# Patient Record
Sex: Female | Born: 1985 | Race: White | Hispanic: No | Marital: Married | Smoking: Current every day smoker
Health system: Southern US, Community
[De-identification: ages and names within clinical notes are randomized; demographics above are authoritative.]

## PROBLEM LIST (undated history)

## (undated) ENCOUNTER — Emergency Department (HOSPITAL_COMMUNITY): Admission: EM | Source: Home / Self Care

## (undated) ENCOUNTER — Inpatient Hospital Stay (HOSPITAL_COMMUNITY): Payer: Self-pay

## (undated) DIAGNOSIS — O09211 Supervision of pregnancy with history of pre-term labor, first trimester: Secondary | ICD-10-CM

## (undated) DIAGNOSIS — K861 Other chronic pancreatitis: Secondary | ICD-10-CM

## (undated) DIAGNOSIS — F1120 Opioid dependence, uncomplicated: Secondary | ICD-10-CM

## (undated) DIAGNOSIS — R109 Unspecified abdominal pain: Secondary | ICD-10-CM

## (undated) DIAGNOSIS — F32A Depression, unspecified: Secondary | ICD-10-CM

## (undated) DIAGNOSIS — R112 Nausea with vomiting, unspecified: Secondary | ICD-10-CM

## (undated) DIAGNOSIS — O4690 Antepartum hemorrhage, unspecified, unspecified trimester: Secondary | ICD-10-CM

## (undated) DIAGNOSIS — Z720 Tobacco use: Secondary | ICD-10-CM

## (undated) DIAGNOSIS — R519 Headache, unspecified: Secondary | ICD-10-CM

## (undated) DIAGNOSIS — K219 Gastro-esophageal reflux disease without esophagitis: Secondary | ICD-10-CM

## (undated) DIAGNOSIS — G8929 Other chronic pain: Secondary | ICD-10-CM

## (undated) DIAGNOSIS — K859 Acute pancreatitis without necrosis or infection, unspecified: Secondary | ICD-10-CM

## (undated) DIAGNOSIS — M869 Osteomyelitis, unspecified: Secondary | ICD-10-CM

## (undated) DIAGNOSIS — I10 Essential (primary) hypertension: Secondary | ICD-10-CM

## (undated) DIAGNOSIS — R51 Headache: Secondary | ICD-10-CM

## (undated) DIAGNOSIS — J189 Pneumonia, unspecified organism: Secondary | ICD-10-CM

## (undated) DIAGNOSIS — F419 Anxiety disorder, unspecified: Secondary | ICD-10-CM

## (undated) DIAGNOSIS — B977 Papillomavirus as the cause of diseases classified elsewhere: Secondary | ICD-10-CM

## (undated) DIAGNOSIS — R87629 Unspecified abnormal cytological findings in specimens from vagina: Secondary | ICD-10-CM

## (undated) DIAGNOSIS — F329 Major depressive disorder, single episode, unspecified: Secondary | ICD-10-CM

## (undated) DIAGNOSIS — D649 Anemia, unspecified: Secondary | ICD-10-CM

## (undated) DIAGNOSIS — K2970 Gastritis, unspecified, without bleeding: Secondary | ICD-10-CM

## (undated) HISTORY — DX: Osteomyelitis, unspecified: M86.9

## (undated) HISTORY — DX: Nausea with vomiting, unspecified: R11.2

## (undated) HISTORY — DX: Tobacco use: Z72.0

## (undated) HISTORY — PX: ANKLE SURGERY: SHX546

## (undated) HISTORY — PX: ORBITAL FRACTURE SURGERY: SHX725

## (undated) HISTORY — DX: Supervision of pregnancy with history of pre-term labor, first trimester: O09.211

## (undated) HISTORY — DX: Other chronic pancreatitis: K86.1

## (undated) HISTORY — DX: Anxiety disorder, unspecified: F41.9

## (undated) HISTORY — PX: KNEE SURGERY: SHX244

## (undated) HISTORY — DX: Unspecified abnormal cytological findings in specimens from vagina: R87.629

## (undated) HISTORY — DX: Acute pancreatitis without necrosis or infection, unspecified: K85.90

## (undated) HISTORY — DX: Antepartum hemorrhage, unspecified, unspecified trimester: O46.90

---
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
## 2007-04-03 ENCOUNTER — Other Ambulatory Visit: Admission: RE | Admit: 2007-04-03 | Discharge: 2007-04-03 | Payer: Self-pay | Admitting: Obstetrics and Gynecology

## 2007-05-10 ENCOUNTER — Other Ambulatory Visit: Admission: RE | Admit: 2007-05-10 | Discharge: 2007-05-10 | Payer: Self-pay | Admitting: Obstetrics and Gynecology

## 2007-08-02 ENCOUNTER — Emergency Department (HOSPITAL_COMMUNITY): Admission: EM | Admit: 2007-08-02 | Discharge: 2007-08-03 | Payer: Self-pay | Admitting: Emergency Medicine

## 2008-02-08 ENCOUNTER — Emergency Department (HOSPITAL_COMMUNITY): Admission: EM | Admit: 2008-02-08 | Discharge: 2008-02-08 | Payer: Self-pay | Admitting: Emergency Medicine

## 2008-02-19 ENCOUNTER — Inpatient Hospital Stay (HOSPITAL_COMMUNITY): Admission: EM | Admit: 2008-02-19 | Discharge: 2008-02-26 | Payer: Self-pay | Admitting: Emergency Medicine

## 2008-02-21 ENCOUNTER — Ambulatory Visit: Payer: Self-pay | Admitting: Physical Medicine & Rehabilitation

## 2008-03-16 ENCOUNTER — Inpatient Hospital Stay (HOSPITAL_COMMUNITY): Admission: EM | Admit: 2008-03-16 | Discharge: 2008-03-20 | Payer: Self-pay | Admitting: Emergency Medicine

## 2008-03-17 ENCOUNTER — Ambulatory Visit: Payer: Self-pay | Admitting: Internal Medicine

## 2008-04-05 ENCOUNTER — Inpatient Hospital Stay (HOSPITAL_COMMUNITY): Admission: RE | Admit: 2008-04-05 | Discharge: 2008-04-10 | Payer: Self-pay | Admitting: Orthopedic Surgery

## 2008-04-14 ENCOUNTER — Inpatient Hospital Stay (HOSPITAL_COMMUNITY): Admission: EM | Admit: 2008-04-14 | Discharge: 2008-04-18 | Payer: Self-pay | Admitting: Emergency Medicine

## 2008-05-14 ENCOUNTER — Ambulatory Visit (HOSPITAL_COMMUNITY): Admission: RE | Admit: 2008-05-14 | Discharge: 2008-05-14 | Payer: Self-pay | Admitting: Orthopedic Surgery

## 2008-05-28 ENCOUNTER — Encounter: Admission: RE | Admit: 2008-05-28 | Discharge: 2008-07-30 | Payer: Self-pay | Admitting: Orthopedic Surgery

## 2009-02-28 ENCOUNTER — Inpatient Hospital Stay (HOSPITAL_COMMUNITY): Admission: EM | Admit: 2009-02-28 | Discharge: 2009-03-04 | Payer: Self-pay | Admitting: Emergency Medicine

## 2009-03-01 ENCOUNTER — Ambulatory Visit: Payer: Self-pay | Admitting: Internal Medicine

## 2009-03-03 ENCOUNTER — Ambulatory Visit: Payer: Self-pay | Admitting: Internal Medicine

## 2009-03-04 ENCOUNTER — Encounter: Payer: Self-pay | Admitting: Internal Medicine

## 2009-04-02 ENCOUNTER — Encounter: Payer: Self-pay | Admitting: Internal Medicine

## 2009-04-08 ENCOUNTER — Encounter: Payer: Self-pay | Admitting: Internal Medicine

## 2009-04-16 ENCOUNTER — Inpatient Hospital Stay (HOSPITAL_COMMUNITY): Admission: EM | Admit: 2009-04-16 | Discharge: 2009-04-21 | Payer: Self-pay | Admitting: Emergency Medicine

## 2009-05-01 ENCOUNTER — Encounter: Payer: Self-pay | Admitting: Internal Medicine

## 2009-06-27 ENCOUNTER — Inpatient Hospital Stay (HOSPITAL_COMMUNITY): Admission: EM | Admit: 2009-06-27 | Discharge: 2009-07-02 | Payer: Self-pay | Admitting: Emergency Medicine

## 2010-05-10 ENCOUNTER — Encounter: Payer: Self-pay | Admitting: Family Medicine

## 2010-05-19 NOTE — Letter (Signed)
Summary: GI CLINIC APPT CONFIRMATION  GI CLINIC APPT CONFIRMATION   Imported By: Diana Eves 05/01/2009 09:28:15  _____________________________________________________________________  External Attachment:    Type:   Image     Comment:   External Document

## 2010-06-22 ENCOUNTER — Inpatient Hospital Stay (HOSPITAL_COMMUNITY)
Admission: EM | Admit: 2010-06-22 | Discharge: 2010-06-27 | DRG: 440 | Disposition: A | Discharge status: Home / Self Care | Attending: Internal Medicine | Admitting: Internal Medicine

## 2010-06-22 ENCOUNTER — Emergency Department (HOSPITAL_COMMUNITY)
Admission: EM | Admit: 2010-06-22 | Discharge: 2010-06-22 | Discharge status: Against Medical Advice | Attending: Emergency Medicine | Admitting: Emergency Medicine

## 2010-06-22 DIAGNOSIS — F411 Generalized anxiety disorder: Secondary | ICD-10-CM | POA: Diagnosis present

## 2010-06-22 DIAGNOSIS — G4700 Insomnia, unspecified: Secondary | ICD-10-CM | POA: Diagnosis present

## 2010-06-22 DIAGNOSIS — D649 Anemia, unspecified: Secondary | ICD-10-CM | POA: Diagnosis present

## 2010-06-22 DIAGNOSIS — R109 Unspecified abdominal pain: Secondary | ICD-10-CM | POA: Insufficient documentation

## 2010-06-22 DIAGNOSIS — K859 Acute pancreatitis without necrosis or infection, unspecified: Principal | ICD-10-CM | POA: Diagnosis present

## 2010-06-22 DIAGNOSIS — F17200 Nicotine dependence, unspecified, uncomplicated: Secondary | ICD-10-CM | POA: Diagnosis present

## 2010-06-22 LAB — COMPREHENSIVE METABOLIC PANEL
Albumin: 4.3 g/dL (ref 3.5–5.2)
Alkaline Phosphatase: 69 U/L (ref 39–117)
BUN: 6 mg/dL (ref 6–23)
Calcium: 9.7 mg/dL (ref 8.4–10.5)
Potassium: 3.6 mEq/L (ref 3.5–5.1)
Total Protein: 7.1 g/dL (ref 6.0–8.3)

## 2010-06-22 LAB — DIFFERENTIAL
Basophils Absolute: 0 10*3/uL (ref 0.0–0.1)
Eosinophils Absolute: 0.5 10*3/uL (ref 0.0–0.7)
Eosinophils Relative: 6 % — ABNORMAL HIGH (ref 0–5)
Lymphs Abs: 2.3 10*3/uL (ref 0.7–4.0)

## 2010-06-22 LAB — CBC
MCV: 90.2 fL (ref 78.0–100.0)
Platelets: 318 10*3/uL (ref 150–400)
RDW: 13.6 % (ref 11.5–15.5)
WBC: 8.3 10*3/uL (ref 4.0–10.5)

## 2010-06-23 LAB — URINE DRUGS OF ABUSE SCREEN W ALC, ROUTINE (REF LAB)
Benzodiazepines.: POSITIVE — AB
Creatinine,U: 159.3 mg/dL
Ethyl Alcohol: <10 mg/dL (ref <10)
Marijuana Metabolite: POSITIVE — AB
Propoxyphene: NEGATIVE

## 2010-06-23 LAB — COMPREHENSIVE METABOLIC PANEL
Albumin: 3.5 g/dL (ref 3.5–5.2)
Alkaline Phosphatase: 56 U/L (ref 39–117)
BUN: 6 mg/dL (ref 6–23)
CO2: 26 mEq/L (ref 19–32)
Chloride: 108 mEq/L (ref 96–112)
Creatinine, Ser: 0.57 mg/dL (ref 0.4–1.2)
GFR calc non Af Amer: >60 mL/min (ref >60)
Glucose, Bld: 91 mg/dL (ref 70–99)
Potassium: 3.5 mEq/L (ref 3.5–5.1)
Total Bilirubin: 0.5 mg/dL (ref 0.3–1.2)

## 2010-06-23 LAB — CBC
HCT: 32.7 % — ABNORMAL LOW (ref 36.0–46.0)
Hemoglobin: 10.9 g/dL — ABNORMAL LOW (ref 12.0–15.0)
MCH: 29.8 pg (ref 26.0–34.0)
MCV: 89.3 fL (ref 78.0–100.0)
RBC: 3.66 MIL/uL — ABNORMAL LOW (ref 3.87–5.11)
WBC: 6.5 10*3/uL (ref 4.0–10.5)

## 2010-06-23 LAB — URINALYSIS, ROUTINE W REFLEX MICROSCOPIC
Bilirubin Urine: NEGATIVE
Nitrite: NEGATIVE
Specific Gravity, Urine: 1.021 (ref 1.005–1.030)
pH: 6 (ref 5.0–8.0)

## 2010-06-23 LAB — URINE MICROSCOPIC-ADD ON

## 2010-06-23 LAB — TSH: TSH: 2.188 u[IU]/mL (ref 0.350–4.500)

## 2010-06-23 LAB — LIPID PANEL
Cholesterol: 169 mg/dL (ref 0–200)
LDL Cholesterol: 122 mg/dL — ABNORMAL HIGH (ref 0–99)
Triglycerides: 79 mg/dL (ref <150)
VLDL: 16 mg/dL (ref 0–40)

## 2010-06-23 LAB — GLUCOSE, CAPILLARY
Glucose-Capillary: 122 mg/dL — ABNORMAL HIGH (ref 70–99)
Glucose-Capillary: 96 mg/dL (ref 70–99)

## 2010-06-23 LAB — POCT PREGNANCY, URINE: Preg Test, Ur: NEGATIVE

## 2010-06-24 ENCOUNTER — Inpatient Hospital Stay (HOSPITAL_COMMUNITY)

## 2010-06-24 DIAGNOSIS — K859 Acute pancreatitis without necrosis or infection, unspecified: Secondary | ICD-10-CM

## 2010-06-24 LAB — CBC
HCT: 31.8 % — ABNORMAL LOW (ref 36.0–46.0)
MCH: 29.5 pg (ref 26.0–34.0)
MCV: 88.6 fL (ref 78.0–100.0)
RBC: 3.59 MIL/uL — ABNORMAL LOW (ref 3.87–5.11)
WBC: 5 10*3/uL (ref 4.0–10.5)

## 2010-06-24 LAB — FERRITIN: Ferritin: 14 ng/mL (ref 10–291)

## 2010-06-24 LAB — COMPREHENSIVE METABOLIC PANEL
Alkaline Phosphatase: 56 U/L (ref 39–117)
BUN: 2 mg/dL — ABNORMAL LOW (ref 6–23)
Chloride: 102 mEq/L (ref 96–112)
GFR calc non Af Amer: >60 mL/min (ref >60)
Glucose, Bld: 79 mg/dL (ref 70–99)
Potassium: 3.5 mEq/L (ref 3.5–5.1)
Total Bilirubin: 0.5 mg/dL (ref 0.3–1.2)

## 2010-06-24 LAB — GLUCOSE, CAPILLARY
Glucose-Capillary: 126 mg/dL — ABNORMAL HIGH (ref 70–99)
Glucose-Capillary: 90 mg/dL (ref 70–99)

## 2010-06-24 LAB — IRON AND TIBC
Iron: 51 ug/dL (ref 42–135)
TIBC: 279 ug/dL (ref 250–470)

## 2010-06-24 LAB — FOLATE: Folate: 9.4 ng/mL

## 2010-06-24 LAB — VITAMIN B12: Vitamin B-12: 405 pg/mL (ref 211–911)

## 2010-06-24 MED ORDER — GADOBENATE DIMEGLUMINE 529 MG/ML IV SOLN
10.0000 mL | Freq: Once | INTRAVENOUS | Status: AC
  Administered 2010-06-24: 10 mL via INTRAVENOUS

## 2010-06-25 DIAGNOSIS — K859 Acute pancreatitis without necrosis or infection, unspecified: Secondary | ICD-10-CM

## 2010-06-25 LAB — GLUCOSE, CAPILLARY
Glucose-Capillary: 114 mg/dL — ABNORMAL HIGH (ref 70–99)
Glucose-Capillary: 119 mg/dL — ABNORMAL HIGH (ref 70–99)
Glucose-Capillary: 122 mg/dL — ABNORMAL HIGH (ref 70–99)

## 2010-06-25 LAB — OPIATE, QUANTITATIVE, URINE: Morphine, Confirm: NEGATIVE NG/ML

## 2010-06-25 LAB — BASIC METABOLIC PANEL
Calcium: 9 mg/dL (ref 8.4–10.5)
Chloride: 105 mEq/L (ref 96–112)
Creatinine, Ser: 0.64 mg/dL (ref 0.4–1.2)
GFR calc Af Amer: >60 mL/min (ref >60)

## 2010-06-25 LAB — BENZODIAZEPINE, QUANTITATIVE, URINE
Flurazepam GC/MS Conf: NEGATIVE NG/ML
Lorazepam UR QT: NEGATIVE NG/ML
Nordiazepam GC/MS Conf: NEGATIVE NG/ML
Oxazepam GC/MS Conf: NEGATIVE NG/ML

## 2010-06-25 LAB — LIPASE, BLOOD: Lipase: 41 U/L (ref 11–59)

## 2010-06-25 LAB — CBC
MCH: 29.8 pg (ref 26.0–34.0)
MCHC: 34 g/dL (ref 30.0–36.0)
Platelets: 271 10*3/uL (ref 150–400)
RBC: 3.82 MIL/uL — ABNORMAL LOW (ref 3.87–5.11)

## 2010-06-26 ENCOUNTER — Encounter (INDEPENDENT_AMBULATORY_CARE_PROVIDER_SITE_OTHER): Payer: Self-pay

## 2010-06-26 LAB — GLUCOSE, CAPILLARY: Glucose-Capillary: 147 mg/dL — ABNORMAL HIGH (ref 70–99)
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
## 2010-06-30 NOTE — Miscellaneous (Signed)
Summary: Referral  Clinical Lists Changes Referral made to [facility] for Pancreatic Divisim and possible stenting. 1st available appointment is 10/12/10 @1 :15pm. Patient set up to see Dr. Gates Rigg. Office number [phone]. Fax number [phone]. Jennye Moccasin notified of appointment date and time. If appointment needs to be sooner Dr. Arlyce Dice will have to speak directly with physician at [facility]. Orders: Added new Test order of [facility]) - Signed

## 2010-07-05 LAB — COMPREHENSIVE METABOLIC PANEL
AST: 14 U/L (ref 0–37)
Alkaline Phosphatase: 57 U/L (ref 39–117)
BUN: 1 mg/dL — ABNORMAL LOW (ref 6–23)
CO2: 31 mEq/L (ref 19–32)
Chloride: 104 mEq/L (ref 96–112)
Creatinine, Ser: 0.56 mg/dL (ref 0.4–1.2)
GFR calc Af Amer: >60 mL/min (ref >60)
GFR calc non Af Amer: >60 mL/min (ref >60)
Total Bilirubin: 0.6 mg/dL (ref 0.3–1.2)

## 2010-07-05 LAB — DIFFERENTIAL
Basophils Absolute: 0 10*3/uL (ref 0.0–0.1)
Basophils Relative: 1 % (ref 0–1)
Eosinophils Absolute: 0.2 10*3/uL (ref 0.0–0.7)
Eosinophils Relative: 4 % (ref 0–5)
Lymphocytes Relative: 33 % (ref 12–46)

## 2010-07-05 LAB — CBC
HCT: 32.9 % — ABNORMAL LOW (ref 36.0–46.0)
MCV: 88.8 fL (ref 78.0–100.0)
RBC: 3.71 MIL/uL — ABNORMAL LOW (ref 3.87–5.11)
WBC: 4.6 10*3/uL (ref 4.0–10.5)

## 2010-07-05 LAB — LIPASE, BLOOD: Lipase: 93 U/L — ABNORMAL HIGH (ref 11–59)

## 2010-07-12 LAB — BASIC METABOLIC PANEL
BUN: 1 mg/dL — ABNORMAL LOW (ref 6–23)
BUN: 8 mg/dL (ref 6–23)
CO2: 23 mEq/L (ref 19–32)
CO2: 27 mEq/L (ref 19–32)
CO2: 28 mEq/L (ref 19–32)
Calcium: 9.2 mg/dL (ref 8.4–10.5)
Calcium: 9.5 mg/dL (ref 8.4–10.5)
Calcium: 9.8 mg/dL (ref 8.4–10.5)
Chloride: 103 mEq/L (ref 96–112)
Chloride: 104 mEq/L (ref 96–112)
Creatinine, Ser: 0.58 mg/dL (ref 0.4–1.2)
Creatinine, Ser: 0.58 mg/dL (ref 0.4–1.2)
GFR calc Af Amer: >60 mL/min (ref >60)
GFR calc Af Amer: >60 mL/min (ref >60)
GFR calc Af Amer: >60 mL/min (ref >60)
GFR calc Af Amer: >60 mL/min (ref >60)
Glucose, Bld: 112 mg/dL — ABNORMAL HIGH (ref 70–99)
Glucose, Bld: 96 mg/dL (ref 70–99)
Glucose, Bld: 98 mg/dL (ref 70–99)
Potassium: 4.1 mEq/L (ref 3.5–5.1)
Sodium: 138 mEq/L (ref 135–145)
Sodium: 140 mEq/L (ref 135–145)

## 2010-07-12 LAB — LIPASE, BLOOD
Lipase: 297 U/L — ABNORMAL HIGH (ref 11–59)
Lipase: 659 U/L — ABNORMAL HIGH (ref 11–59)

## 2010-07-12 LAB — URINALYSIS, ROUTINE W REFLEX MICROSCOPIC
Bilirubin Urine: NEGATIVE
Hgb urine dipstick: NEGATIVE
Nitrite: NEGATIVE
Specific Gravity, Urine: 1.02 (ref 1.005–1.030)
pH: 6.5 (ref 5.0–8.0)

## 2010-07-12 LAB — DIFFERENTIAL
Basophils Absolute: 0 10*3/uL (ref 0.0–0.1)
Basophils Absolute: 0 10*3/uL (ref 0.0–0.1)
Basophils Absolute: 0 10*3/uL (ref 0.0–0.1)
Basophils Relative: 0 % (ref 0–1)
Basophils Relative: 0 % (ref 0–1)
Eosinophils Absolute: 0 10*3/uL (ref 0.0–0.7)
Eosinophils Absolute: 0.2 10*3/uL (ref 0.0–0.7)
Eosinophils Relative: 4 % (ref 0–5)
Lymphocytes Relative: 26 % (ref 12–46)
Lymphs Abs: 2 10*3/uL (ref 0.7–4.0)
Monocytes Absolute: 0.7 10*3/uL (ref 0.1–1.0)
Monocytes Relative: 10 % (ref 3–12)
Neutro Abs: 10.3 10*3/uL — ABNORMAL HIGH (ref 1.7–7.7)
Neutro Abs: 3.9 10*3/uL (ref 1.7–7.7)
Neutrophils Relative %: 70 % (ref 43–77)
Neutrophils Relative %: 83 % — ABNORMAL HIGH (ref 43–77)

## 2010-07-12 LAB — CBC
Hemoglobin: 12.6 g/dL (ref 12.0–15.0)
MCHC: 34.3 g/dL (ref 30.0–36.0)
MCHC: 34.5 g/dL (ref 30.0–36.0)
MCV: 90.2 fL (ref 78.0–100.0)
Platelets: 221 10*3/uL (ref 150–400)
Platelets: 306 10*3/uL (ref 150–400)
RBC: 4.03 MIL/uL (ref 3.87–5.11)
RDW: 14.1 % (ref 11.5–15.5)
RDW: 14.5 % (ref 11.5–15.5)
WBC: 6.5 10*3/uL (ref 4.0–10.5)

## 2010-07-12 LAB — HEPATIC FUNCTION PANEL
AST: 19 U/L (ref 0–37)
Albumin: 4.9 g/dL (ref 3.5–5.2)
Alkaline Phosphatase: 90 U/L (ref 39–117)
Total Bilirubin: 0.6 mg/dL (ref 0.3–1.2)

## 2010-07-12 LAB — COMPREHENSIVE METABOLIC PANEL
ALT: 15 U/L (ref 0–35)
AST: 21 U/L (ref 0–37)
Albumin: 4 g/dL (ref 3.5–5.2)
Alkaline Phosphatase: 62 U/L (ref 39–117)
Alkaline Phosphatase: 71 U/L (ref 39–117)
CO2: 28 mEq/L (ref 19–32)
Calcium: 8.8 mg/dL (ref 8.4–10.5)
Chloride: 100 mEq/L (ref 96–112)
Chloride: 102 mEq/L (ref 96–112)
Creatinine, Ser: 0.53 mg/dL (ref 0.4–1.2)
GFR calc Af Amer: >60 mL/min (ref >60)
GFR calc non Af Amer: >60 mL/min (ref >60)
Glucose, Bld: 105 mg/dL — ABNORMAL HIGH (ref 70–99)
Potassium: 3.5 mEq/L (ref 3.5–5.1)
Sodium: 136 mEq/L (ref 135–145)
Total Bilirubin: 0.4 mg/dL (ref 0.3–1.2)
Total Bilirubin: 0.8 mg/dL (ref 0.3–1.2)
Total Protein: 6.8 g/dL (ref 6.0–8.3)

## 2010-07-12 LAB — PREGNANCY, URINE: Preg Test, Ur: NEGATIVE

## 2010-07-15 ENCOUNTER — Inpatient Hospital Stay: Admitting: Gastroenterology

## 2010-07-20 LAB — COMPREHENSIVE METABOLIC PANEL
ALT: 12 U/L (ref 0–35)
CO2: 24 mEq/L (ref 19–32)
Calcium: 9.2 mg/dL (ref 8.4–10.5)
GFR calc non Af Amer: >60 mL/min (ref >60)
Glucose, Bld: 98 mg/dL (ref 70–99)
Sodium: 138 mEq/L (ref 135–145)
Total Bilirubin: 0.4 mg/dL (ref 0.3–1.2)

## 2010-07-20 LAB — DIFFERENTIAL
Basophils Absolute: 0 10*3/uL (ref 0.0–0.1)
Basophils Relative: 0 % (ref 0–1)
Eosinophils Absolute: 0.2 10*3/uL (ref 0.0–0.7)
Eosinophils Relative: 1 % (ref 0–5)
Lymphocytes Relative: 14 % (ref 12–46)
Lymphs Abs: 1.4 10*3/uL (ref 0.7–4.0)
Lymphs Abs: 2.8 10*3/uL (ref 0.7–4.0)
Monocytes Relative: 9 % (ref 3–12)
Neutrophils Relative %: 56 % (ref 43–77)
Neutrophils Relative %: 76 % (ref 43–77)

## 2010-07-20 LAB — URINALYSIS, ROUTINE W REFLEX MICROSCOPIC
Glucose, UA: NEGATIVE mg/dL
Ketones, ur: 15 mg/dL — AB
Nitrite: NEGATIVE
Specific Gravity, Urine: 1.03 (ref 1.005–1.030)
pH: 6 (ref 5.0–8.0)

## 2010-07-20 LAB — PREGNANCY, URINE: Preg Test, Ur: NEGATIVE

## 2010-07-20 LAB — LIPASE, BLOOD
Lipase: 1008 U/L — ABNORMAL HIGH (ref 11–59)
Lipase: 1260 U/L — ABNORMAL HIGH (ref 11–59)
Lipase: 160 U/L — ABNORMAL HIGH (ref 11–59)

## 2010-07-20 LAB — CBC
HCT: 42.5 % (ref 36.0–46.0)
Hemoglobin: 12 g/dL (ref 12.0–15.0)
MCHC: 34.1 g/dL (ref 30.0–36.0)
MCV: 90.4 fL (ref 78.0–100.0)
Platelets: 321 10*3/uL (ref 150–400)
RBC: 3.89 MIL/uL (ref 3.87–5.11)
RBC: 4.71 MIL/uL (ref 3.87–5.11)
WBC: 9.5 10*3/uL (ref 4.0–10.5)

## 2010-07-20 LAB — HEPATIC FUNCTION PANEL
ALT: 16 U/L (ref 0–35)
AST: 16 U/L (ref 0–37)
Alkaline Phosphatase: 76 U/L (ref 39–117)
Total Protein: 7.6 g/dL (ref 6.0–8.3)

## 2010-07-20 LAB — BASIC METABOLIC PANEL
BUN: 11 mg/dL (ref 6–23)
Chloride: 98 mEq/L (ref 96–112)
GFR calc Af Amer: >60 mL/min (ref >60)
GFR calc non Af Amer: >60 mL/min (ref >60)
Potassium: 3.5 mEq/L (ref 3.5–5.1)

## 2010-07-20 LAB — LIPID PANEL
Cholesterol: 157 mg/dL (ref 0–200)
HDL: 32 mg/dL — ABNORMAL LOW (ref >39)
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
## 2010-07-22 ENCOUNTER — Encounter: Payer: Self-pay | Admitting: Gastroenterology

## 2010-07-22 ENCOUNTER — Ambulatory Visit (INDEPENDENT_AMBULATORY_CARE_PROVIDER_SITE_OTHER): Admitting: Gastroenterology

## 2010-07-22 VITALS — BP 124/83 | HR 83 | Temp 99.4°F | Ht 64.5 in | Wt 112.0 lb

## 2010-07-22 DIAGNOSIS — D649 Anemia, unspecified: Secondary | ICD-10-CM

## 2010-07-22 DIAGNOSIS — K859 Acute pancreatitis without necrosis or infection, unspecified: Secondary | ICD-10-CM

## 2010-07-22 DIAGNOSIS — Q453 Other congenital malformations of pancreas and pancreatic duct: Secondary | ICD-10-CM

## 2010-07-22 DIAGNOSIS — K861 Other chronic pancreatitis: Secondary | ICD-10-CM

## 2010-07-22 LAB — AMYLASE
Amylase: 520 U/L — ABNORMAL HIGH (ref 27–131)
Amylase: 631 U/L — ABNORMAL HIGH (ref 27–131)

## 2010-07-22 LAB — COMPREHENSIVE METABOLIC PANEL
ALT: 10 U/L (ref 0–35)
ALT: 9 U/L (ref 0–35)
AST: 10 U/L (ref 0–37)
AST: 14 U/L (ref 0–37)
Albumin: 3.5 g/dL (ref 3.5–5.2)
Albumin: 3.6 g/dL (ref 3.5–5.2)
Alkaline Phosphatase: 60 U/L (ref 39–117)
BUN: <6 mg/dL — ABNORMAL LOW (ref 6–23)
CO2: 25 mEq/L (ref 19–32)
CO2: 30 mEq/L (ref 19–32)
Chloride: 103 mEq/L (ref 96–112)
Chloride: 104 mEq/L (ref 96–112)
Creatinine, Ser: 0.57 mg/dL (ref 0.4–1.2)
GFR calc Af Amer: >60 mL/min (ref >60)
GFR calc Af Amer: >60 mL/min (ref >60)
GFR calc non Af Amer: >60 mL/min (ref >60)
GFR calc non Af Amer: >60 mL/min (ref >60)
GFR calc non Af Amer: >60 mL/min (ref >60)
Glucose, Bld: 116 mg/dL — ABNORMAL HIGH (ref 70–99)
Glucose, Bld: 73 mg/dL (ref 70–99)
Potassium: 3.2 mEq/L — ABNORMAL LOW (ref 3.5–5.1)
Potassium: 4.2 mEq/L (ref 3.5–5.1)
Sodium: 136 mEq/L (ref 135–145)
Sodium: 141 mEq/L (ref 135–145)
Total Bilirubin: 0.6 mg/dL (ref 0.3–1.2)
Total Bilirubin: 0.8 mg/dL (ref 0.3–1.2)
Total Protein: 7.5 g/dL (ref 6.0–8.3)

## 2010-07-22 LAB — DIFFERENTIAL
Basophils Absolute: 0 10*3/uL (ref 0.0–0.1)
Basophils Absolute: 0 10*3/uL (ref 0.0–0.1)
Basophils Relative: 0 % (ref 0–1)
Eosinophils Absolute: 0.2 10*3/uL (ref 0.0–0.7)
Eosinophils Absolute: 0.3 10*3/uL (ref 0.0–0.7)
Eosinophils Relative: 4 % (ref 0–5)
Eosinophils Relative: 4 % (ref 0–5)
Lymphocytes Relative: 16 % (ref 12–46)
Lymphs Abs: 2.7 10*3/uL (ref 0.7–4.0)
Monocytes Absolute: 0.7 10*3/uL (ref 0.1–1.0)
Monocytes Absolute: 0.8 10*3/uL (ref 0.1–1.0)
Monocytes Relative: 12 % (ref 3–12)
Neutro Abs: 4.7 10*3/uL (ref 1.7–7.7)
Neutrophils Relative %: 53 % (ref 43–77)
Neutrophils Relative %: 69 % (ref 43–77)

## 2010-07-22 LAB — BASIC METABOLIC PANEL
Chloride: 107 mEq/L (ref 96–112)
GFR calc Af Amer: >60 mL/min (ref >60)
GFR calc non Af Amer: >60 mL/min (ref >60)
Potassium: 3.6 mEq/L (ref 3.5–5.1)
Sodium: 141 mEq/L (ref 135–145)

## 2010-07-22 LAB — CBC
HCT: 36.5 % (ref 36.0–46.0)
Hemoglobin: 14.3 g/dL (ref 12.0–15.0)
MCV: 90.9 fL (ref 78.0–100.0)
Platelets: 193 10*3/uL (ref 150–400)
Platelets: 194 10*3/uL (ref 150–400)
RBC: 4.39 MIL/uL (ref 3.87–5.11)
RBC: 4.6 MIL/uL (ref 3.87–5.11)
RDW: 13.4 % (ref 11.5–15.5)
WBC: 6 10*3/uL (ref 4.0–10.5)
WBC: 6.7 10*3/uL (ref 4.0–10.5)

## 2010-07-22 LAB — LIPASE, BLOOD
Lipase: 171 U/L — ABNORMAL HIGH (ref 11–59)
Lipase: 276 U/L — ABNORMAL HIGH (ref 11–59)
Lipase: 908 U/L — ABNORMAL HIGH (ref 11–59)

## 2010-07-22 LAB — URINALYSIS, ROUTINE W REFLEX MICROSCOPIC
Nitrite: NEGATIVE
Specific Gravity, Urine: >1.03 — ABNORMAL HIGH (ref 1.005–1.030)
pH: 6 (ref 5.0–8.0)

## 2010-07-22 LAB — MAGNESIUM: Magnesium: 2.4 mg/dL (ref 1.5–2.5)

## 2010-07-22 LAB — URINE MICROSCOPIC-ADD ON

## 2010-07-22 LAB — TRIGLYCERIDES: Triglycerides: 89 mg/dL (ref <150)

## 2010-07-22 LAB — RAPID URINE DRUG SCREEN, HOSP PERFORMED: Barbiturates: NOT DETECTED

## 2010-07-22 LAB — PREGNANCY, URINE: Preg Test, Ur: NEGATIVE

## 2010-07-22 MED ORDER — OXYCODONE-ACETAMINOPHEN 10-325 MG PO TABS: 1.0000 | ORAL_TABLET | Freq: Four times a day (QID) | ORAL | Status: DC | PRN

## 2010-07-22 MED ORDER — FERROUS SULFATE 325 (65 FE) MG PO TABS: 325.0000 mg | ORAL_TABLET | Freq: Every day | ORAL | Status: DC

## 2010-07-22 NOTE — Patient Instructions (Signed)
We are making arrangements for you to go to [facility] for f/u of pancreas divisum. Please complete ifobt stool test. CBC in six weeks. Take ferrous sulfate (iron) 325mg  po daily.
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
## 2010-07-23 ENCOUNTER — Encounter: Payer: Self-pay | Admitting: Gastroenterology

## 2010-07-23 DIAGNOSIS — D649 Anemia, unspecified: Secondary | ICD-10-CM | POA: Insufficient documentation

## 2010-07-23 DIAGNOSIS — Q453 Other congenital malformations of pancreas and pancreatic duct: Secondary | ICD-10-CM | POA: Insufficient documentation

## 2010-07-23 DIAGNOSIS — K859 Acute pancreatitis without necrosis or infection, unspecified: Secondary | ICD-10-CM | POA: Insufficient documentation

## 2010-07-23 NOTE — Assessment & Plan Note (Signed)
First episode of acute pancreatitis in 2009. In 2010 with her second episode she had an MRCP was suggestive of pancreas divisum. We referred her to [facility] and it sounds like they were planning on an ERCP but the patient had to move. We'll arrange for referral back, previously saw Dr. Fredric Mare. In the meantime will provide her with supportive measures including anti-emetics and pain medication to have on hand. She will call with any recurrent symptoms in the meantime.

## 2010-07-23 NOTE — Assessment & Plan Note (Signed)
See recurrent pancreatitis below.

## 2010-07-23 NOTE — Assessment & Plan Note (Signed)
Normocytic anemia. No overt GI bleeding. Describes irregular menses but notes that she had heavy cycle prior to recent hospitalization. She bled so much that she actually went to urgent care for evaluation. Will check stool for occult blood. Recheck CBC in 6 weeks.

## 2010-07-26 NOTE — Progress Notes (Signed)
Reviewed by R. Michael Rourk, MD FACP FACG 

## 2010-07-27 ENCOUNTER — Inpatient Hospital Stay (HOSPITAL_COMMUNITY)
Admission: EM | Admit: 2010-07-27 | Discharge: 2010-08-02 | DRG: 440 | Disposition: A | Discharge status: Home / Self Care | Attending: Internal Medicine | Admitting: Internal Medicine

## 2010-07-27 DIAGNOSIS — D509 Iron deficiency anemia, unspecified: Secondary | ICD-10-CM | POA: Diagnosis present

## 2010-07-27 DIAGNOSIS — K219 Gastro-esophageal reflux disease without esophagitis: Secondary | ICD-10-CM | POA: Diagnosis present

## 2010-07-27 DIAGNOSIS — F411 Generalized anxiety disorder: Secondary | ICD-10-CM | POA: Diagnosis present

## 2010-07-27 DIAGNOSIS — G4700 Insomnia, unspecified: Secondary | ICD-10-CM | POA: Diagnosis present

## 2010-07-27 DIAGNOSIS — K859 Acute pancreatitis without necrosis or infection, unspecified: Principal | ICD-10-CM | POA: Diagnosis present

## 2010-07-27 DIAGNOSIS — K8689 Other specified diseases of pancreas: Secondary | ICD-10-CM | POA: Diagnosis present

## 2010-07-27 DIAGNOSIS — F17200 Nicotine dependence, unspecified, uncomplicated: Secondary | ICD-10-CM | POA: Diagnosis present

## 2010-07-27 LAB — URINALYSIS, ROUTINE W REFLEX MICROSCOPIC
Bilirubin Urine: NEGATIVE
Ketones, ur: 15 mg/dL — AB
Nitrite: NEGATIVE
Protein, ur: NEGATIVE mg/dL
Urobilinogen, UA: 0.2 mg/dL (ref 0.0–1.0)
pH: 6 (ref 5.0–8.0)

## 2010-07-27 LAB — DIFFERENTIAL
Basophils Relative: 0 % (ref 0–1)
Eosinophils Absolute: 0.1 10*3/uL (ref 0.0–0.7)
Lymphs Abs: 1.6 10*3/uL (ref 0.7–4.0)
Monocytes Absolute: 0.6 10*3/uL (ref 0.1–1.0)
Monocytes Relative: 9 % (ref 3–12)
Neutro Abs: 4.5 10*3/uL (ref 1.7–7.7)
Neutrophils Relative %: 65 % (ref 43–77)

## 2010-07-27 LAB — CBC
Hemoglobin: 12.4 g/dL (ref 12.0–15.0)
MCH: 29.2 pg (ref 26.0–34.0)
MCHC: 34.3 g/dL (ref 30.0–36.0)
MCV: 84.9 fL (ref 78.0–100.0)
RBC: 4.25 MIL/uL (ref 3.87–5.11)

## 2010-07-27 LAB — COMPREHENSIVE METABOLIC PANEL
Albumin: 4.3 g/dL (ref 3.5–5.2)
Alkaline Phosphatase: 64 U/L (ref 39–117)
BUN: 12 mg/dL (ref 6–23)
CO2: 25 mEq/L (ref 19–32)
Chloride: 100 mEq/L (ref 96–112)
Creatinine, Ser: 0.5 mg/dL (ref 0.4–1.2)
GFR calc non Af Amer: >60 mL/min (ref >60)
Glucose, Bld: 138 mg/dL — ABNORMAL HIGH (ref 70–99)
Potassium: 3.7 mEq/L (ref 3.5–5.1)
Total Bilirubin: 0.4 mg/dL (ref 0.3–1.2)

## 2010-07-27 LAB — LIPASE, BLOOD: Lipase: 161 U/L — ABNORMAL HIGH (ref 11–59)

## 2010-07-27 LAB — PREGNANCY, URINE: Preg Test, Ur: NEGATIVE

## 2010-07-27 LAB — AMYLASE: Amylase: 119 U/L — ABNORMAL HIGH (ref 0–105)

## 2010-07-28 LAB — RAPID URINE DRUG SCREEN, HOSP PERFORMED
Barbiturates: NOT DETECTED
Benzodiazepines: NOT DETECTED
Cocaine: NOT DETECTED

## 2010-07-28 LAB — COMPREHENSIVE METABOLIC PANEL
ALT: 10 U/L (ref 0–35)
AST: 13 U/L (ref 0–37)
CO2: 27 mEq/L (ref 19–32)
Chloride: 106 mEq/L (ref 96–112)
GFR calc Af Amer: >60 mL/min (ref >60)
GFR calc non Af Amer: >60 mL/min (ref >60)
Glucose, Bld: 109 mg/dL — ABNORMAL HIGH (ref 70–99)
Sodium: 138 mEq/L (ref 135–145)
Total Bilirubin: 0.3 mg/dL (ref 0.3–1.2)

## 2010-07-28 LAB — GLUCOSE, CAPILLARY
Glucose-Capillary: 123 mg/dL — ABNORMAL HIGH (ref 70–99)
Glucose-Capillary: 79 mg/dL (ref 70–99)
Glucose-Capillary: 80 mg/dL (ref 70–99)

## 2010-07-28 LAB — CBC
HCT: 32.3 % — ABNORMAL LOW (ref 36.0–46.0)
Hemoglobin: 10.9 g/dL — ABNORMAL LOW (ref 12.0–15.0)
MCH: 30.4 pg (ref 26.0–34.0)
RBC: 3.59 MIL/uL — ABNORMAL LOW (ref 3.87–5.11)

## 2010-07-28 LAB — DIFFERENTIAL
Basophils Absolute: 0 10*3/uL (ref 0.0–0.1)
Basophils Relative: 0 % (ref 0–1)
Lymphocytes Relative: 48 % — ABNORMAL HIGH (ref 12–46)
Monocytes Relative: 12 % (ref 3–12)
Neutro Abs: 2.2 10*3/uL (ref 1.7–7.7)
Neutrophils Relative %: 32 % — ABNORMAL LOW (ref 43–77)

## 2010-07-28 LAB — LIPID PANEL
LDL Cholesterol: 118 mg/dL — ABNORMAL HIGH (ref 0–99)
Triglycerides: 97 mg/dL (ref <150)

## 2010-07-28 LAB — CARDIAC PANEL(CRET KIN+CKTOT+MB+TROPI)
CK, MB: 0.5 ng/mL (ref 0.3–4.0)
Total CK: 27 U/L (ref 7–177)

## 2010-07-28 LAB — TSH: TSH: 2.137 u[IU]/mL (ref 0.350–4.500)

## 2010-07-28 LAB — LIPASE, BLOOD: Lipase: 129 U/L — ABNORMAL HIGH (ref 11–59)

## 2010-07-29 LAB — GLUCOSE, CAPILLARY
Glucose-Capillary: 110 mg/dL — ABNORMAL HIGH (ref 70–99)
Glucose-Capillary: 122 mg/dL — ABNORMAL HIGH (ref 70–99)
Glucose-Capillary: 94 mg/dL (ref 70–99)

## 2010-07-30 ENCOUNTER — Telehealth: Payer: Self-pay

## 2010-07-30 LAB — GLUCOSE, CAPILLARY
Glucose-Capillary: 102 mg/dL — ABNORMAL HIGH (ref 70–99)
Glucose-Capillary: 87 mg/dL (ref 70–99)
Glucose-Capillary: 92 mg/dL (ref 70–99)

## 2010-07-30 LAB — CBC
MCV: 89 fL (ref 78.0–100.0)
Platelets: 256 10*3/uL (ref 150–400)
RBC: 3.82 MIL/uL — ABNORMAL LOW (ref 3.87–5.11)
RDW: 13.3 % (ref 11.5–15.5)
WBC: 5.9 10*3/uL (ref 4.0–10.5)

## 2010-07-30 LAB — DIFFERENTIAL
Basophils Absolute: 0 10*3/uL (ref 0.0–0.1)
Eosinophils Absolute: 0.5 10*3/uL (ref 0.0–0.7)
Eosinophils Relative: 8 % — ABNORMAL HIGH (ref 0–5)
Lymphs Abs: 2.4 10*3/uL (ref 0.7–4.0)
Neutrophils Relative %: 38 % — ABNORMAL LOW (ref 43–77)

## 2010-07-30 LAB — BASIC METABOLIC PANEL
BUN: 1 mg/dL — ABNORMAL LOW (ref 6–23)
Chloride: 102 mEq/L (ref 96–112)
GFR calc Af Amer: >60 mL/min (ref >60)
GFR calc non Af Amer: >60 mL/min (ref >60)
Potassium: 3.9 mEq/L (ref 3.5–5.1)
Sodium: 134 mEq/L — ABNORMAL LOW (ref 135–145)

## 2010-07-30 LAB — LIPASE, BLOOD: Lipase: 83 U/L — ABNORMAL HIGH (ref 11–59)

## 2010-07-30 NOTE — Telephone Encounter (Signed)
Pt called at 5:15pm yesterday- left voicemail- stated she went to the ER on Monday and was admitted. Asking for LSL to call her on her cell. Number is [phone].

## 2010-07-30 NOTE — Telephone Encounter (Signed)
Tried to call the patient. Left message on her answering machine for return call.

## 2010-07-31 LAB — GLUCOSE, CAPILLARY
Glucose-Capillary: 170 mg/dL — ABNORMAL HIGH (ref 70–99)
Glucose-Capillary: 119 mg/dL — ABNORMAL HIGH (ref 70–99)
Glucose-Capillary: 138 mg/dL — ABNORMAL HIGH (ref 70–99)
Glucose-Capillary: 129 mg/dL — ABNORMAL HIGH (ref 70–99)

## 2010-07-31 LAB — COMPREHENSIVE METABOLIC PANEL
AST: 15 U/L (ref 0–37)
Albumin: 3.7 g/dL (ref 3.5–5.2)
Alkaline Phosphatase: 60 U/L (ref 39–117)
BUN: 1 mg/dL — ABNORMAL LOW (ref 6–23)
Chloride: 103 mEq/L (ref 96–112)
GFR calc Af Amer: >60 mL/min (ref >60)
Potassium: 4.1 mEq/L (ref 3.5–5.1)
Total Protein: 5.9 g/dL — ABNORMAL LOW (ref 6.0–8.3)

## 2010-08-01 LAB — GLUCOSE, CAPILLARY
Glucose-Capillary: 139 mg/dL — ABNORMAL HIGH (ref 70–99)
Glucose-Capillary: 122 mg/dL — ABNORMAL HIGH (ref 70–99)

## 2010-08-01 LAB — LIPASE, BLOOD: Lipase: 68 U/L — ABNORMAL HIGH (ref 11–59)

## 2010-08-02 LAB — GLUCOSE, CAPILLARY
Glucose-Capillary: 133 mg/dL — ABNORMAL HIGH (ref 70–99)
Glucose-Capillary: 129 mg/dL — ABNORMAL HIGH (ref 70–99)

## 2010-08-02 LAB — LIPASE, BLOOD: Lipase: 58 U/L (ref 11–59)

## 2010-08-03 LAB — CBC
HCT: 36.2 % (ref 36.0–46.0)
MCV: 89.9 fL (ref 78.0–100.0)
Platelets: 283 10*3/uL (ref 150–400)
RDW: 13.8 % (ref 11.5–15.5)

## 2010-08-04 NOTE — Discharge Summary (Signed)
NAME:  [name], [name] NO.:  [id number]  MEDICAL RECORD NO.:  [id number]           PATIENT TYPE:  I  LOCATION:  A325                          FACILITY:  [facility]  PHYSICIAN:  Elliot Cousin, M.D.    DATE OF BIRTH:  03/16/1986  DATE OF ADMISSION:  07/27/2010 DATE OF DISCHARGE:  04/15/2012LH                              DISCHARGE SUMMARY   DISCHARGE DIAGNOSES: 1. Acute pancreatitis, recurrent, secondary to pancreatic divisum. 2. Chronic anxiety disorder. 3. Chronic insomnia.  DISCHARGE MEDICATIONS: 1. Prilosec 20 mg daily. 2. Docusate 100 mg b.i.d. 3. Ibuprofen 800 mg 1 tablet 3 times daily as needed. 4. Percocet 10/325 mg 1 tablet every 6 hours as needed for pain. 5. Promethazine 25 mg 1 tablet 3 times daily as needed for nausea. 6. Xanax 0.5 mg 3 times daily as needed for anxiety. 7. Zolpidem 10 mg at bedtime.  DISCHARGE DISPOSITION:  The patient was discharged to home in improved and stable condition on August 02, 2010.  She will follow up with her new primary care physician, Dr. Janna Arch, on August 11, 2010.  She will follow up with Ms. Tana Coast, PA, for Dr. Jena Gauss and Dr. Darrick Penna in May 2012.  She also is scheduled to follow up with a gastroenterologist at [facility] in June 2012 for an endoscopic ultrasound.  PROCEDURES PERFORMED:  None.  CONSULTATIONS:  None.  HISTORY OF PRESENT ILLNESS:  The patient is a 25 year old woman with a past medical history significant for recurrent pancreatitis, pancreatic divisum, and chronic anxiety, who presented to the emergency department with a chief complaint of epigastric pain, nausea, and vomiting.  When she was initially evaluated, she was noted to be afebrile and hemodynamically stable.  Her WBC was within normal limits at 6.8.  Her amylase was elevated at 119.  Her lipase was elevated at 161.  Her liver transaminases were within normal limits.  She was admitted for  further evaluation and management.  HOSPITAL COURSE:  The patient was started on supportive treatment with IV fluids, as-needed Dilaudid, as-needed Zofran, and as-needed Phenergan.  For further evaluation, a number of studies were ordered. Her cardiac enzymes were within normal limits.  Her blood magnesium was within normal limits at 1.9.  Her fasting lipid profile revealed a total cholesterol of 164, triglycerides of 97, HDL cholesterol of 27, and LDL cholesterol of 118.  Her TSH was within normal limits at 2.137.  The patient's pain and nausea subsided.  Her diet was slowly advanced. She was restarted on Xanax as needed for chronic anxiety.  The dose of Dilaudid was decreased.  Her followup blood lipase improved Prior to discharge, it normalized to 58.  At the time of hospital discharge, she was pain free after eating a low-fat lunch.  She was hemodynamically stable.  She was discharged to home on prescriptions for Percocet, Xanax, and Ambien.  She will follow up with her new primary care physician, Dr. Janna Arch, on August 11, 2010.  She will follow up with her gastroenterologist (PA, Tana Coast) in May and with the gastroenterologist at [facility] in June for an endoscopic ultrasound per the patient's history.     Elliot Cousin, M.D.     DF/MEDQ  D:  08/02/2010  T:  08/02/2010  Job:  [id number]  cc:   Melvyn Novas, MD Fax: [phone]  Tana Coast, P.A.  Electronically Signed by Elliot Cousin M.D. on 08/04/2010 08:56:37 AM

## 2010-08-18 ENCOUNTER — Ambulatory Visit (INDEPENDENT_AMBULATORY_CARE_PROVIDER_SITE_OTHER): Admitting: Gastroenterology

## 2010-08-18 ENCOUNTER — Encounter: Payer: Self-pay | Admitting: Gastroenterology

## 2010-08-18 ENCOUNTER — Emergency Department (HOSPITAL_COMMUNITY)
Admission: EM | Admit: 2010-08-18 | Discharge: 2010-08-18 | Disposition: A | Discharge status: Home / Self Care | Attending: Emergency Medicine | Admitting: Emergency Medicine

## 2010-08-18 VITALS — BP 118/77 | HR 71 | Temp 98.5°F | Ht 64.0 in | Wt 109.2 lb

## 2010-08-18 DIAGNOSIS — K859 Acute pancreatitis without necrosis or infection, unspecified: Secondary | ICD-10-CM

## 2010-08-18 DIAGNOSIS — F17200 Nicotine dependence, unspecified, uncomplicated: Secondary | ICD-10-CM | POA: Insufficient documentation

## 2010-08-18 DIAGNOSIS — K861 Other chronic pancreatitis: Secondary | ICD-10-CM | POA: Insufficient documentation

## 2010-08-18 DIAGNOSIS — D649 Anemia, unspecified: Secondary | ICD-10-CM

## 2010-08-18 DIAGNOSIS — Z79899 Other long term (current) drug therapy: Secondary | ICD-10-CM | POA: Insufficient documentation

## 2010-08-18 DIAGNOSIS — R109 Unspecified abdominal pain: Secondary | ICD-10-CM | POA: Insufficient documentation

## 2010-08-18 LAB — COMPREHENSIVE METABOLIC PANEL
Albumin: 4.4 g/dL (ref 3.5–5.2)
BUN: 12 mg/dL (ref 6–23)
Chloride: 104 mEq/L (ref 96–112)
Creatinine, Ser: 0.6 mg/dL (ref 0.4–1.2)
Glucose, Bld: 101 mg/dL — ABNORMAL HIGH (ref 70–99)
Total Bilirubin: 0.2 mg/dL — ABNORMAL LOW (ref 0.3–1.2)

## 2010-08-18 LAB — CBC
HCT: 36.2 % (ref 36.0–46.0)
MCH: 30.1 pg (ref 26.0–34.0)
MCV: 88.7 fL (ref 78.0–100.0)
Platelets: 346 10*3/uL (ref 150–400)
RBC: 4.08 MIL/uL (ref 3.87–5.11)
RDW: 13.9 % (ref 11.5–15.5)

## 2010-08-18 LAB — DIFFERENTIAL
Eosinophils Absolute: 0.2 10*3/uL (ref 0.0–0.7)
Eosinophils Relative: 3 % (ref 0–5)
Lymphs Abs: 2.8 10*3/uL (ref 0.7–4.0)
Monocytes Relative: 8 % (ref 3–12)

## 2010-08-18 LAB — POCT PREGNANCY, URINE: Preg Test, Ur: NEGATIVE

## 2010-08-18 LAB — LIPASE, BLOOD: Lipase: 65 U/L — ABNORMAL HIGH (ref 11–59)

## 2010-08-18 MED ORDER — OXYCODONE-ACETAMINOPHEN 10-325 MG PO TABS: 1.0000 | ORAL_TABLET | Freq: Four times a day (QID) | ORAL | Status: DC | PRN

## 2010-08-18 MED ORDER — PROMETHAZINE HCL 25 MG PO TABS: 25.0000 mg | ORAL_TABLET | Freq: Three times a day (TID) | ORAL | Status: DC | PRN
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
## 2010-08-18 NOTE — Patient Instructions (Addendum)
Follow a clear liquid diet. Avoid high fat foods. Give your pancreas a chance to rest.  Take pain medication and nausea medication as needed. If this does not alleviate symptoms, you will need to seek medical attention.  Monitor for any increased abdominal pain, nausea or vomiting.   Keep the appointment at [facility] coming up.  You are being sent home with something to test your stool for any blood that can't be seen. Complete this after your symptoms have resolved and you are doing better. Please bring back to our office.  We will see you in 4 weeks.

## 2010-08-19 ENCOUNTER — Inpatient Hospital Stay: Admitting: Gastroenterology

## 2010-08-19 NOTE — Assessment & Plan Note (Signed)
Secondary to pancreas divisum variant. Last hospitalized April 9th-15th. Referral arranged already to [facility] for presumed ERCP, May 25th. Pt with symptoms consistent with recurrent pancreatitis, uncomplicated. Will hold off on lipase now, no CT needed unless worsens. Will provide supportive measures to include pain control, anti-emetics, and clear liquid diets; goal to avoid readmission. However, pt counseled on signs/symptoms that would necessitate medical attention. She is well aware of this and very familiar with this course.   Keep appt at [facility] To ED if symptoms worsen or do not improve

## 2010-08-19 NOTE — Assessment & Plan Note (Signed)
Hx of anemia, may be r/t heavy menses. Unable to complete ifobt due to recent admission. Once this acute episode is resolved, will obtain ifobt. Pt aware.

## 2010-08-20 ENCOUNTER — Other Ambulatory Visit: Payer: Self-pay | Admitting: Gastroenterology

## 2010-08-20 ENCOUNTER — Encounter: Payer: Self-pay | Admitting: Gastroenterology

## 2010-08-20 DIAGNOSIS — Q453 Other congenital malformations of pancreas and pancreatic duct: Secondary | ICD-10-CM

## 2010-08-20 MED ORDER — OMEPRAZOLE 20 MG PO CPDR: 20.0000 mg | DELAYED_RELEASE_CAPSULE | Freq: Every day | ORAL | Status: DC

## 2010-08-20 MED ORDER — PANCRELIPASE (LIP-PROT-AMYL) 12000-38000 UNITS PO CPEP: 2.0000 | ORAL_CAPSULE | Freq: Three times a day (TID) | ORAL | Status: DC

## 2010-08-20 NOTE — Progress Notes (Signed)
Pt seen in ED on 5/1, after being seen in our office. Lipase 65. Was given phenergan suppositories. According to pt, informed to "increase dose to 2 tabs every 4-6 hours". Pt given Percocet 10/325 # 30 tablets on 5/1. Pt requesting additional pain medication.  Discussed this case with Dr. Jena Gauss. Obviously, pt needs to be seen at [facility] for further work-up. This is scheduled for May 25th. We will hold off on additional narcotics at this present time. Pt is to take 1 tablet every 6 hours.  We will add a PPI and pancreatic enzymes in the interim. If pt experiences increasing pain, needs to seek medical attention.    Have pt come pick up:  Prilosec samples # 14, prescription as well.  Creon samples (12,000 units, take 2 with meals, 1 with snacks) 4 boxes. Due to pt's weight, will start at this dose and increase if necessary.  Follow clear liquid diet. To ED if symptoms worsen

## 2010-08-21 ENCOUNTER — Emergency Department (HOSPITAL_COMMUNITY)
Admission: EM | Admit: 2010-08-21 | Discharge: 2010-08-21 | Disposition: A | Discharge status: Home / Self Care | Attending: Emergency Medicine | Admitting: Emergency Medicine

## 2010-08-21 DIAGNOSIS — R109 Unspecified abdominal pain: Secondary | ICD-10-CM | POA: Insufficient documentation

## 2010-08-21 DIAGNOSIS — K861 Other chronic pancreatitis: Secondary | ICD-10-CM | POA: Insufficient documentation

## 2010-08-21 LAB — DIFFERENTIAL
Basophils Absolute: 0 10*3/uL (ref 0.0–0.1)
Basophils Relative: 1 % (ref 0–1)
Monocytes Absolute: 0.5 10*3/uL (ref 0.1–1.0)
Neutro Abs: 3.1 10*3/uL (ref 1.7–7.7)
Neutrophils Relative %: 55 % (ref 43–77)

## 2010-08-21 LAB — COMPREHENSIVE METABOLIC PANEL
ALT: 9 U/L (ref 0–35)
AST: 10 U/L (ref 0–37)
CO2: 30 mEq/L (ref 19–32)
Calcium: 10.4 mg/dL (ref 8.4–10.5)
GFR calc Af Amer: >60 mL/min (ref >60)
GFR calc non Af Amer: >60 mL/min (ref >60)
Sodium: 138 mEq/L (ref 135–145)

## 2010-08-21 LAB — CBC
Hemoglobin: 12.6 g/dL (ref 12.0–15.0)
MCHC: 33.8 g/dL (ref 30.0–36.0)
RBC: 4.18 MIL/uL (ref 3.87–5.11)

## 2010-08-21 LAB — URINALYSIS, ROUTINE W REFLEX MICROSCOPIC
Bilirubin Urine: NEGATIVE
Ketones, ur: NEGATIVE mg/dL
Nitrite: NEGATIVE
Protein, ur: NEGATIVE mg/dL
Urobilinogen, UA: 0.2 mg/dL (ref 0.0–1.0)
pH: 6.5 (ref 5.0–8.0)

## 2010-08-21 LAB — POCT PREGNANCY, URINE: Preg Test, Ur: NEGATIVE

## 2010-08-24 ENCOUNTER — Ambulatory Visit (INDEPENDENT_AMBULATORY_CARE_PROVIDER_SITE_OTHER): Admitting: Gastroenterology

## 2010-08-24 ENCOUNTER — Encounter: Payer: Self-pay | Admitting: Gastroenterology

## 2010-08-24 VITALS — BP 114/73 | HR 123 | Temp 99.1°F | Ht 65.0 in | Wt 106.4 lb

## 2010-08-24 DIAGNOSIS — Q453 Other congenital malformations of pancreas and pancreatic duct: Secondary | ICD-10-CM

## 2010-08-24 DIAGNOSIS — K859 Acute pancreatitis without necrosis or infection, unspecified: Secondary | ICD-10-CM

## 2010-08-24 DIAGNOSIS — K861 Other chronic pancreatitis: Secondary | ICD-10-CM

## 2010-08-24 DIAGNOSIS — IMO0002 Reserved for concepts with insufficient information to code with codable children: Secondary | ICD-10-CM

## 2010-08-24 MED ORDER — OXYCODONE-ACETAMINOPHEN 10-325 MG PO TABS: 1.0000 | ORAL_TABLET | Freq: Four times a day (QID) | ORAL | Status: DC | PRN
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
## 2010-08-24 NOTE — Progress Notes (Signed)
Cc to PCP 

## 2010-08-24 NOTE — Patient Instructions (Signed)
Obtain labs. We will contact you with results.  If you are unable to drink fluids, develop a fever, have worsening of abdominal pain, you will need to seek medical attention.  Continue Prilosec daily  Start Creon 12,000 units, 2 caps before each meal, 1 before each snack.  Follow a clear liquid diet for now.

## 2010-08-24 NOTE — Assessment & Plan Note (Signed)
Hx of recurrent pancreatitis thought to be secondary to pancreas divisum variant. Set up for appt at [facility] in June. Needs sooner appt if at all necessary. Presents again today with reoccurrence of epigastric pain. States started "after pain meds gone". Last Lipase on file 64 a few days ago. Slight elevation of temp, was tachycardic in 120s on original vitals (by machine). Checked again manually and was in the 90s. Due to continued pain, will draw stat CBC, CMP, lipase. Start Creon. Continue Prilosec. Clear liquid diet. Short course of percocet. Monitor for increased temperature, abdominal pain, N/V. Will also obtain drug screen. Further recommendations after labs completed. Pt is aware that if she does not improve, may need to be admitted, especially if unable to stay hydrated.

## 2010-08-25 LAB — DRUG SCREEN, URINE
Barbiturate Quant, Ur: NEGATIVE
Cocaine Metabolites: NEGATIVE
Creatinine,U: 53.9 mg/dL
Methadone: NEGATIVE
Opiates: NEGATIVE
Propoxyphene: NEGATIVE
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
## 2010-09-01 NOTE — Op Note (Signed)
[name]               ACCOUNT NO.:  [id number]   MEDICAL RECORD NO.:  [id number]          PATIENT TYPE:  INP   LOCATION:  5024                         FACILITY:  [facility]   PHYSICIAN:  Doralee Albino. Carola Frost, M.D. DATE OF BIRTH:  1986/04/18   DATE OF PROCEDURE:  03/18/2008  DATE OF DISCHARGE:                               OPERATIVE REPORT   PREOPERATIVE DIAGNOSES:  1. Right septic knee joint.  2. Postoperative wound infection, right lateral leg.   POSTOPERATIVE DIAGNOSES:  1. Right septic knee joint.  2. Postoperative wound infection, right lateral leg.   PROCEDURES:  1. Repeat irrigation and debridement of right knee joint.  2. Irrigation and debridement of right proximal tibia and anterior      compartment incision laterally.   SURGEON:  Doralee Albino. Carola Frost, MD   ASSISTANT:  Mearl Latin, PA   ANESTHESIA:  General.   COMPLICATIONS:  None.   SPECIMENS:  None.   DISPOSITION:  To PACU.   CONDITION:  Stable.   DRAINS:  Two, 1 in the knee joint and 1 over the separate lateral  proximal tibial incision.   BRIEF SUMMARY/INDICATION FOR PROCEDURE:  [name] is a very  pleasant 25 year old female status post bilateral tibial plateau ORIF  who was seen and evaluated by one of my partners, Dr. Chaney Malling, over  the weekend and took her to the OR for an emergent I and D of her septic  knee as well as washout of the lateral proximal tibial wound.  She had  packing placed at that time and now presents for repeat I and D and  probable closure.  I discussed with her risks and benefits of surgery  preoperatively including the possibility of failure to resolve the  infection and need for further procedures.  She wished to proceed.   DESCRIPTION OF PROCEDURE:  [name] remained on vancomycin for  empiric control, and a bacterial species has not yet been isolated by  the lab but is in process.  Standard prep and drape was performed of the  right knee after first removing  the dressings.  The knee joint fluid  appeared to be largely serous, and there was mild purulence on the  iodoform gauze through the separate lateral incision.  These were  removed.  A thorough irrigation performed, and all of the 2-0 Vicryl old  suture removed.  Again, no purulence was encountered other than the  small amount that was on the iodoform.  We ran 6 L through the knee  joint with inflow and outflow cannulas and then also performed a  separate irrigation of the anterior compartment and the lateral leg  wound with low-pressure high-volume lavage as well.  A very loose  layered closure was then performed with inverted 2-0 PDS and 3-0 nylon.  A medium Hemovac drain was introduced into the knee and brought out  through a separate incision; and then, a drain was placed directly over  the plate underneath the lateral incision as well.  Two separate drains  were used for those two areas to avoid any cross contamination with  a Y-  connector.  The patient was then placed in a sterile dressing and taken  to the PACU in a stable condition.  [name], PA, assisted throughout  the procedure with gentle flexion and extension of the knee during the  lavage process.  Furthermore, we did switch the cannulas, the inflow  from back and forth between the cannulas to further assist with removal.   PROGNOSIS:  [name] will remain on IV antibiotics, and we would  anticipate converting her to a PICC line in the next 2 days and  discharging her on postop day #2 if her wounds looked to be in excellent  condition.  We will obtain AP and lateral x-rays to make sure there has  been no hardware loosening or migration.      Doralee Albino. Carola Frost, M.D.  Electronically Signed     MHH/MEDQ  D:  03/18/2008  T:  03/19/2008  Job:  [id number]
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
## 2010-09-01 NOTE — Op Note (Signed)
[name], [name]               ACCOUNT NO.:  [id number]   MEDICAL RECORD NO.:  [id number]          PATIENT TYPE:  INP   LOCATION:  5024                         FACILITY:  [facility]   PHYSICIAN:  Rodney A. Mortenson, M.D.DATE OF BIRTH:  17-Aug-1985   DATE OF PROCEDURE:  03/16/2008  DATE OF DISCHARGE:                               OPERATIVE REPORT   JUSTIFICATION:  A 25 year old female had recent open reduction and  internal fixation tibial plateau fracture on the right early November,  discharged from [facility] on February 26, 2008.  For the past 3-4 days, she  has had an increasing pain and swelling about knee.  This was aspirated  last night and cell count of the fluid in the knee was 108,000.  She is  now admitted for I and D of the knee and lateral abscess.  She has had  no real fever, malaise, or break down of the incision.   JUSTIFICATION OUTPATIENT SURGERY:  Minimal morbidity.   PREOPERATIVE DIAGNOSES:  Pyarthrosis, right knee; status post open  reduction and internal fixation, lateral tibial plateau fracture; large  lateral submuscular abscess with communication through the fracture line  into the knee joint; questionable partial tear of anterior cruciate  ligament; normal medial and lateral meniscus.   POSTOPERATIVE DIAGNOSES:  Pyarthrosis, right knee; status post open  reduction and internal fixation, lateral tibial plateau fracture; large  lateral submuscular abscess with communication through the fracture line  into the knee joint; questionable partial tear of anterior cruciate  ligament; normal medial and lateral meniscus.   OPERATIONS:  Arthroscopic incision and drainage and partial synovectomy  of the knee; open incision and drainage of lateral abscess, right knee.   SURGEON:  Lenard Galloway. Chaney Malling, MD   ASSISTANT:  Oris Drone. Petrarca, PA-C   ANESTHESIA:  General.   PROCEDURE:  The patient was placed on the operating table in supine  position with a pneumatic tourniquet  above the right upper thigh.  The  right leg was placed in leg holder.  The entire right lower extremity  was prepped with DuraPrep and draped out in the usual manner.  A  puncture wound was placed in superior medial pouch and a large cannula  was placed in the superior pouch of the knee.  Saline was then infused  into the knee under pressure.  There was a lot of abscess in the area of  the previous open reduction and internal fixation.  A 3-cm incision was  made and a great deal of purulent material came out of this incision.  There was positive pressure in the knee and fluid is draining from the  knee through the fracture site into the abscess site over the lateral  side of the knee.  The palpation inferior the  plate could be felt.  This area was irrigated with large amounts of saline.  Once this was  accomplished, the arthroscope was then placed in the knee.  A very  careful examination of the knee was undertaken.  The articular cartilage  and posterior aspect of the patella and the femoral notch area appeared  normal.  Articular cartilage of both femoral condyles was normal.  Both  the medial and lateral meniscus were normal.  The medial tibial plateau  was normal and the lateral tibial plateau looked excellent.  The  fracture line could be seen, but this was anatomic reduction.  There was  excellent closure.  There was a great deal of edema around the anterior  cruciate ligament, edematous fat was then debrided, and the ACL was  probed carefully.  There may be some looseness of the ACL, but it was  not complete obvious rupture, but there may be an partial tear of the  ACL.  Once this all completed to my satisfaction, it has been a great  deal of time pumping of fluid through the knee, debriding some of the  synovium during the partial synovectomy.  Once this was accomplished,  then the pressure was applied to the lateral wound and dissection was  carried proximal and distally with a  finger.  About 12 liters of fluid  was used to irrigate the knee and the lateral abscess.  Hemovac drain  was placed in the knee and the lateral abscess area was packed open with  iodoform gauze.  Sterile dressings were applied and the patient returned  to recovery room in excellent condition.  Technically, this procedure  went extremely well.  Complications none.      Rodney A. Chaney Malling, M.D.  Electronically Signed     RAM/MEDQ  D:  03/16/2008  T:  03/17/2008  Job:  [id number]

## 2010-09-01 NOTE — Op Note (Signed)
NAMEMARCA, GADSBY               ACCOUNT NO.:  [id number]   MEDICAL RECORD NO.:  [id number]          PATIENT TYPE:  INP   LOCATION:  5036                         FACILITY:  [facility]   PHYSICIAN:  Doralee Albino. Carola Frost, M.D. DATE OF BIRTH:  03-26-1986   DATE OF PROCEDURE:  04/05/2008  DATE OF DISCHARGE:                               OPERATIVE REPORT   PREOPERATIVE DIAGNOSES:  1. Right proximal tibia osteomyelitis.  2. Exposed plate.   POSTOPERATIVE DIAGNOSES:  1. Right proximal tibia osteomyelitis.  2. Exposed plate.   PROCEDURES:  1. Partial excision of bone right proximal tibia.  2. Removal of deep implant.  3. Knee aspiration.  4. Application of wound VAC 10 x 4 cm.   SURGEON:  Doralee Albino. Carola Frost, MD   ASSISTANT:  Mearl Latin, PA-C   ANESTHESIA:  General.   COMPLICATIONS:  None.   TOURNIQUET:  None.   SPECIMENS:  From the knee joint  2 mL for culture.   DISPOSITION:  To PACU.   CONDITION:  Stable.   BRIEF SUMMARY AND INDICATION FOR PROCEDURE:  [name] is a 25-year-  old female status post bilateral proximal tibial plateau fractures.  Unfortunately, she developed a right knee infection treated with serial  debridement and arthroscopic lavage of the knee joint.  She had a PICC  line placed and closure of her drain with eventual drain removal.  Subsequently, she had recurrent wound breakdown with visibility of the  plate.  We discussed the need for hardware removal, the risks and  benefits of surgery including the possibility of failure to eradicate  the infection, need for further surgeries and multiple others.  After  full discussion, she wished to proceed.  She also understood the risk of  loss of fixation of her fracture results in loss of reduction or chronic  malunion.   BRIEF DESCRIPTION OF PROCEDURE:  [name] was taken to the operating  room.  She received preoperative antibiotics as she has been on  antibiotics and we do have a culture for  methicillin-sensitive staph.  We over the old incision excised the draining sinus tract, loosened and  then removed all the screws, used a curette to aggressively excise the  infected bone around the screw holes and where the hardware was.  After  debridement of this bone, we then used copious amounts of low pressure  lavage with a bulb syringe.  We then sterilized the portion of the knee  and aspirated from the knee joint to make sure there was no fluid  collection which might indicate persistent infection of the knee joint.  This was sent to micro but did not seem infectious though did have 2 mL  of serosanguineous type material.  Curettes were used on all the soft  tissue as well as an excision of any friable tissue in the edges  adjacent to the plate.  The wound VAC was then placed into this 10 x 4  cm defect.  The patient was then awakened from anesthesia and  transported to the PACU in stable condition.  [name], PA assisted  throughout the procedure.   PROGNOSIS:  [name] will be on DVT prophylaxis with Lovenox while  she is in the hospital.  We anticipate return to the OR in 3-4 days for  removal of the wound VAC and closure of her drain.  We will follow up  her cultures to make sure she does not requires separate  debridement of  the knee.      Doralee Albino. Carola Frost, M.D.  Electronically Signed     MHH/MEDQ  D:  04/05/2008  T:  04/05/2008  Job:  [id number]
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
## 2010-09-01 NOTE — Discharge Summary (Signed)
NAMEHARMANI, [name]               ACCOUNT NO.:  [id number]   MEDICAL RECORD NO.:  [id number]          PATIENT TYPE:  INP   LOCATION:  5036                         FACILITY:  [facility]   PHYSICIAN:  Doralee Albino. Carola Frost, M.D. DATE OF BIRTH:  [birth year]   DATE OF ADMISSION:  04/05/2008  DATE OF DISCHARGE:  04/10/2008                               DISCHARGE SUMMARY   DISCHARGE DIAGNOSIS:  Right tibia osteomyelitis.   ADDITIONAL DISCHARGE DIAGNOSES:  1. Status post open reduction and internal fixation, right and left      tibial plateau fractures, right talus fracture, February 19, 2008.  2. Anxiety disorder.   PROCEDURES PERFORMED:  1. April 05, 2008, partial excision of bone, right proximal tibia.  2. Removal of deep implant.  3. Knee aspiration.  4. Application of wound vacuum-assisted closure to right knee wound.   1. April 08, 2008, incision and drainage and layer closure of right      leg wound.  2. Debridement of necrotic fascia and muscle, right leg.   BRIEF HISTORY AND HOSPITAL COURSE:  [name] is a very pleasant 25 year old  female who is well known to the Orthopedic Trauma Service.  We performed  open reduction and internal fixation of bilateral tibial plateau  fractures and right talus fracture back in early November.  She had been  progressing well; however, approximately 4 weeks after surgery, she  began to experience some increasing knee pain as such [facility] where knee aspiration was done, which  demonstrated an infection.  She was admitted on March 16, 2008, for  I&D of her knee by Dr. Chaney Malling and then repeat I&D on March 18, 2008, and was started on IV antibiotics via PICC line.  She was  discharged on March 20, 2008, was doing well until about 2 weeks ago  and she began to experience some wound dehiscence with continued  drainage and exposure of the plate.  At this time, the termination would  be necessary to  remove the plate to eliminate potential sources of  bacteria.  [name] was brought into the hospital on April 05, 2008, and  underwent the procedure as described up above.  She tolerated the  procedure well and wound VAC was placed over the exposed wound and she  was admitted to the hospital for continued observation, pain control,  and IV antibiotics.  Over the next several days, her course was  uncomplicated without any complaints or any acute findings.  Her vital  signs and labs were unremarkable during her entire stay.  On April 08, 2008, she was brought back for repeat I&D as well as the closure of  the wound, which she once again tolerated very well.  Postoperative day  #2 from this second procedure, [name] was deemed stable for discharge to  home.   Clinical encounter note for postoperative day #2:  Subjective/objective:  The patient is doing very well, no complaints,  ready to go home.  VITAL SIGNS:  Temperature 97.9, heart rate 89, respirations 18 at 99% on  room air, and  BP is 92/58.  GENERAL:  The patient is awake, alert, comfortable, and in no acute  distress.  LUNGS:  Clear to auscultation.  CARDIAC:  Regular rate and rhythm.  ABDOMEN:  Soft, nontender with positive bowel sounds.  EXTREMITIES:  Right lower extremity dressing and drain were removed.  Incision looks fantastic.  Wound edges are approximated nicely and  healing well.  No drainage.  No erythema is noted.  Knee extension is  lacking approximately 6-8 degrees.  Good active motion at the right  ankle.  No deep calf tenderness.  Compartments are soft and nontender.  Deep peroneal nerve, superficial peroneal nerve, and tibial nerve  sensations are intact.  EHL, FHL, anterior tibialis, posterior tibialis,  peroneals, and gastroc motor complex are intact with motor testing.  There is a palpable dorsalis pedis pulse.  Extremities are warm.  There  is still some edema as well.   LABORATORY DATA:  Sodium 140,  potassium 4.0, chloride 103, bicarb 31,  BUN 3, creatinine 0.43, and glucose 103.  Hemoglobin 9.4, hematocrit  27.5, white blood cells 5.8, and platelets 296.  Cultures:  Preliminary  findings did not demonstrate any growth.   ASSESSMENT AND PLAN:  A 25 year old status post hardware removal after  right proximal tibia osteomyelitis.  1. Non-weightbearing right lower extremity.  She will continue to wear      braces on the right lower extremity.  2. Weightbearing as tolerated, left lower extremity.  3. No further pharmacologic DVT prophylaxis warranted.  4. We will discharge her home today.  5. Continue IV antibiotics x4 more weeks.  6. The patient will follow up in 10-14 days.   DISCHARGE MEDICATIONS:  1. Percocet 10/325 one-two p.o. q.4-6 h. as needed for pain.  2. Oxycodone 5 mg 1-2 p.o. every 3 hours for breakthrough pain as      needed.  3. Robaxin 500 mg 1-2 every 6 hours as needed for spasms.  4. Ancef 1 g IV every 8 hours through PICC x4 more weeks.  5. The patient can continue with aspirin 325 mg p.o. daily.   HOME MEDICATIONS:  The patient may continue with Lexapro 10 mg p.o.  daily.   DISCHARGE INSTRUCTIONS AND PLANS:  [name] hopefully will respond  favorably to procedures that she underwent during this hospital stay.  We are hopeful that removal of the implant from her right proximal tibia  will result in elimination of the infection as well as complete wound  healing.  [name] will continue to be non-weightbearing on her right lower  extremity for the next couple weeks.  We anticipated her to return in 2  weeks' time.  At that time, we do anticipate transitioning to gradual  weightbearing with use of her Bledsoe hinged knee brace.  [name] will  continue with her IV antibiotics and Ancef 1 g IV q.8 h.  Now, we  anticipate to continue this for the next 4 weeks or so.  [name] should  continue with daily dressing changes to the operative wound and may  shower with the  assistance of a shower chair.  Again, we made [name] wear  that should she experience increased pain, increased drainage, erythema.  She should contact the office immediately to address the issues.  [name]  will follow up with our office in the next 2 weeks at which time we will  reevaluate her progress and we anticipate removal of the sutures at  that time and obtain x-rays of her bilateral proximal tibiae  as well as  the right talus to assess for her healing.  [name] should also continue  to do range of motion of her right ankle to prevent development of any  types of contractures.  Should [name] have any questions at any point of  time, she is to feel free to contact the office immediately.      Mearl Latin, PA      Doralee Albino. Carola Frost, M.D.  Electronically Signed    KWP/MEDQ  D:  04/10/2008  T:  04/10/2008  Job:  [id number]
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
## 2010-09-01 NOTE — Op Note (Signed)
[name], [name] NO.:  [id number]   MEDICAL RECORD NO.:  [id number]          PATIENT TYPE:  INP   LOCATION:  2116                         FACILITY:  [facility]   PHYSICIAN:  Adolph Pollack, M.D.DATE OF BIRTH:  January 08, 1986   DATE OF PROCEDURE:  02/19/2008  DATE OF DISCHARGE:                               OPERATIVE REPORT   DIAGNOSIS:  A 1.5 cm left index finger laceration.   PROCEDURE:  Simple closure.   ANESTHESIA:  1% plain lidocaine.   TECHNIQUE:  The wound was irrigated out copiously with hydrogen peroxide  and saline and sterilely prepped and draped.  It was anesthetized with  1% lidocaine.  I then placed 2 stitches to loosely approximate the wound  as it was quite irregular.  These were simple sutures of 4-0 Prolene.  Following this, I placed Neosporin and dry dressing.  She tolerated the  procedure well.      Adolph Pollack, M.D.  Electronically Signed     TJR/MEDQ  D:  02/19/2008  T:  02/19/2008  Job:  [id number]
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
## 2010-09-01 NOTE — Discharge Summary (Signed)
[name], [name]               ACCOUNT NO.:  [id number]   MEDICAL RECORD NO.:  [id number]          PATIENT TYPE:  INP   LOCATION:  5024                         FACILITY:  [facility]   PHYSICIAN:  Doralee Albino. Carola Frost, M.D. DATE OF BIRTH:  1985/12/15   DATE OF ADMISSION:  03/16/2008  DATE OF DISCHARGE:  03/20/2008                               DISCHARGE SUMMARY   DISCHARGE DIAGNOSES:  1. Right septic knee joint.  2. Postoperative wound infection, right lateral leg.   ADDITIONAL DISCHARGE DIAGNOSES:  1. Status post open reduction and internal fixation, right and left      tibial plateau fractures and status post right talus fracture, open      reduction and internal fixation on February 19, 2008.  2. Anxiety disorder.   PROCEDURES PERFORMED:  On March 16, 2008, incision and drainage right  knee and open incision and drainage of lateral incision abscess, right  leg on March 18, 2008.  Repeat incision and drainage of right knee  joint and repeat incision and drainage of right proximal tibia and  anterior compartment incision lateral aspect.   BRIEF HISTORY AND HOSPITAL COURSE:  [name] is a very pleasant 25 year old  female status post bilateral tibial plateau repairs from February 19, 2008, who [facility] past [facility] with  a chief complaint of increasing knee pain.  Subsequent arthrocentesis  demonstrated significant white blood cell count with a total of 18,510  with significant left shift with 94% neutrophils.  The patient was then  admitted directly to [facility] where she underwent her first I  and D on March 16, 2008, by Dr. Chaney Malling.  She tolerated that  procedure well, and then repeat I and D was performed on March 18, 2008, by Dr. Carola Frost.  Again, the patient tolerated this well.  Two  Hemovac drains were placed to aid in further draining.  Prior to  definitive cultures, [name] was placed on vancomycin and rifampin by Dr.  Orvan Falconer, who greatly appreciate the Infectious Disease consult.  Rifampin was added secondary to he believes that the infection did  extend down into the hardware.  [name] tolerated her antibiotics well.  Her hospital course was relatively uncomplicated. She did not experience  any acute complications related to her procedures or antibiotic  administration.  Cultures did ultimately come back from the abscess and  did demonstrate methicillin-sensitive Staphylococcus aureus.  As such,  [name] was then changed to Ancef 1 g IV q.8, and continued with her  rifampin.  On postoperative day #2, [name] was deemed stable both from  physical exam standpoint as well as laboratory evaluation, and to be  discharged home.  Skill nursing was arranged to assist with maintenance  of a PICC line, which was placed postoperative day#1 from her second I  and D procedure.   Clinical encounter note for March 20, 2008, [name] is doing very well.  Pain is better controlled and is ready to go home.  She has no new  issues.  Has had a bowel movement and has voided without difficulty.  PHYSICAL EXAMINATION:  VITAL SIGNS:  Temperature 97.9, heart rate 122,  respirations 20, 100% on room air, and repeat 107/75.  GENERAL:  The patient is awake, alert, and comfortable.  LUNGS:  Clear.  CARDIAC:  Regular, but tachycardic.  ABDOMEN:  Soft and nontender.  Positive bowel sounds.  EXTREMITIES:  Right lower extremity incisions look excellent.  Wound  edges are everted nicely and approximated.  Minimal edema of the right  foot.  Deep peroneal nerve, superficial peroneal nerve, and tibial nerve  sensation is intact.  EHL, FHL, anterior tibialis, posterior tibialis,  peroneals, and gastroc motor function is intact.  Palpable dorsalis  pedis pulse.  Extremities, good color.  Drains have been discontinued.  No deep calf tenderness is noted.  Compartments are soft and nontender.   PERTINENT LABORATORY VALUES:  BMET; sodium 139,  potassium 3.7, chloride  100, bicarb 29, BUN 2, creatinine 0.35, and glucose 108.  CBC; white  blood cell 6.5, hemoglobin 9.5, hematocrit 27.0, and platelets 352.   ASSESSMENT AND PLAN:  A 25 year old female status post incision and  drainage right knee secondary to septic arthritis.   1. Nonweightbearing bilateral lower extremities secondary to previous      injuries.  Range of motion with Bledsoe hinged knee brace is okay      for bilateral knees.  2. Harmony should aware of her boots on her bilateral lower extremities      as well to maintain her ankles in the neutral position.  3. [name] should receive her Lovenox before discharge today.  4. Follow up in 10 days.  5. IV Ancef and p.o. rifampin x6 to 8 weeks.  6. Dressings have been changed and the drains have been discontinued.  7. Appreciate Infectious Disease consult and input.   DISCHARGE MEDICATIONS:  1. Percocet 5/325 one to two p.o. q.4-6 h. as needed for pain, #60.  2. Oxycodone 5 mg 1-2 every 3 hours for breakthrough pain, #30.  3. Robaxin 500 mg 1-2 every 6 hours as needed for spasms, #60.  4. Rifampin 300 mg 2 p.o. daily x8 weeks.  5. Ancef 1 g IV every 8 hours via PICC line x8 weeks.  6. Aspirin 325 mg daily, this can be over-the-counter and doing this      for anticoagulation.  I do expect Ruba to be fairly active, but      again we would like to anticoagulate her, but I do not feel that      she needs any further Lovenox as she was adequately anticoagulated      upon initial hospital stay, and has received Lovenox during this      visit as well.  [name] may resume her home medications as well as      Lexapro.   DISCHARGE INSTRUCTIONS:  [name] will be on IV antibiotics for the next 8  weeks or so, which will include Ancef 1 g IV q.8 as well as p.o.  rifampin 600 mg p.o. daily.  Again, this therapy will last 6-8 weeks.  She should perform daily dressing changes, and has been provided a wound  care sheet to aid with  this.  There has been no change in [name]'s  weightbearing status as she is still only approximately 4 weeks from her  initial injury.  Therefore, she is nonweightbearing in her bilateral  lower extremities.  Amir has bilateral Bledsoe hinged knee braces, and  she is going to perform gentle range of motion of her bilateral knees  to  help prevent knee contracture.  Her incisions look excellent  postoperatively and upon her dressing changes, and we anticipate that  she will do very well.  Maryanne will follow up with the office in 10 days  for reevaluation.  At that time, we will check her wounds and likely  remove the remaining sutures.  We will obtain x-rays at that visit to  evaluate hardware placement and fracture healing.  Mardee should not  require any additional formal DVT prophylaxis as she was fully  anticoagulated with Lovenox during her initial hospital stay for 14  days, and was on Lovenox during this hospital stay too.  As such, I will  add aspirin 325 mg p.o. daily to help with anticoagulation from possible  thrombosis associated with PICC line as well.  Joanna should be fairly  active within the confines of her weightbearing restrictions, but should  also by no means be  on strict bed rest.  She was encouraged to move her legs, including her  knees, hips, and ankles to help with range of motion, and promote blood  flow and prevent venostasis.  Again, Maryln will follow up in the office  in 10 days.  Should she have any questions at any point in time, she is  to feel free to contact the office.      Mearl Latin, PA      Doralee Albino. Carola Frost, M.D.  Electronically Signed    KWP/MEDQ  D:  03/20/2008  T:  03/20/2008  Job:  [id number]

## 2010-09-04 NOTE — H&P (Signed)
NAMENANCI, LAKATOS NO.:  [id number]   MEDICAL RECORD NO.:  [id number]           PATIENT TYPE:   LOCATION:                                 FACILITY:   PHYSICIAN:  Tilda Burrow, M.D. DATE OF BIRTH:  11-09-1985   DATE OF ADMISSION:  DATE OF DISCHARGE:  LH                              HISTORY & PHYSICAL   ADMISSION DIAGNOSES:  Pregnancy, [redacted] weeks gestation, missed abortion.   HISTORY OF PRESENT ILLNESS:  This 25 year old female, gravida 1, para 0  with a history of irregular menses and LMP in March with ultrasound-  assigned EDC placing her at 15 weeks, 5 days when she presented for  routine prenatal visit.  She had been seen just a few days earlier for a  colposcopy which showed a grossly normal cervix, and fetal heart tones  were documented at the time.  Five days later, she has absent fetal  heart motion.  There were no biopsies or anything done and no chemicals  done at the time of the colposcopy.  Membranes are intact.  Baby is  grossly normal on ultrasound except there is absent fetal heart motion.  The placenta is anterior.  Normal fluid volume.   The patient is counseled over the options of continued observation of  proceeding to [facility], proceeding with attempting to use prostaglandins to  induce labor and delivery.  Patient does not desire to wait until  spontaneous miscarriage.  We will therefore admit her on March 26, 2006 for serial prostaglandin suppositories with plans for probable  vaginal expulsion of the fetus and placenta.  Blood type is A-.  Rubella  immunity present.  Hemoglobin 13, hematocrit 41.  Hepatitis, HIV, RPR,  GC, and Chlamydia all negative.  Pap smear showed __________ on August  27 with GC and Chlamydia negative.   PLAN:  Repeat colposcopy post partum.  This patient is supported by  family members.   PHYSICAL EXAMINATION:  VITAL SIGNS:  Height 5 feet 6.  Weight 120.  Blood pressure 110/72.  GENERAL:  A healthy Caucasian  female tolerating the news of loss quite  well with a very stoic attitude.  ABDOMEN:  Nontender.  PELVIC:  Cervix is closed, nonpurulent.  Uterus is anteflexed with 15  week size fetus on ultrasound.   PLAN:  Prostaglandin suppository on March 26, 2006.      Tilda Burrow, M.D.  Electronically Signed     JVF/MEDQ  D:  03/25/2006  T:  03/25/2006  Job:  [id number]

## 2010-09-04 NOTE — Op Note (Signed)
NAMEPHAEDRA, [name]               ACCOUNT NO.:  [id number]   MEDICAL RECORD NO.:  [id number]          PATIENT TYPE:  INP   LOCATION:  LDR5                          FACILITY:  [facility]   PHYSICIAN:  Tilda Burrow, M.D. DATE OF BIRTH:  [birth year]   DATE OF PROCEDURE:  03/26/2006  DATE OF DISCHARGE:  03/27/2006                               OPERATIVE REPORT   LABOR SUMMARY AND DELIVERY NOTE   [name] was admitted early on 03/26/2006 with prostaglandin 20 mg  suppositories used q.6 h. x2.  She began to experience discomfort in the  midafternoon.  She had a heavy gush of blood, at least a unit of blood  expelled at approximately 6 p.m.  Bleeding thereafter that was heavier  than usual.  Exam showed the cervix to remain quite posterior beneath a  dilated lower uterine segment.  Initially the cervix was only 1 cm and  we attempted to place a Foley bulb to try to help with the dilation.  This was unsuccessful due to patient discomfort.  After continued  greater than usual bleeding and cramping, she then quit cramping, likely  due to expulsion of most of the prostaglandins.  IV oxytocin was  initiated.  Reattempt at Foley bulb at approximately 8 p.m. was  attempted without success, even with speculum exam.  She then progressed  until 10 p.m. with spontaneous expulsion of the fetus.  It was a female  with obvious evidence of still birth.  There were no gross abnormalities  to the fetus.   The cord appeared to have a 3-vessel cord.  She expelled the placenta at  approximately 1020 p.m.  Throughout labor she was supported by family  members.  Because of the rather significant repetitive heavy blood loss,  estimated at 2-3 units of blood we transfused 1 unit during the latter  portions of her labor.  We will stop with 1 unit until the morning as  she is currently hemodynamically stable and the delivery is completed.   Questions were answered for patient and family.  They showed appropriate  interest in each other and the fetus.  Plans currently are for disposal  of the fetus by the hospital.      Tilda Burrow, M.D.  Electronically Signed     JVF/MEDQ  D:  03/26/2006  T:  03/27/2006  Job:  [id number]   cc:   [facility]

## 2010-09-04 NOTE — Discharge Summary (Signed)
[name], [name]               ACCOUNT NO.:  [id number]   MEDICAL RECORD NO.:  [id number]          PATIENT TYPE:  INP   LOCATION:  LDR5                          FACILITY:  [facility]   PHYSICIAN:  Tilda Burrow, M.D. DATE OF BIRTH:  July 05, 1985   DATE OF ADMISSION:  03/26/2006  DATE OF DISCHARGE:  12/09/2007LH                               DISCHARGE SUMMARY   ADMISSION DIAGNOSES:  Pregnancy at [redacted] weeks gestation, missed abortion.   DISCHARGE DIAGNOSES:  Pregnancy at 16 weeks, delivered, missed abortion.   PROCEDURE:  Prostaglandin E-2 suppository induction of labor.   HOSPITAL COURSE:  This 25 year old primiparous female with ultrasound  EDC placing her at 15 weeks, 5 days, was found to have absent heartbeat  in the office as described in the HPI. She was admitted with blood type  A negative and hemoglobin and hematocrit 13 and 41 from her prenatal  course. She was admitted for Prostaglandin suppositories to evacuate the  uterus. Admission hemoglobin was 12.6, hematocrit 36.6. Blood type  confirmed as A negative. The patient received 2 Prostaglandin  suppositories and developed a strong labor. She began to bleed heavily  at approximately 6:00 p.m. and bled at least 800 to 1,000 cc over the  next 2 to 3 hours. It appeared that the low anterior mid position had  separated off early in the miscarriage process. She could not tolerate  placement of a Foley bulb to help outstretch the cervix but eventually  progressed until the 10:00 p.m. spontaneous expulsion of the 150 gram,  apparently female fetus that was grossly normal. The placenta was sent  with the fetus for histology confirmation. The questions form the family  were answered. She had lots of support and people around her. She  received 1 unit of packed cells during the 8:00 p.m. to 10:00 p.m. time  frame due to the severity of the bleeding. The following morning,  hemoglobin was 29% after the 1 unit of packed cells. She was  emotionally  quite stable, considering her options with regards to future  childbearing. She will be seen back in 2 weeks with instructions to  continue prenatal vitamins, watch for temperature, or increased  bleeding.   FOLLOWUP:  In 2 weeks.      Tilda Burrow, M.D.  Electronically Signed     JVF/MEDQ  D:  03/27/2006  T:  03/27/2006  Job:  [id number]   cc:   [facility]

## 2010-09-15 ENCOUNTER — Ambulatory Visit: Admitting: Gastroenterology

## 2010-10-06 ENCOUNTER — Other Ambulatory Visit: Payer: Self-pay | Admitting: Gastroenterology

## 2010-10-06 NOTE — Telephone Encounter (Signed)
No showed for 5/29 appt. Does she have appt w/ [facility] this month?   If so, when? She will need OV prior to pain meds.

## 2010-10-06 NOTE — Telephone Encounter (Signed)
Pt would like to have a refill on her Percocet- She is having a reoccurrence of her abdomina pain.

## 2010-10-06 NOTE — Telephone Encounter (Signed)
Spoke w/ Pt, she stated she is scheduled for an appt @ [facility] on 10/12/10 and was unaware she missed an appt with [place]

## 2010-10-06 NOTE — Telephone Encounter (Signed)
Pt seems confused about [facility] OV, giving different dates per our records. CG called [facility] to verify OV-no records of pt appt Pt needs OV next available or to ER Hx positive marijuana drug screen

## 2010-10-07 NOTE — Telephone Encounter (Signed)
Pt is scheduled to see [facility] on 10/12/10.

## 2010-10-12 ENCOUNTER — Ambulatory Visit: Admitting: Urgent Care

## 2010-11-05 ENCOUNTER — Emergency Department (HOSPITAL_COMMUNITY)

## 2010-11-05 ENCOUNTER — Encounter (HOSPITAL_COMMUNITY): Payer: Self-pay | Admitting: Emergency Medicine

## 2010-11-05 ENCOUNTER — Emergency Department (HOSPITAL_COMMUNITY)
Admission: EM | Admit: 2010-11-05 | Discharge: 2010-11-05 | Disposition: A | Discharge status: Home / Self Care | Attending: Emergency Medicine | Admitting: Emergency Medicine

## 2010-11-05 DIAGNOSIS — T07XXXA Unspecified multiple injuries, initial encounter: Secondary | ICD-10-CM | POA: Insufficient documentation

## 2010-11-05 DIAGNOSIS — F17200 Nicotine dependence, unspecified, uncomplicated: Secondary | ICD-10-CM | POA: Insufficient documentation

## 2010-11-05 DIAGNOSIS — T148XXA Other injury of unspecified body region, initial encounter: Secondary | ICD-10-CM

## 2010-11-05 DIAGNOSIS — F1210 Cannabis abuse, uncomplicated: Secondary | ICD-10-CM | POA: Insufficient documentation

## 2010-11-05 DIAGNOSIS — IMO0002 Reserved for concepts with insufficient information to code with codable children: Secondary | ICD-10-CM | POA: Insufficient documentation

## 2010-11-05 MED ORDER — HYDROCODONE-ACETAMINOPHEN 5-325 MG PO TABS: 1.0000 | ORAL_TABLET | ORAL | Status: AC | PRN

## 2010-11-05 MED ORDER — METHOCARBAMOL 500 MG PO TABS: ORAL_TABLET | ORAL | Status: DC

## 2010-11-05 NOTE — ED Notes (Signed)
Patient with no complaints at this time. Respirations even and unlabored. Skin warm/dry. Discharge instructions reviewed with patient at this time. Patient given opportunity to voice concerns/ask questions. Patient discharged at this time and left Emergency Department with steady gait.   
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
## 2010-11-18 NOTE — ED Provider Notes (Signed)
Medical screening examination/treatment/procedure(s) were performed by non-physician practitioner and as supervising physician I was immediately available for consultation/collaboration.   Nelia Shi, MD 11/18/10 2100

## 2011-01-18 LAB — STREP A DNA PROBE: Group A Strep Probe: NEGATIVE

## 2011-01-18 LAB — RAPID STREP SCREEN (MED CTR MEBANE ONLY): Streptococcus, Group A Screen (Direct): NEGATIVE

## 2011-01-19 LAB — BASIC METABOLIC PANEL
BUN: 2 — ABNORMAL LOW
BUN: 3 — ABNORMAL LOW
BUN: 5 — ABNORMAL LOW
CO2: 29 mEq/L (ref 19–32)
CO2: 29 mEq/L (ref 19–32)
CO2: 25
Calcium: 8.3 — ABNORMAL LOW
Calcium: 8.5
Chloride: 102 mEq/L (ref 96–112)
Chloride: 103 mEq/L (ref 96–112)
Chloride: 102
Chloride: 103
Chloride: 104
Creatinine, Ser: 0.41 mg/dL (ref 0.4–1.2)
Creatinine, Ser: 0.49
Creatinine, Ser: 0.58
GFR calc Af Amer: >60 mL/min (ref >60)
GFR calc Af Amer: >60 mL/min (ref >60)
GFR calc Af Amer: >60
GFR calc Af Amer: >60
GFR calc Af Amer: >60
GFR calc non Af Amer: >60
GFR calc non Af Amer: >60
Glucose, Bld: 122 mg/dL — ABNORMAL HIGH (ref 70–99)
Glucose, Bld: 112 — ABNORMAL HIGH
Glucose, Bld: 115 — ABNORMAL HIGH
Potassium: 4
Potassium: 4.1
Potassium: 4.1
Potassium: 4.2
Sodium: 138 mEq/L (ref 135–145)
Sodium: 138 mEq/L (ref 135–145)
Sodium: 138

## 2011-01-19 LAB — CBC
HCT: 26.7 — ABNORMAL LOW
HCT: 28.2 — ABNORMAL LOW
HCT: 28.6 — ABNORMAL LOW
HCT: 30.9 — ABNORMAL LOW
HCT: 31.1 — ABNORMAL LOW
HCT: 32.4 — ABNORMAL LOW
HCT: 34 — ABNORMAL LOW
HCT: 38.1
HCT: 39.1
HCT: 40.6
HCT: 41
Hemoglobin: 9.6 g/dL — ABNORMAL LOW (ref 12.0–15.0)
Hemoglobin: 9.9 g/dL — ABNORMAL LOW (ref 12.0–15.0)
Hemoglobin: 10.4 — ABNORMAL LOW
Hemoglobin: 10.4 — ABNORMAL LOW
Hemoglobin: 13.8
Hemoglobin: 13.9
Hemoglobin: 9.7 — ABNORMAL LOW
Hemoglobin: 9.8 — ABNORMAL LOW
MCHC: 34.4 g/dL (ref 30.0–36.0)
MCHC: 34.9 g/dL (ref 30.0–36.0)
MCHC: 33.3
MCHC: 33.6
MCHC: 34
MCHC: 34.1
MCHC: 34.7
MCHC: 34.7
MCV: 89.8 fL (ref 78.0–100.0)
MCV: 90.4 fL (ref 78.0–100.0)
MCV: 89.3
MCV: 90
MCV: 90
MCV: 90
MCV: 90.2
MCV: 90.3
MCV: 90.7
MCV: 91.1
MCV: 91.3
MCV: 91.4
Platelets: 180
Platelets: 203
Platelets: 219
Platelets: 229
Platelets: 281
Platelets: 299
Platelets: 373
Platelets: 385
Platelets: 394
Platelets: 400
RBC: 3.08 MIL/uL — ABNORMAL LOW (ref 3.87–5.11)
RBC: 3.15 MIL/uL — ABNORMAL LOW (ref 3.87–5.11)
RBC: 3.16 — ABNORMAL LOW
RBC: 3.3 — ABNORMAL LOW
RBC: 3.43 — ABNORMAL LOW
RBC: 3.45 — ABNORMAL LOW
RBC: 3.77 — ABNORMAL LOW
RBC: 4.32
RDW: 14.4 % (ref 11.5–15.5)
RDW: 12.9
RDW: 13
RDW: 13.1
RDW: 13.2
RDW: 13.3
RDW: 13.5
RDW: 13.5
RDW: 13.7
RDW: 13.9
WBC: 8.8 10*3/uL (ref 4.0–10.5)
WBC: 10.2
WBC: 11.1 — ABNORMAL HIGH
WBC: 11.4 — ABNORMAL HIGH
WBC: 12.2 — ABNORMAL HIGH
WBC: 12.9 — ABNORMAL HIGH
WBC: 13.3 — ABNORMAL HIGH
WBC: 34.8 — ABNORMAL HIGH
WBC: 9
WBC: 9.6

## 2011-01-19 LAB — DIFFERENTIAL
Basophils Absolute: 0
Basophils Relative: 0
Basophils Relative: 0
Eosinophils Absolute: 0
Eosinophils Absolute: 0
Eosinophils Absolute: 0.1
Eosinophils Relative: 0
Eosinophils Relative: 1
Lymphocytes Relative: 5 — ABNORMAL LOW
Lymphs Abs: 0.7
Lymphs Abs: 1.1
Monocytes Absolute: 0.2
Monocytes Absolute: 1.2 — ABNORMAL HIGH
Monocytes Relative: 10
Monocytes Relative: 6
Neutro Abs: 31.7 — ABNORMAL HIGH
Neutro Abs: 9.3 — ABNORMAL HIGH
Neutrophils Relative %: 91 — ABNORMAL HIGH

## 2011-01-19 LAB — PREPARE RBC (CROSSMATCH)

## 2011-01-19 LAB — POCT I-STAT, CHEM 8
Chloride: 106
Glucose, Bld: 172 — ABNORMAL HIGH
HCT: 34 — ABNORMAL LOW
Hemoglobin: 11.6 — ABNORMAL LOW
Potassium: 3.1 — ABNORMAL LOW
Sodium: 141

## 2011-01-19 LAB — TYPE AND SCREEN
ABO/RH(D): A NEG
Antibody Screen: NEGATIVE

## 2011-01-19 LAB — URINALYSIS, ROUTINE W REFLEX MICROSCOPIC
Bilirubin Urine: NEGATIVE
Glucose, UA: NEGATIVE
Ketones, ur: NEGATIVE
pH: 7.5

## 2011-01-19 LAB — POCT I-STAT 4, (NA,K, GLUC, HGB,HCT)
Glucose, Bld: 112 — ABNORMAL HIGH
HCT: 15 — ABNORMAL LOW
Hemoglobin: 5.1 — CL
Potassium: 4.4
Potassium: 4.5
Sodium: 134 — ABNORMAL LOW

## 2011-01-19 LAB — URINE CULTURE
Colony Count: >=100000
Special Requests: NEGATIVE

## 2011-01-19 LAB — URINE MICROSCOPIC-ADD ON

## 2011-01-19 LAB — POCT CARDIAC MARKERS
CKMB, poc: 4.6
Myoglobin, poc: 488
Troponin i, poc: <0.05

## 2011-01-19 LAB — GLUCOSE, CAPILLARY
Glucose-Capillary: 110 — ABNORMAL HIGH
Glucose-Capillary: 113 — ABNORMAL HIGH

## 2011-01-19 LAB — HCG, SERUM, QUALITATIVE: Preg, Serum: NEGATIVE

## 2011-01-22 LAB — CBC
HCT: 27 % — ABNORMAL LOW (ref 36.0–46.0)
HCT: 39.1 % (ref 36.0–46.0)
Hemoglobin: 10.9 g/dL — ABNORMAL LOW (ref 12.0–15.0)
Hemoglobin: 13.1 g/dL (ref 12.0–15.0)
Hemoglobin: 9.1 g/dL — ABNORMAL LOW (ref 12.0–15.0)
Hemoglobin: 9.1 g/dL — ABNORMAL LOW (ref 12.0–15.0)
Hemoglobin: 9.4 g/dL — ABNORMAL LOW (ref 12.0–15.0)
Hemoglobin: 9.5 g/dL — ABNORMAL LOW (ref 12.0–15.0)
Hemoglobin: 9.5 g/dL — ABNORMAL LOW (ref 12.0–15.0)
MCHC: 33.2 g/dL (ref 30.0–36.0)
MCHC: 33.4 g/dL (ref 30.0–36.0)
MCHC: 33.5 g/dL (ref 30.0–36.0)
MCHC: 34.2 g/dL (ref 30.0–36.0)
MCV: 92.5 fL (ref 78.0–100.0)
MCV: 92.8 fL (ref 78.0–100.0)
Platelets: 256 10*3/uL (ref 150–400)
Platelets: 311 10*3/uL (ref 150–400)
Platelets: 337 10*3/uL (ref 150–400)
RBC: 3 MIL/uL — ABNORMAL LOW (ref 3.87–5.11)
RBC: 3.05 MIL/uL — ABNORMAL LOW (ref 3.87–5.11)
RBC: 3.11 MIL/uL — ABNORMAL LOW (ref 3.87–5.11)
RBC: 3.52 MIL/uL — ABNORMAL LOW (ref 3.87–5.11)
RBC: 4.26 MIL/uL (ref 3.87–5.11)
RDW: 15.5 % (ref 11.5–15.5)
RDW: 15.7 % — ABNORMAL HIGH (ref 11.5–15.5)
RDW: 15.8 % — ABNORMAL HIGH (ref 11.5–15.5)
RDW: 15.8 % — ABNORMAL HIGH (ref 11.5–15.5)
WBC: 4.1 10*3/uL (ref 4.0–10.5)
WBC: 6.1 10*3/uL (ref 4.0–10.5)
WBC: 6.5 10*3/uL (ref 4.0–10.5)

## 2011-01-22 LAB — BASIC METABOLIC PANEL
BUN: 1 mg/dL — ABNORMAL LOW (ref 6–23)
BUN: 4 mg/dL — ABNORMAL LOW (ref 6–23)
BUN: <1 mg/dL — ABNORMAL LOW (ref 6–23)
CO2: 29 mEq/L (ref 19–32)
CO2: 31 mEq/L (ref 19–32)
CO2: 32 mEq/L (ref 19–32)
Calcium: 8.7 mg/dL (ref 8.4–10.5)
Calcium: 8.9 mg/dL (ref 8.4–10.5)
Calcium: 9 mg/dL (ref 8.4–10.5)
Calcium: 9.1 mg/dL (ref 8.4–10.5)
Calcium: 9.6 mg/dL (ref 8.4–10.5)
Chloride: 105 mEq/L (ref 96–112)
Creatinine, Ser: 0.38 mg/dL — ABNORMAL LOW (ref 0.4–1.2)
GFR calc Af Amer: >60 mL/min (ref >60)
GFR calc Af Amer: >60 mL/min (ref >60)
GFR calc Af Amer: >60 mL/min (ref >60)
GFR calc non Af Amer: >60 mL/min (ref >60)
GFR calc non Af Amer: >60 mL/min (ref >60)
GFR calc non Af Amer: >60 mL/min (ref >60)
Glucose, Bld: 105 mg/dL — ABNORMAL HIGH (ref 70–99)
Glucose, Bld: 116 mg/dL — ABNORMAL HIGH (ref 70–99)
Glucose, Bld: 117 mg/dL — ABNORMAL HIGH (ref 70–99)
Glucose, Bld: 92 mg/dL (ref 70–99)
Potassium: 3.5 mEq/L (ref 3.5–5.1)
Potassium: 3.7 mEq/L (ref 3.5–5.1)
Potassium: 3.9 mEq/L (ref 3.5–5.1)
Sodium: 133 mEq/L — ABNORMAL LOW (ref 135–145)
Sodium: 137 mEq/L (ref 135–145)
Sodium: 138 mEq/L (ref 135–145)
Sodium: 139 mEq/L (ref 135–145)
Sodium: 140 mEq/L (ref 135–145)

## 2011-01-22 LAB — COMPREHENSIVE METABOLIC PANEL
ALT: 12 U/L (ref 0–35)
AST: 14 U/L (ref 0–37)
Albumin: 3.1 g/dL — ABNORMAL LOW (ref 3.5–5.2)
Alkaline Phosphatase: 86 U/L (ref 39–117)
BUN: 1 mg/dL — ABNORMAL LOW (ref 6–23)
BUN: 5 mg/dL — ABNORMAL LOW (ref 6–23)
CO2: 25 mEq/L (ref 19–32)
Calcium: 8.9 mg/dL (ref 8.4–10.5)
Calcium: 9.3 mg/dL (ref 8.4–10.5)
Creatinine, Ser: 0.33 mg/dL — ABNORMAL LOW (ref 0.4–1.2)
Creatinine, Ser: 0.43 mg/dL (ref 0.4–1.2)
GFR calc Af Amer: >60 mL/min (ref >60)
GFR calc non Af Amer: >60 mL/min (ref >60)
Glucose, Bld: 107 mg/dL — ABNORMAL HIGH (ref 70–99)
Glucose, Bld: 126 mg/dL — ABNORMAL HIGH (ref 70–99)
Potassium: 4.1 mEq/L (ref 3.5–5.1)
Total Bilirubin: 0.4 mg/dL (ref 0.3–1.2)
Total Protein: 5.8 g/dL — ABNORMAL LOW (ref 6.0–8.3)
Total Protein: 6.4 g/dL (ref 6.0–8.3)

## 2011-01-22 LAB — DIFFERENTIAL
Basophils Absolute: 0 10*3/uL (ref 0.0–0.1)
Basophils Relative: 1 % (ref 0–1)
Eosinophils Absolute: 0 10*3/uL (ref 0.0–0.7)
Eosinophils Relative: 3 % (ref 0–5)
Eosinophils Relative: 3 % (ref 0–5)
Lymphocytes Relative: 13 % (ref 12–46)
Lymphocytes Relative: 21 % (ref 12–46)
Lymphocytes Relative: 21 % (ref 12–46)
Lymphs Abs: 1 10*3/uL (ref 0.7–4.0)
Lymphs Abs: 1.3 10*3/uL (ref 0.7–4.0)
Monocytes Absolute: 0.5 10*3/uL (ref 0.1–1.0)
Monocytes Absolute: 0.6 10*3/uL (ref 0.1–1.0)
Monocytes Relative: 8 % (ref 3–12)
Neutro Abs: 4 10*3/uL (ref 1.7–7.7)
Neutro Abs: 4.7 10*3/uL (ref 1.7–7.7)
Neutrophils Relative %: 81 % — ABNORMAL HIGH (ref 43–77)

## 2011-01-22 LAB — URINE CULTURE
Colony Count: NO GROWTH
Culture: NO GROWTH

## 2011-01-22 LAB — GRAM STAIN

## 2011-01-22 LAB — PROTIME-INR
INR: 1.3 (ref 0.00–1.49)
Prothrombin Time: 16.1 seconds — ABNORMAL HIGH (ref 11.6–15.2)

## 2011-01-22 LAB — ANAEROBIC CULTURE

## 2011-01-22 LAB — PHOSPHORUS: Phosphorus: 3.5 mg/dL (ref 2.3–4.6)

## 2011-01-22 LAB — BODY FLUID CULTURE

## 2011-01-22 LAB — URINALYSIS, ROUTINE W REFLEX MICROSCOPIC
Glucose, UA: NEGATIVE mg/dL
Hgb urine dipstick: NEGATIVE
Hgb urine dipstick: NEGATIVE
Ketones, ur: NEGATIVE mg/dL
Protein, ur: NEGATIVE mg/dL
Urobilinogen, UA: 0.2 mg/dL (ref 0.0–1.0)
pH: 6.5 (ref 5.0–8.0)

## 2011-01-22 LAB — D-DIMER, QUANTITATIVE: D-Dimer, Quant: 0.95 ug/mL-FEU — ABNORMAL HIGH (ref 0.00–0.48)

## 2011-01-22 LAB — SYNOVIAL CELL COUNT + DIFF, W/ CRYSTALS
Crystals, Fluid: NONE SEEN
Lymphocytes-Synovial Fld: 3 % (ref 0–20)
Monocyte-Macrophage-Synovial Fluid: 2 % — ABNORMAL LOW (ref 50–90)
Neutrophil, Synovial: 94 % — ABNORMAL HIGH (ref 0–25)
WBC, Synovial: 9650 /mm3 — ABNORMAL HIGH (ref 0–200)

## 2011-01-22 LAB — POCT CARDIAC MARKERS
CKMB, poc: <1 ng/mL — ABNORMAL LOW (ref 1.0–8.0)
Myoglobin, poc: 32.6 ng/mL (ref 12–200)

## 2011-01-22 LAB — LIPASE, BLOOD
Lipase: 1285 U/L — ABNORMAL HIGH (ref 11–59)
Lipase: 354 U/L — ABNORMAL HIGH (ref 11–59)
Lipase: 506 U/L — ABNORMAL HIGH (ref 11–59)

## 2011-01-22 LAB — AMYLASE: Amylase: 390 U/L — ABNORMAL HIGH (ref 27–131)

## 2011-01-22 LAB — RAPID URINE DRUG SCREEN, HOSP PERFORMED
Amphetamines: NOT DETECTED
Barbiturates: NOT DETECTED
Tetrahydrocannabinol: NOT DETECTED

## 2011-04-12 ENCOUNTER — Encounter (HOSPITAL_COMMUNITY): Payer: Self-pay

## 2011-04-12 ENCOUNTER — Inpatient Hospital Stay (HOSPITAL_COMMUNITY)
Admission: EM | Admit: 2011-04-12 | Discharge: 2011-04-15 | DRG: 440 | Disposition: A | Discharge status: Home / Self Care | Payer: Self-pay | Attending: Family Medicine | Admitting: Family Medicine

## 2011-04-12 DIAGNOSIS — R110 Nausea: Secondary | ICD-10-CM | POA: Diagnosis present

## 2011-04-12 DIAGNOSIS — Z23 Encounter for immunization: Secondary | ICD-10-CM

## 2011-04-12 DIAGNOSIS — K859 Acute pancreatitis without necrosis or infection, unspecified: Principal | ICD-10-CM | POA: Diagnosis present

## 2011-04-12 DIAGNOSIS — F1193 Opioid use, unspecified with withdrawal: Secondary | ICD-10-CM | POA: Diagnosis present

## 2011-04-12 DIAGNOSIS — F1123 Opioid dependence with withdrawal: Secondary | ICD-10-CM | POA: Diagnosis present

## 2011-04-12 DIAGNOSIS — F17200 Nicotine dependence, unspecified, uncomplicated: Secondary | ICD-10-CM | POA: Diagnosis present

## 2011-04-12 DIAGNOSIS — Q453 Other congenital malformations of pancreas and pancreatic duct: Secondary | ICD-10-CM

## 2011-04-12 DIAGNOSIS — K8689 Other specified diseases of pancreas: Secondary | ICD-10-CM | POA: Diagnosis present

## 2011-04-12 DIAGNOSIS — IMO0002 Reserved for concepts with insufficient information to code with codable children: Secondary | ICD-10-CM | POA: Diagnosis present

## 2011-04-12 DIAGNOSIS — Z720 Tobacco use: Secondary | ICD-10-CM | POA: Diagnosis present

## 2011-04-12 DIAGNOSIS — T40605A Adverse effect of unspecified narcotics, initial encounter: Secondary | ICD-10-CM | POA: Diagnosis present

## 2011-04-12 LAB — CBC
MCH: 30.9 pg (ref 26.0–34.0)
MCV: 90.3 fL (ref 78.0–100.0)
Platelets: 324 10*3/uL (ref 150–400)
RBC: 4.24 MIL/uL (ref 3.87–5.11)
RDW: 14.9 % (ref 11.5–15.5)
WBC: 8.3 10*3/uL (ref 4.0–10.5)

## 2011-04-12 LAB — DIFFERENTIAL
Basophils Absolute: 0 10*3/uL (ref 0.0–0.1)
Eosinophils Absolute: 0.2 10*3/uL (ref 0.0–0.7)
Eosinophils Relative: 2 % (ref 0–5)
Lymphs Abs: 3.1 10*3/uL (ref 0.7–4.0)
Neutrophils Relative %: 49 % (ref 43–77)

## 2011-04-12 LAB — URINALYSIS, ROUTINE W REFLEX MICROSCOPIC
Bilirubin Urine: NEGATIVE
Hgb urine dipstick: NEGATIVE
Nitrite: NEGATIVE
Protein, ur: NEGATIVE mg/dL
Specific Gravity, Urine: >1.03 — ABNORMAL HIGH (ref 1.005–1.030)
Urobilinogen, UA: 0.2 mg/dL (ref 0.0–1.0)

## 2011-04-12 LAB — COMPREHENSIVE METABOLIC PANEL
ALT: 9 U/L (ref 0–35)
AST: 11 U/L (ref 0–37)
Albumin: 4.2 g/dL (ref 3.5–5.2)
Alkaline Phosphatase: 75 U/L (ref 39–117)
Calcium: 9.9 mg/dL (ref 8.4–10.5)
Potassium: 3.5 mEq/L (ref 3.5–5.1)
Sodium: 136 mEq/L (ref 135–145)
Total Protein: 7.5 g/dL (ref 6.0–8.3)

## 2011-04-12 MED ORDER — HYDROMORPHONE HCL PF 1 MG/ML IJ SOLN
1.0000 mg | Freq: Once | INTRAMUSCULAR | Status: AC
  Administered 2011-04-12: 1 mg via INTRAVENOUS
  Filled 2011-04-12: qty 1

## 2011-04-12 MED ORDER — SODIUM CHLORIDE 0.9 % IV SOLN: Freq: Once | INTRAVENOUS | Status: DC

## 2011-04-12 MED ORDER — PROMETHAZINE HCL 25 MG/ML IJ SOLN
12.5000 mg | Freq: Once | INTRAMUSCULAR | Status: AC
  Administered 2011-04-12: 12.5 mg via INTRAVENOUS
  Filled 2011-04-12: qty 1

## 2011-04-12 MED ORDER — SODIUM CHLORIDE 0.9 % IV SOLN
Freq: Once | INTRAVENOUS | Status: AC
  Administered 2011-04-13: 01:00:00 via INTRAVENOUS
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
## 2011-04-12 NOTE — ED Notes (Addendum)
Pt presents with mid/upper abdominal pain, nausea, and vomiting x 3 days. Pt states she has chronic pancreatitis

## 2011-04-12 NOTE — ED Notes (Signed)
Labs collected with IV start 

## 2011-04-13 ENCOUNTER — Encounter (HOSPITAL_COMMUNITY): Payer: Self-pay | Admitting: Internal Medicine

## 2011-04-13 DIAGNOSIS — Z720 Tobacco use: Secondary | ICD-10-CM | POA: Diagnosis present

## 2011-04-13 DIAGNOSIS — F1123 Opioid dependence with withdrawal: Secondary | ICD-10-CM | POA: Diagnosis present

## 2011-04-13 LAB — HEPATIC FUNCTION PANEL
Bilirubin, Direct: <0.1 mg/dL (ref 0.0–0.3)
Total Bilirubin: 0.2 mg/dL — ABNORMAL LOW (ref 0.3–1.2)

## 2011-04-13 MED ORDER — SODIUM CHLORIDE 0.9 % IJ SOLN
INTRAMUSCULAR | Status: AC
  Administered 2011-04-13: 18:00:00
  Filled 2011-04-13: qty 3

## 2011-04-13 MED ORDER — ZOLPIDEM TARTRATE 5 MG PO TABS
10.0000 mg | ORAL_TABLET | Freq: Every evening | ORAL | Status: DC | PRN
  Administered 2011-04-13 – 2011-04-14 (×2): 10 mg via ORAL
  Filled 2011-04-13 (×2): qty 2

## 2011-04-13 MED ORDER — HYDROMORPHONE HCL PF 1 MG/ML IJ SOLN
1.0000 mg | INTRAMUSCULAR | Status: DC | PRN
  Administered 2011-04-13 – 2011-04-15 (×16): 1 mg via INTRAVENOUS
  Filled 2011-04-13 (×17): qty 1

## 2011-04-13 MED ORDER — NICOTINE 14 MG/24HR TD PT24
14.0000 mg | MEDICATED_PATCH | Freq: Every day | TRANSDERMAL | Status: DC
  Administered 2011-04-13 – 2011-04-15 (×3): 14 mg via TRANSDERMAL
  Filled 2011-04-13 (×3): qty 1

## 2011-04-13 MED ORDER — PROMETHAZINE HCL 25 MG/ML IJ SOLN
12.5000 mg | Freq: Four times a day (QID) | INTRAMUSCULAR | Status: DC | PRN
  Administered 2011-04-13 – 2011-04-15 (×8): 12.5 mg via INTRAVENOUS
  Filled 2011-04-13 (×9): qty 1

## 2011-04-13 MED ORDER — ACETAMINOPHEN 325 MG PO TABS: 650.0000 mg | ORAL_TABLET | Freq: Four times a day (QID) | ORAL | Status: DC | PRN

## 2011-04-13 MED ORDER — POTASSIUM CHLORIDE IN NACL 20-0.9 MEQ/L-% IV SOLN
INTRAVENOUS | Status: DC
  Administered 2011-04-13 – 2011-04-15 (×5): via INTRAVENOUS

## 2011-04-13 MED ORDER — SODIUM CHLORIDE 0.9 % IJ SOLN
INTRAMUSCULAR | Status: AC
  Administered 2011-04-13: 10 mL
  Filled 2011-04-13: qty 3

## 2011-04-13 MED ORDER — HYDROMORPHONE HCL PF 1 MG/ML IJ SOLN
1.0000 mg | Freq: Once | INTRAMUSCULAR | Status: AC
  Administered 2011-04-13: 1 mg via INTRAVENOUS
  Filled 2011-04-13: qty 1

## 2011-04-13 MED ORDER — ACETAMINOPHEN 650 MG RE SUPP: 650.0000 mg | Freq: Four times a day (QID) | RECTAL | Status: DC | PRN

## 2011-04-13 MED ORDER — ENOXAPARIN SODIUM 40 MG/0.4ML ~~LOC~~ SOLN
40.0000 mg | Freq: Every day | SUBCUTANEOUS | Status: DC
  Filled 2011-04-13: qty 0.4

## 2011-04-13 MED ORDER — PROMETHAZINE HCL 12.5 MG PO TABS
12.5000 mg | ORAL_TABLET | Freq: Four times a day (QID) | ORAL | Status: DC | PRN
  Administered 2011-04-15: 12.5 mg via ORAL
  Filled 2011-04-13: qty 1

## 2011-04-13 MED ORDER — ALBUTEROL SULFATE (5 MG/ML) 0.5% IN NEBU: 2.5000 mg | INHALATION_SOLUTION | RESPIRATORY_TRACT | Status: DC | PRN

## 2011-04-13 MED ORDER — INFLUENZA VIRUS VACC SPLIT PF IM SUSP
0.5000 mL | Freq: Once | INTRAMUSCULAR | Status: AC
  Administered 2011-04-13: 0.5 mL via INTRAMUSCULAR
  Filled 2011-04-13: qty 0.5

## 2011-04-13 MED ORDER — OXYCODONE HCL 5 MG PO TABS
5.0000 mg | ORAL_TABLET | ORAL | Status: DC | PRN
  Administered 2011-04-13 – 2011-04-15 (×4): 5 mg via ORAL
  Filled 2011-04-13 (×5): qty 1

## 2011-04-13 MED ORDER — DIPHENHYDRAMINE HCL 25 MG PO CAPS
25.0000 mg | ORAL_CAPSULE | ORAL | Status: DC | PRN
  Administered 2011-04-13 – 2011-04-15 (×4): 25 mg via ORAL
  Filled 2011-04-13 (×4): qty 1

## 2011-04-13 MED ORDER — SODIUM CHLORIDE 0.9 % IJ SOLN
INTRAMUSCULAR | Status: AC
  Filled 2011-04-13: qty 6

## 2011-04-13 MED ORDER — PANTOPRAZOLE SODIUM 40 MG IV SOLR
40.0000 mg | Freq: Every day | INTRAVENOUS | Status: DC
  Administered 2011-04-13 – 2011-04-14 (×2): 40 mg via INTRAVENOUS
  Filled 2011-04-13: qty 40
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
## 2011-04-13 NOTE — ED Notes (Signed)
Did have some relief with Dilaudid - pain decrease from 9 to a 6 NOW pain is returning and is at an 8  MD notified

## 2011-04-13 NOTE — Progress Notes (Signed)
[id number] 
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
## 2011-04-14 ENCOUNTER — Encounter (HOSPITAL_COMMUNITY): Payer: Self-pay | Admitting: Gastroenterology

## 2011-04-14 DIAGNOSIS — K859 Acute pancreatitis without necrosis or infection, unspecified: Secondary | ICD-10-CM

## 2011-04-14 LAB — COMPREHENSIVE METABOLIC PANEL
ALT: 8 U/L (ref 0–35)
Albumin: 4.2 g/dL (ref 3.5–5.2)
Alkaline Phosphatase: 74 U/L (ref 39–117)
BUN: 5 mg/dL — ABNORMAL LOW (ref 6–23)
Chloride: 103 mEq/L (ref 96–112)
GFR calc Af Amer: >90 mL/min (ref >90)
Glucose, Bld: 98 mg/dL (ref 70–99)
Potassium: 4.1 mEq/L (ref 3.5–5.1)
Sodium: 139 mEq/L (ref 135–145)
Total Bilirubin: 0.4 mg/dL (ref 0.3–1.2)
Total Protein: 7.4 g/dL (ref 6.0–8.3)

## 2011-04-14 LAB — CBC
HCT: 40.8 % (ref 36.0–46.0)
MCHC: 33.3 g/dL (ref 30.0–36.0)
MCV: 92.5 fL (ref 78.0–100.0)
Platelets: 308 10*3/uL (ref 150–400)
RDW: 14.8 % (ref 11.5–15.5)
WBC: 7.7 10*3/uL (ref 4.0–10.5)

## 2011-04-14 LAB — DIFFERENTIAL
Basophils Absolute: 0 10*3/uL (ref 0.0–0.1)
Basophils Relative: 1 % (ref 0–1)
Eosinophils Absolute: 0.3 10*3/uL (ref 0.0–0.7)
Neutro Abs: 3.3 10*3/uL (ref 1.7–7.7)
Neutrophils Relative %: 45 % (ref 43–77)

## 2011-04-14 LAB — LIPASE, BLOOD: Lipase: 68 U/L — ABNORMAL HIGH (ref 11–59)

## 2011-04-14 MED ORDER — SODIUM CHLORIDE 0.9 % IJ SOLN
INTRAMUSCULAR | Status: AC
  Filled 2011-04-14: qty 3

## 2011-04-14 MED ORDER — PANTOPRAZOLE SODIUM 40 MG PO TBEC
40.0000 mg | DELAYED_RELEASE_TABLET | Freq: Every day | ORAL | Status: DC
  Administered 2011-04-15: 40 mg via ORAL
  Filled 2011-04-14: qty 1

## 2011-04-14 MED ORDER — SODIUM CHLORIDE 0.9 % IJ SOLN
INTRAMUSCULAR | Status: AC
  Filled 2011-04-14: qty 6
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
## 2011-04-14 NOTE — Progress Notes (Signed)
[id number] 

## 2011-04-14 NOTE — Progress Notes (Signed)
CARE MANAGEMENT NOTE 04/14/2011  Patient:  [name], [name]   Account Number:  [id number]  Date Initiated:  04/14/2011  Documentation initiated by:  [name]  Subjective/Objective Assessment:   Pt admitted from home with husband with acute pancreatitis. Requested assistance with Medication upon DC.     Action/Plan:   CM spoke to pt at bedside. Discussed DC meds and reminded her that we would not be able to assist with pain meds and doubtful about Sleep med. Phenergan will be researched for assistance.   Anticipated DC Date:  04/16/2011   Anticipated DC Plan:  HOME/SELF CARE  In-house referral  Financial Counselor      DC Planning Services  CM consult      Choice offered to / List presented to:             Status of service:  In process, will continue to follow Medicare Important Message given?   (If response is "NO", the following Medicare IM given date fields will be blank) Date Medicare IM given:   Date Additional Medicare IM given:    Discharge Disposition:    Per UR Regulation:    Comments:  04/14/11 1200 Latrail Pounders Leanord Hawking RN BSN CM Lubertha Basque with Financiial spoke with husband about Medicaid application. CM spoke with pt after and pt was confused how the Medicaid process worked. Left message again with Kathie Rhodes to call and discuss with patient. CM to Follow.

## 2011-04-14 NOTE — Progress Notes (Signed)
NAME:  LEANNAH, GUSE NO.:  [id number]  MEDICAL RECORD NO.:  [id number]  LOCATION:  A320                          FACILITY:  [facility]  PHYSICIAN:  Melvyn Novas, MDDATE OF BIRTH:  [birth year]  DATE OF PROCEDURE: DATE OF DISCHARGE:                                PROGRESS NOTE   The patient has acute pancreatitis, day 2-1/2.  Does not imbibe alcohol. Has pancreas divisum.  Temperature 97.9, pulse is 63 and regular, respiratory rate is 20, blood pressure 111/73, hemoglobin 13.1, WBC 8.3. Potassium is 3.5.  Awaiting this morning's amylase, the patient has significant epigastric discomfort.  She had minimal pain yesterday.  She also has some nausea which I believe may be secondary to Dilaudid.  I have told her to separate 2 symptoms, one from Dilaudid, the other from pancreatitis.  Lungs are clear.  Heart unremarkable.  The patient is awake, alert, and oriented.  Quite anxious.  She tolerated clear liquids fairly well yesterday.  We will continue clear liquids gently and see what is the response to her is based on clinical symptomatology, hemoglobin, hematocrit, lipase, and epigastric discomfort.  Continue Protonix 40 IV.     Melvyn Novas, MD     RMD/MEDQ  D:  04/14/2011  T:  04/14/2011  Job:  [id number]

## 2011-04-15 ENCOUNTER — Telehealth: Payer: Self-pay | Admitting: Gastroenterology

## 2011-04-15 LAB — DIFFERENTIAL
Basophils Absolute: 0 10*3/uL (ref 0.0–0.1)
Basophils Relative: 0 % (ref 0–1)
Eosinophils Absolute: 0.2 10*3/uL (ref 0.0–0.7)
Neutro Abs: 1.8 10*3/uL (ref 1.7–7.7)
Neutrophils Relative %: 38 % — ABNORMAL LOW (ref 43–77)

## 2011-04-15 LAB — BASIC METABOLIC PANEL
BUN: 5 mg/dL — ABNORMAL LOW (ref 6–23)
Chloride: 104 mEq/L (ref 96–112)
GFR calc Af Amer: >90 mL/min (ref >90)
GFR calc non Af Amer: >90 mL/min (ref >90)
Potassium: 3.9 mEq/L (ref 3.5–5.1)
Sodium: 138 mEq/L (ref 135–145)

## 2011-04-15 LAB — LIPASE, BLOOD: Lipase: 27 U/L (ref 11–59)

## 2011-04-15 MED ORDER — SODIUM CHLORIDE 0.9 % IJ SOLN
INTRAMUSCULAR | Status: AC
  Filled 2011-04-15: qty 3

## 2011-04-15 MED ORDER — LORAZEPAM 1 MG PO TABS: 1.0000 mg | ORAL_TABLET | Freq: Every day | ORAL | Status: DC

## 2011-04-15 MED ORDER — NICOTINE 14 MG/24HR TD PT24: 21.0000 | MEDICATED_PATCH | Freq: Every day | TRANSDERMAL | Status: DC

## 2011-04-15 MED ORDER — LORAZEPAM 1 MG PO TABS: 1.0000 mg | ORAL_TABLET | Freq: Every day | ORAL | Status: AC

## 2011-04-15 NOTE — Telephone Encounter (Signed)
Spoke w/ [name] at [facility]- records have to be faxed first and reviewed before appt can be made since pt has no insurance- they will call us back with appt- All notes faxed to [phone]

## 2011-04-15 NOTE — Discharge Summary (Signed)
[id number] 

## 2011-04-15 NOTE — Progress Notes (Signed)
Subjective: Pt sleeping when entered room. Woke up with nausea. Was on phenergan prior to admission. No vomiting. Abdomen feels tender. Doesn't think she can advance diet yet but willing to try.  Objective: Vital signs in last 24 hours: Temp:  [97.7 F (36.5 C)-98.3 F (36.8 C)] 97.7 F (36.5 C) (12/27 0459) Pulse Rate:  [73-91] 73  (12/27 0459) Resp:  [20] 20  (12/27 0459) BP: (110-114)/(70-79) 112/70 mmHg (12/27 0459) SpO2:  [96 %-100 %] 98 % (12/27 0459) Last BM Date: 04/12/11 General:   Alert and oriented, pleasant Head:  Normocephalic and atraumatic. Eyes:  No icterus, sclera clear. Conjuctiva pink.  Mouth:  Without lesions, mucosa pink and moist.  Heart:  S1, S2 present, no murmurs noted.  Lungs: Clear to auscultation bilaterally, without wheezing, rales, or rhonchi.  Abdomen:  Bowel sounds present, soft, TTP epigastric region, non-distended. No HSM or hernias noted. No rebound or guarding. No masses appreciated  Msk:  Symmetrical without gross deformities. Normal posture. Extremities:  Without clubbing or edema. Neurologic:  Alert and  oriented x4;  grossly normal neurologically. Skin:  Warm and dry, intact without significant lesions.  Psych:  Alert and cooperative. Normal mood and affect.  Intake/Output from previous day: 12/26 0701 - 12/27 0700 In: 720 [P.O.:720] Out: -  Intake/Output this shift:    Lab Results:  Basename 04/14/11 0559 04/12/11 2248  WBC 7.7 8.3  HGB 13.6 13.1  HCT 40.8 38.3  PLT 308 324   BMET  Basename 04/15/11 0449 04/14/11 0559 04/12/11 2248  NA 138 139 136  K 3.9 4.1 3.5  CL 104 103 100  CO2 25 31 27   GLUCOSE 95 98 122*  BUN 5* 5* 16  CREATININE 0.59 0.70 0.70  CALCIUM 9.6 10.3 9.9   LFT  Basename 04/14/11 0559 04/13/11 1031 04/12/11 2248  PROT 7.4 6.2 7.5  ALBUMIN 4.2 3.4* 4.2  AST 11 9 11   ALT 8 6 9   ALKPHOS 74 63 75  BILITOT 0.4 0.2* 0.2*  BILIDIR -- <0.1 --  IBILI -- NOT CALCULATED --   Lipase 12/26: 68 12/27:  27  Assessment: 25 year old female with pancreatitis likely r/t pancreas divisum or variant of sorts, lipase normalized but continued nausea, intermittent epigastric pain. Pt was on phenergan prior to admission. Needs tertiary referral for further work-up.. Does not feel ready to advance diet this morning, but she will notify nurse if ready. Will place advance as tolerate to low-fat in orders.   Plan: Will attempt to set up outpatient appt with [facility] through our office staff Continue clears for breakfast, advance as tolerated to low-fat if able Supportive measures Anticipate d/c home soon    LOS: 3 days   Gerrit Halls  04/15/2011, 8:00 AM

## 2011-04-15 NOTE — Progress Notes (Signed)
PV removed without complaint, patient discharged home. Patient  verbalizes understanding of discharge instructions, follow up care and prescriptions. Patient escorted out by staff, transported by family.

## 2011-04-15 NOTE — Progress Notes (Signed)
CARE MANAGEMENT NOTE 04/15/2011  Patient:  LINETH, THIELKE   Account Number:  [id number]  Date Initiated:  04/14/2011  Documentation initiated by:  Rosemary Holms  Subjective/Objective Assessment:   Pt admitted from home with husband with acute pancreatitis. Requested assistance with Medication upon DC.     Action/Plan:   CM spoke to pt at bedside. Discussed DC meds and reminded her that we would not be able to assist with pain meds and doubtful about Sleep med. Phenergan will be researched for assistance.   Anticipated DC Date:  04/16/2011   Anticipated DC Plan:  HOME/SELF CARE  In-house referral  Financial Counselor      DC Planning Services  CM consult      Choice offered to / List presented to:             Status of service:  Completed, signed off Medicare Important Message given?   (If response is "NO", the following Medicare IM given date fields will be blank) Date Medicare IM given:   Date Additional Medicare IM given:    Discharge Disposition:    Per UR Regulation:    Comments:  04/15/11 1510 Estefanie Cornforth Leanord Hawking RN BSN CM Pt discharged without requesting assistance with meds.  04/14/11 1200 Addilynne Olheiser Leanord Hawking RN BSN CM Lubertha Basque with Financiial spoke with husband about Medicaid application. CM spoke with pt after and pt was confused how the Medicaid process worked. Left message again with Kathie Rhodes to call and discuss with patient. CM to Follow.
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
## 2011-04-15 NOTE — Telephone Encounter (Signed)
Inpatient at [facility]. Has hx of pancreatitis likely secondary to pancreas divisum variant. Has been referred to [facility] multiple times in past but unable to follow through due to moving and most recently insurance status.  We need to arrange an outpatient referral for her to [facility] for further work-up. Thanks!

## 2011-04-24 ENCOUNTER — Inpatient Hospital Stay (HOSPITAL_COMMUNITY)
Admission: EM | Admit: 2011-04-24 | Discharge: 2011-04-28 | DRG: 440 | Disposition: A | Discharge status: Home / Self Care | Payer: Self-pay | Attending: Family Medicine | Admitting: Family Medicine

## 2011-04-24 ENCOUNTER — Encounter (HOSPITAL_COMMUNITY): Payer: Self-pay

## 2011-04-24 DIAGNOSIS — K2960 Other gastritis without bleeding: Secondary | ICD-10-CM | POA: Diagnosis present

## 2011-04-24 DIAGNOSIS — K8689 Other specified diseases of pancreas: Secondary | ICD-10-CM | POA: Diagnosis present

## 2011-04-24 DIAGNOSIS — K859 Acute pancreatitis without necrosis or infection, unspecified: Secondary | ICD-10-CM

## 2011-04-24 DIAGNOSIS — R109 Unspecified abdominal pain: Secondary | ICD-10-CM

## 2011-04-24 DIAGNOSIS — Z791 Long term (current) use of non-steroidal anti-inflammatories (NSAID): Secondary | ICD-10-CM

## 2011-04-24 DIAGNOSIS — K861 Other chronic pancreatitis: Principal | ICD-10-CM | POA: Diagnosis present

## 2011-04-24 HISTORY — DX: Major depressive disorder, single episode, unspecified: F32.9

## 2011-04-24 HISTORY — DX: Depression, unspecified: F32.A

## 2011-04-24 LAB — COMPREHENSIVE METABOLIC PANEL
ALT: 25 U/L (ref 0–35)
AST: 17 U/L (ref 0–37)
Albumin: 4.6 g/dL (ref 3.5–5.2)
Alkaline Phosphatase: 89 U/L (ref 39–117)
Potassium: 4.1 mEq/L (ref 3.5–5.1)
Sodium: 142 mEq/L (ref 135–145)
Total Protein: 8.1 g/dL (ref 6.0–8.3)

## 2011-04-24 LAB — URINALYSIS, ROUTINE W REFLEX MICROSCOPIC
Ketones, ur: NEGATIVE mg/dL
Leukocytes, UA: NEGATIVE
Nitrite: NEGATIVE
Protein, ur: NEGATIVE mg/dL
pH: 8.5 — ABNORMAL HIGH (ref 5.0–8.0)

## 2011-04-24 LAB — DIFFERENTIAL
Basophils Relative: 0 % (ref 0–1)
Eosinophils Absolute: 0.1 10*3/uL (ref 0.0–0.7)
Neutrophils Relative %: 55 % (ref 43–77)

## 2011-04-24 LAB — CBC
MCH: 29.5 pg (ref 26.0–34.0)
MCHC: 32.1 g/dL (ref 30.0–36.0)
Platelets: 339 10*3/uL (ref 150–400)
RBC: 4.54 MIL/uL (ref 3.87–5.11)

## 2011-04-24 MED ORDER — OXYCODONE HCL 5 MG PO TABS
5.0000 mg | ORAL_TABLET | ORAL | Status: DC | PRN
  Administered 2011-04-26: 5 mg via ORAL
  Filled 2011-04-24: qty 1

## 2011-04-24 MED ORDER — PROMETHAZINE HCL 25 MG/ML IJ SOLN
12.5000 mg | Freq: Once | INTRAMUSCULAR | Status: AC
  Administered 2011-04-24: 12.5 mg via INTRAVENOUS
  Filled 2011-04-24: qty 1

## 2011-04-24 MED ORDER — SODIUM CHLORIDE 0.9 % IV SOLN: Freq: Once | INTRAVENOUS | Status: DC

## 2011-04-24 MED ORDER — HYDROMORPHONE HCL PF 1 MG/ML IJ SOLN
1.0000 mg | Freq: Once | INTRAMUSCULAR | Status: AC
  Administered 2011-04-24: 1 mg via INTRAVENOUS
  Filled 2011-04-24: qty 1

## 2011-04-24 MED ORDER — ACETAMINOPHEN 325 MG PO TABS: 650.0000 mg | ORAL_TABLET | Freq: Four times a day (QID) | ORAL | Status: DC | PRN

## 2011-04-24 MED ORDER — DIPHENHYDRAMINE HCL 50 MG/ML IJ SOLN: 25.0000 mg | Freq: Once | INTRAMUSCULAR | Status: DC

## 2011-04-24 MED ORDER — PANTOPRAZOLE SODIUM 40 MG IV SOLR
40.0000 mg | INTRAVENOUS | Status: DC
  Administered 2011-04-24 – 2011-04-26 (×3): 40 mg via INTRAVENOUS
  Filled 2011-04-24 (×3): qty 40

## 2011-04-24 MED ORDER — SODIUM CHLORIDE 0.9 % IV SOLN
INTRAVENOUS | Status: DC
  Administered 2011-04-24 – 2011-04-27 (×4): via INTRAVENOUS
  Administered 2011-04-27: 1000 mL via INTRAVENOUS
  Administered 2011-04-27: 05:00:00 via INTRAVENOUS
  Administered 2011-04-28: 1000 mL via INTRAVENOUS

## 2011-04-24 MED ORDER — LORAZEPAM 1 MG PO TABS
1.0000 mg | ORAL_TABLET | Freq: Every day | ORAL | Status: DC
  Administered 2011-04-25 – 2011-04-27 (×3): 1 mg via ORAL
  Filled 2011-04-24 (×3): qty 1

## 2011-04-24 MED ORDER — PANTOPRAZOLE SODIUM 40 MG IV SOLR
40.0000 mg | Freq: Once | INTRAVENOUS | Status: AC
  Administered 2011-04-24: 40 mg via INTRAVENOUS
  Filled 2011-04-24: qty 40

## 2011-04-24 MED ORDER — DIPHENHYDRAMINE HCL 50 MG/ML IJ SOLN
25.0000 mg | Freq: Once | INTRAMUSCULAR | Status: AC
  Administered 2011-04-24: 25 mg via INTRAVENOUS
  Filled 2011-04-24: qty 1

## 2011-04-24 MED ORDER — ZOLPIDEM TARTRATE 5 MG PO TABS
5.0000 mg | ORAL_TABLET | Freq: Every evening | ORAL | Status: DC | PRN
  Administered 2011-04-24 – 2011-04-25 (×2): 5 mg via ORAL
  Filled 2011-04-24 (×2): qty 1

## 2011-04-24 MED ORDER — ACETAMINOPHEN 650 MG RE SUPP: 650.0000 mg | Freq: Four times a day (QID) | RECTAL | Status: DC | PRN

## 2011-04-24 MED ORDER — ONDANSETRON HCL 4 MG/2ML IJ SOLN
4.0000 mg | Freq: Four times a day (QID) | INTRAMUSCULAR | Status: DC | PRN
  Administered 2011-04-25 – 2011-04-28 (×10): 4 mg via INTRAVENOUS
  Filled 2011-04-24 (×10): qty 2

## 2011-04-24 MED ORDER — HYDROMORPHONE HCL PF 1 MG/ML IJ SOLN
1.0000 mg | INTRAMUSCULAR | Status: DC | PRN
  Administered 2011-04-24 – 2011-04-28 (×30): 1 mg via INTRAVENOUS
  Filled 2011-04-24 (×32): qty 1

## 2011-04-24 MED ORDER — HYDROMORPHONE HCL 1 MG/ML IJ SOLN
1.0000 mg | Freq: Once | INTRAMUSCULAR | Status: AC
  Administered 2011-04-24: 1 mg via INTRAVENOUS
  Filled 2011-04-24: qty 1

## 2011-04-24 MED ORDER — DIPHENHYDRAMINE HCL 50 MG/ML IJ SOLN
12.5000 mg | Freq: Four times a day (QID) | INTRAMUSCULAR | Status: DC | PRN
  Administered 2011-04-25 – 2011-04-28 (×9): 12.5 mg via INTRAVENOUS
  Filled 2011-04-24 (×9): qty 1

## 2011-04-24 MED ORDER — SODIUM CHLORIDE 0.9 % IV BOLUS (SEPSIS)
1000.0000 mL | Freq: Once | INTRAVENOUS | Status: AC
  Administered 2011-04-24: 1000 mL via INTRAVENOUS

## 2011-04-24 MED ORDER — NICOTINE 14 MG/24HR TD PT24
294.0000 mg | MEDICATED_PATCH | Freq: Every day | TRANSDERMAL | Status: DC
  Administered 2011-04-25: 294 mg via TRANSDERMAL
  Administered 2011-04-26: 21 mg via TRANSDERMAL
  Filled 2011-04-24 (×3): qty 1

## 2011-04-24 MED ORDER — ENOXAPARIN SODIUM 40 MG/0.4ML ~~LOC~~ SOLN
40.0000 mg | SUBCUTANEOUS | Status: DC
  Filled 2011-04-24: qty 0.4

## 2011-04-24 MED ORDER — ONDANSETRON HCL 4 MG PO TABS
4.0000 mg | ORAL_TABLET | Freq: Four times a day (QID) | ORAL | Status: DC | PRN
  Filled 2011-04-24: qty 1

## 2011-04-24 MED ORDER — PROMETHAZINE HCL 25 MG/ML IJ SOLN
12.5000 mg | Freq: Once | INTRAMUSCULAR | Status: AC
  Administered 2011-04-24: 25 mg via INTRAVENOUS
  Filled 2011-04-24: qty 1

## 2011-04-24 NOTE — ED Notes (Signed)
Pt presents with epigastric pain and n/v since yesterday. Pt states she is having a flare up of her Pancreatitis.
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
## 2011-04-24 NOTE — ED Notes (Signed)
Report given to receiving RN. Pt ready for transport. 

## 2011-04-25 DIAGNOSIS — R112 Nausea with vomiting, unspecified: Secondary | ICD-10-CM

## 2011-04-25 DIAGNOSIS — Z8719 Personal history of other diseases of the digestive system: Secondary | ICD-10-CM

## 2011-04-25 DIAGNOSIS — R1013 Epigastric pain: Secondary | ICD-10-CM

## 2011-04-25 LAB — CBC
Platelets: 242 10*3/uL (ref 150–400)
RBC: 3.88 MIL/uL (ref 3.87–5.11)
WBC: 6.2 10*3/uL (ref 4.0–10.5)

## 2011-04-25 LAB — BASIC METABOLIC PANEL
Calcium: 9.2 mg/dL (ref 8.4–10.5)
GFR calc non Af Amer: >90 mL/min (ref >90)
Potassium: 3.7 mEq/L (ref 3.5–5.1)
Sodium: 139 mEq/L (ref 135–145)

## 2011-04-25 LAB — MRSA PCR SCREENING: MRSA by PCR: NEGATIVE

## 2011-04-25 LAB — LIPASE, BLOOD: Lipase: 32 U/L (ref 11–59)

## 2011-04-25 LAB — AMYLASE: Amylase: 57 U/L (ref 0–105)

## 2011-04-25 MED ORDER — SODIUM CHLORIDE 0.9 % IJ SOLN
INTRAMUSCULAR | Status: AC
  Filled 2011-04-25: qty 3

## 2011-04-25 NOTE — Consult Note (Signed)
Consult note dictated. Job; [phone] Patient seen for nausea vomiting and epigastric pain. She has history of multiple episodes of pancreatitis in the past and question of pancreas divisum.

## 2011-04-25 NOTE — Progress Notes (Signed)
Subjective: She was admitted with abdominal pain. Her lipase and amylase are normal still. She may have some other abdominal problems on going to go ahead and has for GI  Objective: Vital signs in last 24 hours: Temp:  [97.5 F (36.4 C)-98.4 F (36.9 C)] 97.5 F (36.4 C) (01/06 0556) Pulse Rate:  [62-87] 62  (01/06 0556) Resp:  [14-20] 16  (01/06 0556) BP: (103-124)/(59-81) 112/79 mmHg (01/06 0556) SpO2:  [95 %-100 %] 95 % (01/06 0556) Weight:  [50.803 kg (112 lb)-52.3 kg (115 lb 4.8 oz)] 115 lb 4.8 oz (52.3 kg) (01/05 2200) Weight change:  Last BM Date: 04/23/11  Intake/Output from previous day: 01/05 0701 - 01/06 0700 In: 240 [P.O.:240] Out: -   PHYSICAL EXAM General appearance: alert, cooperative and mild distress Resp: clear to auscultation bilaterally Cardio: regular rate and rhythm, S1, S2 normal, no murmur, click, rub or gallop GI: soft, non-tender; bowel sounds normal; no masses,  no organomegaly Extremities: extremities normal, atraumatic, no cyanosis or edema  Lab Results:    Basic Metabolic Panel:  Basename 04/25/11 0704 04/24/11 1430  NA 139 142  K 3.7 4.1  CL 105 102  CO2 30 32  GLUCOSE 94 103*  BUN 7 9  CREATININE 0.60 0.53  CALCIUM 9.2 11.4*  MG -- --  PHOS -- --   Liver Function Tests:  Basename 04/24/11 1430  AST 17  ALT 25  ALKPHOS 89  BILITOT 0.2*  PROT 8.1  ALBUMIN 4.6    Basename 04/25/11 0704 04/24/11 1430  LIPASE 32 35  AMYLASE 57 87   No results found for this basename: AMMONIA:2 in the last 72 hours CBC:  Basename 04/25/11 0704 04/24/11 1430  WBC 6.2 7.0  NEUTROABS -- 3.8  HGB 11.9* 13.4  HCT 35.8* 41.8  MCV 92.3 92.1  PLT 242 339   Cardiac Enzymes: No results found for this basename: CKTOTAL:3,CKMB:3,CKMBINDEX:3,TROPONINI:3 in the last 72 hours BNP: No results found for this basename: PROBNP:3 in the last 72 hours D-Dimer: No results found for this basename: DDIMER:2 in the last 72 hours CBG: No results found  for this basename: GLUCAP:6 in the last 72 hours Hemoglobin A1C: No results found for this basename: HGBA1C in the last 72 hours Fasting Lipid Panel: No results found for this basename: CHOL,HDL,LDLCALC,TRIG,CHOLHDL,LDLDIRECT in the last 72 hours Thyroid Function Tests: No results found for this basename: TSH,T4TOTAL,FREET4,T3FREE,THYROIDAB in the last 72 hours Anemia Panel: No results found for this basename: VITAMINB12,FOLATE,FERRITIN,TIBC,IRON,RETICCTPCT in the last 72 hours Coagulation: No results found for this basename: LABPROT:2,INR:2 in the last 72 hours Urine Drug Screen: Drugs of Abuse     Component Value Date/Time   LABOPIA NEG 08/24/2010 1435   LABOPIA  Value: POSITIVE (NOTE) Sent for confirmatory testing Result repeated and verified.* 06/23/2010 0010   COCAINSCRNUR NEG 08/24/2010 1435   COCAINSCRNUR NONE DETECTED 07/28/2010 0150   LABBENZ POS* 08/24/2010 1435   LABBENZ NONE DETECTED 07/28/2010 0150   AMPHETMU NEG 08/24/2010 1435   AMPHETMU NONE DETECTED 07/28/2010 0150   THCU POSITIVE* 07/28/2010 0150   LABBARB  Value: NONE DETECTED        DRUG SCREEN FOR MEDICAL PURPOSES ONLY.  IF CONFIRMATION IS NEEDED FOR ANY PURPOSE, NOTIFY LAB WITHIN 5 DAYS.        LOWEST DETECTABLE LIMITS FOR URINE DRUG SCREEN Drug Class       Cutoff (ng/mL) Amphetamine      1000 Barbiturate      200 Benzodiazepine   200 Tricyclics  300 Opiates          300 Cocaine          300 THC              50 07/28/2010 0150    Alcohol Level: No results found for this basename: ETH:2 in the last 72 hours Urinalysis:  Misc. Labs:  ABGS No results found for this basename: PHART,PCO2,PO2ART,TCO2,HCO3 in the last 72 hours CULTURES Recent Results (from the past 240 hour(s))  MRSA PCR SCREENING     Status: Normal   Collection Time   04/25/11  9:30 AM      Component Value Range Status Comment   MRSA by PCR NEGATIVE  NEGATIVE  Final    Studies/Results: No results found.  Medications:  Scheduled:   . sodium chloride    Intravenous Once  . diphenhydrAMINE  25 mg Intravenous Once  . enoxaparin  40 mg Subcutaneous Q24H  . HYDROmorphone  1 mg Intravenous Once  . HYDROmorphone  1 mg Intravenous Once  . HYDROmorphone  1 mg Intravenous Once  . HYDROmorphone  1 mg Intravenous Once  . LORazepam  1 mg Oral QHS  . nicotine  294 mg Transdermal Daily  . pantoprazole (PROTONIX) IV  40 mg Intravenous Once  . pantoprazole (PROTONIX) IV  40 mg Intravenous Q24H  . promethazine  12.5 mg Intravenous Once  . promethazine  12.5 mg Intravenous Once  . sodium chloride  1,000 mL Intravenous Once  . DISCONTD: diphenhydrAMINE  25 mg Intravenous Once   Continuous:   . sodium chloride 125 mL/hr at 04/24/11 2324   YQM:[id number], acetaminophen, diphenhydrAMINE, HYDROmorphone, ondansetron (ZOFRAN) IV, ondansetron, oxyCODONE, zolpidem  Assesment: She has abdominal pain but no definite evidence of pancreatitis. She may have some other problem such as gastritis. Active Problems:  * No active hospital problems. *     Plan: She will continue current medications and I will asked for GI consultation    LOS: 1 day   [name] L 04/25/2011, 11:13 AM
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
## 2011-04-26 ENCOUNTER — Encounter (HOSPITAL_COMMUNITY): Payer: Self-pay | Admitting: Anesthesiology

## 2011-04-26 ENCOUNTER — Inpatient Hospital Stay (HOSPITAL_COMMUNITY): Payer: Self-pay

## 2011-04-26 ENCOUNTER — Encounter (HOSPITAL_COMMUNITY): Payer: Self-pay | Admitting: *Deleted

## 2011-04-26 ENCOUNTER — Other Ambulatory Visit: Payer: Self-pay | Admitting: Internal Medicine

## 2011-04-26 ENCOUNTER — Encounter (HOSPITAL_COMMUNITY)
Admission: EM | Disposition: A | Discharge status: Home / Self Care | Payer: Self-pay | Source: Home / Self Care | Attending: Family Medicine

## 2011-04-26 HISTORY — PX: ESOPHAGOGASTRODUODENOSCOPY: SHX5428

## 2011-04-26 LAB — BASIC METABOLIC PANEL
Chloride: 99 mEq/L (ref 96–112)
Creatinine, Ser: 0.62 mg/dL (ref 0.50–1.10)
GFR calc Af Amer: >90 mL/min (ref >90)
Potassium: 3.4 mEq/L — ABNORMAL LOW (ref 3.5–5.1)
Sodium: 135 mEq/L (ref 135–145)

## 2011-04-26 LAB — HEPATIC FUNCTION PANEL
Indirect Bilirubin: 0.2 mg/dL — ABNORMAL LOW (ref 0.3–0.9)
Total Protein: 7 g/dL (ref 6.0–8.3)

## 2011-04-26 LAB — AMYLASE: Amylase: 71 U/L (ref 0–105)

## 2011-04-26 SURGERY — ESOPHAGOGASTRODUODENOSCOPY (EGD) WITH PROPOFOL
Anesthesia: Monitor Anesthesia Care

## 2011-04-26 SURGERY — EGD (ESOPHAGOGASTRODUODENOSCOPY)
Anesthesia: Moderate Sedation

## 2011-04-26 MED ORDER — FLUMAZENIL 0.5 MG/5ML IV SOLN
0.5000 mg | Freq: Once | INTRAVENOUS | Status: AC
  Administered 2011-04-26: 0.5 mg via INTRAVENOUS

## 2011-04-26 MED ORDER — SODIUM CHLORIDE 0.9 % IJ SOLN
INTRAMUSCULAR | Status: AC
  Filled 2011-04-26: qty 3

## 2011-04-26 MED ORDER — STERILE WATER FOR IRRIGATION IR SOLN
Status: DC | PRN
  Administered 2011-04-26: 14:00:00

## 2011-04-26 MED ORDER — MIDAZOLAM HCL 5 MG/5ML IJ SOLN
INTRAMUSCULAR | Status: DC | PRN
  Administered 2011-04-26 (×6): 2 mg via INTRAVENOUS

## 2011-04-26 MED ORDER — MEPERIDINE HCL 100 MG/ML IJ SOLN
INTRAMUSCULAR | Status: AC
  Filled 2011-04-26: qty 1

## 2011-04-26 MED ORDER — FLUMAZENIL 0.5 MG/5ML IV SOLN
INTRAVENOUS | Status: AC
  Administered 2011-04-26: 0.5 mg via INTRAVENOUS
  Filled 2011-04-26: qty 5

## 2011-04-26 MED ORDER — BUTAMBEN-TETRACAINE-BENZOCAINE 2-2-14 % EX AERO
INHALATION_SPRAY | CUTANEOUS | Status: DC | PRN
  Administered 2011-04-26: 2 via TOPICAL

## 2011-04-26 MED ORDER — MIDAZOLAM HCL 5 MG/5ML IJ SOLN
INTRAMUSCULAR | Status: AC
  Filled 2011-04-26: qty 5

## 2011-04-26 MED ORDER — MIDAZOLAM HCL 5 MG/5ML IJ SOLN
INTRAMUSCULAR | Status: AC
  Filled 2011-04-26: qty 10

## 2011-04-26 MED ORDER — PROMETHAZINE HCL 25 MG/ML IJ SOLN
25.0000 mg | Freq: Once | INTRAMUSCULAR | Status: AC
  Administered 2011-04-26: 25 mg via INTRAVENOUS
  Filled 2011-04-26: qty 1

## 2011-04-26 MED ORDER — MEPERIDINE HCL 100 MG/ML IJ SOLN
INTRAMUSCULAR | Status: AC
  Filled 2011-04-26: qty 2

## 2011-04-26 MED ORDER — MEPERIDINE HCL 100 MG/ML IJ SOLN
INTRAMUSCULAR | Status: DC | PRN
  Administered 2011-04-26 (×4): 50 mg via INTRAVENOUS

## 2011-04-26 NOTE — Progress Notes (Signed)
[id number] 

## 2011-04-26 NOTE — Progress Notes (Signed)
Unresponsive. Romazicon 0.5mg  iv given as ordered.

## 2011-04-26 NOTE — Progress Notes (Signed)
Continues to be unresponsive. Dr Ellison Carwin notified. New order given.

## 2011-04-26 NOTE — Progress Notes (Signed)
From endo suite. Connected to monitors. No response from verbal or touch or sternal rub stimuli. Cold cloth to face. No response. resp adequate/nonlabored. Iv infusing without difficulty.

## 2011-04-26 NOTE — Progress Notes (Signed)
Pt received from endo. Pt ambulated to Bathroom. Pt states her pain is at 10-on 1-10 scale. States she knows she can not have pain medication now but wants it as soon as available. Pt sitting on bed playing cards with husband, no distress noted

## 2011-04-26 NOTE — Progress Notes (Signed)
Awake. Placed on bedpan. Voided 700 ml of clear yellow urine. Voices no c/o at this time.

## 2011-04-26 NOTE — Progress Notes (Signed)
Pt continues to be non-responsive. Dr Ellison Carwin in to check pt. No new orders. Will continue to monitor.

## 2011-04-26 NOTE — Op Note (Signed)
[facility] [address] [place], [place]  [address]  ENDOSCOPY PROCEDURE REPORT  PATIENT:  [name], [name]  MR#:  [id number] BIRTHDATE:  29-Aug-1985, 25 yrs. old  GENDER:  female  ENDOSCOPIST:  R. Roetta Sessions, MD [facility] Referred by:          Dr. Juanetta Gosling / [place]  PROCEDURE DATE:  04/26/2011 PROCEDURE:  EGD with gastric biopsy  INDICATIONS:  abdominal pain; history of pancreatitis; history of NSAID use.  INFORMED CONSENT:   The risks, benefits, limitations, alternatives and imponderables have been discussed.  The potential for biopsy, esophogeal dilation, etc. have also been reviewed.  Questions have been answered.  All parties agreeable.  Please see the history and physical in the medical record for more information.  MEDICATIONS:  Versed 12 mg IV and Demerol 200 mg in divided doses.  DESCRIPTION OF PROCEDURE:   The EG-[id number] ([id number]) endoscope was introduced through the mouth and advanced to the second portion of the duodenum without difficulty or limitations.  The mucosal surfaces were surveyed very carefully during advancement of the scope and upon withdrawal.  Retroflexion view of the proximal stomach and esophagogastric junction was performed.  <<PROCEDUREIMAGES>>  FINDINGS:  Normal esophagus. Couple small erosions along the greater curvature. No ulcer or infiltrating process. Patent pylorus.     Normal first and second portion of the duodenum.  THERAPEUTIC / DIAGNOSTIC MANEUVERS PERFORMED:   gastric biopsies performed.  COMPLICATIONS:   None  IMPRESSION:    Gastric erosions - likely of no clinical significance - status post biopsy  RECOMMENDATIONS:     Continue clear liquid diet until abdominal pain wanes; then advance slowly as tolerated.  Referral to [facility] health for further evaluation of her pancreas. We did call [facility] today to find out the status of her appointment. They are in receipt of medical records and are in the process of  reviewing. They should              be in contact with the patient regarding an appointment within the next few days.  ______________________________ R. Roetta Sessions, MD [facility]  CC:  Shaune Pollack, MD  n. eSIGNED:   R. Roetta Sessions at 04/26/2011 02:49 PM  [name], [phone]

## 2011-04-26 NOTE — Procedures (Signed)
EGD performed. See endoscopy procedure note report. No significant findings. I have discussed findings with the patient's husband.

## 2011-04-26 NOTE — Procedures (Signed)
See endopro for procedure note. Uneventful EGD. The patient did not wake up initially after 45 minutes of monitoring in the endo unit. She was subsequently transferred to PACU where she was monitored for another 60 minutes. She had entirely normal vital signs but would not respond even to sternal rub. I spoke to Dr. Jayme Cloud who also felt she just needed to be monitored for a while longer without intervention. Not felt to be oversedated per se. However, we decided to give her 0.5 cc of Romazicon to facilitate wakefulness and then monitor another 30 minutes in the PACU. Otherwise, would continue PACU monitoring in the ICU.

## 2011-04-26 NOTE — Progress Notes (Signed)
Awakens easily to name, eyes open. Answers questions appropriately. Voices no c/o at this time. Will continue to monitor.

## 2011-04-26 NOTE — Progress Notes (Signed)
NAMEMarland Kitchen  MADALEN, GAVIN NO.:  [id number]  MEDICAL RECORD NO.:  [id number]  LOCATION:  A332                          FACILITY:  [facility]  PHYSICIAN:  Melvyn Novas, MDDATE OF BIRTH:  03-07-1986  DATE OF PROCEDURE: DATE OF DISCHARGE:                                PROGRESS NOTE   The patient was admitted with recurrent epigastric pain.  She was recently discharged with a diagnosis of pancreatitis, which is recurrent from presumed pancreatic device, and she was sent in for 48 hours.  She just had endoscopy, is a little groggy.  Her lipase is 32 and amylase is 71.  LFTs are normal, potassium 3.4.  WBC 6.4, hemoglobin 11.9.  Blood pressure 113/72, temperature is 97.8, pulse 62 and regular, and respiratory rate is 20.  Ultrasound of abdomen reveals normal architecture of pancreas and no pseudocyst formation.  No other significant abnormalities noted.  Lungs are clear.  Heart unremarkable. Abdomen, epigastric tenderness.  The plan right now is to continue conservative therapy.  Monitor lipase and amylase, and await official results of endoscopy to see what further therapy should be directed.     Melvyn Novas, MD     RMD/MEDQ  D:  04/26/2011  T:  04/26/2011  Job:  [id number]

## 2011-04-26 NOTE — Progress Notes (Signed)
CARE MANAGEMENT NOTE 04/26/2011  Patient:  [name], [name]   Account Number:  [id number]  Date Initiated:  04/26/2011  Documentation initiated by:  Rosemary Holms  Subjective/Objective Assessment:   Patient admitted with pancreatitis. Lives at home with family.     Action/Plan:   Spoke to pt at bedside. Financial Counselor requested. May need assistance with meds.   Anticipated DC Date:  04/28/2011   Anticipated DC Plan:  HOME/SELF CARE  In-house referral  Financial Counselor      DC Planning Services  CM consult      Choice offered to / List presented to:             Status of service:  In process, will continue to follow Medicare Important Message given?   (If response is "NO", the following Medicare IM given date fields will be blank) Date Medicare IM given:   Date Additional Medicare IM given:    Discharge Disposition:    Per UR Regulation:    Comments:  04/26/11 1400 Ihor Meinzer Leanord Hawking RN BNS CM

## 2011-04-27 DIAGNOSIS — Z8719 Personal history of other diseases of the digestive system: Secondary | ICD-10-CM

## 2011-04-27 DIAGNOSIS — R1013 Epigastric pain: Secondary | ICD-10-CM

## 2011-04-27 MED ORDER — PANTOPRAZOLE SODIUM 40 MG PO TBEC
40.0000 mg | DELAYED_RELEASE_TABLET | Freq: Every day | ORAL | Status: DC
  Administered 2011-04-27 – 2011-04-28 (×2): 40 mg via ORAL
  Filled 2011-04-27 (×2): qty 1

## 2011-04-27 MED ORDER — NICOTINE 14 MG/24HR TD PT24
14.0000 mg | MEDICATED_PATCH | Freq: Every day | TRANSDERMAL | Status: DC
  Administered 2011-04-27: 14 mg via TRANSDERMAL
  Filled 2011-04-27: qty 1

## 2011-04-27 MED ORDER — PANTOPRAZOLE SODIUM 40 MG PO TBEC: 40.0000 mg | DELAYED_RELEASE_TABLET | Freq: Every day | ORAL | Status: DC

## 2011-04-27 NOTE — Progress Notes (Signed)
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  Sonogram of pancreas yesterday reveals normal architecture and anatomy.  The EGD yesterday revealed few gastric erosions, some NSAID abuse, and no likely cause.  The plan right now is to increase her diet to low fat diet.  Continue PPIs orally or intravenously and follow her clinically as diet advances.  Her lungs are clear.  Heart unremarkable. Abdomen, mild epigastric tenderness to percussion.  We will follow clinically and repeat lipase in a.m.     Melvyn Novas, MD     RMD/MEDQ  D:  04/27/2011  T:  04/27/2011  Job:  [id number]

## 2011-04-27 NOTE — Progress Notes (Signed)
Subjective: EGD yesterday without significant findings. Biopsy pending. Improved abdominal pain, continued nausea. Still on clears. Tearful, anxious regarding possible etiology to chronic pain.  Objective: Vital signs in last 24 hours: Temp:  [97.8 F (36.6 C)-98.1 F (36.7 C)] 98.1 F (36.7 C) (01/08 0611) Pulse Rate:  [61-85] 70  (01/08 0611) Resp:  [13-27] 20  (01/08 0611) BP: (98-139)/(47-97) 109/74 mmHg (01/08 0611) SpO2:  [95 %-100 %] 95 % (01/08 0611) Last BM Date: 04/24/11 General:   Alert and oriented, tearful but pleasant Head:  Normocephalic and atraumatic. Eyes:  No icterus, sclera clear. Conjuctiva pink.  Mouth:  Without lesions, mucosa pink and moist.  Heart:  S1, S2 present, no murmurs noted.  Lungs: Clear to auscultation bilaterally, without wheezing, rales, or rhonchi.  Abdomen:  Bowel sounds present, soft, TTP epigastric region, non-distended. No HSM or hernias noted. No rebound or guarding. No masses appreciated  Msk:  Symmetrical without gross deformities. Normal posture. Extremities:  Without clubbing or edema. Neurologic:  Alert and  oriented x4;  grossly normal neurologically. Skin:  Warm and dry, intact without significant lesions.  Psych:  Alert and cooperative. Normal mood and affect.  Intake/Output from previous day:   Intake/Output this shift:    Lab Results:  Basename 04/25/11 0704 04/24/11 1430  WBC 6.2 7.0  HGB 11.9* 13.4  HCT 35.8* 41.8  PLT 242 339   BMET  Basename 04/26/11 0516 04/25/11 0704 04/24/11 1430  NA 135 139 142  K 3.4* 3.7 4.1  CL 99 105 102  CO2 29 30 32  GLUCOSE 86 94 103*  BUN 5* 7 9  CREATININE 0.62 0.60 0.53  CALCIUM 9.8 9.2 11.4*   LFT  Basename 04/26/11 0516 04/24/11 1430  PROT 7.0 8.1  ALBUMIN 4.0 4.6  AST 16 17  ALT 16 25  ALKPHOS 79 89  BILITOT 0.3 0.2*  BILIDIR 0.1 --  IBILI 0.2* --    Studies/Results: US Abdomen Complete  04/26/2011  *RADIOLOGY REPORT*  Clinical Data: Upper abdominal pain.   History of pancreatitis.  ABDOMEN ULTRASOUND  Technique:  Complete abdominal ultrasound examination was performed including evaluation of the liver, gallbladder, bile ducts, pancreas, kidneys, spleen, IVC, and abdominal aorta.  Comparison: CT scan from 02/28/2009.  Findings:  Gallbladder:  There is no evidence for gallstones.  No gallbladder wall thickening or pericholecystic fluid.  The sonographer reports no sonographic Murphy's sign.  Common Bile Duct:  Nondilated at 3 mm diameter.  Liver:  Normal.  No focal parenchymal abnormality.  No biliary dilation.  IVC:  Normal.  Pancreas:  Normal.  Spleen:  Normal.  Right kidney:  10.5 cm in long axis.  Normal.  Left kidney:  10.9 cm in long axis.  Normal.  Abdominal Aorta:  No aneurysm.  IMPRESSION: Normal abdominal ultrasound.  Original Report Authenticated By: ERIC A. MANSELL, M.D.    Assessment: 26 year old female with hx of pancreatitis likely r/t pancreas divisum variant with need for further evaluation at [facility] (has not followed through with prior appts), admitted with normal lipase and concern for gastric etiology secondary to chronic NSAID use. EGD with findings of few gastric erosions, biopsy pending, overall benign findings. [place] abd negative. Doubt biliary etiology, needs pancreatic evaluation at [facility]. As of note, no prior HIDA scan found. Proceed with [facility] referral.   Plan: Advance diet as tolerate to low-fat.  Continue PPI Will attempt to expedite [facility] referral; pt without insurance currently Anticipate d/c in next 24 hours    LOS: 3  days   Gerrit Halls  04/27/2011, 8:23 AM

## 2011-04-27 NOTE — Progress Notes (Signed)
REVIEWED.  

## 2011-04-27 NOTE — Progress Notes (Signed)
[id number] 

## 2011-04-28 ENCOUNTER — Telehealth: Payer: Self-pay | Admitting: Gastroenterology

## 2011-04-28 ENCOUNTER — Encounter: Payer: Self-pay | Admitting: Gastroenterology

## 2011-04-28 MED ORDER — AMOXICILL-CLARITHRO-LANSOPRAZ PO MISC: Freq: Two times a day (BID) | ORAL | Status: DC

## 2011-04-28 MED ORDER — PROMETHAZINE HCL 25 MG PO TABS: 25.0000 mg | ORAL_TABLET | Freq: Three times a day (TID) | ORAL | Status: DC | PRN

## 2011-04-28 MED ORDER — LORAZEPAM 1 MG PO TABS: 1.0000 mg | ORAL_TABLET | Freq: Every day | ORAL | Status: AC

## 2011-04-28 NOTE — Discharge Summary (Signed)
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  She was seen in consultation by GI and placed on clear liquids, given Protonix IV/p.o. and with some resolution of symptoms.  EGD revealed minor gastric erosions.  Sonogram of pancreas reveals no architectural distortions, pancreatic pseudocysts, mass effect, or any edema still perplexing.  The patient had clinical resolution, was hemodynamically stable throughout her hospital stay.  Electrolytes and CBC were within normal limits, and the symptoms resolved.  She was urged by Dr. Pecola Leisure to see [facility] to have ERCP with antegrade endoscopic ultrasound for which we do not have the technology here.  The patient and husband are currently uninsured, and this presents a problem with they working with Dr. Pecola Leisure' office to ensure the entry into that health care system for further technology. In the meantime, she is discharged on lorazepam 1 mg p.o. at bedtime, which she had from previous admission, NicoDerm CQ 14 mg per 24 hours, Prilosec 20 mg p.o. daily, and Phenergan 25 mg p.o. t.i.d. p.r.n.  She is to stop taking ferrous sulfate and Advil, and she will follow up in my office in 3-5 days' time.     Melvyn Novas, MD     RMD/MEDQ  D:  04/28/2011  T:  04/28/2011  Job:  [id number]

## 2011-04-28 NOTE — Discharge Summary (Signed)
[id number] 

## 2011-04-28 NOTE — Telephone Encounter (Signed)
H.pylori gastritis on biopsies. Patient actually called earlier today before these results were available, concerned because she was being discharged without anti-emetics or pain medication. At this time, will not prescribe narcotics but treat with Prevpac, as she does have documented H.pylori gastritis. Will allow Phenergan prescription. I attempted to call patient to inform her of this; left message on machine to return call. Prescriptions have been sent to the pharmacy.   She needs to stop her PPI while taking the Prevpac. May resume the PPI after Prevpac completed.

## 2011-04-28 NOTE — Progress Notes (Signed)
Subjective: Pain improving. Complains of nausea. Worried about going home with pain medication and anti-emetics. Tolerating small amounts of low-fat diet, taking it easy.   Objective: Vital signs in last 24 hours: Temp:  [97.4 F (36.3 C)-98.6 F (37 C)] 97.8 F (36.6 C) (01/09 5621) Pulse Rate:  [63-77] 76  (01/09 0633) Resp:  [18-20] 18  (01/09 3086) BP: (124-135)/(32-89) 124/32 mmHg (01/09 0633) SpO2:  [100 %] 100 % (01/09 5784) Last BM Date: 04/24/11 General:   Alert and oriented, pleasant Head:  Normocephalic and atraumatic. Eyes:  No icterus, sclera clear. Conjuctiva pink.  Mouth:  Without lesions, mucosa pink and moist.  Heart:  S1, S2 present, no murmurs noted.  Lungs: Clear to auscultation bilaterally, without wheezing, rales, or rhonchi.  Abdomen:  Bowel sounds present, soft, TTP epigastric region, non-distended. No HSM or hernias noted. No rebound or guarding. No masses appreciated  Msk:  Symmetrical without gross deformities. Normal posture. Extremities:  Without clubbing or edema. Neurologic:  Alert and  oriented x4;  grossly normal neurologically. Skin:  Warm and dry, intact without significant lesions.  Psych:  Alert and cooperative. Normal mood and affect.  Intake/Output from previous day: 01/08 0701 - 01/09 0700 In: 360 [P.O.:360] Out: -  IBMET  Basename 04/26/11 0516  NA 135  K 3.4*  CL 99  CO2 29  GLUCOSE 86  BUN 5*  CREATININE 0.62  CALCIUM 9.8   LFT  Basename 04/26/11 0516  PROT 7.0  ALBUMIN 4.0  AST 16  ALT 16  ALKPHOS 79  BILITOT 0.3  BILIDIR 0.1  IBILI 0.2*    Studies/Results: US Abdomen Complete  04/26/2011  *RADIOLOGY REPORT*  Clinical Data: Upper abdominal pain.  History of pancreatitis.  ABDOMEN ULTRASOUND  Technique:  Complete abdominal ultrasound examination was performed including evaluation of the liver, gallbladder, bile ducts, pancreas, kidneys, spleen, IVC, and abdominal aorta.  Comparison: CT scan from 02/28/2009.  Findings:   Gallbladder:  There is no evidence for gallstones.  No gallbladder wall thickening or pericholecystic fluid.  The sonographer reports no sonographic Murphy's sign.  Common Bile Duct:  Nondilated at 3 mm diameter.  Liver:  Normal.  No focal parenchymal abnormality.  No biliary dilation.  IVC:  Normal.  Pancreas:  Normal.  Spleen:  Normal.  Right kidney:  10.5 cm in long axis.  Normal.  Left kidney:  10.9 cm in long axis.  Normal.  Abdominal Aorta:  No aneurysm.  IMPRESSION: Normal abdominal ultrasound.  Original Report Authenticated By: ERIC A. MANSELL, M.D.    Assessment: 26 year old female with chronic epigastric pain, thought to be related to pancreas divisum variant, with recent EGD showing only gastric erosions. Biopsy pending. Clinically improving. Needs [facility] F/U. Appropriate for d/c today with f/u at [facility]. May need anti-emetics and mild pain medication in interim prior to [facility] appt.   Plan: Continue PPI Low-fat diet Supportive measures Hopefully d/c home today [facility] outpatient f/u  LOS: 4 days   Gerrit Halls  04/28/2011, 8:02 AM

## 2011-04-28 NOTE — Progress Notes (Signed)
Pt discharged home via family; Pt and family given and explained all discharge instructions, carenotes, and prescriptions; pt and family stated understanding and denied questions/concerns; all f/u appointments in place; IV removed without complicaitons; pt stable at time of discharge  

## 2011-04-29 ENCOUNTER — Encounter: Payer: Self-pay | Admitting: Gastroenterology

## 2011-04-29 ENCOUNTER — Telehealth: Payer: Self-pay | Admitting: Gastroenterology

## 2011-04-29 ENCOUNTER — Encounter (HOSPITAL_COMMUNITY): Payer: Self-pay | Admitting: Internal Medicine

## 2011-04-29 MED ORDER — PROMETHAZINE HCL 25 MG RE SUPP: 25.0000 mg | Freq: Four times a day (QID) | RECTAL | Status: DC | PRN

## 2011-04-29 NOTE — Telephone Encounter (Signed)
Pt called and stated prevpac would be 500$. Ginger spoke with pharmacist who stated even with breaking down the contents and doing generic, would be 300.   Will attempt to obtain pylera samples.

## 2011-04-29 NOTE — Telephone Encounter (Signed)
Pt returned call. I discussed with her path reports as well as medication sent to pharmacy. She was instructed to not take Prilosec while taking the Prevpac. She requested phenergan suppositories. I have sent this in. Instructed to call with any problems.

## 2011-05-01 ENCOUNTER — Encounter (HOSPITAL_COMMUNITY): Payer: Self-pay | Admitting: Emergency Medicine

## 2011-05-01 ENCOUNTER — Emergency Department (HOSPITAL_COMMUNITY)
Admission: EM | Admit: 2011-05-01 | Discharge: 2011-05-01 | Disposition: A | Discharge status: Home / Self Care | Payer: Self-pay | Attending: Emergency Medicine | Admitting: Emergency Medicine

## 2011-05-01 DIAGNOSIS — R10816 Epigastric abdominal tenderness: Secondary | ICD-10-CM | POA: Insufficient documentation

## 2011-05-01 DIAGNOSIS — R1013 Epigastric pain: Secondary | ICD-10-CM | POA: Insufficient documentation

## 2011-05-01 DIAGNOSIS — R110 Nausea: Secondary | ICD-10-CM | POA: Insufficient documentation

## 2011-05-01 DIAGNOSIS — F17200 Nicotine dependence, unspecified, uncomplicated: Secondary | ICD-10-CM | POA: Insufficient documentation

## 2011-05-01 LAB — COMPREHENSIVE METABOLIC PANEL
ALT: 14 U/L (ref 0–35)
AST: 15 U/L (ref 0–37)
Albumin: 4.4 g/dL (ref 3.5–5.2)
Alkaline Phosphatase: 84 U/L (ref 39–117)
BUN: 9 mg/dL (ref 6–23)
CO2: 31 mEq/L (ref 19–32)
Calcium: 10.2 mg/dL (ref 8.4–10.5)
Chloride: 100 mEq/L (ref 96–112)
Creatinine, Ser: 0.65 mg/dL (ref 0.50–1.10)
GFR calc Af Amer: >90 mL/min (ref >90)
GFR calc non Af Amer: >90 mL/min (ref >90)
Glucose, Bld: 60 mg/dL — ABNORMAL LOW (ref 70–99)
Potassium: 3.6 mEq/L (ref 3.5–5.1)
Sodium: 138 mEq/L (ref 135–145)
Total Bilirubin: 0.3 mg/dL (ref 0.3–1.2)
Total Protein: 7.9 g/dL (ref 6.0–8.3)

## 2011-05-01 LAB — CBC
HCT: 41.3 % (ref 36.0–46.0)
Hemoglobin: 13.8 g/dL (ref 12.0–15.0)
MCH: 30 pg (ref 26.0–34.0)
MCHC: 33.4 g/dL (ref 30.0–36.0)
MCV: 89.8 fL (ref 78.0–100.0)
Platelets: 362 10*3/uL (ref 150–400)
RBC: 4.6 MIL/uL (ref 3.87–5.11)
RDW: 13.6 % (ref 11.5–15.5)
WBC: 6.8 10*3/uL (ref 4.0–10.5)

## 2011-05-01 LAB — LIPASE, BLOOD: Lipase: 39 U/L (ref 11–59)

## 2011-05-01 LAB — URINALYSIS, ROUTINE W REFLEX MICROSCOPIC
Bilirubin Urine: NEGATIVE
Glucose, UA: 100 mg/dL — AB
Hgb urine dipstick: NEGATIVE
Ketones, ur: NEGATIVE mg/dL
Nitrite: NEGATIVE
Protein, ur: NEGATIVE mg/dL
Specific Gravity, Urine: 1.02 (ref 1.005–1.030)
Urobilinogen, UA: 0.2 mg/dL (ref 0.0–1.0)
pH: 6 (ref 5.0–8.0)

## 2011-05-01 LAB — URINE MICROSCOPIC-ADD ON

## 2011-05-01 MED ORDER — ONDANSETRON HCL 4 MG/2ML IJ SOLN
4.0000 mg | Freq: Once | INTRAMUSCULAR | Status: AC
  Administered 2011-05-01: 4 mg via INTRAVENOUS
  Filled 2011-05-01: qty 2

## 2011-05-01 MED ORDER — DIPHENHYDRAMINE HCL 50 MG/ML IJ SOLN
25.0000 mg | Freq: Once | INTRAMUSCULAR | Status: AC
  Administered 2011-05-01: 25 mg via INTRAVENOUS
  Filled 2011-05-01: qty 1

## 2011-05-01 MED ORDER — HYDROMORPHONE HCL PF 1 MG/ML IJ SOLN
1.0000 mg | Freq: Once | INTRAMUSCULAR | Status: AC
  Administered 2011-05-01: 1 mg via INTRAVENOUS
  Filled 2011-05-01: qty 1

## 2011-05-01 MED ORDER — MORPHINE SULFATE 4 MG/ML IJ SOLN
4.0000 mg | Freq: Once | INTRAMUSCULAR | Status: AC
  Administered 2011-05-01: 4 mg via INTRAVENOUS
  Filled 2011-05-01: qty 1

## 2011-05-01 MED ORDER — SODIUM CHLORIDE 0.9 % IV BOLUS (SEPSIS)
1000.0000 mL | Freq: Once | INTRAVENOUS | Status: AC
Start: 2011-05-01 — End: 2011-05-01
  Administered 2011-05-01: 1000 mL via INTRAVENOUS

## 2011-05-01 MED ORDER — GI COCKTAIL ~~LOC~~
30.0000 mL | Freq: Once | ORAL | Status: AC
  Administered 2011-05-01: 30 mL via ORAL
  Filled 2011-05-01: qty 30
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
## 2011-05-01 NOTE — ED Notes (Signed)
Patient c/o mid upper abd pain with nausea, vomiting, and "a little" diarrhea that started this morning. Patient reports being released here on Wednesday after being admitted for 5 days. Per patient called by MD and told she had bacteria in intestines, unsure of what. Patient instructed to come back for any abd pain, nausea or vomiting.

## 2011-05-03 ENCOUNTER — Encounter: Payer: Self-pay | Admitting: Internal Medicine

## 2011-05-03 ENCOUNTER — Encounter: Payer: Self-pay | Admitting: Gastroenterology

## 2011-05-03 MED ORDER — BIS SUBCIT-METRONID-TETRACYC 140-125-125 MG PO CAPS: 3.0000 | ORAL_CAPSULE | Freq: Three times a day (TID) | ORAL | Status: AC

## 2011-05-03 MED ORDER — BIS SUBCIT-METRONID-TETRACYC 140-125-125 MG PO CAPS: 3.0000 | ORAL_CAPSULE | Freq: Three times a day (TID) | ORAL | Status: DC

## 2011-05-03 NOTE — Telephone Encounter (Signed)
Forms filled out for assistance with Pylera. As we are waiting, will begin therapy with free sample voucher coupons. However, these only cover 2 days of therapy each. The company has stated you may use more than one voucher. Therefore, a prescription will be printed for Pylera 3 capsules four times a day disp# 24 dated for 1/14. Then, an additional prescription will be printed for Pylera 3 capsules four times a day disp# 24 for 1/16. This will take her through Thursday. If by this point we do not have authorization for the Pylera assistance, we will proceed with one more voucher. We have been told that a decision will be made by Friday.   Pt will need to take Prilosec 20 mg po BID while taking Pylera.  Also will be notified that additional forms of birth control must be used while taking Pylera, as it may cause oral birth control to be less effective.   LMP 12/23. Negative pregnancy test 1/5.

## 2011-05-06 ENCOUNTER — Telehealth: Payer: Self-pay

## 2011-05-06 NOTE — Telephone Encounter (Signed)
I would not resume metronidazole based therapy. She really needs to take our first recommendation, specifically, Prevpac or generic equivalent. I would emphasize the importance of taking this regimen. She should make every effort to use any personal resources at her disposal to get his medication and take as prescribed.

## 2011-05-06 NOTE — Telephone Encounter (Signed)
Pt stopped per our advice due to possible side effect of peripheral neuropathy, listed on possible adverse reactions. Question if due to flagyl component. At this point, our options are extremely limited due to her lack of insurance. Prevpac would be best option in her case, but this is unaffordable, even breaking down into generic components separately.   I believe she has been researching insurance options. I am not sure if she would qualify for Medicaid, but she needs to pursue this. Due to her chronic medical issues, she needs insurance as soon as possible.   For now, continue PPI as instructed. I will route this to Dr. Jena Gauss to see if any other combination of therapies could work. Without insurance, however, we don't have any options other than self-pay at this point.

## 2011-05-06 NOTE — Telephone Encounter (Signed)
Tried to call pt LMOM to call back 

## 2011-05-06 NOTE — Telephone Encounter (Signed)
Pt is going to try to get some people to help her with the cost of the medication and she will call back on Monday and let me know what is going on.

## 2011-05-06 NOTE — Telephone Encounter (Signed)
Pt called on Thursday about the Pylera we started her on.It was causing her some numbness and tingly feeling in her legs.So I talked to Tobi Bastos about this. We advised her to stop taking the medication.

## 2011-05-14 NOTE — Telephone Encounter (Signed)
Pt called this morning to let us know she working on getting the money will let us know.

## 2011-05-17 NOTE — Telephone Encounter (Signed)
Pt called this morning wanting to know if there was something different she can take instead of Ibuprofen or tylenol of her stomach because they make it feel like she has acid sitting on her stomach. Please advied

## 2011-05-18 MED ORDER — HYDROCODONE-ACETAMINOPHEN 5-500 MG PO TABS: 1.0000 | ORAL_TABLET | ORAL | Status: DC | PRN

## 2011-05-18 NOTE — Telephone Encounter (Signed)
Called pt and told her what was said by AS and she stated that she is trying to find insurance and she is also saving some money to buy the antibx for the infection.

## 2011-05-18 NOTE — Telephone Encounter (Signed)
STOP IBUPROFEN!  

## 2011-05-18 NOTE — Telephone Encounter (Signed)
I will do a short course of Vicodin due to hx of pancreas divisum variant. HOWEVER, pt needs to attempt at all costs to obtain the medication to treat the infection she has in her stomach, and she needs to be seen at [facility] as we had planned previously. Please have her check with Medicaid. Look into [employer], as they have programs that you can purchase. This will be several hundred dollars a month, but it is absolutely necessary that she obtains insurance as she needs further work-up and treatment of current H.pylori gastritis. Finally, needs to establish care with a PCP. She can go to the [facility] for now if necessary. We will not provide any further pain medication after this. Needs to get remaining meds from PCP.

## 2011-07-12 ENCOUNTER — Other Ambulatory Visit: Payer: Self-pay

## 2011-07-12 NOTE — Telephone Encounter (Signed)
Phenergan not for long term use. Pt needs appt if she is having problems.

## 2011-07-13 ENCOUNTER — Encounter: Payer: Self-pay | Admitting: Internal Medicine

## 2011-07-13 NOTE — Telephone Encounter (Signed)
Pt is aware of OV on 4/1 at 3 with KJ and appt card was mailed

## 2011-07-14 ENCOUNTER — Emergency Department (HOSPITAL_COMMUNITY)
Admission: EM | Admit: 2011-07-14 | Discharge: 2011-07-14 | Disposition: A | Discharge status: Home / Self Care | Payer: Self-pay | Attending: Emergency Medicine | Admitting: Emergency Medicine

## 2011-07-14 ENCOUNTER — Encounter (HOSPITAL_COMMUNITY): Payer: Self-pay | Admitting: *Deleted

## 2011-07-14 DIAGNOSIS — R109 Unspecified abdominal pain: Secondary | ICD-10-CM | POA: Insufficient documentation

## 2011-07-14 DIAGNOSIS — G4730 Sleep apnea, unspecified: Secondary | ICD-10-CM | POA: Insufficient documentation

## 2011-07-14 DIAGNOSIS — F341 Dysthymic disorder: Secondary | ICD-10-CM | POA: Insufficient documentation

## 2011-07-14 DIAGNOSIS — F17200 Nicotine dependence, unspecified, uncomplicated: Secondary | ICD-10-CM | POA: Insufficient documentation

## 2011-07-14 LAB — COMPREHENSIVE METABOLIC PANEL
AST: 13 U/L (ref 0–37)
Alkaline Phosphatase: 59 U/L (ref 39–117)
BUN: 7 mg/dL (ref 6–23)
CO2: 26 mEq/L (ref 19–32)
Chloride: 104 mEq/L (ref 96–112)
Creatinine, Ser: 0.56 mg/dL (ref 0.50–1.10)
GFR calc non Af Amer: >90 mL/min (ref >90)
Potassium: 3.6 mEq/L (ref 3.5–5.1)
Total Bilirubin: 0.2 mg/dL — ABNORMAL LOW (ref 0.3–1.2)

## 2011-07-14 LAB — CBC
HCT: 34.8 % — ABNORMAL LOW (ref 36.0–46.0)
Hemoglobin: 11.9 g/dL — ABNORMAL LOW (ref 12.0–15.0)
MCH: 31 pg (ref 26.0–34.0)
MCHC: 34.2 g/dL (ref 30.0–36.0)
MCV: 90.6 fL (ref 78.0–100.0)
Platelets: 280 10*3/uL (ref 150–400)
RBC: 3.84 MIL/uL — ABNORMAL LOW (ref 3.87–5.11)
RDW: 13.8 % (ref 11.5–15.5)
WBC: 5.6 10*3/uL (ref 4.0–10.5)

## 2011-07-14 LAB — DIFFERENTIAL
Basophils Absolute: 0 10*3/uL (ref 0.0–0.1)
Lymphocytes Relative: 25 % (ref 12–46)
Monocytes Absolute: 0.8 10*3/uL (ref 0.1–1.0)
Monocytes Relative: 14 % — ABNORMAL HIGH (ref 3–12)
Neutro Abs: 3.3 10*3/uL (ref 1.7–7.7)

## 2011-07-14 MED ORDER — HYDROMORPHONE HCL PF 1 MG/ML IJ SOLN
1.0000 mg | Freq: Once | INTRAMUSCULAR | Status: AC
  Administered 2011-07-14: 1 mg via INTRAVENOUS
  Filled 2011-07-14: qty 1

## 2011-07-14 MED ORDER — PANTOPRAZOLE SODIUM 40 MG IV SOLR
40.0000 mg | Freq: Once | INTRAVENOUS | Status: AC
  Administered 2011-07-14: 40 mg via INTRAVENOUS
  Filled 2011-07-14: qty 40

## 2011-07-14 MED ORDER — DIPHENHYDRAMINE HCL 50 MG/ML IJ SOLN
25.0000 mg | Freq: Once | INTRAMUSCULAR | Status: AC
  Administered 2011-07-14: 25 mg via INTRAVENOUS
  Filled 2011-07-14: qty 1

## 2011-07-14 MED ORDER — FAMOTIDINE 20 MG PO TABS: 20.0000 mg | ORAL_TABLET | Freq: Two times a day (BID) | ORAL | Status: DC

## 2011-07-14 MED ORDER — SODIUM CHLORIDE 0.9 % IV BOLUS (SEPSIS)
1000.0000 mL | Freq: Once | INTRAVENOUS | Status: AC
  Administered 2011-07-14: 1000 mL via INTRAVENOUS

## 2011-07-14 MED ORDER — ONDANSETRON HCL 8 MG PO TABS: 8.0000 mg | ORAL_TABLET | ORAL | Status: DC | PRN

## 2011-07-14 MED ORDER — FAMOTIDINE 20 MG PO TABS
20.0000 mg | ORAL_TABLET | Freq: Once | ORAL | Status: AC
  Administered 2011-07-14: 20 mg via ORAL
  Filled 2011-07-14: qty 1

## 2011-07-14 MED ORDER — SODIUM CHLORIDE 0.9 % IV SOLN
Freq: Once | INTRAVENOUS | Status: AC
  Administered 2011-07-14: 1000 mL via INTRAVENOUS

## 2011-07-14 MED ORDER — ONDANSETRON HCL 4 MG/2ML IJ SOLN
4.0000 mg | Freq: Once | INTRAMUSCULAR | Status: AC
  Administered 2011-07-14: 4 mg via INTRAVENOUS
  Filled 2011-07-14: qty 2

## 2011-07-14 MED ORDER — BUDESONIDE-FORMOTEROL FUMARATE 160-4.5 MCG/ACT IN AERO
2.0000 | INHALATION_SPRAY | Freq: Two times a day (BID) | RESPIRATORY_TRACT | Status: DC
  Administered 2011-07-14: 2 via RESPIRATORY_TRACT
  Filled 2011-07-14: qty 6

## 2011-07-14 MED ORDER — OXYCODONE-ACETAMINOPHEN 5-325 MG PO TABS: 2.0000 | ORAL_TABLET | ORAL | Status: DC | PRN

## 2011-07-14 NOTE — ED Notes (Signed)
Patient c/o itching. EDP made aware-verbal order given.

## 2011-07-14 NOTE — Discharge Instructions (Signed)
Abdominal Pain Abdominal pain can be caused by many things. Your caregiver decides the seriousness of your pain by an examination and possibly blood tests and X-rays. Many cases can be observed and treated at home. Most abdominal pain is not caused by a disease and will probably improve without treatment. However, in many cases, more time must pass before a clear cause of the pain can be found. Before that point, it may not be known if you need more testing, or if hospitalization or surgery is needed. HOME CARE INSTRUCTIONS   Do not take laxatives unless directed by your caregiver.   Take pain medicine only as directed by your caregiver.   Only take over-the-counter or prescription medicines for pain, discomfort, or fever as directed by your caregiver.   Try a clear liquid diet (broth, tea, or water) for as long as directed by your caregiver. Slowly move to a bland diet as tolerated.  SEEK IMMEDIATE MEDICAL CARE IF:   The pain does not go away.   You have a fever.   You keep throwing up (vomiting).   The pain is felt only in portions of the abdomen. Pain in the right side could possibly be appendicitis. In an adult, pain in the left lower portion of the abdomen could be colitis or diverticulitis.   You pass bloody or black tarry stools.  MAKE SURE YOU:   Understand these instructions.   Will watch your condition.   Will get help right away if you are not doing well or get worse.  Document Released: 01/13/2005 Document Revised: 03/25/2011 Document Reviewed: 11/22/2007 [facility] Patient Information 2012 [place], [place].  Tests were normal. Clear liquids for the rest the day. Medications for pain, nausea, ulcer. Need to try to find primary care Dr. For followup.  return if worse. Lipase was normal

## 2011-07-14 NOTE — ED Notes (Signed)
Patient reports pain to be slightly better, requesting something else for the pain. EDP made aware-verbal order given. Patient denies any nausea at this time.
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
## 2011-07-14 NOTE — ED Notes (Signed)
Pt reports chronic pancreatitis.  States pain incerased last night and has not improved, pt reports taking Tylenol for pain. Also reporting mild nausea. Denies nausea.
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
## 2011-07-14 NOTE — ED Notes (Signed)
Patient being discharged-patient requesting symbicort inhaler before discharge. EDP made aware-orders given. Pharmacy called for inhaler.

## 2011-07-15 ENCOUNTER — Inpatient Hospital Stay (HOSPITAL_COMMUNITY)
Admission: EM | Admit: 2011-07-15 | Discharge: 2011-07-19 | DRG: 439 | Disposition: A | Discharge status: Home / Self Care | Payer: Self-pay | Attending: Family Medicine | Admitting: Family Medicine

## 2011-07-15 ENCOUNTER — Emergency Department (HOSPITAL_COMMUNITY): Payer: Self-pay

## 2011-07-15 ENCOUNTER — Encounter (HOSPITAL_COMMUNITY): Payer: Self-pay | Admitting: *Deleted

## 2011-07-15 DIAGNOSIS — F3289 Other specified depressive episodes: Secondary | ICD-10-CM | POA: Diagnosis present

## 2011-07-15 DIAGNOSIS — Q453 Other congenital malformations of pancreas and pancreatic duct: Secondary | ICD-10-CM

## 2011-07-15 DIAGNOSIS — K859 Acute pancreatitis without necrosis or infection, unspecified: Principal | ICD-10-CM | POA: Diagnosis present

## 2011-07-15 DIAGNOSIS — F329 Major depressive disorder, single episode, unspecified: Secondary | ICD-10-CM | POA: Diagnosis present

## 2011-07-15 DIAGNOSIS — F1210 Cannabis abuse, uncomplicated: Secondary | ICD-10-CM | POA: Diagnosis present

## 2011-07-15 DIAGNOSIS — F17200 Nicotine dependence, unspecified, uncomplicated: Secondary | ICD-10-CM | POA: Diagnosis present

## 2011-07-15 DIAGNOSIS — F411 Generalized anxiety disorder: Secondary | ICD-10-CM | POA: Diagnosis present

## 2011-07-15 LAB — DIFFERENTIAL
Basophils Absolute: 0 10*3/uL (ref 0.0–0.1)
Lymphocytes Relative: 12 % (ref 12–46)
Lymphs Abs: 1.5 10*3/uL (ref 0.7–4.0)
Monocytes Absolute: 1.1 10*3/uL — ABNORMAL HIGH (ref 0.1–1.0)
Monocytes Relative: 9 % (ref 3–12)
Neutro Abs: 10 10*3/uL — ABNORMAL HIGH (ref 1.7–7.7)

## 2011-07-15 LAB — POCT PREGNANCY, URINE: Preg Test, Ur: NEGATIVE

## 2011-07-15 LAB — URINALYSIS, ROUTINE W REFLEX MICROSCOPIC
Bilirubin Urine: NEGATIVE
Glucose, UA: NEGATIVE mg/dL
Hgb urine dipstick: NEGATIVE
Specific Gravity, Urine: 1.02 (ref 1.005–1.030)

## 2011-07-15 LAB — LIPASE, BLOOD: Lipase: 785 U/L — ABNORMAL HIGH (ref 11–59)

## 2011-07-15 LAB — CBC
HCT: 38.8 % (ref 36.0–46.0)
Hemoglobin: 13 g/dL (ref 12.0–15.0)
RBC: 4.24 MIL/uL (ref 3.87–5.11)
RDW: 13.9 % (ref 11.5–15.5)
WBC: 12.8 10*3/uL — ABNORMAL HIGH (ref 4.0–10.5)

## 2011-07-15 LAB — POCT I-STAT, CHEM 8
BUN: 5 mg/dL — ABNORMAL LOW (ref 6–23)
Chloride: 105 mEq/L (ref 96–112)
HCT: 40 % (ref 36.0–46.0)
Potassium: 3.6 mEq/L (ref 3.5–5.1)
Sodium: 140 mEq/L (ref 135–145)

## 2011-07-15 LAB — COMPREHENSIVE METABOLIC PANEL
AST: 12 U/L (ref 0–37)
CO2: 27 mEq/L (ref 19–32)
Chloride: 100 mEq/L (ref 96–112)
Creatinine, Ser: 0.57 mg/dL (ref 0.50–1.10)
GFR calc non Af Amer: >90 mL/min (ref >90)
Glucose, Bld: 99 mg/dL (ref 70–99)
Total Bilirubin: 0.3 mg/dL (ref 0.3–1.2)

## 2011-07-15 MED ORDER — ONDANSETRON HCL 4 MG/2ML IJ SOLN
4.0000 mg | Freq: Once | INTRAMUSCULAR | Status: AC
  Administered 2011-07-15: 4 mg via INTRAVENOUS
  Filled 2011-07-15: qty 2

## 2011-07-15 MED ORDER — PANTOPRAZOLE SODIUM 40 MG PO TBEC
40.0000 mg | DELAYED_RELEASE_TABLET | Freq: Every day | ORAL | Status: DC
  Administered 2011-07-15 – 2011-07-19 (×6): 40 mg via ORAL
  Filled 2011-07-15 (×5): qty 1

## 2011-07-15 MED ORDER — HYDROMORPHONE HCL PF 1 MG/ML IJ SOLN
1.0000 mg | Freq: Once | INTRAMUSCULAR | Status: AC
  Administered 2011-07-15: 1 mg via INTRAVENOUS
  Filled 2011-07-15: qty 1

## 2011-07-15 MED ORDER — DIPHENHYDRAMINE HCL 50 MG/ML IJ SOLN
INTRAMUSCULAR | Status: AC
  Administered 2011-07-15: 25 mg via INTRAVENOUS
  Filled 2011-07-15: qty 1

## 2011-07-15 MED ORDER — OXYCODONE-ACETAMINOPHEN 5-325 MG PO TABS
2.0000 | ORAL_TABLET | Freq: Once | ORAL | Status: DC
  Filled 2011-07-15: qty 2

## 2011-07-15 MED ORDER — HYDROMORPHONE HCL PF 1 MG/ML IJ SOLN
1.0000 mg | INTRAMUSCULAR | Status: DC | PRN
  Administered 2011-07-15 (×2): 1 mg via INTRAVENOUS
  Filled 2011-07-15 (×3): qty 1

## 2011-07-15 MED ORDER — HYDROMORPHONE HCL PF 1 MG/ML IJ SOLN
1.0000 mg | Freq: Once | INTRAMUSCULAR | Status: AC
  Administered 2011-07-15: 1 mg via INTRAVENOUS

## 2011-07-15 MED ORDER — ONDANSETRON HCL 4 MG/2ML IJ SOLN
4.0000 mg | Freq: Four times a day (QID) | INTRAMUSCULAR | Status: DC | PRN
  Administered 2011-07-15 – 2011-07-19 (×7): 4 mg via INTRAVENOUS
  Filled 2011-07-15 (×7): qty 2

## 2011-07-15 MED ORDER — SODIUM CHLORIDE 0.9 % IV BOLUS (SEPSIS)
1000.0000 mL | Freq: Once | INTRAVENOUS | Status: AC
  Administered 2011-07-15: 1000 mL via INTRAVENOUS

## 2011-07-15 MED ORDER — SODIUM CHLORIDE 0.9 % IV SOLN
INTRAVENOUS | Status: DC
  Administered 2011-07-16 – 2011-07-18 (×4): via INTRAVENOUS

## 2011-07-15 MED ORDER — IOHEXOL 300 MG/ML  SOLN
100.0000 mL | Freq: Once | INTRAMUSCULAR | Status: AC | PRN
  Administered 2011-07-15: 100 mL via INTRAVENOUS

## 2011-07-15 MED ORDER — HYDROMORPHONE HCL PF 1 MG/ML IJ SOLN
2.0000 mg | INTRAMUSCULAR | Status: DC | PRN
  Administered 2011-07-15 – 2011-07-18 (×24): 2 mg via INTRAVENOUS
  Filled 2011-07-15: qty 2
  Filled 2011-07-15 (×2): qty 1
  Filled 2011-07-15 (×24): qty 2

## 2011-07-15 MED ORDER — HYDROXYZINE HCL 25 MG PO TABS
25.0000 mg | ORAL_TABLET | Freq: Every evening | ORAL | Status: DC | PRN
  Administered 2011-07-15 – 2011-07-18 (×4): 25 mg via ORAL
  Filled 2011-07-15 (×4): qty 1

## 2011-07-15 MED ORDER — HYDROMORPHONE HCL PF 1 MG/ML IJ SOLN
1.0000 mg | INTRAMUSCULAR | Status: DC | PRN
  Filled 2011-07-15: qty 1

## 2011-07-15 MED ORDER — ONDANSETRON 8 MG PO TBDP
8.0000 mg | ORAL_TABLET | Freq: Once | ORAL | Status: AC
  Administered 2011-07-15: 8 mg via ORAL
  Filled 2011-07-15: qty 1

## 2011-07-15 MED ORDER — HYDROMORPHONE HCL PF 1 MG/ML IJ SOLN
1.0000 mg | INTRAMUSCULAR | Status: DC | PRN
  Administered 2011-07-15 (×3): 1 mg via INTRAVENOUS
  Filled 2011-07-15 (×2): qty 1

## 2011-07-15 NOTE — H&P (Signed)
[id number] 
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
## 2011-07-15 NOTE — Progress Notes (Addendum)
Called Dr. Janna Arch to clarify pain medication order. Order given for dilaudid 1mg  every 2hrs. Patient informed. Patient request to call MD and get order changed to more frequent and asked for Dr. Janna Arch to come speak with her again. Will notify MD.  1610: Dr. Janna Arch responded to my page regarding above issue. Order given to increase pain medication to q1h. Will continue to monitor patient.
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
## 2011-07-15 NOTE — ED Notes (Signed)
Aware awaiting ct scan. Nad.

## 2011-07-16 DIAGNOSIS — K859 Acute pancreatitis without necrosis or infection, unspecified: Secondary | ICD-10-CM

## 2011-07-16 LAB — HEPATIC FUNCTION PANEL
Albumin: 3.7 g/dL (ref 3.5–5.2)
Total Bilirubin: 0.2 mg/dL — ABNORMAL LOW (ref 0.3–1.2)

## 2011-07-16 LAB — LIPASE, BLOOD: Lipase: 425 U/L — ABNORMAL HIGH (ref 11–59)

## 2011-07-16 MED ORDER — SODIUM CHLORIDE 0.9 % IJ SOLN
INTRAMUSCULAR | Status: AC
  Administered 2011-07-16: 10 mL
  Filled 2011-07-16: qty 3

## 2011-07-16 MED ORDER — DIPHENHYDRAMINE HCL 50 MG/ML IJ SOLN
25.0000 mg | Freq: Once | INTRAMUSCULAR | Status: AC
Start: 2011-07-16 — End: 2011-07-16
  Administered 2011-07-16: 25 mg via INTRAVENOUS
  Filled 2011-07-16: qty 1

## 2011-07-16 MED ORDER — ONDANSETRON HCL 4 MG/2ML IJ SOLN
4.0000 mg | Freq: Three times a day (TID) | INTRAMUSCULAR | Status: DC
  Administered 2011-07-16 – 2011-07-19 (×9): 4 mg via INTRAVENOUS
  Filled 2011-07-16 (×9): qty 2

## 2011-07-16 MED ORDER — DIPHENHYDRAMINE HCL 25 MG PO CAPS
25.0000 mg | ORAL_CAPSULE | Freq: Four times a day (QID) | ORAL | Status: DC | PRN
  Administered 2011-07-16 – 2011-07-17 (×4): 25 mg via ORAL
  Filled 2011-07-16 (×4): qty 1
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
## 2011-07-16 NOTE — Plan of Care (Signed)
Problem: Phase III Progression Outcomes Goal: Voiding independently Outcome: Progressing Pt ambulates to the bathroom independently.

## 2011-07-16 NOTE — Progress Notes (Signed)
   CARE MANAGEMENT NOTE 07/16/2011  Patient:  [name], [name]   Account Number:  [id number]  Date Initiated:  07/16/2011  Documentation initiated by:  Sharrie Rothman  Subjective/Objective Assessment:   Pt admitted from home with pancreatitis. Pt lives with husband.     Action/Plan:   CM to monitor for any medication assistance at discharge. Financial counselor notified of pt self pay status.   Anticipated DC Date:  07/23/2011   Anticipated DC Plan:  HOME/SELF CARE  In-house referral  Financial Counselor      DC Planning Services  CM consult      Choice offered to / List presented to:             Status of service:  In process, will continue to follow Medicare Important Message given?   (If response is "NO", the following Medicare IM given date fields will be blank) Date Medicare IM given:   Date Additional Medicare IM given:    Discharge Disposition:  HOME/SELF CARE  Per UR Regulation:    If discussed at Long Length of Stay Meetings, dates discussed:    Comments:  07/16/11 1417 [name], RN BSN CM Pt admitted from home. Pt lives with husband. Pt is admitted frequently for pancreatitis. Pt does have an appt at [facility] in April for a specialist in Gi services. May need to help pt with meds at discharge. Pt does not have a PCP but follows up with Dr. Darrick Penna here in [place] when her pancreatitis flares up.

## 2011-07-16 NOTE — Progress Notes (Signed)
UR Chart Review Completed  
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
## 2011-07-16 NOTE — Progress Notes (Signed)
07/16/11 1230 Patient c/o itching, believes due to pain medication. No rash or irritation noted, notified Dr Janna Arch, orders received for benadryl PRN. Orders placed, will monitor.

## 2011-07-16 NOTE — Progress Notes (Signed)
07/16/11 1056 Patient requested medical leave paperwork be filled out per MD, notified Dr Janna Arch. Stated not in office today, so would address in the morning on rounds. Paperwork placed on front of chart, discussed with patient.

## 2011-07-16 NOTE — Progress Notes (Signed)
[id number] 

## 2011-07-17 LAB — HEPATIC FUNCTION PANEL
ALT: 10 U/L (ref 0–35)
Indirect Bilirubin: 0.1 mg/dL — ABNORMAL LOW (ref 0.3–0.9)
Total Protein: 6.1 g/dL (ref 6.0–8.3)

## 2011-07-17 LAB — LIPASE, BLOOD: Lipase: 107 U/L — ABNORMAL HIGH (ref 11–59)

## 2011-07-17 MED ORDER — SODIUM CHLORIDE 0.9 % IJ SOLN
INTRAMUSCULAR | Status: AC
  Administered 2011-07-17: 10 mL
  Filled 2011-07-17: qty 3

## 2011-07-17 NOTE — Progress Notes (Signed)
[id number] 

## 2011-07-17 NOTE — Progress Notes (Signed)
NAMEMarland Kitchen  JANNY, CRUTE NO.:  [id number]  MEDICAL RECORD NO.:  [id number]  LOCATION:  A307                          FACILITY:  [facility]  PHYSICIAN:  Melvyn Novas, MDDATE OF BIRTH:  Nov 06, 1985  DATE OF PROCEDURE:  07/17/2011 DATE OF DISCHARGE:                                PROGRESS NOTE   SUBJECTIVE:  This is a 26 year old married white female with chronic recurrent pancreatitis, not due to ethanol, due to pancreas divisum, admitted with lipase of 750s, yesterday down to 480, today down to 107. She does have nausea.  She has significant epigastric discomfort.  No melena, hematemesis, or hematochezia.  She is on clear liquids and tolerating these.  OBJECTIVE:  LUNGS:  Clear. HEART:  Unremarkable, some epigastric tenderness to palpation. ABDOMEN:  Voluntary guarding.  No rebound.  ASSESSMENT AND PLAN:  Plan right now is to continue clear liquids, bowel rest, continue Protonix 40 IV, and we will make further recommendations.     Melvyn Novas, MD     RMD/MEDQ  D:  07/17/2011  T:  07/17/2011  Job:  [id number]

## 2011-07-17 NOTE — Progress Notes (Signed)
07/17/11 1426 Patient c/o persistent abdominal discomfort, rates an 8 or 9/10, decreased with dilaudid as needed sometimes. Other times states pain about the same. Receiving dilaudid 2 mg q2h PRN for pain as ordered, notified Dr Renard Matter of persistent pain despite pain medication as ordered.  Discussed possibility of starting po pain medication to last longer, stated no changes to pain medication regimen at this time. Nursing to monitor.

## 2011-07-18 LAB — HEPATIC FUNCTION PANEL
AST: 15 U/L (ref 0–37)
Albumin: 4.1 g/dL (ref 3.5–5.2)
Total Bilirubin: 0.2 mg/dL — ABNORMAL LOW (ref 0.3–1.2)

## 2011-07-18 MED ORDER — HYDROMORPHONE HCL PF 1 MG/ML IJ SOLN
1.0000 mg | INTRAMUSCULAR | Status: DC | PRN
  Administered 2011-07-18 – 2011-07-19 (×10): 1 mg via INTRAVENOUS
  Filled 2011-07-18 (×10): qty 1

## 2011-07-18 MED ORDER — SODIUM CHLORIDE 0.9 % IJ SOLN
INTRAMUSCULAR | Status: AC
  Administered 2011-07-18: 10 mL
  Filled 2011-07-18: qty 3

## 2011-07-18 NOTE — Progress Notes (Signed)
07/18/11 1505 Patient c/o having trouble eating broth with clear liquid trays, states believes that may be contributing to her nausea. Stated she discussed with Dr Janna Arch on rounds this afternoon and he would advance diet. No orders placed, discussed patient concerns with Dr Renard Matter, stated okay to advance to low fat diet. Nursing to monitor.

## 2011-07-18 NOTE — Progress Notes (Signed)
[id number] 

## 2011-07-19 ENCOUNTER — Ambulatory Visit: Payer: Self-pay | Admitting: Urgent Care

## 2011-07-19 ENCOUNTER — Encounter: Payer: Self-pay | Admitting: Internal Medicine

## 2011-07-19 MED ORDER — OXYCODONE HCL 5 MG PO TABS: 5.0000 mg | ORAL_TABLET | Freq: Four times a day (QID) | ORAL | Status: DC | PRN

## 2011-07-19 NOTE — Discharge Summary (Signed)
[id number] 

## 2011-07-19 NOTE — Progress Notes (Signed)
NAMEMarland Dunn  ENAS, WINCHEL NO.:  [id number]  MEDICAL RECORD NO.:  [id number]  LOCATION:  A307                          FACILITY:  [facility]  PHYSICIAN:  Melvyn Novas, MDDATE OF BIRTH:  1985/09/11  DATE OF PROCEDURE: DATE OF DISCHARGE:                                PROGRESS NOTE   The patient has chronic recurrent pancreatitis.  Lipase down to 87 today, down from 700s.  Sleeps better.  She still has epigastric pain, worsened by her chicken broth, still on clear liquids.  No hematemesis, melena, hematochezia.  LUNGS:  Clear. Heart:  Unremarkable. ABDOMEN:  Voluntary guarding of epigastrium. VITAL SIGNS:  Blood pressure 112/72, temperature 98.1.  PLAN:  Right now is to decrease Dilaudid to 1 mg q.2 h to see how her pain threshold is in hopes of discharging her in the next day or 2.  We will see how her analgesia goes.     Melvyn Novas, MD     RMD/MEDQ  D:  07/18/2011  T:  07/19/2011  Job:  [id number]
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
## 2011-07-19 NOTE — Progress Notes (Signed)
   CARE MANAGEMENT NOTE 07/19/2011  Patient:  [name], [name]   Account Number:  [id number]  Date Initiated:  07/16/2011  Documentation initiated by:  Sharrie Rothman  Subjective/Objective Assessment:   Pt admitted from home with pancreatitis. Pt lives with husband.     Action/Plan:   CM to monitor for any medication assistance at discharge. Financial counselor notified of pt self pay status.   Anticipated DC Date:  07/23/2011   Anticipated DC Plan:  HOME/SELF CARE  In-house referral  Financial Counselor      DC Planning Services  CM consult      Choice offered to / List presented to:             Status of service:  Completed, signed off Medicare Important Message given?   (If response is "NO", the following Medicare IM given date fields will be blank) Date Medicare IM given:   Date Additional Medicare IM given:    Discharge Disposition:  HOME/SELF CARE  Per UR Regulation:    If discussed at Long Length of Stay Meetings, dates discussed:    Comments:  07/19/11 1431 [name], RN BSN CM Pt discharged today. Pt needed help with medication. CM gave voucher to pt for [facility] for oxycodone 5 mg #30 $17.06. Explained process to pt and pt nurse. No other CM needs at this time.   07/16/11 1417 [name], RN BSN CM Pt admitted from home. Pt lives with husband. Pt is admitted frequently for pancreatitis. Pt does have an appt at [facility] in April for a specialist in Gi services. May need to help pt with meds at discharge. Pt does not have a PCP but follows up with Dr. Darrick Penna here in [place] when her pancreatitis flares up.

## 2011-07-19 NOTE — Progress Notes (Signed)
Patient discharged home via family; Pt given and explained discharge instructions, prescriptions, carenotes; stated understanding and denied questions; pts IV removed without problems; pt stable at time of discharge   

## 2011-08-27 ENCOUNTER — Telehealth: Payer: Self-pay | Admitting: Internal Medicine

## 2011-08-27 ENCOUNTER — Ambulatory Visit: Payer: Self-pay | Admitting: Internal Medicine

## 2011-08-27 NOTE — Telephone Encounter (Signed)
PATIENT WAS A NO SHOW

## 2011-09-11 ENCOUNTER — Emergency Department (HOSPITAL_COMMUNITY): Payer: Self-pay

## 2011-09-11 ENCOUNTER — Encounter (HOSPITAL_COMMUNITY): Payer: Self-pay | Admitting: *Deleted

## 2011-09-11 ENCOUNTER — Emergency Department (HOSPITAL_COMMUNITY)
Admission: EM | Admit: 2011-09-11 | Discharge: 2011-09-11 | Disposition: A | Discharge status: Home / Self Care | Payer: Self-pay | Attending: Emergency Medicine | Admitting: Emergency Medicine

## 2011-09-11 DIAGNOSIS — F17200 Nicotine dependence, unspecified, uncomplicated: Secondary | ICD-10-CM | POA: Insufficient documentation

## 2011-09-11 DIAGNOSIS — J189 Pneumonia, unspecified organism: Secondary | ICD-10-CM | POA: Insufficient documentation

## 2011-09-11 DIAGNOSIS — K8689 Other specified diseases of pancreas: Secondary | ICD-10-CM | POA: Insufficient documentation

## 2011-09-11 DIAGNOSIS — E876 Hypokalemia: Secondary | ICD-10-CM | POA: Insufficient documentation

## 2011-09-11 DIAGNOSIS — M791 Myalgia, unspecified site: Secondary | ICD-10-CM

## 2011-09-11 DIAGNOSIS — E860 Dehydration: Secondary | ICD-10-CM

## 2011-09-11 DIAGNOSIS — Z79899 Other long term (current) drug therapy: Secondary | ICD-10-CM | POA: Insufficient documentation

## 2011-09-11 DIAGNOSIS — M75120 Complete rotator cuff tear or rupture of unspecified shoulder, not specified as traumatic: Secondary | ICD-10-CM | POA: Insufficient documentation

## 2011-09-11 DIAGNOSIS — E871 Hypo-osmolality and hyponatremia: Secondary | ICD-10-CM

## 2011-09-11 DIAGNOSIS — R000 Tachycardia, unspecified: Secondary | ICD-10-CM | POA: Insufficient documentation

## 2011-09-11 DIAGNOSIS — R209 Unspecified disturbances of skin sensation: Secondary | ICD-10-CM | POA: Insufficient documentation

## 2011-09-11 LAB — CBC
HCT: 38.4 % (ref 36.0–46.0)
MCHC: 34.1 g/dL (ref 30.0–36.0)
MCV: 89.5 fL (ref 78.0–100.0)
Platelets: 332 10*3/uL (ref 150–400)
RDW: 13.3 % (ref 11.5–15.5)
WBC: 24.5 10*3/uL — ABNORMAL HIGH (ref 4.0–10.5)

## 2011-09-11 LAB — URINALYSIS, ROUTINE W REFLEX MICROSCOPIC
Nitrite: NEGATIVE
Protein, ur: 30 mg/dL — AB
Urobilinogen, UA: 0.2 mg/dL (ref 0.0–1.0)

## 2011-09-11 LAB — LIPASE, BLOOD: Lipase: 14 U/L (ref 11–59)

## 2011-09-11 LAB — DIFFERENTIAL
Eosinophils Absolute: 0.1 10*3/uL (ref 0.0–0.7)
Lymphocytes Relative: 5 % — ABNORMAL LOW (ref 12–46)
Monocytes Relative: 10 % (ref 3–12)
Neutro Abs: 20.9 10*3/uL — ABNORMAL HIGH (ref 1.7–7.7)

## 2011-09-11 LAB — COMPREHENSIVE METABOLIC PANEL
Alkaline Phosphatase: 167 U/L — ABNORMAL HIGH (ref 39–117)
BUN: 10 mg/dL (ref 6–23)
Chloride: 93 mEq/L — ABNORMAL LOW (ref 96–112)
GFR calc Af Amer: >90 mL/min (ref >90)
GFR calc non Af Amer: >90 mL/min (ref >90)
Glucose, Bld: 145 mg/dL — ABNORMAL HIGH (ref 70–99)
Potassium: 3 mEq/L — ABNORMAL LOW (ref 3.5–5.1)
Total Bilirubin: 0.5 mg/dL (ref 0.3–1.2)
Total Protein: 8.6 g/dL — ABNORMAL HIGH (ref 6.0–8.3)

## 2011-09-11 LAB — URINE MICROSCOPIC-ADD ON

## 2011-09-11 MED ORDER — FENTANYL CITRATE 0.05 MG/ML IJ SOLN
50.0000 ug | Freq: Once | INTRAMUSCULAR | Status: AC
  Administered 2011-09-11: 50 ug via INTRAVENOUS
  Filled 2011-09-11: qty 2

## 2011-09-11 MED ORDER — BUDESONIDE-FORMOTEROL FUMARATE 160-4.5 MCG/ACT IN AERO: 2.0000 | INHALATION_SPRAY | Freq: Two times a day (BID) | RESPIRATORY_TRACT | Status: DC

## 2011-09-11 MED ORDER — ALBUTEROL SULFATE HFA 108 (90 BASE) MCG/ACT IN AERS: 2.0000 | INHALATION_SPRAY | Freq: Four times a day (QID) | RESPIRATORY_TRACT | Status: DC | PRN

## 2011-09-11 MED ORDER — SODIUM CHLORIDE 0.9 % IV SOLN
INTRAVENOUS | Status: DC
  Administered 2011-09-11: 125 mL via INTRAVENOUS
  Administered 2011-09-11: 15:00:00 via INTRAVENOUS

## 2011-09-11 MED ORDER — PIPERACILLIN-TAZOBACTAM 3.375 G IVPB
3.3750 g | Freq: Once | INTRAVENOUS | Status: DC
  Filled 2011-09-11: qty 50

## 2011-09-11 MED ORDER — TRAMADOL HCL 50 MG PO TABS: 100.0000 mg | ORAL_TABLET | Freq: Four times a day (QID) | ORAL | Status: AC | PRN

## 2011-09-11 MED ORDER — KETOROLAC TROMETHAMINE 30 MG/ML IJ SOLN
30.0000 mg | Freq: Once | INTRAMUSCULAR | Status: AC
  Administered 2011-09-11: 30 mg via INTRAVENOUS
  Filled 2011-09-11: qty 1

## 2011-09-11 MED ORDER — MOXIFLOXACIN HCL 400 MG PO TABS: 400.0000 mg | ORAL_TABLET | Freq: Every day | ORAL | Status: AC

## 2011-09-11 MED ORDER — SODIUM CHLORIDE 0.9 % IV BOLUS (SEPSIS)
1000.0000 mL | Freq: Once | INTRAVENOUS | Status: AC
  Administered 2011-09-11: 1000 mL via INTRAVENOUS

## 2011-09-11 MED ORDER — HYDROXYZINE HCL 25 MG PO TABS: 25.0000 mg | ORAL_TABLET | Freq: Four times a day (QID) | ORAL | Status: AC

## 2011-09-11 MED ORDER — METHOCARBAMOL 500 MG PO TABS
1000.0000 mg | ORAL_TABLET | Freq: Once | ORAL | Status: AC
  Administered 2011-09-11: 1000 mg via ORAL
  Filled 2011-09-11: qty 2

## 2011-09-11 MED ORDER — CYCLOBENZAPRINE HCL 10 MG PO TABS: 10.0000 mg | ORAL_TABLET | Freq: Three times a day (TID) | ORAL | Status: AC | PRN

## 2011-09-11 MED ORDER — VANCOMYCIN HCL IN DEXTROSE 1-5 GM/200ML-% IV SOLN
1000.0000 mg | Freq: Once | INTRAVENOUS | Status: AC
  Administered 2011-09-11: 1000 mg via INTRAVENOUS
  Filled 2011-09-11: qty 200

## 2011-09-11 MED ORDER — DM-GUAIFENESIN ER 30-600 MG PO TB12
1.0000 | ORAL_TABLET | Freq: Two times a day (BID) | ORAL | Status: DC
  Administered 2011-09-11: 1 via ORAL
  Filled 2011-09-11: qty 1

## 2011-09-11 MED ORDER — PROMETHAZINE HCL 25 MG PO TABS
25.0000 mg | ORAL_TABLET | Freq: Three times a day (TID) | ORAL | Status: DC | PRN
Start: 2011-09-11 — End: 2011-09-18

## 2011-09-11 MED ORDER — POTASSIUM CHLORIDE ER 10 MEQ PO TBCR: 20.0000 meq | EXTENDED_RELEASE_TABLET | Freq: Two times a day (BID) | ORAL | Status: DC

## 2011-09-11 NOTE — ED Notes (Signed)
Pt c/o increased pain. Dr IVA KNAPP notified. No orders received.

## 2011-09-11 NOTE — ED Notes (Signed)
Pt c/o cough and congestion x 3 days. Pt states that she is coughing up yellow phlegm. Pt c/o chest and abdominal pain with coughing and body aches.
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
## 2011-09-11 NOTE — Discharge Instructions (Signed)
You have pneumonia on your x-ray and you should consider staying to be admitted to the hospital. Take the antibiotics until gone, take the potassium until gone for your low potassium. A low potassium can make you feel weak and have muscle cramping. Use the inhaler as needed for wheezing or shortness of breath. Drink plenty of fluids, use the Phenergan as needed for nausea. Recheck if you feel like you're getting worse he needs to be admitted.

## 2011-09-18 ENCOUNTER — Encounter (HOSPITAL_COMMUNITY): Payer: Self-pay | Admitting: Emergency Medicine

## 2011-09-18 ENCOUNTER — Emergency Department (HOSPITAL_COMMUNITY): Payer: Self-pay

## 2011-09-18 ENCOUNTER — Emergency Department (HOSPITAL_COMMUNITY)
Admission: EM | Admit: 2011-09-18 | Discharge: 2011-09-18 | Disposition: A | Discharge status: Home / Self Care | Payer: Self-pay | Attending: Emergency Medicine | Admitting: Emergency Medicine

## 2011-09-18 DIAGNOSIS — K859 Acute pancreatitis without necrosis or infection, unspecified: Secondary | ICD-10-CM

## 2011-09-18 DIAGNOSIS — F341 Dysthymic disorder: Secondary | ICD-10-CM | POA: Insufficient documentation

## 2011-09-18 DIAGNOSIS — M25562 Pain in left knee: Secondary | ICD-10-CM

## 2011-09-18 DIAGNOSIS — F17200 Nicotine dependence, unspecified, uncomplicated: Secondary | ICD-10-CM | POA: Insufficient documentation

## 2011-09-18 DIAGNOSIS — K861 Other chronic pancreatitis: Secondary | ICD-10-CM | POA: Insufficient documentation

## 2011-09-18 DIAGNOSIS — G4730 Sleep apnea, unspecified: Secondary | ICD-10-CM | POA: Insufficient documentation

## 2011-09-18 DIAGNOSIS — Z79899 Other long term (current) drug therapy: Secondary | ICD-10-CM | POA: Insufficient documentation

## 2011-09-18 DIAGNOSIS — M25569 Pain in unspecified knee: Secondary | ICD-10-CM | POA: Insufficient documentation

## 2011-09-18 DIAGNOSIS — M549 Dorsalgia, unspecified: Secondary | ICD-10-CM | POA: Insufficient documentation

## 2011-09-18 DIAGNOSIS — M79609 Pain in unspecified limb: Secondary | ICD-10-CM | POA: Insufficient documentation

## 2011-09-18 DIAGNOSIS — R51 Headache: Secondary | ICD-10-CM | POA: Insufficient documentation

## 2011-09-18 DIAGNOSIS — N390 Urinary tract infection, site not specified: Secondary | ICD-10-CM | POA: Insufficient documentation

## 2011-09-18 LAB — COMPREHENSIVE METABOLIC PANEL
ALT: 17 U/L (ref 0–35)
Alkaline Phosphatase: 90 U/L (ref 39–117)
BUN: 16 mg/dL (ref 6–23)
CO2: 30 mEq/L (ref 19–32)
GFR calc Af Amer: >90 mL/min (ref >90)
GFR calc non Af Amer: >90 mL/min (ref >90)
Glucose, Bld: 121 mg/dL — ABNORMAL HIGH (ref 70–99)
Potassium: 3.3 mEq/L — ABNORMAL LOW (ref 3.5–5.1)
Sodium: 137 mEq/L (ref 135–145)
Total Bilirubin: 0.2 mg/dL — ABNORMAL LOW (ref 0.3–1.2)

## 2011-09-18 LAB — CBC
Hemoglobin: 13.6 g/dL (ref 12.0–15.0)
MCH: 30 pg (ref 26.0–34.0)
MCV: 89 fL (ref 78.0–100.0)
Platelets: 489 10*3/uL — ABNORMAL HIGH (ref 150–400)
RBC: 4.53 MIL/uL (ref 3.87–5.11)
WBC: 13.8 10*3/uL — ABNORMAL HIGH (ref 4.0–10.5)

## 2011-09-18 LAB — DIFFERENTIAL
Eosinophils Relative: 1 % (ref 0–5)
Lymphocytes Relative: 20 % (ref 12–46)
Lymphs Abs: 2.8 10*3/uL (ref 0.7–4.0)
Monocytes Relative: 10 % (ref 3–12)
Neutrophils Relative %: 68 % (ref 43–77)

## 2011-09-18 LAB — URINALYSIS, ROUTINE W REFLEX MICROSCOPIC
Leukocytes, UA: NEGATIVE
Nitrite: NEGATIVE
Specific Gravity, Urine: 1.02 (ref 1.005–1.030)
Urobilinogen, UA: 0.2 mg/dL (ref 0.0–1.0)
pH: 6 (ref 5.0–8.0)

## 2011-09-18 LAB — LIPASE, BLOOD: Lipase: 32 U/L (ref 11–59)

## 2011-09-18 MED ORDER — ONDANSETRON HCL 4 MG/2ML IJ SOLN
4.0000 mg | Freq: Once | INTRAMUSCULAR | Status: AC
  Administered 2011-09-18: 4 mg via INTRAVENOUS
  Filled 2011-09-18: qty 2

## 2011-09-18 MED ORDER — SODIUM CHLORIDE 0.9 % IV SOLN: INTRAVENOUS | Status: DC

## 2011-09-18 MED ORDER — OXYCODONE-ACETAMINOPHEN 5-325 MG PO TABS: 1.0000 | ORAL_TABLET | Freq: Four times a day (QID) | ORAL | Status: DC | PRN

## 2011-09-18 MED ORDER — CEPHALEXIN 500 MG PO CAPS: 500.0000 mg | ORAL_CAPSULE | Freq: Four times a day (QID) | ORAL | Status: AC

## 2011-09-18 MED ORDER — HYDROMORPHONE HCL PF 1 MG/ML IJ SOLN
1.0000 mg | Freq: Once | INTRAMUSCULAR | Status: AC
  Administered 2011-09-18: 1 mg via INTRAVENOUS
  Filled 2011-09-18: qty 1

## 2011-09-18 MED ORDER — SODIUM CHLORIDE 0.9 % IV BOLUS (SEPSIS)
1000.0000 mL | Freq: Once | INTRAVENOUS | Status: AC
  Administered 2011-09-18: 1000 mL via INTRAVENOUS

## 2011-09-18 MED ORDER — PROMETHAZINE HCL 25 MG PO TABS: 25.0000 mg | ORAL_TABLET | Freq: Four times a day (QID) | ORAL | Status: DC | PRN

## 2011-09-18 MED ORDER — DIPHENHYDRAMINE HCL 50 MG/ML IJ SOLN
25.0000 mg | Freq: Once | INTRAMUSCULAR | Status: AC
  Administered 2011-09-18: 25 mg via INTRAVENOUS

## 2011-09-18 MED ORDER — DIPHENHYDRAMINE HCL 50 MG/ML IJ SOLN
INTRAMUSCULAR | Status: AC
  Filled 2011-09-18: qty 1

## 2011-09-18 NOTE — ED Notes (Signed)
Pt alert & oriented x4, stable gait. Pt given discharge instructions, paperwork & prescription(s). Patient instructed to stop at the registration desk to finish any additional paperwork. pt verbalized understanding. Pt left department w/ no further questions.  
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
## 2011-09-18 NOTE — ED Notes (Signed)
Pt states chronic abdominal pain d/t pancreatitis. Left knee started hurting last night & pt felt a pop today. Pt states she has metal rods in her leg. Denies recent injury.

## 2011-09-18 NOTE — Discharge Instructions (Signed)
Followup with [name] for the pancreatitis as scheduled. Take pain medicine and antinausea medicine as directed. Return for inability to keep fluids down persistent vomiting or worse pain. Recommend clear liquid diet for the next 24 hours. Knee immobilizer for the left knee pain referral given to followup with orthopedics about the knee.  RESOURCE GUIDE  Chronic Pain Problems: Contact [facility]  [phone] Patients need to be referred by their primary care doctor.  Insufficient Money for Medicine: Contact United Way:  call "211" or [facility] ([phone].  No Primary Care Doctor: - Call [facility]  [phone] - can help you locate a primary care doctor that  accepts your insurance, provides certain services, etc. - Physician Referral Service- [phone]  Agencies that provide inexpensive medical care: - [facility]  [phone] - [facility]  [phone] - [facility]  [phone] - Women's Clinic  [phone] - Planned Parenthood  [phone] [facility]  [phone]  Medicaid-accepting [facility] Providers: - [facility]- [address]  [phone], Mon-Fri 9am-7pm, Sat 9am-1pm - [facility]- [address] [place], [address] [place]  [phone] - [facility]- [address] [place]  [phone] [facility]- [address]  [phone] - Renaye Rakers- [address] [place], [address], [phone]  Only accepts [place] patients after they have their name  applied to their card  Self Pay (no insurance) in [place]: - Sickle Cell Patients: Dr Willey Blade, [facility]  [address] [place], [phone] - [facility]- [address] [place]  [phone]       [facility] [place]- [address]       -     [facility]- see information  above (Speak to [employer] if you do not have insurance)       -  [facility]- [address] [place], [phone]       -  [facility]- [address],  [phone]       -  [facility]- [address] [phone]       -  Dr Julio Sicks-  [address], [place], [phone]       -  [facility]- [address], [phone]       -  [facility]- [address] [phone], also [address], [phone]       -    [facility]- [address] [place], [phone], 1st & 3rd Saturday   every month, 10am-1pm  1) Find a Doctor and Pay Out of Pocket Although you won't have to find out who is covered by your insurance plan, it is a good idea to ask around and get recommendations. You will then need to call the office and see if the doctor you have chosen will accept you as a new patient and what types of options they offer for patients who are self-pay. Some doctors offer discounts or will set up payment plans for their patients who do not have insurance, but you will need to ask so you aren't surprised when you get to your appointment.  2) Contact Your Local Health Department Not all health departments have doctors that can see patients  for sick visits, but many do, so it is worth a call to see if yours does. If you don't know where your local health department is, you can check in your phone book. The CDC also has a tool to help you locate your state's health department, and many state websites also have listings of all of their local health departments.  3) Find a Walk-in Clinic If your illness is not likely to be very severe or complicated, you may want to try a walk in clinic. These are popping up all over the country in pharmacies, drugstores, and shopping centers. They're usually staffed by nurse practitioners or physician assistants that have been trained to treat common illnesses and complaints. They're usually fairly quick and  inexpensive. However, if you have serious medical issues or chronic medical problems, these are probably not your best option  STD Testing - [facility] Department of [facility] [place], STD Clinic, [address], [place], phone [phone] or [phone].  Monday - Friday, call for an appointment. [facility] of [employer], STD Clinic, [address], [place], phone [phone] or [phone].  Monday - Friday, call for an appointment.  Abuse/Neglect: [facility] Child Abuse Hotline 228-134-4894 [facility] Child Abuse Hotline 667-068-3279 (After Hours)  Emergency Shelter:  [facility] 380-359-1181  Maternity Homes: - Room at the [place] [facility] 2266380889 - [facility] [phone]  MRSA Hotline #:   (505) 120-9560  [facility] Resources  [facility]  [employer] [facility] [address]                 [address]         [address] [place] [address]  Blondell Reveal Phone:  [phone]                                  Phone:  [phone]                   Phone:  [phone]  [facility] Mental [facility], [phone] - [phone]       -     [facility] in [place], [address],                                  [phone], [facility] Child Abuse Hotline 7172645592 or (973)221-6507 (After Hours)   Behavioral Health Services  Substance Abuse Resources: - [facility] Drug Services  [phone] - [facility] [phone] - [facility] [place] [phone] Floydene Flock [phone] - Residential & Outpatient Substance Abuse Program  ([phone]  Psychological Services: [facility]  [phone]  Services  [phone] - [facility], [phone] [place]. [address] [place], ACCESS  LINE: 641-401-8411 or (223)[phone], [employer]za  Dental Assistance  If unable to pay or uninsured, contact:  [facility] or [facility]. to become qualified for the adult dental clinic.  Patients with Medicaid: [facility] [phone] [address], [phone] [address] [phone]  If unable to pay, or uninsured, contact [facility] [phone]) or [facility] Department [phone] in [place], [phone] in [facility]) to become qualified for the adult dental clinic  Other Low-Cost Community Dental Services: - [facility]- [address] [place], [place], [place], [address], [phone], [phone], 2nd and 4th Thursday of the month at 6:30am.  10 clients each day by appointment, can sometimes see walk-in patients if someone does not show for an appointment. [facility]- [address] [place], [place], [address], [phone] - [facility]- [address] [place], [place], [address], [phone] - [facility]- [phone] [facility]- [phone] [facility] Department- [phone]

## 2011-09-18 NOTE — ED Notes (Signed)
Pt c/o left knee pain and chronic abd pain.

## 2011-09-26 ENCOUNTER — Emergency Department (HOSPITAL_COMMUNITY)
Admission: EM | Admit: 2011-09-26 | Discharge: 2011-09-26 | Disposition: A | Discharge status: Home / Self Care | Payer: Self-pay | Attending: Emergency Medicine | Admitting: Emergency Medicine

## 2011-09-26 ENCOUNTER — Encounter (HOSPITAL_COMMUNITY): Payer: Self-pay | Admitting: Emergency Medicine

## 2011-09-26 DIAGNOSIS — G4730 Sleep apnea, unspecified: Secondary | ICD-10-CM | POA: Insufficient documentation

## 2011-09-26 DIAGNOSIS — F341 Dysthymic disorder: Secondary | ICD-10-CM | POA: Insufficient documentation

## 2011-09-26 DIAGNOSIS — R1110 Vomiting, unspecified: Secondary | ICD-10-CM | POA: Insufficient documentation

## 2011-09-26 DIAGNOSIS — R109 Unspecified abdominal pain: Secondary | ICD-10-CM | POA: Insufficient documentation

## 2011-09-26 DIAGNOSIS — Z79899 Other long term (current) drug therapy: Secondary | ICD-10-CM | POA: Insufficient documentation

## 2011-09-26 DIAGNOSIS — F17200 Nicotine dependence, unspecified, uncomplicated: Secondary | ICD-10-CM | POA: Insufficient documentation

## 2011-09-26 LAB — COMPREHENSIVE METABOLIC PANEL
ALT: 33 U/L (ref 0–35)
BUN: 12 mg/dL (ref 6–23)
CO2: 29 mEq/L (ref 19–32)
Calcium: 9.7 mg/dL (ref 8.4–10.5)
GFR calc Af Amer: >90 mL/min (ref >90)
GFR calc non Af Amer: >90 mL/min (ref >90)
Glucose, Bld: 114 mg/dL — ABNORMAL HIGH (ref 70–99)
Sodium: 136 mEq/L (ref 135–145)

## 2011-09-26 LAB — CBC
HCT: 39.1 % (ref 36.0–46.0)
Hemoglobin: 12.6 g/dL (ref 12.0–15.0)
MCH: 30 pg (ref 26.0–34.0)
MCV: 93.1 fL (ref 78.0–100.0)
Platelets: 441 10*3/uL — ABNORMAL HIGH (ref 150–400)
RBC: 4.2 MIL/uL (ref 3.87–5.11)
WBC: 10.4 10*3/uL (ref 4.0–10.5)

## 2011-09-26 LAB — URINALYSIS, ROUTINE W REFLEX MICROSCOPIC
Bilirubin Urine: NEGATIVE
Hgb urine dipstick: NEGATIVE
Protein, ur: NEGATIVE mg/dL
Urobilinogen, UA: 0.2 mg/dL (ref 0.0–1.0)

## 2011-09-26 LAB — DIFFERENTIAL
Eosinophils Relative: 1 % (ref 0–5)
Lymphocytes Relative: 15 % (ref 12–46)
Lymphs Abs: 1.5 10*3/uL (ref 0.7–4.0)
Monocytes Absolute: 0.9 10*3/uL (ref 0.1–1.0)
Monocytes Relative: 9 % (ref 3–12)

## 2011-09-26 LAB — LIPASE, BLOOD: Lipase: 46 U/L (ref 11–59)

## 2011-09-26 LAB — PREGNANCY, URINE: Preg Test, Ur: NEGATIVE

## 2011-09-26 MED ORDER — HYDROMORPHONE HCL PF 1 MG/ML IJ SOLN
1.0000 mg | Freq: Once | INTRAMUSCULAR | Status: AC
  Administered 2011-09-26: 1 mg via INTRAVENOUS
  Filled 2011-09-26: qty 1

## 2011-09-26 MED ORDER — ONDANSETRON HCL 4 MG/2ML IJ SOLN
4.0000 mg | Freq: Once | INTRAMUSCULAR | Status: AC
  Administered 2011-09-26: 4 mg via INTRAVENOUS
  Filled 2011-09-26: qty 2

## 2011-09-26 MED ORDER — SODIUM CHLORIDE 0.9 % IV BOLUS (SEPSIS)
1000.0000 mL | Freq: Once | INTRAVENOUS | Status: AC
  Administered 2011-09-26: 1000 mL via INTRAVENOUS

## 2011-09-26 MED ORDER — OXYCODONE-ACETAMINOPHEN 5-325 MG PO TABS: 1.0000 | ORAL_TABLET | ORAL | Status: AC | PRN

## 2011-09-26 MED ORDER — OXYCODONE-ACETAMINOPHEN 5-325 MG PO TABS: 1.0000 | ORAL_TABLET | ORAL | Status: DC | PRN
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
## 2011-09-26 NOTE — ED Notes (Signed)
Pt c/o n/v/d since 0200 this am. H/s chronic pancreatitis.

## 2011-09-28 ENCOUNTER — Encounter (HOSPITAL_COMMUNITY): Payer: Self-pay | Admitting: *Deleted

## 2011-09-28 ENCOUNTER — Emergency Department (HOSPITAL_COMMUNITY)
Admission: EM | Admit: 2011-09-28 | Discharge: 2011-09-29 | Disposition: A | Discharge status: Home / Self Care | Payer: Self-pay | Attending: Emergency Medicine | Admitting: Emergency Medicine

## 2011-09-28 DIAGNOSIS — R109 Unspecified abdominal pain: Secondary | ICD-10-CM | POA: Insufficient documentation

## 2011-09-28 DIAGNOSIS — Z79899 Other long term (current) drug therapy: Secondary | ICD-10-CM | POA: Insufficient documentation

## 2011-09-28 DIAGNOSIS — F17200 Nicotine dependence, unspecified, uncomplicated: Secondary | ICD-10-CM | POA: Insufficient documentation

## 2011-09-28 DIAGNOSIS — K859 Acute pancreatitis without necrosis or infection, unspecified: Secondary | ICD-10-CM

## 2011-09-28 DIAGNOSIS — R112 Nausea with vomiting, unspecified: Secondary | ICD-10-CM | POA: Insufficient documentation

## 2011-09-28 DIAGNOSIS — F411 Generalized anxiety disorder: Secondary | ICD-10-CM | POA: Insufficient documentation

## 2011-09-28 LAB — COMPREHENSIVE METABOLIC PANEL
ALT: 33 U/L (ref 0–35)
AST: 21 U/L (ref 0–37)
Albumin: 3.7 g/dL (ref 3.5–5.2)
CO2: 31 mEq/L (ref 19–32)
Calcium: 9.7 mg/dL (ref 8.4–10.5)
Chloride: 100 mEq/L (ref 96–112)
GFR calc non Af Amer: >90 mL/min (ref >90)
Sodium: 139 mEq/L (ref 135–145)
Total Bilirubin: 0.1 mg/dL — ABNORMAL LOW (ref 0.3–1.2)

## 2011-09-28 LAB — CBC
MCHC: 32.7 g/dL (ref 30.0–36.0)
Platelets: 425 10*3/uL — ABNORMAL HIGH (ref 150–400)
RDW: 14 % (ref 11.5–15.5)
WBC: 7.3 10*3/uL (ref 4.0–10.5)

## 2011-09-28 LAB — DIFFERENTIAL
Basophils Absolute: 0 10*3/uL (ref 0.0–0.1)
Basophils Relative: 0 % (ref 0–1)
Lymphocytes Relative: 32 % (ref 12–46)
Neutro Abs: 3.8 10*3/uL (ref 1.7–7.7)

## 2011-09-28 LAB — URINALYSIS, ROUTINE W REFLEX MICROSCOPIC
Glucose, UA: NEGATIVE mg/dL
Hgb urine dipstick: NEGATIVE
Leukocytes, UA: NEGATIVE
Protein, ur: NEGATIVE mg/dL
pH: 6 (ref 5.0–8.0)

## 2011-09-28 MED ORDER — HYDROMORPHONE HCL PF 1 MG/ML IJ SOLN
1.0000 mg | Freq: Once | INTRAMUSCULAR | Status: AC
  Administered 2011-09-28: 1 mg via INTRAVENOUS
  Filled 2011-09-28: qty 1

## 2011-09-28 MED ORDER — SODIUM CHLORIDE 0.9 % IV SOLN: INTRAVENOUS | Status: DC

## 2011-09-28 MED ORDER — ONDANSETRON HCL 4 MG/2ML IJ SOLN
4.0000 mg | Freq: Once | INTRAMUSCULAR | Status: AC
  Administered 2011-09-28: 4 mg via INTRAVENOUS
  Filled 2011-09-28: qty 2

## 2011-09-28 MED ORDER — ONDANSETRON 8 MG PO TBDP
8.0000 mg | ORAL_TABLET | Freq: Once | ORAL | Status: AC
  Administered 2011-09-28: 8 mg via ORAL
  Filled 2011-09-28: qty 1

## 2011-09-28 MED ORDER — MORPHINE SULFATE 4 MG/ML IJ SOLN
6.0000 mg | Freq: Once | INTRAMUSCULAR | Status: AC
  Administered 2011-09-28: 6 mg via INTRAVENOUS
  Filled 2011-09-28: qty 2

## 2011-09-28 MED ORDER — SODIUM CHLORIDE 0.9 % IV SOLN
Freq: Once | INTRAVENOUS | Status: AC
  Administered 2011-09-28: 20:00:00 via INTRAVENOUS

## 2011-09-28 MED ORDER — SODIUM CHLORIDE 0.9 % IV BOLUS (SEPSIS)
1000.0000 mL | Freq: Once | INTRAVENOUS | Status: AC
  Administered 2011-09-28: 1000 mL via INTRAVENOUS

## 2011-09-28 MED ORDER — DIPHENHYDRAMINE HCL 50 MG/ML IJ SOLN
25.0000 mg | Freq: Once | INTRAMUSCULAR | Status: AC
  Administered 2011-09-28: 25 mg via INTRAVENOUS
  Filled 2011-09-28: qty 1

## 2011-09-28 MED ORDER — OXYCODONE-ACETAMINOPHEN 5-325 MG PO TABS
1.0000 | ORAL_TABLET | Freq: Once | ORAL | Status: AC
  Administered 2011-09-28: 1 via ORAL
  Filled 2011-09-28: qty 1

## 2011-09-28 MED ORDER — PROMETHAZINE HCL 25 MG/ML IJ SOLN
12.5000 mg | Freq: Once | INTRAMUSCULAR | Status: AC
  Administered 2011-09-28: 20:00:00 via INTRAVENOUS
  Filled 2011-09-28: qty 1

## 2011-09-28 NOTE — ED Notes (Signed)
Abd pain , n/v  Seen here 6/9 and released. Hx of pancretitis.
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
## 2011-09-29 MED ORDER — OXYCODONE-ACETAMINOPHEN 5-325 MG PO TABS
1.0000 | ORAL_TABLET | Freq: Once | ORAL | Status: AC
Start: 2011-09-29 — End: 2011-09-29
  Administered 2011-09-29: 1 via ORAL
  Filled 2011-09-29: qty 1

## 2011-09-29 MED ORDER — ONDANSETRON 8 MG PO TBDP: 8.0000 mg | ORAL_TABLET | Freq: Three times a day (TID) | ORAL | Status: AC | PRN

## 2011-09-29 MED ORDER — ONDANSETRON 8 MG PO TBDP
8.0000 mg | ORAL_TABLET | Freq: Once | ORAL | Status: AC
  Administered 2011-09-29: 8 mg via ORAL
  Filled 2011-09-29: qty 1

## 2011-09-29 MED ORDER — OXYCODONE-ACETAMINOPHEN 5-325 MG PO TABS: 1.0000 | ORAL_TABLET | Freq: Four times a day (QID) | ORAL | Status: AC | PRN

## 2011-09-29 NOTE — Discharge Instructions (Signed)
Please read and follow all provided instructions.  Your diagnoses today include:  1. Pancreatitis     Tests performed today include:  Lipase - was slightly elevated  Other blood tests were normal  Vital signs. See below for your results today.   Medications prescribed:   Percocet (oxycodone/acetaminophen) - narcotic pain medication  You have been prescribed narcotic pain medication such as Vicodin or Percocet: DO NOT drive or perform any activities that require you to be awake and alert because this medicine can make you drowsy. BE VERY CAREFUL not to take multiple medicines containing Tylenol (also called acetaminophen). Doing so can lead to an overdose which can damage your liver and cause liver failure and possibly death.    Zofran (ondansetron) - for nausea and vomiting  Take any prescribed medications only as directed.  Home care instructions:  Follow any educational materials contained in this packet.  BE VERY CAREFUL not to take multiple medicines containing Tylenol (also called acetaminophen). Doing so can lead to an overdose which can damage your liver and cause liver failure and possibly death.   Drink only clear liquids for the next 24 hours.   Follow-up instructions: Please follow-up with your primary care provider in the next 3 days for further evaluation of your symptoms. If you do not have a primary care doctor -- see below for referral information.   Return instructions:   Please return to the Emergency Department if you experience worsening symptoms.   Return with fever, worsening pain, persistent vomiting.   Please return if you have any other emergent concerns.  Additional Information:  Your vital signs today were: BP 110/76  Pulse 80  Temp(Src) 98.2 F (36.8 C) (Oral)  Resp 18  Ht 5' 4.5" (1.638 m)  Wt 104 lb (47.174 kg)  BMI 17.58 kg/m2  SpO2 98%  LMP 09/19/2011 If your blood pressure (BP) was elevated above 135/85 this visit, please have  this repeated by your doctor within one month. -------------- No Primary Care Doctor Call [facility]  [phone] Other agencies that provide inexpensive medical care    [facility]  [phone]    [facility]  [phone]    [facility]  [phone]    [facility]  [phone]    [facility]  [phone]    [facility]  [phone] -------------- RESOURCE GUIDE:  Dental Problems  Patients with Medicaid: [facility] [phone] [address]                                            [phone] [address] Phone:  [phone]                                                   Phone:  [phone]  If unable to pay or uninsured, contact:  [facility] or [facility]. to become qualified for the adult dental clinic.  Chronic Pain Problems Contact [facility]  [phone] Patients need to be referred by their primary care doctor.  Insufficient Money for Medicine Contact [employer]:  call "211" or [facility] [phone].  Psychological Services [facility]  [phone] [facility]  [phone] [facility]   [phone] (emergency services [phone])  Substance Abuse Resources [facility]  [phone] [facility] [phone] [facility] [place] [phone] Floydene Flock [phone] Residential & Outpatient Substance Abuse Program  [phone]  Abuse/Neglect [facility] Child Abuse Hotline [phone] [phone] [facility] Child Abuse Hotline 848-764-2193 (After Hours)  Emergency Shelter [facility] [phone]  Maternity Homes [facility] [phone] [facility] [phone]  [facility] Resources  [facility]     [employer]                          [facility] Dept. [address]  [place]                       [address]      [address]  Blondell Reveal Phone:  [phone]                                   Phone:  [phone]                 Phone:  [phone]  [facility] Phone:  [phone]  [facility] Child Abuse Hotline [phone] (After Hours)

## 2011-09-30 NOTE — ED Provider Notes (Signed)
Medical screening examination/treatment/procedure(s) were performed by non-physician practitioner and as supervising physician I was immediately available for consultation/collaboration. Jamayia Croker, MD, FACEP   Renie Stelmach L Dima Ferrufino, MD 09/30/11 2104 

## 2011-10-02 NOTE — ED Provider Notes (Signed)
Medical screening examination/treatment/procedure(s) were performed by non-physician practitioner and as supervising physician I was immediately available for consultation/collaboration.  Donnetta Hutching, MD 10/02/11 [phone]

## 2011-12-23 ENCOUNTER — Encounter (HOSPITAL_COMMUNITY): Payer: Self-pay

## 2011-12-23 ENCOUNTER — Emergency Department (HOSPITAL_COMMUNITY)
Admission: EM | Admit: 2011-12-23 | Discharge: 2011-12-23 | Disposition: A | Discharge status: Home / Self Care | Payer: Self-pay | Attending: Emergency Medicine | Admitting: Emergency Medicine

## 2011-12-23 ENCOUNTER — Emergency Department (HOSPITAL_COMMUNITY): Payer: Self-pay

## 2011-12-23 DIAGNOSIS — R109 Unspecified abdominal pain: Secondary | ICD-10-CM | POA: Insufficient documentation

## 2011-12-23 DIAGNOSIS — Z79899 Other long term (current) drug therapy: Secondary | ICD-10-CM | POA: Insufficient documentation

## 2011-12-23 DIAGNOSIS — I10 Essential (primary) hypertension: Secondary | ICD-10-CM | POA: Insufficient documentation

## 2011-12-23 DIAGNOSIS — F411 Generalized anxiety disorder: Secondary | ICD-10-CM | POA: Insufficient documentation

## 2011-12-23 DIAGNOSIS — F3289 Other specified depressive episodes: Secondary | ICD-10-CM | POA: Insufficient documentation

## 2011-12-23 DIAGNOSIS — G4730 Sleep apnea, unspecified: Secondary | ICD-10-CM | POA: Insufficient documentation

## 2011-12-23 DIAGNOSIS — R1110 Vomiting, unspecified: Secondary | ICD-10-CM | POA: Insufficient documentation

## 2011-12-23 DIAGNOSIS — F17200 Nicotine dependence, unspecified, uncomplicated: Secondary | ICD-10-CM | POA: Insufficient documentation

## 2011-12-23 DIAGNOSIS — K861 Other chronic pancreatitis: Secondary | ICD-10-CM | POA: Insufficient documentation

## 2011-12-23 DIAGNOSIS — F329 Major depressive disorder, single episode, unspecified: Secondary | ICD-10-CM | POA: Insufficient documentation

## 2011-12-23 HISTORY — DX: Essential (primary) hypertension: I10

## 2011-12-23 LAB — COMPREHENSIVE METABOLIC PANEL
AST: 17 U/L (ref 0–37)
BUN: 9 mg/dL (ref 6–23)
CO2: 27 mEq/L (ref 19–32)
Calcium: 10.5 mg/dL (ref 8.4–10.5)
Chloride: 100 mEq/L (ref 96–112)
Creatinine, Ser: 0.6 mg/dL (ref 0.50–1.10)
GFR calc Af Amer: >90 mL/min (ref >90)
GFR calc non Af Amer: >90 mL/min (ref >90)
Glucose, Bld: 109 mg/dL — ABNORMAL HIGH (ref 70–99)
Total Bilirubin: 0.5 mg/dL (ref 0.3–1.2)

## 2011-12-23 LAB — CBC WITH DIFFERENTIAL/PLATELET
Basophils Absolute: 0 10*3/uL (ref 0.0–0.1)
Eosinophils Relative: 0 % (ref 0–5)
HCT: 42.3 % (ref 36.0–46.0)
Hemoglobin: 14.1 g/dL (ref 12.0–15.0)
Lymphocytes Relative: 14 % (ref 12–46)
Lymphs Abs: 2.5 10*3/uL (ref 0.7–4.0)
MCV: 87 fL (ref 78.0–100.0)
Monocytes Absolute: 1.3 10*3/uL — ABNORMAL HIGH (ref 0.1–1.0)
Monocytes Relative: 7 % (ref 3–12)
Neutro Abs: 13.7 10*3/uL — ABNORMAL HIGH (ref 1.7–7.7)
RDW: 15.1 % (ref 11.5–15.5)
WBC: 17.5 10*3/uL — ABNORMAL HIGH (ref 4.0–10.5)

## 2011-12-23 LAB — LIPASE, BLOOD: Lipase: 17 U/L (ref 11–59)

## 2011-12-23 MED ORDER — SODIUM CHLORIDE 0.9 % IV BOLUS (SEPSIS)
1000.0000 mL | Freq: Once | INTRAVENOUS | Status: AC
  Administered 2011-12-23: 1000 mL via INTRAVENOUS

## 2011-12-23 MED ORDER — HYDROMORPHONE HCL PF 1 MG/ML IJ SOLN
1.0000 mg | Freq: Once | INTRAMUSCULAR | Status: AC
  Administered 2011-12-23: 1 mg via INTRAVENOUS
  Filled 2011-12-23: qty 1

## 2011-12-23 MED ORDER — ONDANSETRON HCL 4 MG/2ML IJ SOLN
4.0000 mg | Freq: Once | INTRAMUSCULAR | Status: AC
  Administered 2011-12-23: 4 mg via INTRAVENOUS
  Filled 2011-12-23: qty 2

## 2011-12-23 MED ORDER — OXYCODONE-ACETAMINOPHEN 5-325 MG PO TABS: 1.0000 | ORAL_TABLET | Freq: Four times a day (QID) | ORAL | Status: AC | PRN

## 2011-12-23 MED ORDER — PROMETHAZINE HCL 25 MG PO TABS: 25.0000 mg | ORAL_TABLET | Freq: Four times a day (QID) | ORAL | Status: DC | PRN

## 2011-12-23 MED ORDER — SODIUM CHLORIDE 0.9 % IV SOLN: INTRAVENOUS | Status: DC

## 2011-12-23 NOTE — ED Notes (Signed)
Pt with history of pancreatitis, states pain and nausea and vomiting onset last evening.
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
## 2011-12-23 NOTE — ED Notes (Signed)
Pt complaining of nausea and states she has been vomiting all day. Ordered 4mg  zofran per protocol

## 2011-12-23 NOTE — ED Notes (Signed)
Pt requesting pain medication; MD aware.

## 2011-12-27 ENCOUNTER — Encounter (HOSPITAL_COMMUNITY): Payer: Self-pay | Admitting: Emergency Medicine

## 2011-12-27 ENCOUNTER — Emergency Department (HOSPITAL_COMMUNITY)
Admission: EM | Admit: 2011-12-27 | Discharge: 2011-12-27 | Disposition: A | Discharge status: Home / Self Care | Payer: Self-pay | Attending: Emergency Medicine | Admitting: Emergency Medicine

## 2011-12-27 DIAGNOSIS — Z833 Family history of diabetes mellitus: Secondary | ICD-10-CM | POA: Insufficient documentation

## 2011-12-27 DIAGNOSIS — I10 Essential (primary) hypertension: Secondary | ICD-10-CM | POA: Insufficient documentation

## 2011-12-27 DIAGNOSIS — Z9109 Other allergy status, other than to drugs and biological substances: Secondary | ICD-10-CM | POA: Insufficient documentation

## 2011-12-27 DIAGNOSIS — F329 Major depressive disorder, single episode, unspecified: Secondary | ICD-10-CM | POA: Insufficient documentation

## 2011-12-27 DIAGNOSIS — Z8249 Family history of ischemic heart disease and other diseases of the circulatory system: Secondary | ICD-10-CM | POA: Insufficient documentation

## 2011-12-27 DIAGNOSIS — F17200 Nicotine dependence, unspecified, uncomplicated: Secondary | ICD-10-CM | POA: Insufficient documentation

## 2011-12-27 DIAGNOSIS — F3289 Other specified depressive episodes: Secondary | ICD-10-CM | POA: Insufficient documentation

## 2011-12-27 DIAGNOSIS — Z825 Family history of asthma and other chronic lower respiratory diseases: Secondary | ICD-10-CM | POA: Insufficient documentation

## 2011-12-27 DIAGNOSIS — Z800 Family history of malignant neoplasm of digestive organs: Secondary | ICD-10-CM | POA: Insufficient documentation

## 2011-12-27 DIAGNOSIS — Z91038 Other insect allergy status: Secondary | ICD-10-CM | POA: Insufficient documentation

## 2011-12-27 DIAGNOSIS — Z8489 Family history of other specified conditions: Secondary | ICD-10-CM | POA: Insufficient documentation

## 2011-12-27 DIAGNOSIS — F411 Generalized anxiety disorder: Secondary | ICD-10-CM | POA: Insufficient documentation

## 2011-12-27 DIAGNOSIS — R109 Unspecified abdominal pain: Secondary | ICD-10-CM | POA: Insufficient documentation

## 2011-12-27 LAB — CBC WITH DIFFERENTIAL/PLATELET
Eosinophils Relative: 2 % (ref 0–5)
HCT: 44.7 % (ref 36.0–46.0)
Hemoglobin: 15.2 g/dL — ABNORMAL HIGH (ref 12.0–15.0)
Lymphocytes Relative: 32 % (ref 12–46)
Lymphs Abs: 2.5 10*3/uL (ref 0.7–4.0)
MCV: 86.8 fL (ref 78.0–100.0)
Monocytes Absolute: 0.7 10*3/uL (ref 0.1–1.0)
Monocytes Relative: 9 % (ref 3–12)
RBC: 5.15 MIL/uL — ABNORMAL HIGH (ref 3.87–5.11)
WBC: 7.8 10*3/uL (ref 4.0–10.5)

## 2011-12-27 LAB — COMPREHENSIVE METABOLIC PANEL
AST: 17 U/L (ref 0–37)
Albumin: 5.1 g/dL (ref 3.5–5.2)
Chloride: 99 mEq/L (ref 96–112)
Creatinine, Ser: 0.67 mg/dL (ref 0.50–1.10)
Total Bilirubin: 0.4 mg/dL (ref 0.3–1.2)

## 2011-12-27 MED ORDER — ONDANSETRON HCL 4 MG/2ML IJ SOLN
4.0000 mg | Freq: Once | INTRAMUSCULAR | Status: AC
  Administered 2011-12-27: 4 mg via INTRAVENOUS
  Filled 2011-12-27: qty 2

## 2011-12-27 MED ORDER — HYDROMORPHONE HCL PF 1 MG/ML IJ SOLN
1.0000 mg | Freq: Once | INTRAMUSCULAR | Status: AC
  Administered 2011-12-27: 1 mg via INTRAVENOUS
  Filled 2011-12-27: qty 1

## 2011-12-27 MED ORDER — PROMETHAZINE HCL 25 MG PO TABS: 25.0000 mg | ORAL_TABLET | Freq: Four times a day (QID) | ORAL | Status: DC | PRN

## 2011-12-27 MED ORDER — SODIUM CHLORIDE 0.9 % IV BOLUS (SEPSIS)
1000.0000 mL | Freq: Once | INTRAVENOUS | Status: AC
  Administered 2011-12-27: 1000 mL via INTRAVENOUS

## 2011-12-27 MED ORDER — OXYCODONE-ACETAMINOPHEN 5-325 MG PO TABS: 1.0000 | ORAL_TABLET | Freq: Four times a day (QID) | ORAL | Status: AC | PRN

## 2011-12-27 NOTE — ED Notes (Signed)
Left in c/o spouse for transport home.  Instructions/prescriptions reviewed and f/u info provided.  In no apparent distress.  Verbalizes understanding of instructions given.

## 2011-12-27 NOTE — ED Notes (Signed)
Pt states pain is more intense and didn't know if labs should be rechecked. C/o vomiting last night.

## 2011-12-27 NOTE — ED Notes (Signed)
Called to pt's room; she reports she has sharp, shooting pain returning to her upper abd.  EDP notified.

## 2011-12-27 NOTE — ED Notes (Signed)
Pt states she has had persistent nausea since last at ED on Tuesday for persistent pancreatitis, Phenergan helped but out of med.
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
## 2012-01-17 ENCOUNTER — Encounter (HOSPITAL_COMMUNITY): Payer: Self-pay | Admitting: *Deleted

## 2012-01-17 ENCOUNTER — Inpatient Hospital Stay (HOSPITAL_COMMUNITY)
Admission: EM | Admit: 2012-01-17 | Discharge: 2012-01-20 | DRG: 440 | Disposition: A | Discharge status: Home / Self Care | Payer: Self-pay | Attending: Family Medicine | Admitting: Family Medicine

## 2012-01-17 DIAGNOSIS — F1210 Cannabis abuse, uncomplicated: Secondary | ICD-10-CM | POA: Diagnosis present

## 2012-01-17 DIAGNOSIS — I10 Essential (primary) hypertension: Secondary | ICD-10-CM | POA: Diagnosis present

## 2012-01-17 DIAGNOSIS — Z79899 Other long term (current) drug therapy: Secondary | ICD-10-CM

## 2012-01-17 DIAGNOSIS — K861 Other chronic pancreatitis: Secondary | ICD-10-CM | POA: Diagnosis present

## 2012-01-17 DIAGNOSIS — J4489 Other specified chronic obstructive pulmonary disease: Secondary | ICD-10-CM | POA: Diagnosis present

## 2012-01-17 DIAGNOSIS — F17200 Nicotine dependence, unspecified, uncomplicated: Secondary | ICD-10-CM | POA: Diagnosis present

## 2012-01-17 DIAGNOSIS — F411 Generalized anxiety disorder: Secondary | ICD-10-CM | POA: Diagnosis present

## 2012-01-17 DIAGNOSIS — F329 Major depressive disorder, single episode, unspecified: Secondary | ICD-10-CM | POA: Diagnosis present

## 2012-01-17 DIAGNOSIS — J449 Chronic obstructive pulmonary disease, unspecified: Secondary | ICD-10-CM | POA: Diagnosis present

## 2012-01-17 DIAGNOSIS — F3289 Other specified depressive episodes: Secondary | ICD-10-CM | POA: Diagnosis present

## 2012-01-17 DIAGNOSIS — G4730 Sleep apnea, unspecified: Secondary | ICD-10-CM | POA: Diagnosis present

## 2012-01-17 DIAGNOSIS — K859 Acute pancreatitis without necrosis or infection, unspecified: Principal | ICD-10-CM | POA: Diagnosis present

## 2012-01-17 LAB — URINALYSIS, ROUTINE W REFLEX MICROSCOPIC
Ketones, ur: >80 mg/dL — AB
Leukocytes, UA: NEGATIVE
Nitrite: NEGATIVE
Urobilinogen, UA: 1 mg/dL (ref 0.0–1.0)
pH: 6 (ref 5.0–8.0)

## 2012-01-17 LAB — CBC WITH DIFFERENTIAL/PLATELET
Basophils Absolute: 0 10*3/uL (ref 0.0–0.1)
Basophils Relative: 0 % (ref 0–1)
Eosinophils Relative: 1 % (ref 0–5)
HCT: 43.7 % (ref 36.0–46.0)
MCHC: 34.3 g/dL (ref 30.0–36.0)
MCV: 86.4 fL (ref 78.0–100.0)
Monocytes Absolute: 0.8 10*3/uL (ref 0.1–1.0)
RDW: 15.1 % (ref 11.5–15.5)

## 2012-01-17 LAB — URINE MICROSCOPIC-ADD ON

## 2012-01-17 LAB — COMPREHENSIVE METABOLIC PANEL
Alkaline Phosphatase: 86 U/L (ref 39–117)
BUN: 16 mg/dL (ref 6–23)
CO2: 24 mEq/L (ref 19–32)
Calcium: 10.5 mg/dL (ref 8.4–10.5)
GFR calc Af Amer: >90 mL/min (ref >90)
GFR calc non Af Amer: >90 mL/min (ref >90)
Glucose, Bld: 99 mg/dL (ref 70–99)
Potassium: 3.5 mEq/L (ref 3.5–5.1)
Total Protein: 8.5 g/dL — ABNORMAL HIGH (ref 6.0–8.3)

## 2012-01-17 MED ORDER — HYDROMORPHONE HCL PF 1 MG/ML IJ SOLN
1.0000 mg | INTRAMUSCULAR | Status: DC | PRN
  Administered 2012-01-18: 1 mg via INTRAVENOUS
  Filled 2012-01-17 (×2): qty 1

## 2012-01-17 MED ORDER — ONDANSETRON HCL 4 MG/2ML IJ SOLN
4.0000 mg | Freq: Three times a day (TID) | INTRAMUSCULAR | Status: DC | PRN
  Administered 2012-01-18: 4 mg via INTRAVENOUS
  Filled 2012-01-17: qty 2

## 2012-01-17 MED ORDER — ONDANSETRON HCL 4 MG/2ML IJ SOLN
4.0000 mg | Freq: Once | INTRAMUSCULAR | Status: AC
  Administered 2012-01-17: 4 mg via INTRAVENOUS
  Filled 2012-01-17: qty 2

## 2012-01-17 MED ORDER — HYDROMORPHONE HCL PF 1 MG/ML IJ SOLN
1.0000 mg | Freq: Once | INTRAMUSCULAR | Status: AC
  Administered 2012-01-17: 1 mg via INTRAVENOUS
  Filled 2012-01-17: qty 1

## 2012-01-17 MED ORDER — SODIUM CHLORIDE 0.9 % IV BOLUS (SEPSIS)
1000.0000 mL | Freq: Once | INTRAVENOUS | Status: AC
  Administered 2012-01-17: 1000 mL via INTRAVENOUS

## 2012-01-17 MED ORDER — ENOXAPARIN SODIUM 40 MG/0.4ML ~~LOC~~ SOLN
40.0000 mg | SUBCUTANEOUS | Status: DC
  Filled 2012-01-17: qty 0.4

## 2012-01-17 MED ORDER — ALBUTEROL SULFATE HFA 108 (90 BASE) MCG/ACT IN AERS: 2.0000 | INHALATION_SPRAY | RESPIRATORY_TRACT | Status: DC | PRN

## 2012-01-17 MED ORDER — ZOLPIDEM TARTRATE 5 MG PO TABS
5.0000 mg | ORAL_TABLET | Freq: Every evening | ORAL | Status: DC | PRN
  Administered 2012-01-18 – 2012-01-19 (×3): 5 mg via ORAL
  Filled 2012-01-17 (×3): qty 1

## 2012-01-17 MED ORDER — SODIUM CHLORIDE 0.9 % IV SOLN
INTRAVENOUS | Status: DC
  Administered 2012-01-17 – 2012-01-19 (×4): via INTRAVENOUS
  Administered 2012-01-19 (×2): 125 mL via INTRAVENOUS
  Administered 2012-01-20: 03:00:00 via INTRAVENOUS

## 2012-01-17 MED ORDER — DIPHENHYDRAMINE HCL 50 MG/ML IJ SOLN
25.0000 mg | Freq: Once | INTRAMUSCULAR | Status: AC
  Administered 2012-01-17: 25 mg via INTRAVENOUS
  Filled 2012-01-17: qty 1
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
## 2012-01-17 NOTE — ED Notes (Signed)
abd pain, nausea, thinks it is flare up of pancreatitis.

## 2012-01-18 ENCOUNTER — Inpatient Hospital Stay (HOSPITAL_COMMUNITY): Payer: Self-pay

## 2012-01-18 MED ORDER — HYDROMORPHONE HCL PF 1 MG/ML IJ SOLN
1.0000 mg | INTRAMUSCULAR | Status: DC | PRN
  Administered 2012-01-18 – 2012-01-20 (×28): 1 mg via INTRAVENOUS
  Filled 2012-01-18 (×27): qty 1

## 2012-01-18 MED ORDER — DIPHENHYDRAMINE HCL 25 MG PO CAPS
25.0000 mg | ORAL_CAPSULE | Freq: Four times a day (QID) | ORAL | Status: DC | PRN
  Administered 2012-01-18 – 2012-01-20 (×7): 25 mg via ORAL
  Filled 2012-01-18 (×7): qty 1

## 2012-01-18 MED ORDER — ONDANSETRON HCL 4 MG/2ML IJ SOLN
4.0000 mg | INTRAMUSCULAR | Status: DC | PRN
  Administered 2012-01-18 – 2012-01-20 (×14): 4 mg via INTRAVENOUS
  Filled 2012-01-18 (×15): qty 2

## 2012-01-18 NOTE — Progress Notes (Signed)
INITIAL ADULT NUTRITION ASSESSMENT Date: 01/18/2012   Time: 11:10 AM Reason for Assessment: Malnutrition Screen Score=2  ASSESSMENT: Female 26 y.o.  Dx: Acute Pancreatitis  Past Medical History  Diagnosis Date  . Pancreatitis     pancreas divisum variant  . Anxiety   . Tobacco abuse   . Marijuana abuse     drug screen positive in May 2012; denies use X 2 mos as of Apr 14, 2011  . Osteomyelitis of leg     right tibia, 2009  . Sleep apnea   . Depression   . Pancreas divisum   . H. pylori infection 04/26/11  . Hypertension     Scheduled Meds:   . diphenhydrAMINE  25 mg Intravenous Once  . enoxaparin  40 mg Subcutaneous Q24H  .  HYDROmorphone (DILAUDID) injection  1 mg Intravenous Once  .  HYDROmorphone (DILAUDID) injection  1 mg Intravenous Once  .  HYDROmorphone (DILAUDID) injection  1 mg Intravenous Once  .  HYDROmorphone (DILAUDID) injection  1 mg Intravenous Once  . ondansetron  4 mg Intravenous Once  . ondansetron  4 mg Intravenous Once  . ondansetron  4 mg Intravenous Once  . sodium chloride  1,000 mL Intravenous Once   Continuous Infusions:   . sodium chloride 125 mL/hr at 01/18/12 0413   PRN Meds:.albuterol, diphenhydrAMINE, HYDROmorphone (DILAUDID) injection, ondansetron, zolpidem, DISCONTD:  HYDROmorphone (DILAUDID) injection, DISCONTD: ondansetron  Ht: 5\' 5"  (165.1 cm)  Wt: 117 lb (53.071 kg)  Ideal Wt: 57 kg  % Ideal Wt:94%  Usual Wt:  Wt Readings from Last 10 Encounters:  01/18/12 117 lb (53.071 kg)  12/27/11 115 lb (52.164 kg)  12/23/11 120 lb (54.432 kg)  09/28/11 104 lb (47.174 kg)  09/26/11 104 lb (47.174 kg)  09/18/11 104 lb (47.174 kg)  09/11/11 105 lb (47.628 kg)  07/15/11 106 lb 11.2 oz (48.4 kg)  07/14/11 107 lb (48.535 kg)  05/01/11 115 lb (52.164 kg)     Body mass index is 19.47 kg/(m^2). Normal range  Food/Nutrition Related Hx: Pt very pleasant young lady. She is currently NPO pending ultrasound per pt. Weight hx variable and  currently reflects severe wt gain of 12% since June.   C/o nausea today but feels like she could tolerate liquids. She does not currently meet criteria for malnutrition.  CMP     Component Value Date/Time   NA 138 01/17/2012 1601   K 3.5 01/17/2012 1601   CL 98 01/17/2012 1601   CO2 24 01/17/2012 1601   GLUCOSE 99 01/17/2012 1601   BUN 16 01/17/2012 1601   CREATININE 0.70 01/17/2012 1601   CALCIUM 10.5 01/17/2012 1601   PROT 8.5* 01/17/2012 1601   ALBUMIN 5.1 01/17/2012 1601   AST 16 01/17/2012 1601   ALT 13 01/17/2012 1601   ALKPHOS 86 01/17/2012 1601   BILITOT 0.6 01/17/2012 1601   GFRNONAA >90 01/17/2012 1601   GFRAA >90 01/17/2012 1601   Lipase     Component Value Date/Time   LIPASE 96* 01/17/2012 1601    No intake or output data in the 24 hours ending 01/18/12 1122  Diet Order: NPO  Supplements/Tube Feeding:none at this time  IVF:    sodium chloride Last Rate: 125 mL/hr at 01/18/12 0413    Estimated Nutritional Needs:   Kcal:1600-1855 kcal Protein:75-85 gr Fluid:1 ml/kcal  NUTRITION DIAGNOSIS: -Altered GI function (NI-1.4).  Status: Ongoing  RELATED TO: compromised exocrine function  AS EVIDENCE BY: acute pancreatitis  MONITORING/EVALUATION(Goals): Monitor for diet advancement  and tolerance Goal: Pt to meet >/= 90% of their estimated nutrition needs; not met  EDUCATION NEEDS: -No education needs identified at this time  INTERVENTION: -Rec when diet is advanced add oral supplement: Resource Breeze po TID, each supplement provides 250 kcal and 9 grams of protein.  Dietitian [phone]  DOCUMENTATION CODES Per approved criteria  -Not Applicable    [name] 01/18/2012, 11:10 AM

## 2012-01-18 NOTE — Progress Notes (Signed)
UR chart review completed.  

## 2012-01-18 NOTE — Progress Notes (Signed)
[id number] 

## 2012-01-18 NOTE — Care Management Note (Signed)
    Page 1 of 1   01/20/2012     3:17:52 PM   CARE MANAGEMENT NOTE 01/20/2012  Patient:  [name], [name]   Account Number:  [id number]  Date Initiated:  01/18/2012  Documentation initiated by:  Sharrie Rothman  Subjective/Objective Assessment:   Pt admitted from home with pancreatitis. Pt lives with her husband and will return home with him at discharge. Pt is independent with ADL's.     Action/Plan:   Lubertha Basque aware of pts self pay status. No CM or HH needs noted.   Anticipated DC Date:  01/21/2012   Anticipated DC Plan:  HOME/SELF CARE  In-house referral  Financial Counselor      DC Planning Services  CM consult      Choice offered to / List presented to:             Status of service:  Completed, signed off Medicare Important Message given?   (If response is "NO", the following Medicare IM given date fields will be blank) Date Medicare IM given:   Date Additional Medicare IM given:    Discharge Disposition:  HOME/SELF CARE  Per UR Regulation:    If discussed at Long Length of Stay Meetings, dates discussed:    Comments:  01/20/12 1515 [name], RN BSN CM Pt discharged home today. No CM or HH needs. Pt notified that no help is given for pain medications. Pt verblized understanding.  01/18/12 1442 [name], RN BSN CM

## 2012-01-18 NOTE — Progress Notes (Signed)
NAME:  [name], [name] NO.:  [id number]  MEDICAL RECORD NO.:  [id number]  LOCATION:  A206                          FACILITY:  [facility]  PHYSICIAN:  Melvyn Novas, MDDATE OF BIRTH:  Mar 04, 1986  DATE OF PROCEDURE: DATE OF DISCHARGE:                                PROGRESS NOTE   The patient has chronic and recurrent pancreatitis, comes in with epigastric pain again.  Amylase elevated at 96 this morning.  Blood pressure 141/85, temperature 97.7 pulse is 80 and regular, respiratory rate is 18, O2 sat is 100%, currently controlled on Dilaudid 1 mg IV q.2 h.  Lungs show prolonged expiratory phase.  No rales, wheeze, or rhonchi.  Heart, regular rhythm.  No murmurs, gallops, or rubs.  Some epigastric pain to palpation.  PLAN:  Right now is to obtain a serum pregnancy test, serial lipases, monitor clinically, clear liquid diet, and we will obtain sonogram of pancreas to make sure there is no architectural distortions.     Melvyn Novas, MD     RMD/MEDQ  D:  01/18/2012  T:  01/18/2012  Job:  [id number]
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
## 2012-01-19 MED ORDER — SODIUM CHLORIDE 0.9 % IJ SOLN
INTRAMUSCULAR | Status: AC
  Filled 2012-01-19: qty 3

## 2012-01-19 NOTE — Progress Notes (Signed)
[id number] 

## 2012-01-19 NOTE — Progress Notes (Signed)
NAME:  [name], [name] NO.:  [id number]  MEDICAL RECORD NO.:  [id number]  LOCATION:  A206                          FACILITY:  [facility]  PHYSICIAN:  Melvyn Novas, MDDATE OF BIRTH:  Jul 31, 1985  DATE OF PROCEDURE: DATE OF DISCHARGE:                                PROGRESS NOTE   Abdominal ultrasound reveals no significant architectural distortions of gallbladder.  Blood pressure 129/92, temperature 97.8, pulse is 81 and regular, respiratory rate is 20.  WBCs 8.0, hemoglobin 15.0.  The patient has significant pain today postprandially.  She also had some nausea.  Her lipases have normalized today.  There is some epigastric tenderness to palpation.  Heart regular rhythm.  No murmurs, gallops, or rubs.  Lungs are clear to A and P with prolonged expiratory phase.  PLAN:  Right now is to continue advance to full liquids to see she tolerates this.  Consider discharge within a day or so.     Melvyn Novas, MD     RMD/MEDQ  D:  01/19/2012  T:  01/19/2012  Job:  [id number]

## 2012-01-20 LAB — LIPASE, BLOOD: Lipase: 19 U/L (ref 11–59)

## 2012-01-20 MED ORDER — OXYCODONE HCL 5 MG PO TABS: 5.0000 mg | ORAL_TABLET | ORAL | Status: DC | PRN

## 2012-01-20 NOTE — Progress Notes (Signed)
Patient with orders to be discharge home. Discharge instructions given, patient verbalized understanding. Patient in stable condition. Patient left with mom via private vehicle.

## 2012-01-20 NOTE — Discharge Summary (Signed)
NAME:  [name], SALVAGGIO NO.:  [id number]  MEDICAL RECORD NO.:  [id number]  LOCATION:  A206                          FACILITY:  [facility]  PHYSICIAN:  Melvyn Novas, MDDATE OF BIRTH:  Jan 19, 1986  DATE OF ADMISSION:  01/17/2012 DATE OF DISCHARGE:  10/03/2013LH                              DISCHARGE SUMMARY   The patient has recurrent pancreatitis, has pancreas divisum, and denies trauma, alcohol intake.  She has chronic anxiety, some COPD, asthmatic bronchitis.  All these were well controlled.  She complained of an epigastric pain for 2 days duration, found to have significantly elevated lipases.  These trended down over 3-4 day period in the hospital.  She was given intravenous Dilaudid, clear liquid diet, n.p.o. the first 24 hours.  She had normal electrolyte balance, normal renal function.  She had no asthmatic flares, and her lipase came down to normal in the last 36 hours of hospitalization documented on 2 days.  She was subsequently discharged on the following medicines: 1. Oxycodone 5 mg, #30 one p.o. q.4 h. p.r.n. 2. Albuterol inhaler 2 puffs q.4 h. p.r.n. 3. Hydroxyzine 25 mg p.o. h.s. 4. Lorazepam 1 mg p.o. q.12 h. p.r.n. for anxiety.  The patient will follow up in the office in 1 week's time for checking amylase and clinical symptomatology.     Melvyn Novas, MD     RMD/MEDQ  D:  01/20/2012  T:  01/20/2012  Job:  [id number]

## 2012-01-20 NOTE — Discharge Summary (Signed)
[id number] 

## 2012-01-28 ENCOUNTER — Encounter (HOSPITAL_COMMUNITY): Payer: Self-pay | Admitting: *Deleted

## 2012-01-28 ENCOUNTER — Observation Stay (HOSPITAL_COMMUNITY)
Admission: EM | Admit: 2012-01-28 | Discharge: 2012-01-29 | Disposition: A | Discharge status: Home / Self Care | Payer: Self-pay | Attending: Family Medicine | Admitting: Family Medicine

## 2012-01-28 DIAGNOSIS — IMO0002 Reserved for concepts with insufficient information to code with codable children: Secondary | ICD-10-CM

## 2012-01-28 DIAGNOSIS — F17200 Nicotine dependence, unspecified, uncomplicated: Secondary | ICD-10-CM | POA: Insufficient documentation

## 2012-01-28 DIAGNOSIS — R112 Nausea with vomiting, unspecified: Secondary | ICD-10-CM | POA: Insufficient documentation

## 2012-01-28 DIAGNOSIS — K859 Acute pancreatitis without necrosis or infection, unspecified: Secondary | ICD-10-CM

## 2012-01-28 DIAGNOSIS — R109 Unspecified abdominal pain: Secondary | ICD-10-CM | POA: Insufficient documentation

## 2012-01-28 DIAGNOSIS — Z720 Tobacco use: Secondary | ICD-10-CM

## 2012-01-28 DIAGNOSIS — K861 Other chronic pancreatitis: Principal | ICD-10-CM | POA: Insufficient documentation

## 2012-01-28 DIAGNOSIS — Q453 Other congenital malformations of pancreas and pancreatic duct: Secondary | ICD-10-CM

## 2012-01-28 HISTORY — DX: Papillomavirus as the cause of diseases classified elsewhere: B97.7

## 2012-01-28 LAB — URINALYSIS, ROUTINE W REFLEX MICROSCOPIC
Bilirubin Urine: NEGATIVE
Glucose, UA: NEGATIVE mg/dL
Ketones, ur: NEGATIVE mg/dL
Leukocytes, UA: NEGATIVE
Nitrite: NEGATIVE
Specific Gravity, Urine: >1.03 — ABNORMAL HIGH (ref 1.005–1.030)
pH: 6 (ref 5.0–8.0)

## 2012-01-28 LAB — CBC WITH DIFFERENTIAL/PLATELET
Hemoglobin: 14.5 g/dL (ref 12.0–15.0)
Lymphocytes Relative: 37 % (ref 12–46)
Lymphs Abs: 3.1 10*3/uL (ref 0.7–4.0)
MCH: 29.8 pg (ref 26.0–34.0)
Monocytes Relative: 13 % — ABNORMAL HIGH (ref 3–12)
Neutrophils Relative %: 49 % (ref 43–77)
Platelets: 300 10*3/uL (ref 150–400)
RBC: 4.87 MIL/uL (ref 3.87–5.11)
WBC: 8.4 10*3/uL (ref 4.0–10.5)

## 2012-01-28 LAB — COMPREHENSIVE METABOLIC PANEL
ALT: 18 U/L (ref 0–35)
Alkaline Phosphatase: 76 U/L (ref 39–117)
BUN: 10 mg/dL (ref 6–23)
CO2: 25 mEq/L (ref 19–32)
Chloride: 98 mEq/L (ref 96–112)
GFR calc Af Amer: >90 mL/min (ref >90)
GFR calc non Af Amer: >90 mL/min (ref >90)
Glucose, Bld: 108 mg/dL — ABNORMAL HIGH (ref 70–99)
Potassium: 3.9 mEq/L (ref 3.5–5.1)
Sodium: 135 mEq/L (ref 135–145)
Total Bilirubin: 0.4 mg/dL (ref 0.3–1.2)

## 2012-01-28 LAB — LIPASE, BLOOD: Lipase: 55 U/L (ref 11–59)

## 2012-01-28 MED ORDER — ONDANSETRON HCL 4 MG/2ML IJ SOLN
4.0000 mg | Freq: Once | INTRAMUSCULAR | Status: AC
  Administered 2012-01-29: 4 mg via INTRAVENOUS
  Filled 2012-01-28: qty 2

## 2012-01-28 MED ORDER — SODIUM CHLORIDE 0.9 % IV SOLN
Freq: Once | INTRAVENOUS | Status: AC
  Administered 2012-01-29: 1000 mL via INTRAVENOUS

## 2012-01-28 MED ORDER — HYDROMORPHONE HCL PF 1 MG/ML IJ SOLN
1.0000 mg | Freq: Once | INTRAMUSCULAR | Status: AC
  Administered 2012-01-29: 1 mg via INTRAVENOUS
  Filled 2012-01-28: qty 1

## 2012-01-28 NOTE — ED Notes (Signed)
abd pain, hx of pancreatitis, recent adm for same.  Nausea, vomiting,

## 2012-01-28 NOTE — ED Notes (Signed)
Patient does not need anything at this time. 
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
## 2012-01-29 ENCOUNTER — Encounter (HOSPITAL_COMMUNITY): Payer: Self-pay | Admitting: Internal Medicine

## 2012-01-29 DIAGNOSIS — K861 Other chronic pancreatitis: Secondary | ICD-10-CM

## 2012-01-29 DIAGNOSIS — Q453 Other congenital malformations of pancreas and pancreatic duct: Secondary | ICD-10-CM

## 2012-01-29 DIAGNOSIS — F17200 Nicotine dependence, unspecified, uncomplicated: Secondary | ICD-10-CM

## 2012-01-29 LAB — COMPREHENSIVE METABOLIC PANEL
ALT: 15 U/L (ref 0–35)
Calcium: 9.4 mg/dL (ref 8.4–10.5)
Creatinine, Ser: 0.61 mg/dL (ref 0.50–1.10)
GFR calc Af Amer: >90 mL/min (ref >90)
GFR calc non Af Amer: >90 mL/min (ref >90)
Glucose, Bld: 122 mg/dL — ABNORMAL HIGH (ref 70–99)
Sodium: 137 mEq/L (ref 135–145)
Total Protein: 7.2 g/dL (ref 6.0–8.3)

## 2012-01-29 LAB — CBC
HCT: 38.9 % (ref 36.0–46.0)
Hemoglobin: 13.1 g/dL (ref 12.0–15.0)
MCHC: 33.7 g/dL (ref 30.0–36.0)
RBC: 4.46 MIL/uL (ref 3.87–5.11)

## 2012-01-29 LAB — LIPASE, BLOOD: Lipase: 34 U/L (ref 11–59)

## 2012-01-29 MED ORDER — ONDANSETRON HCL 4 MG PO TABS: 4.0000 mg | ORAL_TABLET | ORAL | Status: DC | PRN

## 2012-01-29 MED ORDER — HYDROXYZINE HCL 25 MG PO TABS: 25.0000 mg | ORAL_TABLET | Freq: Once | ORAL | Status: DC

## 2012-01-29 MED ORDER — HYDROMORPHONE HCL PF 1 MG/ML IJ SOLN
1.0000 mg | Freq: Once | INTRAMUSCULAR | Status: AC
  Administered 2012-01-29: 1 mg via INTRAVENOUS
  Filled 2012-01-29: qty 1

## 2012-01-29 MED ORDER — PAROXETINE HCL 20 MG PO TABS
20.0000 mg | ORAL_TABLET | Freq: Every day | ORAL | Status: DC
  Administered 2012-01-29: 20 mg via ORAL
  Filled 2012-01-29: qty 1

## 2012-01-29 MED ORDER — ONDANSETRON HCL 4 MG/2ML IJ SOLN: 4.0000 mg | INTRAMUSCULAR | Status: DC | PRN

## 2012-01-29 MED ORDER — OXYCODONE HCL 5 MG PO TABS: 5.0000 mg | ORAL_TABLET | Freq: Four times a day (QID) | ORAL | Status: DC | PRN

## 2012-01-29 MED ORDER — HYDROXYZINE HCL 25 MG PO TABS
25.0000 mg | ORAL_TABLET | Freq: Every day | ORAL | Status: DC
  Administered 2012-01-29: 25 mg via ORAL
  Filled 2012-01-29: qty 1

## 2012-01-29 MED ORDER — HYDROMORPHONE HCL PF 1 MG/ML IJ SOLN
1.0000 mg | INTRAMUSCULAR | Status: DC | PRN
  Administered 2012-01-29: 1 mg via INTRAVENOUS
  Filled 2012-01-29: qty 1

## 2012-01-29 MED ORDER — DEXTROSE-NACL 5-0.45 % IV SOLN
INTRAVENOUS | Status: DC
  Administered 2012-01-29: 03:00:00 via INTRAVENOUS

## 2012-01-29 MED ORDER — ACETAMINOPHEN 650 MG RE SUPP: 650.0000 mg | Freq: Four times a day (QID) | RECTAL | Status: DC | PRN

## 2012-01-29 MED ORDER — ALBUTEROL SULFATE (5 MG/ML) 0.5% IN NEBU: 2.5000 mg | INHALATION_SOLUTION | RESPIRATORY_TRACT | Status: DC | PRN

## 2012-01-29 MED ORDER — ENOXAPARIN SODIUM 40 MG/0.4ML ~~LOC~~ SOLN
40.0000 mg | SUBCUTANEOUS | Status: DC
  Filled 2012-01-29: qty 0.4

## 2012-01-29 MED ORDER — SODIUM CHLORIDE 0.9 % IJ SOLN
INTRAMUSCULAR | Status: AC
  Administered 2012-01-29: 10 mL
  Filled 2012-01-29: qty 3

## 2012-01-29 MED ORDER — ONDANSETRON HCL 4 MG PO TABS: 4.0000 mg | ORAL_TABLET | Freq: Four times a day (QID) | ORAL | Status: DC | PRN

## 2012-01-29 MED ORDER — INFLUENZA VIRUS VACC SPLIT PF IM SUSP
0.5000 mL | INTRAMUSCULAR | Status: DC
  Filled 2012-01-29: qty 0.5

## 2012-01-29 MED ORDER — ONDANSETRON HCL 4 MG/2ML IJ SOLN
4.0000 mg | Freq: Four times a day (QID) | INTRAMUSCULAR | Status: DC | PRN
  Filled 2012-01-29: qty 2

## 2012-01-29 MED ORDER — ONDANSETRON HCL 4 MG/2ML IJ SOLN
4.0000 mg | INTRAMUSCULAR | Status: DC | PRN
  Administered 2012-01-29 (×2): 4 mg via INTRAVENOUS
  Filled 2012-01-29: qty 2

## 2012-01-29 MED ORDER — PANTOPRAZOLE SODIUM 40 MG PO TBEC: 40.0000 mg | DELAYED_RELEASE_TABLET | Freq: Every day | ORAL | Status: DC

## 2012-01-29 MED ORDER — ALBUTEROL SULFATE HFA 108 (90 BASE) MCG/ACT IN AERS: 2.0000 | INHALATION_SPRAY | Freq: Four times a day (QID) | RESPIRATORY_TRACT | Status: DC | PRN

## 2012-01-29 MED ORDER — OXYCODONE HCL 5 MG PO TABS
5.0000 mg | ORAL_TABLET | ORAL | Status: DC | PRN
  Administered 2012-01-29: 5 mg via ORAL
  Filled 2012-01-29: qty 1

## 2012-01-29 MED ORDER — NICOTINE 14 MG/24HR TD PT24
14.0000 mg | MEDICATED_PATCH | Freq: Every day | TRANSDERMAL | Status: DC
  Administered 2012-01-29: 14 mg via TRANSDERMAL
  Filled 2012-01-29: qty 1

## 2012-01-29 MED ORDER — DIPHENHYDRAMINE HCL 50 MG/ML IJ SOLN
25.0000 mg | Freq: Once | INTRAMUSCULAR | Status: AC
  Administered 2012-01-29: 25 mg via INTRAVENOUS
  Filled 2012-01-29: qty 1

## 2012-01-29 MED ORDER — LORAZEPAM 1 MG PO TABS
1.0000 mg | ORAL_TABLET | Freq: Two times a day (BID) | ORAL | Status: DC | PRN
  Administered 2012-01-29: 1 mg via ORAL
  Filled 2012-01-29: qty 1

## 2012-01-29 MED ORDER — ACETAMINOPHEN 325 MG PO TABS: 650.0000 mg | ORAL_TABLET | Freq: Four times a day (QID) | ORAL | Status: DC | PRN

## 2012-01-29 NOTE — Progress Notes (Signed)
Patient wants to leave facility AMA. Risks of leaving against medical advice thoroughly discussed with patient. Patient left facility at 0830 AMA, signed paperwork in chart. Dr. Janna Arch notified.

## 2012-01-29 NOTE — Progress Notes (Signed)
[id number] 
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
## 2012-01-29 NOTE — Progress Notes (Signed)
NAME:  [name], CIFELLI NO.:  [id number]  MEDICAL RECORD NO.:  [id number]  LOCATION:  A305                          FACILITY:  [facility]  PHYSICIAN:  Melvyn Novas, MDDATE OF BIRTH:  [birth year]  DATE OF PROCEDURE: DATE OF DISCHARGE:                                PROGRESS NOTE   She has chronic and recurrent pancreatitis with pancreatic divisum.  The patient was scheduled to have an outpatient procedure at [facility].  She was discharged about a week ago with documented elevated lipases, given oxycodone #30 tablets to be discharged with.  The patient does have severe epigastric pain of 36 hours duration.  Lipases x2 are within normal limits.  They are 55 last night.  Lungs clear with prolonged expiratory phase.  She does smoke. Scattered rhonchi.  No rales, no wheeze.  Heart, regular rhythm.  No murmurs, gallops, or rubs.  No significant epigastric tenderness.  I discussed I would be cutting off the Dilaudid today and just going with oxycodone.  The patient had near panic attack in hospital concerning there may be a degree of tolerance developed with the opioid analgesia. She does have chronic anxiety.  We will add Paxil 20 mg p.o. daily.  We will add Protonix 40 mg p.o. daily today.  Continue liquid diet and monitor lipases and amylases today and daily.     Melvyn Novas, MD     RMD/MEDQ  D:  01/29/2012  T:  01/29/2012  Job:  [id number]

## 2012-02-04 ENCOUNTER — Emergency Department (HOSPITAL_COMMUNITY)
Admission: EM | Admit: 2012-02-04 | Discharge: 2012-02-05 | Disposition: A | Discharge status: Home / Self Care | Payer: Self-pay | Attending: Emergency Medicine | Admitting: Emergency Medicine

## 2012-02-04 ENCOUNTER — Encounter (HOSPITAL_COMMUNITY): Payer: Self-pay | Admitting: *Deleted

## 2012-02-04 DIAGNOSIS — F17200 Nicotine dependence, unspecified, uncomplicated: Secondary | ICD-10-CM | POA: Insufficient documentation

## 2012-02-04 DIAGNOSIS — I10 Essential (primary) hypertension: Secondary | ICD-10-CM | POA: Insufficient documentation

## 2012-02-04 DIAGNOSIS — R112 Nausea with vomiting, unspecified: Secondary | ICD-10-CM | POA: Insufficient documentation

## 2012-02-04 DIAGNOSIS — R109 Unspecified abdominal pain: Secondary | ICD-10-CM | POA: Insufficient documentation

## 2012-02-04 MED ORDER — SODIUM CHLORIDE 0.9 % IV SOLN
Freq: Once | INTRAVENOUS | Status: AC
  Administered 2012-02-05: via INTRAVENOUS

## 2012-02-04 MED ORDER — ONDANSETRON HCL 4 MG/2ML IJ SOLN
4.0000 mg | Freq: Once | INTRAMUSCULAR | Status: AC
  Administered 2012-02-05: 4 mg via INTRAVENOUS
  Filled 2012-02-04: qty 2

## 2012-02-04 MED ORDER — HYDROMORPHONE HCL PF 1 MG/ML IJ SOLN
1.0000 mg | Freq: Once | INTRAMUSCULAR | Status: AC
  Administered 2012-02-05: 1 mg via INTRAVENOUS
  Filled 2012-02-04: qty 1

## 2012-02-04 MED ORDER — DIPHENHYDRAMINE HCL 50 MG/ML IJ SOLN
25.0000 mg | Freq: Once | INTRAMUSCULAR | Status: AC
  Administered 2012-02-05: 25 mg via INTRAVENOUS
  Filled 2012-02-04: qty 1

## 2012-02-04 NOTE — ED Notes (Signed)
abd and chest pain, "pancreatitis flare"
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
## 2012-02-04 NOTE — ED Notes (Signed)
Pt notes history of pacnreatitis, states she has flare ups, says she is scheduled to have a stent placed in the near future at [facility] but in the meantime the pain has gotten severe. Pain to mid abdomen and states it radiates up to her chest.

## 2012-02-05 LAB — CBC WITH DIFFERENTIAL/PLATELET
Basophils Absolute: 0 10*3/uL (ref 0.0–0.1)
Eosinophils Absolute: 0.3 10*3/uL (ref 0.0–0.7)
Eosinophils Relative: 4 % (ref 0–5)
MCH: 29.6 pg (ref 26.0–34.0)
MCV: 87.4 fL (ref 78.0–100.0)
Platelets: 322 10*3/uL (ref 150–400)
RDW: 15.6 % — ABNORMAL HIGH (ref 11.5–15.5)

## 2012-02-05 LAB — COMPREHENSIVE METABOLIC PANEL
ALT: 13 U/L (ref 0–35)
AST: 15 U/L (ref 0–37)
Alkaline Phosphatase: 66 U/L (ref 39–117)
CO2: 28 mEq/L (ref 19–32)
Chloride: 107 mEq/L (ref 96–112)
GFR calc non Af Amer: >90 mL/min (ref >90)
Sodium: 144 mEq/L (ref 135–145)
Total Bilirubin: 0.2 mg/dL — ABNORMAL LOW (ref 0.3–1.2)

## 2012-02-05 MED ORDER — ONDANSETRON HCL 4 MG/2ML IJ SOLN
4.0000 mg | Freq: Once | INTRAMUSCULAR | Status: AC
  Administered 2012-02-05: 4 mg via INTRAVENOUS
  Filled 2012-02-05: qty 2

## 2012-02-05 MED ORDER — HYDROMORPHONE HCL PF 1 MG/ML IJ SOLN
1.0000 mg | Freq: Once | INTRAMUSCULAR | Status: AC
  Administered 2012-02-05: 1 mg via INTRAVENOUS
  Filled 2012-02-05: qty 1

## 2012-02-05 MED ORDER — PROMETHAZINE HCL 25 MG PO TABS: 12.5000 mg | ORAL_TABLET | Freq: Four times a day (QID) | ORAL | Status: DC | PRN

## 2012-02-05 MED ORDER — OXYCODONE HCL 5 MG PO TABS: 5.0000 mg | ORAL_TABLET | ORAL | Status: DC | PRN

## 2012-02-05 NOTE — ED Notes (Signed)
Pt notes increase in pain to 9/10, dr strand notified

## 2012-02-06 ENCOUNTER — Encounter (HOSPITAL_COMMUNITY): Payer: Self-pay | Admitting: Emergency Medicine

## 2012-02-06 ENCOUNTER — Emergency Department (HOSPITAL_COMMUNITY)
Admission: EM | Admit: 2012-02-06 | Discharge: 2012-02-06 | Disposition: A | Discharge status: Home / Self Care | Payer: Self-pay | Attending: Emergency Medicine | Admitting: Emergency Medicine

## 2012-02-06 DIAGNOSIS — R509 Fever, unspecified: Secondary | ICD-10-CM | POA: Insufficient documentation

## 2012-02-06 DIAGNOSIS — R109 Unspecified abdominal pain: Secondary | ICD-10-CM | POA: Insufficient documentation

## 2012-02-06 DIAGNOSIS — K859 Acute pancreatitis without necrosis or infection, unspecified: Secondary | ICD-10-CM | POA: Insufficient documentation

## 2012-02-06 LAB — CBC WITH DIFFERENTIAL/PLATELET
Basophils Relative: 0 % (ref 0–1)
Eosinophils Absolute: 0.3 10*3/uL (ref 0.0–0.7)
Eosinophils Relative: 3 % (ref 0–5)
Hemoglobin: 15.4 g/dL — ABNORMAL HIGH (ref 12.0–15.0)
MCH: 30.4 pg (ref 26.0–34.0)
MCHC: 34.5 g/dL (ref 30.0–36.0)
MCV: 88.2 fL (ref 78.0–100.0)
Monocytes Relative: 7 % (ref 3–12)
Neutrophils Relative %: 65 % (ref 43–77)
Platelets: 426 10*3/uL — ABNORMAL HIGH (ref 150–400)

## 2012-02-06 LAB — COMPREHENSIVE METABOLIC PANEL
Albumin: 5 g/dL (ref 3.5–5.2)
Alkaline Phosphatase: 88 U/L (ref 39–117)
BUN: 7 mg/dL (ref 6–23)
Calcium: 10.4 mg/dL (ref 8.4–10.5)
GFR calc Af Amer: >90 mL/min (ref >90)
Potassium: 3.3 mEq/L — ABNORMAL LOW (ref 3.5–5.1)
Total Protein: 8.6 g/dL — ABNORMAL HIGH (ref 6.0–8.3)

## 2012-02-06 LAB — URINALYSIS, ROUTINE W REFLEX MICROSCOPIC
Glucose, UA: NEGATIVE mg/dL
Ketones, ur: NEGATIVE mg/dL
Leukocytes, UA: NEGATIVE
Nitrite: NEGATIVE
Specific Gravity, Urine: 1.02 (ref 1.005–1.030)
pH: 8 (ref 5.0–8.0)

## 2012-02-06 LAB — AMYLASE: Amylase: 111 U/L — ABNORMAL HIGH (ref 0–105)

## 2012-02-06 MED ORDER — PROMETHAZINE HCL 25 MG RE SUPP: 25.0000 mg | Freq: Four times a day (QID) | RECTAL | Status: DC | PRN

## 2012-02-06 MED ORDER — ONDANSETRON HCL 4 MG/2ML IJ SOLN
4.0000 mg | Freq: Once | INTRAMUSCULAR | Status: AC
  Administered 2012-02-06: 4 mg via INTRAVENOUS
  Filled 2012-02-06: qty 2

## 2012-02-06 MED ORDER — OXYCODONE HCL 5 MG PO TABS: 5.0000 mg | ORAL_TABLET | ORAL | Status: DC | PRN

## 2012-02-06 MED ORDER — DIPHENHYDRAMINE HCL 25 MG PO CAPS
25.0000 mg | ORAL_CAPSULE | Freq: Once | ORAL | Status: AC
  Administered 2012-02-06: 25 mg via ORAL
  Filled 2012-02-06: qty 1

## 2012-02-06 MED ORDER — SODIUM CHLORIDE 0.9 % IV BOLUS (SEPSIS)
1000.0000 mL | Freq: Once | INTRAVENOUS | Status: AC
  Administered 2012-02-06: 1000 mL via INTRAVENOUS

## 2012-02-06 MED ORDER — HYDROMORPHONE HCL PF 1 MG/ML IJ SOLN
1.0000 mg | Freq: Once | INTRAMUSCULAR | Status: AC
  Administered 2012-02-06: 1 mg via INTRAVENOUS
  Filled 2012-02-06: qty 1

## 2012-02-06 MED ORDER — OXYCODONE-ACETAMINOPHEN 5-325 MG PO TABS: 1.0000 | ORAL_TABLET | ORAL | Status: DC | PRN

## 2012-02-06 MED ORDER — SODIUM CHLORIDE 0.9 % IV SOLN
1000.0000 mL | Freq: Once | INTRAVENOUS | Status: AC
  Administered 2012-02-06: 1000 mL via INTRAVENOUS
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
## 2012-02-06 NOTE — ED Notes (Signed)
Pt drinking sprite per fluid push

## 2012-02-06 NOTE — ED Notes (Signed)
Discharge instructions reviewed with pt, questions answered. Pt verbalized understanding.  

## 2012-02-06 NOTE — ED Notes (Signed)
Pt actively vomiting, will medicate per protocol

## 2012-02-06 NOTE — ED Notes (Signed)
Pt c/o abd pain with n/v. Pt was seen here for the same Friday night.

## 2012-02-10 ENCOUNTER — Emergency Department (HOSPITAL_COMMUNITY)
Admission: EM | Admit: 2012-02-10 | Discharge: 2012-02-10 | Disposition: A | Discharge status: Home / Self Care | Payer: Self-pay | Attending: Emergency Medicine | Admitting: Emergency Medicine

## 2012-02-10 ENCOUNTER — Encounter (HOSPITAL_COMMUNITY): Payer: Self-pay | Admitting: Emergency Medicine

## 2012-02-10 DIAGNOSIS — R87810 Cervical high risk human papillomavirus (HPV) DNA test positive: Secondary | ICD-10-CM | POA: Insufficient documentation

## 2012-02-10 DIAGNOSIS — R1013 Epigastric pain: Secondary | ICD-10-CM | POA: Insufficient documentation

## 2012-02-10 DIAGNOSIS — Z79899 Other long term (current) drug therapy: Secondary | ICD-10-CM | POA: Insufficient documentation

## 2012-02-10 DIAGNOSIS — F3289 Other specified depressive episodes: Secondary | ICD-10-CM | POA: Insufficient documentation

## 2012-02-10 DIAGNOSIS — F17200 Nicotine dependence, unspecified, uncomplicated: Secondary | ICD-10-CM | POA: Insufficient documentation

## 2012-02-10 DIAGNOSIS — F329 Major depressive disorder, single episode, unspecified: Secondary | ICD-10-CM | POA: Insufficient documentation

## 2012-02-10 DIAGNOSIS — Z8781 Personal history of (healed) traumatic fracture: Secondary | ICD-10-CM | POA: Insufficient documentation

## 2012-02-10 DIAGNOSIS — F411 Generalized anxiety disorder: Secondary | ICD-10-CM | POA: Insufficient documentation

## 2012-02-10 DIAGNOSIS — Z8719 Personal history of other diseases of the digestive system: Secondary | ICD-10-CM | POA: Insufficient documentation

## 2012-02-10 DIAGNOSIS — Z9889 Other specified postprocedural states: Secondary | ICD-10-CM | POA: Insufficient documentation

## 2012-02-10 DIAGNOSIS — F1210 Cannabis abuse, uncomplicated: Secondary | ICD-10-CM | POA: Insufficient documentation

## 2012-02-10 DIAGNOSIS — Z8619 Personal history of other infectious and parasitic diseases: Secondary | ICD-10-CM | POA: Insufficient documentation

## 2012-02-10 DIAGNOSIS — R109 Unspecified abdominal pain: Secondary | ICD-10-CM

## 2012-02-10 DIAGNOSIS — I10 Essential (primary) hypertension: Secondary | ICD-10-CM | POA: Insufficient documentation

## 2012-02-10 DIAGNOSIS — Q453 Other congenital malformations of pancreas and pancreatic duct: Secondary | ICD-10-CM | POA: Insufficient documentation

## 2012-02-10 MED ORDER — METOCLOPRAMIDE HCL 5 MG/ML IJ SOLN
10.0000 mg | Freq: Once | INTRAMUSCULAR | Status: AC
  Administered 2012-02-10: 10 mg via INTRAVENOUS
  Filled 2012-02-10: qty 2

## 2012-02-10 MED ORDER — HYDROMORPHONE HCL PF 1 MG/ML IJ SOLN
1.0000 mg | Freq: Once | INTRAMUSCULAR | Status: AC
  Administered 2012-02-10: 1 mg via INTRAVENOUS
  Filled 2012-02-10: qty 1

## 2012-02-10 MED ORDER — SODIUM CHLORIDE 0.9 % IV SOLN: Freq: Once | INTRAVENOUS | Status: DC

## 2012-02-10 MED ORDER — SODIUM CHLORIDE 0.9 % IV BOLUS (SEPSIS)
1000.0000 mL | Freq: Once | INTRAVENOUS | Status: AC
  Administered 2012-02-10: 1000 mL via INTRAVENOUS

## 2012-02-10 MED ORDER — DIPHENHYDRAMINE HCL 50 MG/ML IJ SOLN: 25.0000 mg | Freq: Once | INTRAMUSCULAR | Status: DC

## 2012-02-10 MED ORDER — ONDANSETRON HCL 4 MG/2ML IJ SOLN
4.0000 mg | Freq: Once | INTRAMUSCULAR | Status: AC
  Administered 2012-02-10: 4 mg via INTRAVENOUS
  Filled 2012-02-10: qty 2

## 2012-02-10 MED ORDER — PANTOPRAZOLE SODIUM 40 MG IV SOLR
40.0000 mg | INTRAVENOUS | Status: DC
  Filled 2012-02-10: qty 40

## 2012-02-10 MED ORDER — DIPHENHYDRAMINE HCL 50 MG/ML IJ SOLN
25.0000 mg | Freq: Once | INTRAMUSCULAR | Status: AC
  Administered 2012-02-10: 25 mg via INTRAVENOUS
  Filled 2012-02-10: qty 1
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
## 2012-02-10 NOTE — ED Notes (Signed)
Awoke with sudden abdominal pain and vomiting.  Feels like previous pain with pancreatitis

## 2012-02-11 ENCOUNTER — Encounter (HOSPITAL_COMMUNITY): Payer: Self-pay | Admitting: *Deleted

## 2012-02-11 ENCOUNTER — Emergency Department (HOSPITAL_COMMUNITY)
Admission: EM | Admit: 2012-02-11 | Discharge: 2012-02-12 | Disposition: A | Discharge status: Home / Self Care | Payer: Self-pay | Attending: Emergency Medicine | Admitting: Emergency Medicine

## 2012-02-11 DIAGNOSIS — F411 Generalized anxiety disorder: Secondary | ICD-10-CM | POA: Insufficient documentation

## 2012-02-11 DIAGNOSIS — Z9889 Other specified postprocedural states: Secondary | ICD-10-CM | POA: Insufficient documentation

## 2012-02-11 DIAGNOSIS — R109 Unspecified abdominal pain: Secondary | ICD-10-CM

## 2012-02-11 DIAGNOSIS — K859 Acute pancreatitis without necrosis or infection, unspecified: Secondary | ICD-10-CM | POA: Insufficient documentation

## 2012-02-11 DIAGNOSIS — F329 Major depressive disorder, single episode, unspecified: Secondary | ICD-10-CM | POA: Insufficient documentation

## 2012-02-11 DIAGNOSIS — I10 Essential (primary) hypertension: Secondary | ICD-10-CM | POA: Insufficient documentation

## 2012-02-11 DIAGNOSIS — R87810 Cervical high risk human papillomavirus (HPV) DNA test positive: Secondary | ICD-10-CM | POA: Insufficient documentation

## 2012-02-11 DIAGNOSIS — F17200 Nicotine dependence, unspecified, uncomplicated: Secondary | ICD-10-CM | POA: Insufficient documentation

## 2012-02-11 DIAGNOSIS — F1210 Cannabis abuse, uncomplicated: Secondary | ICD-10-CM | POA: Insufficient documentation

## 2012-02-11 DIAGNOSIS — G4730 Sleep apnea, unspecified: Secondary | ICD-10-CM | POA: Insufficient documentation

## 2012-02-11 DIAGNOSIS — R1013 Epigastric pain: Secondary | ICD-10-CM | POA: Insufficient documentation

## 2012-02-11 DIAGNOSIS — R112 Nausea with vomiting, unspecified: Secondary | ICD-10-CM | POA: Insufficient documentation

## 2012-02-11 DIAGNOSIS — F3289 Other specified depressive episodes: Secondary | ICD-10-CM | POA: Insufficient documentation

## 2012-02-11 MED ORDER — ONDANSETRON 8 MG PO TBDP
8.0000 mg | ORAL_TABLET | Freq: Once | ORAL | Status: AC
  Administered 2012-02-12: 8 mg via ORAL
  Filled 2012-02-11: qty 1

## 2012-02-11 MED ORDER — OXYCODONE-ACETAMINOPHEN 5-325 MG PO TABS
2.0000 | ORAL_TABLET | Freq: Once | ORAL | Status: DC
  Filled 2012-02-11: qty 2

## 2012-02-11 NOTE — ED Notes (Signed)
Pt still having flare up from previous ER visit, unable to afford phenergan suppositories from previous encounter, nauseated, emesis x1 today

## 2012-02-12 ENCOUNTER — Encounter (HOSPITAL_COMMUNITY): Payer: Self-pay | Admitting: *Deleted

## 2012-02-12 ENCOUNTER — Emergency Department (HOSPITAL_COMMUNITY)
Admission: EM | Admit: 2012-02-12 | Discharge: 2012-02-12 | Disposition: A | Discharge status: Home / Self Care | Payer: Self-pay | Attending: Emergency Medicine | Admitting: Emergency Medicine

## 2012-02-12 DIAGNOSIS — R112 Nausea with vomiting, unspecified: Secondary | ICD-10-CM | POA: Insufficient documentation

## 2012-02-12 DIAGNOSIS — I10 Essential (primary) hypertension: Secondary | ICD-10-CM | POA: Insufficient documentation

## 2012-02-12 DIAGNOSIS — Z8719 Personal history of other diseases of the digestive system: Secondary | ICD-10-CM | POA: Insufficient documentation

## 2012-02-12 DIAGNOSIS — F3289 Other specified depressive episodes: Secondary | ICD-10-CM | POA: Insufficient documentation

## 2012-02-12 DIAGNOSIS — R0602 Shortness of breath: Secondary | ICD-10-CM | POA: Insufficient documentation

## 2012-02-12 DIAGNOSIS — Z8669 Personal history of other diseases of the nervous system and sense organs: Secondary | ICD-10-CM | POA: Insufficient documentation

## 2012-02-12 DIAGNOSIS — F329 Major depressive disorder, single episode, unspecified: Secondary | ICD-10-CM | POA: Insufficient documentation

## 2012-02-12 DIAGNOSIS — F17200 Nicotine dependence, unspecified, uncomplicated: Secondary | ICD-10-CM | POA: Insufficient documentation

## 2012-02-12 DIAGNOSIS — F411 Generalized anxiety disorder: Secondary | ICD-10-CM | POA: Insufficient documentation

## 2012-02-12 DIAGNOSIS — Z8619 Personal history of other infectious and parasitic diseases: Secondary | ICD-10-CM | POA: Insufficient documentation

## 2012-02-12 DIAGNOSIS — Z79899 Other long term (current) drug therapy: Secondary | ICD-10-CM | POA: Insufficient documentation

## 2012-02-12 DIAGNOSIS — R109 Unspecified abdominal pain: Secondary | ICD-10-CM | POA: Insufficient documentation

## 2012-02-12 DIAGNOSIS — M869 Osteomyelitis, unspecified: Secondary | ICD-10-CM | POA: Insufficient documentation

## 2012-02-12 LAB — CBC WITH DIFFERENTIAL/PLATELET
Basophils Absolute: 0 10*3/uL (ref 0.0–0.1)
Basophils Relative: 0 % (ref 0–1)
HCT: 40.5 % (ref 36.0–46.0)
Lymphocytes Relative: 16 % (ref 12–46)
Lymphocytes Relative: 35 % (ref 12–46)
Lymphs Abs: 2.4 10*3/uL (ref 0.7–4.0)
MCHC: 34.6 g/dL (ref 30.0–36.0)
Monocytes Absolute: 1.7 10*3/uL — ABNORMAL HIGH (ref 0.1–1.0)
Neutro Abs: 14.6 10*3/uL — ABNORMAL HIGH (ref 1.7–7.7)
Neutro Abs: 3.6 10*3/uL (ref 1.7–7.7)
Neutrophils Relative %: 54 % (ref 43–77)
Neutrophils Relative %: 74 % (ref 43–77)
Platelets: 317 10*3/uL (ref 150–400)
Platelets: 387 10*3/uL (ref 150–400)
RBC: 4.8 MIL/uL (ref 3.87–5.11)
RDW: 16.3 % — ABNORMAL HIGH (ref 11.5–15.5)
WBC: 19.7 10*3/uL — ABNORMAL HIGH (ref 4.0–10.5)
WBC: 6.8 10*3/uL (ref 4.0–10.5)

## 2012-02-12 LAB — LIPASE, BLOOD: Lipase: 37 U/L (ref 11–59)

## 2012-02-12 LAB — AMYLASE: Amylase: 77 U/L (ref 0–105)

## 2012-02-12 LAB — COMPREHENSIVE METABOLIC PANEL
ALT: 16 U/L (ref 0–35)
ALT: 17 U/L (ref 0–35)
AST: 16 U/L (ref 0–37)
AST: 16 U/L (ref 0–37)
Albumin: 4.1 g/dL (ref 3.5–5.2)
Albumin: 4.8 g/dL (ref 3.5–5.2)
Alkaline Phosphatase: 85 U/L (ref 39–117)
BUN: 5 mg/dL — ABNORMAL LOW (ref 6–23)
CO2: 25 mEq/L (ref 19–32)
Calcium: 10.2 mg/dL (ref 8.4–10.5)
Chloride: 102 mEq/L (ref 96–112)
Chloride: 98 mEq/L (ref 96–112)
Creatinine, Ser: 0.58 mg/dL (ref 0.50–1.10)
Creatinine, Ser: 0.63 mg/dL (ref 0.50–1.10)
GFR calc Af Amer: >90 mL/min (ref >90)
GFR calc non Af Amer: >90 mL/min (ref >90)
Glucose, Bld: 94 mg/dL (ref 70–99)
Potassium: 3.6 mEq/L (ref 3.5–5.1)
Sodium: 136 mEq/L (ref 135–145)
Sodium: 140 mEq/L (ref 135–145)
Total Bilirubin: 0.2 mg/dL — ABNORMAL LOW (ref 0.3–1.2)
Total Bilirubin: 0.3 mg/dL (ref 0.3–1.2)
Total Protein: 8.4 g/dL — ABNORMAL HIGH (ref 6.0–8.3)

## 2012-02-12 LAB — URINALYSIS, ROUTINE W REFLEX MICROSCOPIC
Hgb urine dipstick: NEGATIVE
Nitrite: NEGATIVE
Specific Gravity, Urine: >1.03 — ABNORMAL HIGH (ref 1.005–1.030)
Urobilinogen, UA: 0.2 mg/dL (ref 0.0–1.0)
pH: 6.5 (ref 5.0–8.0)

## 2012-02-12 LAB — POCT PREGNANCY, URINE: Preg Test, Ur: NEGATIVE

## 2012-02-12 MED ORDER — HYDROMORPHONE HCL PF 1 MG/ML IJ SOLN
1.0000 mg | Freq: Once | INTRAMUSCULAR | Status: AC
  Administered 2012-02-12: 1 mg via INTRAVENOUS
  Filled 2012-02-12: qty 1

## 2012-02-12 MED ORDER — ONDANSETRON HCL 4 MG/2ML IJ SOLN
4.0000 mg | Freq: Once | INTRAMUSCULAR | Status: AC
  Administered 2012-02-12: 4 mg via INTRAVENOUS
  Filled 2012-02-12: qty 2

## 2012-02-12 MED ORDER — OXYCODONE HCL 5 MG PO TABS
10.0000 mg | ORAL_TABLET | Freq: Once | ORAL | Status: AC
  Administered 2012-02-12: 10 mg via ORAL
  Filled 2012-02-12: qty 2

## 2012-02-12 MED ORDER — PROMETHAZINE HCL 25 MG PO TABS: 25.0000 mg | ORAL_TABLET | Freq: Four times a day (QID) | ORAL | Status: DC | PRN

## 2012-02-12 MED ORDER — SODIUM CHLORIDE 0.9 % IV BOLUS (SEPSIS)
1000.0000 mL | Freq: Once | INTRAVENOUS | Status: AC
  Administered 2012-02-12: 1000 mL via INTRAVENOUS

## 2012-02-12 MED ORDER — DIPHENHYDRAMINE HCL 50 MG/ML IJ SOLN
25.0000 mg | Freq: Once | INTRAMUSCULAR | Status: AC
  Administered 2012-02-12: 25 mg via INTRAVENOUS
  Filled 2012-02-12: qty 1

## 2012-02-12 MED ORDER — OXYCODONE HCL 5 MG PO TABS: 5.0000 mg | ORAL_TABLET | Freq: Four times a day (QID) | ORAL | Status: DC | PRN

## 2012-02-12 MED ORDER — SODIUM CHLORIDE 0.9 % IV SOLN
Freq: Once | INTRAVENOUS | Status: AC
  Administered 2012-02-12: 01:00:00 via INTRAVENOUS

## 2012-02-12 MED ORDER — DIPHENHYDRAMINE HCL 25 MG PO CAPS
25.0000 mg | ORAL_CAPSULE | Freq: Once | ORAL | Status: AC
  Administered 2012-02-12: 25 mg via ORAL
  Filled 2012-02-12: qty 1

## 2012-02-12 NOTE — ED Notes (Signed)
Seen earlier this am and dx with chronic pancreatitis.  Reports was told to come back for increased pain and persistent vomiting.  States started vomiting again x 2 hrs ago and worsening pain.
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
## 2012-02-12 NOTE — ED Notes (Signed)
Pt request benadryl. States when she takes dilaudid it sometimes makes her itch. edp aware

## 2012-02-12 NOTE — ED Notes (Signed)
Pt alert & oriented x4, stable gait. Patient given discharge instructions, paperwork & prescription(s). Patient  instructed to stop at the registration desk to finish any additional paperwork. Patient verbalized understanding. Pt left department w/ no further questions. 
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
## 2012-02-14 ENCOUNTER — Emergency Department (HOSPITAL_COMMUNITY)
Admission: EM | Admit: 2012-02-14 | Discharge: 2012-02-15 | Disposition: A | Discharge status: Home / Self Care | Payer: Self-pay | Attending: Emergency Medicine | Admitting: Emergency Medicine

## 2012-02-14 ENCOUNTER — Encounter (HOSPITAL_COMMUNITY): Payer: Self-pay | Admitting: Cardiology

## 2012-02-14 DIAGNOSIS — F411 Generalized anxiety disorder: Secondary | ICD-10-CM | POA: Insufficient documentation

## 2012-02-14 DIAGNOSIS — M869 Osteomyelitis, unspecified: Secondary | ICD-10-CM | POA: Insufficient documentation

## 2012-02-14 DIAGNOSIS — Z8619 Personal history of other infectious and parasitic diseases: Secondary | ICD-10-CM | POA: Insufficient documentation

## 2012-02-14 DIAGNOSIS — K859 Acute pancreatitis without necrosis or infection, unspecified: Secondary | ICD-10-CM | POA: Insufficient documentation

## 2012-02-14 DIAGNOSIS — F329 Major depressive disorder, single episode, unspecified: Secondary | ICD-10-CM | POA: Insufficient documentation

## 2012-02-14 DIAGNOSIS — G8929 Other chronic pain: Secondary | ICD-10-CM | POA: Insufficient documentation

## 2012-02-14 DIAGNOSIS — G4730 Sleep apnea, unspecified: Secondary | ICD-10-CM | POA: Insufficient documentation

## 2012-02-14 DIAGNOSIS — R1013 Epigastric pain: Secondary | ICD-10-CM | POA: Insufficient documentation

## 2012-02-14 DIAGNOSIS — F3289 Other specified depressive episodes: Secondary | ICD-10-CM | POA: Insufficient documentation

## 2012-02-14 DIAGNOSIS — R1110 Vomiting, unspecified: Secondary | ICD-10-CM | POA: Insufficient documentation

## 2012-02-14 DIAGNOSIS — Z79899 Other long term (current) drug therapy: Secondary | ICD-10-CM | POA: Insufficient documentation

## 2012-02-14 DIAGNOSIS — I10 Essential (primary) hypertension: Secondary | ICD-10-CM | POA: Insufficient documentation

## 2012-02-14 DIAGNOSIS — F17200 Nicotine dependence, unspecified, uncomplicated: Secondary | ICD-10-CM | POA: Insufficient documentation

## 2012-02-14 LAB — URINALYSIS, ROUTINE W REFLEX MICROSCOPIC
Bilirubin Urine: NEGATIVE
Glucose, UA: NEGATIVE mg/dL
Ketones, ur: NEGATIVE mg/dL
Leukocytes, UA: NEGATIVE
Protein, ur: NEGATIVE mg/dL

## 2012-02-14 LAB — CBC WITH DIFFERENTIAL/PLATELET
Basophils Absolute: 0 10*3/uL (ref 0.0–0.1)
Eosinophils Absolute: 0.2 10*3/uL (ref 0.0–0.7)
Eosinophils Relative: 2 % (ref 0–5)
MCH: 29.9 pg (ref 26.0–34.0)
MCV: 86.6 fL (ref 78.0–100.0)
Monocytes Absolute: 0.9 10*3/uL (ref 0.1–1.0)
Platelets: 401 10*3/uL — ABNORMAL HIGH (ref 150–400)
RDW: 16 % — ABNORMAL HIGH (ref 11.5–15.5)

## 2012-02-14 LAB — BASIC METABOLIC PANEL
Calcium: 10.4 mg/dL (ref 8.4–10.5)
Creatinine, Ser: 0.59 mg/dL (ref 0.50–1.10)
GFR calc non Af Amer: >90 mL/min (ref >90)
Glucose, Bld: 107 mg/dL — ABNORMAL HIGH (ref 70–99)
Sodium: 139 mEq/L (ref 135–145)

## 2012-02-14 LAB — LIPASE, BLOOD: Lipase: 35 U/L (ref 11–59)

## 2012-02-14 MED ORDER — SODIUM CHLORIDE 0.9 % IV BOLUS (SEPSIS)
1000.0000 mL | Freq: Once | INTRAVENOUS | Status: AC
  Administered 2012-02-14: 1000 mL via INTRAVENOUS

## 2012-02-14 MED ORDER — GI COCKTAIL ~~LOC~~
30.0000 mL | Freq: Once | ORAL | Status: AC
  Administered 2012-02-14: 30 mL via ORAL
  Filled 2012-02-14: qty 30

## 2012-02-14 MED ORDER — HYDROMORPHONE HCL PF 1 MG/ML IJ SOLN
1.0000 mg | Freq: Once | INTRAMUSCULAR | Status: AC
  Administered 2012-02-14: 1 mg via INTRAVENOUS
  Filled 2012-02-14: qty 1

## 2012-02-14 MED ORDER — PANTOPRAZOLE SODIUM 40 MG IV SOLR
40.0000 mg | Freq: Once | INTRAVENOUS | Status: AC
  Administered 2012-02-14: 40 mg via INTRAVENOUS
  Filled 2012-02-14: qty 40

## 2012-02-14 MED ORDER — ONDANSETRON HCL 4 MG/2ML IJ SOLN
4.0000 mg | Freq: Once | INTRAMUSCULAR | Status: AC
  Administered 2012-02-14: 4 mg via INTRAVENOUS
  Filled 2012-02-14: qty 2

## 2012-02-14 MED ORDER — PROMETHAZINE HCL 25 MG/ML IJ SOLN
25.0000 mg | Freq: Once | INTRAMUSCULAR | Status: AC
  Administered 2012-02-14: 25 mg via INTRAVENOUS
  Filled 2012-02-14: qty 1

## 2012-02-14 MED ORDER — DIPHENHYDRAMINE HCL 50 MG/ML IJ SOLN
12.5000 mg | Freq: Once | INTRAMUSCULAR | Status: AC
  Administered 2012-02-14: 12.5 mg via INTRAVENOUS
  Filled 2012-02-14: qty 1

## 2012-02-14 NOTE — ED Notes (Signed)
Patient currently sitting up in bed; no respiratory or acute distress noted.  Patient updated on plan of care; informed patient that we are currently waiting on further orders from EDP/PA.  Patient has no other questions or concerns at this time; will continue to monitor.

## 2012-02-14 NOTE — ED Notes (Signed)
Reports she was recently in the hospital for pancreatis, states that she started having n/v again around 1100 this morning. Reports chest pain and SOB with symptoms.
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
## 2012-02-14 NOTE — ED Notes (Addendum)
Patient ambulated to restroom with no difficulties; currently sitting up in bed; no respiratory or acute distress noted.  Patient updated on plan of care; informed patient that we are currently waiting on further orders from EDP/PA.  Patient has no other questions or concerns at this time; will continue to monitor.

## 2012-02-15 MED ORDER — PROMETHAZINE HCL 25 MG RE SUPP: 25.0000 mg | Freq: Four times a day (QID) | RECTAL | Status: DC | PRN

## 2012-02-15 MED ORDER — PROMETHAZINE HCL 25 MG PO TABS: 25.0000 mg | ORAL_TABLET | Freq: Four times a day (QID) | ORAL | Status: DC | PRN

## 2012-02-15 MED ORDER — OXYCODONE HCL 5 MG PO TABS: 5.0000 mg | ORAL_TABLET | ORAL | Status: DC | PRN

## 2012-02-15 NOTE — ED Provider Notes (Signed)
Medical screening examination/treatment/procedure(s) were performed by non-physician practitioner and as supervising physician I was immediately available for consultation/collaboration.  Cheri Guppy, MD 02/15/12 [phone]

## 2012-02-15 NOTE — ED Notes (Signed)
Patient given copy of discharge paperwork; went over discharge instructions with patient.  Patient instructed to take phenergan suppositories and oxycodone as directed, to not drive or drink while taking oxycodone, to follow up with her GI specialist, and to return to the ED for new, worsening, or concerning symptoms.

## 2012-02-17 ENCOUNTER — Encounter (HOSPITAL_COMMUNITY): Payer: Self-pay | Admitting: *Deleted

## 2012-02-17 ENCOUNTER — Observation Stay (HOSPITAL_COMMUNITY)
Admission: EM | Admit: 2012-02-17 | Discharge: 2012-02-19 | Disposition: A | Discharge status: Home / Self Care | Payer: Self-pay | Attending: Internal Medicine | Admitting: Internal Medicine

## 2012-02-17 DIAGNOSIS — I10 Essential (primary) hypertension: Secondary | ICD-10-CM | POA: Insufficient documentation

## 2012-02-17 DIAGNOSIS — IMO0002 Reserved for concepts with insufficient information to code with codable children: Secondary | ICD-10-CM | POA: Diagnosis present

## 2012-02-17 DIAGNOSIS — R112 Nausea with vomiting, unspecified: Secondary | ICD-10-CM | POA: Insufficient documentation

## 2012-02-17 DIAGNOSIS — D649 Anemia, unspecified: Secondary | ICD-10-CM

## 2012-02-17 DIAGNOSIS — Z720 Tobacco use: Secondary | ICD-10-CM | POA: Diagnosis present

## 2012-02-17 DIAGNOSIS — D72829 Elevated white blood cell count, unspecified: Secondary | ICD-10-CM | POA: Insufficient documentation

## 2012-02-17 DIAGNOSIS — F17200 Nicotine dependence, unspecified, uncomplicated: Secondary | ICD-10-CM | POA: Insufficient documentation

## 2012-02-17 DIAGNOSIS — E876 Hypokalemia: Secondary | ICD-10-CM | POA: Diagnosis not present

## 2012-02-17 DIAGNOSIS — Q453 Other congenital malformations of pancreas and pancreatic duct: Secondary | ICD-10-CM

## 2012-02-17 DIAGNOSIS — R109 Unspecified abdominal pain: Secondary | ICD-10-CM | POA: Diagnosis present

## 2012-02-17 DIAGNOSIS — K859 Acute pancreatitis without necrosis or infection, unspecified: Principal | ICD-10-CM | POA: Insufficient documentation

## 2012-02-17 HISTORY — DX: Gastro-esophageal reflux disease without esophagitis: K21.9

## 2012-02-17 LAB — COMPREHENSIVE METABOLIC PANEL
Albumin: 4 g/dL (ref 3.5–5.2)
BUN: 14 mg/dL (ref 6–23)
Calcium: 10 mg/dL (ref 8.4–10.5)
Creatinine, Ser: 0.59 mg/dL (ref 0.50–1.10)
GFR calc Af Amer: >90 mL/min (ref >90)
Glucose, Bld: 127 mg/dL — ABNORMAL HIGH (ref 70–99)
Potassium: 3.5 mEq/L (ref 3.5–5.1)
Total Protein: 7.4 g/dL (ref 6.0–8.3)

## 2012-02-17 LAB — CBC WITH DIFFERENTIAL/PLATELET
Basophils Relative: 0 % (ref 0–1)
Eosinophils Absolute: 0.3 10*3/uL (ref 0.0–0.7)
Eosinophils Relative: 3 % (ref 0–5)
Hemoglobin: 13.4 g/dL (ref 12.0–15.0)
Lymphs Abs: 1.6 10*3/uL (ref 0.7–4.0)
MCH: 29.6 pg (ref 26.0–34.0)
MCHC: 34.2 g/dL (ref 30.0–36.0)
MCV: 86.7 fL (ref 78.0–100.0)
Monocytes Absolute: 1.1 10*3/uL — ABNORMAL HIGH (ref 0.1–1.0)
Monocytes Relative: 10 % (ref 3–12)
Neutrophils Relative %: 72 % (ref 43–77)
RBC: 4.52 MIL/uL (ref 3.87–5.11)

## 2012-02-17 LAB — LIPASE, BLOOD: Lipase: 428 U/L — ABNORMAL HIGH (ref 11–59)

## 2012-02-17 MED ORDER — ONDANSETRON HCL 4 MG/2ML IJ SOLN
4.0000 mg | Freq: Once | INTRAMUSCULAR | Status: AC
Start: 2012-02-17 — End: 2012-02-17
  Administered 2012-02-17: 4 mg via INTRAVENOUS
  Filled 2012-02-17: qty 2

## 2012-02-17 MED ORDER — HYDROMORPHONE HCL PF 1 MG/ML IJ SOLN
1.0000 mg | Freq: Once | INTRAMUSCULAR | Status: AC
  Administered 2012-02-17: 1 mg via INTRAVENOUS
  Filled 2012-02-17: qty 1

## 2012-02-17 MED ORDER — ONDANSETRON HCL 4 MG/2ML IJ SOLN
4.0000 mg | Freq: Once | INTRAMUSCULAR | Status: AC
  Administered 2012-02-17: 4 mg via INTRAVENOUS
  Filled 2012-02-17: qty 2

## 2012-02-17 MED ORDER — DIPHENHYDRAMINE HCL 50 MG/ML IJ SOLN
50.0000 mg | Freq: Once | INTRAMUSCULAR | Status: AC
  Administered 2012-02-17: 50 mg via INTRAVENOUS
  Filled 2012-02-17: qty 1

## 2012-02-17 NOTE — ED Notes (Addendum)
Pt has recurrent pancreatitis and has run out of home pain meds, pt has been having multiple flare ups and is set to have a procedure/surgery in the near future. States pain 10/10 now

## 2012-02-17 NOTE — Discharge Summary (Cosign Needed)
NAME:  MANJOT, BEUMER NO.:  [id number]  MEDICAL RECORD NO.:  [id number]  LOCATION:  A04C                         FACILITY:  [facility]  PHYSICIAN:  Melvyn Novas, MDDATE OF BIRTH:  August 14, 1985  DATE OF ADMISSION:  02/14/2012 DATE OF DISCHARGE:  10/29/2013LH                              DISCHARGE SUMMARY   The patient is a 26 year old white female with a history of pancreatic divisum followed by [facility] with recurrent and chronic episodes of pancreatitis.  She had an episode of pancreatitis, again mild elevations of lipase.  She was placed on intravenous Dilaudid n.p.o. with clear liquids.  Advanced her diet.  She seemed to tolerate this well.  Her lipase is normalized.  Her last visit in the hospital, her lipase was quite normal.  She had no significant epigastric tenderness.  She was complaining of pain and I told her we would no longer continue intravenous Dilaudid.  CT scan showed mild inflammation around the pancreas.  No ductal obstructions or pseudocyst formations.  The patient was quite upset that we would not use intravenous opioid analgesic just oral and she signed out AMA.  Her discharge medicines prior to signing out were Proventil 2 puffs HFA q.i.d., hydroxyzine 25 mg p.o. nightly, oxycodone 5 mg 1 tablet p.o. q.4 h. p.r.n. for epigastric pain and she will hopefully follow up in the office in 1 week's time.  She is also advised to have smoking cessation.     Melvyn Novas, MD     RMD/MEDQ  D:  02/17/2012  T:  02/17/2012  Job:  [id number]
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
## 2012-02-17 NOTE — ED Notes (Signed)
States she has chronic pancreatitis, states it has flared up on her causing nausea and abd pain

## 2012-02-18 ENCOUNTER — Encounter (HOSPITAL_COMMUNITY): Payer: Self-pay | Admitting: *Deleted

## 2012-02-18 LAB — URINALYSIS, ROUTINE W REFLEX MICROSCOPIC
Hgb urine dipstick: NEGATIVE
Leukocytes, UA: NEGATIVE
Nitrite: NEGATIVE
Protein, ur: NEGATIVE mg/dL
Specific Gravity, Urine: >1.03 — ABNORMAL HIGH (ref 1.005–1.030)
Urobilinogen, UA: 0.2 mg/dL (ref 0.0–1.0)

## 2012-02-18 LAB — CBC
MCV: 88.4 fL (ref 78.0–100.0)
Platelets: 326 10*3/uL (ref 150–400)
RBC: 4.21 MIL/uL (ref 3.87–5.11)
RDW: 16.4 % — ABNORMAL HIGH (ref 11.5–15.5)
WBC: 8.7 10*3/uL (ref 4.0–10.5)

## 2012-02-18 LAB — COMPREHENSIVE METABOLIC PANEL
ALT: 8 U/L (ref 0–35)
AST: 11 U/L (ref 0–37)
Albumin: 3.6 g/dL (ref 3.5–5.2)
Chloride: 105 mEq/L (ref 96–112)
Creatinine, Ser: 0.59 mg/dL (ref 0.50–1.10)
Sodium: 139 mEq/L (ref 135–145)
Total Bilirubin: 0.1 mg/dL — ABNORMAL LOW (ref 0.3–1.2)

## 2012-02-18 LAB — RAPID URINE DRUG SCREEN, HOSP PERFORMED
Amphetamines: NOT DETECTED
Barbiturates: POSITIVE — AB
Tetrahydrocannabinol: NOT DETECTED

## 2012-02-18 MED ORDER — TRAZODONE HCL 50 MG PO TABS
50.0000 mg | ORAL_TABLET | Freq: Every evening | ORAL | Status: DC | PRN
  Administered 2012-02-18 (×2): 50 mg via ORAL
  Filled 2012-02-18 (×2): qty 1

## 2012-02-18 MED ORDER — ACETAMINOPHEN 650 MG RE SUPP: 650.0000 mg | Freq: Four times a day (QID) | RECTAL | Status: DC | PRN

## 2012-02-18 MED ORDER — PANTOPRAZOLE SODIUM 40 MG IV SOLR
40.0000 mg | Freq: Two times a day (BID) | INTRAVENOUS | Status: DC
  Administered 2012-02-18 (×3): 40 mg via INTRAVENOUS
  Filled 2012-02-18 (×3): qty 40

## 2012-02-18 MED ORDER — ONDANSETRON HCL 4 MG/2ML IJ SOLN
4.0000 mg | INTRAMUSCULAR | Status: DC | PRN
  Administered 2012-02-18 – 2012-02-19 (×6): 4 mg via INTRAVENOUS
  Filled 2012-02-18 (×6): qty 2

## 2012-02-18 MED ORDER — ACETAMINOPHEN 325 MG PO TABS: 650.0000 mg | ORAL_TABLET | ORAL | Status: DC | PRN

## 2012-02-18 MED ORDER — DIPHENHYDRAMINE HCL 25 MG PO CAPS
25.0000 mg | ORAL_CAPSULE | Freq: Four times a day (QID) | ORAL | Status: DC | PRN
  Administered 2012-02-18 – 2012-02-19 (×3): 25 mg via ORAL
  Filled 2012-02-18 (×3): qty 1

## 2012-02-18 MED ORDER — SODIUM CHLORIDE 0.9 % IJ SOLN
INTRAMUSCULAR | Status: AC
  Administered 2012-02-18: 10 mL
  Filled 2012-02-18: qty 3

## 2012-02-18 MED ORDER — HYDROMORPHONE HCL PF 1 MG/ML IJ SOLN
0.5000 mg | INTRAMUSCULAR | Status: DC | PRN
  Administered 2012-02-18 (×8): 0.5 mg via INTRAVENOUS
  Administered 2012-02-18: 22:00:00 via INTRAVENOUS
  Administered 2012-02-18 – 2012-02-19 (×6): 0.5 mg via INTRAVENOUS
  Filled 2012-02-18 (×15): qty 1

## 2012-02-18 MED ORDER — ENOXAPARIN SODIUM 40 MG/0.4ML ~~LOC~~ SOLN
40.0000 mg | SUBCUTANEOUS | Status: DC
  Filled 2012-02-18: qty 0.4

## 2012-02-18 MED ORDER — POTASSIUM CHLORIDE IN NACL 20-0.9 MEQ/L-% IV SOLN
INTRAVENOUS | Status: DC
  Administered 2012-02-18 (×3): via INTRAVENOUS

## 2012-02-18 MED ORDER — FLEET ENEMA 7-19 GM/118ML RE ENEM: 1.0000 | ENEMA | Freq: Once | RECTAL | Status: AC | PRN

## 2012-02-18 NOTE — Care Management Note (Unsigned)
    Page 1 of 1   02/18/2012     3:55:47 PM   CARE MANAGEMENT NOTE 02/18/2012  Patient:  [name], [name]   Account Number:  [id number]  Date Initiated:  02/18/2012  Documentation initiated by:  Sharrie Rothman  Subjective/Objective Assessment:   Pt admitted from home with pancreatitis. Pt lives with her husband and will return home at discharge. Pt is indpendent with ADL's.     Action/Plan:   Financial counselor is aware of pts self pay status. Pt may need help with meds at discharge. Will continue to follow.   Anticipated DC Date:  02/18/2012   Anticipated DC Plan:  HOME/SELF CARE  In-house referral  Financial Counselor      DC Planning Services  CM consult      Choice offered to / List presented to:             Status of service:  In process, will continue to follow Medicare Important Message given?   (If response is "NO", the following Medicare IM given date fields will be blank) Date Medicare IM given:   Date Additional Medicare IM given:    Discharge Disposition:    Per UR Regulation:    If discussed at Long Length of Stay Meetings, dates discussed:    Comments:  02/18/12 1127 [name], RN BSN CM Pt has no PCP. Encouraged pt to follow up with [facility] until pt is able to get insurance.
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
## 2012-02-18 NOTE — Progress Notes (Signed)
UR Chart Review Completed  
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
## 2012-02-19 ENCOUNTER — Encounter (HOSPITAL_COMMUNITY): Payer: Self-pay | Admitting: Internal Medicine

## 2012-02-19 DIAGNOSIS — E876 Hypokalemia: Secondary | ICD-10-CM | POA: Diagnosis not present

## 2012-02-19 DIAGNOSIS — Q453 Other congenital malformations of pancreas and pancreatic duct: Secondary | ICD-10-CM

## 2012-02-19 LAB — BASIC METABOLIC PANEL
BUN: 4 mg/dL — ABNORMAL LOW (ref 6–23)
CO2: 29 mEq/L (ref 19–32)
Calcium: 9.6 mg/dL (ref 8.4–10.5)
Chloride: 105 mEq/L (ref 96–112)
Creatinine, Ser: 0.61 mg/dL (ref 0.50–1.10)
Glucose, Bld: 108 mg/dL — ABNORMAL HIGH (ref 70–99)

## 2012-02-19 MED ORDER — OXYCODONE HCL 5 MG PO TABS
5.0000 mg | ORAL_TABLET | ORAL | Status: DC | PRN
  Administered 2012-02-19: 5 mg via ORAL
  Filled 2012-02-19: qty 1

## 2012-02-19 MED ORDER — OXYCODONE HCL 5 MG PO TABS: 5.0000 mg | ORAL_TABLET | ORAL | Status: DC | PRN

## 2012-02-19 MED ORDER — OMEPRAZOLE 20 MG PO CPDR: 20.0000 mg | DELAYED_RELEASE_CAPSULE | Freq: Every day | ORAL | Status: DC

## 2012-02-19 MED ORDER — OXYCODONE-ACETAMINOPHEN 5-325 MG PO TABS
1.0000 | ORAL_TABLET | ORAL | Status: DC | PRN
  Filled 2012-02-19: qty 1

## 2012-02-19 NOTE — Progress Notes (Signed)
Patient received discharge instructions along with follow up appointments and prescriptions. Patient verbalized understanding of all instructions. Patient was escorted by staff to vehicle. Patient discharged to home in stable condition. 

## 2012-02-19 NOTE — Discharge Summary (Signed)
Physician Discharge Summary  [name] ZOX:[id number] DOB: 1985/11/14 DOA: 02/17/2012  PCP: Primary gastroenterologist: Dr. Rudean Dunn at [facility].  Admit date: 02/17/2012 Discharge date: 02/19/2012  Time spent: Less than 30 minutes  Recommendations for Outpatient Follow-up:  1. The patient was discharged to home in improved condition. She will followup with her gastroenterologist Dr. Chestine Dunn as scheduled in 2-3 weeks.  Discharge Diagnoses:  1. Recurrent acute pancreatitis in a patient with pancreas divisum. 2. Tobacco abuse. Advised to quit smoking. 3. Mild leukocytosis, resolved. 4. Hypokalemia. Repleted/supplement.  Discharge Condition: Improved and stable.  Diet recommendation: Low-fat diet.  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 Apparently, she had been discharged several days before her presentation by her former primary care physician, Dr. Delbert Dunn for the same. In the emergency department, she was afebrile and hemodynamically stable. Her lab data were significant for normal liver transaminases and lipase of 428. Her WBC was 10.6. She was admitted for further evaluation and management.  Hospital Course:  The patient was started on a clear liquid diet. IV fluids were initiated for hydration. Pain management was started with as needed IV hydromorphone as needed. IV Protonix was given empirically and for treatment of gastroesophageal reflux disease. Zofran was ordered as needed for nausea. Her followup laboratory studies revealed mild hypokalemia. She was repleted orally and  in the IV fluids. Her followup WBC resolved without antibiotics. The leukocytosis was secondary to margination and to acute pancreatitis. She was advised to stop smoking.  Over the course of the hospitalization, her abdominal pain and nausea resolved.her lipase decreased from 428 to 96 prior to discharge. Her diet was advanced from clear liquids full liquids which she tolerated well. She was discharged to home in improved condition. She was given a prescription for omeprazole and Roxicodone. She will followup with her primary gastroenterologist at [facility] as scheduled.  Procedures:  None  Consultations:  None  Discharge Exam: Filed Vitals:   02/18/12 2107 02/19/12 0203 02/19/12 0446 02/19/12 0854  BP: 128/93 105/76 107/58   Pulse: 104 66 72 70  Temp: 97.7 F (36.5 C) 97.6 F (36.4 C) 97.5 F (36.4 C)   TempSrc: Oral Oral Oral   Resp: 18 20 18    Height:      Weight:   53.5 kg (117 lb 15.1 oz)   SpO2: 100% 100% 100% 98%    General: Alert 26 year old Caucasian woman sitting up in bed, in no acute distress. Cardiovascular: S1, S2, with no murmurs rubs or gallops. Respiratory: Rare/occasional wheezes. Breathing nonlabored. Abdomen: Positive bowel sounds, soft, very minimal tenderness in the epigastrium, no distention, no masses palpated.  Discharge Instructions  Discharge Orders    Future Orders Please Complete By Expires   Increase activity slowly      Discharge instructions      Comments:   Follow a low fat diet. Try to stop smoking.       Medication List     As of 02/19/2012  9:37 AM    TAKE these medications         albuterol 108 (90 BASE) MCG/ACT  inhaler   Commonly known as: PROVENTIL HFA;VENTOLIN HFA   Inhale 2 puffs into the lungs every 6 (six) hours as needed. For shortness of breath      hydrOXYzine 25 MG tablet   Commonly known as: ATARAX/VISTARIL   Take 25 mg by mouth at bedtime.      LORazepam 1 MG tablet   Commonly known as: ATIVAN   Take 1 mg by  mouth 3 (three) times daily as needed. For anxiety      omeprazole 20 MG capsule   Commonly known as: PRILOSEC   Take 1 capsule (20 mg total) by mouth daily.      oxyCODONE 5 MG immediate release tablet   Commonly known as: Oxy IR/ROXICODONE   Take 1 tablet (5 mg total) by mouth every 4 (four) hours as needed for pain.      promethazine 25 MG suppository   Commonly known as: PHENERGAN   Place 1 suppository (25 mg total) rectally every 6 (six) hours as needed for nausea.      promethazine 25 MG tablet   Commonly known as: PHENERGAN   Take 1 tablet (25 mg total) by mouth every 6 (six) hours as needed for nausea.      zolpidem 10 MG tablet   Commonly known as: AMBIEN   Take 10 mg by mouth at bedtime.           Follow-up Information    Please follow up. (Followup with your primary gastroenterologist, Dr. Chestine Dunn as scheduled in 3 weeks.)           The results of significant diagnostics from this hospitalization (including imaging, microbiology, ancillary and laboratory) are listed below for reference.    Significant Diagnostic Studies: No results found.  Microbiology: No results found for this or any previous visit (from the past 240 hour(s)).   Labs: Basic Metabolic Panel:  Lab 02/19/12 7829 02/18/12 0451 02/17/12 1955 02/14/12 1750 02/12/12 1600  NA 140 139 140 139 136  K 4.1 3.4* 3.5 3.8 3.6  CL 105 105 103 100 98  CO2 29 26 27 27 25   GLUCOSE 108* 107* 127* 107* 94  BUN 4* 11 14 6  5*  CREATININE 0.61 0.59 0.59 0.59 0.58  CALCIUM 9.6 9.0 10.0 10.4 10.2  MG -- -- -- -- --  PHOS -- -- -- -- --   Liver Function Tests:  Lab 02/18/12 0451 02/17/12 1955 02/12/12 1600  AST 11 13 16   ALT 8 10 17   ALKPHOS 62 69 85  BILITOT 0.1* 0.1* 0.3  PROT 6.6 7.4 8.4*  ALBUMIN 3.6 4.0 4.8    Lab 02/19/12 0506 02/17/12 1955 02/14/12 1750 02/12/12 1600  LIPASE 96* 428* 35 37  AMYLASE -- -- -- --   No results found for this basename: AMMONIA:5 in the last 168  hours CBC:  Lab 02/18/12 0451 02/17/12 1955 02/14/12 1750 02/12/12 1600  WBC 8.7 10.6* 8.8 6.8  NEUTROABS -- 7.7 5.1 3.6  HGB 12.5 13.4 15.0 13.9  HCT 37.2 39.2 43.4 41.9  MCV 88.4 86.7 86.6 87.3  PLT 326 347 401* 317   Cardiac Enzymes: No results found for this basename: CKTOTAL:5,CKMB:5,CKMBINDEX:5,TROPONINI:5 in the last 168 hours BNP: BNP (last 3 results) No results found for this basename: PROBNP:3 in the last 8760 hours CBG: No results found for this basename: GLUCAP:5 in the last 168 hours     Signed:  Walden Statz  [facility] 02/19/2012, 9:37 AM

## 2012-02-27 ENCOUNTER — Observation Stay (HOSPITAL_COMMUNITY)
Admission: EM | Admit: 2012-02-27 | Discharge: 2012-02-27 | Disposition: A | Discharge status: Home / Self Care | Payer: Self-pay | Attending: Internal Medicine | Admitting: Internal Medicine

## 2012-02-27 ENCOUNTER — Encounter (HOSPITAL_COMMUNITY): Payer: Self-pay | Admitting: Emergency Medicine

## 2012-02-27 DIAGNOSIS — F19939 Other psychoactive substance use, unspecified with withdrawal, unspecified: Principal | ICD-10-CM | POA: Insufficient documentation

## 2012-02-27 DIAGNOSIS — R1110 Vomiting, unspecified: Secondary | ICD-10-CM | POA: Insufficient documentation

## 2012-02-27 DIAGNOSIS — E876 Hypokalemia: Secondary | ICD-10-CM

## 2012-02-27 DIAGNOSIS — I10 Essential (primary) hypertension: Secondary | ICD-10-CM | POA: Insufficient documentation

## 2012-02-27 DIAGNOSIS — Z720 Tobacco use: Secondary | ICD-10-CM | POA: Diagnosis present

## 2012-02-27 DIAGNOSIS — D649 Anemia, unspecified: Secondary | ICD-10-CM

## 2012-02-27 DIAGNOSIS — K859 Acute pancreatitis without necrosis or infection, unspecified: Secondary | ICD-10-CM

## 2012-02-27 DIAGNOSIS — F17200 Nicotine dependence, unspecified, uncomplicated: Secondary | ICD-10-CM | POA: Insufficient documentation

## 2012-02-27 DIAGNOSIS — Q453 Other congenital malformations of pancreas and pancreatic duct: Secondary | ICD-10-CM

## 2012-02-27 DIAGNOSIS — F1193 Opioid use, unspecified with withdrawal: Secondary | ICD-10-CM | POA: Diagnosis present

## 2012-02-27 DIAGNOSIS — F1123 Opioid dependence with withdrawal: Secondary | ICD-10-CM

## 2012-02-27 DIAGNOSIS — F1120 Opioid dependence, uncomplicated: Secondary | ICD-10-CM | POA: Diagnosis present

## 2012-02-27 DIAGNOSIS — R112 Nausea with vomiting, unspecified: Secondary | ICD-10-CM | POA: Diagnosis present

## 2012-02-27 DIAGNOSIS — R109 Unspecified abdominal pain: Secondary | ICD-10-CM

## 2012-02-27 HISTORY — DX: Opioid dependence, uncomplicated: F11.20

## 2012-02-27 LAB — COMPREHENSIVE METABOLIC PANEL
AST: 17 U/L (ref 0–37)
BUN: 17 mg/dL (ref 6–23)
CO2: 25 mEq/L (ref 19–32)
Chloride: 100 mEq/L (ref 96–112)
Creatinine, Ser: 0.58 mg/dL (ref 0.50–1.10)
GFR calc Af Amer: >90 mL/min (ref >90)
GFR calc non Af Amer: >90 mL/min (ref >90)
Glucose, Bld: 109 mg/dL — ABNORMAL HIGH (ref 70–99)
Total Bilirubin: 0.1 mg/dL — ABNORMAL LOW (ref 0.3–1.2)

## 2012-02-27 LAB — CBC
MCV: 87.2 fL (ref 78.0–100.0)
Platelets: 266 10*3/uL (ref 150–400)
RDW: 15.8 % — ABNORMAL HIGH (ref 11.5–15.5)
WBC: 6.6 10*3/uL (ref 4.0–10.5)

## 2012-02-27 LAB — LIPASE, BLOOD: Lipase: 91 U/L — ABNORMAL HIGH (ref 11–59)

## 2012-02-27 MED ORDER — PROMETHAZINE HCL 25 MG/ML IJ SOLN: 12.5000 mg | Freq: Four times a day (QID) | INTRAMUSCULAR | Status: DC | PRN

## 2012-02-27 MED ORDER — PROMETHAZINE HCL 25 MG PO TABS: 12.5000 mg | ORAL_TABLET | Freq: Four times a day (QID) | ORAL | Status: DC | PRN

## 2012-02-27 MED ORDER — ONDANSETRON HCL 4 MG/2ML IJ SOLN
4.0000 mg | Freq: Once | INTRAMUSCULAR | Status: AC
  Administered 2012-02-27: 4 mg via INTRAVENOUS
  Filled 2012-02-27: qty 2

## 2012-02-27 MED ORDER — HYDROMORPHONE HCL PF 2 MG/ML IJ SOLN
2.0000 mg | Freq: Once | INTRAMUSCULAR | Status: AC
  Administered 2012-02-27: 2 mg via INTRAVENOUS
  Filled 2012-02-27: qty 1

## 2012-02-27 MED ORDER — ONDANSETRON HCL 4 MG/2ML IJ SOLN
4.0000 mg | Freq: Four times a day (QID) | INTRAMUSCULAR | Status: DC | PRN
  Filled 2012-02-27: qty 2

## 2012-02-27 MED ORDER — ACETAMINOPHEN 325 MG PO TABS: 650.0000 mg | ORAL_TABLET | Freq: Four times a day (QID) | ORAL | Status: DC | PRN

## 2012-02-27 MED ORDER — LORAZEPAM 0.5 MG PO TABS: 1.0000 mg | ORAL_TABLET | Freq: Three times a day (TID) | ORAL | Status: DC | PRN

## 2012-02-27 MED ORDER — ENOXAPARIN SODIUM 40 MG/0.4ML ~~LOC~~ SOLN
40.0000 mg | SUBCUTANEOUS | Status: DC
  Filled 2012-02-27: qty 0.4

## 2012-02-27 MED ORDER — SODIUM CHLORIDE 0.9 % IJ SOLN
INTRAMUSCULAR | Status: AC
  Administered 2012-02-27: 3 mL
  Filled 2012-02-27: qty 3

## 2012-02-27 MED ORDER — ALBUTEROL SULFATE (5 MG/ML) 0.5% IN NEBU: 2.5000 mg | INHALATION_SOLUTION | RESPIRATORY_TRACT | Status: DC | PRN

## 2012-02-27 MED ORDER — SODIUM CHLORIDE 0.9 % IV SOLN
INTRAVENOUS | Status: DC
  Administered 2012-02-27: 10:00:00 via INTRAVENOUS

## 2012-02-27 MED ORDER — HYDROMORPHONE HCL PF 1 MG/ML IJ SOLN
1.0000 mg | Freq: Once | INTRAMUSCULAR | Status: AC
  Administered 2012-02-27: 1 mg via INTRAVENOUS
  Filled 2012-02-27: qty 1

## 2012-02-27 MED ORDER — SODIUM CHLORIDE 0.9 % IV SOLN
Freq: Once | INTRAVENOUS | Status: AC
  Administered 2012-02-27: 20 mL/h via INTRAVENOUS

## 2012-02-27 MED ORDER — HYDROMORPHONE HCL PF 1 MG/ML IJ SOLN
1.0000 mg | INTRAMUSCULAR | Status: DC | PRN
  Administered 2012-02-27 (×3): 1 mg via INTRAVENOUS
  Filled 2012-02-27 (×3): qty 1

## 2012-02-27 MED ORDER — NICOTINE 14 MG/24HR TD PT24
14.0000 mg | MEDICATED_PATCH | Freq: Every day | TRANSDERMAL | Status: DC
  Administered 2012-02-27: 14 mg via TRANSDERMAL
  Filled 2012-02-27: qty 1

## 2012-02-27 MED ORDER — DIPHENHYDRAMINE HCL 50 MG/ML IJ SOLN
25.0000 mg | Freq: Once | INTRAMUSCULAR | Status: AC
  Administered 2012-02-27: 25 mg via INTRAVENOUS

## 2012-02-27 MED ORDER — ACETAMINOPHEN 650 MG RE SUPP: 650.0000 mg | Freq: Four times a day (QID) | RECTAL | Status: DC | PRN

## 2012-02-27 MED ORDER — OXYCODONE HCL 5 MG PO TABS: 5.0000 mg | ORAL_TABLET | ORAL | Status: DC | PRN

## 2012-02-27 MED ORDER — DIPHENHYDRAMINE HCL 50 MG/ML IJ SOLN
25.0000 mg | Freq: Once | INTRAMUSCULAR | Status: DC
  Filled 2012-02-27: qty 1

## 2012-02-27 MED ORDER — ONDANSETRON HCL 4 MG PO TABS
4.0000 mg | ORAL_TABLET | Freq: Four times a day (QID) | ORAL | Status: DC | PRN
  Administered 2012-02-27: 4 mg via ORAL

## 2012-02-27 MED ORDER — DIPHENHYDRAMINE HCL 25 MG PO CAPS
25.0000 mg | ORAL_CAPSULE | Freq: Four times a day (QID) | ORAL | Status: DC | PRN
  Administered 2012-02-27 (×2): 25 mg via ORAL
  Filled 2012-02-27 (×2): qty 1

## 2012-02-27 MED ORDER — PANTOPRAZOLE SODIUM 40 MG IV SOLR
40.0000 mg | INTRAVENOUS | Status: DC
  Administered 2012-02-27: 40 mg via INTRAVENOUS
  Filled 2012-02-27: qty 40

## 2012-02-27 MED ORDER — DIPHENHYDRAMINE HCL 50 MG/ML IJ SOLN
25.0000 mg | Freq: Once | INTRAMUSCULAR | Status: AC
  Administered 2012-02-27: 25 mg via INTRAVENOUS
  Filled 2012-02-27: qty 1

## 2012-02-27 NOTE — Progress Notes (Signed)
Pt discharged home with instructions & prescriptions pt verbalized understanding. Pt left the floor via w/c with staff and family in stable condition.

## 2012-02-27 NOTE — ED Notes (Signed)
Awoke an hour ago with Nausea, vomiting-  Nausea med did not stay down.
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
## 2012-02-27 NOTE — Discharge Summary (Signed)
Physician Discharge Summary  Patient ID: [name] MRN: [id number] DOB/AGE: 06/05/1985 26 y.o.  Admit date: 02/27/2012 Discharge date: 02/27/2012  Discharge Diagnoses:  Principal Problem:  *Opiate withdrawal Active Problems:  Pancreas divisum  Opiate dependence  Tobacco abuse    Medication List     As of 02/27/2012  4:25 PM    TAKE these medications         acetaminophen 325 MG tablet   Commonly known as: TYLENOL   Take 2 tablets (650 mg total) by mouth every 6 (six) hours as needed (or Fever >/= 101).      albuterol 108 (90 BASE) MCG/ACT inhaler   Commonly known as: PROVENTIL HFA;VENTOLIN HFA   Inhale 2 puffs into the lungs every 6 (six) hours as needed. For shortness of breath      hydrOXYzine 25 MG tablet   Commonly known as: ATARAX/VISTARIL   Take 25 mg by mouth at bedtime.      LORazepam 1 MG tablet   Commonly known as: ATIVAN   Take 1 mg by mouth 3 (three) times daily as needed. For anxiety      omeprazole 20 MG capsule   Commonly known as: PRILOSEC   Take 1 capsule (20 mg total) by mouth daily.      oxyCODONE 5 MG immediate release tablet   Commonly known as: Oxy IR/ROXICODONE   Take 1 tablet (5 mg total) by mouth every 4 (four) hours as needed for pain., 10 tablets      promethazine 25 MG suppository   Commonly known as: PHENERGAN   Place 1 suppository (25 mg total) rectally every 6 (six) hours as needed for nausea.      promethazine 25 MG tablet   Commonly known as: PHENERGAN   Take 0.5 tablets (12.5 mg total) by mouth every 6 (six) hours as needed for nausea.      zolpidem 10 MG tablet   Commonly known as: AMBIEN   Take 10 mg by mouth at bedtime.            Discharge Orders    Future Orders Please Complete By Expires   Discharge instructions      Comments:   Low fat diet   Activity as tolerated - No restrictions         Follow-up Information    Schedule an appointment as soon as possible for a visit with primary care provider of  your choice. (to establish care)          Disposition: 01-Home or Self Care  Discharged Condition: stable  Consults:  none  Labs:   Results for orders placed during the hospital encounter of 02/27/12 (from the past 48 hour(s))  COMPREHENSIVE METABOLIC PANEL     Status: Abnormal   Collection Time   02/27/12  4:54 AM      Component Value Range Comment   Sodium 136  135 - 145 mEq/L    Potassium 3.7  3.5 - 5.1 mEq/L    Chloride 100  96 - 112 mEq/L    CO2 25  19 - 32 mEq/L    Glucose, Bld 109 (*) 70 - 99 mg/dL    BUN 17  6 - 23 mg/dL    Creatinine, Ser 8.29  0.50 - 1.10 mg/dL    Calcium 56.2  8.4 - 10.5 mg/dL    Total Protein 7.9  6.0 - 8.3 g/dL    Albumin 4.2  3.5 - 5.2 g/dL    AST 17  0 - 37 U/L    ALT 24  0 - 35 U/L    Alkaline Phosphatase 91  39 - 117 U/L    Total Bilirubin 0.1 (*) 0.3 - 1.2 mg/dL    GFR calc non Af Amer >90  >90 mL/min    GFR calc Af Amer >90  >90 mL/min   LIPASE, BLOOD     Status: Abnormal   Collection Time   02/27/12  4:54 AM      Component Value Range Comment   Lipase 91 (*) 11 - 59 U/L   CBC     Status: Abnormal   Collection Time   02/27/12  4:54 AM      Component Value Range Comment   WBC 6.6  4.0 - 10.5 K/uL    RBC 4.77  3.87 - 5.11 MIL/uL    Hemoglobin 14.3  12.0 - 15.0 g/dL    HCT 16.1  09.6 - 04.5 %    MCV 87.2  78.0 - 100.0 fL    MCH 30.0  26.0 - 34.0 pg    MCHC 34.4  30.0 - 36.0 g/dL    RDW 40.9 (*) 81.1 - 15.5 %    Platelets 266  150 - 400 K/uL   MRSA PCR SCREENING     Status: Normal   Collection Time   02/27/12  2:00 PM      Component Value Range Comment   MRSA by PCR NEGATIVE  NEGATIVE     Diagnostics:  No results found.  Full Code   Hospital Course: Ms. Borys is a 26 year old white female with recurrent visits to the emergency room and hospitalizations for abdominal pain. She has a history of recurrent pancreatitis and pancreas divisum. She was just recently discharged from the hospital for an episode of acute  pancreatitis. He came back to the emergency room complaining of abdominal pain and intractable vomiting. She was given IV fluids, pain medication nausea medication but still felt pain and nausea. Her lipase was slightly elevated. During previous episodes of pancreatitis, her lipase was much higher. I noted the patient as she was being wheeled up to the room, to be talking, smiling and joking. She did not appear to be in pain even from down the hallway. I requested the nurse advance her diet to solid immediately. Patient tolerated her diet. I confronted her about the discrepancy. She admitted that she had run out of her pain medication a few days prior to admission. I suspect she was withdrawing from her opiates. Because her turnaround was so quick, this is unlikely to be acute pancreatitis. She will continue to be a high utilizer of the ER and hospital unless she finds a primary care provider. She would also benefit from a pain management specialist. She reports she had been seen previously at by a pain management physician when she was insured. She had been seen by Dr. Delbert Harness but reports that she no longer sees him because she lost her insurance. I encouraged her to go to the health department. She is an appointment sent with her gastroenterologist [facility]. Hopefully, her pancreas divisum can be addressed definitively.  Discharge Exam:  Blood pressure 126/88, pulse 72, temperature 97.5 F (36.4 C), temperature source Oral, resp. rate 18, height 5\' 5"  (1.651 m), weight 46.267 kg (102 lb), last menstrual period 01/23/2012, SpO2 98.00%.  General: Patient is comfortable. Talkative. Pleasant and cooperative. Lungs clear to auscultation bilaterally without wheeze rhonchi or rales Cardiovascular  regular rate rhythm without murmurs gallops rubs Abdomen soft nontender nondistended  Signed: Tiffnay Bossi L 02/27/2012, 4:25 PM

## 2012-03-02 ENCOUNTER — Encounter (HOSPITAL_COMMUNITY): Payer: Self-pay | Admitting: Emergency Medicine

## 2012-03-02 ENCOUNTER — Emergency Department (HOSPITAL_COMMUNITY)
Admission: EM | Admit: 2012-03-02 | Discharge: 2012-03-02 | Disposition: A | Discharge status: Home / Self Care | Payer: Self-pay | Attending: Emergency Medicine | Admitting: Emergency Medicine

## 2012-03-02 DIAGNOSIS — F17200 Nicotine dependence, unspecified, uncomplicated: Secondary | ICD-10-CM | POA: Insufficient documentation

## 2012-03-02 DIAGNOSIS — Z8719 Personal history of other diseases of the digestive system: Secondary | ICD-10-CM | POA: Insufficient documentation

## 2012-03-02 DIAGNOSIS — K219 Gastro-esophageal reflux disease without esophagitis: Secondary | ICD-10-CM | POA: Insufficient documentation

## 2012-03-02 DIAGNOSIS — F1210 Cannabis abuse, uncomplicated: Secondary | ICD-10-CM | POA: Insufficient documentation

## 2012-03-02 DIAGNOSIS — F3289 Other specified depressive episodes: Secondary | ICD-10-CM | POA: Insufficient documentation

## 2012-03-02 DIAGNOSIS — F1110 Opioid abuse, uncomplicated: Secondary | ICD-10-CM | POA: Insufficient documentation

## 2012-03-02 DIAGNOSIS — Z9889 Other specified postprocedural states: Secondary | ICD-10-CM | POA: Insufficient documentation

## 2012-03-02 DIAGNOSIS — R112 Nausea with vomiting, unspecified: Secondary | ICD-10-CM

## 2012-03-02 DIAGNOSIS — F329 Major depressive disorder, single episode, unspecified: Secondary | ICD-10-CM | POA: Insufficient documentation

## 2012-03-02 DIAGNOSIS — IMO0002 Reserved for concepts with insufficient information to code with codable children: Secondary | ICD-10-CM | POA: Insufficient documentation

## 2012-03-02 DIAGNOSIS — Z79899 Other long term (current) drug therapy: Secondary | ICD-10-CM | POA: Insufficient documentation

## 2012-03-02 DIAGNOSIS — R109 Unspecified abdominal pain: Secondary | ICD-10-CM | POA: Insufficient documentation

## 2012-03-02 DIAGNOSIS — K861 Other chronic pancreatitis: Secondary | ICD-10-CM | POA: Insufficient documentation

## 2012-03-02 DIAGNOSIS — F411 Generalized anxiety disorder: Secondary | ICD-10-CM | POA: Insufficient documentation

## 2012-03-02 DIAGNOSIS — I10 Essential (primary) hypertension: Secondary | ICD-10-CM | POA: Insufficient documentation

## 2012-03-02 LAB — LIPASE, BLOOD: Lipase: 58 U/L (ref 11–59)

## 2012-03-02 MED ORDER — HYDROMORPHONE HCL PF 1 MG/ML IJ SOLN
1.0000 mg | Freq: Once | INTRAMUSCULAR | Status: AC
  Administered 2012-03-02: 1 mg via INTRAVENOUS
  Filled 2012-03-02: qty 1

## 2012-03-02 MED ORDER — DIPHENHYDRAMINE HCL 50 MG/ML IJ SOLN
25.0000 mg | Freq: Once | INTRAMUSCULAR | Status: AC
  Administered 2012-03-02: 25 mg via INTRAVENOUS
  Filled 2012-03-02: qty 1

## 2012-03-02 MED ORDER — SODIUM CHLORIDE 0.9 % IV BOLUS (SEPSIS)
1000.0000 mL | Freq: Once | INTRAVENOUS | Status: AC
  Administered 2012-03-02: 1000 mL via INTRAVENOUS

## 2012-03-02 MED ORDER — HYDROMORPHONE HCL PF 2 MG/ML IJ SOLN
2.0000 mg | Freq: Once | INTRAMUSCULAR | Status: AC
  Administered 2012-03-02: 2 mg via INTRAVENOUS
  Filled 2012-03-02: qty 1

## 2012-03-02 MED ORDER — ONDANSETRON HCL 4 MG/2ML IJ SOLN
4.0000 mg | Freq: Once | INTRAMUSCULAR | Status: AC
  Administered 2012-03-02: 4 mg via INTRAVENOUS
  Filled 2012-03-02: qty 2
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
## 2012-03-02 NOTE — ED Notes (Signed)
Pt presents with pain from pancreatitis, is scheduled for potential surgery at [facility] but until then has been having multiple flare ups of her pain. No other complaints at this time.

## 2012-03-05 ENCOUNTER — Encounter (HOSPITAL_COMMUNITY): Payer: Self-pay | Admitting: Emergency Medicine

## 2012-03-05 ENCOUNTER — Emergency Department (HOSPITAL_COMMUNITY)
Admission: EM | Admit: 2012-03-05 | Discharge: 2012-03-05 | Disposition: A | Discharge status: Home / Self Care | Payer: Self-pay | Attending: Emergency Medicine | Admitting: Emergency Medicine

## 2012-03-05 DIAGNOSIS — F3289 Other specified depressive episodes: Secondary | ICD-10-CM | POA: Insufficient documentation

## 2012-03-05 DIAGNOSIS — IMO0002 Reserved for concepts with insufficient information to code with codable children: Secondary | ICD-10-CM | POA: Insufficient documentation

## 2012-03-05 DIAGNOSIS — F329 Major depressive disorder, single episode, unspecified: Secondary | ICD-10-CM | POA: Insufficient documentation

## 2012-03-05 DIAGNOSIS — F411 Generalized anxiety disorder: Secondary | ICD-10-CM | POA: Insufficient documentation

## 2012-03-05 DIAGNOSIS — K861 Other chronic pancreatitis: Secondary | ICD-10-CM | POA: Insufficient documentation

## 2012-03-05 DIAGNOSIS — Z9889 Other specified postprocedural states: Secondary | ICD-10-CM | POA: Insufficient documentation

## 2012-03-05 DIAGNOSIS — R1013 Epigastric pain: Secondary | ICD-10-CM | POA: Insufficient documentation

## 2012-03-05 DIAGNOSIS — K219 Gastro-esophageal reflux disease without esophagitis: Secondary | ICD-10-CM | POA: Insufficient documentation

## 2012-03-05 DIAGNOSIS — I10 Essential (primary) hypertension: Secondary | ICD-10-CM | POA: Insufficient documentation

## 2012-03-05 DIAGNOSIS — F17200 Nicotine dependence, unspecified, uncomplicated: Secondary | ICD-10-CM | POA: Insufficient documentation

## 2012-03-05 DIAGNOSIS — F1110 Opioid abuse, uncomplicated: Secondary | ICD-10-CM | POA: Insufficient documentation

## 2012-03-05 DIAGNOSIS — R109 Unspecified abdominal pain: Secondary | ICD-10-CM

## 2012-03-05 DIAGNOSIS — R112 Nausea with vomiting, unspecified: Secondary | ICD-10-CM | POA: Insufficient documentation

## 2012-03-05 DIAGNOSIS — Z79899 Other long term (current) drug therapy: Secondary | ICD-10-CM | POA: Insufficient documentation

## 2012-03-05 DIAGNOSIS — F1210 Cannabis abuse, uncomplicated: Secondary | ICD-10-CM | POA: Insufficient documentation

## 2012-03-05 LAB — CBC WITH DIFFERENTIAL/PLATELET
Basophils Absolute: 0 10*3/uL (ref 0.0–0.1)
HCT: 41.3 % (ref 36.0–46.0)
Lymphocytes Relative: 30 % (ref 12–46)
Monocytes Absolute: 0.8 10*3/uL (ref 0.1–1.0)
Neutro Abs: 4.4 10*3/uL (ref 1.7–7.7)
Platelets: 416 10*3/uL — ABNORMAL HIGH (ref 150–400)
RDW: 16.5 % — ABNORMAL HIGH (ref 11.5–15.5)
WBC: 7.6 10*3/uL (ref 4.0–10.5)

## 2012-03-05 LAB — COMPREHENSIVE METABOLIC PANEL
ALT: 24 U/L (ref 0–35)
AST: 16 U/L (ref 0–37)
Alkaline Phosphatase: 94 U/L (ref 39–117)
CO2: 26 mEq/L (ref 19–32)
Chloride: 100 mEq/L (ref 96–112)
GFR calc non Af Amer: >90 mL/min (ref >90)
Potassium: 3.2 mEq/L — ABNORMAL LOW (ref 3.5–5.1)
Sodium: 140 mEq/L (ref 135–145)
Total Bilirubin: 0.3 mg/dL (ref 0.3–1.2)

## 2012-03-05 MED ORDER — HYDROMORPHONE HCL PF 1 MG/ML IJ SOLN
1.0000 mg | Freq: Once | INTRAMUSCULAR | Status: AC
  Administered 2012-03-05: 1 mg via INTRAVENOUS
  Filled 2012-03-05: qty 1

## 2012-03-05 MED ORDER — ONDANSETRON HCL 4 MG/2ML IJ SOLN
4.0000 mg | Freq: Once | INTRAMUSCULAR | Status: AC
  Administered 2012-03-05: 4 mg via INTRAVENOUS
  Filled 2012-03-05: qty 2

## 2012-03-05 MED ORDER — DIPHENHYDRAMINE HCL 50 MG/ML IJ SOLN
25.0000 mg | Freq: Once | INTRAMUSCULAR | Status: AC
  Administered 2012-03-05: 25 mg via INTRAVENOUS
  Filled 2012-03-05: qty 1

## 2012-03-05 MED ORDER — OXYCODONE HCL 10 MG PO TABS: 10.0000 mg | ORAL_TABLET | Freq: Three times a day (TID) | ORAL | Status: DC | PRN

## 2012-03-05 MED ORDER — SODIUM CHLORIDE 0.9 % IV BOLUS (SEPSIS)
1000.0000 mL | Freq: Once | INTRAVENOUS | Status: AC
  Administered 2012-03-05: 1000 mL via INTRAVENOUS

## 2012-03-05 MED ORDER — DIPHENHYDRAMINE HCL 50 MG/ML IJ SOLN
12.5000 mg | Freq: Once | INTRAMUSCULAR | Status: AC
  Administered 2012-03-05: 12.5 mg via INTRAVENOUS
  Filled 2012-03-05: qty 1
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
## 2012-03-05 NOTE — ED Notes (Signed)
Pt called out and stated her pain was returning. md notified.

## 2012-03-05 NOTE — ED Notes (Signed)
Pt given med for nausea and pain.

## 2012-03-05 NOTE — ED Notes (Signed)
Pt c/o abd pain with vomiting since last night. 

## 2012-03-05 NOTE — ED Notes (Signed)
Pt reports severe ab pain, she has chronically.  Known pancreatitis, has an appointment for ?stent placement in the am, but pain became severe today.

## 2012-03-10 ENCOUNTER — Emergency Department (HOSPITAL_COMMUNITY)
Admission: EM | Admit: 2012-03-10 | Discharge: 2012-03-10 | Disposition: A | Discharge status: Home / Self Care | Payer: Self-pay | Attending: Emergency Medicine | Admitting: Emergency Medicine

## 2012-03-10 ENCOUNTER — Encounter (HOSPITAL_COMMUNITY): Payer: Self-pay | Admitting: Emergency Medicine

## 2012-03-10 DIAGNOSIS — K219 Gastro-esophageal reflux disease without esophagitis: Secondary | ICD-10-CM | POA: Insufficient documentation

## 2012-03-10 DIAGNOSIS — R109 Unspecified abdominal pain: Secondary | ICD-10-CM | POA: Insufficient documentation

## 2012-03-10 DIAGNOSIS — F329 Major depressive disorder, single episode, unspecified: Secondary | ICD-10-CM | POA: Insufficient documentation

## 2012-03-10 DIAGNOSIS — Z8739 Personal history of other diseases of the musculoskeletal system and connective tissue: Secondary | ICD-10-CM | POA: Insufficient documentation

## 2012-03-10 DIAGNOSIS — F3289 Other specified depressive episodes: Secondary | ICD-10-CM | POA: Insufficient documentation

## 2012-03-10 DIAGNOSIS — G8929 Other chronic pain: Secondary | ICD-10-CM | POA: Insufficient documentation

## 2012-03-10 DIAGNOSIS — F1120 Opioid dependence, uncomplicated: Secondary | ICD-10-CM | POA: Insufficient documentation

## 2012-03-10 DIAGNOSIS — Z9889 Other specified postprocedural states: Secondary | ICD-10-CM | POA: Insufficient documentation

## 2012-03-10 DIAGNOSIS — F17200 Nicotine dependence, unspecified, uncomplicated: Secondary | ICD-10-CM | POA: Insufficient documentation

## 2012-03-10 DIAGNOSIS — Z8619 Personal history of other infectious and parasitic diseases: Secondary | ICD-10-CM | POA: Insufficient documentation

## 2012-03-10 DIAGNOSIS — Z3202 Encounter for pregnancy test, result negative: Secondary | ICD-10-CM | POA: Insufficient documentation

## 2012-03-10 DIAGNOSIS — K859 Acute pancreatitis without necrosis or infection, unspecified: Secondary | ICD-10-CM | POA: Insufficient documentation

## 2012-03-10 DIAGNOSIS — Z79899 Other long term (current) drug therapy: Secondary | ICD-10-CM | POA: Insufficient documentation

## 2012-03-10 LAB — URINALYSIS, ROUTINE W REFLEX MICROSCOPIC
Glucose, UA: NEGATIVE mg/dL
Hgb urine dipstick: NEGATIVE
Protein, ur: 100 mg/dL — AB

## 2012-03-10 LAB — URINE MICROSCOPIC-ADD ON

## 2012-03-10 MED ORDER — HYDROMORPHONE HCL PF 2 MG/ML IJ SOLN
2.0000 mg | Freq: Once | INTRAMUSCULAR | Status: AC
  Administered 2012-03-10: 2 mg via INTRAVENOUS
  Filled 2012-03-10: qty 1

## 2012-03-10 MED ORDER — SODIUM CHLORIDE 0.9 % IV BOLUS (SEPSIS)
1000.0000 mL | Freq: Once | INTRAVENOUS | Status: AC
  Administered 2012-03-10: 1000 mL via INTRAVENOUS

## 2012-03-10 MED ORDER — ONDANSETRON HCL 4 MG/2ML IJ SOLN
4.0000 mg | Freq: Once | INTRAMUSCULAR | Status: AC
  Administered 2012-03-10: 4 mg via INTRAVENOUS
  Filled 2012-03-10: qty 2

## 2012-03-10 MED ORDER — ONDANSETRON HCL 4 MG PO TABS: 4.0000 mg | ORAL_TABLET | Freq: Four times a day (QID) | ORAL | Status: DC

## 2012-03-10 MED ORDER — DIPHENHYDRAMINE HCL 50 MG/ML IJ SOLN
INTRAMUSCULAR | Status: AC
  Administered 2012-03-10: 25 mg
  Filled 2012-03-10: qty 1

## 2012-03-10 MED ORDER — HYDROMORPHONE HCL PF 2 MG/ML IJ SOLN
2.0000 mg | Freq: Once | INTRAMUSCULAR | Status: AC
  Administered 2012-03-10: 2 mg via INTRAVENOUS

## 2012-03-10 MED ORDER — KETOROLAC TROMETHAMINE 30 MG/ML IJ SOLN
30.0000 mg | Freq: Once | INTRAMUSCULAR | Status: AC
  Administered 2012-03-10: 30 mg via INTRAVENOUS
  Filled 2012-03-10 (×2): qty 1

## 2012-03-10 MED ORDER — HYDROMORPHONE HCL PF 2 MG/ML IJ SOLN
INTRAMUSCULAR | Status: AC
  Filled 2012-03-10: qty 1
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
## 2012-03-10 NOTE — ED Notes (Signed)
Pt experiencing some increased pain from pancreatitis, states she was recently at [facility] for similar issues. Also experiencing emesis that started early this AM. Pt has been seen here in the past for the same.

## 2012-03-12 LAB — URINE CULTURE

## 2012-03-15 ENCOUNTER — Encounter (HOSPITAL_COMMUNITY): Payer: Self-pay | Admitting: *Deleted

## 2012-03-15 ENCOUNTER — Emergency Department (HOSPITAL_COMMUNITY)
Admission: EM | Admit: 2012-03-15 | Discharge: 2012-03-15 | Disposition: A | Discharge status: Home / Self Care | Payer: Self-pay | Attending: Emergency Medicine | Admitting: Emergency Medicine

## 2012-03-15 DIAGNOSIS — Z8619 Personal history of other infectious and parasitic diseases: Secondary | ICD-10-CM | POA: Insufficient documentation

## 2012-03-15 DIAGNOSIS — R0789 Other chest pain: Secondary | ICD-10-CM

## 2012-03-15 DIAGNOSIS — F3289 Other specified depressive episodes: Secondary | ICD-10-CM | POA: Insufficient documentation

## 2012-03-15 DIAGNOSIS — F17200 Nicotine dependence, unspecified, uncomplicated: Secondary | ICD-10-CM | POA: Insufficient documentation

## 2012-03-15 DIAGNOSIS — Z79899 Other long term (current) drug therapy: Secondary | ICD-10-CM | POA: Insufficient documentation

## 2012-03-15 DIAGNOSIS — K219 Gastro-esophageal reflux disease without esophagitis: Secondary | ICD-10-CM | POA: Insufficient documentation

## 2012-03-15 DIAGNOSIS — M869 Osteomyelitis, unspecified: Secondary | ICD-10-CM | POA: Insufficient documentation

## 2012-03-15 DIAGNOSIS — I10 Essential (primary) hypertension: Secondary | ICD-10-CM | POA: Insufficient documentation

## 2012-03-15 DIAGNOSIS — K861 Other chronic pancreatitis: Secondary | ICD-10-CM | POA: Insufficient documentation

## 2012-03-15 DIAGNOSIS — R071 Chest pain on breathing: Secondary | ICD-10-CM | POA: Insufficient documentation

## 2012-03-15 DIAGNOSIS — F1120 Opioid dependence, uncomplicated: Secondary | ICD-10-CM | POA: Insufficient documentation

## 2012-03-15 DIAGNOSIS — R112 Nausea with vomiting, unspecified: Secondary | ICD-10-CM | POA: Insufficient documentation

## 2012-03-15 DIAGNOSIS — F329 Major depressive disorder, single episode, unspecified: Secondary | ICD-10-CM | POA: Insufficient documentation

## 2012-03-15 DIAGNOSIS — F411 Generalized anxiety disorder: Secondary | ICD-10-CM | POA: Insufficient documentation

## 2012-03-15 LAB — CBC WITH DIFFERENTIAL/PLATELET
Basophils Absolute: 0 10*3/uL (ref 0.0–0.1)
HCT: 41.4 % (ref 36.0–46.0)
Hemoglobin: 13.9 g/dL (ref 12.0–15.0)
Lymphocytes Relative: 34 % (ref 12–46)
Monocytes Absolute: 0.6 10*3/uL (ref 0.1–1.0)
Neutro Abs: 3.4 10*3/uL (ref 1.7–7.7)
Neutrophils Relative %: 52 % (ref 43–77)
RDW: 16.1 % — ABNORMAL HIGH (ref 11.5–15.5)
WBC: 6.5 10*3/uL (ref 4.0–10.5)

## 2012-03-15 LAB — COMPREHENSIVE METABOLIC PANEL
ALT: 16 U/L (ref 0–35)
AST: 17 U/L (ref 0–37)
Albumin: 4.2 g/dL (ref 3.5–5.2)
Alkaline Phosphatase: 94 U/L (ref 39–117)
CO2: 29 mEq/L (ref 19–32)
Chloride: 100 mEq/L (ref 96–112)
Creatinine, Ser: 0.55 mg/dL (ref 0.50–1.10)
GFR calc non Af Amer: >90 mL/min (ref >90)
Potassium: 3.7 mEq/L (ref 3.5–5.1)
Total Bilirubin: 0.1 mg/dL — ABNORMAL LOW (ref 0.3–1.2)

## 2012-03-15 LAB — URINALYSIS, ROUTINE W REFLEX MICROSCOPIC
Ketones, ur: NEGATIVE mg/dL
Leukocytes, UA: NEGATIVE
Nitrite: NEGATIVE
Protein, ur: NEGATIVE mg/dL

## 2012-03-15 LAB — PREGNANCY, URINE: Preg Test, Ur: NEGATIVE

## 2012-03-15 LAB — RAPID URINE DRUG SCREEN, HOSP PERFORMED
Amphetamines: NOT DETECTED
Barbiturates: POSITIVE — AB

## 2012-03-15 MED ORDER — DIPHENHYDRAMINE HCL 50 MG/ML IJ SOLN
25.0000 mg | Freq: Once | INTRAMUSCULAR | Status: AC
  Administered 2012-03-15: 25 mg via INTRAVENOUS
  Filled 2012-03-15: qty 1

## 2012-03-15 MED ORDER — DIPHENHYDRAMINE HCL 50 MG/ML IJ SOLN: 50.0000 mg | Freq: Once | INTRAMUSCULAR | Status: DC

## 2012-03-15 MED ORDER — PROMETHAZINE HCL 25 MG RE SUPP: 25.0000 mg | Freq: Four times a day (QID) | RECTAL | Status: DC | PRN

## 2012-03-15 MED ORDER — METOCLOPRAMIDE HCL 5 MG/ML IJ SOLN
10.0000 mg | Freq: Once | INTRAMUSCULAR | Status: AC
  Administered 2012-03-15: 10 mg via INTRAVENOUS
  Filled 2012-03-15: qty 2

## 2012-03-15 MED ORDER — KETOROLAC TROMETHAMINE 30 MG/ML IJ SOLN
30.0000 mg | Freq: Once | INTRAMUSCULAR | Status: AC
  Administered 2012-03-15: 30 mg via INTRAVENOUS
  Filled 2012-03-15: qty 1

## 2012-03-15 MED ORDER — CYCLOBENZAPRINE HCL 10 MG PO TABS: 10.0000 mg | ORAL_TABLET | Freq: Three times a day (TID) | ORAL | Status: DC | PRN

## 2012-03-15 MED ORDER — ONDANSETRON 8 MG PO TBDP: 8.0000 mg | ORAL_TABLET | Freq: Three times a day (TID) | ORAL | Status: DC | PRN

## 2012-03-15 NOTE — ED Notes (Signed)
Upper abd pain, says she is being treated for pancreatitis.  N/V

## 2012-03-15 NOTE — ED Notes (Signed)
Nurse called to room due to pt stating she felt clammy, hot, and weird. EDP notified.
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
## 2012-03-21 ENCOUNTER — Encounter (HOSPITAL_COMMUNITY): Payer: Self-pay | Admitting: *Deleted

## 2012-03-21 ENCOUNTER — Emergency Department (HOSPITAL_COMMUNITY)
Admission: EM | Admit: 2012-03-21 | Discharge: 2012-03-21 | Disposition: A | Discharge status: Home / Self Care | Payer: Self-pay | Attending: Emergency Medicine | Admitting: Emergency Medicine

## 2012-03-21 DIAGNOSIS — R071 Chest pain on breathing: Secondary | ICD-10-CM | POA: Insufficient documentation

## 2012-03-21 DIAGNOSIS — M869 Osteomyelitis, unspecified: Secondary | ICD-10-CM | POA: Insufficient documentation

## 2012-03-21 DIAGNOSIS — K861 Other chronic pancreatitis: Secondary | ICD-10-CM | POA: Insufficient documentation

## 2012-03-21 DIAGNOSIS — F1220 Cannabis dependence, uncomplicated: Secondary | ICD-10-CM | POA: Insufficient documentation

## 2012-03-21 DIAGNOSIS — Z79899 Other long term (current) drug therapy: Secondary | ICD-10-CM | POA: Insufficient documentation

## 2012-03-21 DIAGNOSIS — F17200 Nicotine dependence, unspecified, uncomplicated: Secondary | ICD-10-CM | POA: Insufficient documentation

## 2012-03-21 DIAGNOSIS — I10 Essential (primary) hypertension: Secondary | ICD-10-CM | POA: Insufficient documentation

## 2012-03-21 DIAGNOSIS — F1120 Opioid dependence, uncomplicated: Secondary | ICD-10-CM | POA: Insufficient documentation

## 2012-03-21 DIAGNOSIS — B977 Papillomavirus as the cause of diseases classified elsewhere: Secondary | ICD-10-CM | POA: Insufficient documentation

## 2012-03-21 DIAGNOSIS — K219 Gastro-esophageal reflux disease without esophagitis: Secondary | ICD-10-CM | POA: Insufficient documentation

## 2012-03-21 DIAGNOSIS — R0789 Other chest pain: Secondary | ICD-10-CM

## 2012-03-21 MED ORDER — SODIUM CHLORIDE 0.9 % IV BOLUS (SEPSIS)
1000.0000 mL | Freq: Once | INTRAVENOUS | Status: AC
  Administered 2012-03-21: 1000 mL via INTRAVENOUS

## 2012-03-21 MED ORDER — OXYCODONE HCL 5 MG PO TABS: ORAL_TABLET | ORAL | Status: DC

## 2012-03-21 MED ORDER — HYDROMORPHONE HCL PF 1 MG/ML IJ SOLN
1.0000 mg | Freq: Once | INTRAMUSCULAR | Status: AC
  Administered 2012-03-21: 1 mg via INTRAVENOUS
  Filled 2012-03-21: qty 1

## 2012-03-21 MED ORDER — DIPHENHYDRAMINE HCL 50 MG/ML IJ SOLN
25.0000 mg | Freq: Once | INTRAMUSCULAR | Status: AC
  Administered 2012-03-21: 25 mg via INTRAVENOUS
  Filled 2012-03-21: qty 1

## 2012-03-21 MED ORDER — ONDANSETRON HCL 4 MG/2ML IJ SOLN
4.0000 mg | Freq: Once | INTRAMUSCULAR | Status: AC
  Administered 2012-03-21: 4 mg via INTRAVENOUS
  Filled 2012-03-21: qty 2

## 2012-03-21 NOTE — ED Notes (Signed)
Upper abd pain , hx of pancreatitis, nausea, vomited  X 1
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
## 2012-03-23 ENCOUNTER — Encounter (HOSPITAL_COMMUNITY): Payer: Self-pay | Admitting: Emergency Medicine

## 2012-03-23 ENCOUNTER — Emergency Department (HOSPITAL_COMMUNITY)
Admission: EM | Admit: 2012-03-23 | Discharge: 2012-03-23 | Disposition: A | Discharge status: Home / Self Care | Payer: Self-pay | Attending: Emergency Medicine | Admitting: Emergency Medicine

## 2012-03-23 DIAGNOSIS — K859 Acute pancreatitis without necrosis or infection, unspecified: Secondary | ICD-10-CM | POA: Insufficient documentation

## 2012-03-23 DIAGNOSIS — M869 Osteomyelitis, unspecified: Secondary | ICD-10-CM | POA: Insufficient documentation

## 2012-03-23 DIAGNOSIS — J029 Acute pharyngitis, unspecified: Secondary | ICD-10-CM | POA: Insufficient documentation

## 2012-03-23 DIAGNOSIS — F1120 Opioid dependence, uncomplicated: Secondary | ICD-10-CM | POA: Insufficient documentation

## 2012-03-23 DIAGNOSIS — R112 Nausea with vomiting, unspecified: Secondary | ICD-10-CM | POA: Insufficient documentation

## 2012-03-23 DIAGNOSIS — F411 Generalized anxiety disorder: Secondary | ICD-10-CM | POA: Insufficient documentation

## 2012-03-23 DIAGNOSIS — Z79899 Other long term (current) drug therapy: Secondary | ICD-10-CM | POA: Insufficient documentation

## 2012-03-23 DIAGNOSIS — Z8619 Personal history of other infectious and parasitic diseases: Secondary | ICD-10-CM | POA: Insufficient documentation

## 2012-03-23 DIAGNOSIS — F3289 Other specified depressive episodes: Secondary | ICD-10-CM | POA: Insufficient documentation

## 2012-03-23 DIAGNOSIS — F329 Major depressive disorder, single episode, unspecified: Secondary | ICD-10-CM | POA: Insufficient documentation

## 2012-03-23 DIAGNOSIS — F1210 Cannabis abuse, uncomplicated: Secondary | ICD-10-CM | POA: Insufficient documentation

## 2012-03-23 DIAGNOSIS — F17200 Nicotine dependence, unspecified, uncomplicated: Secondary | ICD-10-CM | POA: Insufficient documentation

## 2012-03-23 DIAGNOSIS — R1110 Vomiting, unspecified: Secondary | ICD-10-CM

## 2012-03-23 DIAGNOSIS — Z87828 Personal history of other (healed) physical injury and trauma: Secondary | ICD-10-CM | POA: Insufficient documentation

## 2012-03-23 DIAGNOSIS — K219 Gastro-esophageal reflux disease without esophagitis: Secondary | ICD-10-CM | POA: Insufficient documentation

## 2012-03-23 DIAGNOSIS — R509 Fever, unspecified: Secondary | ICD-10-CM | POA: Insufficient documentation

## 2012-03-23 LAB — COMPREHENSIVE METABOLIC PANEL
AST: 17 U/L (ref 0–37)
Albumin: 4.8 g/dL (ref 3.5–5.2)
BUN: 10 mg/dL (ref 6–23)
Calcium: 10.4 mg/dL (ref 8.4–10.5)
Chloride: 98 mEq/L (ref 96–112)
Creatinine, Ser: 0.59 mg/dL (ref 0.50–1.10)
GFR calc non Af Amer: >90 mL/min (ref >90)
Total Bilirubin: 0.5 mg/dL (ref 0.3–1.2)

## 2012-03-23 LAB — URINALYSIS, ROUTINE W REFLEX MICROSCOPIC
Glucose, UA: NEGATIVE mg/dL
Hgb urine dipstick: NEGATIVE
Protein, ur: NEGATIVE mg/dL
Specific Gravity, Urine: 1.025 (ref 1.005–1.030)
Urobilinogen, UA: 0.2 mg/dL (ref 0.0–1.0)

## 2012-03-23 LAB — CBC WITH DIFFERENTIAL/PLATELET
Basophils Absolute: 0 10*3/uL (ref 0.0–0.1)
Basophils Relative: 1 % (ref 0–1)
Eosinophils Relative: 2 % (ref 0–5)
HCT: 40.7 % (ref 36.0–46.0)
Hemoglobin: 13.9 g/dL (ref 12.0–15.0)
MCH: 29.9 pg (ref 26.0–34.0)
MCHC: 34.2 g/dL (ref 30.0–36.0)
MCV: 87.5 fL (ref 78.0–100.0)
Monocytes Absolute: 0.8 10*3/uL (ref 0.1–1.0)
Monocytes Relative: 9 % (ref 3–12)
RDW: 15.2 % (ref 11.5–15.5)

## 2012-03-23 LAB — LIPASE, BLOOD: Lipase: 30 U/L (ref 11–59)

## 2012-03-23 LAB — URINE MICROSCOPIC-ADD ON

## 2012-03-23 MED ORDER — ONDANSETRON HCL 4 MG/2ML IJ SOLN
4.0000 mg | Freq: Once | INTRAMUSCULAR | Status: AC
  Administered 2012-03-23: 4 mg via INTRAVENOUS
  Filled 2012-03-23: qty 2

## 2012-03-23 MED ORDER — HYDROMORPHONE HCL PF 1 MG/ML IJ SOLN
1.0000 mg | Freq: Once | INTRAMUSCULAR | Status: AC
  Administered 2012-03-23: 1 mg via INTRAVENOUS
  Filled 2012-03-23: qty 1

## 2012-03-23 NOTE — ED Notes (Signed)
Pt returns to Ed secondary to upper left abdominal pain that has worsened over past 2 days. Pt has long history of pancreatitis. Pt denies vomiting and fever. NAD noted at this time. Pt laughing and holding conversation with spouse. Pt refers some treatment at [facility] and has appointment Friday with said MD at said facility. Pt denies having PCp due to loss of insurance coverage.

## 2012-03-23 NOTE — ED Notes (Signed)
Epigastric pain, n/v, denies diarrhea. Started this am. States she has been clammy. Nad. Color wnl. Grimacing.
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
## 2012-03-24 LAB — URINE CULTURE: Culture: NO GROWTH

## 2012-03-26 ENCOUNTER — Encounter (HOSPITAL_COMMUNITY): Payer: Self-pay | Admitting: *Deleted

## 2012-03-26 ENCOUNTER — Emergency Department (HOSPITAL_COMMUNITY)
Admission: EM | Admit: 2012-03-26 | Discharge: 2012-03-26 | Disposition: A | Discharge status: Home / Self Care | Payer: Self-pay | Attending: Emergency Medicine | Admitting: Emergency Medicine

## 2012-03-26 DIAGNOSIS — Z9889 Other specified postprocedural states: Secondary | ICD-10-CM | POA: Insufficient documentation

## 2012-03-26 DIAGNOSIS — F1211 Cannabis abuse, in remission: Secondary | ICD-10-CM | POA: Insufficient documentation

## 2012-03-26 DIAGNOSIS — F3289 Other specified depressive episodes: Secondary | ICD-10-CM | POA: Insufficient documentation

## 2012-03-26 DIAGNOSIS — F329 Major depressive disorder, single episode, unspecified: Secondary | ICD-10-CM | POA: Insufficient documentation

## 2012-03-26 DIAGNOSIS — F411 Generalized anxiety disorder: Secondary | ICD-10-CM | POA: Insufficient documentation

## 2012-03-26 DIAGNOSIS — Z79899 Other long term (current) drug therapy: Secondary | ICD-10-CM | POA: Insufficient documentation

## 2012-03-26 DIAGNOSIS — F17200 Nicotine dependence, unspecified, uncomplicated: Secondary | ICD-10-CM | POA: Insufficient documentation

## 2012-03-26 DIAGNOSIS — Z8719 Personal history of other diseases of the digestive system: Secondary | ICD-10-CM | POA: Insufficient documentation

## 2012-03-26 DIAGNOSIS — F1110 Opioid abuse, uncomplicated: Secondary | ICD-10-CM | POA: Insufficient documentation

## 2012-03-26 DIAGNOSIS — Z8619 Personal history of other infectious and parasitic diseases: Secondary | ICD-10-CM | POA: Insufficient documentation

## 2012-03-26 DIAGNOSIS — K219 Gastro-esophageal reflux disease without esophagitis: Secondary | ICD-10-CM | POA: Insufficient documentation

## 2012-03-26 DIAGNOSIS — G8929 Other chronic pain: Secondary | ICD-10-CM | POA: Insufficient documentation

## 2012-03-26 DIAGNOSIS — I10 Essential (primary) hypertension: Secondary | ICD-10-CM | POA: Insufficient documentation

## 2012-03-26 DIAGNOSIS — R109 Unspecified abdominal pain: Secondary | ICD-10-CM | POA: Insufficient documentation

## 2012-03-26 MED ORDER — HYDROMORPHONE HCL PF 1 MG/ML IJ SOLN
1.0000 mg | Freq: Once | INTRAMUSCULAR | Status: AC
  Administered 2012-03-26: 1 mg via INTRAMUSCULAR
  Filled 2012-03-26: qty 1

## 2012-03-26 MED ORDER — OXYCODONE HCL 5 MG PO TABS: 5.0000 mg | ORAL_TABLET | Freq: Four times a day (QID) | ORAL | Status: DC | PRN

## 2012-03-26 NOTE — ED Notes (Signed)
Pt has point tenderness upper central abdomen approximately where she received the injections. Pt denies n/v/d.
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
## 2012-03-26 NOTE — ED Notes (Signed)
MD at bedside. 

## 2012-03-26 NOTE — ED Notes (Signed)
Pt's ride here.

## 2012-03-26 NOTE — ED Notes (Signed)
Discharge instructions reviewed with pt, questions answered. Pt verbalized understanding.  

## 2012-04-02 ENCOUNTER — Emergency Department (HOSPITAL_COMMUNITY)
Admission: EM | Admit: 2012-04-02 | Discharge: 2012-04-02 | Disposition: A | Discharge status: Home / Self Care | Payer: Self-pay | Attending: Emergency Medicine | Admitting: Emergency Medicine

## 2012-04-02 ENCOUNTER — Encounter (HOSPITAL_COMMUNITY): Payer: Self-pay | Admitting: Emergency Medicine

## 2012-04-02 DIAGNOSIS — F17200 Nicotine dependence, unspecified, uncomplicated: Secondary | ICD-10-CM | POA: Insufficient documentation

## 2012-04-02 DIAGNOSIS — Z79899 Other long term (current) drug therapy: Secondary | ICD-10-CM | POA: Insufficient documentation

## 2012-04-02 DIAGNOSIS — Z8619 Personal history of other infectious and parasitic diseases: Secondary | ICD-10-CM | POA: Insufficient documentation

## 2012-04-02 DIAGNOSIS — Z8739 Personal history of other diseases of the musculoskeletal system and connective tissue: Secondary | ICD-10-CM | POA: Insufficient documentation

## 2012-04-02 DIAGNOSIS — Z8659 Personal history of other mental and behavioral disorders: Secondary | ICD-10-CM | POA: Insufficient documentation

## 2012-04-02 DIAGNOSIS — F1120 Opioid dependence, uncomplicated: Secondary | ICD-10-CM | POA: Insufficient documentation

## 2012-04-02 DIAGNOSIS — I10 Essential (primary) hypertension: Secondary | ICD-10-CM | POA: Insufficient documentation

## 2012-04-02 DIAGNOSIS — G8929 Other chronic pain: Secondary | ICD-10-CM | POA: Insufficient documentation

## 2012-04-02 DIAGNOSIS — K861 Other chronic pancreatitis: Secondary | ICD-10-CM | POA: Insufficient documentation

## 2012-04-02 DIAGNOSIS — F411 Generalized anxiety disorder: Secondary | ICD-10-CM | POA: Insufficient documentation

## 2012-04-02 DIAGNOSIS — K219 Gastro-esophageal reflux disease without esophagitis: Secondary | ICD-10-CM | POA: Insufficient documentation

## 2012-04-02 DIAGNOSIS — F1210 Cannabis abuse, uncomplicated: Secondary | ICD-10-CM | POA: Insufficient documentation

## 2012-04-02 DIAGNOSIS — R112 Nausea with vomiting, unspecified: Secondary | ICD-10-CM | POA: Insufficient documentation

## 2012-04-02 MED ORDER — PROMETHAZINE HCL 25 MG/ML IJ SOLN
25.0000 mg | Freq: Once | INTRAMUSCULAR | Status: AC
  Administered 2012-04-02: 25 mg via INTRAMUSCULAR
  Filled 2012-04-02: qty 1
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
## 2012-04-02 NOTE — ED Notes (Signed)
Sprite given 

## 2012-04-02 NOTE — ED Notes (Signed)
Pt c/o abd pain with n/v since 1300

## 2012-04-04 ENCOUNTER — Encounter (HOSPITAL_COMMUNITY): Payer: Self-pay | Admitting: Emergency Medicine

## 2012-04-04 ENCOUNTER — Emergency Department (HOSPITAL_COMMUNITY)
Admission: EM | Admit: 2012-04-04 | Discharge: 2012-04-04 | Disposition: A | Discharge status: Home / Self Care | Payer: Self-pay | Attending: Emergency Medicine | Admitting: Emergency Medicine

## 2012-04-04 DIAGNOSIS — I10 Essential (primary) hypertension: Secondary | ICD-10-CM | POA: Insufficient documentation

## 2012-04-04 DIAGNOSIS — R112 Nausea with vomiting, unspecified: Secondary | ICD-10-CM | POA: Insufficient documentation

## 2012-04-04 DIAGNOSIS — Z79899 Other long term (current) drug therapy: Secondary | ICD-10-CM | POA: Insufficient documentation

## 2012-04-04 DIAGNOSIS — K219 Gastro-esophageal reflux disease without esophagitis: Secondary | ICD-10-CM | POA: Insufficient documentation

## 2012-04-04 DIAGNOSIS — K861 Other chronic pancreatitis: Secondary | ICD-10-CM | POA: Insufficient documentation

## 2012-04-04 DIAGNOSIS — F1210 Cannabis abuse, uncomplicated: Secondary | ICD-10-CM | POA: Insufficient documentation

## 2012-04-04 DIAGNOSIS — M869 Osteomyelitis, unspecified: Secondary | ICD-10-CM | POA: Insufficient documentation

## 2012-04-04 DIAGNOSIS — F329 Major depressive disorder, single episode, unspecified: Secondary | ICD-10-CM | POA: Insufficient documentation

## 2012-04-04 DIAGNOSIS — R8789 Other abnormal findings in specimens from female genital organs: Secondary | ICD-10-CM | POA: Insufficient documentation

## 2012-04-04 DIAGNOSIS — F17200 Nicotine dependence, unspecified, uncomplicated: Secondary | ICD-10-CM | POA: Insufficient documentation

## 2012-04-04 DIAGNOSIS — F1110 Opioid abuse, uncomplicated: Secondary | ICD-10-CM | POA: Insufficient documentation

## 2012-04-04 DIAGNOSIS — F3289 Other specified depressive episodes: Secondary | ICD-10-CM | POA: Insufficient documentation

## 2012-04-04 DIAGNOSIS — R1013 Epigastric pain: Secondary | ICD-10-CM | POA: Insufficient documentation

## 2012-04-04 DIAGNOSIS — F411 Generalized anxiety disorder: Secondary | ICD-10-CM | POA: Insufficient documentation

## 2012-04-04 DIAGNOSIS — Z9889 Other specified postprocedural states: Secondary | ICD-10-CM | POA: Insufficient documentation

## 2012-04-04 MED ORDER — SODIUM CHLORIDE 0.9 % IV SOLN
Freq: Once | INTRAVENOUS | Status: AC
  Administered 2012-04-04: 20 mL/h via INTRAVENOUS

## 2012-04-04 MED ORDER — ONDANSETRON HCL 4 MG/2ML IJ SOLN
4.0000 mg | Freq: Once | INTRAMUSCULAR | Status: AC
  Administered 2012-04-04: 4 mg via INTRAVENOUS
  Filled 2012-04-04: qty 2

## 2012-04-04 MED ORDER — HYDROMORPHONE HCL PF 2 MG/ML IJ SOLN
2.0000 mg | Freq: Once | INTRAMUSCULAR | Status: AC
  Administered 2012-04-04: 2 mg via INTRAVENOUS
  Filled 2012-04-04: qty 1

## 2012-04-04 MED ORDER — DIPHENHYDRAMINE HCL 50 MG/ML IJ SOLN
25.0000 mg | Freq: Once | INTRAMUSCULAR | Status: AC
  Administered 2012-04-04: 25 mg via INTRAVENOUS
  Filled 2012-04-04: qty 1

## 2012-04-04 MED ORDER — DIPHENHYDRAMINE HCL 25 MG PO CAPS: 25.0000 mg | ORAL_CAPSULE | Freq: Once | ORAL | Status: DC

## 2012-04-04 NOTE — ED Notes (Addendum)
Pt discharged. Pt stable at time of discharge. pt has no questions regarding discharge at this time. Pt voiced understanding of discharge instructions. Pt instructed to keep her appointments.
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
## 2012-04-04 NOTE — ED Notes (Signed)
C/o increased nausea - no emesis noted at present, anxious.  Significant other at bedside with patient

## 2012-04-04 NOTE — ED Notes (Signed)
Patient has history of pancreatitis; had steroid shots placed in abdomen 2 weeks ago.  Patient states had abdominal pain that started yesterday and began going into her chest and causing shortness of breath about an hour ago.

## 2012-04-06 ENCOUNTER — Emergency Department (HOSPITAL_COMMUNITY)
Admission: EM | Admit: 2012-04-06 | Discharge: 2012-04-06 | Disposition: A | Discharge status: Home / Self Care | Payer: Self-pay | Attending: Emergency Medicine | Admitting: Emergency Medicine

## 2012-04-06 ENCOUNTER — Encounter (HOSPITAL_COMMUNITY): Payer: Self-pay | Admitting: *Deleted

## 2012-04-06 DIAGNOSIS — K219 Gastro-esophageal reflux disease without esophagitis: Secondary | ICD-10-CM | POA: Insufficient documentation

## 2012-04-06 DIAGNOSIS — I10 Essential (primary) hypertension: Secondary | ICD-10-CM | POA: Insufficient documentation

## 2012-04-06 DIAGNOSIS — Z8739 Personal history of other diseases of the musculoskeletal system and connective tissue: Secondary | ICD-10-CM | POA: Insufficient documentation

## 2012-04-06 DIAGNOSIS — K861 Other chronic pancreatitis: Secondary | ICD-10-CM | POA: Insufficient documentation

## 2012-04-06 DIAGNOSIS — Z79899 Other long term (current) drug therapy: Secondary | ICD-10-CM | POA: Insufficient documentation

## 2012-04-06 DIAGNOSIS — F329 Major depressive disorder, single episode, unspecified: Secondary | ICD-10-CM | POA: Insufficient documentation

## 2012-04-06 DIAGNOSIS — R112 Nausea with vomiting, unspecified: Secondary | ICD-10-CM | POA: Insufficient documentation

## 2012-04-06 DIAGNOSIS — F3289 Other specified depressive episodes: Secondary | ICD-10-CM | POA: Insufficient documentation

## 2012-04-06 DIAGNOSIS — F411 Generalized anxiety disorder: Secondary | ICD-10-CM | POA: Insufficient documentation

## 2012-04-06 DIAGNOSIS — F17200 Nicotine dependence, unspecified, uncomplicated: Secondary | ICD-10-CM | POA: Insufficient documentation

## 2012-04-06 DIAGNOSIS — G8929 Other chronic pain: Secondary | ICD-10-CM | POA: Insufficient documentation

## 2012-04-06 DIAGNOSIS — Q453 Other congenital malformations of pancreas and pancreatic duct: Secondary | ICD-10-CM

## 2012-04-06 DIAGNOSIS — R109 Unspecified abdominal pain: Secondary | ICD-10-CM | POA: Insufficient documentation

## 2012-04-06 DIAGNOSIS — Z8619 Personal history of other infectious and parasitic diseases: Secondary | ICD-10-CM | POA: Insufficient documentation

## 2012-04-06 LAB — URINALYSIS, ROUTINE W REFLEX MICROSCOPIC
Glucose, UA: NEGATIVE mg/dL
Ketones, ur: NEGATIVE mg/dL
Leukocytes, UA: NEGATIVE
Nitrite: NEGATIVE
Specific Gravity, Urine: >1.03 — ABNORMAL HIGH (ref 1.005–1.030)
pH: 5.5 (ref 5.0–8.0)

## 2012-04-06 LAB — COMPREHENSIVE METABOLIC PANEL
AST: 13 U/L (ref 0–37)
Albumin: 4.1 g/dL (ref 3.5–5.2)
Alkaline Phosphatase: 77 U/L (ref 39–117)
Chloride: 102 mEq/L (ref 96–112)
Creatinine, Ser: 0.71 mg/dL (ref 0.50–1.10)
Potassium: 3.7 mEq/L (ref 3.5–5.1)
Total Bilirubin: 0.1 mg/dL — ABNORMAL LOW (ref 0.3–1.2)
Total Protein: 7.5 g/dL (ref 6.0–8.3)

## 2012-04-06 LAB — CBC WITH DIFFERENTIAL/PLATELET
Basophils Absolute: 0 10*3/uL (ref 0.0–0.1)
Basophils Relative: 1 % (ref 0–1)
Eosinophils Absolute: 0.2 10*3/uL (ref 0.0–0.7)
MCHC: 34.3 g/dL (ref 30.0–36.0)
Neutro Abs: 3.8 10*3/uL (ref 1.7–7.7)
Neutrophils Relative %: 47 % (ref 43–77)
RDW: 15.5 % (ref 11.5–15.5)

## 2012-04-06 MED ORDER — PROMETHAZINE HCL 25 MG/ML IJ SOLN
25.0000 mg | Freq: Once | INTRAMUSCULAR | Status: DC
  Filled 2012-04-06: qty 1

## 2012-04-06 MED ORDER — ONDANSETRON HCL 4 MG/2ML IJ SOLN
INTRAMUSCULAR | Status: AC
  Filled 2012-04-06: qty 2

## 2012-04-06 MED ORDER — OXYCODONE HCL 5 MG PO TABS
10.0000 mg | ORAL_TABLET | Freq: Once | ORAL | Status: AC
  Administered 2012-04-06: 10 mg via ORAL
  Filled 2012-04-06: qty 2

## 2012-04-06 MED ORDER — HYDROMORPHONE HCL PF 1 MG/ML IJ SOLN
1.5000 mg | Freq: Once | INTRAMUSCULAR | Status: AC
  Administered 2012-04-06: 1.5 mg via INTRAVENOUS
  Filled 2012-04-06: qty 2

## 2012-04-06 MED ORDER — ONDANSETRON HCL 4 MG/2ML IJ SOLN
4.0000 mg | Freq: Once | INTRAMUSCULAR | Status: AC
  Administered 2012-04-06: 4 mg via INTRAVENOUS

## 2012-04-06 MED ORDER — ONDANSETRON HCL 4 MG/2ML IJ SOLN
4.0000 mg | INTRAMUSCULAR | Status: AC
  Administered 2012-04-06: 4 mg via INTRAVENOUS
  Filled 2012-04-06: qty 2

## 2012-04-06 MED ORDER — HYDROMORPHONE HCL PF 2 MG/ML IJ SOLN
2.0000 mg | Freq: Once | INTRAMUSCULAR | Status: AC
  Administered 2012-04-06: 2 mg via INTRAVENOUS
  Filled 2012-04-06: qty 1

## 2012-04-06 MED ORDER — DIPHENHYDRAMINE HCL 50 MG/ML IJ SOLN
25.0000 mg | Freq: Once | INTRAMUSCULAR | Status: AC
  Administered 2012-04-06: 25 mg via INTRAVENOUS
  Filled 2012-04-06: qty 1

## 2012-04-06 MED ORDER — LACTATED RINGERS IV BOLUS (SEPSIS)
2000.0000 mL | Freq: Once | INTRAVENOUS | Status: AC
  Administered 2012-04-06: 1000 mL via INTRAVENOUS

## 2012-04-06 NOTE — ED Notes (Signed)
Upper abd pain that pt says is due to pancreatitis.  N/V, No fever.  No diarrhea

## 2012-04-06 NOTE — ED Notes (Signed)
Pt notes improvement in nausea. Able to take PO medications
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
## 2012-04-06 NOTE — ED Notes (Signed)
Pt experiencing abdominal pain due to pancreatitis flare ups. States she has been getting doses of injected steroids and managing her pain with oxycodone, States she is now out of her medications and pain has gotten worse.

## 2012-04-06 NOTE — ED Notes (Signed)
Pt complaining of increased nausea and pain. Dr Rulon Abide notified and verbal order for 4mg  zofran and 1.5 mg dilaudid

## 2012-04-07 NOTE — Progress Notes (Signed)
UR Chart Review Completed  

## 2012-04-08 ENCOUNTER — Emergency Department (HOSPITAL_COMMUNITY)
Admission: EM | Admit: 2012-04-08 | Discharge: 2012-04-08 | Disposition: A | Discharge status: Home / Self Care | Payer: Self-pay | Attending: Emergency Medicine | Admitting: Emergency Medicine

## 2012-04-08 ENCOUNTER — Emergency Department (HOSPITAL_COMMUNITY): Payer: Self-pay

## 2012-04-08 ENCOUNTER — Encounter (HOSPITAL_COMMUNITY): Payer: Self-pay | Admitting: *Deleted

## 2012-04-08 DIAGNOSIS — Z79899 Other long term (current) drug therapy: Secondary | ICD-10-CM | POA: Insufficient documentation

## 2012-04-08 DIAGNOSIS — Z3202 Encounter for pregnancy test, result negative: Secondary | ICD-10-CM | POA: Insufficient documentation

## 2012-04-08 DIAGNOSIS — G8929 Other chronic pain: Secondary | ICD-10-CM | POA: Insufficient documentation

## 2012-04-08 DIAGNOSIS — F1120 Opioid dependence, uncomplicated: Secondary | ICD-10-CM | POA: Insufficient documentation

## 2012-04-08 DIAGNOSIS — Z8719 Personal history of other diseases of the digestive system: Secondary | ICD-10-CM | POA: Insufficient documentation

## 2012-04-08 DIAGNOSIS — F609 Personality disorder, unspecified: Secondary | ICD-10-CM | POA: Insufficient documentation

## 2012-04-08 DIAGNOSIS — K219 Gastro-esophageal reflux disease without esophagitis: Secondary | ICD-10-CM | POA: Insufficient documentation

## 2012-04-08 DIAGNOSIS — F3289 Other specified depressive episodes: Secondary | ICD-10-CM | POA: Insufficient documentation

## 2012-04-08 DIAGNOSIS — F411 Generalized anxiety disorder: Secondary | ICD-10-CM | POA: Insufficient documentation

## 2012-04-08 DIAGNOSIS — F17200 Nicotine dependence, unspecified, uncomplicated: Secondary | ICD-10-CM | POA: Insufficient documentation

## 2012-04-08 DIAGNOSIS — F329 Major depressive disorder, single episode, unspecified: Secondary | ICD-10-CM | POA: Insufficient documentation

## 2012-04-08 DIAGNOSIS — Z8739 Personal history of other diseases of the musculoskeletal system and connective tissue: Secondary | ICD-10-CM | POA: Insufficient documentation

## 2012-04-08 DIAGNOSIS — R1013 Epigastric pain: Secondary | ICD-10-CM | POA: Insufficient documentation

## 2012-04-08 DIAGNOSIS — I10 Essential (primary) hypertension: Secondary | ICD-10-CM | POA: Insufficient documentation

## 2012-04-08 LAB — CBC WITH DIFFERENTIAL/PLATELET
Basophils Relative: 0 % (ref 0–1)
HCT: 39.1 % (ref 36.0–46.0)
Hemoglobin: 13.3 g/dL (ref 12.0–15.0)
Lymphocytes Relative: 38 % (ref 12–46)
Lymphs Abs: 3.1 10*3/uL (ref 0.7–4.0)
MCHC: 34 g/dL (ref 30.0–36.0)
Monocytes Absolute: 0.7 10*3/uL (ref 0.1–1.0)
Monocytes Relative: 8 % (ref 3–12)
Neutro Abs: 4.2 10*3/uL (ref 1.7–7.7)
Neutrophils Relative %: 51 % (ref 43–77)
RBC: 4.39 MIL/uL (ref 3.87–5.11)

## 2012-04-08 LAB — LIPASE, BLOOD: Lipase: 29 U/L (ref 11–59)

## 2012-04-08 LAB — COMPREHENSIVE METABOLIC PANEL
Albumin: 4.4 g/dL (ref 3.5–5.2)
Alkaline Phosphatase: 82 U/L (ref 39–117)
BUN: 9 mg/dL (ref 6–23)
CO2: 29 mEq/L (ref 19–32)
Chloride: 102 mEq/L (ref 96–112)
Creatinine, Ser: 0.68 mg/dL (ref 0.50–1.10)
GFR calc non Af Amer: >90 mL/min (ref >90)
Glucose, Bld: 125 mg/dL — ABNORMAL HIGH (ref 70–99)
Potassium: 4.1 mEq/L (ref 3.5–5.1)
Total Bilirubin: 0.2 mg/dL — ABNORMAL LOW (ref 0.3–1.2)

## 2012-04-08 LAB — URINALYSIS, ROUTINE W REFLEX MICROSCOPIC
Bilirubin Urine: NEGATIVE
Ketones, ur: NEGATIVE mg/dL
Leukocytes, UA: NEGATIVE
Nitrite: NEGATIVE
Protein, ur: NEGATIVE mg/dL
pH: 8 (ref 5.0–8.0)

## 2012-04-08 MED ORDER — LORAZEPAM 2 MG/ML IJ SOLN
1.0000 mg | Freq: Once | INTRAMUSCULAR | Status: AC
  Administered 2012-04-08: 1 mg via INTRAVENOUS
  Filled 2012-04-08: qty 1

## 2012-04-08 MED ORDER — ONDANSETRON HCL 4 MG/2ML IJ SOLN
4.0000 mg | Freq: Once | INTRAMUSCULAR | Status: AC
  Administered 2012-04-08: 4 mg via INTRAVENOUS
  Filled 2012-04-08: qty 2

## 2012-04-08 MED ORDER — HYDROMORPHONE HCL PF 1 MG/ML IJ SOLN
1.0000 mg | Freq: Once | INTRAMUSCULAR | Status: AC
  Administered 2012-04-08: 1 mg via INTRAVENOUS
  Filled 2012-04-08: qty 1

## 2012-04-08 MED ORDER — SODIUM CHLORIDE 0.9 % IV BOLUS (SEPSIS)
1000.0000 mL | Freq: Once | INTRAVENOUS | Status: AC
  Administered 2012-04-08: 1000 mL via INTRAVENOUS

## 2012-04-08 NOTE — ED Notes (Signed)
Epigastric pain that started around 1230 today.  C/o n/v.
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
## 2012-04-09 ENCOUNTER — Emergency Department (HOSPITAL_COMMUNITY)
Admission: EM | Admit: 2012-04-09 | Discharge: 2012-04-09 | Disposition: A | Discharge status: Home / Self Care | Payer: Self-pay | Attending: Emergency Medicine | Admitting: Emergency Medicine

## 2012-04-09 ENCOUNTER — Encounter (HOSPITAL_COMMUNITY): Payer: Self-pay | Admitting: Emergency Medicine

## 2012-04-09 DIAGNOSIS — Z9889 Other specified postprocedural states: Secondary | ICD-10-CM | POA: Insufficient documentation

## 2012-04-09 DIAGNOSIS — Z79899 Other long term (current) drug therapy: Secondary | ICD-10-CM | POA: Insufficient documentation

## 2012-04-09 DIAGNOSIS — K219 Gastro-esophageal reflux disease without esophagitis: Secondary | ICD-10-CM | POA: Insufficient documentation

## 2012-04-09 DIAGNOSIS — Z8739 Personal history of other diseases of the musculoskeletal system and connective tissue: Secondary | ICD-10-CM | POA: Insufficient documentation

## 2012-04-09 DIAGNOSIS — I10 Essential (primary) hypertension: Secondary | ICD-10-CM | POA: Insufficient documentation

## 2012-04-09 DIAGNOSIS — F3289 Other specified depressive episodes: Secondary | ICD-10-CM | POA: Insufficient documentation

## 2012-04-09 DIAGNOSIS — R112 Nausea with vomiting, unspecified: Secondary | ICD-10-CM

## 2012-04-09 DIAGNOSIS — R1013 Epigastric pain: Secondary | ICD-10-CM | POA: Insufficient documentation

## 2012-04-09 DIAGNOSIS — R109 Unspecified abdominal pain: Secondary | ICD-10-CM

## 2012-04-09 DIAGNOSIS — Z8719 Personal history of other diseases of the digestive system: Secondary | ICD-10-CM | POA: Insufficient documentation

## 2012-04-09 DIAGNOSIS — F17200 Nicotine dependence, unspecified, uncomplicated: Secondary | ICD-10-CM | POA: Insufficient documentation

## 2012-04-09 DIAGNOSIS — F411 Generalized anxiety disorder: Secondary | ICD-10-CM | POA: Insufficient documentation

## 2012-04-09 DIAGNOSIS — F1210 Cannabis abuse, uncomplicated: Secondary | ICD-10-CM | POA: Insufficient documentation

## 2012-04-09 DIAGNOSIS — F1120 Opioid dependence, uncomplicated: Secondary | ICD-10-CM | POA: Insufficient documentation

## 2012-04-09 DIAGNOSIS — Z8619 Personal history of other infectious and parasitic diseases: Secondary | ICD-10-CM | POA: Insufficient documentation

## 2012-04-09 DIAGNOSIS — F329 Major depressive disorder, single episode, unspecified: Secondary | ICD-10-CM | POA: Insufficient documentation

## 2012-04-09 LAB — URINALYSIS, ROUTINE W REFLEX MICROSCOPIC
Ketones, ur: NEGATIVE mg/dL
Leukocytes, UA: NEGATIVE
Nitrite: NEGATIVE
Specific Gravity, Urine: 1.02 (ref 1.005–1.030)
pH: 7 (ref 5.0–8.0)

## 2012-04-09 LAB — CBC WITH DIFFERENTIAL/PLATELET
Basophils Absolute: 0 10*3/uL (ref 0.0–0.1)
Basophils Relative: 0 % (ref 0–1)
Eosinophils Absolute: 0.1 10*3/uL (ref 0.0–0.7)
Eosinophils Relative: 2 % (ref 0–5)
HCT: 39.8 % (ref 36.0–46.0)
Hemoglobin: 13.5 g/dL (ref 12.0–15.0)
MCH: 30.1 pg (ref 26.0–34.0)
MCHC: 33.9 g/dL (ref 30.0–36.0)
Monocytes Absolute: 0.6 10*3/uL (ref 0.1–1.0)
Monocytes Relative: 9 % (ref 3–12)
RDW: 15.5 % (ref 11.5–15.5)

## 2012-04-09 LAB — COMPREHENSIVE METABOLIC PANEL
AST: 14 U/L (ref 0–37)
Albumin: 4.4 g/dL (ref 3.5–5.2)
BUN: 4 mg/dL — ABNORMAL LOW (ref 6–23)
Calcium: 10.1 mg/dL (ref 8.4–10.5)
Creatinine, Ser: 0.58 mg/dL (ref 0.50–1.10)
Total Bilirubin: 0.3 mg/dL (ref 0.3–1.2)
Total Protein: 7.8 g/dL (ref 6.0–8.3)

## 2012-04-09 LAB — LIPASE, BLOOD: Lipase: 24 U/L (ref 11–59)

## 2012-04-09 MED ORDER — ONDANSETRON HCL 4 MG/2ML IJ SOLN
4.0000 mg | Freq: Once | INTRAMUSCULAR | Status: AC
  Administered 2012-04-09: 4 mg via INTRAVENOUS
  Filled 2012-04-09: qty 2

## 2012-04-09 MED ORDER — HYDROMORPHONE HCL PF 1 MG/ML IJ SOLN
2.0000 mg | Freq: Once | INTRAMUSCULAR | Status: AC
  Administered 2012-04-09: 2 mg via INTRAVENOUS
  Filled 2012-04-09: qty 2

## 2012-04-09 MED ORDER — PROMETHAZINE HCL 25 MG RE SUPP: 25.0000 mg | Freq: Four times a day (QID) | RECTAL | Status: DC | PRN

## 2012-04-09 MED ORDER — PROMETHAZINE HCL 25 MG PO TABS: 25.0000 mg | ORAL_TABLET | Freq: Four times a day (QID) | ORAL | Status: DC | PRN

## 2012-04-09 MED ORDER — SODIUM CHLORIDE 0.9 % IV BOLUS (SEPSIS)
1000.0000 mL | Freq: Once | INTRAVENOUS | Status: AC
  Administered 2012-04-09: 1000 mL via INTRAVENOUS

## 2012-04-09 MED ORDER — DIPHENHYDRAMINE HCL 50 MG/ML IJ SOLN
25.0000 mg | Freq: Once | INTRAMUSCULAR | Status: AC
  Administered 2012-04-09: 25 mg via INTRAVENOUS
  Filled 2012-04-09: qty 1

## 2012-04-09 NOTE — ED Notes (Signed)
Pt here yesterday for same. N/v. abd pain to epigastric area. Nad. Mm moist.

## 2012-04-09 NOTE — ED Notes (Signed)
Pt states she ran out of percocet for pain. Was told she could not have home Rx here because it may "mess up pain mgt at [facility]" Pt states "I was told to just come back here to manage my pain until my next appt at [facility] on Jan 6th."
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
## 2012-04-10 ENCOUNTER — Emergency Department (HOSPITAL_COMMUNITY)
Admission: EM | Admit: 2012-04-10 | Discharge: 2012-04-11 | Disposition: A | Discharge status: Home / Self Care | Payer: Self-pay | Attending: Emergency Medicine | Admitting: Emergency Medicine

## 2012-04-10 ENCOUNTER — Encounter (HOSPITAL_COMMUNITY): Payer: Self-pay | Admitting: *Deleted

## 2012-04-10 DIAGNOSIS — M940 Chondrocostal junction syndrome [Tietze]: Secondary | ICD-10-CM | POA: Insufficient documentation

## 2012-04-10 DIAGNOSIS — Z8739 Personal history of other diseases of the musculoskeletal system and connective tissue: Secondary | ICD-10-CM | POA: Insufficient documentation

## 2012-04-10 DIAGNOSIS — I10 Essential (primary) hypertension: Secondary | ICD-10-CM | POA: Insufficient documentation

## 2012-04-10 DIAGNOSIS — K219 Gastro-esophageal reflux disease without esophagitis: Secondary | ICD-10-CM | POA: Insufficient documentation

## 2012-04-10 DIAGNOSIS — R112 Nausea with vomiting, unspecified: Secondary | ICD-10-CM | POA: Insufficient documentation

## 2012-04-10 DIAGNOSIS — Z79899 Other long term (current) drug therapy: Secondary | ICD-10-CM | POA: Insufficient documentation

## 2012-04-10 DIAGNOSIS — F1110 Opioid abuse, uncomplicated: Secondary | ICD-10-CM | POA: Insufficient documentation

## 2012-04-10 DIAGNOSIS — F1210 Cannabis abuse, uncomplicated: Secondary | ICD-10-CM | POA: Insufficient documentation

## 2012-04-10 DIAGNOSIS — F17200 Nicotine dependence, unspecified, uncomplicated: Secondary | ICD-10-CM | POA: Insufficient documentation

## 2012-04-10 DIAGNOSIS — Z8619 Personal history of other infectious and parasitic diseases: Secondary | ICD-10-CM | POA: Insufficient documentation

## 2012-04-10 DIAGNOSIS — Z765 Malingerer [conscious simulation]: Secondary | ICD-10-CM | POA: Insufficient documentation

## 2012-04-10 DIAGNOSIS — R109 Unspecified abdominal pain: Secondary | ICD-10-CM | POA: Insufficient documentation

## 2012-04-10 LAB — COMPREHENSIVE METABOLIC PANEL
AST: 16 U/L (ref 0–37)
Albumin: 4.7 g/dL (ref 3.5–5.2)
Alkaline Phosphatase: 86 U/L (ref 39–117)
CO2: 25 mEq/L (ref 19–32)
Chloride: 99 mEq/L (ref 96–112)
Potassium: 4.1 mEq/L (ref 3.5–5.1)
Total Bilirubin: 0.2 mg/dL — ABNORMAL LOW (ref 0.3–1.2)

## 2012-04-10 LAB — CBC WITH DIFFERENTIAL/PLATELET
Basophils Absolute: 0 10*3/uL (ref 0.0–0.1)
Basophils Relative: 0 % (ref 0–1)
Hemoglobin: 13.7 g/dL (ref 12.0–15.0)
Lymphocytes Relative: 26 % (ref 12–46)
MCHC: 33.7 g/dL (ref 30.0–36.0)
Monocytes Relative: 7 % (ref 3–12)
Neutro Abs: 6.7 10*3/uL (ref 1.7–7.7)
Neutrophils Relative %: 65 % (ref 43–77)
RDW: 15.6 % — ABNORMAL HIGH (ref 11.5–15.5)
WBC: 10.4 10*3/uL (ref 4.0–10.5)

## 2012-04-10 MED ORDER — ONDANSETRON HCL 4 MG/2ML IJ SOLN
4.0000 mg | Freq: Once | INTRAMUSCULAR | Status: AC
  Administered 2012-04-10: 4 mg via INTRAVENOUS
  Filled 2012-04-10: qty 2

## 2012-04-10 MED ORDER — SODIUM CHLORIDE 0.9 % IV SOLN
1000.0000 mL | Freq: Once | INTRAVENOUS | Status: AC
  Administered 2012-04-10: 1000 mL via INTRAVENOUS

## 2012-04-10 MED ORDER — KETOROLAC TROMETHAMINE 30 MG/ML IJ SOLN
30.0000 mg | Freq: Once | INTRAMUSCULAR | Status: AC
  Administered 2012-04-10: 30 mg via INTRAVENOUS
  Filled 2012-04-10: qty 1

## 2012-04-10 MED ORDER — SODIUM CHLORIDE 0.9 % IV SOLN
1000.0000 mL | INTRAVENOUS | Status: DC
  Administered 2012-04-10: 1000 mL via INTRAVENOUS

## 2012-04-10 MED ORDER — DIPHENHYDRAMINE HCL 50 MG/ML IJ SOLN
50.0000 mg | Freq: Once | INTRAMUSCULAR | Status: AC
  Administered 2012-04-10: 50 mg via INTRAVENOUS
  Filled 2012-04-10: qty 1

## 2012-04-10 NOTE — ED Notes (Signed)
abd pain, n/v and chest discomfort.  For 2 days.

## 2012-04-10 NOTE — ED Notes (Signed)
Patient complains of continued pain; states pain goes from epigastric area straight through the top of her back.  Dr. Lynelle Doctor made aware.
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
## 2012-04-11 MED ORDER — CYCLOBENZAPRINE HCL 10 MG PO TABS: 10.0000 mg | ORAL_TABLET | Freq: Three times a day (TID) | ORAL | Status: DC | PRN

## 2012-04-11 MED ORDER — CYCLOBENZAPRINE HCL 10 MG PO TABS
10.0000 mg | ORAL_TABLET | Freq: Once | ORAL | Status: AC
  Administered 2012-04-11: 10 mg via ORAL
  Filled 2012-04-11: qty 1

## 2012-04-13 ENCOUNTER — Emergency Department (HOSPITAL_COMMUNITY)
Admission: EM | Admit: 2012-04-13 | Discharge: 2012-04-13 | Disposition: A | Discharge status: Home / Self Care | Payer: Self-pay | Attending: Emergency Medicine | Admitting: Emergency Medicine

## 2012-04-13 ENCOUNTER — Encounter (HOSPITAL_COMMUNITY): Payer: Self-pay

## 2012-04-13 DIAGNOSIS — I10 Essential (primary) hypertension: Secondary | ICD-10-CM | POA: Insufficient documentation

## 2012-04-13 DIAGNOSIS — F411 Generalized anxiety disorder: Secondary | ICD-10-CM | POA: Insufficient documentation

## 2012-04-13 DIAGNOSIS — F1520 Other stimulant dependence, uncomplicated: Secondary | ICD-10-CM | POA: Insufficient documentation

## 2012-04-13 DIAGNOSIS — Z8739 Personal history of other diseases of the musculoskeletal system and connective tissue: Secondary | ICD-10-CM | POA: Insufficient documentation

## 2012-04-13 DIAGNOSIS — F329 Major depressive disorder, single episode, unspecified: Secondary | ICD-10-CM | POA: Insufficient documentation

## 2012-04-13 DIAGNOSIS — Z8719 Personal history of other diseases of the digestive system: Secondary | ICD-10-CM | POA: Insufficient documentation

## 2012-04-13 DIAGNOSIS — Z79899 Other long term (current) drug therapy: Secondary | ICD-10-CM | POA: Insufficient documentation

## 2012-04-13 DIAGNOSIS — F1210 Cannabis abuse, uncomplicated: Secondary | ICD-10-CM | POA: Insufficient documentation

## 2012-04-13 DIAGNOSIS — K219 Gastro-esophageal reflux disease without esophagitis: Secondary | ICD-10-CM | POA: Insufficient documentation

## 2012-04-13 DIAGNOSIS — R1110 Vomiting, unspecified: Secondary | ICD-10-CM | POA: Insufficient documentation

## 2012-04-13 DIAGNOSIS — F17200 Nicotine dependence, unspecified, uncomplicated: Secondary | ICD-10-CM | POA: Insufficient documentation

## 2012-04-13 DIAGNOSIS — R1013 Epigastric pain: Secondary | ICD-10-CM | POA: Insufficient documentation

## 2012-04-13 DIAGNOSIS — F3289 Other specified depressive episodes: Secondary | ICD-10-CM | POA: Insufficient documentation

## 2012-04-13 DIAGNOSIS — Z9889 Other specified postprocedural states: Secondary | ICD-10-CM | POA: Insufficient documentation

## 2012-04-13 DIAGNOSIS — Z8619 Personal history of other infectious and parasitic diseases: Secondary | ICD-10-CM | POA: Insufficient documentation

## 2012-04-13 MED ORDER — PROMETHAZINE HCL 25 MG PO TABS: 25.0000 mg | ORAL_TABLET | Freq: Four times a day (QID) | ORAL | Status: DC | PRN

## 2012-04-13 MED ORDER — ONDANSETRON 4 MG PO TBDP
4.0000 mg | ORAL_TABLET | Freq: Once | ORAL | Status: AC
  Administered 2012-04-13: 4 mg via ORAL
  Filled 2012-04-13: qty 1

## 2012-04-13 MED ORDER — OXYCODONE-ACETAMINOPHEN 5-325 MG PO TABS
1.0000 | ORAL_TABLET | Freq: Once | ORAL | Status: AC
  Administered 2012-04-13: 1 via ORAL
  Filled 2012-04-13: qty 1

## 2012-04-13 MED ORDER — PROMETHAZINE HCL 25 MG RE SUPP: 25.0000 mg | Freq: Four times a day (QID) | RECTAL | Status: DC | PRN

## 2012-04-13 NOTE — ED Notes (Signed)
Pt stated she became "queasy" after drinking sprite.

## 2012-04-13 NOTE — ED Notes (Signed)
Pt reports ab/chest pain for years. Is followed at [facility] and has appointments next week for follow up.  Has been seen in er for same this week.

## 2012-04-13 NOTE — ED Notes (Signed)
Given meds as ordered, dirnking sprite without difficulty.
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
## 2012-04-13 NOTE — ED Notes (Signed)
Pt has called out repeatedly requesting meds for pain. md notified.

## 2012-04-13 NOTE — ED Notes (Signed)
Pt c/o abd pain and vomiting that she has been recently seen in ER for this week several times. Symptoms the same as were before. Pt ambulated to room without difficulty.

## 2012-04-16 ENCOUNTER — Emergency Department (HOSPITAL_COMMUNITY)
Admission: EM | Admit: 2012-04-16 | Discharge: 2012-04-16 | Disposition: A | Discharge status: Home / Self Care | Payer: Self-pay | Attending: Emergency Medicine | Admitting: Emergency Medicine

## 2012-04-16 ENCOUNTER — Encounter (HOSPITAL_COMMUNITY): Payer: Self-pay

## 2012-04-16 DIAGNOSIS — R1110 Vomiting, unspecified: Secondary | ICD-10-CM | POA: Insufficient documentation

## 2012-04-16 DIAGNOSIS — Z8719 Personal history of other diseases of the digestive system: Secondary | ICD-10-CM | POA: Insufficient documentation

## 2012-04-16 DIAGNOSIS — F1120 Opioid dependence, uncomplicated: Secondary | ICD-10-CM | POA: Insufficient documentation

## 2012-04-16 DIAGNOSIS — Z8619 Personal history of other infectious and parasitic diseases: Secondary | ICD-10-CM | POA: Insufficient documentation

## 2012-04-16 DIAGNOSIS — R109 Unspecified abdominal pain: Secondary | ICD-10-CM

## 2012-04-16 DIAGNOSIS — F1210 Cannabis abuse, uncomplicated: Secondary | ICD-10-CM | POA: Insufficient documentation

## 2012-04-16 DIAGNOSIS — Z79899 Other long term (current) drug therapy: Secondary | ICD-10-CM | POA: Insufficient documentation

## 2012-04-16 DIAGNOSIS — R1013 Epigastric pain: Secondary | ICD-10-CM | POA: Insufficient documentation

## 2012-04-16 DIAGNOSIS — F411 Generalized anxiety disorder: Secondary | ICD-10-CM | POA: Insufficient documentation

## 2012-04-16 DIAGNOSIS — I10 Essential (primary) hypertension: Secondary | ICD-10-CM | POA: Insufficient documentation

## 2012-04-16 DIAGNOSIS — F329 Major depressive disorder, single episode, unspecified: Secondary | ICD-10-CM | POA: Insufficient documentation

## 2012-04-16 DIAGNOSIS — G8929 Other chronic pain: Secondary | ICD-10-CM | POA: Insufficient documentation

## 2012-04-16 DIAGNOSIS — F17200 Nicotine dependence, unspecified, uncomplicated: Secondary | ICD-10-CM | POA: Insufficient documentation

## 2012-04-16 DIAGNOSIS — F3289 Other specified depressive episodes: Secondary | ICD-10-CM | POA: Insufficient documentation

## 2012-04-16 DIAGNOSIS — K219 Gastro-esophageal reflux disease without esophagitis: Secondary | ICD-10-CM | POA: Insufficient documentation

## 2012-04-16 DIAGNOSIS — Z9889 Other specified postprocedural states: Secondary | ICD-10-CM | POA: Insufficient documentation

## 2012-04-16 MED ORDER — ONDANSETRON HCL 4 MG PO TABS: 4.0000 mg | ORAL_TABLET | Freq: Four times a day (QID) | ORAL | Status: DC

## 2012-04-16 MED ORDER — ONDANSETRON 8 MG PO TBDP
8.0000 mg | ORAL_TABLET | Freq: Once | ORAL | Status: AC
  Administered 2012-04-16: 8 mg via ORAL
  Filled 2012-04-16: qty 1

## 2012-04-16 MED ORDER — HYDROMORPHONE HCL PF 1 MG/ML IJ SOLN
1.0000 mg | Freq: Once | INTRAMUSCULAR | Status: AC
  Administered 2012-04-16: 1 mg via INTRAMUSCULAR
  Filled 2012-04-16: qty 1

## 2012-04-16 NOTE — ED Notes (Signed)
Abdominal pain with vomiting, got real intense around 0200 this morning per pt.
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
## 2012-04-19 ENCOUNTER — Emergency Department (HOSPITAL_COMMUNITY)
Admission: EM | Admit: 2012-04-19 | Discharge: 2012-04-19 | Disposition: A | Discharge status: Home / Self Care | Payer: Self-pay | Attending: Emergency Medicine | Admitting: Emergency Medicine

## 2012-04-19 ENCOUNTER — Encounter (HOSPITAL_COMMUNITY): Payer: Self-pay | Admitting: *Deleted

## 2012-04-19 DIAGNOSIS — F3289 Other specified depressive episodes: Secondary | ICD-10-CM | POA: Insufficient documentation

## 2012-04-19 DIAGNOSIS — Z8619 Personal history of other infectious and parasitic diseases: Secondary | ICD-10-CM | POA: Insufficient documentation

## 2012-04-19 DIAGNOSIS — Z8739 Personal history of other diseases of the musculoskeletal system and connective tissue: Secondary | ICD-10-CM | POA: Insufficient documentation

## 2012-04-19 DIAGNOSIS — F411 Generalized anxiety disorder: Secondary | ICD-10-CM | POA: Insufficient documentation

## 2012-04-19 DIAGNOSIS — F1120 Opioid dependence, uncomplicated: Secondary | ICD-10-CM | POA: Insufficient documentation

## 2012-04-19 DIAGNOSIS — K219 Gastro-esophageal reflux disease without esophagitis: Secondary | ICD-10-CM | POA: Insufficient documentation

## 2012-04-19 DIAGNOSIS — F17200 Nicotine dependence, unspecified, uncomplicated: Secondary | ICD-10-CM | POA: Insufficient documentation

## 2012-04-19 DIAGNOSIS — F329 Major depressive disorder, single episode, unspecified: Secondary | ICD-10-CM | POA: Insufficient documentation

## 2012-04-19 DIAGNOSIS — F1210 Cannabis abuse, uncomplicated: Secondary | ICD-10-CM | POA: Insufficient documentation

## 2012-04-19 DIAGNOSIS — R109 Unspecified abdominal pain: Secondary | ICD-10-CM | POA: Insufficient documentation

## 2012-04-19 DIAGNOSIS — I10 Essential (primary) hypertension: Secondary | ICD-10-CM | POA: Insufficient documentation

## 2012-04-19 DIAGNOSIS — Z8719 Personal history of other diseases of the digestive system: Secondary | ICD-10-CM | POA: Insufficient documentation

## 2012-04-19 DIAGNOSIS — Z79899 Other long term (current) drug therapy: Secondary | ICD-10-CM | POA: Insufficient documentation

## 2012-04-19 DIAGNOSIS — R1110 Vomiting, unspecified: Secondary | ICD-10-CM | POA: Insufficient documentation

## 2012-04-19 MED ORDER — LORAZEPAM 2 MG/ML IJ SOLN
1.0000 mg | Freq: Once | INTRAMUSCULAR | Status: AC
  Administered 2012-04-19: 22:00:00 via INTRAMUSCULAR
  Filled 2012-04-19: qty 1

## 2012-04-19 MED ORDER — HYDROMORPHONE HCL PF 1 MG/ML IJ SOLN
2.0000 mg | Freq: Once | INTRAMUSCULAR | Status: AC
  Administered 2012-04-19: 2 mg via INTRAMUSCULAR
  Filled 2012-04-19: qty 2

## 2012-04-19 MED ORDER — HYDROMORPHONE HCL PF 1 MG/ML IJ SOLN
1.0000 mg | Freq: Once | INTRAMUSCULAR | Status: AC
  Administered 2012-04-19: 1 mg via INTRAMUSCULAR
  Filled 2012-04-19: qty 1

## 2012-04-19 MED ORDER — ONDANSETRON 4 MG PO TBDP
4.0000 mg | ORAL_TABLET | Freq: Once | ORAL | Status: AC
  Administered 2012-04-19: 4 mg via ORAL
  Filled 2012-04-19: qty 1

## 2012-04-19 MED ORDER — PROMETHAZINE HCL 25 MG/ML IJ SOLN
25.0000 mg | Freq: Once | INTRAMUSCULAR | Status: AC
  Administered 2012-04-19: 25 mg via INTRAMUSCULAR
  Filled 2012-04-19: qty 1

## 2012-04-19 MED ORDER — GI COCKTAIL ~~LOC~~
30.0000 mL | Freq: Once | ORAL | Status: AC
  Administered 2012-04-19: 30 mL via ORAL
  Filled 2012-04-19: qty 30

## 2012-04-19 NOTE — ED Notes (Signed)
Pain epigastric and lower sternal area, vomiting.

## 2012-04-20 ENCOUNTER — Encounter (HOSPITAL_COMMUNITY): Payer: Self-pay | Admitting: *Deleted

## 2012-04-20 ENCOUNTER — Emergency Department (HOSPITAL_COMMUNITY)
Admission: EM | Admit: 2012-04-20 | Discharge: 2012-04-20 | Disposition: A | Discharge status: Home / Self Care | Payer: Self-pay | Attending: Emergency Medicine | Admitting: Emergency Medicine

## 2012-04-20 DIAGNOSIS — F1120 Opioid dependence, uncomplicated: Secondary | ICD-10-CM | POA: Insufficient documentation

## 2012-04-20 DIAGNOSIS — F411 Generalized anxiety disorder: Secondary | ICD-10-CM | POA: Insufficient documentation

## 2012-04-20 DIAGNOSIS — I10 Essential (primary) hypertension: Secondary | ICD-10-CM | POA: Insufficient documentation

## 2012-04-20 DIAGNOSIS — R112 Nausea with vomiting, unspecified: Secondary | ICD-10-CM | POA: Insufficient documentation

## 2012-04-20 DIAGNOSIS — Z79899 Other long term (current) drug therapy: Secondary | ICD-10-CM | POA: Insufficient documentation

## 2012-04-20 DIAGNOSIS — R6883 Chills (without fever): Secondary | ICD-10-CM | POA: Insufficient documentation

## 2012-04-20 DIAGNOSIS — F329 Major depressive disorder, single episode, unspecified: Secondary | ICD-10-CM | POA: Insufficient documentation

## 2012-04-20 DIAGNOSIS — Z8719 Personal history of other diseases of the digestive system: Secondary | ICD-10-CM | POA: Insufficient documentation

## 2012-04-20 DIAGNOSIS — F3289 Other specified depressive episodes: Secondary | ICD-10-CM | POA: Insufficient documentation

## 2012-04-20 DIAGNOSIS — F17200 Nicotine dependence, unspecified, uncomplicated: Secondary | ICD-10-CM | POA: Insufficient documentation

## 2012-04-20 DIAGNOSIS — B977 Papillomavirus as the cause of diseases classified elsewhere: Secondary | ICD-10-CM | POA: Insufficient documentation

## 2012-04-20 DIAGNOSIS — F1920 Other psychoactive substance dependence, uncomplicated: Secondary | ICD-10-CM | POA: Insufficient documentation

## 2012-04-20 DIAGNOSIS — R109 Unspecified abdominal pain: Secondary | ICD-10-CM

## 2012-04-20 DIAGNOSIS — G8929 Other chronic pain: Secondary | ICD-10-CM | POA: Insufficient documentation

## 2012-04-20 DIAGNOSIS — Z8679 Personal history of other diseases of the circulatory system: Secondary | ICD-10-CM | POA: Insufficient documentation

## 2012-04-20 DIAGNOSIS — R1013 Epigastric pain: Secondary | ICD-10-CM | POA: Insufficient documentation

## 2012-04-20 DIAGNOSIS — R1110 Vomiting, unspecified: Secondary | ICD-10-CM

## 2012-04-20 DIAGNOSIS — K219 Gastro-esophageal reflux disease without esophagitis: Secondary | ICD-10-CM | POA: Insufficient documentation

## 2012-04-20 DIAGNOSIS — F1210 Cannabis abuse, uncomplicated: Secondary | ICD-10-CM | POA: Insufficient documentation

## 2012-04-20 LAB — CBC WITH DIFFERENTIAL/PLATELET
Basophils Absolute: 0 10*3/uL (ref 0.0–0.1)
Eosinophils Relative: 3 % (ref 0–5)
HCT: 36.8 % (ref 36.0–46.0)
Hemoglobin: 12.5 g/dL (ref 12.0–15.0)
Lymphocytes Relative: 40 % (ref 12–46)
Lymphs Abs: 3.2 10*3/uL (ref 0.7–4.0)
MCV: 88.7 fL (ref 78.0–100.0)
Monocytes Absolute: 0.8 10*3/uL (ref 0.1–1.0)
Monocytes Relative: 10 % (ref 3–12)
Neutro Abs: 3.6 10*3/uL (ref 1.7–7.7)
RBC: 4.15 MIL/uL (ref 3.87–5.11)
WBC: 7.9 10*3/uL (ref 4.0–10.5)

## 2012-04-20 LAB — URINALYSIS, ROUTINE W REFLEX MICROSCOPIC
Glucose, UA: NEGATIVE mg/dL
Hgb urine dipstick: NEGATIVE
Leukocytes, UA: NEGATIVE
Protein, ur: NEGATIVE mg/dL
Urobilinogen, UA: 0.2 mg/dL (ref 0.0–1.0)

## 2012-04-20 LAB — COMPREHENSIVE METABOLIC PANEL
AST: 12 U/L (ref 0–37)
CO2: 29 mEq/L (ref 19–32)
Calcium: 9.5 mg/dL (ref 8.4–10.5)
Chloride: 99 mEq/L (ref 96–112)
Creatinine, Ser: 0.7 mg/dL (ref 0.50–1.10)
GFR calc Af Amer: >90 mL/min (ref >90)
GFR calc non Af Amer: >90 mL/min (ref >90)
Glucose, Bld: 100 mg/dL — ABNORMAL HIGH (ref 70–99)
Total Bilirubin: 0.2 mg/dL — ABNORMAL LOW (ref 0.3–1.2)

## 2012-04-20 MED ORDER — ONDANSETRON HCL 4 MG/2ML IJ SOLN
4.0000 mg | Freq: Once | INTRAMUSCULAR | Status: AC
  Administered 2012-04-20: 4 mg via INTRAVENOUS
  Filled 2012-04-20: qty 2

## 2012-04-20 MED ORDER — SODIUM CHLORIDE 0.9 % IV SOLN
1000.0000 mL | Freq: Once | INTRAVENOUS | Status: AC
  Administered 2012-04-20: 1000 mL via INTRAVENOUS

## 2012-04-20 MED ORDER — SODIUM CHLORIDE 0.9 % IV SOLN: 1000.0000 mL | INTRAVENOUS | Status: DC

## 2012-04-20 MED ORDER — LORAZEPAM 2 MG/ML IJ SOLN
1.0000 mg | Freq: Once | INTRAMUSCULAR | Status: AC
  Administered 2012-04-20: 1 mg via INTRAVENOUS
  Filled 2012-04-20: qty 1

## 2012-04-20 MED ORDER — FAMOTIDINE IN NACL 20-0.9 MG/50ML-% IV SOLN
20.0000 mg | Freq: Once | INTRAVENOUS | Status: AC
  Administered 2012-04-20: 20 mg via INTRAVENOUS
  Filled 2012-04-20: qty 50

## 2012-04-20 MED ORDER — MORPHINE SULFATE 4 MG/ML IJ SOLN
4.0000 mg | Freq: Once | INTRAMUSCULAR | Status: AC
  Administered 2012-04-20: 4 mg via INTRAVENOUS
  Filled 2012-04-20: qty 1

## 2012-04-20 MED ORDER — PROMETHAZINE HCL 25 MG RE SUPP: 25.0000 mg | Freq: Four times a day (QID) | RECTAL | Status: DC | PRN

## 2012-04-20 MED ORDER — SODIUM CHLORIDE 0.9 % IV BOLUS (SEPSIS)
1000.0000 mL | Freq: Once | INTRAVENOUS | Status: AC
  Administered 2012-04-20: 1000 mL via INTRAVENOUS

## 2012-04-20 NOTE — ED Notes (Signed)
Now sitting up rubbing epigastric area, "I think my pancreas is acting up again" states she's vomited 4 times today. No vomiting here.
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
## 2012-04-20 NOTE — ED Notes (Signed)
abd pain, vomiting, Seen here yesterday for same

## 2012-04-20 NOTE — ED Notes (Signed)
Pt now removing own IV, ready to go home, declined 3rd Liter IV fluids
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
## 2012-04-20 NOTE — ED Notes (Signed)
Pt requesting something for pain, I explained that Dr. Lynelle Doctor will see her shortly.

## 2012-04-20 NOTE — ED Notes (Signed)
MD at bedside. 

## 2012-04-20 NOTE — ED Notes (Signed)
Appears in no acute distress, laughing and chatting with lab tech and myself. No splinting of abd.

## 2012-04-21 ENCOUNTER — Emergency Department (HOSPITAL_COMMUNITY)
Admission: EM | Admit: 2012-04-21 | Discharge: 2012-04-21 | Disposition: A | Discharge status: Home / Self Care | Payer: Self-pay | Attending: Emergency Medicine | Admitting: Emergency Medicine

## 2012-04-21 ENCOUNTER — Encounter (HOSPITAL_COMMUNITY): Payer: Self-pay | Admitting: *Deleted

## 2012-04-21 DIAGNOSIS — Z79899 Other long term (current) drug therapy: Secondary | ICD-10-CM | POA: Insufficient documentation

## 2012-04-21 DIAGNOSIS — F411 Generalized anxiety disorder: Secondary | ICD-10-CM | POA: Insufficient documentation

## 2012-04-21 DIAGNOSIS — F17200 Nicotine dependence, unspecified, uncomplicated: Secondary | ICD-10-CM | POA: Insufficient documentation

## 2012-04-21 DIAGNOSIS — I10 Essential (primary) hypertension: Secondary | ICD-10-CM | POA: Insufficient documentation

## 2012-04-21 DIAGNOSIS — Z8659 Personal history of other mental and behavioral disorders: Secondary | ICD-10-CM | POA: Insufficient documentation

## 2012-04-21 DIAGNOSIS — R112 Nausea with vomiting, unspecified: Secondary | ICD-10-CM | POA: Insufficient documentation

## 2012-04-21 DIAGNOSIS — F1910 Other psychoactive substance abuse, uncomplicated: Secondary | ICD-10-CM | POA: Insufficient documentation

## 2012-04-21 DIAGNOSIS — G8929 Other chronic pain: Secondary | ICD-10-CM | POA: Insufficient documentation

## 2012-04-21 DIAGNOSIS — Z8739 Personal history of other diseases of the musculoskeletal system and connective tissue: Secondary | ICD-10-CM | POA: Insufficient documentation

## 2012-04-21 DIAGNOSIS — Z765 Malingerer [conscious simulation]: Secondary | ICD-10-CM | POA: Insufficient documentation

## 2012-04-21 DIAGNOSIS — K219 Gastro-esophageal reflux disease without esophagitis: Secondary | ICD-10-CM | POA: Insufficient documentation

## 2012-04-21 DIAGNOSIS — Z8619 Personal history of other infectious and parasitic diseases: Secondary | ICD-10-CM | POA: Insufficient documentation

## 2012-04-21 MED ORDER — DIPHENHYDRAMINE HCL 50 MG/ML IJ SOLN: 25.0000 mg | Freq: Once | INTRAMUSCULAR | Status: DC

## 2012-04-21 MED ORDER — DIPHENHYDRAMINE HCL 50 MG/ML IJ SOLN
25.0000 mg | Freq: Once | INTRAMUSCULAR | Status: DC
  Filled 2012-04-21: qty 1

## 2012-04-21 MED ORDER — PROCHLORPERAZINE EDISYLATE 5 MG/ML IJ SOLN
10.0000 mg | Freq: Once | INTRAMUSCULAR | Status: DC
  Filled 2012-04-21: qty 2

## 2012-04-21 MED ORDER — PROMETHAZINE HCL 25 MG/ML IJ SOLN
25.0000 mg | Freq: Once | INTRAMUSCULAR | Status: AC
  Administered 2012-04-21: 25 mg via INTRAMUSCULAR
  Filled 2012-04-21: qty 1

## 2012-04-21 MED ORDER — DIPHENHYDRAMINE HCL 50 MG/ML IJ SOLN
25.0000 mg | Freq: Once | INTRAMUSCULAR | Status: AC
  Administered 2012-04-21: 25 mg via INTRAMUSCULAR

## 2012-04-21 MED ORDER — KETOROLAC TROMETHAMINE 60 MG/2ML IM SOLN
60.0000 mg | Freq: Once | INTRAMUSCULAR | Status: AC
  Administered 2012-04-21: 60 mg via INTRAMUSCULAR
  Filled 2012-04-21: qty 2

## 2012-04-21 NOTE — ED Notes (Signed)
Pt alert & oriented x4, stable gait. Patient given discharge instructions, paperwork & prescription(s). Patient  instructed to stop at the registration desk to finish any additional paperwork. Patient verbalized understanding. Pt left department w/ no further questions. 
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
## 2012-04-21 NOTE — ED Notes (Signed)
Pt has HX of pancreatitis, co abdominal pain x2 days, worse last few hours. Co n/v denies diarrhea

## 2012-04-23 ENCOUNTER — Encounter (HOSPITAL_COMMUNITY): Payer: Self-pay | Admitting: *Deleted

## 2012-04-23 ENCOUNTER — Emergency Department (HOSPITAL_COMMUNITY)
Admission: EM | Admit: 2012-04-23 | Discharge: 2012-04-24 | Disposition: A | Discharge status: Home / Self Care | Payer: Self-pay | Attending: Emergency Medicine | Admitting: Emergency Medicine

## 2012-04-23 DIAGNOSIS — I10 Essential (primary) hypertension: Secondary | ICD-10-CM | POA: Insufficient documentation

## 2012-04-23 DIAGNOSIS — Z8739 Personal history of other diseases of the musculoskeletal system and connective tissue: Secondary | ICD-10-CM | POA: Insufficient documentation

## 2012-04-23 DIAGNOSIS — F17200 Nicotine dependence, unspecified, uncomplicated: Secondary | ICD-10-CM | POA: Insufficient documentation

## 2012-04-23 DIAGNOSIS — R112 Nausea with vomiting, unspecified: Secondary | ICD-10-CM | POA: Insufficient documentation

## 2012-04-23 DIAGNOSIS — F1110 Opioid abuse, uncomplicated: Secondary | ICD-10-CM | POA: Insufficient documentation

## 2012-04-23 DIAGNOSIS — K219 Gastro-esophageal reflux disease without esophagitis: Secondary | ICD-10-CM | POA: Insufficient documentation

## 2012-04-23 DIAGNOSIS — R109 Unspecified abdominal pain: Secondary | ICD-10-CM

## 2012-04-23 DIAGNOSIS — F411 Generalized anxiety disorder: Secondary | ICD-10-CM | POA: Insufficient documentation

## 2012-04-23 DIAGNOSIS — F329 Major depressive disorder, single episode, unspecified: Secondary | ICD-10-CM | POA: Insufficient documentation

## 2012-04-23 DIAGNOSIS — R1013 Epigastric pain: Secondary | ICD-10-CM | POA: Insufficient documentation

## 2012-04-23 DIAGNOSIS — Q453 Other congenital malformations of pancreas and pancreatic duct: Secondary | ICD-10-CM | POA: Insufficient documentation

## 2012-04-23 DIAGNOSIS — F1211 Cannabis abuse, in remission: Secondary | ICD-10-CM | POA: Insufficient documentation

## 2012-04-23 DIAGNOSIS — K861 Other chronic pancreatitis: Secondary | ICD-10-CM | POA: Insufficient documentation

## 2012-04-23 DIAGNOSIS — F3289 Other specified depressive episodes: Secondary | ICD-10-CM | POA: Insufficient documentation

## 2012-04-23 DIAGNOSIS — Z79899 Other long term (current) drug therapy: Secondary | ICD-10-CM | POA: Insufficient documentation

## 2012-04-23 DIAGNOSIS — Z8619 Personal history of other infectious and parasitic diseases: Secondary | ICD-10-CM | POA: Insufficient documentation

## 2012-04-23 MED ORDER — PANTOPRAZOLE SODIUM 40 MG IV SOLR
40.0000 mg | Freq: Once | INTRAVENOUS | Status: AC
  Administered 2012-04-24: 40 mg via INTRAVENOUS
  Filled 2012-04-23: qty 40

## 2012-04-23 MED ORDER — ONDANSETRON HCL 4 MG/2ML IJ SOLN
4.0000 mg | Freq: Once | INTRAMUSCULAR | Status: AC
  Administered 2012-04-24: 4 mg via INTRAVENOUS
  Filled 2012-04-23: qty 2

## 2012-04-23 MED ORDER — DIPHENHYDRAMINE HCL 50 MG/ML IJ SOLN
25.0000 mg | Freq: Once | INTRAMUSCULAR | Status: AC
  Administered 2012-04-24: 25 mg via INTRAVENOUS
  Filled 2012-04-23: qty 1

## 2012-04-23 MED ORDER — SODIUM CHLORIDE 0.9 % IV SOLN
Freq: Once | INTRAVENOUS | Status: AC
  Administered 2012-04-24: 1000 mL via INTRAVENOUS

## 2012-04-23 MED ORDER — HYDROMORPHONE HCL PF 2 MG/ML IJ SOLN
2.0000 mg | Freq: Once | INTRAMUSCULAR | Status: AC
  Administered 2012-04-24: 2 mg via INTRAVENOUS
  Filled 2012-04-23: qty 1

## 2012-04-23 MED ORDER — SODIUM CHLORIDE 0.9 % IV BOLUS (SEPSIS)
1000.0000 mL | Freq: Once | INTRAVENOUS | Status: AC
  Administered 2012-04-24: 1000 mL via INTRAVENOUS
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
## 2012-04-23 NOTE — ED Notes (Addendum)
Pt unable to see pain specialist until tomorrow for her pancreatitis. Pt c/o abdominal pain and n/v. Unable to keep home pain meds down.

## 2012-04-24 MED ORDER — PROMETHAZINE HCL 25 MG RE SUPP
25.0000 mg | Freq: Once | RECTAL | Status: DC
  Filled 2012-04-24: qty 1

## 2012-04-24 MED ORDER — ONDANSETRON HCL 4 MG/2ML IJ SOLN
4.0000 mg | Freq: Once | INTRAMUSCULAR | Status: AC
  Administered 2012-04-24: 4 mg via INTRAVENOUS
  Filled 2012-04-24: qty 2

## 2012-04-24 NOTE — ED Notes (Signed)
Vomiting semi digested food

## 2012-04-30 ENCOUNTER — Encounter (HOSPITAL_COMMUNITY): Payer: Self-pay | Admitting: *Deleted

## 2012-04-30 ENCOUNTER — Emergency Department (HOSPITAL_COMMUNITY)
Admission: EM | Admit: 2012-04-30 | Discharge: 2012-04-30 | Disposition: A | Discharge status: Home / Self Care | Payer: Self-pay | Attending: Emergency Medicine | Admitting: Emergency Medicine

## 2012-04-30 DIAGNOSIS — F17200 Nicotine dependence, unspecified, uncomplicated: Secondary | ICD-10-CM | POA: Insufficient documentation

## 2012-04-30 DIAGNOSIS — F329 Major depressive disorder, single episode, unspecified: Secondary | ICD-10-CM | POA: Insufficient documentation

## 2012-04-30 DIAGNOSIS — R110 Nausea: Secondary | ICD-10-CM | POA: Insufficient documentation

## 2012-04-30 DIAGNOSIS — R1013 Epigastric pain: Secondary | ICD-10-CM | POA: Insufficient documentation

## 2012-04-30 DIAGNOSIS — F3289 Other specified depressive episodes: Secondary | ICD-10-CM | POA: Insufficient documentation

## 2012-04-30 DIAGNOSIS — K219 Gastro-esophageal reflux disease without esophagitis: Secondary | ICD-10-CM | POA: Insufficient documentation

## 2012-04-30 DIAGNOSIS — R109 Unspecified abdominal pain: Secondary | ICD-10-CM

## 2012-04-30 DIAGNOSIS — Z8739 Personal history of other diseases of the musculoskeletal system and connective tissue: Secondary | ICD-10-CM | POA: Insufficient documentation

## 2012-04-30 DIAGNOSIS — F411 Generalized anxiety disorder: Secondary | ICD-10-CM | POA: Insufficient documentation

## 2012-04-30 DIAGNOSIS — I10 Essential (primary) hypertension: Secondary | ICD-10-CM | POA: Insufficient documentation

## 2012-04-30 DIAGNOSIS — K920 Hematemesis: Secondary | ICD-10-CM | POA: Insufficient documentation

## 2012-04-30 DIAGNOSIS — Z8619 Personal history of other infectious and parasitic diseases: Secondary | ICD-10-CM | POA: Insufficient documentation

## 2012-04-30 DIAGNOSIS — Z8719 Personal history of other diseases of the digestive system: Secondary | ICD-10-CM | POA: Insufficient documentation

## 2012-04-30 DIAGNOSIS — Z79899 Other long term (current) drug therapy: Secondary | ICD-10-CM | POA: Insufficient documentation

## 2012-04-30 DIAGNOSIS — F1210 Cannabis abuse, uncomplicated: Secondary | ICD-10-CM | POA: Insufficient documentation

## 2012-04-30 DIAGNOSIS — F1120 Opioid dependence, uncomplicated: Secondary | ICD-10-CM | POA: Insufficient documentation

## 2012-04-30 MED ORDER — PANTOPRAZOLE SODIUM 40 MG IV SOLR
40.0000 mg | Freq: Once | INTRAVENOUS | Status: AC
  Administered 2012-04-30: 40 mg via INTRAVENOUS
  Filled 2012-04-30: qty 40

## 2012-04-30 MED ORDER — ONDANSETRON HCL 4 MG/2ML IJ SOLN
4.0000 mg | Freq: Once | INTRAMUSCULAR | Status: AC
  Administered 2012-04-30: 4 mg via INTRAVENOUS
  Filled 2012-04-30: qty 2

## 2012-04-30 MED ORDER — SODIUM CHLORIDE 0.9 % IV BOLUS (SEPSIS)
1000.0000 mL | Freq: Once | INTRAVENOUS | Status: AC
  Administered 2012-04-30: 1000 mL via INTRAVENOUS

## 2012-04-30 MED ORDER — PROMETHAZINE HCL 25 MG RE SUPP: 25.0000 mg | Freq: Four times a day (QID) | RECTAL | Status: DC | PRN
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
## 2012-04-30 NOTE — ED Notes (Signed)
Patient

## 2012-04-30 NOTE — ED Notes (Signed)
Pt states that she woke up this am with n/v, abd pain, noticed dark ?blood in her vomit this am,

## 2012-04-30 NOTE — ED Notes (Signed)
Patient called out for pain medicine. MD made aware.

## 2012-04-30 NOTE — ED Notes (Signed)
Pt co pain shooting into upper back with no relief

## 2012-05-02 ENCOUNTER — Emergency Department (HOSPITAL_COMMUNITY): Payer: Self-pay

## 2012-05-02 ENCOUNTER — Encounter (HOSPITAL_COMMUNITY): Payer: Self-pay | Admitting: *Deleted

## 2012-05-02 ENCOUNTER — Emergency Department (HOSPITAL_COMMUNITY)
Admission: EM | Admit: 2012-05-02 | Discharge: 2012-05-02 | Disposition: A | Discharge status: Home / Self Care | Payer: Self-pay | Attending: Emergency Medicine | Admitting: Emergency Medicine

## 2012-05-02 DIAGNOSIS — Z8719 Personal history of other diseases of the digestive system: Secondary | ICD-10-CM | POA: Insufficient documentation

## 2012-05-02 DIAGNOSIS — F1210 Cannabis abuse, uncomplicated: Secondary | ICD-10-CM | POA: Insufficient documentation

## 2012-05-02 DIAGNOSIS — F411 Generalized anxiety disorder: Secondary | ICD-10-CM | POA: Insufficient documentation

## 2012-05-02 DIAGNOSIS — F1120 Opioid dependence, uncomplicated: Secondary | ICD-10-CM | POA: Insufficient documentation

## 2012-05-02 DIAGNOSIS — Z8619 Personal history of other infectious and parasitic diseases: Secondary | ICD-10-CM | POA: Insufficient documentation

## 2012-05-02 DIAGNOSIS — Y929 Unspecified place or not applicable: Secondary | ICD-10-CM | POA: Insufficient documentation

## 2012-05-02 DIAGNOSIS — F17200 Nicotine dependence, unspecified, uncomplicated: Secondary | ICD-10-CM | POA: Insufficient documentation

## 2012-05-02 DIAGNOSIS — I10 Essential (primary) hypertension: Secondary | ICD-10-CM | POA: Insufficient documentation

## 2012-05-02 DIAGNOSIS — Z8659 Personal history of other mental and behavioral disorders: Secondary | ICD-10-CM | POA: Insufficient documentation

## 2012-05-02 DIAGNOSIS — Z79899 Other long term (current) drug therapy: Secondary | ICD-10-CM | POA: Insufficient documentation

## 2012-05-02 DIAGNOSIS — Y939 Activity, unspecified: Secondary | ICD-10-CM | POA: Insufficient documentation

## 2012-05-02 DIAGNOSIS — W208XXA Other cause of strike by thrown, projected or falling object, initial encounter: Secondary | ICD-10-CM | POA: Insufficient documentation

## 2012-05-02 DIAGNOSIS — K219 Gastro-esophageal reflux disease without esophagitis: Secondary | ICD-10-CM | POA: Insufficient documentation

## 2012-05-02 DIAGNOSIS — S6000XA Contusion of unspecified finger without damage to nail, initial encounter: Secondary | ICD-10-CM | POA: Insufficient documentation

## 2012-05-02 MED ORDER — IBUPROFEN 400 MG PO TABS
400.0000 mg | ORAL_TABLET | Freq: Once | ORAL | Status: AC
  Administered 2012-05-02: 400 mg via ORAL
  Filled 2012-05-02: qty 1

## 2012-05-02 MED ORDER — HYDROCODONE-ACETAMINOPHEN 5-325 MG PO TABS: 1.0000 | ORAL_TABLET | Freq: Four times a day (QID) | ORAL | Status: AC | PRN

## 2012-05-02 MED ORDER — HYDROCODONE-ACETAMINOPHEN 5-325 MG PO TABS
1.0000 | ORAL_TABLET | Freq: Once | ORAL | Status: AC
  Administered 2012-05-02: 1 via ORAL
  Filled 2012-05-02: qty 1

## 2012-05-02 NOTE — ED Notes (Signed)
Farm equipment fell on rt  Hand, Pain /swelling ring finger
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
## 2012-05-02 NOTE — ED Provider Notes (Signed)
Medical screening examination/treatment/procedure(s) were performed by non-physician practitioner and as supervising physician I was immediately available for consultation/collaboration.   Charles B. Bernette Mayers, MD 05/02/12 1436

## 2012-05-18 NOTE — Progress Notes (Signed)
UR Chart Review Completed  

## 2012-06-03 ENCOUNTER — Other Ambulatory Visit: Payer: Self-pay

## 2012-07-12 ENCOUNTER — Emergency Department (HOSPITAL_COMMUNITY)
Admission: EM | Admit: 2012-07-12 | Discharge: 2012-07-13 | Disposition: A | Discharge status: Home / Self Care | Payer: Self-pay | Attending: Emergency Medicine | Admitting: Emergency Medicine

## 2012-07-12 ENCOUNTER — Encounter (HOSPITAL_COMMUNITY): Payer: Self-pay | Admitting: Emergency Medicine

## 2012-07-12 DIAGNOSIS — F17200 Nicotine dependence, unspecified, uncomplicated: Secondary | ICD-10-CM | POA: Insufficient documentation

## 2012-07-12 DIAGNOSIS — F411 Generalized anxiety disorder: Secondary | ICD-10-CM | POA: Insufficient documentation

## 2012-07-12 DIAGNOSIS — R52 Pain, unspecified: Secondary | ICD-10-CM | POA: Insufficient documentation

## 2012-07-12 DIAGNOSIS — Z8781 Personal history of (healed) traumatic fracture: Secondary | ICD-10-CM | POA: Insufficient documentation

## 2012-07-12 DIAGNOSIS — Z79899 Other long term (current) drug therapy: Secondary | ICD-10-CM | POA: Insufficient documentation

## 2012-07-12 DIAGNOSIS — F329 Major depressive disorder, single episode, unspecified: Secondary | ICD-10-CM | POA: Insufficient documentation

## 2012-07-12 DIAGNOSIS — F1910 Other psychoactive substance abuse, uncomplicated: Secondary | ICD-10-CM | POA: Insufficient documentation

## 2012-07-12 DIAGNOSIS — Z9889 Other specified postprocedural states: Secondary | ICD-10-CM | POA: Insufficient documentation

## 2012-07-12 DIAGNOSIS — M25562 Pain in left knee: Secondary | ICD-10-CM

## 2012-07-12 DIAGNOSIS — M25569 Pain in unspecified knee: Secondary | ICD-10-CM | POA: Insufficient documentation

## 2012-07-12 DIAGNOSIS — Z87828 Personal history of other (healed) physical injury and trauma: Secondary | ICD-10-CM | POA: Insufficient documentation

## 2012-07-12 DIAGNOSIS — Z8619 Personal history of other infectious and parasitic diseases: Secondary | ICD-10-CM | POA: Insufficient documentation

## 2012-07-12 DIAGNOSIS — K219 Gastro-esophageal reflux disease without esophagitis: Secondary | ICD-10-CM | POA: Insufficient documentation

## 2012-07-12 DIAGNOSIS — Z8739 Personal history of other diseases of the musculoskeletal system and connective tissue: Secondary | ICD-10-CM | POA: Insufficient documentation

## 2012-07-12 DIAGNOSIS — Z8719 Personal history of other diseases of the digestive system: Secondary | ICD-10-CM | POA: Insufficient documentation

## 2012-07-12 DIAGNOSIS — F3289 Other specified depressive episodes: Secondary | ICD-10-CM | POA: Insufficient documentation

## 2012-07-12 DIAGNOSIS — I10 Essential (primary) hypertension: Secondary | ICD-10-CM | POA: Insufficient documentation

## 2012-07-12 NOTE — ED Notes (Signed)
Patient reports left knee pain and swelling that started this morning. Reports history of staph infection in right knee before.

## 2012-07-13 ENCOUNTER — Emergency Department (HOSPITAL_COMMUNITY): Payer: Self-pay

## 2012-07-13 MED ORDER — NAPROXEN 500 MG PO TABS: 500.0000 mg | ORAL_TABLET | Freq: Two times a day (BID) | ORAL | Status: DC

## 2012-07-13 MED ORDER — OXYCODONE-ACETAMINOPHEN 5-325 MG PO TABS
2.0000 | ORAL_TABLET | Freq: Once | ORAL | Status: AC
  Administered 2012-07-13: 2 via ORAL
  Filled 2012-07-13: qty 2

## 2012-07-13 MED ORDER — ONDANSETRON 8 MG PO TBDP
8.0000 mg | ORAL_TABLET | Freq: Once | ORAL | Status: AC
  Administered 2012-07-13: 8 mg via ORAL
  Filled 2012-07-13 (×2): qty 1
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
## 2012-07-13 NOTE — ED Provider Notes (Signed)
Medical screening examination/treatment/procedure(s) were performed by non-physician practitioner and as supervising physician I was immediately available for consultation/collaboration.  Erby Sanderson, MD 07/13/12 0513 

## 2012-07-31 ENCOUNTER — Encounter (HOSPITAL_COMMUNITY): Payer: Self-pay | Admitting: *Deleted

## 2012-07-31 ENCOUNTER — Inpatient Hospital Stay (HOSPITAL_COMMUNITY)
Admission: EM | Admit: 2012-07-31 | Discharge: 2012-08-03 | DRG: 440 | Disposition: A | Discharge status: Home / Self Care | Payer: MEDICAID | Attending: Internal Medicine | Admitting: Internal Medicine

## 2012-07-31 DIAGNOSIS — Q453 Other congenital malformations of pancreas and pancreatic duct: Secondary | ICD-10-CM

## 2012-07-31 DIAGNOSIS — K219 Gastro-esophageal reflux disease without esophagitis: Secondary | ICD-10-CM | POA: Diagnosis present

## 2012-07-31 DIAGNOSIS — D649 Anemia, unspecified: Secondary | ICD-10-CM

## 2012-07-31 DIAGNOSIS — Z8249 Family history of ischemic heart disease and other diseases of the circulatory system: Secondary | ICD-10-CM

## 2012-07-31 DIAGNOSIS — G479 Sleep disorder, unspecified: Secondary | ICD-10-CM | POA: Diagnosis present

## 2012-07-31 DIAGNOSIS — Z720 Tobacco use: Secondary | ICD-10-CM | POA: Diagnosis present

## 2012-07-31 DIAGNOSIS — F17200 Nicotine dependence, unspecified, uncomplicated: Secondary | ICD-10-CM | POA: Diagnosis present

## 2012-07-31 DIAGNOSIS — F1121 Opioid dependence, in remission: Secondary | ICD-10-CM | POA: Diagnosis present

## 2012-07-31 DIAGNOSIS — F329 Major depressive disorder, single episode, unspecified: Secondary | ICD-10-CM | POA: Diagnosis present

## 2012-07-31 DIAGNOSIS — IMO0002 Reserved for concepts with insufficient information to code with codable children: Secondary | ICD-10-CM | POA: Diagnosis present

## 2012-07-31 DIAGNOSIS — F411 Generalized anxiety disorder: Secondary | ICD-10-CM | POA: Diagnosis present

## 2012-07-31 DIAGNOSIS — G8929 Other chronic pain: Secondary | ICD-10-CM | POA: Diagnosis present

## 2012-07-31 DIAGNOSIS — E876 Hypokalemia: Secondary | ICD-10-CM

## 2012-07-31 DIAGNOSIS — Z91018 Allergy to other foods: Secondary | ICD-10-CM

## 2012-07-31 DIAGNOSIS — F1120 Opioid dependence, uncomplicated: Secondary | ICD-10-CM

## 2012-07-31 DIAGNOSIS — F1210 Cannabis abuse, uncomplicated: Secondary | ICD-10-CM | POA: Diagnosis present

## 2012-07-31 DIAGNOSIS — R109 Unspecified abdominal pain: Secondary | ICD-10-CM

## 2012-07-31 DIAGNOSIS — F3289 Other specified depressive episodes: Secondary | ICD-10-CM | POA: Diagnosis present

## 2012-07-31 DIAGNOSIS — Z91038 Other insect allergy status: Secondary | ICD-10-CM

## 2012-07-31 DIAGNOSIS — K859 Acute pancreatitis without necrosis or infection, unspecified: Principal | ICD-10-CM | POA: Diagnosis present

## 2012-07-31 DIAGNOSIS — B977 Papillomavirus as the cause of diseases classified elsewhere: Secondary | ICD-10-CM | POA: Diagnosis present

## 2012-07-31 DIAGNOSIS — Z833 Family history of diabetes mellitus: Secondary | ICD-10-CM

## 2012-07-31 DIAGNOSIS — F1123 Opioid dependence with withdrawal: Secondary | ICD-10-CM

## 2012-07-31 DIAGNOSIS — I10 Essential (primary) hypertension: Secondary | ICD-10-CM | POA: Diagnosis present

## 2012-07-31 LAB — CBC WITH DIFFERENTIAL/PLATELET
Basophils Absolute: 0 10*3/uL (ref 0.0–0.1)
Basophils Relative: 0 % (ref 0–1)
Hemoglobin: 12.8 g/dL (ref 12.0–15.0)
Lymphocytes Relative: 35 % (ref 12–46)
MCHC: 33.8 g/dL (ref 30.0–36.0)
Neutro Abs: 3.6 10*3/uL (ref 1.7–7.7)
Neutrophils Relative %: 54 % (ref 43–77)
RDW: 14.8 % (ref 11.5–15.5)
WBC: 6.7 10*3/uL (ref 4.0–10.5)

## 2012-07-31 LAB — COMPREHENSIVE METABOLIC PANEL
ALT: 11 U/L (ref 0–35)
AST: 14 U/L (ref 0–37)
Albumin: 4.2 g/dL (ref 3.5–5.2)
Alkaline Phosphatase: 84 U/L (ref 39–117)
CO2: 26 mEq/L (ref 19–32)
Chloride: 102 mEq/L (ref 96–112)
Potassium: 3.6 mEq/L (ref 3.5–5.1)
Total Bilirubin: 0.1 mg/dL — ABNORMAL LOW (ref 0.3–1.2)

## 2012-07-31 MED ORDER — SODIUM CHLORIDE 0.9 % IV SOLN
INTRAVENOUS | Status: DC
  Administered 2012-07-31 – 2012-08-03 (×8): via INTRAVENOUS

## 2012-07-31 MED ORDER — NALOXONE HCL 0.4 MG/ML IJ SOLN: 0.4000 mg | INTRAMUSCULAR | Status: DC | PRN

## 2012-07-31 MED ORDER — PANTOPRAZOLE SODIUM 40 MG IV SOLR
40.0000 mg | Freq: Once | INTRAVENOUS | Status: AC
  Administered 2012-07-31: 40 mg via INTRAVENOUS
  Filled 2012-07-31: qty 40

## 2012-07-31 MED ORDER — DIPHENHYDRAMINE HCL 12.5 MG/5ML PO ELIX
12.5000 mg | ORAL_SOLUTION | Freq: Four times a day (QID) | ORAL | Status: DC | PRN
  Administered 2012-08-01 – 2012-08-02 (×5): 12.5 mg via ORAL
  Filled 2012-07-31 (×5): qty 5

## 2012-07-31 MED ORDER — DIPHENHYDRAMINE HCL 50 MG/ML IJ SOLN: 12.5000 mg | Freq: Four times a day (QID) | INTRAMUSCULAR | Status: DC | PRN

## 2012-07-31 MED ORDER — LORAZEPAM 2 MG/ML IJ SOLN
0.2500 mg | INTRAMUSCULAR | Status: DC | PRN
  Administered 2012-08-01: 0.25 mg via INTRAVENOUS
  Filled 2012-07-31: qty 1

## 2012-07-31 MED ORDER — METOCLOPRAMIDE HCL 5 MG/ML IJ SOLN: 10.0000 mg | Freq: Four times a day (QID) | INTRAMUSCULAR | Status: DC | PRN

## 2012-07-31 MED ORDER — HYDROMORPHONE HCL PF 1 MG/ML IJ SOLN
1.0000 mg | Freq: Once | INTRAMUSCULAR | Status: AC
  Administered 2012-07-31: 1 mg via INTRAVENOUS
  Filled 2012-07-31: qty 1

## 2012-07-31 MED ORDER — SODIUM CHLORIDE 0.9 % IJ SOLN: 9.0000 mL | INTRAMUSCULAR | Status: DC | PRN

## 2012-07-31 MED ORDER — ONDANSETRON HCL 4 MG/2ML IJ SOLN
4.0000 mg | Freq: Four times a day (QID) | INTRAMUSCULAR | Status: DC | PRN
  Administered 2012-07-31 – 2012-08-01 (×4): 4 mg via INTRAVENOUS
  Filled 2012-07-31 (×2): qty 2

## 2012-07-31 MED ORDER — HYDROMORPHONE 0.3 MG/ML IV SOLN
INTRAVENOUS | Status: DC
  Administered 2012-08-01: 4.5 mg via INTRAVENOUS
  Administered 2012-08-01: 1.5 mg via INTRAVENOUS
  Administered 2012-08-01: 20:00:00 via INTRAVENOUS
  Administered 2012-08-01: 0.3 mg via INTRAVENOUS
  Administered 2012-08-01 – 2012-08-02 (×3): via INTRAVENOUS
  Administered 2012-08-02: 8.7 mg via INTRAVENOUS
  Administered 2012-08-02: 6.9 mg via INTRAVENOUS
  Filled 2012-07-31 (×6): qty 25

## 2012-07-31 MED ORDER — ONDANSETRON HCL 4 MG/2ML IJ SOLN
4.0000 mg | Freq: Four times a day (QID) | INTRAMUSCULAR | Status: DC | PRN
  Filled 2012-07-31 (×4): qty 2

## 2012-07-31 MED ORDER — ONDANSETRON HCL 4 MG/2ML IJ SOLN
4.0000 mg | Freq: Once | INTRAMUSCULAR | Status: AC
  Administered 2012-07-31: 4 mg via INTRAVENOUS
  Filled 2012-07-31: qty 2

## 2012-07-31 MED ORDER — ONDANSETRON HCL 4 MG PO TABS: 4.0000 mg | ORAL_TABLET | Freq: Four times a day (QID) | ORAL | Status: DC | PRN

## 2012-07-31 MED ORDER — SODIUM CHLORIDE 0.9 % IV BOLUS (SEPSIS)
1000.0000 mL | Freq: Once | INTRAVENOUS | Status: AC
  Administered 2012-07-31: 1000 mL via INTRAVENOUS

## 2012-07-31 NOTE — ED Notes (Signed)
abd pain for 3 days, vomiting onset today, Hx of pancreatitis
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
## 2012-08-01 DIAGNOSIS — G479 Sleep disorder, unspecified: Secondary | ICD-10-CM | POA: Diagnosis present

## 2012-08-01 DIAGNOSIS — F411 Generalized anxiety disorder: Secondary | ICD-10-CM | POA: Diagnosis present

## 2012-08-01 DIAGNOSIS — F17200 Nicotine dependence, unspecified, uncomplicated: Secondary | ICD-10-CM

## 2012-08-01 LAB — GLUCOSE, CAPILLARY: Glucose-Capillary: 90 mg/dL (ref 70–99)

## 2012-08-01 MED ORDER — ONDANSETRON HCL 4 MG/2ML IJ SOLN
4.0000 mg | INTRAMUSCULAR | Status: DC | PRN
  Administered 2012-08-01 – 2012-08-03 (×8): 4 mg via INTRAVENOUS
  Filled 2012-08-01 (×6): qty 2

## 2012-08-01 MED ORDER — ZOLPIDEM TARTRATE 5 MG PO TABS
5.0000 mg | ORAL_TABLET | Freq: Every day | ORAL | Status: DC
  Administered 2012-08-01 – 2012-08-02 (×2): 5 mg via ORAL
  Filled 2012-08-01 (×2): qty 1

## 2012-08-01 NOTE — Progress Notes (Signed)
UR Chart Review Completed  
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
## 2012-08-01 NOTE — Progress Notes (Signed)
[facility] PROGRESS NOTE  [name] ZOX:[id number] DOB: [birth year] DOA: 07/31/2012 PCP: No primary provider on file.  Assessment/Plan: Principal Problem:   Pancreatitis, recurrent: Recheck lipase level in the morning. Continue n.p.o. plus medicine for pain and nausea Active Problems:   Tobacco abuse: Patient declined nicotine patch.   Sleep disturbance: Patient requested nightly Ambien. Have ordered.   Generalized anxiety disorder when necessary Ativan. Patient feels that she can get some sleep, will help.   Code Status: Full code  Family Communication: Plan discussed with patient and her husband at the bedside.  Disposition Plan: Home in a few days once tolerating by mouth without pain   Consultants:  None  Procedures:  None  Antibiotics:  None  HPI/Subjective: Patient feeling a little bit better than admission. Greatly improved in pain and nausea. Feeling somewhat anxious. Fatigued.  Objective: Filed Vitals:   08/01/12 0020 08/01/12 0422 08/01/12 0729 08/01/12 1234  BP:      Pulse:      Temp:      TempSrc:      Resp: 22 12 13 16   Height:      Weight:      SpO2: 100% 100% 98% 98%    Intake/Output Summary (Last 24 hours) at 08/01/12 1251 Last data filed at 08/01/12 1045  Gross per 24 hour  Intake    180 ml  Output      0 ml  Net    180 ml   Filed Weights   07/31/12 1801 07/31/12 2300  Weight: 46.72 kg (103 lb) 48.8 kg (107 lb 9.4 oz)    Exam:   General:  Alert and oriented x3, mildly anxious  Cardiovascular: Regular rate and rhythm, S1-S2  Respiratory: Clear to auscultation bilaterally  Abdomen: Soft, mild tenderness in the midepigastric region, hyperactive bowel sounds  Musculoskeletal: No clubbing or cyanosis or edema   Data Reviewed: Basic Metabolic Panel:  Recent Labs Lab 07/31/12 1821  NA 137  K 3.6  CL 102  CO2 26  GLUCOSE 99  BUN 11  CREATININE 0.69  CALCIUM 9.5   Liver Function Tests:  Recent Labs Lab  07/31/12 1821  AST 14  ALT 11  ALKPHOS 84  BILITOT 0.1*  PROT 7.6  ALBUMIN 4.2    Recent Labs Lab 07/31/12 1821  LIPASE 882*   CBC:  Recent Labs Lab 07/31/12 1821  WBC 6.7  NEUTROABS 3.6  HGB 12.8  HCT 37.9  MCV 89.0  PLT 330   CBG:  Recent Labs Lab 08/01/12 0745  GLUCAP 90      Studies: No results found.  Scheduled Meds: . HYDROmorphone PCA 0.3 mg/mL   Intravenous Q4H  . zolpidem  5 mg Oral QHS   Continuous Infusions: . sodium chloride 150 mL/hr at 08/01/12 8119    Principal Problem:   Pancreatitis, recurrent Active Problems:   Tobacco abuse   Sleep disturbance   Generalized anxiety disorder    Time spent: 20 minutes    Hollice Espy  [facility] Pager [phone] If 7PM-7AM, please contact night-coverage at [email or web address], password [facility] 08/01/2012, 12:51 PM  LOS: 1 day

## 2012-08-02 DIAGNOSIS — R109 Unspecified abdominal pain: Secondary | ICD-10-CM

## 2012-08-02 LAB — GLUCOSE, CAPILLARY: Glucose-Capillary: 61 mg/dL — ABNORMAL LOW (ref 70–99)

## 2012-08-02 MED ORDER — PRO-STAT SUGAR FREE PO LIQD: 30.0000 mL | Freq: Every day | ORAL | Status: DC

## 2012-08-02 MED ORDER — DEXTROSE 50 % IV SOLN
INTRAVENOUS | Status: AC
  Filled 2012-08-02: qty 50

## 2012-08-02 MED ORDER — BOOST / RESOURCE BREEZE PO LIQD
1.0000 | Freq: Two times a day (BID) | ORAL | Status: DC
Start: 2012-08-02 — End: 2012-08-02

## 2012-08-02 MED ORDER — HYDROMORPHONE HCL PF 1 MG/ML IJ SOLN
0.5000 mg | INTRAMUSCULAR | Status: DC | PRN
  Administered 2012-08-02 – 2012-08-03 (×5): 1 mg via INTRAVENOUS
  Filled 2012-08-02 (×5): qty 1

## 2012-08-02 MED ORDER — DEXTROSE 50 % IV SOLN
25.0000 mL | Freq: Once | INTRAVENOUS | Status: AC | PRN
  Administered 2012-08-02: 25 mL via INTRAVENOUS

## 2012-08-02 MED ORDER — MORPHINE SULFATE 4 MG/ML IJ SOLN
4.0000 mg | INTRAMUSCULAR | Status: DC | PRN
  Administered 2012-08-02 (×3): 4 mg via INTRAVENOUS
  Filled 2012-08-02 (×3): qty 1

## 2012-08-02 NOTE — Progress Notes (Signed)
Hypoglycemic Event  CBG: 61  Treatment: D50 IV 25 mL  Symptoms: None  Follow-up CBG: Time:0815 CBG Result:136  Possible Reasons for Event: Inadequate meal intake  Comments/MD notified:Given 25 ml of D50 since NPO at this time.  136 on recheck    Laney Pastor  Remember to initiate Hypoglycemia Order Set & complete

## 2012-08-02 NOTE — Progress Notes (Signed)
     Subjective: This lady was once again admitted with acute pancreatitis. She does feel better than she did yesterday. She is somewhat nauseous. She is on a PCA pump.           Physical Exam: Blood pressure 123/84, pulse 86, temperature 97.3 F (36.3 C), temperature source Oral, resp. rate 12, height 5\' 5"  (1.651 m), weight 48.8 kg (107 lb 9.4 oz), last menstrual period 03/31/2012, SpO2 98.00%. She looks systemically well. She is not toxic or septic her abdomen is soft nontender. Bowel sounds are not heard. She is alert and orientated.   Investigations:  No results found for this or any previous visit (from the past 240 hour(s)).   Basic Metabolic Panel:  Recent Labs  16/10/96 1821  NA 137  K 3.6  CL 102  CO2 26  GLUCOSE 99  BUN 11  CREATININE 0.69  CALCIUM 9.5   Liver Function Tests:  Recent Labs  07/31/12 1821  AST 14  ALT 11  ALKPHOS 84  BILITOT 0.1*  PROT 7.6  ALBUMIN 4.2     CBC:  Recent Labs  07/31/12 1821  WBC 6.7  NEUTROABS 3.6  HGB 12.8  HCT 37.9  MCV 89.0  PLT 330    No results found.    Medications: I have reviewed the patient's current medications.  Impression: 1. Acute recurrent pancreatitis. 2. History of opioid dependence in the past. 3. Generalized anxiety disorder. 4. Tobacco abuse     Plan: 1. Discontinue PCA pump. 2. Morphine IV when necessary for pain. 3. Advance diet.     LOS: 2 days   Wilson Singer Pager [phone]  08/02/2012, 10:46 AM

## 2012-08-03 LAB — COMPREHENSIVE METABOLIC PANEL
AST: 12 U/L (ref 0–37)
Albumin: 3.4 g/dL — ABNORMAL LOW (ref 3.5–5.2)
Calcium: 8.9 mg/dL (ref 8.4–10.5)
Creatinine, Ser: 0.57 mg/dL (ref 0.50–1.10)

## 2012-08-03 LAB — GLUCOSE, CAPILLARY: Glucose-Capillary: 106 mg/dL — ABNORMAL HIGH (ref 70–99)

## 2012-08-03 LAB — CBC
MCH: 30.6 pg (ref 26.0–34.0)
MCV: 89.1 fL (ref 78.0–100.0)
Platelets: 280 10*3/uL (ref 150–400)
RDW: 14.6 % (ref 11.5–15.5)

## 2012-08-03 MED ORDER — OXYCODONE HCL 5 MG PO TABS
5.0000 mg | ORAL_TABLET | ORAL | Status: DC | PRN
  Administered 2012-08-03 (×2): 5 mg via ORAL
  Filled 2012-08-03 (×2): qty 1

## 2012-08-03 MED ORDER — OXYCODONE HCL 5 MG PO TABS: 5.0000 mg | ORAL_TABLET | ORAL | Status: DC | PRN

## 2012-08-03 NOTE — Progress Notes (Signed)
     Subjective: This lady was made n.p.o. yesterday after she apparently had more abdominal pain and vomiting. She says she feels somewhat better this morning.           Physical Exam: Blood pressure 108/75, pulse 88, temperature 97.6 F (36.4 C), temperature source Oral, resp. rate 16, height 5\' 5"  (1.651 m), weight 48.8 kg (107 lb 9.4 oz), last menstrual period 03/31/2012, SpO2 91.00%. She looks systemically well. She is not toxic or septic her abdomen is soft nontender. Bowel sounds are not heard. She is alert and orientated.   Investigations:     Basic Metabolic Panel:  Recent Labs  40/98/11 1821 08/03/12 0537  NA 137 141  K 3.6 3.7  CL 102 107  CO2 26 28  GLUCOSE 99 81  BUN 11 3*  CREATININE 0.69 0.57  CALCIUM 9.5 8.9   Liver Function Tests:  Recent Labs  07/31/12 1821 08/03/12 0537  AST 14 12  ALT 11 8  ALKPHOS 84 73  BILITOT 0.1* 0.1*  PROT 7.6 6.1  ALBUMIN 4.2 3.4*     CBC:  Recent Labs  07/31/12 1821 08/03/12 0537  WBC 6.7 6.3  NEUTROABS 3.6  --   HGB 12.8 11.8*  HCT 37.9 34.4*  MCV 89.0 89.1  PLT 330 280    No results found.    Medications: I have reviewed the patient's current medications.  Impression: 1. Acute recurrent pancreatitis. Her lipase continues to improve significantly. 2. History of opioid dependence in the past. 3. Generalized anxiety disorder. 4. Tobacco abuse     Plan: 1. Discontinue IV opioids. 2. Oral opioids when necessary. 3. Advance diet. 4. Home soon .    LOS: 3 days   Wilson Singer Pager [phone]  08/03/2012, 8:45 AM

## 2012-08-03 NOTE — Care Management Note (Unsigned)
    Page 1 of 1   08/03/2012     2:11:31 PM   CARE MANAGEMENT NOTE 08/03/2012  Patient:  [name], [name]   Account Number:  [id number]  Date Initiated:  08/01/2012  Documentation initiated by:  Rosemary Holms  Subjective/Objective Assessment:   Pt admitted from home where she lives with her significant other. CM spoke w/ pt at bedside. No HH needs anticipated     Action/Plan:   Anticipated DC Date:  08/03/2012   Anticipated DC Plan:  HOME/SELF CARE      DC Planning Services  CM consult      Choice offered to / List presented to:             Status of service:  In process, will continue to follow Medicare Important Message given?   (If response is "NO", the following Medicare IM given date fields will be blank) Date Medicare IM given:   Date Additional Medicare IM given:    Discharge Disposition:    Per UR Regulation:    If discussed at Long Length of Stay Meetings, dates discussed:    Comments:  08/01/12 Toree Edling Leanord Hawking RN BSN CM
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
## 2012-08-03 NOTE — Progress Notes (Signed)
Pt discharged home with husband via private vehicle. VSS. Instructed on new pain medication. IV access removed. No signs of distress. Pt had no questions.

## 2012-08-24 ENCOUNTER — Encounter (HOSPITAL_COMMUNITY): Payer: Self-pay | Admitting: Emergency Medicine

## 2012-08-24 ENCOUNTER — Emergency Department (HOSPITAL_COMMUNITY)
Admission: EM | Admit: 2012-08-24 | Discharge: 2012-08-24 | Disposition: A | Discharge status: Home / Self Care | Payer: Self-pay | Attending: Emergency Medicine | Admitting: Emergency Medicine

## 2012-08-24 DIAGNOSIS — K219 Gastro-esophageal reflux disease without esophagitis: Secondary | ICD-10-CM | POA: Insufficient documentation

## 2012-08-24 DIAGNOSIS — R1013 Epigastric pain: Secondary | ICD-10-CM | POA: Insufficient documentation

## 2012-08-24 DIAGNOSIS — F329 Major depressive disorder, single episode, unspecified: Secondary | ICD-10-CM | POA: Insufficient documentation

## 2012-08-24 DIAGNOSIS — F3289 Other specified depressive episodes: Secondary | ICD-10-CM | POA: Insufficient documentation

## 2012-08-24 DIAGNOSIS — R109 Unspecified abdominal pain: Secondary | ICD-10-CM

## 2012-08-24 DIAGNOSIS — Z8719 Personal history of other diseases of the digestive system: Secondary | ICD-10-CM | POA: Insufficient documentation

## 2012-08-24 DIAGNOSIS — F17200 Nicotine dependence, unspecified, uncomplicated: Secondary | ICD-10-CM | POA: Insufficient documentation

## 2012-08-24 DIAGNOSIS — F1210 Cannabis abuse, uncomplicated: Secondary | ICD-10-CM | POA: Insufficient documentation

## 2012-08-24 DIAGNOSIS — F411 Generalized anxiety disorder: Secondary | ICD-10-CM | POA: Insufficient documentation

## 2012-08-24 DIAGNOSIS — I10 Essential (primary) hypertension: Secondary | ICD-10-CM | POA: Insufficient documentation

## 2012-08-24 DIAGNOSIS — Z79899 Other long term (current) drug therapy: Secondary | ICD-10-CM | POA: Insufficient documentation

## 2012-08-24 LAB — COMPREHENSIVE METABOLIC PANEL
ALT: 9 U/L (ref 0–35)
AST: 12 U/L (ref 0–37)
Calcium: 9.6 mg/dL (ref 8.4–10.5)
Creatinine, Ser: 0.6 mg/dL (ref 0.50–1.10)
GFR calc Af Amer: >90 mL/min (ref >90)
Glucose, Bld: 101 mg/dL — ABNORMAL HIGH (ref 70–99)
Sodium: 139 mEq/L (ref 135–145)
Total Protein: 7.3 g/dL (ref 6.0–8.3)

## 2012-08-24 LAB — CBC
HCT: 39.6 % (ref 36.0–46.0)
Hemoglobin: 13.6 g/dL (ref 12.0–15.0)
MCHC: 34.3 g/dL (ref 30.0–36.0)
RBC: 4.49 MIL/uL (ref 3.87–5.11)

## 2012-08-24 LAB — LIPASE, BLOOD: Lipase: 76 U/L — ABNORMAL HIGH (ref 11–59)

## 2012-08-24 MED ORDER — HYDROMORPHONE HCL PF 1 MG/ML IJ SOLN
1.0000 mg | Freq: Once | INTRAMUSCULAR | Status: AC
  Administered 2012-08-24: 1 mg via INTRAVENOUS
  Filled 2012-08-24: qty 1

## 2012-08-24 MED ORDER — PROMETHAZINE HCL 25 MG PO TABS: 25.0000 mg | ORAL_TABLET | Freq: Four times a day (QID) | ORAL | Status: DC | PRN

## 2012-08-24 MED ORDER — ONDANSETRON HCL 4 MG/2ML IJ SOLN
4.0000 mg | Freq: Once | INTRAMUSCULAR | Status: AC
  Administered 2012-08-24: 4 mg via INTRAVENOUS
  Filled 2012-08-24: qty 2

## 2012-08-24 MED ORDER — ONDANSETRON HCL 4 MG/2ML IJ SOLN: 4.0000 mg | Freq: Once | INTRAMUSCULAR | Status: AC

## 2012-08-24 MED ORDER — SODIUM CHLORIDE 0.9 % IV BOLUS (SEPSIS)
1000.0000 mL | Freq: Once | INTRAVENOUS | Status: AC
  Administered 2012-08-24: 1000 mL via INTRAVENOUS

## 2012-08-24 MED ORDER — ONDANSETRON HCL 4 MG/2ML IJ SOLN
INTRAMUSCULAR | Status: AC
  Administered 2012-08-24: 4 mg via INTRAVENOUS
  Filled 2012-08-24: qty 2

## 2012-08-24 MED ORDER — OXYCODONE-ACETAMINOPHEN 5-325 MG PO TABS: 2.0000 | ORAL_TABLET | ORAL | Status: DC | PRN

## 2012-08-24 MED ORDER — OXYCODONE HCL 5 MG PO TABS: 5.0000 mg | ORAL_TABLET | ORAL | Status: DC | PRN

## 2012-08-24 MED ORDER — ONDANSETRON HCL 4 MG/2ML IJ SOLN
INTRAMUSCULAR | Status: AC
  Filled 2012-08-24: qty 2

## 2012-08-24 NOTE — ED Notes (Signed)
Patient with no complaints at this time. Respirations even and unlabored. Skin warm/dry. Discharge instructions reviewed with patient at this time. Patient given opportunity to voice concerns/ask questions. IV removed per policy and band-aid applied to site. Patient discharged at this time and left Emergency Department with steady gait.  

## 2012-08-24 NOTE — ED Notes (Signed)
Pt waiting on ride one at present time
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
## 2012-08-24 NOTE — ED Notes (Signed)
Pt c/o abd pain with nausea since last night.
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
## 2012-08-24 NOTE — ED Provider Notes (Signed)
No acute abdomen at discharge. Patient has followup at [facility]. Discharge meds Percocet #20. Phenergan 25 mg #20  Donnetta Hutching, MD 08/24/12 [phone]

## 2012-08-24 NOTE — ED Notes (Signed)
PT called again requesting something for pain and nausea that the doctor told her he put in.

## 2012-09-15 ENCOUNTER — Encounter (HOSPITAL_COMMUNITY): Payer: Self-pay | Admitting: *Deleted

## 2012-09-15 ENCOUNTER — Emergency Department (HOSPITAL_COMMUNITY)
Admission: EM | Admit: 2012-09-15 | Discharge: 2012-09-16 | Disposition: A | Discharge status: Home / Self Care | Payer: Self-pay | Attending: Emergency Medicine | Admitting: Emergency Medicine

## 2012-09-15 DIAGNOSIS — F17200 Nicotine dependence, unspecified, uncomplicated: Secondary | ICD-10-CM | POA: Insufficient documentation

## 2012-09-15 DIAGNOSIS — I10 Essential (primary) hypertension: Secondary | ICD-10-CM | POA: Insufficient documentation

## 2012-09-15 DIAGNOSIS — Z3202 Encounter for pregnancy test, result negative: Secondary | ICD-10-CM | POA: Insufficient documentation

## 2012-09-15 DIAGNOSIS — R112 Nausea with vomiting, unspecified: Secondary | ICD-10-CM | POA: Insufficient documentation

## 2012-09-15 DIAGNOSIS — Z8619 Personal history of other infectious and parasitic diseases: Secondary | ICD-10-CM | POA: Insufficient documentation

## 2012-09-15 DIAGNOSIS — Z8739 Personal history of other diseases of the musculoskeletal system and connective tissue: Secondary | ICD-10-CM | POA: Insufficient documentation

## 2012-09-15 DIAGNOSIS — K861 Other chronic pancreatitis: Secondary | ICD-10-CM | POA: Insufficient documentation

## 2012-09-15 DIAGNOSIS — F411 Generalized anxiety disorder: Secondary | ICD-10-CM | POA: Insufficient documentation

## 2012-09-15 DIAGNOSIS — R109 Unspecified abdominal pain: Secondary | ICD-10-CM | POA: Insufficient documentation

## 2012-09-15 DIAGNOSIS — Z79899 Other long term (current) drug therapy: Secondary | ICD-10-CM | POA: Insufficient documentation

## 2012-09-15 DIAGNOSIS — K219 Gastro-esophageal reflux disease without esophagitis: Secondary | ICD-10-CM | POA: Insufficient documentation

## 2012-09-15 DIAGNOSIS — Z8659 Personal history of other mental and behavioral disorders: Secondary | ICD-10-CM | POA: Insufficient documentation

## 2012-09-15 LAB — COMPREHENSIVE METABOLIC PANEL
Albumin: 4.7 g/dL (ref 3.5–5.2)
Alkaline Phosphatase: 85 U/L (ref 39–117)
BUN: 8 mg/dL (ref 6–23)
CO2: 26 mEq/L (ref 19–32)
Chloride: 100 mEq/L (ref 96–112)
Creatinine, Ser: 0.58 mg/dL (ref 0.50–1.10)
GFR calc Af Amer: >90 mL/min (ref >90)
GFR calc non Af Amer: >90 mL/min (ref >90)
Glucose, Bld: 109 mg/dL — ABNORMAL HIGH (ref 70–99)
Potassium: 3.4 mEq/L — ABNORMAL LOW (ref 3.5–5.1)
Total Bilirubin: 0.3 mg/dL (ref 0.3–1.2)

## 2012-09-15 LAB — CBC
HCT: 38.6 % (ref 36.0–46.0)
Hemoglobin: 13 g/dL (ref 12.0–15.0)
MCV: 88.7 fL (ref 78.0–100.0)
RDW: 14.9 % (ref 11.5–15.5)
WBC: 9.8 10*3/uL (ref 4.0–10.5)

## 2012-09-15 MED ORDER — HYDROMORPHONE HCL PF 1 MG/ML IJ SOLN
1.0000 mg | Freq: Once | INTRAMUSCULAR | Status: AC
  Administered 2012-09-15: 1 mg via INTRAVENOUS
  Filled 2012-09-15: qty 1

## 2012-09-15 MED ORDER — ONDANSETRON HCL 4 MG/2ML IJ SOLN
4.0000 mg | Freq: Once | INTRAMUSCULAR | Status: AC
  Administered 2012-09-15: 4 mg via INTRAVENOUS
  Filled 2012-09-15: qty 2

## 2012-09-15 MED ORDER — KETOROLAC TROMETHAMINE 30 MG/ML IJ SOLN
30.0000 mg | Freq: Once | INTRAMUSCULAR | Status: AC
  Administered 2012-09-15: 30 mg via INTRAVENOUS
  Filled 2012-09-15: qty 1
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
## 2012-09-15 NOTE — ED Notes (Signed)
abd pain N/V hx of pancreatitis

## 2012-09-16 MED ORDER — POTASSIUM CHLORIDE CRYS ER 20 MEQ PO TBCR
20.0000 meq | EXTENDED_RELEASE_TABLET | Freq: Once | ORAL | Status: DC
  Filled 2012-09-16: qty 1

## 2012-09-16 MED ORDER — PROMETHAZINE HCL 25 MG/ML IJ SOLN
25.0000 mg | Freq: Once | INTRAMUSCULAR | Status: AC
  Administered 2012-09-16: 25 mg via INTRAVENOUS
  Filled 2012-09-16: qty 1

## 2012-09-18 ENCOUNTER — Inpatient Hospital Stay (HOSPITAL_COMMUNITY)
Admission: EM | Admit: 2012-09-18 | Discharge: 2012-09-22 | DRG: 440 | Disposition: A | Discharge status: Home / Self Care | Payer: MEDICAID | Attending: Internal Medicine | Admitting: Internal Medicine

## 2012-09-18 ENCOUNTER — Encounter (HOSPITAL_COMMUNITY): Payer: Self-pay | Admitting: *Deleted

## 2012-09-18 DIAGNOSIS — Q453 Other congenital malformations of pancreas and pancreatic duct: Secondary | ICD-10-CM

## 2012-09-18 DIAGNOSIS — Z8249 Family history of ischemic heart disease and other diseases of the circulatory system: Secondary | ICD-10-CM

## 2012-09-18 DIAGNOSIS — I10 Essential (primary) hypertension: Secondary | ICD-10-CM | POA: Diagnosis present

## 2012-09-18 DIAGNOSIS — G479 Sleep disorder, unspecified: Secondary | ICD-10-CM

## 2012-09-18 DIAGNOSIS — F329 Major depressive disorder, single episode, unspecified: Secondary | ICD-10-CM | POA: Diagnosis present

## 2012-09-18 DIAGNOSIS — F17200 Nicotine dependence, unspecified, uncomplicated: Secondary | ICD-10-CM | POA: Diagnosis present

## 2012-09-18 DIAGNOSIS — F1123 Opioid dependence with withdrawal: Secondary | ICD-10-CM

## 2012-09-18 DIAGNOSIS — Z833 Family history of diabetes mellitus: Secondary | ICD-10-CM

## 2012-09-18 DIAGNOSIS — D649 Anemia, unspecified: Secondary | ICD-10-CM

## 2012-09-18 DIAGNOSIS — F1210 Cannabis abuse, uncomplicated: Secondary | ICD-10-CM | POA: Diagnosis present

## 2012-09-18 DIAGNOSIS — IMO0002 Reserved for concepts with insufficient information to code with codable children: Secondary | ICD-10-CM | POA: Diagnosis present

## 2012-09-18 DIAGNOSIS — F411 Generalized anxiety disorder: Secondary | ICD-10-CM | POA: Diagnosis present

## 2012-09-18 DIAGNOSIS — Z720 Tobacco use: Secondary | ICD-10-CM | POA: Diagnosis present

## 2012-09-18 DIAGNOSIS — K859 Acute pancreatitis without necrosis or infection, unspecified: Principal | ICD-10-CM | POA: Diagnosis present

## 2012-09-18 DIAGNOSIS — Z79899 Other long term (current) drug therapy: Secondary | ICD-10-CM

## 2012-09-18 DIAGNOSIS — R109 Unspecified abdominal pain: Secondary | ICD-10-CM

## 2012-09-18 DIAGNOSIS — F1120 Opioid dependence, uncomplicated: Secondary | ICD-10-CM

## 2012-09-18 DIAGNOSIS — K219 Gastro-esophageal reflux disease without esophagitis: Secondary | ICD-10-CM | POA: Diagnosis present

## 2012-09-18 DIAGNOSIS — K861 Other chronic pancreatitis: Secondary | ICD-10-CM | POA: Diagnosis present

## 2012-09-18 DIAGNOSIS — E876 Hypokalemia: Secondary | ICD-10-CM

## 2012-09-18 DIAGNOSIS — F3289 Other specified depressive episodes: Secondary | ICD-10-CM | POA: Diagnosis present

## 2012-09-18 LAB — CBC WITH DIFFERENTIAL/PLATELET
Basophils Absolute: 0 10*3/uL (ref 0.0–0.1)
Basophils Relative: 0 % (ref 0–1)
Eosinophils Absolute: 0.1 10*3/uL (ref 0.0–0.7)
Eosinophils Relative: 2 % (ref 0–5)
HCT: 38.8 % (ref 36.0–46.0)
Lymphocytes Relative: 34 % (ref 12–46)
MCH: 30.8 pg (ref 26.0–34.0)
MCHC: 35.1 g/dL (ref 30.0–36.0)
MCV: 87.8 fL (ref 78.0–100.0)
Monocytes Absolute: 0.7 10*3/uL (ref 0.1–1.0)
Platelets: 325 10*3/uL (ref 150–400)
RDW: 14.9 % (ref 11.5–15.5)
WBC: 7.8 10*3/uL (ref 4.0–10.5)

## 2012-09-18 LAB — COMPREHENSIVE METABOLIC PANEL
ALT: 11 U/L (ref 0–35)
AST: 15 U/L (ref 0–37)
CO2: 28 mEq/L (ref 19–32)
Calcium: 9.6 mg/dL (ref 8.4–10.5)
Creatinine, Ser: 0.55 mg/dL (ref 0.50–1.10)
GFR calc non Af Amer: >90 mL/min (ref >90)
Sodium: 141 mEq/L (ref 135–145)
Total Protein: 7.4 g/dL (ref 6.0–8.3)

## 2012-09-18 MED ORDER — SODIUM CHLORIDE 0.9 % IV SOLN: INTRAVENOUS | Status: DC

## 2012-09-18 MED ORDER — SODIUM CHLORIDE 0.9 % IV BOLUS (SEPSIS)
1000.0000 mL | Freq: Once | INTRAVENOUS | Status: AC
  Administered 2012-09-18: 1000 mL via INTRAVENOUS

## 2012-09-18 MED ORDER — HYDROMORPHONE HCL PF 1 MG/ML IJ SOLN
1.0000 mg | Freq: Once | INTRAMUSCULAR | Status: AC
  Administered 2012-09-18: 1 mg via INTRAVENOUS
  Filled 2012-09-18: qty 1

## 2012-09-18 MED ORDER — HYDROMORPHONE HCL PF 1 MG/ML IJ SOLN
1.0000 mg | Freq: Once | INTRAMUSCULAR | Status: AC
  Administered 2012-09-18: 1 mg via INTRAVENOUS
  Filled 2012-09-18 (×2): qty 1

## 2012-09-18 MED ORDER — ONDANSETRON HCL 4 MG/2ML IJ SOLN
4.0000 mg | Freq: Once | INTRAMUSCULAR | Status: AC
  Administered 2012-09-18: 4 mg via INTRAVENOUS
  Filled 2012-09-18: qty 2

## 2012-09-18 MED ORDER — ONDANSETRON HCL 4 MG/2ML IJ SOLN
4.0000 mg | Freq: Once | INTRAMUSCULAR | Status: AC
  Administered 2012-09-18: 4 mg via INTRAVENOUS
  Filled 2012-09-18 (×2): qty 2

## 2012-09-18 NOTE — ED Notes (Signed)
Pt states previous meds helped until EDP started pushing on her back. States she is having pain into her back now
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
## 2012-09-18 NOTE — ED Notes (Signed)
Upper abd pain since Friday, vomiting. Hx of pancreatitis

## 2012-09-19 ENCOUNTER — Encounter (HOSPITAL_COMMUNITY): Payer: Self-pay | Admitting: Emergency Medicine

## 2012-09-19 LAB — LIPASE, BLOOD: Lipase: 102 U/L — ABNORMAL HIGH (ref 11–59)

## 2012-09-19 LAB — URINALYSIS W MICROSCOPIC + REFLEX CULTURE
Glucose, UA: NEGATIVE mg/dL
Hgb urine dipstick: NEGATIVE
Leukocytes, UA: NEGATIVE
Protein, ur: NEGATIVE mg/dL
Specific Gravity, Urine: 1.01 (ref 1.005–1.030)
Urobilinogen, UA: 0.2 mg/dL (ref 0.0–1.0)

## 2012-09-19 LAB — COMPREHENSIVE METABOLIC PANEL
Albumin: 3.5 g/dL (ref 3.5–5.2)
Alkaline Phosphatase: 69 U/L (ref 39–117)
BUN: 5 mg/dL — ABNORMAL LOW (ref 6–23)
Calcium: 8.6 mg/dL (ref 8.4–10.5)
GFR calc Af Amer: >90 mL/min (ref >90)
Glucose, Bld: 106 mg/dL — ABNORMAL HIGH (ref 70–99)
Potassium: 4.3 mEq/L (ref 3.5–5.1)
Total Protein: 6 g/dL (ref 6.0–8.3)

## 2012-09-19 LAB — CBC
HCT: 34.9 % — ABNORMAL LOW (ref 36.0–46.0)
Hemoglobin: 11.7 g/dL — ABNORMAL LOW (ref 12.0–15.0)
MCH: 30.1 pg (ref 26.0–34.0)
MCHC: 33.5 g/dL (ref 30.0–36.0)
RDW: 15 % (ref 11.5–15.5)

## 2012-09-19 LAB — TSH: TSH: 0.555 u[IU]/mL (ref 0.350–4.500)

## 2012-09-19 LAB — RAPID URINE DRUG SCREEN, HOSP PERFORMED
Amphetamines: NOT DETECTED
Cocaine: NOT DETECTED
Opiates: NOT DETECTED

## 2012-09-19 LAB — MAGNESIUM: Magnesium: 1.9 mg/dL (ref 1.5–2.5)

## 2012-09-19 MED ORDER — ENOXAPARIN SODIUM 40 MG/0.4ML ~~LOC~~ SOLN
40.0000 mg | SUBCUTANEOUS | Status: DC
  Filled 2012-09-19 (×4): qty 0.4

## 2012-09-19 MED ORDER — ONDANSETRON HCL 4 MG/2ML IJ SOLN
4.0000 mg | INTRAMUSCULAR | Status: DC | PRN
  Administered 2012-09-19 – 2012-09-22 (×16): 4 mg via INTRAVENOUS
  Filled 2012-09-19 (×16): qty 2

## 2012-09-19 MED ORDER — DIPHENHYDRAMINE HCL 50 MG/ML IJ SOLN
12.5000 mg | Freq: Four times a day (QID) | INTRAMUSCULAR | Status: DC | PRN
  Administered 2012-09-19 – 2012-09-21 (×5): 12.5 mg via INTRAVENOUS
  Filled 2012-09-19 (×5): qty 1

## 2012-09-19 MED ORDER — SODIUM CHLORIDE 0.9 % IV SOLN
INTRAVENOUS | Status: DC
  Administered 2012-09-19: 01:00:00 via INTRAVENOUS
  Filled 2012-09-19 (×2): qty 1000

## 2012-09-19 MED ORDER — HYDROMORPHONE HCL PF 1 MG/ML IJ SOLN
0.5000 mg | INTRAMUSCULAR | Status: DC | PRN
  Administered 2012-09-19 – 2012-09-20 (×3): 0.5 mg via INTRAVENOUS
  Filled 2012-09-19 (×4): qty 1

## 2012-09-19 MED ORDER — HYDROMORPHONE HCL PF 1 MG/ML IJ SOLN
0.5000 mg | INTRAMUSCULAR | Status: DC | PRN
  Administered 2012-09-19 (×6): 1 mg via INTRAVENOUS
  Filled 2012-09-19 (×6): qty 1

## 2012-09-19 MED ORDER — GABAPENTIN 300 MG PO CAPS
300.0000 mg | ORAL_CAPSULE | Freq: Three times a day (TID) | ORAL | Status: DC
  Administered 2012-09-19 – 2012-09-22 (×8): 300 mg via ORAL
  Filled 2012-09-19 (×8): qty 1

## 2012-09-19 MED ORDER — BISACODYL 10 MG RE SUPP: 10.0000 mg | Freq: Every day | RECTAL | Status: DC | PRN

## 2012-09-19 MED ORDER — POTASSIUM CHLORIDE IN NACL 20-0.9 MEQ/L-% IV SOLN
INTRAVENOUS | Status: DC
  Administered 2012-09-19: 10:00:00 via INTRAVENOUS
  Administered 2012-09-19: 1 mL via INTRAVENOUS
  Administered 2012-09-20 – 2012-09-21 (×6): via INTRAVENOUS

## 2012-09-19 MED ORDER — HYDROMORPHONE HCL PF 1 MG/ML IJ SOLN
0.5000 mg | INTRAMUSCULAR | Status: DC | PRN
  Administered 2012-09-19 (×2): 0.5 mg via INTRAVENOUS
  Filled 2012-09-19 (×2): qty 1

## 2012-09-19 MED ORDER — FLEET ENEMA 7-19 GM/118ML RE ENEM: 1.0000 | ENEMA | Freq: Once | RECTAL | Status: AC | PRN

## 2012-09-19 MED ORDER — PANTOPRAZOLE SODIUM 40 MG IV SOLR
40.0000 mg | Freq: Two times a day (BID) | INTRAVENOUS | Status: DC
  Administered 2012-09-19 – 2012-09-20 (×5): 40 mg via INTRAVENOUS
  Filled 2012-09-19 (×5): qty 40

## 2012-09-19 NOTE — Progress Notes (Signed)
  [name] ZOX:[id number] DOB: [birth year] DOA: 09/18/2012 PCP: No primary provider on file.   Subjective: This lady came in again with acute pancreatitis. She appears to feel better today. She is still requiring intravenous hydromorphone every 2 hours apparently. There is no vomiting thankfully.           Physical Exam: Blood pressure 133/83, pulse 62, temperature 97.9 F (36.6 C), temperature source Oral, resp. rate 18, height 5\' 5"  (1.651 m), weight 48 kg (105 lb 13.1 oz), last menstrual period 03/31/2012, SpO2 93.00%. She looks systemically well. She does not look like she is in pain. Abdomen is soft and slightly tender. She is alert and oriented.        Basic Metabolic Panel:  Recent Labs  81/19/14 1713 09/19/12 0540  NA 141 141  K 4.1 4.3  CL 103 109  CO2 28 26  GLUCOSE 130* 106*  BUN 11 5*  CREATININE 0.55 0.57  CALCIUM 9.6 8.6  MG  --  1.9   Liver Function Tests:  Recent Labs  09/18/12 1713 09/19/12 0540  AST 15 11  ALT 11 9  ALKPHOS 86 69  BILITOT 0.2* 0.2*  PROT 7.4 6.0  ALBUMIN 4.3 3.5     CBC:  Recent Labs  09/18/12 1713 09/19/12 0540  WBC 7.8 7.1  NEUTROABS 4.3  --   HGB 13.6 11.7*  HCT 38.8 34.9*  MCV 87.8 89.7  PLT 325 244        Medications: I have reviewed the patient's current medications.  Impression: 1. Acute recurrent pancreatitis. 2. Generalized anxiety disorder. 3. Ongoing tobacco abuse.     Plan: 1. Advanced diet. 2. Reduce IV hydromorphone.  Consultants:  None.   Procedures:  None.   Antibiotics:  None.                   Code Status: Full code.  Family Communication: Discussed plan with patient at the bedside.   Disposition Plan: Home when medically stable.  Time spent: 20 minutes.   LOS: 1 day   Wilson Singer Pager [phone]  09/19/2012, 11:22 AM

## 2012-09-19 NOTE — Care Management Note (Signed)
    Page 1 of 1   09/22/2012     9:30:41 AM   CARE MANAGEMENT NOTE 09/22/2012  Patient:  [name], [name]   Account Number:  [id number]  Date Initiated:  09/19/2012  Documentation initiated by:  Sharrie Rothman  Subjective/Objective Assessment:   Pt admitted from home with pancreatitis. Pt lives with her husband and will return at discharge. Pt [facility] up at [facility] as well. Will need help with medications at discharge.     Action/Plan:   Will continue to follow and arrange voucher at discharge for medication assistance. Financial counselor aware of self pay status.   Anticipated DC Date:  09/22/2012   Anticipated DC Plan:  HOME/SELF CARE  In-house referral  Financial Counselor      DC Planning Services  CM consult  MATCH Program      Choice offered to / List presented to:             Status of service:  Completed, signed off Medicare Important Message given?   (If response is "NO", the following Medicare IM given date fields will be blank) Date Medicare IM given:   Date Additional Medicare IM given:    Discharge Disposition:  HOME/SELF CARE  Per UR Regulation:    If discussed at Long Length of Stay Meetings, dates discussed:    Comments:  09/22/12 0929 [name], RN BSN CM Pt discharged home today. MATCH voucher given to pt for medication assistance. No other CM needs noted.  09/19/12 1340 [name], RN BSN CM

## 2012-09-19 NOTE — Progress Notes (Signed)
UR chart review completed.  

## 2012-09-19 NOTE — Progress Notes (Signed)
Nutrition Brief Note  Patient identified on the Malnutrition Screening Tool (MST) Report  Body mass index is 17.61 kg/(m^2). Patient meets criteria for underweight based on current BMI.   Wt Readings from Last 10 Encounters:  09/19/12 105 lb 13.1 oz (48 kg)  09/15/12 102 lb (46.267 kg)  08/24/12 105 lb (47.628 kg)  07/31/12 107 lb 9.4 oz (48.8 kg)  07/12/12 112 lb (50.803 kg)  05/02/12 112 lb (50.803 kg)  04/23/12 112 lb (50.803 kg)  04/21/12 112 lb (50.803 kg)  04/20/12 112 lb (50.803 kg)  04/19/12 112 lb (50.803 kg)   Pt wt within 1-2# of full nutrition assessment completed on 4/14.Wt loss of 7#, 5% x 5 months also not significant.  She has hx of recurrent pancreaitis. Current diet order is NPO. Labs and medications reviewed.   No nutrition interventions warranted at this time. If nutrition issues arise, please consult RD.   Royann Shivers MS,RD,LDN,CSG Office: [phone] Pager: [phone]

## 2012-09-20 DIAGNOSIS — R109 Unspecified abdominal pain: Secondary | ICD-10-CM

## 2012-09-20 LAB — COMPREHENSIVE METABOLIC PANEL
ALT: 13 U/L (ref 0–35)
Alkaline Phosphatase: 69 U/L (ref 39–117)
BUN: 4 mg/dL — ABNORMAL LOW (ref 6–23)
CO2: 30 mEq/L (ref 19–32)
Chloride: 106 mEq/L (ref 96–112)
GFR calc Af Amer: >90 mL/min (ref >90)
Glucose, Bld: 92 mg/dL (ref 70–99)
Potassium: 4 mEq/L (ref 3.5–5.1)
Sodium: 140 mEq/L (ref 135–145)
Total Bilirubin: 0.2 mg/dL — ABNORMAL LOW (ref 0.3–1.2)
Total Protein: 5.9 g/dL — ABNORMAL LOW (ref 6.0–8.3)

## 2012-09-20 LAB — CBC
HCT: 34.8 % — ABNORMAL LOW (ref 36.0–46.0)
Hemoglobin: 11.7 g/dL — ABNORMAL LOW (ref 12.0–15.0)
MCHC: 33.6 g/dL (ref 30.0–36.0)
RBC: 3.81 MIL/uL — ABNORMAL LOW (ref 3.87–5.11)
WBC: 6 10*3/uL (ref 4.0–10.5)

## 2012-09-20 LAB — LIPASE, BLOOD: Lipase: 472 U/L — ABNORMAL HIGH (ref 11–59)

## 2012-09-20 MED ORDER — HYDROMORPHONE HCL PF 1 MG/ML IJ SOLN
1.0000 mg | INTRAMUSCULAR | Status: DC | PRN
  Administered 2012-09-20 – 2012-09-21 (×7): 1 mg via INTRAVENOUS
  Filled 2012-09-20 (×8): qty 1

## 2012-09-20 NOTE — Progress Notes (Signed)
Pt tolerated her clear liquid diet and full liquid diet trays today. Advanced her diet to reg for breakfast on 09/21/12 per transcribed order. Will continue to monitor.

## 2012-09-20 NOTE — Progress Notes (Addendum)
  [name] ZOX:[id number] DOB: 26-Jan-1986 DOA: 09/18/2012 PCP: No primary provider on file.   Subjective: This lady came in again with acute pancreatitis. Unfortunately, her pain is not controlled. Her intravenous hydromorphone was reduced yesterday by myself as she appeared to not be particularly in pain. This morning she clearly is in pain. She has no vomiting but feels nauseous with a clear liquid diet. Her lipase is elevated again.           Physical Exam: Blood pressure 104/67, pulse 68, temperature 97.9 F (36.6 C), temperature source Oral, resp. rate 20, height 5\' 5"  (1.651 m), weight 47.356 kg (104 lb 6.4 oz), last menstrual period 03/31/2012, SpO2 100.00%. She looks systemically well. She does  look like she is in pain this morning. Abdomen is soft and slightly tender. She is alert and oriented.        Basic Metabolic Panel:  Recent Labs  81/19/14 0540 09/20/12 0518  NA 141 140  K 4.3 4.0  CL 109 106  CO2 26 30  GLUCOSE 106* 92  BUN 5* 4*  CREATININE 0.57 0.65  CALCIUM 8.6 9.0  MG 1.9  --    Liver Function Tests:  Recent Labs  09/19/12 0540 09/20/12 0518  AST 11 17  ALT 9 13  ALKPHOS 69 69  BILITOT 0.2* 0.2*  PROT 6.0 5.9*  ALBUMIN 3.5 3.5     CBC:  Recent Labs  09/18/12 1713 09/19/12 0540 09/20/12 0518  WBC 7.8 7.1 6.0  NEUTROABS 4.3  --   --   HGB 13.6 11.7* 11.7*  HCT 38.8 34.9* 34.8*  MCV 87.8 89.7 91.3  PLT 325 244 248   Urine drug screen was negative.     Medications: I have reviewed the patient's current medications.  Impression: 1. Acute recurrent pancreatitis. 2. Generalized anxiety disorder. 3. Ongoing tobacco abuse.     Plan: 1. Increase IV fluids. 2. Increase intravenous hydromorphone 1 mg every 3 hours when necessary.  Consultants:  None.   Procedures:  None.   Antibiotics:  None.                   Code Status: Full code.  Family Communication: Discussed plan with patient at the bedside.    Disposition Plan: Home when medically stable.  Time spent: 20 minutes.   LOS: 2 days   Wilson Singer Pager [phone]  09/20/2012, 8:51 AM

## 2012-09-21 LAB — BASIC METABOLIC PANEL
BUN: 3 mg/dL — ABNORMAL LOW (ref 6–23)
Creatinine, Ser: 0.65 mg/dL (ref 0.50–1.10)
GFR calc non Af Amer: >90 mL/min (ref >90)
Glucose, Bld: 134 mg/dL — ABNORMAL HIGH (ref 70–99)
Potassium: 4.6 mEq/L (ref 3.5–5.1)

## 2012-09-21 MED ORDER — PANTOPRAZOLE SODIUM 40 MG PO TBEC
40.0000 mg | DELAYED_RELEASE_TABLET | Freq: Two times a day (BID) | ORAL | Status: DC
  Administered 2012-09-21 – 2012-09-22 (×2): 40 mg via ORAL
  Filled 2012-09-21 (×2): qty 1

## 2012-09-21 MED ORDER — HYDROMORPHONE HCL PF 1 MG/ML IJ SOLN
1.0000 mg | INTRAMUSCULAR | Status: DC | PRN
  Administered 2012-09-21 – 2012-09-22 (×6): 1 mg via INTRAVENOUS
  Filled 2012-09-21 (×5): qty 1

## 2012-09-21 NOTE — Progress Notes (Signed)
The patient is receiving Protonix by the intravenous route.  Based on criteria approved by the Pharmacy and Therapeutics Committee and the Medical Executive Committee, the medication is being converted to the equivalent oral dose form.  These criteria include: -No Active GI bleeding -Able to tolerate diet of full liquids (or better) or tube feeding OR able to tolerate other medications by the oral or enteral route  If you have any questions about this conversion, please contact the Pharmacy Department (ext [phone]).  Thank you.  Elson Clan, [facility] 09/21/2012 8:43 AM

## 2012-09-21 NOTE — Progress Notes (Signed)
  [name] ZOX:[id number] DOB: 01-10-1986 DOA: 09/18/2012 PCP: No primary provider on file.   Subjective: This lady came in again with acute pancreatitis. Her pain is better and she was able to tolerate a diet which was progressed yesterday. She, however, still requires intravenous hydromorphone 1 mg every 3 hours.           Physical Exam: Blood pressure 99/68, pulse 53, temperature 97.5 F (36.4 C), temperature source Oral, resp. rate 20, height 5\' 5"  (1.651 m), weight 48.263 kg (106 lb 6.4 oz), last menstrual period 03/31/2012, SpO2 100.00%. She looks systemically well. She does  look like she is in pain this morning. Abdomen is soft and slightly tender. She is alert and oriented.        Basic Metabolic Panel:  Recent Labs  81/19/14 0540 09/20/12 0518 09/21/12 0523  NA 141 140 142  K 4.3 4.0 4.6  CL 109 106 108  CO2 26 30 28   GLUCOSE 106* 92 134*  BUN 5* 4* 3*  CREATININE 0.57 0.65 0.65  CALCIUM 8.6 9.0 9.3  MG 1.9  --   --    Liver Function Tests:  Recent Labs  09/19/12 0540 09/20/12 0518  AST 11 17  ALT 9 13  ALKPHOS 69 69  BILITOT 0.2* 0.2*  PROT 6.0 5.9*  ALBUMIN 3.5 3.5     CBC:  Recent Labs  09/18/12 1713 09/19/12 0540 09/20/12 0518  WBC 7.8 7.1 6.0  NEUTROABS 4.3  --   --   HGB 13.6 11.7* 11.7*  HCT 38.8 34.9* 34.8*  MCV 87.8 89.7 91.3  PLT 325 244 248   Urine drug screen was negative.     Medications: I have reviewed the patient's current medications.  Impression: 1. Acute recurrent pancreatitis, slowly improving. 2. Generalized anxiety disorder. 3. Ongoing tobacco abuse.     Plan: 1. Reduce IV fluids. Encourage oral intake. 2. Reduce intravenous hydromorphone to 1 mg every 4 hours when necessary.  Consultants:  None.   Procedures:  None.   Antibiotics:  None.                   Code Status: Full code.  Family Communication: Discussed plan with patient at the bedside.   Disposition Plan: Home when  medically stable.  Time spent: 20 minutes.   LOS: 3 days   Wilson Singer Pager [phone]  09/21/2012, 9:03 AM

## 2012-09-22 LAB — BASIC METABOLIC PANEL
Chloride: 105 mEq/L (ref 96–112)
GFR calc Af Amer: >90 mL/min (ref >90)
Potassium: 4.5 mEq/L (ref 3.5–5.1)

## 2012-09-22 LAB — LIPASE, BLOOD: Lipase: 243 U/L — ABNORMAL HIGH (ref 11–59)

## 2012-09-22 MED ORDER — ONDANSETRON 4 MG PO TBDP: 4.0000 mg | ORAL_TABLET | Freq: Three times a day (TID) | ORAL | Status: DC | PRN

## 2012-09-22 MED ORDER — OXYCODONE HCL 5 MG PO TABS: 5.0000 mg | ORAL_TABLET | ORAL | Status: DC | PRN
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
## 2012-09-22 NOTE — Progress Notes (Signed)
AVS reviewed with patient.  Prescriptions and voucher from Case Manager provided to patient.  Pt verbalized understanding of d/c instructions.  Pt's IV removed.  Site WNL.  Pt transported by NT to main entrance for discharge.  Pt stable at time of discharge.

## 2012-09-25 ENCOUNTER — Emergency Department (HOSPITAL_COMMUNITY)
Admission: EM | Admit: 2012-09-25 | Discharge: 2012-09-26 | Disposition: A | Discharge status: Home / Self Care | Payer: Self-pay | Attending: Emergency Medicine | Admitting: Emergency Medicine

## 2012-09-25 ENCOUNTER — Encounter (HOSPITAL_COMMUNITY): Payer: Self-pay | Admitting: Emergency Medicine

## 2012-09-25 ENCOUNTER — Emergency Department (HOSPITAL_COMMUNITY): Payer: Self-pay

## 2012-09-25 DIAGNOSIS — F17200 Nicotine dependence, unspecified, uncomplicated: Secondary | ICD-10-CM | POA: Insufficient documentation

## 2012-09-25 DIAGNOSIS — F329 Major depressive disorder, single episode, unspecified: Secondary | ICD-10-CM | POA: Insufficient documentation

## 2012-09-25 DIAGNOSIS — F3289 Other specified depressive episodes: Secondary | ICD-10-CM | POA: Insufficient documentation

## 2012-09-25 DIAGNOSIS — Z8619 Personal history of other infectious and parasitic diseases: Secondary | ICD-10-CM | POA: Insufficient documentation

## 2012-09-25 DIAGNOSIS — F1120 Opioid dependence, uncomplicated: Secondary | ICD-10-CM | POA: Insufficient documentation

## 2012-09-25 DIAGNOSIS — R109 Unspecified abdominal pain: Secondary | ICD-10-CM

## 2012-09-25 DIAGNOSIS — R112 Nausea with vomiting, unspecified: Secondary | ICD-10-CM | POA: Insufficient documentation

## 2012-09-25 DIAGNOSIS — Z8719 Personal history of other diseases of the digestive system: Secondary | ICD-10-CM | POA: Insufficient documentation

## 2012-09-25 DIAGNOSIS — I10 Essential (primary) hypertension: Secondary | ICD-10-CM | POA: Insufficient documentation

## 2012-09-25 DIAGNOSIS — Z79899 Other long term (current) drug therapy: Secondary | ICD-10-CM | POA: Insufficient documentation

## 2012-09-25 DIAGNOSIS — R1084 Generalized abdominal pain: Secondary | ICD-10-CM | POA: Insufficient documentation

## 2012-09-25 DIAGNOSIS — Z3202 Encounter for pregnancy test, result negative: Secondary | ICD-10-CM | POA: Insufficient documentation

## 2012-09-25 DIAGNOSIS — K219 Gastro-esophageal reflux disease without esophagitis: Secondary | ICD-10-CM | POA: Insufficient documentation

## 2012-09-25 DIAGNOSIS — F1210 Cannabis abuse, uncomplicated: Secondary | ICD-10-CM | POA: Insufficient documentation

## 2012-09-25 DIAGNOSIS — F411 Generalized anxiety disorder: Secondary | ICD-10-CM | POA: Insufficient documentation

## 2012-09-25 HISTORY — DX: Other chronic pain: G89.29

## 2012-09-25 HISTORY — DX: Unspecified abdominal pain: R10.9

## 2012-09-25 LAB — CBC WITH DIFFERENTIAL/PLATELET
Basophils Relative: 1 % (ref 0–1)
Hemoglobin: 13.7 g/dL (ref 12.0–15.0)
Lymphs Abs: 2.5 10*3/uL (ref 0.7–4.0)
Monocytes Relative: 14 % — ABNORMAL HIGH (ref 3–12)
Neutro Abs: 3.1 10*3/uL (ref 1.7–7.7)
Neutrophils Relative %: 46 % (ref 43–77)
RBC: 4.45 MIL/uL (ref 3.87–5.11)

## 2012-09-25 LAB — URINALYSIS, ROUTINE W REFLEX MICROSCOPIC
Leukocytes, UA: NEGATIVE
Nitrite: NEGATIVE
Specific Gravity, Urine: 1.025 (ref 1.005–1.030)
pH: 7.5 (ref 5.0–8.0)

## 2012-09-25 LAB — COMPREHENSIVE METABOLIC PANEL
ALT: 9 U/L (ref 0–35)
Albumin: 4.5 g/dL (ref 3.5–5.2)
Alkaline Phosphatase: 92 U/L (ref 39–117)
BUN: 12 mg/dL (ref 6–23)
Chloride: 101 mEq/L (ref 96–112)
Glucose, Bld: 109 mg/dL — ABNORMAL HIGH (ref 70–99)
Potassium: 3.9 mEq/L (ref 3.5–5.1)
Total Bilirubin: 0.2 mg/dL — ABNORMAL LOW (ref 0.3–1.2)

## 2012-09-25 LAB — LIPASE, BLOOD: Lipase: 67 U/L — ABNORMAL HIGH (ref 11–59)

## 2012-09-25 MED ORDER — IOHEXOL 300 MG/ML  SOLN
50.0000 mL | Freq: Once | INTRAMUSCULAR | Status: AC | PRN
  Administered 2012-09-25: 50 mL via ORAL

## 2012-09-25 MED ORDER — MORPHINE SULFATE 4 MG/ML IJ SOLN
4.0000 mg | INTRAMUSCULAR | Status: AC | PRN
  Administered 2012-09-25 (×2): 4 mg via INTRAVENOUS
  Filled 2012-09-25 (×2): qty 1

## 2012-09-25 MED ORDER — IOHEXOL 300 MG/ML  SOLN: 100.0000 mL | Freq: Once | INTRAMUSCULAR | Status: DC | PRN

## 2012-09-25 MED ORDER — ONDANSETRON HCL 4 MG/2ML IJ SOLN
4.0000 mg | INTRAMUSCULAR | Status: DC | PRN
  Administered 2012-09-25: 4 mg via INTRAVENOUS
  Filled 2012-09-25: qty 2

## 2012-09-25 MED ORDER — IOHEXOL 300 MG/ML  SOLN
100.0000 mL | Freq: Once | INTRAMUSCULAR | Status: AC | PRN
  Administered 2012-09-25: 100 mL via INTRAVENOUS

## 2012-09-25 MED ORDER — FAMOTIDINE IN NACL 20-0.9 MG/50ML-% IV SOLN
20.0000 mg | Freq: Once | INTRAVENOUS | Status: AC
  Administered 2012-09-25: 20 mg via INTRAVENOUS
  Filled 2012-09-25: qty 50

## 2012-09-25 MED ORDER — SODIUM CHLORIDE 0.9 % IV SOLN
INTRAVENOUS | Status: DC
  Administered 2012-09-25: 20:00:00 via INTRAVENOUS
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
## 2012-09-25 NOTE — ED Notes (Signed)
Patient states she was discharged here 3 days ago after being treated for pancreatitis. States abdominal pain and vomiting started last night and she is unable to keep any medications down.

## 2012-10-18 ENCOUNTER — Emergency Department (HOSPITAL_COMMUNITY)
Admission: EM | Admit: 2012-10-18 | Discharge: 2012-10-18 | Disposition: A | Discharge status: Home / Self Care | Payer: Self-pay | Source: Home / Self Care

## 2012-10-18 ENCOUNTER — Encounter (HOSPITAL_COMMUNITY): Payer: Self-pay | Admitting: *Deleted

## 2012-10-18 ENCOUNTER — Emergency Department (HOSPITAL_COMMUNITY)
Admission: EM | Admit: 2012-10-18 | Discharge: 2012-10-18 | Disposition: A | Discharge status: Home / Self Care | Payer: Self-pay | Attending: Emergency Medicine | Admitting: Emergency Medicine

## 2012-10-18 DIAGNOSIS — Z8619 Personal history of other infectious and parasitic diseases: Secondary | ICD-10-CM | POA: Insufficient documentation

## 2012-10-18 DIAGNOSIS — Z3202 Encounter for pregnancy test, result negative: Secondary | ICD-10-CM | POA: Insufficient documentation

## 2012-10-18 DIAGNOSIS — R079 Chest pain, unspecified: Secondary | ICD-10-CM | POA: Insufficient documentation

## 2012-10-18 DIAGNOSIS — F1120 Opioid dependence, uncomplicated: Secondary | ICD-10-CM | POA: Insufficient documentation

## 2012-10-18 DIAGNOSIS — G8929 Other chronic pain: Secondary | ICD-10-CM | POA: Insufficient documentation

## 2012-10-18 DIAGNOSIS — F17200 Nicotine dependence, unspecified, uncomplicated: Secondary | ICD-10-CM | POA: Insufficient documentation

## 2012-10-18 DIAGNOSIS — F329 Major depressive disorder, single episode, unspecified: Secondary | ICD-10-CM | POA: Insufficient documentation

## 2012-10-18 DIAGNOSIS — I10 Essential (primary) hypertension: Secondary | ICD-10-CM | POA: Insufficient documentation

## 2012-10-18 DIAGNOSIS — F411 Generalized anxiety disorder: Secondary | ICD-10-CM | POA: Insufficient documentation

## 2012-10-18 DIAGNOSIS — K219 Gastro-esophageal reflux disease without esophagitis: Secondary | ICD-10-CM | POA: Insufficient documentation

## 2012-10-18 DIAGNOSIS — R109 Unspecified abdominal pain: Secondary | ICD-10-CM | POA: Insufficient documentation

## 2012-10-18 DIAGNOSIS — F3289 Other specified depressive episodes: Secondary | ICD-10-CM | POA: Insufficient documentation

## 2012-10-18 DIAGNOSIS — Z8719 Personal history of other diseases of the digestive system: Secondary | ICD-10-CM | POA: Insufficient documentation

## 2012-10-18 DIAGNOSIS — Z79899 Other long term (current) drug therapy: Secondary | ICD-10-CM | POA: Insufficient documentation

## 2012-10-18 DIAGNOSIS — Z8739 Personal history of other diseases of the musculoskeletal system and connective tissue: Secondary | ICD-10-CM | POA: Insufficient documentation

## 2012-10-18 DIAGNOSIS — F1210 Cannabis abuse, uncomplicated: Secondary | ICD-10-CM | POA: Insufficient documentation

## 2012-10-18 LAB — URINALYSIS, ROUTINE W REFLEX MICROSCOPIC
Leukocytes, UA: NEGATIVE
Nitrite: NEGATIVE
Specific Gravity, Urine: 1.01 (ref 1.005–1.030)
Urobilinogen, UA: 0.2 mg/dL (ref 0.0–1.0)
pH: 7 (ref 5.0–8.0)

## 2012-10-18 LAB — COMPREHENSIVE METABOLIC PANEL
Albumin: 4.8 g/dL (ref 3.5–5.2)
BUN: 9 mg/dL (ref 6–23)
Chloride: 99 mEq/L (ref 96–112)
Creatinine, Ser: 0.59 mg/dL (ref 0.50–1.10)
GFR calc Af Amer: >90 mL/min (ref >90)
GFR calc non Af Amer: >90 mL/min (ref >90)
Glucose, Bld: 106 mg/dL — ABNORMAL HIGH (ref 70–99)
Total Bilirubin: 0.4 mg/dL (ref 0.3–1.2)

## 2012-10-18 LAB — CBC WITH DIFFERENTIAL/PLATELET
Basophils Absolute: 0 10*3/uL (ref 0.0–0.1)
Lymphocytes Relative: 37 % (ref 12–46)
Lymphs Abs: 3.2 10*3/uL (ref 0.7–4.0)
Neutro Abs: 4.6 10*3/uL (ref 1.7–7.7)
Neutrophils Relative %: 53 % (ref 43–77)
Platelets: 338 10*3/uL (ref 150–400)
RBC: 4.7 MIL/uL (ref 3.87–5.11)
RDW: 14.9 % (ref 11.5–15.5)
WBC: 8.6 10*3/uL (ref 4.0–10.5)

## 2012-10-18 LAB — LIPASE, BLOOD: Lipase: 99 U/L — ABNORMAL HIGH (ref 11–59)

## 2012-10-18 MED ORDER — ONDANSETRON HCL 4 MG/2ML IJ SOLN
4.0000 mg | Freq: Once | INTRAMUSCULAR | Status: AC
  Administered 2012-10-18: 4 mg via INTRAVENOUS
  Filled 2012-10-18 (×2): qty 2

## 2012-10-18 MED ORDER — HYDROMORPHONE HCL PF 1 MG/ML IJ SOLN
1.0000 mg | Freq: Once | INTRAMUSCULAR | Status: AC
  Administered 2012-10-18: 1 mg via INTRAVENOUS
  Filled 2012-10-18: qty 1

## 2012-10-18 MED ORDER — ONDANSETRON 8 MG PO TBDP
8.0000 mg | ORAL_TABLET | Freq: Once | ORAL | Status: AC
  Administered 2012-10-18: 8 mg via ORAL
  Filled 2012-10-18: qty 1

## 2012-10-18 MED ORDER — OXYCODONE-ACETAMINOPHEN 5-325 MG PO TABS
2.0000 | ORAL_TABLET | Freq: Once | ORAL | Status: DC
  Filled 2012-10-18: qty 2
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
## 2012-10-18 NOTE — ED Notes (Signed)
Pt reports RUQ abdominal pain.  Was seen on 5/50 for pancreatitis, and states pain is the same.  Pt has been unable to get appointment with Dr Ruthe Mannan at [facility].

## 2012-10-18 NOTE — ED Notes (Signed)
Pt reporting increased pain after getting up to use restroom.  Medicated for pain.  Will wait to evaluate effectiveness prior to discharge.

## 2012-10-25 ENCOUNTER — Emergency Department (HOSPITAL_COMMUNITY)
Admission: EM | Admit: 2012-10-25 | Discharge: 2012-10-25 | Disposition: A | Discharge status: Home / Self Care | Payer: Self-pay | Attending: Emergency Medicine | Admitting: Emergency Medicine

## 2012-10-25 ENCOUNTER — Encounter (HOSPITAL_COMMUNITY): Payer: Self-pay | Admitting: *Deleted

## 2012-10-25 DIAGNOSIS — Z8739 Personal history of other diseases of the musculoskeletal system and connective tissue: Secondary | ICD-10-CM | POA: Insufficient documentation

## 2012-10-25 DIAGNOSIS — F1120 Opioid dependence, uncomplicated: Secondary | ICD-10-CM | POA: Insufficient documentation

## 2012-10-25 DIAGNOSIS — I10 Essential (primary) hypertension: Secondary | ICD-10-CM | POA: Insufficient documentation

## 2012-10-25 DIAGNOSIS — F3289 Other specified depressive episodes: Secondary | ICD-10-CM | POA: Insufficient documentation

## 2012-10-25 DIAGNOSIS — F411 Generalized anxiety disorder: Secondary | ICD-10-CM | POA: Insufficient documentation

## 2012-10-25 DIAGNOSIS — K219 Gastro-esophageal reflux disease without esophagitis: Secondary | ICD-10-CM | POA: Insufficient documentation

## 2012-10-25 DIAGNOSIS — Z79899 Other long term (current) drug therapy: Secondary | ICD-10-CM | POA: Insufficient documentation

## 2012-10-25 DIAGNOSIS — F17200 Nicotine dependence, unspecified, uncomplicated: Secondary | ICD-10-CM | POA: Insufficient documentation

## 2012-10-25 DIAGNOSIS — R109 Unspecified abdominal pain: Secondary | ICD-10-CM

## 2012-10-25 DIAGNOSIS — Z8619 Personal history of other infectious and parasitic diseases: Secondary | ICD-10-CM | POA: Insufficient documentation

## 2012-10-25 DIAGNOSIS — Z8719 Personal history of other diseases of the digestive system: Secondary | ICD-10-CM | POA: Insufficient documentation

## 2012-10-25 DIAGNOSIS — R1013 Epigastric pain: Secondary | ICD-10-CM | POA: Insufficient documentation

## 2012-10-25 DIAGNOSIS — F1210 Cannabis abuse, uncomplicated: Secondary | ICD-10-CM | POA: Insufficient documentation

## 2012-10-25 DIAGNOSIS — F329 Major depressive disorder, single episode, unspecified: Secondary | ICD-10-CM | POA: Insufficient documentation

## 2012-10-25 LAB — CBC WITH DIFFERENTIAL/PLATELET
Basophils Absolute: 0 10*3/uL (ref 0.0–0.1)
Basophils Relative: 0 % (ref 0–1)
Eosinophils Absolute: 0.1 10*3/uL (ref 0.0–0.7)
Eosinophils Relative: 1 % (ref 0–5)
MCH: 30.8 pg (ref 26.0–34.0)
MCHC: 34.4 g/dL (ref 30.0–36.0)
MCV: 89.6 fL (ref 78.0–100.0)
Neutrophils Relative %: 58 % (ref 43–77)
Platelets: 372 10*3/uL (ref 150–400)
RBC: 5 MIL/uL (ref 3.87–5.11)
RDW: 15.1 % (ref 11.5–15.5)

## 2012-10-25 LAB — COMPREHENSIVE METABOLIC PANEL
ALT: 16 U/L (ref 0–35)
Albumin: 4.9 g/dL (ref 3.5–5.2)
Alkaline Phosphatase: 95 U/L (ref 39–117)
Calcium: 10.3 mg/dL (ref 8.4–10.5)
GFR calc Af Amer: >90 mL/min (ref >90)
Glucose, Bld: 109 mg/dL — ABNORMAL HIGH (ref 70–99)
Potassium: 3.8 mEq/L (ref 3.5–5.1)
Sodium: 138 mEq/L (ref 135–145)
Total Protein: 8.1 g/dL (ref 6.0–8.3)

## 2012-10-25 LAB — URINALYSIS, ROUTINE W REFLEX MICROSCOPIC
Bilirubin Urine: NEGATIVE
Hgb urine dipstick: NEGATIVE
Nitrite: NEGATIVE
Specific Gravity, Urine: 1.015 (ref 1.005–1.030)
Urobilinogen, UA: 0.2 mg/dL (ref 0.0–1.0)
pH: 8 (ref 5.0–8.0)

## 2012-10-25 MED ORDER — ONDANSETRON HCL 4 MG/2ML IJ SOLN
4.0000 mg | INTRAMUSCULAR | Status: DC | PRN
  Administered 2012-10-25 (×2): 4 mg via INTRAVENOUS
  Filled 2012-10-25 (×2): qty 2

## 2012-10-25 MED ORDER — HYDROCODONE-ACETAMINOPHEN 5-325 MG PO TABS
2.0000 | ORAL_TABLET | Freq: Once | ORAL | Status: AC
  Administered 2012-10-25: 2 via ORAL
  Filled 2012-10-25: qty 2

## 2012-10-25 MED ORDER — SODIUM CHLORIDE 0.9 % IV BOLUS (SEPSIS)
1000.0000 mL | Freq: Once | INTRAVENOUS | Status: AC
  Administered 2012-10-25: 1000 mL via INTRAVENOUS

## 2012-10-25 MED ORDER — HYDROCODONE-ACETAMINOPHEN 5-325 MG PO TABS: 1.0000 | ORAL_TABLET | ORAL | Status: DC | PRN

## 2012-10-25 MED ORDER — PROMETHAZINE HCL 25 MG RE SUPP: 25.0000 mg | Freq: Four times a day (QID) | RECTAL | Status: DC | PRN

## 2012-10-25 MED ORDER — HYDROMORPHONE HCL PF 1 MG/ML IJ SOLN
1.0000 mg | INTRAMUSCULAR | Status: DC | PRN
  Administered 2012-10-25 (×2): 1 mg via INTRAVENOUS
  Filled 2012-10-25 (×2): qty 1

## 2012-10-25 MED ORDER — SODIUM CHLORIDE 0.9 % IV SOLN
INTRAVENOUS | Status: DC
  Administered 2012-10-25: 18:00:00 via INTRAVENOUS

## 2012-10-25 MED ORDER — DIPHENHYDRAMINE HCL 50 MG/ML IJ SOLN
12.5000 mg | Freq: Once | INTRAMUSCULAR | Status: AC
  Administered 2012-10-25: 12.5 mg via INTRAVENOUS
  Filled 2012-10-25: qty 1
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
## 2012-10-25 NOTE — ED Notes (Signed)
Pt c/o abdominal pain which is a chronic issue that she states became worse this morning. Pt has hx of pancreatitis. Pt reports vomiting multiple times this morning. Pt describes abdominal pain as aching and turning. Pt also reports acid reflux after vomiting this morning.

## 2012-10-25 NOTE — ED Notes (Signed)
Pt c/o upper abd pain with n/v/ and indigestion since approx 2 am this morning.  Reports was admitted in May for Pancreatitis.

## 2012-10-27 LAB — URINE CULTURE: Culture: NO GROWTH

## 2012-10-30 ENCOUNTER — Encounter (HOSPITAL_COMMUNITY): Payer: Self-pay | Admitting: *Deleted

## 2012-10-30 ENCOUNTER — Emergency Department (HOSPITAL_COMMUNITY)
Admission: EM | Admit: 2012-10-30 | Discharge: 2012-10-30 | Disposition: A | Discharge status: Home / Self Care | Payer: Self-pay | Attending: Emergency Medicine | Admitting: Emergency Medicine

## 2012-10-30 DIAGNOSIS — K219 Gastro-esophageal reflux disease without esophagitis: Secondary | ICD-10-CM | POA: Insufficient documentation

## 2012-10-30 DIAGNOSIS — R1013 Epigastric pain: Secondary | ICD-10-CM | POA: Insufficient documentation

## 2012-10-30 DIAGNOSIS — Z8659 Personal history of other mental and behavioral disorders: Secondary | ICD-10-CM | POA: Insufficient documentation

## 2012-10-30 DIAGNOSIS — Z8739 Personal history of other diseases of the musculoskeletal system and connective tissue: Secondary | ICD-10-CM | POA: Insufficient documentation

## 2012-10-30 DIAGNOSIS — Z79899 Other long term (current) drug therapy: Secondary | ICD-10-CM | POA: Insufficient documentation

## 2012-10-30 DIAGNOSIS — Z8719 Personal history of other diseases of the digestive system: Secondary | ICD-10-CM | POA: Insufficient documentation

## 2012-10-30 DIAGNOSIS — F17200 Nicotine dependence, unspecified, uncomplicated: Secondary | ICD-10-CM | POA: Insufficient documentation

## 2012-10-30 DIAGNOSIS — I10 Essential (primary) hypertension: Secondary | ICD-10-CM | POA: Insufficient documentation

## 2012-10-30 DIAGNOSIS — Z8619 Personal history of other infectious and parasitic diseases: Secondary | ICD-10-CM | POA: Insufficient documentation

## 2012-10-30 DIAGNOSIS — G8929 Other chronic pain: Secondary | ICD-10-CM | POA: Insufficient documentation

## 2012-10-30 DIAGNOSIS — R112 Nausea with vomiting, unspecified: Secondary | ICD-10-CM | POA: Insufficient documentation

## 2012-10-30 DIAGNOSIS — R109 Unspecified abdominal pain: Secondary | ICD-10-CM

## 2012-10-30 LAB — CBC WITH DIFFERENTIAL/PLATELET
Basophils Relative: 0 % (ref 0–1)
Eosinophils Absolute: 0.2 10*3/uL (ref 0.0–0.7)
Eosinophils Relative: 3 % (ref 0–5)
Hemoglobin: 14.1 g/dL (ref 12.0–15.0)
Lymphs Abs: 3 10*3/uL (ref 0.7–4.0)
MCH: 31 pg (ref 26.0–34.0)
MCHC: 34 g/dL (ref 30.0–36.0)
MCV: 91.2 fL (ref 78.0–100.0)
Monocytes Relative: 8 % (ref 3–12)
RBC: 4.55 MIL/uL (ref 3.87–5.11)

## 2012-10-30 LAB — COMPREHENSIVE METABOLIC PANEL
BUN: 15 mg/dL (ref 6–23)
Calcium: 10.3 mg/dL (ref 8.4–10.5)
Creatinine, Ser: 0.66 mg/dL (ref 0.50–1.10)
GFR calc Af Amer: >90 mL/min (ref >90)
Glucose, Bld: 106 mg/dL — ABNORMAL HIGH (ref 70–99)
Total Protein: 8.1 g/dL (ref 6.0–8.3)

## 2012-10-30 LAB — LIPASE, BLOOD: Lipase: 118 U/L — ABNORMAL HIGH (ref 11–59)

## 2012-10-30 MED ORDER — SODIUM CHLORIDE 0.9 % IV BOLUS (SEPSIS)
1000.0000 mL | Freq: Once | INTRAVENOUS | Status: AC
  Administered 2012-10-30: 1000 mL via INTRAVENOUS

## 2012-10-30 MED ORDER — HYDROMORPHONE HCL PF 1 MG/ML IJ SOLN
INTRAMUSCULAR | Status: AC
  Filled 2012-10-30: qty 1

## 2012-10-30 MED ORDER — ONDANSETRON HCL 4 MG/2ML IJ SOLN
4.0000 mg | Freq: Once | INTRAMUSCULAR | Status: AC
  Administered 2012-10-30: 4 mg via INTRAVENOUS
  Filled 2012-10-30: qty 2

## 2012-10-30 MED ORDER — LORAZEPAM 2 MG/ML IJ SOLN
1.0000 mg | Freq: Once | INTRAMUSCULAR | Status: AC
  Administered 2012-10-30: 1 mg via INTRAVENOUS
  Filled 2012-10-30: qty 1

## 2012-10-30 MED ORDER — PROMETHAZINE HCL 25 MG PO TABS: 25.0000 mg | ORAL_TABLET | Freq: Four times a day (QID) | ORAL | Status: DC | PRN

## 2012-10-30 MED ORDER — LORAZEPAM 2 MG/ML IJ SOLN
INTRAMUSCULAR | Status: AC
  Filled 2012-10-30: qty 1

## 2012-10-30 MED ORDER — HYDROMORPHONE HCL PF 1 MG/ML IJ SOLN
1.0000 mg | Freq: Once | INTRAMUSCULAR | Status: AC
  Administered 2012-10-30: 1 mg via INTRAVENOUS
  Filled 2012-10-30: qty 1

## 2012-10-30 MED ORDER — LORAZEPAM 2 MG/ML IJ SOLN
1.0000 mg | Freq: Once | INTRAMUSCULAR | Status: AC
  Administered 2012-10-30: 1 mg via INTRAVENOUS

## 2012-10-30 MED ORDER — DIPHENHYDRAMINE HCL 50 MG/ML IJ SOLN
25.0000 mg | Freq: Once | INTRAMUSCULAR | Status: AC
  Administered 2012-10-30: 25 mg via INTRAVENOUS
  Filled 2012-10-30: qty 1

## 2012-10-30 MED ORDER — HYDROCODONE-ACETAMINOPHEN 5-325 MG PO TABS: 1.0000 | ORAL_TABLET | Freq: Four times a day (QID) | ORAL | Status: DC | PRN

## 2012-10-30 MED ORDER — HYDROMORPHONE HCL PF 1 MG/ML IJ SOLN
1.0000 mg | Freq: Once | INTRAMUSCULAR | Status: AC
  Administered 2012-10-30: 1 mg via INTRAVENOUS
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
## 2012-10-30 NOTE — ED Notes (Signed)
abd pain, N/V

## 2012-11-03 ENCOUNTER — Emergency Department (HOSPITAL_COMMUNITY): Payer: Self-pay

## 2012-11-03 ENCOUNTER — Encounter (HOSPITAL_COMMUNITY): Payer: Self-pay | Admitting: *Deleted

## 2012-11-03 ENCOUNTER — Inpatient Hospital Stay (HOSPITAL_COMMUNITY)
Admission: EM | Admit: 2012-11-03 | Discharge: 2012-11-08 | DRG: 439 | Disposition: A | Discharge status: Home / Self Care | Payer: MEDICAID | Attending: Family Medicine | Admitting: Family Medicine

## 2012-11-03 DIAGNOSIS — K859 Acute pancreatitis without necrosis or infection, unspecified: Principal | ICD-10-CM | POA: Diagnosis present

## 2012-11-03 DIAGNOSIS — F1210 Cannabis abuse, uncomplicated: Secondary | ICD-10-CM | POA: Diagnosis present

## 2012-11-03 DIAGNOSIS — E876 Hypokalemia: Secondary | ICD-10-CM | POA: Diagnosis present

## 2012-11-03 DIAGNOSIS — K920 Hematemesis: Secondary | ICD-10-CM | POA: Diagnosis present

## 2012-11-03 DIAGNOSIS — D649 Anemia, unspecified: Secondary | ICD-10-CM | POA: Diagnosis present

## 2012-11-03 DIAGNOSIS — G8929 Other chronic pain: Secondary | ICD-10-CM | POA: Diagnosis present

## 2012-11-03 DIAGNOSIS — F17200 Nicotine dependence, unspecified, uncomplicated: Secondary | ICD-10-CM

## 2012-11-03 DIAGNOSIS — Q453 Other congenital malformations of pancreas and pancreatic duct: Secondary | ICD-10-CM

## 2012-11-03 DIAGNOSIS — Z825 Family history of asthma and other chronic lower respiratory diseases: Secondary | ICD-10-CM

## 2012-11-03 DIAGNOSIS — K219 Gastro-esophageal reflux disease without esophagitis: Secondary | ICD-10-CM | POA: Diagnosis present

## 2012-11-03 DIAGNOSIS — L299 Pruritus, unspecified: Secondary | ICD-10-CM | POA: Diagnosis present

## 2012-11-03 DIAGNOSIS — I10 Essential (primary) hypertension: Secondary | ICD-10-CM | POA: Diagnosis present

## 2012-11-03 DIAGNOSIS — R1115 Cyclical vomiting syndrome unrelated to migraine: Secondary | ICD-10-CM

## 2012-11-03 DIAGNOSIS — R112 Nausea with vomiting, unspecified: Secondary | ICD-10-CM

## 2012-11-03 DIAGNOSIS — Z8249 Family history of ischemic heart disease and other diseases of the circulatory system: Secondary | ICD-10-CM

## 2012-11-03 DIAGNOSIS — R109 Unspecified abdominal pain: Secondary | ICD-10-CM

## 2012-11-03 DIAGNOSIS — F1120 Opioid dependence, uncomplicated: Secondary | ICD-10-CM | POA: Diagnosis present

## 2012-11-03 DIAGNOSIS — Z720 Tobacco use: Secondary | ICD-10-CM | POA: Diagnosis present

## 2012-11-03 DIAGNOSIS — IMO0002 Reserved for concepts with insufficient information to code with codable children: Secondary | ICD-10-CM | POA: Diagnosis present

## 2012-11-03 DIAGNOSIS — Z833 Family history of diabetes mellitus: Secondary | ICD-10-CM

## 2012-11-03 DIAGNOSIS — F3289 Other specified depressive episodes: Secondary | ICD-10-CM | POA: Diagnosis present

## 2012-11-03 DIAGNOSIS — F411 Generalized anxiety disorder: Secondary | ICD-10-CM | POA: Diagnosis present

## 2012-11-03 DIAGNOSIS — K861 Other chronic pancreatitis: Secondary | ICD-10-CM | POA: Diagnosis present

## 2012-11-03 DIAGNOSIS — F329 Major depressive disorder, single episode, unspecified: Secondary | ICD-10-CM | POA: Diagnosis present

## 2012-11-03 DIAGNOSIS — T40605A Adverse effect of unspecified narcotics, initial encounter: Secondary | ICD-10-CM | POA: Diagnosis present

## 2012-11-03 HISTORY — DX: Unspecified abdominal pain: R10.9

## 2012-11-03 LAB — CBC WITH DIFFERENTIAL/PLATELET
Basophils Absolute: 0 10*3/uL (ref 0.0–0.1)
Lymphocytes Relative: 28 % (ref 12–46)
Neutro Abs: 5.2 10*3/uL (ref 1.7–7.7)
Platelets: 343 10*3/uL (ref 150–400)
RDW: 15 % (ref 11.5–15.5)
WBC: 8.5 10*3/uL (ref 4.0–10.5)

## 2012-11-03 LAB — COMPREHENSIVE METABOLIC PANEL
ALT: 9 U/L (ref 0–35)
AST: 14 U/L (ref 0–37)
CO2: 28 mEq/L (ref 19–32)
Chloride: 102 mEq/L (ref 96–112)
GFR calc non Af Amer: >90 mL/min (ref >90)
Sodium: 139 mEq/L (ref 135–145)
Total Bilirubin: 0.2 mg/dL — ABNORMAL LOW (ref 0.3–1.2)

## 2012-11-03 LAB — URINALYSIS, ROUTINE W REFLEX MICROSCOPIC
Hgb urine dipstick: NEGATIVE
Nitrite: NEGATIVE
Specific Gravity, Urine: 1.02 (ref 1.005–1.030)
Urobilinogen, UA: 0.2 mg/dL (ref 0.0–1.0)

## 2012-11-03 LAB — CBC
HCT: 34.8 % — ABNORMAL LOW (ref 36.0–46.0)
Hemoglobin: 11.9 g/dL — ABNORMAL LOW (ref 12.0–15.0)
MCH: 30.8 pg (ref 26.0–34.0)
MCV: 90.2 fL (ref 78.0–100.0)
RBC: 3.86 MIL/uL — ABNORMAL LOW (ref 3.87–5.11)

## 2012-11-03 LAB — PREGNANCY, URINE: Preg Test, Ur: NEGATIVE

## 2012-11-03 MED ORDER — HYDROMORPHONE HCL PF 1 MG/ML IJ SOLN
1.0000 mg | Freq: Once | INTRAMUSCULAR | Status: AC
  Administered 2012-11-03: 1 mg via INTRAVENOUS
  Filled 2012-11-03: qty 1

## 2012-11-03 MED ORDER — ALBUTEROL SULFATE HFA 108 (90 BASE) MCG/ACT IN AERS: 2.0000 | INHALATION_SPRAY | Freq: Four times a day (QID) | RESPIRATORY_TRACT | Status: DC | PRN

## 2012-11-03 MED ORDER — SODIUM CHLORIDE 0.9 % IV BOLUS (SEPSIS)
1000.0000 mL | Freq: Once | INTRAVENOUS | Status: AC
  Administered 2012-11-03: 1000 mL via INTRAVENOUS

## 2012-11-03 MED ORDER — SODIUM CHLORIDE 0.9 % IV SOLN: INTRAVENOUS | Status: DC

## 2012-11-03 MED ORDER — PANTOPRAZOLE SODIUM 40 MG IV SOLR
40.0000 mg | Freq: Two times a day (BID) | INTRAVENOUS | Status: DC
  Administered 2012-11-03 – 2012-11-08 (×10): 40 mg via INTRAVENOUS
  Filled 2012-11-03 (×10): qty 40

## 2012-11-03 MED ORDER — ONDANSETRON HCL 4 MG/2ML IJ SOLN
4.0000 mg | Freq: Three times a day (TID) | INTRAMUSCULAR | Status: DC | PRN
  Administered 2012-11-03: 4 mg via INTRAVENOUS

## 2012-11-03 MED ORDER — FENTANYL CITRATE 0.05 MG/ML IJ SOLN
100.0000 ug | INTRAMUSCULAR | Status: AC | PRN
  Administered 2012-11-03 (×2): 100 ug via INTRAVENOUS
  Filled 2012-11-03 (×2): qty 2

## 2012-11-03 MED ORDER — FENTANYL CITRATE 0.05 MG/ML IJ SOLN
100.0000 ug | INTRAMUSCULAR | Status: DC | PRN
  Administered 2012-11-03: 100 ug via INTRAVENOUS
  Filled 2012-11-03: qty 2

## 2012-11-03 MED ORDER — ONDANSETRON HCL 4 MG/2ML IJ SOLN
4.0000 mg | Freq: Four times a day (QID) | INTRAMUSCULAR | Status: DC | PRN
  Administered 2012-11-04 – 2012-11-08 (×17): 4 mg via INTRAVENOUS
  Filled 2012-11-03 (×18): qty 2

## 2012-11-03 MED ORDER — HYDROMORPHONE HCL PF 1 MG/ML IJ SOLN
1.0000 mg | INTRAMUSCULAR | Status: DC | PRN
  Administered 2012-11-03 – 2012-11-07 (×29): 1 mg via INTRAVENOUS
  Filled 2012-11-03 (×30): qty 1

## 2012-11-03 MED ORDER — ONDANSETRON HCL 4 MG/2ML IJ SOLN
4.0000 mg | INTRAMUSCULAR | Status: AC | PRN
  Administered 2012-11-03 (×2): 4 mg via INTRAVENOUS
  Filled 2012-11-03 (×2): qty 2

## 2012-11-03 MED ORDER — FAMOTIDINE IN NACL 20-0.9 MG/50ML-% IV SOLN
20.0000 mg | Freq: Once | INTRAVENOUS | Status: AC
  Administered 2012-11-03: 20 mg via INTRAVENOUS
  Filled 2012-11-03: qty 50

## 2012-11-03 MED ORDER — SODIUM CHLORIDE 0.9 % IV SOLN
INTRAVENOUS | Status: DC
  Administered 2012-11-03: 17:00:00 via INTRAVENOUS

## 2012-11-03 MED ORDER — ONDANSETRON HCL 4 MG PO TABS: 4.0000 mg | ORAL_TABLET | Freq: Four times a day (QID) | ORAL | Status: DC | PRN

## 2012-11-03 MED ORDER — NICOTINE 14 MG/24HR TD PT24
14.0000 mg | MEDICATED_PATCH | Freq: Every day | TRANSDERMAL | Status: DC
  Administered 2012-11-03 – 2012-11-08 (×6): 14 mg via TRANSDERMAL
  Filled 2012-11-03 (×6): qty 1

## 2012-11-03 MED ORDER — ACETAMINOPHEN 650 MG RE SUPP: 650.0000 mg | Freq: Four times a day (QID) | RECTAL | Status: DC | PRN

## 2012-11-03 MED ORDER — ACETAMINOPHEN 325 MG PO TABS: 650.0000 mg | ORAL_TABLET | Freq: Four times a day (QID) | ORAL | Status: DC | PRN

## 2012-11-03 MED ORDER — ONDANSETRON HCL 4 MG/2ML IJ SOLN
INTRAMUSCULAR | Status: AC
  Filled 2012-11-03: qty 2
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
## 2012-11-03 NOTE — ED Notes (Signed)
Awakened by nausea and vomiting this A.M.  Vomiting has been almost continuous until 1300.  No dry heaving.  Sharp shooting abdominal pain radiating to mid back.
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
## 2012-11-04 DIAGNOSIS — K920 Hematemesis: Secondary | ICD-10-CM

## 2012-11-04 DIAGNOSIS — E876 Hypokalemia: Secondary | ICD-10-CM

## 2012-11-04 LAB — CBC
HCT: 32.6 % — ABNORMAL LOW (ref 36.0–46.0)
MCH: 30 pg (ref 26.0–34.0)
MCV: 89.8 fL (ref 78.0–100.0)
RDW: 15 % (ref 11.5–15.5)
WBC: 6.7 10*3/uL (ref 4.0–10.5)

## 2012-11-04 LAB — COMPREHENSIVE METABOLIC PANEL
Albumin: 3.5 g/dL (ref 3.5–5.2)
BUN: 10 mg/dL (ref 6–23)
Calcium: 8.7 mg/dL (ref 8.4–10.5)
Chloride: 104 mEq/L (ref 96–112)
Creatinine, Ser: 0.51 mg/dL (ref 0.50–1.10)
GFR calc non Af Amer: >90 mL/min (ref >90)
Total Bilirubin: 0.2 mg/dL — ABNORMAL LOW (ref 0.3–1.2)

## 2012-11-04 LAB — LIPASE, BLOOD: Lipase: 208 U/L — ABNORMAL HIGH (ref 11–59)

## 2012-11-04 MED ORDER — POTASSIUM CHLORIDE IN NACL 40-0.9 MEQ/L-% IV SOLN
INTRAVENOUS | Status: DC
  Administered 2012-11-04 – 2012-11-05 (×3): via INTRAVENOUS

## 2012-11-04 MED ORDER — DIPHENHYDRAMINE HCL 50 MG/ML IJ SOLN
12.5000 mg | INTRAMUSCULAR | Status: DC | PRN
  Administered 2012-11-04 – 2012-11-08 (×14): 12.5 mg via INTRAVENOUS
  Filled 2012-11-04 (×14): qty 1

## 2012-11-04 NOTE — Progress Notes (Signed)
[facility] PROGRESS NOTE  [name] NWG:[id number] DOB: July 05, 1985 DOA: 11/03/2012 PCP: Dr. Ruthe Mannan at [facility] clinic GI: Dr. Rudean Hitt at [facility]    Code Status: Full code. Family Communication: Discussed with husband. Disposition Plan: Discharge to home when clinically improved.   Consultants:  None  Procedures:  None  Antibiotics:  None  HPI/Subjective: The patient is still having nausea and occasional vomiting. IV hydromorphone is decreasing her pain. No bowel movement in 2 days. She does have itching with IV hydromorphone, but she does not want it discontinued. Benadryl has helped in the past.  Objective: Filed Vitals:   11/03/12 1431 11/03/12 1815 11/03/12 2018 11/04/12 0621  BP: 100/63 124/81 140/81 104/71  Pulse: 97 79 72 62  Temp: 98 F (36.7 C)  98.2 F (36.8 C) 97.9 F (36.6 C)  TempSrc:   Oral Oral  Resp: 20 16 20 20   Height: 5\' 5"  (1.651 m)     Weight: 43.092 kg (95 lb)  45.8 kg (100 lb 15.5 oz)   SpO2: 100% 100% 100% 100%   No intake or output data in the 24 hours ending 11/04/12 1100 Filed Weights   11/03/12 1431 11/03/12 2018  Weight: 43.092 kg (95 lb) 45.8 kg (100 lb 15.5 oz)    Exam:   General:  Thin/small framed 27 year old Caucasian woman who is sitting up in bed, in no acute distress.  Cardiovascular: S1, S2, with no murmurs rubs or gallops.  Respiratory: Coarse breath sounds, otherwise clear.  Abdomen: Flat, hypoactive bowel sounds, soft, mildly to moderately tender in the epigastrium; no rebound, guarding, or rigidity.  Musculoskeletal: No acute hot red joints. No pedal edema.  Neurological/psychological: She is alert and oriented x3. No signs of anxiousness. Her speech is clear. Affect is pleasant.   Data Reviewed: Basic Metabolic Panel:  Recent Labs Lab 10/30/12 1933 11/03/12 1624 11/04/12 0453  NA 141 139 136  K 4.3 3.8 3.4*  CL 103 102 104  CO2 30 28 25   GLUCOSE 106* 107* 98  BUN 15 14 10    CREATININE 0.66 0.50 0.51  CALCIUM 10.3 9.9 8.7   Liver Function Tests:  Recent Labs Lab 10/30/12 1933 11/03/12 1624 11/04/12 0453  AST 16 14 13   ALT 14 9 9   ALKPHOS 93 86 62  BILITOT 0.1* 0.2* 0.2*  PROT 8.1 7.9 5.8*  ALBUMIN 4.8 4.7 3.5    Recent Labs Lab 10/30/12 1933 11/03/12 1624 11/04/12 0453  LIPASE 118* 295* 208*  AMYLASE 91  --   --    No results found for this basename: AMMONIA,  in the last 168 hours CBC:  Recent Labs Lab 10/30/12 1933 11/03/12 1624 11/03/12 2030 11/04/12 0453  WBC 8.7 8.5 8.7 6.7  NEUTROABS 4.7 5.2  --   --   HGB 14.1 14.1 11.9* 10.9*  HCT 41.5 40.5 34.8* 32.6*  MCV 91.2 89.4 90.2 89.8  PLT 298 343 249 220   Cardiac Enzymes: No results found for this basename: CKTOTAL, CKMB, CKMBINDEX, TROPONINI,  in the last 168 hours BNP (last 3 results) No results found for this basename: PROBNP,  in the last 8760 hours CBG: No results found for this basename: GLUCAP,  in the last 168 hours  Recent Results (from the past 240 hour(s))  URINE CULTURE     Status: None   Collection Time    10/25/12  4:41 PM      Result Value Range Status   Specimen Description URINE, CLEAN CATCH   Final  Special Requests NONE   Final   Culture  Setup Time 10/25/2012 18:20   Final   Colony Count NO GROWTH   Final   Culture NO GROWTH   Final   Report Status 10/27/2012 FINAL   Final     Studies: Dg Abd Acute W/chest  11/03/2012   *RADIOLOGY REPORT*  Clinical Data: Vomiting and abdominal pain.  ACUTE ABDOMEN SERIES (ABDOMEN 2 VIEW & CHEST 1 VIEW)  Comparison: Chest and two views abdomen 04/08/2012 and CT abdomen and pelvis 09/25/2012.  Findings: Single view of the chest demonstrates clear lungs and normal heart size.  No pneumothorax or pleural fluid.  Two views of the abdomen show a normal bowel gas pattern.  No free intraperitoneal air is identified.  No abnormal calcifications seen.  IMPRESSION: Negative examination.   Original Report Authenticated By:  Holley Dexter, M.D.    Scheduled Meds: . nicotine  14 mg Transdermal Daily  . pantoprazole (PROTONIX) IV  40 mg Intravenous Q12H   Continuous Infusions: . 0.9 % NaCl with KCl 40 mEq / L 100 mL/hr at 11/04/12 1478     Assessment:  Active Problems:   Pancreas divisum   Pancreatitis, recurrent   Anemia   Tobacco abuse   Generalized anxiety disorder   Acute pancreatitis   Hematemesis   1. Pancreatic divisum/recurrent pancreatitis/acute pancreatitis. We'll continue supportive treatment with IV fluids, IV analgesics, antiemetics, and IV PPI. We'll follow lipase and clinical course before starting the diet.  Mild hematemesis. Now resolved. Continue IV PPI.   Chronic normocytic anemia. Decrease in her hemoglobin is hemodilution oh from IV fluids. Less likely from GI bleeding. Her hemoglobin will continue to be monitored. Next  Tobacco abuse. She was advised to stop smoking. Continue nicotine patch.  Pruritus. Likely secondary to hydromorphone. We'll add when necessary IV Benadryl.  Hypokalemia.   Plan: 1. As above. 2. We'll change IV fluids add more potassium chloride. 3. Add 40 mEq of potassium chloride to IV fluids.    Time spent: 30 minutes    Ji Fairburn  [facility] Pager [phone] If 7PM-7AM, please contact night-coverage at [email or web address], password [facility] 11/04/2012, 11:00 AM  LOS: 1 day

## 2012-11-05 DIAGNOSIS — Q453 Other congenital malformations of pancreas and pancreatic duct: Secondary | ICD-10-CM

## 2012-11-05 LAB — CBC
Platelets: 230 10*3/uL (ref 150–400)
RDW: 15.7 % — ABNORMAL HIGH (ref 11.5–15.5)
WBC: 5.5 10*3/uL (ref 4.0–10.5)

## 2012-11-05 LAB — LIPASE, BLOOD: Lipase: 222 U/L — ABNORMAL HIGH (ref 11–59)

## 2012-11-05 LAB — BASIC METABOLIC PANEL
Calcium: 9.2 mg/dL (ref 8.4–10.5)
Chloride: 107 mEq/L (ref 96–112)
Creatinine, Ser: 0.53 mg/dL (ref 0.50–1.10)
GFR calc Af Amer: >90 mL/min (ref >90)

## 2012-11-05 MED ORDER — KCL IN DEXTROSE-NACL 20-5-0.9 MEQ/L-%-% IV SOLN
INTRAVENOUS | Status: DC
Start: 2012-11-05 — End: 2012-11-08
  Administered 2012-11-05: 11:00:00 via INTRAVENOUS
  Administered 2012-11-06: 70 mL/h via INTRAVENOUS
  Administered 2012-11-06 – 2012-11-08 (×3): via INTRAVENOUS

## 2012-11-05 MED ORDER — BENZONATATE 100 MG PO CAPS: 100.0000 mg | ORAL_CAPSULE | Freq: Three times a day (TID) | ORAL | Status: DC | PRN

## 2012-11-05 MED ORDER — HYDROMORPHONE HCL PF 1 MG/ML IJ SOLN
0.5000 mg | Freq: Once | INTRAMUSCULAR | Status: AC
  Administered 2012-11-05: 0.5 mg via INTRAVENOUS
  Filled 2012-11-05: qty 1

## 2012-11-05 NOTE — Progress Notes (Signed)
[facility] PROGRESS NOTE  [name] NGE:[id number] DOB: Aug 16, 1985 DOA: 11/03/2012 PCP: Dr. Ruthe Dunn at [facility] clinic GI: Dr. Rudean Dunn at [facility]    Code Status: Full code. Family Communication:  Disposition Plan: Discharge to home when clinically improved.   Consultants:  None  Procedures:  None  Antibiotics:  None  Subjective: The patient says that she is still hurting with epigastric abdominal pain. Mild nausea. She believes that hunger pains may be causing some of her discomfort.  Objective: Filed Vitals:   11/04/12 2256 11/05/12 0542 11/05/12 1213 11/05/12 1400  BP: 105/81 106/74 108/81 107/68  Pulse: 73 73  76  Temp: 98.1 F (36.7 C) 97.6 F (36.4 C)  97.8 F (36.6 C)  TempSrc: Oral Oral  Oral  Resp: 20 20  20   Height:      Weight:      SpO2: 100% 100%  100%    Intake/Output Summary (Last 24 hours) at 11/05/12 1652 Last data filed at 11/05/12 1200  Gross per 24 hour  Intake      0 ml  Output      0 ml  Net      0 ml   Filed Weights   11/03/12 1431 11/03/12 2018  Weight: 43.092 kg (95 lb) 45.8 kg (100 lb 15.5 oz)    Exam:   General:  Thin/small framed 27 year old Caucasian woman who is sitting up in bed, in no acute distress.  Cardiovascular: S1, S2, with no murmurs rubs or gallops.  Respiratory: Coarse breath sounds, otherwise clear.  Abdomen: Flat, hypoactive bowel sounds, soft, mildly to moderately tender in the epigastrium; no rebound, guarding, or rigidity.  Musculoskeletal: No acute hot red joints. No pedal edema.  Neurological/psychological: She is alert and oriented x3. No signs of anxiousness. Her speech is clear. Affect is pleasant.   Data Reviewed: Basic Metabolic Panel:  Recent Labs Lab 10/30/12 1933 11/03/12 1624 11/04/12 0453 11/05/12 0718  NA 141 139 136 140  K 4.3 3.8 3.4* 3.9  CL 103 102 104 107  CO2 30 28 25 29   GLUCOSE 106* 107* 98 95  BUN 15 14 10  3*  CREATININE 0.66 0.50 0.51 0.53   CALCIUM 10.3 9.9 8.7 9.2   Liver Function Tests:  Recent Labs Lab 10/30/12 1933 11/03/12 1624 11/04/12 0453  AST 16 14 13   ALT 14 9 9   ALKPHOS 93 86 62  BILITOT 0.1* 0.2* 0.2*  PROT 8.1 7.9 5.8*  ALBUMIN 4.8 4.7 3.5    Recent Labs Lab 10/30/12 1933 11/03/12 1624 11/04/12 0453 11/05/12 0718  LIPASE 118* 295* 208* 222*  AMYLASE 91  --   --   --    No results found for this basename: AMMONIA,  in the last 168 hours CBC:  Recent Labs Lab 10/30/12 1933 11/03/12 1624 11/03/12 2030 11/04/12 0453 11/05/12 0718  WBC 8.7 8.5 8.7 6.7 5.5  NEUTROABS 4.7 5.2  --   --   --   HGB 14.1 14.1 11.9* 10.9* 11.1*  HCT 41.5 40.5 34.8* 32.6* 33.4*  MCV 91.2 89.4 90.2 89.8 91.5  PLT 298 343 249 220 230   Cardiac Enzymes: No results found for this basename: CKTOTAL, CKMB, CKMBINDEX, TROPONINI,  in the last 168 hours BNP (last 3 results) No results found for this basename: PROBNP,  in the last 8760 hours CBG: No results found for this basename: GLUCAP,  in the last 168 hours  No results found for this or any previous visit (from the  past 240 hour(s)).   Studies: Dg Abd Acute W/chest  11/03/2012   *RADIOLOGY REPORT*  Clinical Data: Vomiting and abdominal pain.  ACUTE ABDOMEN SERIES (ABDOMEN 2 VIEW & CHEST 1 VIEW)  Comparison: Chest and two views abdomen 04/08/2012 and CT abdomen and pelvis 09/25/2012.  Findings: Single view of the chest demonstrates clear lungs and normal heart size.  No pneumothorax or pleural fluid.  Two views of the abdomen show a normal bowel gas pattern.  No free intraperitoneal air is identified.  No abnormal calcifications seen.  IMPRESSION: Negative examination.   Original Report Authenticated By: Holley Dexter, M.D.    Scheduled Meds: . nicotine  14 mg Transdermal Daily  . pantoprazole (PROTONIX) IV  40 mg Intravenous Q12H   Continuous Infusions: . dextrose 5 % and 0.9 % NaCl with KCl 20 mEq/L 70 mL/hr at 11/05/12 1109     Assessment:  Active  Problems:   Pancreas divisum   Pancreatitis, recurrent   Anemia   Tobacco abuse   Hypokalemia   Generalized anxiety disorder   Acute pancreatitis   Hematemesis   1. Pancreatic divisum/recurrent pancreatitis/acute pancreatitis. We'll continue supportive treatment with IV fluids, IV analgesics, antiemetics, and IV PPI. We'll follow lipase and clinical course before starting the diet.  Mild hematemesis. Now resolved. Continue IV PPI.   Chronic normocytic anemia. Decrease in her hemoglobin is hemodilution oh from IV fluids. Less likely from GI bleeding. Her hemoglobin will continue to be monitored. Next  Tobacco abuse. She was advised to stop smoking. Continue nicotine patch.  Pruritus. Likely secondary to hydromorphone. We'll add when necessary IV Benadryl.  Hypokalemia. Repleting.  Plan:  1. We'll allow ice chips and sips of clear liquids. 2. Followup lipase in the morning.   Time spent: 30 minutes    [name]  [facility] Pager [phone] If 7PM-7AM, please contact night-coverage at [email or web address], password [facility] 11/05/2012, 4:52 PM  LOS: 2 days

## 2012-11-06 DIAGNOSIS — D649 Anemia, unspecified: Secondary | ICD-10-CM

## 2012-11-06 LAB — BASIC METABOLIC PANEL
GFR calc Af Amer: >90 mL/min (ref >90)
GFR calc non Af Amer: >90 mL/min (ref >90)
Potassium: 3.3 mEq/L — ABNORMAL LOW (ref 3.5–5.1)
Sodium: 139 mEq/L (ref 135–145)

## 2012-11-06 MED ORDER — HYDROMORPHONE HCL PF 1 MG/ML IJ SOLN
1.0000 mg | Freq: Once | INTRAMUSCULAR | Status: AC
  Administered 2012-11-06: 1 mg via INTRAVENOUS

## 2012-11-06 NOTE — Progress Notes (Signed)
[facility] PROGRESS NOTE  [name] HQI:[id number] DOB: 07/06/1985 DOA: 11/03/2012 PCP: Dr. Ruthe Mannan at [facility] GI: Dr. Rudean Hitt at [facility]    Code Status: Full code. Family Communication:  Disposition Plan: Discharge to home when clinically improved.   Consultants:  None  Procedures:  None  Antibiotics:  None  Subjective: The patient tolerated clear liquids started this morning. She has minimal abdominal pain. The nursing staff says that she is still asking for IV Dilaudid at least every 3-4 hours.  Objective: Filed Vitals:   11/05/12 1400 11/05/12 2244 11/06/12 0500 11/06/12 1516  BP: 107/68 121/79 110/77 104/60  Pulse: 76 78 90 89  Temp: 97.8 F (36.6 C) 98.3 F (36.8 C) 97.9 F (36.6 C) 98.1 F (36.7 C)  TempSrc: Oral Oral Oral Oral  Resp: 20   20  Height:      Weight:      SpO2: 100% 96% 99% 98%    Intake/Output Summary (Last 24 hours) at 11/06/12 1626 Last data filed at 11/05/12 1800  Gross per 24 hour  Intake      0 ml  Output      0 ml  Net      0 ml   Filed Weights   11/03/12 1431 11/03/12 2018  Weight: 43.092 kg (95 lb) 45.8 kg (100 lb 15.5 oz)    Exam:   General:  Thin/small framed 27 year old Caucasian woman who is sitting up in bed, in no acute distress.  Cardiovascular: S1, S2, with no murmurs rubs or gallops.  Respiratory: Coarse breath sounds, otherwise clear.  Abdomen: Flat, hypoactive bowel sounds, soft, mildly to moderately tender in the epigastrium; no rebound, guarding, or rigidity.  Musculoskeletal: No acute hot red joints. No pedal edema.  Neurological/psychological: She is alert and oriented x3. No signs of anxiousness. Her speech is clear. Affect is pleasant.   Data Reviewed: Basic Metabolic Panel:  Recent Labs Lab 10/30/12 1933 11/03/12 1624 11/04/12 0453 11/05/12 0718 11/06/12 0500  NA 141 139 136 140 139  K 4.3 3.8 3.4* 3.9 3.3*  CL 103 102 104 107 102  CO2 30 28 25 29 29    GLUCOSE 106* 107* 98 95 149*  BUN 15 14 10  3* 3*  CREATININE 0.66 0.50 0.51 0.53 0.60  CALCIUM 10.3 9.9 8.7 9.2 9.5   Liver Function Tests:  Recent Labs Lab 10/30/12 1933 11/03/12 1624 11/04/12 0453  AST 16 14 13   ALT 14 9 9   ALKPHOS 93 86 62  BILITOT 0.1* 0.2* 0.2*  PROT 8.1 7.9 5.8*  ALBUMIN 4.8 4.7 3.5    Recent Labs Lab 10/30/12 1933 11/03/12 1624 11/04/12 0453 11/05/12 0718 11/06/12 0500  LIPASE 118* 295* 208* 222* 199*  AMYLASE 91  --   --   --   --    No results found for this basename: AMMONIA,  in the last 168 hours CBC:  Recent Labs Lab 10/30/12 1933 11/03/12 1624 11/03/12 2030 11/04/12 0453 11/05/12 0718  WBC 8.7 8.5 8.7 6.7 5.5  NEUTROABS 4.7 5.2  --   --   --   HGB 14.1 14.1 11.9* 10.9* 11.1*  HCT 41.5 40.5 34.8* 32.6* 33.4*  MCV 91.2 89.4 90.2 89.8 91.5  PLT 298 343 249 220 230   Cardiac Enzymes: No results found for this basename: CKTOTAL, CKMB, CKMBINDEX, TROPONINI,  in the last 168 hours BNP (last 3 results) No results found for this basename: PROBNP,  in the last 8760 hours CBG: No results  found for this basename: GLUCAP,  in the last 168 hours  No results found for this or any previous visit (from the past 240 hour(s)).   Studies: No results found.  Scheduled Meds: . nicotine  14 mg Transdermal Daily  . pantoprazole (PROTONIX) IV  40 mg Intravenous Q12H   Continuous Infusions: . dextrose 5 % and 0.9 % NaCl with KCl 20 mEq/L 70 mL/hr (11/06/12 1610)     Assessment:  Active Problems:   Pancreas divisum   Pancreatitis, recurrent   Anemia   Tobacco abuse   Hypokalemia   Generalized anxiety disorder   Acute pancreatitis   Hematemesis   1. Pancreatic divisum/recurrent pancreatitis/acute pancreatitis. Her lipase has not improved much, but she appears to be improving clinically. We'll continue supportive treatment with IV fluids, IV analgesics, antiemetics, and IV PPI.  Mild hematemesis. Now resolved. Continue IV PPI.    Chronic normocytic anemia. Decrease in her hemoglobin is hemodilution oh from IV fluids. Less likely from GI bleeding. Her hemoglobin will continue to be monitored. Next  Tobacco abuse. She was advised to stop smoking. Continue nicotine patch.  Pruritus. Likely secondary to hydromorphone. We'll add when necessary IV Benadryl.  Hypokalemia. Continue repletion in the IV fluids.  Plan:  1.Continue clear liquids and supportive treatment. 2. Followup lipase in the morning.   Time spent: 30 minutes    [name]  [facility] Pager [phone] If 7PM-7AM, please contact night-coverage at [email or web address], password [facility] 11/06/2012, 4:26 PM  LOS: 3 days

## 2012-11-07 LAB — BASIC METABOLIC PANEL
GFR calc Af Amer: >90 mL/min (ref >90)
GFR calc non Af Amer: >90 mL/min (ref >90)
Potassium: 4.3 mEq/L (ref 3.5–5.1)
Sodium: 142 mEq/L (ref 135–145)

## 2012-11-07 MED ORDER — HYDROMORPHONE HCL PF 1 MG/ML IJ SOLN
0.5000 mg | INTRAMUSCULAR | Status: DC | PRN
Start: 2012-11-07 — End: 2012-11-08
  Administered 2012-11-07 – 2012-11-08 (×8): 0.5 mg via INTRAVENOUS
  Filled 2012-11-07 (×8): qty 1

## 2012-11-07 NOTE — Care Management Note (Signed)
    Page 1 of 1   11/08/2012     2:19:47 PM   CARE MANAGEMENT NOTE 11/08/2012  Patient:  [name], [name]   Account Number:  [id number]  Date Initiated:  11/07/2012  Documentation initiated by:  Rosemary Holms  Subjective/Objective Assessment:   Pt admitted from home where she lives with her spouse. Pt currently up walking. Anticipate DC soon. Pt received MATCH voucher on previous adm. so she will not qualify for this admission.     Action/Plan:   Anticipated DC Date:  11/08/2012   Anticipated DC Plan:  HOME/SELF CARE      DC Planning Services  CM consult      Choice offered to / List presented to:             Status of service:  Completed, signed off Medicare Important Message given?   (If response is "NO", the following Medicare IM given date fields will be blank) Date Medicare IM given:   Date Additional Medicare IM given:    Discharge Disposition:  HOME/SELF CARE  Per UR Regulation:    If discussed at Long Length of Stay Meetings, dates discussed:    Comments:  11/07/12 Rosemary Holms RN BSN CM

## 2012-11-07 NOTE — Progress Notes (Signed)
[facility] PROGRESS NOTE  Taegen Lennox ZOX:[id number] DOB: 12-16-1985 DOA: 11/03/2012 PCP: Dr. Ruthe Mannan at [facility] GI: Dr. Rudean Hitt at [facility]    Code Status: Full code. Family Communication:  Disposition Plan: Discharge to home when clinically improved.   Consultants:  None  Procedures:  None  Antibiotics:  None  Subjective: The patient tolerated clear liquids started this morning. She wants to try Foley close. She has minimal abdominal pain. The nursing staff says that she is still asking for IV Dilaudid at least every 3-4 hours.  Objective: Filed Vitals:   11/06/12 1516 11/06/12 2217 11/07/12 0645 11/07/12 1352  BP: 104/60 104/66 103/68 104/67  Pulse: 89 72 67 73  Temp: 98.1 F (36.7 C) 98 F (36.7 C) 97.9 F (36.6 C) 98.1 F (36.7 C)  TempSrc: Oral Oral Oral   Resp: 20 20 19 20   Height:      Weight:      SpO2: 98% 97% 100% 99%    Intake/Output Summary (Last 24 hours) at 11/07/12 1459 Last data filed at 11/07/12 1352  Gross per 24 hour  Intake 8265.5 ml  Output      5 ml  Net 8260.5 ml   Filed Weights   11/03/12 1431 11/03/12 2018  Weight: 43.092 kg (95 lb) 45.8 kg (100 lb 15.5 oz)    Exam:   General:  Thin/small framed 27 year old Caucasian woman who is sitting up in bed, in no acute distress.  Cardiovascular: S1, S2, with no murmurs rubs or gallops.  Respiratory: Coarse breath sounds, otherwise clear.  Abdomen: Flat, hypoactive bowel sounds, soft, mildly to moderately tender in the epigastrium; no rebound, guarding, or rigidity.  Musculoskeletal: No acute hot red joints. No pedal edema.  Neurological/psychological: She is alert and oriented x3. No signs of anxiousness. Her speech is clear. Affect is pleasant.   Data Reviewed: Basic Metabolic Panel:  Recent Labs Lab 11/03/12 1624 11/04/12 0453 11/05/12 0718 11/06/12 0500 11/07/12 0554  NA 139 136 140 139 142  K 3.8 3.4* 3.9 3.3* 4.3  CL 102 104 107 102 105   CO2 28 25 29 29  33*  GLUCOSE 107* 98 95 149* 94  BUN 14 10 3* 3* 5*  CREATININE 0.50 0.51 0.53 0.60 0.66  CALCIUM 9.9 8.7 9.2 9.5 9.3   Liver Function Tests:  Recent Labs Lab 11/03/12 1624 11/04/12 0453  AST 14 13  ALT 9 9  ALKPHOS 86 62  BILITOT 0.2* 0.2*  PROT 7.9 5.8*  ALBUMIN 4.7 3.5    Recent Labs Lab 11/03/12 1624 11/04/12 0453 11/05/12 0718 11/06/12 0500 11/07/12 0554  LIPASE 295* 208* 222* 199* 133*   No results found for this basename: AMMONIA,  in the last 168 hours CBC:  Recent Labs Lab 11/03/12 1624 11/03/12 2030 11/04/12 0453 11/05/12 0718  WBC 8.5 8.7 6.7 5.5  NEUTROABS 5.2  --   --   --   HGB 14.1 11.9* 10.9* 11.1*  HCT 40.5 34.8* 32.6* 33.4*  MCV 89.4 90.2 89.8 91.5  PLT 343 249 220 230   Cardiac Enzymes: No results found for this basename: CKTOTAL, CKMB, CKMBINDEX, TROPONINI,  in the last 168 hours BNP (last 3 results) No results found for this basename: PROBNP,  in the last 8760 hours CBG: No results found for this basename: GLUCAP,  in the last 168 hours  No results found for this or any previous visit (from the past 240 hour(s)).   Studies: No results found.  Scheduled Meds: .  nicotine  14 mg Transdermal Daily  . pantoprazole (PROTONIX) IV  40 mg Intravenous Q12H   Continuous Infusions: . dextrose 5 % and 0.9 % NaCl with KCl 20 mEq/L 70 mL/hr at 11/07/12 1209     Assessment:  Active Problems:   Pancreas divisum   Pancreatitis, recurrent   Anemia   Tobacco abuse   Hypokalemia   Generalized anxiety disorder   Acute pancreatitis   Hematemesis   1. Pancreatic divisum/recurrent pancreatitis/acute pancreatitis. Her lipase is trending downward. We'll advance her diet to full liquids and decrease the dosing of IV hydromorphone. Continue IV Protonix and IV antiemetics.   Mild hematemesis. Now resolved. Continue IV PPI.   Chronic normocytic anemia. Decrease in her hemoglobin is hemodilution oh from IV fluids. Less likely  from GI bleeding. Her hemoglobin will continue to be monitored. Next  Tobacco abuse. She was advised to stop smoking. Continue nicotine patch.  Pruritus. Likely secondary to hydromorphone. We'll add when necessary IV Benadryl.  Hypokalemia. Continue repletion in the IV fluids.  Plan:  1. Advance diet to full liquids.  2. Followup lipase in the morning. 3. Decreased IV hydromorphone to 0.5 mg IV every 3 hours. 4. Anticipate discharge to home tomorrow if her symptoms are manageable and have her lipase is reasonable.    Time spent: 30 minutes    Tiaja Hagan  [facility] Pager [phone] If 7PM-7AM, please contact night-coverage at [email or web address], password [facility] 11/07/2012, 2:59 PM  LOS: 4 days

## 2012-11-08 DIAGNOSIS — R109 Unspecified abdominal pain: Secondary | ICD-10-CM

## 2012-11-08 LAB — BASIC METABOLIC PANEL
BUN: 5 mg/dL — ABNORMAL LOW (ref 6–23)
GFR calc Af Amer: >90 mL/min (ref >90)
GFR calc non Af Amer: >90 mL/min (ref >90)
Potassium: 4.3 mEq/L (ref 3.5–5.1)
Sodium: 140 mEq/L (ref 135–145)

## 2012-11-08 MED ORDER — HYDROCODONE-ACETAMINOPHEN 5-325 MG PO TABS: 1.0000 | ORAL_TABLET | ORAL | Status: DC | PRN

## 2012-11-08 NOTE — Discharge Summary (Signed)
Pt stated she was ready to go home and pain was under control.  Pt's IV was removed prior to DC and she was also informed that Case Management will not be able to set her up for med assist after this visit (since she had it last time). Pt given DC paper and education on pancreatitis with family in the room.  Pt will be wheeled to car by PCT and family once pt gets dressed

## 2012-11-08 NOTE — Discharge Summary (Signed)
Pt sent home with Norco script for pain

## 2012-11-08 NOTE — Progress Notes (Signed)
[facility] PROGRESS NOTE  [name] ZOX:[id number] DOB: May 20, 1985 DOA: 11/03/2012 PCP: No primary provider on file. PCP: Dr. Ruthe Mannan in [facility].  Specialists: Dr. Rudean Hitt, gastroenterologist at [facility]   Assessment/Plan: 1. Recurrent pancreatitis: Clinically much improved with decreasing lipase. Tolerating diet. Stable for discharge home. Continue to followup with her specialist at [facility]. History of pancreas divisum 2. Chronic abdominal wall pain: Appears stable. 3. Chronic intermittent normocytic anemia: Stable. Followup as an outpatient. Mild hematemesis without evidence of bleeding. Likely secondary to vomiting. No further evaluation suggested. Continue PPI 4. Cigarette smoker: Recommend cessation.   Discharge home today  Code Status: Full Family Communication: None present Disposition Plan: As above  Brendia Sacks, MD  [facility]  Pager [phone] If 7PM-7AM, please contact night-coverage at [email or web address], password [facility] 11/08/2012, 1:22 PM  LOS: 5 days   Clinical Summary: 27 year old woman with history of recurrent pancreatitis presented with upper abdominal pain, vomiting. She is followed at [facility] and are considering placing a stent in pancreatic duct in the near future. Also receiving some kind of steroid injections to the abdominal wall. She was admitted for acute pancreatitis, nausea and vomiting.  Consultants:  None   Procedures:  None   Antibiotics:  None   HPI/Subjective: Feels much better. Tolerating full liquid diet. Minimal abdominal discomfort. Would like to go home.  Objective: Filed Vitals:   11/07/12 1352 11/07/12 2100 11/08/12 0700 11/08/12 0820  BP: 104/67 102/67 91/58   Pulse: 73 75 68   Temp: 98.1 F (36.7 C) 98.5 F (36.9 C) 97.6 F (36.4 C)   TempSrc:  Oral    Resp: 20 20 20    Height:      Weight:   45.904 kg (101 lb 3.2 oz)   SpO2: 99% 100% 100% 100%    Intake/Output Summary (Last 24 hours) at  11/08/12 1322 Last data filed at 11/08/12 0800  Gross per 24 hour  Intake    840 ml  Output      0 ml  Net    840 ml     Filed Weights   11/03/12 1431 11/03/12 2018 11/08/12 0700  Weight: 43.092 kg (95 lb) 45.8 kg (100 lb 15.5 oz) 45.904 kg (101 lb 3.2 oz)    Exam:   General: Appears calm and comfortable.  Cardiovascular: Regular rate and rhythm. No murmur, rub, gallop. No lower extremity edema.  Respiratory: Clear to auscultation bilaterally. No wheezes, rales, or rhonchi. Normal respiratory effort.  Abdomen: Appears benign.  Data Reviewed:  Lipase now 121 and trending downwards. Basic metabolic panel unremarkable.  Pending studies:   None  Scheduled Meds: . nicotine  14 mg Transdermal Daily  . pantoprazole (PROTONIX) IV  40 mg Intravenous Q12H   Continuous Infusions: . dextrose 5 % and 0.9 % NaCl with KCl 20 mEq/L 70 mL/hr at 11/08/12 0117    Active Problems:   Pancreas divisum   Pancreatitis, recurrent   Anemia   Tobacco abuse   Hypokalemia   Generalized anxiety disorder   Acute pancreatitis   Hematemesis

## 2012-11-08 NOTE — Discharge Summary (Signed)
Physician Discharge Summary  [name] ZOX:[id number] DOB: 1986-03-30 DOA: 11/03/2012  PCP: No primary provider on file. PCP: Dr. Ruthe Mannan in [facility].  Specialists: Dr. Rudean Hitt, gastroenterologist at [facility] date: 11/03/2012 Discharge date: 11/08/2012  Recommendations for Outpatient Follow-up:  1. Followup resolution of recurrent pancreatitis 2. Followup chronic intermittent normocytic anemia as clinically indicated 3. Continue to encourage smoking cessation   Discharge Diagnoses:  1. Recurrent pancreatitis, acute 2. Chronic abdominal wall pain 3. Chronic intermittent normocytic anemia 4. Cigarette smoker  Discharge Condition: improved Disposition: Home  Diet recommendation: Full liquid, advance to low fat bland diet as tolerated.  Filed Weights   11/03/12 1431 11/03/12 2018 11/08/12 0700  Weight: 43.092 kg (95 lb) 45.8 kg (100 lb 15.5 oz) 45.904 kg (101 lb 3.2 oz)    History of present illness:  27 year old woman with history of recurrent pancreatitis presented with upper abdominal pain, vomiting. She is followed at [facility] and are considering placing a stent in pancreatic duct in the near future. Also receiving some kind of steroid injections to the abdominal wall. She was admitted for acute pancreatitis, nausea and vomiting.  Hospital Course:  Ms. Cue was admitted for further treatment of acute pancreatitis. With supportive care, pain control her condition gradually improved. At this point lipase continues to trend downwards, she is tolerating full liquids, has greatly reduced pain is stable for discharge. Her hospitalization was uncomplicated. Individual issues as below.  1. Recurrent pancreatitis: Clinically much improved with decreasing lipase. Tolerating diet. Stable for discharge home. Continue to followup with her specialist at [facility]. History of pancreas divisum 2. Chronic abdominal wall pain: Appears stable. 3. Chronic intermittent  normocytic anemia: Stable. Followup as an outpatient. Mild hematemesis without evidence of bleeding. Likely secondary to vomiting. No further evaluation suggested. Continue PPI 4. Cigarette smoker: Recommend cessation.  Consultants:  None  Procedures:  None  Antibiotics:  None   Discharge Instructions  Discharge Orders   Future Orders Complete By Expires     Activity as tolerated - No restrictions  As directed     Discharge instructions  As directed     Comments:      Call physician or seek immediate medical attention for recurrent abdominal pain, nausea, vomiting or worsening of condition. Continue full liquid diet, advance to bland low-fat diet as tolerated.        Medication List         albuterol 108 (90 BASE) MCG/ACT inhaler  Commonly known as:  PROVENTIL HFA;VENTOLIN HFA  Inhale 2 puffs into the lungs every 6 (six) hours as needed for wheezing.     HYDROcodone-acetaminophen 5-325 MG per tablet  Commonly known as:  NORCO  Take 1 tablet by mouth every 4 (four) hours as needed for pain.     omeprazole 20 MG capsule  Commonly known as:  PRILOSEC  Take 20 mg by mouth daily.     promethazine 25 MG tablet  Commonly known as:  PHENERGAN  Take 1 tablet (25 mg total) by mouth every 6 (six) hours as needed for nausea.     zolpidem 5 MG tablet  Commonly known as:  AMBIEN  Take 5 mg by mouth at bedtime.       Allergies  Allergen Reactions  . Bee Venom Anaphylaxis  . 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 Reglan (Metoclopramide) Other (See Comments)    Reaction:Severe anxiety    The results of significant diagnostics from this hospitalization (  including imaging, microbiology, ancillary and laboratory) are listed below for reference.    Significant Diagnostic Studies: Dg Abd Acute W/chest  11/03/2012   *RADIOLOGY REPORT*  Clinical Data: Vomiting and abdominal pain.  ACUTE ABDOMEN SERIES (ABDOMEN 2 VIEW & CHEST 1 VIEW)  Comparison: Chest and two  views abdomen 04/08/2012 and CT abdomen and pelvis 09/25/2012.  Findings: Single view of the chest demonstrates clear lungs and normal heart size.  No pneumothorax or pleural fluid.  Two views of the abdomen show a normal bowel gas pattern.  No free intraperitoneal air is identified.  No abnormal calcifications seen.  IMPRESSION: Negative examination.   Original Report Authenticated By: Holley Dexter, M.D.   Labs: Basic Metabolic Panel:  Recent Labs Lab 11/04/12 0453 11/05/12 0718 11/06/12 0500 11/07/12 0554 11/08/12 0605  NA 136 140 139 142 140  K 3.4* 3.9 3.3* 4.3 4.3  CL 104 107 102 105 102  CO2 25 29 29  33* 31  GLUCOSE 98 95 149* 94 85  BUN 10 3* 3* 5* 5*  CREATININE 0.51 0.53 0.60 0.66 0.66  CALCIUM 8.7 9.2 9.5 9.3 9.3   Liver Function Tests:  Recent Labs Lab 11/03/12 1624 11/04/12 0453  AST 14 13  ALT 9 9  ALKPHOS 86 62  BILITOT 0.2* 0.2*  PROT 7.9 5.8*  ALBUMIN 4.7 3.5    Recent Labs Lab 11/04/12 0453 11/05/12 0718 11/06/12 0500 11/07/12 0554 11/08/12 0605  LIPASE 208* 222* 199* 133* 121*   CBC:  Recent Labs Lab 11/03/12 1624 11/03/12 2030 11/04/12 0453 11/05/12 0718  WBC 8.5 8.7 6.7 5.5  NEUTROABS 5.2  --   --   --   HGB 14.1 11.9* 10.9* 11.1*  HCT 40.5 34.8* 32.6* 33.4*  MCV 89.4 90.2 89.8 91.5  PLT 343 249 220 230    Active Problems:   Pancreas divisum   Pancreatitis, recurrent   Anemia   Tobacco abuse   Hypokalemia   Generalized anxiety disorder   Acute pancreatitis   Hematemesis   Time coordinating discharge: 25 minutes  Signed:  Brendia Sacks, MD [facility] 11/08/2012, 2:04 PM

## 2012-11-08 NOTE — Plan of Care (Signed)
Problem: Phase II Progression Outcomes Goal: Discharge plan established Talked with pt about possible discharge plan

## 2012-11-11 ENCOUNTER — Encounter (HOSPITAL_COMMUNITY): Payer: Self-pay

## 2012-11-11 ENCOUNTER — Emergency Department (HOSPITAL_COMMUNITY)
Admission: EM | Admit: 2012-11-11 | Discharge: 2012-11-11 | Disposition: A | Discharge status: Home / Self Care | Payer: Self-pay | Attending: Emergency Medicine | Admitting: Emergency Medicine

## 2012-11-11 DIAGNOSIS — F17200 Nicotine dependence, unspecified, uncomplicated: Secondary | ICD-10-CM | POA: Insufficient documentation

## 2012-11-11 DIAGNOSIS — R109 Unspecified abdominal pain: Secondary | ICD-10-CM | POA: Insufficient documentation

## 2012-11-11 DIAGNOSIS — Z79899 Other long term (current) drug therapy: Secondary | ICD-10-CM | POA: Insufficient documentation

## 2012-11-11 DIAGNOSIS — K219 Gastro-esophageal reflux disease without esophagitis: Secondary | ICD-10-CM | POA: Insufficient documentation

## 2012-11-11 DIAGNOSIS — R112 Nausea with vomiting, unspecified: Secondary | ICD-10-CM | POA: Insufficient documentation

## 2012-11-11 DIAGNOSIS — G8929 Other chronic pain: Secondary | ICD-10-CM | POA: Insufficient documentation

## 2012-11-11 DIAGNOSIS — K859 Acute pancreatitis without necrosis or infection, unspecified: Secondary | ICD-10-CM | POA: Insufficient documentation

## 2012-11-11 DIAGNOSIS — R6883 Chills (without fever): Secondary | ICD-10-CM | POA: Insufficient documentation

## 2012-11-11 DIAGNOSIS — IMO0001 Reserved for inherently not codable concepts without codable children: Secondary | ICD-10-CM | POA: Insufficient documentation

## 2012-11-11 DIAGNOSIS — Z8739 Personal history of other diseases of the musculoskeletal system and connective tissue: Secondary | ICD-10-CM | POA: Insufficient documentation

## 2012-11-11 DIAGNOSIS — I10 Essential (primary) hypertension: Secondary | ICD-10-CM | POA: Insufficient documentation

## 2012-11-11 DIAGNOSIS — Z8619 Personal history of other infectious and parasitic diseases: Secondary | ICD-10-CM | POA: Insufficient documentation

## 2012-11-11 LAB — LIPASE, BLOOD: Lipase: 83 U/L — ABNORMAL HIGH (ref 11–59)

## 2012-11-11 LAB — COMPREHENSIVE METABOLIC PANEL
AST: 18 U/L (ref 0–37)
BUN: 15 mg/dL (ref 6–23)
CO2: 28 mEq/L (ref 19–32)
Calcium: 9.7 mg/dL (ref 8.4–10.5)
Chloride: 102 mEq/L (ref 96–112)
Creatinine, Ser: 0.57 mg/dL (ref 0.50–1.10)
GFR calc Af Amer: >90 mL/min (ref >90)
GFR calc non Af Amer: >90 mL/min (ref >90)
Glucose, Bld: 97 mg/dL (ref 70–99)
Total Bilirubin: 0.2 mg/dL — ABNORMAL LOW (ref 0.3–1.2)

## 2012-11-11 LAB — CBC WITH DIFFERENTIAL/PLATELET
Basophils Absolute: 0 10*3/uL (ref 0.0–0.1)
Eosinophils Relative: 5 % (ref 0–5)
Lymphocytes Relative: 45 % (ref 12–46)
Lymphs Abs: 3.1 10*3/uL (ref 0.7–4.0)
MCV: 91.1 fL (ref 78.0–100.0)
Neutro Abs: 2.7 10*3/uL (ref 1.7–7.7)
Neutrophils Relative %: 39 % — ABNORMAL LOW (ref 43–77)
Platelets: 304 10*3/uL (ref 150–400)
RBC: 4.39 MIL/uL (ref 3.87–5.11)
RDW: 15.2 % (ref 11.5–15.5)
WBC: 7 10*3/uL (ref 4.0–10.5)

## 2012-11-11 MED ORDER — PROMETHAZINE HCL 25 MG RE SUPP: 25.0000 mg | Freq: Four times a day (QID) | RECTAL | Status: DC | PRN

## 2012-11-11 MED ORDER — SODIUM CHLORIDE 0.9 % IV SOLN
INTRAVENOUS | Status: DC
  Administered 2012-11-11: 09:00:00 via INTRAVENOUS

## 2012-11-11 MED ORDER — ONDANSETRON HCL 4 MG/2ML IJ SOLN
4.0000 mg | Freq: Once | INTRAMUSCULAR | Status: AC
  Administered 2012-11-11: 4 mg via INTRAVENOUS
  Filled 2012-11-11: qty 2

## 2012-11-11 MED ORDER — HYDROMORPHONE HCL PF 1 MG/ML IJ SOLN
1.0000 mg | INTRAMUSCULAR | Status: DC | PRN
  Administered 2012-11-11 (×2): 1 mg via INTRAVENOUS
  Filled 2012-11-11 (×2): qty 1

## 2012-11-11 MED ORDER — HYDROMORPHONE HCL PF 1 MG/ML IJ SOLN
1.0000 mg | Freq: Once | INTRAMUSCULAR | Status: AC
  Administered 2012-11-11: 1 mg via INTRAVENOUS
  Filled 2012-11-11: qty 1

## 2012-11-11 MED ORDER — ONDANSETRON HCL 4 MG/2ML IJ SOLN
4.0000 mg | Freq: Four times a day (QID) | INTRAMUSCULAR | Status: DC | PRN
  Administered 2012-11-11: 4 mg via INTRAVENOUS
  Filled 2012-11-11: qty 2

## 2012-11-11 NOTE — ED Notes (Signed)
Was admitted here on the 3rd floor for chronic pancreatitis. Have been at home for 2 days per pt. Started vomiting again around 4 this morning. Tried taking my phenergan and it would not stay down per pt.

## 2012-11-11 NOTE — ED Notes (Signed)
Patient states she needs something for nausea and pain. RN made aware.

## 2012-11-11 NOTE — ED Provider Notes (Signed)
Medical screening examination/treatment/procedure(s) were performed by non-physician practitioner and as supervising physician I was immediately available for consultation/collaboration.   Carleene Cooper III, MD 11/11/12 [phone]

## 2012-11-11 NOTE — ED Notes (Signed)
Patient with no complaints at this time. Respirations even and unlabored. Skin warm/dry. Discharge instructions reviewed with patient at this time. Patient given opportunity to voice concerns/ask questions. IV removed per policy and band-aid applied to site. Patient discharged at this time and left Emergency Department with steady gait.  
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
## 2012-11-11 NOTE — ED Notes (Signed)
Resting in bed. No distress.

## 2012-11-11 NOTE — ED Notes (Signed)
Patient is resting comfortably. 

## 2012-11-13 ENCOUNTER — Encounter (HOSPITAL_COMMUNITY): Payer: Self-pay | Admitting: *Deleted

## 2012-11-13 ENCOUNTER — Emergency Department (HOSPITAL_COMMUNITY)
Admission: EM | Admit: 2012-11-13 | Discharge: 2012-11-13 | Disposition: A | Discharge status: Home / Self Care | Payer: MEDICAID | Attending: Emergency Medicine | Admitting: Emergency Medicine

## 2012-11-13 DIAGNOSIS — Z9889 Other specified postprocedural states: Secondary | ICD-10-CM | POA: Insufficient documentation

## 2012-11-13 DIAGNOSIS — K219 Gastro-esophageal reflux disease without esophagitis: Secondary | ICD-10-CM | POA: Insufficient documentation

## 2012-11-13 DIAGNOSIS — G8929 Other chronic pain: Secondary | ICD-10-CM | POA: Insufficient documentation

## 2012-11-13 DIAGNOSIS — R1013 Epigastric pain: Secondary | ICD-10-CM | POA: Insufficient documentation

## 2012-11-13 DIAGNOSIS — Z3202 Encounter for pregnancy test, result negative: Secondary | ICD-10-CM | POA: Insufficient documentation

## 2012-11-13 DIAGNOSIS — R112 Nausea with vomiting, unspecified: Secondary | ICD-10-CM | POA: Insufficient documentation

## 2012-11-13 DIAGNOSIS — Z8659 Personal history of other mental and behavioral disorders: Secondary | ICD-10-CM | POA: Insufficient documentation

## 2012-11-13 DIAGNOSIS — I10 Essential (primary) hypertension: Secondary | ICD-10-CM | POA: Insufficient documentation

## 2012-11-13 DIAGNOSIS — K861 Other chronic pancreatitis: Secondary | ICD-10-CM | POA: Insufficient documentation

## 2012-11-13 DIAGNOSIS — Z8619 Personal history of other infectious and parasitic diseases: Secondary | ICD-10-CM | POA: Insufficient documentation

## 2012-11-13 DIAGNOSIS — F17200 Nicotine dependence, unspecified, uncomplicated: Secondary | ICD-10-CM | POA: Insufficient documentation

## 2012-11-13 DIAGNOSIS — M549 Dorsalgia, unspecified: Secondary | ICD-10-CM | POA: Insufficient documentation

## 2012-11-13 DIAGNOSIS — Z79899 Other long term (current) drug therapy: Secondary | ICD-10-CM | POA: Insufficient documentation

## 2012-11-13 DIAGNOSIS — Z8739 Personal history of other diseases of the musculoskeletal system and connective tissue: Secondary | ICD-10-CM | POA: Insufficient documentation

## 2012-11-13 LAB — CBC WITH DIFFERENTIAL/PLATELET
Basophils Relative: 1 % (ref 0–1)
Eosinophils Absolute: 0.1 10*3/uL (ref 0.0–0.7)
Eosinophils Relative: 2 % (ref 0–5)
Hemoglobin: 13.8 g/dL (ref 12.0–15.0)
Lymphs Abs: 2.2 10*3/uL (ref 0.7–4.0)
MCH: 30.6 pg (ref 26.0–34.0)
MCHC: 33.9 g/dL (ref 30.0–36.0)
MCV: 90.2 fL (ref 78.0–100.0)
Monocytes Relative: 11 % (ref 3–12)
Platelets: 325 10*3/uL (ref 150–400)
RBC: 4.51 MIL/uL (ref 3.87–5.11)

## 2012-11-13 LAB — COMPREHENSIVE METABOLIC PANEL
ALT: 12 U/L (ref 0–35)
AST: 17 U/L (ref 0–37)
Alkaline Phosphatase: 86 U/L (ref 39–117)
CO2: 30 mEq/L (ref 19–32)
Calcium: 10 mg/dL (ref 8.4–10.5)
GFR calc non Af Amer: >90 mL/min (ref >90)
Glucose, Bld: 119 mg/dL — ABNORMAL HIGH (ref 70–99)
Potassium: 3.3 mEq/L — ABNORMAL LOW (ref 3.5–5.1)
Sodium: 137 mEq/L (ref 135–145)

## 2012-11-13 LAB — URINALYSIS, ROUTINE W REFLEX MICROSCOPIC
Bilirubin Urine: NEGATIVE
Glucose, UA: NEGATIVE mg/dL
Hgb urine dipstick: NEGATIVE
Protein, ur: NEGATIVE mg/dL

## 2012-11-13 MED ORDER — PROCHLORPERAZINE EDISYLATE 5 MG/ML IJ SOLN
10.0000 mg | Freq: Once | INTRAMUSCULAR | Status: AC
  Administered 2012-11-13: 10 mg via INTRAVENOUS
  Filled 2012-11-13: qty 2

## 2012-11-13 MED ORDER — HYDROMORPHONE HCL PF 2 MG/ML IJ SOLN
INTRAMUSCULAR | Status: AC
  Filled 2012-11-13: qty 1

## 2012-11-13 MED ORDER — DIPHENHYDRAMINE HCL 50 MG/ML IJ SOLN
12.5000 mg | Freq: Once | INTRAMUSCULAR | Status: AC
  Administered 2012-11-13: 12.5 mg via INTRAVENOUS
  Filled 2012-11-13: qty 1

## 2012-11-13 MED ORDER — HYDROMORPHONE HCL PF 1 MG/ML IJ SOLN
1.0000 mg | Freq: Once | INTRAMUSCULAR | Status: AC
  Administered 2012-11-13: 1 mg via INTRAVENOUS
  Filled 2012-11-13: qty 1

## 2012-11-13 MED ORDER — HYDROMORPHONE HCL PF 2 MG/ML IJ SOLN
2.0000 mg | Freq: Once | INTRAMUSCULAR | Status: AC
  Administered 2012-11-13: 2 mg via INTRAVENOUS
  Filled 2012-11-13: qty 1

## 2012-11-13 MED ORDER — KETAMINE HCL 10 MG/ML IJ SOLN
5.0000 mg | Freq: Once | INTRAMUSCULAR | Status: AC
  Administered 2012-11-13: 5 mg via INTRAVENOUS
  Filled 2012-11-13: qty 1

## 2012-11-13 MED ORDER — KETAMINE HCL 10 MG/ML IJ SOLN
5.0000 mg | Freq: Once | INTRAMUSCULAR | Status: AC
  Administered 2012-11-13: 5 mg via INTRAVENOUS

## 2012-11-13 MED ORDER — PROMETHAZINE HCL 25 MG/ML IJ SOLN
25.0000 mg | Freq: Once | INTRAMUSCULAR | Status: AC
  Administered 2012-11-13: 25 mg via INTRAVENOUS
  Filled 2012-11-13: qty 1

## 2012-11-13 MED ORDER — HYDROCODONE-ACETAMINOPHEN 5-325 MG PO TABS: 1.0000 | ORAL_TABLET | Freq: Four times a day (QID) | ORAL | Status: DC | PRN

## 2012-11-13 MED ORDER — ONDANSETRON HCL 4 MG/2ML IJ SOLN
8.0000 mg | Freq: Once | INTRAMUSCULAR | Status: AC
Start: 2012-11-13 — End: 2012-11-13
  Administered 2012-11-13: 8 mg via INTRAVENOUS
  Filled 2012-11-13: qty 2

## 2012-11-13 MED ORDER — HYDROMORPHONE HCL PF 2 MG/ML IJ SOLN
2.0000 mg | Freq: Once | INTRAMUSCULAR | Status: AC
  Administered 2012-11-13: 2 mg via INTRAVENOUS

## 2012-11-13 MED ORDER — LACTATED RINGERS IV BOLUS (SEPSIS)
1000.0000 mL | Freq: Once | INTRAVENOUS | Status: AC
  Administered 2012-11-13: 1000 mL via INTRAVENOUS
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
## 2012-11-13 NOTE — ED Notes (Signed)
Pt given 8 of zofran in a 50ml bag of D50. Pt now requesting more pain meds.

## 2012-11-13 NOTE — ED Notes (Signed)
Pt woke up an hour ago vomiting yellow bile that was bright red tinged. Pt also c/o abdominal pain that radiates to her back.

## 2012-11-16 ENCOUNTER — Encounter (HOSPITAL_COMMUNITY): Payer: Self-pay | Admitting: Emergency Medicine

## 2012-11-16 ENCOUNTER — Inpatient Hospital Stay (HOSPITAL_COMMUNITY)
Admission: EM | Admit: 2012-11-16 | Discharge: 2012-11-18 | DRG: 439 | Disposition: A | Discharge status: Home / Self Care | Payer: MEDICAID | Attending: Internal Medicine | Admitting: Internal Medicine

## 2012-11-16 DIAGNOSIS — K861 Other chronic pancreatitis: Secondary | ICD-10-CM | POA: Diagnosis present

## 2012-11-16 DIAGNOSIS — I10 Essential (primary) hypertension: Secondary | ICD-10-CM | POA: Diagnosis present

## 2012-11-16 DIAGNOSIS — R112 Nausea with vomiting, unspecified: Secondary | ICD-10-CM

## 2012-11-16 DIAGNOSIS — F1210 Cannabis abuse, uncomplicated: Secondary | ICD-10-CM | POA: Diagnosis present

## 2012-11-16 DIAGNOSIS — G8929 Other chronic pain: Secondary | ICD-10-CM | POA: Diagnosis present

## 2012-11-16 DIAGNOSIS — F17200 Nicotine dependence, unspecified, uncomplicated: Secondary | ICD-10-CM | POA: Diagnosis present

## 2012-11-16 DIAGNOSIS — E876 Hypokalemia: Secondary | ICD-10-CM | POA: Diagnosis present

## 2012-11-16 DIAGNOSIS — K219 Gastro-esophageal reflux disease without esophagitis: Secondary | ICD-10-CM | POA: Diagnosis present

## 2012-11-16 DIAGNOSIS — Q453 Other congenital malformations of pancreas and pancreatic duct: Secondary | ICD-10-CM

## 2012-11-16 DIAGNOSIS — K859 Acute pancreatitis without necrosis or infection, unspecified: Principal | ICD-10-CM | POA: Diagnosis present

## 2012-11-16 DIAGNOSIS — E440 Moderate protein-calorie malnutrition: Secondary | ICD-10-CM | POA: Insufficient documentation

## 2012-11-16 DIAGNOSIS — F1120 Opioid dependence, uncomplicated: Secondary | ICD-10-CM | POA: Diagnosis present

## 2012-11-16 DIAGNOSIS — Z681 Body mass index (BMI) 19 or less, adult: Secondary | ICD-10-CM

## 2012-11-16 DIAGNOSIS — R109 Unspecified abdominal pain: Secondary | ICD-10-CM | POA: Diagnosis present

## 2012-11-16 LAB — POCT I-STAT, CHEM 8
BUN: 14 mg/dL (ref 6–23)
Calcium, Ion: 1.23 mmol/L (ref 1.12–1.23)
Chloride: 108 mEq/L (ref 96–112)
Glucose, Bld: 103 mg/dL — ABNORMAL HIGH (ref 70–99)
HCT: 39 % (ref 36.0–46.0)

## 2012-11-16 LAB — CBC
HCT: 40.1 % (ref 36.0–46.0)
Hemoglobin: 13.6 g/dL (ref 12.0–15.0)
MCH: 30.8 pg (ref 26.0–34.0)
MCHC: 33.9 g/dL (ref 30.0–36.0)

## 2012-11-16 MED ORDER — ONDANSETRON HCL 4 MG/2ML IJ SOLN
4.0000 mg | Freq: Once | INTRAMUSCULAR | Status: AC
  Administered 2012-11-16: 4 mg via INTRAVENOUS
  Filled 2012-11-16: qty 2

## 2012-11-16 MED ORDER — HYDROMORPHONE HCL PF 1 MG/ML IJ SOLN
1.0000 mg | Freq: Once | INTRAMUSCULAR | Status: AC
  Administered 2012-11-16: 1 mg via INTRAVENOUS
  Filled 2012-11-16: qty 1

## 2012-11-16 MED ORDER — ENOXAPARIN SODIUM 40 MG/0.4ML ~~LOC~~ SOLN
40.0000 mg | SUBCUTANEOUS | Status: DC
  Filled 2012-11-16: qty 0.4

## 2012-11-16 MED ORDER — DIPHENHYDRAMINE HCL 50 MG/ML IJ SOLN
25.0000 mg | Freq: Once | INTRAMUSCULAR | Status: AC
  Administered 2012-11-16: 25 mg via INTRAVENOUS
  Filled 2012-11-16: qty 1

## 2012-11-16 MED ORDER — SODIUM CHLORIDE 0.9 % IJ SOLN
INTRAMUSCULAR | Status: AC
  Administered 2012-11-16: 10 mL
  Filled 2012-11-16: qty 3

## 2012-11-16 MED ORDER — POTASSIUM CHLORIDE IN NACL 20-0.9 MEQ/L-% IV SOLN
INTRAVENOUS | Status: DC
  Administered 2012-11-16 – 2012-11-17 (×6): via INTRAVENOUS
  Administered 2012-11-18: 125 mL/h via INTRAVENOUS

## 2012-11-16 MED ORDER — PANTOPRAZOLE SODIUM 40 MG IV SOLR
40.0000 mg | INTRAVENOUS | Status: DC
  Administered 2012-11-16 – 2012-11-17 (×2): 40 mg via INTRAVENOUS
  Filled 2012-11-16 (×2): qty 40

## 2012-11-16 MED ORDER — DIPHENHYDRAMINE HCL 25 MG PO CAPS: 25.0000 mg | ORAL_CAPSULE | Freq: Four times a day (QID) | ORAL | Status: DC | PRN

## 2012-11-16 MED ORDER — OXYCODONE HCL ER 20 MG PO T12A
20.0000 mg | EXTENDED_RELEASE_TABLET | Freq: Two times a day (BID) | ORAL | Status: DC
  Administered 2012-11-16 – 2012-11-18 (×5): 20 mg via ORAL
  Filled 2012-11-16 (×4): qty 1

## 2012-11-16 MED ORDER — SODIUM CHLORIDE 0.9 % IV BOLUS (SEPSIS)
1000.0000 mL | Freq: Once | INTRAVENOUS | Status: AC
  Administered 2012-11-16: 1000 mL via INTRAVENOUS

## 2012-11-16 MED ORDER — OXYCODONE HCL 5 MG PO TABS
10.0000 mg | ORAL_TABLET | ORAL | Status: DC | PRN
Start: 2012-11-16 — End: 2012-11-18
  Administered 2012-11-16 – 2012-11-18 (×6): 10 mg via ORAL
  Filled 2012-11-16 (×6): qty 2

## 2012-11-16 MED ORDER — PROMETHAZINE HCL 25 MG/ML IJ SOLN
12.5000 mg | Freq: Four times a day (QID) | INTRAMUSCULAR | Status: DC | PRN
Start: 2012-11-16 — End: 2012-11-18
  Administered 2012-11-16 – 2012-11-17 (×6): 25 mg via INTRAVENOUS
  Filled 2012-11-16 (×7): qty 1

## 2012-11-16 MED ORDER — DIPHENHYDRAMINE HCL 50 MG/ML IJ SOLN
12.5000 mg | Freq: Once | INTRAMUSCULAR | Status: AC
  Administered 2012-11-16: 12.5 mg via INTRAVENOUS
  Filled 2012-11-16: qty 1

## 2012-11-16 MED ORDER — HYDROMORPHONE HCL PF 1 MG/ML IJ SOLN
1.0000 mg | INTRAMUSCULAR | Status: DC | PRN
  Administered 2012-11-16 – 2012-11-18 (×12): 1 mg via INTRAVENOUS
  Filled 2012-11-16 (×14): qty 1

## 2012-11-16 MED ORDER — PANTOPRAZOLE SODIUM 40 MG IV SOLR
40.0000 mg | INTRAVENOUS | Status: AC
  Administered 2012-11-16: 40 mg via INTRAVENOUS
  Filled 2012-11-16: qty 40

## 2012-11-16 MED ORDER — ALBUTEROL SULFATE HFA 108 (90 BASE) MCG/ACT IN AERS: 2.0000 | INHALATION_SPRAY | Freq: Four times a day (QID) | RESPIRATORY_TRACT | Status: DC | PRN

## 2012-11-16 MED ORDER — SODIUM CHLORIDE 0.9 % IV SOLN
INTRAVENOUS | Status: DC
  Administered 2012-11-16: 05:00:00 via INTRAVENOUS

## 2012-11-16 MED ORDER — HYDROMORPHONE HCL PF 1 MG/ML IJ SOLN
1.0000 mg | INTRAMUSCULAR | Status: AC
  Administered 2012-11-16: 1 mg via INTRAVENOUS

## 2012-11-16 MED ORDER — PROMETHAZINE HCL 12.5 MG PO TABS
12.5000 mg | ORAL_TABLET | Freq: Four times a day (QID) | ORAL | Status: DC | PRN
  Administered 2012-11-16: 12.5 mg via ORAL
  Filled 2012-11-16: qty 1

## 2012-11-16 MED ORDER — DIPHENHYDRAMINE HCL 50 MG/ML IJ SOLN
25.0000 mg | Freq: Four times a day (QID) | INTRAMUSCULAR | Status: DC | PRN
  Administered 2012-11-16 – 2012-11-18 (×5): 25 mg via INTRAVENOUS
  Filled 2012-11-16 (×6): qty 1

## 2012-11-16 NOTE — Progress Notes (Signed)
Report given to [facility]. Patient transferred to room 334 in stable condition. Patient ambulated to new room.
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
## 2012-11-16 NOTE — ED Notes (Signed)
Patient c/o nausea, vomiting and upper abdominal pain; patient states feels like pancreatitis.

## 2012-11-16 NOTE — Progress Notes (Signed)
Patient admitted by Dr. Sharl Ma earlier this morning.  Patient seen and examined, database reviewed.  She has chronic pancreatitis/pancreas divisum.  She has been admitted with acute on chronic abdominal pain, felt to be due to pancreas.  She has had multiple admissions for the same, most recently being discharged a week ago.  She is followed by GI and primary care at [facility] and has a referral pending for a local pain specialist. Currently, she is sitting up on the side of the bed and appears to be uncomfortable due to abdominal pain.  She is tearful and says she is tired of constantly being in pain for the last 5 years. I think she would benefit from long acting pain medications, as well as short acting for breakthrough.  Start the patient on oxycontin 20mg  po bid and well as oxycodone for breakthrough. Continue NPO for now and advance to liquids as tolerated.  [name]
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
## 2012-11-16 NOTE — Progress Notes (Signed)
UR Chart Review Completed  

## 2012-11-16 NOTE — Care Management Note (Unsigned)
    Page 1 of 1   11/17/2012     12:09:36 PM   CARE MANAGEMENT NOTE 11/17/2012  Patient:  [name]   Account Number:  [id number]  Date Initiated:  11/16/2012  Documentation initiated by:  Sharrie Rothman  Subjective/Objective Assessment:   Pt admitted from home with pancreatitis. Pt lives with her husband and will return home at discharge. Pt does see GI specialist at [facility] and is being arranged with pain clinic in [place].     Action/Plan:   Pt is aware that CM will be unable to assist with pain medications at discharge. Will arrange pt with [facility] at discharge.   Anticipated DC Date:  11/18/2012   Anticipated DC Plan:  HOME/SELF CARE      DC Planning Services  CM consult      Choice offered to / List presented to:             Status of service:  In process, will continue to follow Medicare Important Message given?   (If response is "NO", the following Medicare IM given date fields will be blank) Date Medicare IM given:   Date Additional Medicare IM given:    Discharge Disposition:    Per UR Regulation:    If discussed at Long Length of Stay Meetings, dates discussed:    Comments:  11/17/12 1205 [name], RN BSN CM CM spoke with pt about PCP and pain management. Pt stated that she has a PCP, Lemma at [facility] and a GI MD she follows up with. Pt also stated that her PCP and GI doctor is trying to arrange pt a followup atppt with pain clinic in [place]. CM did recommend pt followup with Blima Singer at [facility] to inquire about medication assistance until [facility] application has been approved.  11/16/12 1515 [name], RN BSN CM

## 2012-11-17 MED ORDER — BOOST / RESOURCE BREEZE PO LIQD
1.0000 | Freq: Three times a day (TID) | ORAL | Status: DC
  Administered 2012-11-17 – 2012-11-18 (×3): 1 via ORAL

## 2012-11-17 MED ORDER — SODIUM CHLORIDE 0.9 % IJ SOLN
INTRAMUSCULAR | Status: AC
  Filled 2012-11-17: qty 3

## 2012-11-17 NOTE — Progress Notes (Signed)
[facility] PROGRESS NOTE  Ludie Hudon ZOX:[id number] DOB: 03/29/1986 DOA: 11/16/2012 PCP: No PCP Per Patient  Assessment/Plan: 1. Acute on chronic pancreatitis. Slow improvement.  Patient has chronic pain and will likely benefit from short and long acting pain meds. She is in the process of getting a referral to a pain specialist.  She is currently on oxycontin and oxycodone.  She wishes to try some liquids today.  Will advance as tolerated.  Code Status: full code Family Communication: discussed with patient Disposition Plan: discharge home once improved   Consultants:  none  Procedures:  none  Antibiotics:  none  HPI/Subjective: Feeling a little better today. No nausea or vomiting.  Abdominal pain still present but improving.  Objective: Filed Vitals:   11/16/12 0800 11/16/12 1200 11/16/12 2100 11/17/12 0609  BP: 161/84 135/81 119/69 108/68  Pulse: 85 90 83 82  Temp: 98.3 F (36.8 C) 98.1 F (36.7 C) 98.2 F (36.8 C) 97.9 F (36.6 C)  TempSrc: Oral Oral Oral Oral  Resp: 20 18 20 19   Height:      Weight:      SpO2: 100% 99% 100% 100%    Intake/Output Summary (Last 24 hours) at 11/17/12 1041 Last data filed at 11/17/12 0501  Gross per 24 hour  Intake 2747.08 ml  Output      0 ml  Net 2747.08 ml   Filed Weights   11/16/12 0317  Weight: 45.36 kg (100 lb)    Exam:   General:  NAD  Cardiovascular: s1, s2, rrr  Respiratory: CTA B  Abdomen: soft, tender in epigastrium, bs+  Musculoskeletal: no edema b/l   Data Reviewed: Basic Metabolic Panel:  Recent Labs Lab 11/11/12 0648 11/13/12 0220 11/16/12 0413 11/16/12 0514  NA 141 137 141  --   K 4.2 3.3* 3.4*  --   CL 102 97 108  --   CO2 28 30  --   --   GLUCOSE 97 119* 103*  --   BUN 15 15 14   --   CREATININE 0.57 0.58 0.60 0.49*  CALCIUM 9.7 10.0  --   --    Liver Function Tests:  Recent Labs Lab 11/11/12 0648 11/13/12 0220  AST 18 17  ALT 12 12  ALKPHOS 84 86  BILITOT 0.2*  0.2*  PROT 7.4 7.7  ALBUMIN 4.1 4.5    Recent Labs Lab 11/11/12 0648 11/13/12 0220 11/16/12 0320 11/16/12 0514  LIPASE 83* 66* 86* 73*   No results found for this basename: AMMONIA,  in the last 168 hours CBC:  Recent Labs Lab 11/11/12 0648 11/13/12 0220 11/16/12 0413 11/16/12 0514  WBC 7.0 8.4  --  9.9  NEUTROABS 2.7 5.1  --   --   HGB 13.4 13.8 13.3 13.6  HCT 40.0 40.7 39.0 40.1  MCV 91.1 90.2  --  90.7  PLT 304 325  --  344   Cardiac Enzymes: No results found for this basename: CKTOTAL, CKMB, CKMBINDEX, TROPONINI,  in the last 168 hours BNP (last 3 results) No results found for this basename: PROBNP,  in the last 8760 hours CBG: No results found for this basename: GLUCAP,  in the last 168 hours  Recent Results (from the past 240 hour(s))  MRSA PCR SCREENING     Status: None   Collection Time    11/16/12  4:57 AM      Result Value Range Status   MRSA by PCR NEGATIVE  NEGATIVE Final   Comment:  The GeneXpert MRSA Assay (FDA     approved for NASAL specimens     only), is one component of a     comprehensive MRSA colonization     surveillance program. It is not     intended to diagnose MRSA     infection nor to guide or     monitor treatment for     MRSA infections.     Studies: No results found.  Scheduled Meds: . enoxaparin (LOVENOX) injection  40 mg Subcutaneous Q24H  . OxyCODONE  20 mg Oral Q12H  . pantoprazole (PROTONIX) IV  40 mg Intravenous Q24H   Continuous Infusions: . 0.9 % NaCl with KCl 20 mEq / L 125 mL/hr at 11/17/12 0502    Active Problems:   Abdominal pain   Hypokalemia   Opiate dependence   Acute pancreatitis    Time spent: 20 mins    MEMON,JEHANZEB  [facility] Pager [phone]. If 7PM-7AM, please contact night-coverage at [email or web address], password [facility] 11/17/2012, 10:41 AM  LOS: 1 day

## 2012-11-17 NOTE — Progress Notes (Signed)
Pt had one episode of vomiting today after eating some lunch. States she tolerated broth ok, but jello made her feel nauseated. She was encouraged to go slowly with meals. [name]

## 2012-11-18 DIAGNOSIS — E440 Moderate protein-calorie malnutrition: Secondary | ICD-10-CM | POA: Insufficient documentation

## 2012-11-18 DIAGNOSIS — Q453 Other congenital malformations of pancreas and pancreatic duct: Secondary | ICD-10-CM

## 2012-11-18 MED ORDER — PANTOPRAZOLE SODIUM 40 MG PO TBEC: 40.0000 mg | DELAYED_RELEASE_TABLET | Freq: Every day | ORAL | Status: DC

## 2012-11-18 MED ORDER — OXYCODONE HCL 10 MG PO TABS: 10.0000 mg | ORAL_TABLET | Freq: Four times a day (QID) | ORAL | Status: DC | PRN

## 2012-11-18 MED ORDER — OXYCODONE HCL ER 20 MG PO T12A: 20.0000 mg | EXTENDED_RELEASE_TABLET | Freq: Two times a day (BID) | ORAL | Status: DC

## 2012-11-18 NOTE — Progress Notes (Signed)
Pt notified me and stated to me that she had ate her Full Liquid diet and stated that she thought that it was going to stay down and that she wanted to go home.  Notified Dr. Kerry Hough and he stated that he was going to discharge her.

## 2012-11-18 NOTE — Plan of Care (Signed)
Problem: Phase I Progression Outcomes Goal: Pain controlled with appropriate interventions Outcome: Progressing IV and po medication given.

## 2012-11-18 NOTE — Discharge Summary (Signed)
Physician Discharge Summary  [name] NWG:[id number] DOB: [birth year] DOA: 11/16/2012  PCP: No PCP Per Patient  Admit date: 11/16/2012 Discharge date: 11/18/2012  Time spent: 35 minutes  Recommendations for Outpatient Follow-up:  1. Follow up with primary care doctor at [facility] in the next week as previously scheduled and obtain referral for pain management specialist  Discharge Diagnoses:  Active Problems:   Abdominal pain   Hypokalemia   Opiate dependence   Acute pancreatitis   Malnutrition of moderate degree   Discharge Condition: improved  Diet recommendation: low fat  Filed Weights   11/16/12 0317  Weight: 45.36 kg (100 lb)    History of present illness:  27 year old female with a history of recurrent pancreatitis who was discharged from the hospital a week ago came back to the ED today with chief complaint of abdominal pain with intense take 8 bedtime radiating to the back also associated with vomiting and nausea. Patient has history of recurrent pancreatitis and is followed by gastroenterologist at [facility]. She also has chronic pain and is currently on opiates at home. She denies chest pain shortness of breath no fever no dysuria urgency or frequency of urination. In the ED patient was found to have mild elevation of lipase.   Hospital Course:  This patient was admitted to the hospital for acute on chronic pancreatitis. She was given IV fluids and kept NPO.  Diet was slowly advanced as was tolerated.  She appears to have chronic pain from her pancreatitis and had reported that she had done fairly well in the past on oxycontin for maintenance and oxycodone for breakthrough.  This was prescribed in the hospital and she has one well with this.  She is in the process of getting a referral to a pain management specialist by her primary care doctor.  Her IV pain meds were discontinued.  Today, she feels that she is improved.  She has tolerated a full liquid diet  and is anxious to discharge home.  She has been advised to slowly advance her diet to a low fat diet as tolerated. She will be discharged home later today.  Procedures:  none  Consultations:  none  Discharge Exam: Filed Vitals:   11/17/12 0609 11/17/12 1441 11/17/12 2100 11/18/12 0626  BP: 108/68  100/63 118/82  Pulse: 82 87 65 72  Temp: 97.9 F (36.6 C) 98.5 F (36.9 C) 97.7 F (36.5 C) 98.3 F (36.8 C)  TempSrc: Oral  Oral Oral  Resp: 19 16 20 20   Height:      Weight:      SpO2: 100% 100% 100% 100%    General: NAD Cardiovascular: S1, S2 RRR Respiratory: CTA B Abd: soft, nt, nd, bs+  Discharge Instructions  Discharge Orders   Future Orders Complete By Expires     Diet general  As directed     Comments:      Low fat, advance diet slowly    Increase activity slowly  As directed         Medication List    STOP taking these medications       HYDROcodone-acetaminophen 5-325 MG per tablet  Commonly known as:  NORCO/VICODIN      TAKE these medications       albuterol 108 (90 BASE) MCG/ACT inhaler  Commonly known as:  PROVENTIL HFA;VENTOLIN HFA  Inhale 2 puffs into the lungs every 6 (six) hours as needed for wheezing.     omeprazole 20 MG capsule  Commonly  known as:  PRILOSEC  Take 20 mg by mouth daily.     Oxycodone HCl 10 MG Tabs  Take 1 tablet (10 mg total) by mouth every 6 (six) hours as needed for pain.     OxyCODONE 20 mg T12a 12 hr tablet  Commonly known as:  OXYCONTIN  Take 1 tablet (20 mg total) by mouth every 12 (twelve) hours.     promethazine 25 MG tablet  Commonly known as:  PHENERGAN  Take 1 tablet (25 mg total) by mouth every 6 (six) hours as needed for nausea.     promethazine 25 MG suppository  Commonly known as:  PHENERGAN  Place 1 suppository (25 mg total) rectally every 6 (six) hours as needed for nausea.     zolpidem 5 MG tablet  Commonly known as:  AMBIEN  Take 5 mg by mouth at bedtime.       Allergies  Allergen  Reactions  . Bee Venom Anaphylaxis  . 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 Reglan (Metoclopramide) Other (See Comments)    Reaction:Severe anxiety       Follow-up Information   Follow up with follow up with primary care doctor at [facility] as scheduled and obtain referral for pain management specialist.       The results of significant diagnostics from this hospitalization (including imaging, microbiology, ancillary and laboratory) are listed below for reference.    Significant Diagnostic Studies: Dg Abd Acute W/chest  11/03/2012   *RADIOLOGY REPORT*  Clinical Data: Vomiting and abdominal pain.  ACUTE ABDOMEN SERIES (ABDOMEN 2 VIEW & CHEST 1 VIEW)  Comparison: Chest and two views abdomen 04/08/2012 and CT abdomen and pelvis 09/25/2012.  Findings: Single view of the chest demonstrates clear lungs and normal heart size.  No pneumothorax or pleural fluid.  Two views of the abdomen show a normal bowel gas pattern.  No free intraperitoneal air is identified.  No abnormal calcifications seen.  IMPRESSION: Negative examination.   Original Report Authenticated By: Holley Dexter, M.D.    Microbiology: Recent Results (from the past 240 hour(s))  MRSA PCR SCREENING     Status: None   Collection Time    11/16/12  4:57 AM      Result Value Range Status   MRSA by PCR NEGATIVE  NEGATIVE Final   Comment:            The GeneXpert MRSA Assay (FDA     approved for NASAL specimens     only), is one component of a     comprehensive MRSA colonization     surveillance program. It is not     intended to diagnose MRSA     infection nor to guide or     monitor treatment for     MRSA infections.     Labs: Basic Metabolic Panel:  Recent Labs Lab 11/13/12 0220 11/16/12 0413 11/16/12 0514  NA 137 141  --   K 3.3* 3.4*  --   CL 97 108  --   CO2 30  --   --   GLUCOSE 119* 103*  --   BUN 15 14  --   CREATININE 0.58 0.60 0.49*  CALCIUM 10.0  --   --    Liver Function  Tests:  Recent Labs Lab 11/13/12 0220  AST 17  ALT 12  ALKPHOS 86  BILITOT 0.2*  PROT 7.7  ALBUMIN 4.5    Recent Labs Lab 11/13/12 0220 11/16/12 0320 11/16/12  0514  LIPASE 66* 86* 73*   No results found for this basename: AMMONIA,  in the last 168 hours CBC:  Recent Labs Lab 11/13/12 0220 11/16/12 0413 11/16/12 0514  WBC 8.4  --  9.9  NEUTROABS 5.1  --   --   HGB 13.8 13.3 13.6  HCT 40.7 39.0 40.1  MCV 90.2  --  90.7  PLT 325  --  344   Cardiac Enzymes: No results found for this basename: CKTOTAL, CKMB, CKMBINDEX, TROPONINI,  in the last 168 hours BNP: BNP (last 3 results) No results found for this basename: PROBNP,  in the last 8760 hours CBG: No results found for this basename: GLUCAP,  in the last 168 hours     Signed:  MEMON,JEHANZEB  [facility] 11/18/2012, 1:17 PM

## 2012-11-18 NOTE — Progress Notes (Signed)
The patient is receiving Protonix by the intravenous route.  Based on criteria approved by the Pharmacy and Therapeutics Committee and the Medical Executive Committee, the medication is being converted to the equivalent oral dose form.  These criteria include: -No Active GI bleeding -Able to tolerate diet of full liquids (or better) or tube feeding OR able to tolerate other medications by the oral or enteral route  If you have any questions about this conversion, please contact the Pharmacy Department (ext [phone]).  Thank you.  Mady Gemma, [facility] 11/18/2012 8:54 AM

## 2012-12-01 ENCOUNTER — Encounter (HOSPITAL_COMMUNITY): Payer: Self-pay

## 2012-12-01 ENCOUNTER — Emergency Department (HOSPITAL_COMMUNITY)
Admission: EM | Admit: 2012-12-01 | Discharge: 2012-12-01 | Disposition: A | Discharge status: Home / Self Care | Payer: Self-pay | Attending: Emergency Medicine | Admitting: Emergency Medicine

## 2012-12-01 DIAGNOSIS — M546 Pain in thoracic spine: Secondary | ICD-10-CM | POA: Insufficient documentation

## 2012-12-01 DIAGNOSIS — Z8619 Personal history of other infectious and parasitic diseases: Secondary | ICD-10-CM | POA: Insufficient documentation

## 2012-12-01 DIAGNOSIS — R079 Chest pain, unspecified: Secondary | ICD-10-CM | POA: Insufficient documentation

## 2012-12-01 DIAGNOSIS — R0602 Shortness of breath: Secondary | ICD-10-CM | POA: Insufficient documentation

## 2012-12-01 DIAGNOSIS — F411 Generalized anxiety disorder: Secondary | ICD-10-CM | POA: Insufficient documentation

## 2012-12-01 DIAGNOSIS — R630 Anorexia: Secondary | ICD-10-CM | POA: Insufficient documentation

## 2012-12-01 DIAGNOSIS — Z8659 Personal history of other mental and behavioral disorders: Secondary | ICD-10-CM | POA: Insufficient documentation

## 2012-12-01 DIAGNOSIS — R110 Nausea: Secondary | ICD-10-CM | POA: Insufficient documentation

## 2012-12-01 DIAGNOSIS — I10 Essential (primary) hypertension: Secondary | ICD-10-CM | POA: Insufficient documentation

## 2012-12-01 DIAGNOSIS — Z79899 Other long term (current) drug therapy: Secondary | ICD-10-CM | POA: Insufficient documentation

## 2012-12-01 DIAGNOSIS — Z9889 Other specified postprocedural states: Secondary | ICD-10-CM | POA: Insufficient documentation

## 2012-12-01 DIAGNOSIS — F1210 Cannabis abuse, uncomplicated: Secondary | ICD-10-CM | POA: Insufficient documentation

## 2012-12-01 DIAGNOSIS — F17200 Nicotine dependence, unspecified, uncomplicated: Secondary | ICD-10-CM | POA: Insufficient documentation

## 2012-12-01 DIAGNOSIS — K219 Gastro-esophageal reflux disease without esophagitis: Secondary | ICD-10-CM | POA: Insufficient documentation

## 2012-12-01 DIAGNOSIS — R1013 Epigastric pain: Secondary | ICD-10-CM | POA: Insufficient documentation

## 2012-12-01 DIAGNOSIS — Z3202 Encounter for pregnancy test, result negative: Secondary | ICD-10-CM | POA: Insufficient documentation

## 2012-12-01 DIAGNOSIS — Z8739 Personal history of other diseases of the musculoskeletal system and connective tissue: Secondary | ICD-10-CM | POA: Insufficient documentation

## 2012-12-01 DIAGNOSIS — Z8719 Personal history of other diseases of the digestive system: Secondary | ICD-10-CM | POA: Insufficient documentation

## 2012-12-01 LAB — CBC WITH DIFFERENTIAL/PLATELET
Basophils Absolute: 0 10*3/uL (ref 0.0–0.1)
Eosinophils Relative: 6 % — ABNORMAL HIGH (ref 0–5)
Lymphocytes Relative: 45 % (ref 12–46)
Neutro Abs: 2 10*3/uL (ref 1.7–7.7)
Neutrophils Relative %: 36 % — ABNORMAL LOW (ref 43–77)
Platelets: 291 10*3/uL (ref 150–400)
RDW: 14.9 % (ref 11.5–15.5)
WBC: 5.7 10*3/uL (ref 4.0–10.5)

## 2012-12-01 LAB — COMPREHENSIVE METABOLIC PANEL
ALT: 12 U/L (ref 0–35)
AST: 19 U/L (ref 0–37)
CO2: 24 mEq/L (ref 19–32)
Calcium: 8.6 mg/dL (ref 8.4–10.5)
Chloride: 106 mEq/L (ref 96–112)
GFR calc non Af Amer: >90 mL/min (ref >90)
Potassium: 3.2 mEq/L — ABNORMAL LOW (ref 3.5–5.1)
Sodium: 138 mEq/L (ref 135–145)

## 2012-12-01 LAB — POCT PREGNANCY, URINE: Preg Test, Ur: NEGATIVE

## 2012-12-01 LAB — URINALYSIS, ROUTINE W REFLEX MICROSCOPIC
Glucose, UA: NEGATIVE mg/dL
Hgb urine dipstick: NEGATIVE
Protein, ur: NEGATIVE mg/dL

## 2012-12-01 MED ORDER — HYDROMORPHONE HCL PF 2 MG/ML IJ SOLN
1.0000 mg | Freq: Once | INTRAMUSCULAR | Status: AC
  Administered 2012-12-01: 1 mg via INTRAVENOUS
  Filled 2012-12-01: qty 1

## 2012-12-01 MED ORDER — ONDANSETRON HCL 4 MG/2ML IJ SOLN
INTRAMUSCULAR | Status: AC
  Administered 2012-12-01: 4 mg via INTRAVENOUS
  Filled 2012-12-01: qty 2

## 2012-12-01 MED ORDER — ONDANSETRON HCL 4 MG/2ML IJ SOLN: 4.0000 mg | Freq: Once | INTRAMUSCULAR | Status: AC

## 2012-12-01 MED ORDER — HYDROMORPHONE HCL PF 1 MG/ML IJ SOLN
1.0000 mg | Freq: Once | INTRAMUSCULAR | Status: AC
  Administered 2012-12-01: 1 mg via INTRAVENOUS
  Filled 2012-12-01: qty 1

## 2012-12-01 MED ORDER — ONDANSETRON HCL 4 MG/2ML IJ SOLN
4.0000 mg | Freq: Once | INTRAMUSCULAR | Status: AC
  Administered 2012-12-01: 4 mg via INTRAVENOUS
  Filled 2012-12-01: qty 2

## 2012-12-01 MED ORDER — SODIUM CHLORIDE 0.9 % IV SOLN
Freq: Once | INTRAVENOUS | Status: AC
  Administered 2012-12-01: 09:00:00 via INTRAVENOUS

## 2012-12-01 NOTE — ED Notes (Signed)
States that she is having abdominal and chest pain that started this morning.  States this pain feels like a flare up of her pancreatitis.

## 2012-12-01 NOTE — ED Notes (Signed)
Pt reports hx of pancreatitis.  Pt reports severe abd/chest pain that radiates to her mid-back.  Pt reports some nausea but no emesis.

## 2012-12-01 NOTE — ED Notes (Addendum)
States that her pain is now "shooting through to my back," PA made aware.
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
## 2012-12-03 NOTE — ED Provider Notes (Signed)
Medical screening examination/treatment/procedure(s) were performed by non-physician practitioner and as supervising physician I was immediately available for consultation/collaboration.   Shelda Jakes, MD 12/03/12 0800

## 2012-12-05 ENCOUNTER — Encounter (HOSPITAL_COMMUNITY): Payer: Self-pay | Admitting: *Deleted

## 2012-12-05 ENCOUNTER — Emergency Department (HOSPITAL_COMMUNITY)
Admission: EM | Admit: 2012-12-05 | Discharge: 2012-12-06 | Discharge status: Against Medical Advice | Payer: Self-pay | Attending: Emergency Medicine | Admitting: Emergency Medicine

## 2012-12-05 DIAGNOSIS — G8929 Other chronic pain: Secondary | ICD-10-CM | POA: Insufficient documentation

## 2012-12-05 DIAGNOSIS — IMO0002 Reserved for concepts with insufficient information to code with codable children: Secondary | ICD-10-CM | POA: Insufficient documentation

## 2012-12-05 DIAGNOSIS — F411 Generalized anxiety disorder: Secondary | ICD-10-CM | POA: Insufficient documentation

## 2012-12-05 DIAGNOSIS — R112 Nausea with vomiting, unspecified: Secondary | ICD-10-CM | POA: Insufficient documentation

## 2012-12-05 DIAGNOSIS — Z79899 Other long term (current) drug therapy: Secondary | ICD-10-CM | POA: Insufficient documentation

## 2012-12-05 DIAGNOSIS — R6883 Chills (without fever): Secondary | ICD-10-CM | POA: Insufficient documentation

## 2012-12-05 DIAGNOSIS — Z8619 Personal history of other infectious and parasitic diseases: Secondary | ICD-10-CM | POA: Insufficient documentation

## 2012-12-05 DIAGNOSIS — R1012 Left upper quadrant pain: Secondary | ICD-10-CM | POA: Insufficient documentation

## 2012-12-05 DIAGNOSIS — F17200 Nicotine dependence, unspecified, uncomplicated: Secondary | ICD-10-CM | POA: Insufficient documentation

## 2012-12-05 DIAGNOSIS — Z8719 Personal history of other diseases of the digestive system: Secondary | ICD-10-CM | POA: Insufficient documentation

## 2012-12-05 DIAGNOSIS — K219 Gastro-esophageal reflux disease without esophagitis: Secondary | ICD-10-CM | POA: Insufficient documentation

## 2012-12-05 DIAGNOSIS — I10 Essential (primary) hypertension: Secondary | ICD-10-CM | POA: Insufficient documentation

## 2012-12-05 DIAGNOSIS — Z8739 Personal history of other diseases of the musculoskeletal system and connective tissue: Secondary | ICD-10-CM | POA: Insufficient documentation

## 2012-12-05 DIAGNOSIS — Z8659 Personal history of other mental and behavioral disorders: Secondary | ICD-10-CM | POA: Insufficient documentation

## 2012-12-05 MED ORDER — ONDANSETRON HCL 4 MG/2ML IJ SOLN
INTRAMUSCULAR | Status: AC
  Administered 2012-12-05: 4 mg
  Filled 2012-12-05: qty 2

## 2012-12-05 MED ORDER — HYDROMORPHONE HCL PF 1 MG/ML IJ SOLN
1.0000 mg | Freq: Once | INTRAMUSCULAR | Status: AC
  Administered 2012-12-05: 1 mg via INTRAVENOUS

## 2012-12-05 MED ORDER — HYDROMORPHONE HCL PF 1 MG/ML IJ SOLN
INTRAMUSCULAR | Status: AC
  Filled 2012-12-05: qty 1

## 2012-12-05 NOTE — ED Notes (Signed)
abd pain, N/v

## 2012-12-06 MED ORDER — LORAZEPAM 2 MG/ML IJ SOLN
1.0000 mg | Freq: Once | INTRAMUSCULAR | Status: AC
  Administered 2012-12-06: 1 mg via INTRAVENOUS
  Filled 2012-12-06: qty 1

## 2012-12-06 MED ORDER — SODIUM CHLORIDE 0.9 % IV SOLN: 1000.0000 mL | INTRAVENOUS | Status: DC

## 2012-12-06 MED ORDER — SODIUM CHLORIDE 0.9 % IV SOLN: 1000.0000 mL | Freq: Once | INTRAVENOUS | Status: DC

## 2012-12-06 MED ORDER — PROMETHAZINE HCL 25 MG/ML IJ SOLN
25.0000 mg | Freq: Once | INTRAMUSCULAR | Status: AC
  Administered 2012-12-06: 25 mg via INTRAMUSCULAR
  Filled 2012-12-06: qty 1
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
## 2012-12-06 NOTE — ED Notes (Signed)
Spoke to pt regarding why she wants to leave [facility].  Pt reports "that doctor was rude to me and didn't listen to me when I told her about my severe pain"

## 2012-12-10 ENCOUNTER — Emergency Department (HOSPITAL_COMMUNITY)
Admission: EM | Admit: 2012-12-10 | Discharge: 2012-12-10 | Disposition: A | Discharge status: Home / Self Care | Payer: Self-pay

## 2012-12-12 ENCOUNTER — Emergency Department (HOSPITAL_COMMUNITY)
Admission: EM | Admit: 2012-12-12 | Discharge: 2012-12-12 | Disposition: A | Discharge status: Home / Self Care | Payer: Self-pay | Attending: Emergency Medicine | Admitting: Emergency Medicine

## 2012-12-12 ENCOUNTER — Encounter (HOSPITAL_COMMUNITY): Payer: Self-pay | Admitting: *Deleted

## 2012-12-12 DIAGNOSIS — K219 Gastro-esophageal reflux disease without esophagitis: Secondary | ICD-10-CM | POA: Insufficient documentation

## 2012-12-12 DIAGNOSIS — Z79899 Other long term (current) drug therapy: Secondary | ICD-10-CM | POA: Insufficient documentation

## 2012-12-12 DIAGNOSIS — R112 Nausea with vomiting, unspecified: Secondary | ICD-10-CM | POA: Insufficient documentation

## 2012-12-12 DIAGNOSIS — Z8659 Personal history of other mental and behavioral disorders: Secondary | ICD-10-CM | POA: Insufficient documentation

## 2012-12-12 DIAGNOSIS — G8929 Other chronic pain: Secondary | ICD-10-CM | POA: Insufficient documentation

## 2012-12-12 DIAGNOSIS — I10 Essential (primary) hypertension: Secondary | ICD-10-CM | POA: Insufficient documentation

## 2012-12-12 DIAGNOSIS — Z8619 Personal history of other infectious and parasitic diseases: Secondary | ICD-10-CM | POA: Insufficient documentation

## 2012-12-12 DIAGNOSIS — R1084 Generalized abdominal pain: Secondary | ICD-10-CM | POA: Insufficient documentation

## 2012-12-12 DIAGNOSIS — Z8719 Personal history of other diseases of the digestive system: Secondary | ICD-10-CM | POA: Insufficient documentation

## 2012-12-12 DIAGNOSIS — Z8739 Personal history of other diseases of the musculoskeletal system and connective tissue: Secondary | ICD-10-CM | POA: Insufficient documentation

## 2012-12-12 DIAGNOSIS — R109 Unspecified abdominal pain: Secondary | ICD-10-CM

## 2012-12-12 DIAGNOSIS — F17200 Nicotine dependence, unspecified, uncomplicated: Secondary | ICD-10-CM | POA: Insufficient documentation

## 2012-12-12 LAB — CBC
MCH: 30.8 pg (ref 26.0–34.0)
MCHC: 34.3 g/dL (ref 30.0–36.0)
Platelets: 294 10*3/uL (ref 150–400)
RDW: 14.8 % (ref 11.5–15.5)

## 2012-12-12 LAB — LIPASE, BLOOD: Lipase: 49 U/L (ref 11–59)

## 2012-12-12 LAB — HEPATIC FUNCTION PANEL
Alkaline Phosphatase: 77 U/L (ref 39–117)
Total Protein: 7.6 g/dL (ref 6.0–8.3)

## 2012-12-12 LAB — BASIC METABOLIC PANEL
Calcium: 10 mg/dL (ref 8.4–10.5)
GFR calc Af Amer: >90 mL/min (ref >90)
GFR calc non Af Amer: >90 mL/min (ref >90)
Sodium: 138 mEq/L (ref 135–145)

## 2012-12-12 MED ORDER — ONDANSETRON HCL 4 MG/2ML IJ SOLN
4.0000 mg | Freq: Once | INTRAMUSCULAR | Status: AC
  Administered 2012-12-12: 4 mg via INTRAVENOUS
  Filled 2012-12-12: qty 2

## 2012-12-12 MED ORDER — SODIUM CHLORIDE 0.9 % IV SOLN
1000.0000 mL | Freq: Once | INTRAVENOUS | Status: AC
  Administered 2012-12-12: 1000 mL via INTRAVENOUS
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
## 2012-12-12 NOTE — ED Notes (Signed)
Pt with abd pain with N/V since 4-5 this evening, pt states has hx of acute pancreatitis, states unable to keep anything down, denies diarrhea

## 2012-12-12 NOTE — ED Notes (Signed)
Discharge instructions reviewed with pt, questions answered. Pt verbalized understanding.  

## 2012-12-21 ENCOUNTER — Encounter (HOSPITAL_COMMUNITY): Payer: Self-pay | Admitting: *Deleted

## 2012-12-21 ENCOUNTER — Emergency Department (HOSPITAL_COMMUNITY)
Admission: EM | Admit: 2012-12-21 | Discharge: 2012-12-21 | Disposition: A | Discharge status: Home / Self Care | Payer: Self-pay | Attending: Emergency Medicine | Admitting: Emergency Medicine

## 2012-12-21 DIAGNOSIS — Z8739 Personal history of other diseases of the musculoskeletal system and connective tissue: Secondary | ICD-10-CM | POA: Insufficient documentation

## 2012-12-21 DIAGNOSIS — I10 Essential (primary) hypertension: Secondary | ICD-10-CM | POA: Insufficient documentation

## 2012-12-21 DIAGNOSIS — Z8659 Personal history of other mental and behavioral disorders: Secondary | ICD-10-CM | POA: Insufficient documentation

## 2012-12-21 DIAGNOSIS — F17200 Nicotine dependence, unspecified, uncomplicated: Secondary | ICD-10-CM | POA: Insufficient documentation

## 2012-12-21 DIAGNOSIS — Z8619 Personal history of other infectious and parasitic diseases: Secondary | ICD-10-CM | POA: Insufficient documentation

## 2012-12-21 DIAGNOSIS — R112 Nausea with vomiting, unspecified: Secondary | ICD-10-CM | POA: Insufficient documentation

## 2012-12-21 DIAGNOSIS — R109 Unspecified abdominal pain: Secondary | ICD-10-CM | POA: Insufficient documentation

## 2012-12-21 DIAGNOSIS — K859 Acute pancreatitis without necrosis or infection, unspecified: Secondary | ICD-10-CM | POA: Insufficient documentation

## 2012-12-21 DIAGNOSIS — K219 Gastro-esophageal reflux disease without esophagitis: Secondary | ICD-10-CM | POA: Insufficient documentation

## 2012-12-21 DIAGNOSIS — Z79899 Other long term (current) drug therapy: Secondary | ICD-10-CM | POA: Insufficient documentation

## 2012-12-21 DIAGNOSIS — K2970 Gastritis, unspecified, without bleeding: Secondary | ICD-10-CM | POA: Insufficient documentation

## 2012-12-21 DIAGNOSIS — Z9889 Other specified postprocedural states: Secondary | ICD-10-CM | POA: Insufficient documentation

## 2012-12-21 DIAGNOSIS — G8929 Other chronic pain: Secondary | ICD-10-CM | POA: Insufficient documentation

## 2012-12-21 HISTORY — DX: Gastritis, unspecified, without bleeding: K29.70

## 2012-12-21 LAB — COMPREHENSIVE METABOLIC PANEL
ALT: 17 U/L (ref 0–35)
AST: 18 U/L (ref 0–37)
Alkaline Phosphatase: 72 U/L (ref 39–117)
CO2: 28 mEq/L (ref 19–32)
Chloride: 104 mEq/L (ref 96–112)
Creatinine, Ser: 0.56 mg/dL (ref 0.50–1.10)
GFR calc non Af Amer: >90 mL/min (ref >90)
Total Bilirubin: <0.1 mg/dL — ABNORMAL LOW (ref 0.3–1.2)

## 2012-12-21 LAB — CBC WITH DIFFERENTIAL/PLATELET
Basophils Absolute: 0 10*3/uL (ref 0.0–0.1)
HCT: 36.8 % (ref 36.0–46.0)
Hemoglobin: 12.7 g/dL (ref 12.0–15.0)
Lymphocytes Relative: 31 % (ref 12–46)
Monocytes Absolute: 1 10*3/uL (ref 0.1–1.0)
Neutro Abs: 5.4 10*3/uL (ref 1.7–7.7)
RDW: 14.5 % (ref 11.5–15.5)
WBC: 9.6 10*3/uL (ref 4.0–10.5)

## 2012-12-21 MED ORDER — KETOROLAC TROMETHAMINE 60 MG/2ML IM SOLN
60.0000 mg | Freq: Once | INTRAMUSCULAR | Status: AC
  Administered 2012-12-21: 60 mg via INTRAMUSCULAR
  Filled 2012-12-21: qty 2

## 2012-12-21 MED ORDER — PROMETHAZINE HCL 25 MG/ML IJ SOLN
25.0000 mg | Freq: Once | INTRAMUSCULAR | Status: AC
  Administered 2012-12-21: 25 mg via INTRAMUSCULAR
  Filled 2012-12-21: qty 1

## 2012-12-21 NOTE — ED Notes (Signed)
abd pain, NV, no diarrhea.
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
## 2012-12-21 NOTE — ED Notes (Signed)
MD at bedside. 

## 2013-01-17 ENCOUNTER — Encounter (HOSPITAL_COMMUNITY): Payer: Self-pay

## 2013-01-17 ENCOUNTER — Emergency Department (HOSPITAL_COMMUNITY)
Admission: EM | Admit: 2013-01-17 | Discharge: 2013-01-17 | Disposition: A | Discharge status: Home / Self Care | Payer: Self-pay | Attending: Emergency Medicine | Admitting: Emergency Medicine

## 2013-01-17 DIAGNOSIS — F17200 Nicotine dependence, unspecified, uncomplicated: Secondary | ICD-10-CM | POA: Insufficient documentation

## 2013-01-17 DIAGNOSIS — Z79899 Other long term (current) drug therapy: Secondary | ICD-10-CM | POA: Insufficient documentation

## 2013-01-17 DIAGNOSIS — Z3202 Encounter for pregnancy test, result negative: Secondary | ICD-10-CM | POA: Insufficient documentation

## 2013-01-17 DIAGNOSIS — Z8739 Personal history of other diseases of the musculoskeletal system and connective tissue: Secondary | ICD-10-CM | POA: Insufficient documentation

## 2013-01-17 DIAGNOSIS — Z8619 Personal history of other infectious and parasitic diseases: Secondary | ICD-10-CM | POA: Insufficient documentation

## 2013-01-17 DIAGNOSIS — R112 Nausea with vomiting, unspecified: Secondary | ICD-10-CM | POA: Insufficient documentation

## 2013-01-17 DIAGNOSIS — Z8659 Personal history of other mental and behavioral disorders: Secondary | ICD-10-CM | POA: Insufficient documentation

## 2013-01-17 DIAGNOSIS — K219 Gastro-esophageal reflux disease without esophagitis: Secondary | ICD-10-CM | POA: Insufficient documentation

## 2013-01-17 DIAGNOSIS — I10 Essential (primary) hypertension: Secondary | ICD-10-CM | POA: Insufficient documentation

## 2013-01-17 DIAGNOSIS — R300 Dysuria: Secondary | ICD-10-CM | POA: Insufficient documentation

## 2013-01-17 LAB — URINALYSIS, ROUTINE W REFLEX MICROSCOPIC
Bilirubin Urine: NEGATIVE
Glucose, UA: NEGATIVE mg/dL
Hgb urine dipstick: NEGATIVE
Ketones, ur: NEGATIVE mg/dL
Protein, ur: NEGATIVE mg/dL

## 2013-01-17 MED ORDER — ONDANSETRON HCL 4 MG/2ML IJ SOLN
4.0000 mg | Freq: Once | INTRAMUSCULAR | Status: DC
  Filled 2013-01-17: qty 2

## 2013-01-17 MED ORDER — ONDANSETRON 8 MG PO TBDP
8.0000 mg | ORAL_TABLET | Freq: Once | ORAL | Status: AC
  Administered 2013-01-17: 8 mg via ORAL

## 2013-01-17 MED ORDER — SODIUM CHLORIDE 0.9 % IV BOLUS (SEPSIS): 1000.0000 mL | Freq: Once | INTRAVENOUS | Status: DC

## 2013-01-17 MED ORDER — PHENAZOPYRIDINE HCL 200 MG PO TABS: ORAL_TABLET | ORAL | Status: DC

## 2013-01-17 MED ORDER — PHENAZOPYRIDINE HCL 100 MG PO TABS
200.0000 mg | ORAL_TABLET | Freq: Once | ORAL | Status: AC
  Administered 2013-01-17: 200 mg via ORAL
  Filled 2013-01-17: qty 2

## 2013-01-17 MED ORDER — DOXYCYCLINE HYCLATE 100 MG PO CAPS: 100.0000 mg | ORAL_CAPSULE | Freq: Two times a day (BID) | ORAL | Status: DC

## 2013-01-17 MED ORDER — ONDANSETRON 8 MG PO TBDP
ORAL_TABLET | ORAL | Status: AC
  Filled 2013-01-17: qty 1

## 2013-01-17 NOTE — ED Notes (Signed)
Pt reports her typical pancreatitis pain but this time is also having some  Burning with urination and frequency which she has never had previously.

## 2013-01-17 NOTE — ED Notes (Signed)
Sipping sprite for fluid challenge

## 2013-01-18 LAB — URINE CULTURE: Colony Count: NO GROWTH
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
## 2013-02-22 ENCOUNTER — Other Ambulatory Visit: Payer: Self-pay

## 2013-03-31 ENCOUNTER — Encounter (HOSPITAL_COMMUNITY): Payer: Self-pay | Admitting: Emergency Medicine

## 2013-03-31 ENCOUNTER — Emergency Department (HOSPITAL_COMMUNITY)
Admission: EM | Admit: 2013-03-31 | Discharge: 2013-03-31 | Disposition: A | Discharge status: Home / Self Care | Payer: Self-pay | Attending: Emergency Medicine | Admitting: Emergency Medicine

## 2013-03-31 DIAGNOSIS — F17200 Nicotine dependence, unspecified, uncomplicated: Secondary | ICD-10-CM | POA: Insufficient documentation

## 2013-03-31 DIAGNOSIS — K219 Gastro-esophageal reflux disease without esophagitis: Secondary | ICD-10-CM | POA: Insufficient documentation

## 2013-03-31 DIAGNOSIS — K859 Acute pancreatitis without necrosis or infection, unspecified: Secondary | ICD-10-CM

## 2013-03-31 DIAGNOSIS — I10 Essential (primary) hypertension: Secondary | ICD-10-CM | POA: Insufficient documentation

## 2013-03-31 DIAGNOSIS — Z79899 Other long term (current) drug therapy: Secondary | ICD-10-CM | POA: Insufficient documentation

## 2013-03-31 DIAGNOSIS — Z8739 Personal history of other diseases of the musculoskeletal system and connective tissue: Secondary | ICD-10-CM | POA: Insufficient documentation

## 2013-03-31 DIAGNOSIS — G8929 Other chronic pain: Secondary | ICD-10-CM | POA: Insufficient documentation

## 2013-03-31 DIAGNOSIS — Z8659 Personal history of other mental and behavioral disorders: Secondary | ICD-10-CM | POA: Insufficient documentation

## 2013-03-31 DIAGNOSIS — Z8619 Personal history of other infectious and parasitic diseases: Secondary | ICD-10-CM | POA: Insufficient documentation

## 2013-03-31 LAB — CBC WITH DIFFERENTIAL/PLATELET
Basophils Absolute: 0 10*3/uL (ref 0.0–0.1)
Basophils Relative: 0 % (ref 0–1)
Eosinophils Absolute: 0.2 10*3/uL (ref 0.0–0.7)
Eosinophils Relative: 2 % (ref 0–5)
HCT: 36.3 % (ref 36.0–46.0)
Hemoglobin: 12 g/dL (ref 12.0–15.0)
Lymphs Abs: 2.3 10*3/uL (ref 0.7–4.0)
MCH: 29.8 pg (ref 26.0–34.0)
MCHC: 33.1 g/dL (ref 30.0–36.0)
MCV: 90.1 fL (ref 78.0–100.0)
Monocytes Absolute: 0.7 10*3/uL (ref 0.1–1.0)
Monocytes Relative: 8 % (ref 3–12)
Neutrophils Relative %: 65 % (ref 43–77)

## 2013-03-31 LAB — LIPASE, BLOOD: Lipase: 643 U/L — ABNORMAL HIGH (ref 11–59)

## 2013-03-31 LAB — COMPREHENSIVE METABOLIC PANEL
Albumin: 4.2 g/dL (ref 3.5–5.2)
Alkaline Phosphatase: 70 U/L (ref 39–117)
BUN: 14 mg/dL (ref 6–23)
Creatinine, Ser: 0.67 mg/dL (ref 0.50–1.10)
GFR calc Af Amer: >90 mL/min (ref >90)
Glucose, Bld: 129 mg/dL — ABNORMAL HIGH (ref 70–99)
Potassium: 4.1 mEq/L (ref 3.5–5.1)
Total Protein: 7.4 g/dL (ref 6.0–8.3)

## 2013-03-31 MED ORDER — ONDANSETRON HCL 4 MG/2ML IJ SOLN
4.0000 mg | Freq: Once | INTRAMUSCULAR | Status: AC
  Administered 2013-03-31: 4 mg via INTRAMUSCULAR
  Filled 2013-03-31: qty 2

## 2013-03-31 MED ORDER — OXYCODONE HCL 5 MG PO TABS: 5.0000 mg | ORAL_TABLET | Freq: Four times a day (QID) | ORAL | Status: DC | PRN

## 2013-03-31 MED ORDER — HYDROMORPHONE HCL PF 1 MG/ML IJ SOLN
1.0000 mg | Freq: Once | INTRAMUSCULAR | Status: AC
  Administered 2013-03-31: 1 mg via INTRAVENOUS
  Filled 2013-03-31: qty 1

## 2013-03-31 MED ORDER — ONDANSETRON HCL 4 MG PO TABS: 4.0000 mg | ORAL_TABLET | Freq: Three times a day (TID) | ORAL | Status: DC | PRN

## 2013-03-31 MED ORDER — SODIUM CHLORIDE 0.9 % IV BOLUS (SEPSIS)
1000.0000 mL | Freq: Once | INTRAVENOUS | Status: AC
  Administered 2013-03-31: 1000 mL via INTRAVENOUS
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
## 2013-03-31 NOTE — ED Notes (Signed)
Pt reports abdominal pain that started today around 1100. Pt reports nausea & a little vomiting today.

## 2013-04-03 ENCOUNTER — Encounter (HOSPITAL_COMMUNITY): Payer: Self-pay | Admitting: Emergency Medicine

## 2013-04-03 ENCOUNTER — Emergency Department (HOSPITAL_COMMUNITY)
Admission: EM | Admit: 2013-04-03 | Discharge: 2013-04-03 | Discharge status: Against Medical Advice | Payer: Self-pay | Attending: Emergency Medicine | Admitting: Emergency Medicine

## 2013-04-03 ENCOUNTER — Encounter: Payer: Self-pay | Admitting: Gastroenterology

## 2013-04-03 DIAGNOSIS — I10 Essential (primary) hypertension: Secondary | ICD-10-CM | POA: Insufficient documentation

## 2013-04-03 DIAGNOSIS — G8929 Other chronic pain: Secondary | ICD-10-CM | POA: Insufficient documentation

## 2013-04-03 DIAGNOSIS — R112 Nausea with vomiting, unspecified: Secondary | ICD-10-CM | POA: Insufficient documentation

## 2013-04-03 DIAGNOSIS — K859 Acute pancreatitis without necrosis or infection, unspecified: Secondary | ICD-10-CM | POA: Insufficient documentation

## 2013-04-03 DIAGNOSIS — R109 Unspecified abdominal pain: Secondary | ICD-10-CM | POA: Insufficient documentation

## 2013-04-03 DIAGNOSIS — F17200 Nicotine dependence, unspecified, uncomplicated: Secondary | ICD-10-CM | POA: Insufficient documentation

## 2013-04-03 NOTE — ED Notes (Signed)
At 0046, patient informed registration that she was leaving.

## 2013-04-03 NOTE — ED Notes (Signed)
Pt c/o n/v/upper abd pain radiating into back since Friday. Pt seen in ED Saturday for same.

## 2013-04-03 NOTE — ED Notes (Signed)
Patient states was seen on Saturday and was given the option of being admitted or going home with medication.  Patient states she has been hurting since then.

## 2013-04-03 NOTE — ED Notes (Signed)
Patient informed registration that she was leaving.  

## 2013-04-19 HISTORY — PX: EUS: SHX5427

## 2013-04-22 ENCOUNTER — Encounter (HOSPITAL_COMMUNITY): Payer: Self-pay | Admitting: Emergency Medicine

## 2013-04-22 ENCOUNTER — Emergency Department (HOSPITAL_COMMUNITY)
Admission: EM | Admit: 2013-04-22 | Discharge: 2013-04-22 | Disposition: A | Discharge status: Home / Self Care | Payer: Self-pay | Attending: Emergency Medicine | Admitting: Emergency Medicine

## 2013-04-22 DIAGNOSIS — K219 Gastro-esophageal reflux disease without esophagitis: Secondary | ICD-10-CM | POA: Insufficient documentation

## 2013-04-22 DIAGNOSIS — Z8619 Personal history of other infectious and parasitic diseases: Secondary | ICD-10-CM | POA: Insufficient documentation

## 2013-04-22 DIAGNOSIS — Z3202 Encounter for pregnancy test, result negative: Secondary | ICD-10-CM | POA: Insufficient documentation

## 2013-04-22 DIAGNOSIS — R112 Nausea with vomiting, unspecified: Secondary | ICD-10-CM | POA: Insufficient documentation

## 2013-04-22 DIAGNOSIS — G8929 Other chronic pain: Secondary | ICD-10-CM | POA: Insufficient documentation

## 2013-04-22 DIAGNOSIS — Z8739 Personal history of other diseases of the musculoskeletal system and connective tissue: Secondary | ICD-10-CM | POA: Insufficient documentation

## 2013-04-22 DIAGNOSIS — R109 Unspecified abdominal pain: Secondary | ICD-10-CM | POA: Insufficient documentation

## 2013-04-22 DIAGNOSIS — Z9889 Other specified postprocedural states: Secondary | ICD-10-CM | POA: Insufficient documentation

## 2013-04-22 DIAGNOSIS — Z8659 Personal history of other mental and behavioral disorders: Secondary | ICD-10-CM | POA: Insufficient documentation

## 2013-04-22 DIAGNOSIS — F17200 Nicotine dependence, unspecified, uncomplicated: Secondary | ICD-10-CM | POA: Insufficient documentation

## 2013-04-22 DIAGNOSIS — R52 Pain, unspecified: Secondary | ICD-10-CM | POA: Insufficient documentation

## 2013-04-22 DIAGNOSIS — Z79899 Other long term (current) drug therapy: Secondary | ICD-10-CM | POA: Insufficient documentation

## 2013-04-22 DIAGNOSIS — I10 Essential (primary) hypertension: Secondary | ICD-10-CM | POA: Insufficient documentation

## 2013-04-22 LAB — COMPREHENSIVE METABOLIC PANEL
ALT: 12 U/L (ref 0–35)
AST: 17 U/L (ref 0–37)
Albumin: 4.1 g/dL (ref 3.5–5.2)
Alkaline Phosphatase: 76 U/L (ref 39–117)
BUN: 12 mg/dL (ref 6–23)
CALCIUM: 9.7 mg/dL (ref 8.4–10.5)
CO2: 27 meq/L (ref 19–32)
CREATININE: 0.55 mg/dL (ref 0.50–1.10)
Chloride: 102 mEq/L (ref 96–112)
GFR calc Af Amer: >90 mL/min (ref >90)
Glucose, Bld: 170 mg/dL — ABNORMAL HIGH (ref 70–99)
Potassium: 4.2 mEq/L (ref 3.7–5.3)
Sodium: 140 mEq/L (ref 137–147)
Total Bilirubin: 0.2 mg/dL — ABNORMAL LOW (ref 0.3–1.2)
Total Protein: 7.8 g/dL (ref 6.0–8.3)

## 2013-04-22 LAB — CBC
HCT: 37.8 % (ref 36.0–46.0)
Hemoglobin: 12.8 g/dL (ref 12.0–15.0)
MCH: 29.4 pg (ref 26.0–34.0)
MCHC: 33.9 g/dL (ref 30.0–36.0)
MCV: 86.9 fL (ref 78.0–100.0)
PLATELETS: 344 10*3/uL (ref 150–400)
RBC: 4.35 MIL/uL (ref 3.87–5.11)
RDW: 14.9 % (ref 11.5–15.5)
WBC: 8.1 10*3/uL (ref 4.0–10.5)

## 2013-04-22 LAB — URINALYSIS, ROUTINE W REFLEX MICROSCOPIC
Bilirubin Urine: NEGATIVE
Glucose, UA: 250 mg/dL — AB
Hgb urine dipstick: NEGATIVE
Ketones, ur: NEGATIVE mg/dL
LEUKOCYTES UA: NEGATIVE
NITRITE: NEGATIVE
PROTEIN: NEGATIVE mg/dL
Specific Gravity, Urine: 1.025 (ref 1.005–1.030)
UROBILINOGEN UA: 0.2 mg/dL (ref 0.0–1.0)
pH: 7.5 (ref 5.0–8.0)

## 2013-04-22 LAB — POCT PREGNANCY, URINE: PREG TEST UR: NEGATIVE

## 2013-04-22 LAB — LIPASE, BLOOD: Lipase: 77 U/L — ABNORMAL HIGH (ref 11–59)

## 2013-04-22 MED ORDER — KETAMINE HCL 10 MG/ML IJ SOLN
10.0000 mg | Freq: Once | INTRAMUSCULAR | Status: AC
  Administered 2013-04-22: 10 mg via INTRAVENOUS
  Filled 2013-04-22: qty 1

## 2013-04-22 MED ORDER — SODIUM CHLORIDE 0.9 % IV BOLUS (SEPSIS)
1000.0000 mL | Freq: Once | INTRAVENOUS | Status: AC
  Administered 2013-04-22: 1000 mL via INTRAVENOUS
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
## 2013-04-22 NOTE — ED Notes (Signed)
Family at bedside. Patient asking if EDP has put in order for pain medication. Patient on phone while taking vitals, now states that she is nauseated and that is what she really needs right now.

## 2013-04-22 NOTE — Discharge Instructions (Signed)

## 2013-04-22 NOTE — ED Notes (Signed)
Pt states abdominal pain and vomiting began at 1000. Hx of pancreatitis.

## 2013-05-04 ENCOUNTER — Encounter (HOSPITAL_COMMUNITY): Payer: Self-pay | Admitting: Emergency Medicine

## 2013-05-04 DIAGNOSIS — Z8619 Personal history of other infectious and parasitic diseases: Secondary | ICD-10-CM | POA: Insufficient documentation

## 2013-05-04 DIAGNOSIS — Z8659 Personal history of other mental and behavioral disorders: Secondary | ICD-10-CM | POA: Insufficient documentation

## 2013-05-04 DIAGNOSIS — Z79899 Other long term (current) drug therapy: Secondary | ICD-10-CM | POA: Insufficient documentation

## 2013-05-04 DIAGNOSIS — K219 Gastro-esophageal reflux disease without esophagitis: Secondary | ICD-10-CM | POA: Insufficient documentation

## 2013-05-04 DIAGNOSIS — Z8739 Personal history of other diseases of the musculoskeletal system and connective tissue: Secondary | ICD-10-CM | POA: Insufficient documentation

## 2013-05-04 DIAGNOSIS — I10 Essential (primary) hypertension: Secondary | ICD-10-CM | POA: Insufficient documentation

## 2013-05-04 DIAGNOSIS — K861 Other chronic pancreatitis: Secondary | ICD-10-CM | POA: Insufficient documentation

## 2013-05-04 DIAGNOSIS — G8929 Other chronic pain: Secondary | ICD-10-CM | POA: Insufficient documentation

## 2013-05-04 DIAGNOSIS — F17200 Nicotine dependence, unspecified, uncomplicated: Secondary | ICD-10-CM | POA: Insufficient documentation

## 2013-05-04 NOTE — ED Notes (Signed)
Pt c/o abdominal pain and n/v.

## 2013-05-05 ENCOUNTER — Emergency Department (HOSPITAL_COMMUNITY)
Admission: EM | Admit: 2013-05-05 | Discharge: 2013-05-05 | Disposition: A | Discharge status: Home / Self Care | Payer: Self-pay | Attending: Emergency Medicine | Admitting: Emergency Medicine

## 2013-05-05 DIAGNOSIS — K859 Acute pancreatitis without necrosis or infection, unspecified: Secondary | ICD-10-CM

## 2013-05-05 LAB — COMPREHENSIVE METABOLIC PANEL
ALT: 13 U/L (ref 0–35)
AST: 18 U/L (ref 0–37)
Albumin: 4.9 g/dL (ref 3.5–5.2)
Alkaline Phosphatase: 98 U/L (ref 39–117)
BUN: 10 mg/dL (ref 6–23)
CO2: 25 mEq/L (ref 19–32)
Calcium: 10.3 mg/dL (ref 8.4–10.5)
Chloride: 100 mEq/L (ref 96–112)
Creatinine, Ser: 0.57 mg/dL (ref 0.50–1.10)
GFR calc Af Amer: >90 mL/min (ref >90)
GFR calc non Af Amer: >90 mL/min (ref >90)
Glucose, Bld: 104 mg/dL — ABNORMAL HIGH (ref 70–99)
Potassium: 3.5 mEq/L — ABNORMAL LOW (ref 3.7–5.3)
SODIUM: 142 meq/L (ref 137–147)
Total Bilirubin: 0.2 mg/dL — ABNORMAL LOW (ref 0.3–1.2)
Total Protein: 8.7 g/dL — ABNORMAL HIGH (ref 6.0–8.3)

## 2013-05-05 LAB — CBC
HCT: 37.6 % (ref 36.0–46.0)
Hemoglobin: 13.4 g/dL (ref 12.0–15.0)
MCH: 30.7 pg (ref 26.0–34.0)
MCHC: 35.6 g/dL (ref 30.0–36.0)
MCV: 86 fL (ref 78.0–100.0)
PLATELETS: 385 10*3/uL (ref 150–400)
RBC: 4.37 MIL/uL (ref 3.87–5.11)
RDW: 15.9 % — ABNORMAL HIGH (ref 11.5–15.5)
WBC: 15.1 10*3/uL — ABNORMAL HIGH (ref 4.0–10.5)

## 2013-05-05 LAB — LIPASE, BLOOD: LIPASE: 198 U/L — AB (ref 11–59)

## 2013-05-05 MED ORDER — ONDANSETRON HCL 4 MG/2ML IJ SOLN
INTRAMUSCULAR | Status: AC
  Filled 2013-05-05: qty 2

## 2013-05-05 MED ORDER — KETAMINE HCL 10 MG/ML IJ SOLN
5.0000 mg | Freq: Once | INTRAMUSCULAR | Status: AC
  Administered 2013-05-05: 02:00:00 via INTRAVENOUS

## 2013-05-05 MED ORDER — ONDANSETRON HCL 4 MG/2ML IJ SOLN: 4.0000 mg | Freq: Once | INTRAMUSCULAR | Status: DC

## 2013-05-05 MED ORDER — KETAMINE HCL 10 MG/ML IJ SOLN
5.0000 mg | Freq: Once | INTRAMUSCULAR | Status: AC
  Administered 2013-05-05: 01:00:00 via INTRAVENOUS
  Filled 2013-05-05: qty 1

## 2013-05-05 MED ORDER — ONDANSETRON HCL 4 MG PO TABS: 4.0000 mg | ORAL_TABLET | Freq: Four times a day (QID) | ORAL | Status: DC

## 2013-05-05 MED ORDER — ONDANSETRON HCL 4 MG/2ML IJ SOLN
4.0000 mg | Freq: Once | INTRAMUSCULAR | Status: AC
  Administered 2013-05-05: 4 mg via INTRAVENOUS

## 2013-05-05 NOTE — Discharge Instructions (Signed)
Acute Pancreatitis °Acute pancreatitis is a disease in which the pancreas becomes suddenly inflamed. The pancreas is a large gland located behind your stomach. The pancreas produces enzymes that help digest food. The pancreas also releases the hormones glucagon and insulin that help regulate blood sugar. Damage to the pancreas occurs when the digestive enzymes from the pancreas are activated and begin attacking the pancreas before being released into the intestine. Most acute attacks last a couple of days and can cause serious complications. Some people become dehydrated and develop low blood pressure. In severe cases, bleeding into the pancreas can lead to shock and can be life-threatening. The lungs, heart, and kidneys may fail. °CAUSES  °Pancreatitis can happen to anyone. In some cases, the cause is unknown. Most cases are caused by: °· Alcohol abuse. °· Gallstones. °Other less common causes are: °· Certain medicines. °· Exposure to certain chemicals. °· Infection. °· Damage caused by an accident (trauma). °· Abdominal surgery. °SYMPTOMS  °· Pain in the upper abdomen that may radiate to the back. °· Tenderness and swelling of the abdomen. °· Nausea and vomiting. °DIAGNOSIS  °Your caregiver will perform a physical exam. Blood and stool tests may be done to confirm the diagnosis. Imaging tests may also be done, such as X-rays, CT scans, or an ultrasound of the abdomen. °TREATMENT  °Treatment usually requires a stay in the hospital. Treatment may include: °· Pain medicine. °· Fluid replacement through an intravenous line (IV). °· Placing a tube in the stomach to remove stomach contents and control vomiting. °· Not eating for 3 or 4 days. This gives your pancreas a rest, because enzymes are not being produced that can cause further damage. °· Antibiotic medicines if your condition is caused by an infection. °· Surgery of the pancreas or gallbladder. °HOME CARE INSTRUCTIONS  °· Follow the diet advised by your  caregiver. This may involve avoiding alcohol and decreasing the amount of fat in your diet. °· Eat smaller, more frequent meals. This reduces the amount of digestive juices the pancreas produces. °· Drink enough fluids to keep your urine clear or pale yellow. °· Only take over-the-counter or prescription medicines as directed by your caregiver. °· Avoid drinking alcohol if it caused your condition. °· Do not smoke. °· Get plenty of rest. °· Check your blood sugar at home as directed by your caregiver. °· Keep all follow-up appointments as directed by your caregiver. °SEEK MEDICAL CARE IF:  °· You do not recover as quickly as expected. °· You develop new or worsening symptoms. °· You have persistent pain, weakness, or nausea. °· You recover and then have another episode of pain. °SEEK IMMEDIATE MEDICAL CARE IF:  °· You are unable to eat or keep fluids down. °· Your pain becomes severe. °· You have a fever or persistent symptoms for more than 2 to 3 days. °· You have a fever and your symptoms suddenly get worse. °· Your skin or the white part of your eyes turn yellow (jaundice). °· You develop vomiting. °· You feel dizzy, or you faint. °· Your blood sugar is high (over 300 mg/dL). °MAKE SURE YOU:  °· Understand these instructions. °· Will watch your condition. °· Will get help right away if you are not doing well or get worse. °Document Released: 04/05/2005 Document Revised: 10/05/2011 Document Reviewed: 07/15/2011 °[employer]® Patient Information ©2014 [employer]. ° °

## 2013-05-05 NOTE — ED Notes (Signed)
Pt has hx of pancreatis, left sided abdominal pain, radiating to back, pt [facility] appointment [facility] to have [facility] stent placed.

## 2013-05-05 NOTE — ED Notes (Signed)
Md present for ketamine iv pushes
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
## 2013-05-15 ENCOUNTER — Encounter (HOSPITAL_COMMUNITY): Payer: Self-pay | Admitting: Emergency Medicine

## 2013-05-15 ENCOUNTER — Emergency Department (HOSPITAL_COMMUNITY)
Admission: EM | Admit: 2013-05-15 | Discharge: 2013-05-15 | Disposition: A | Discharge status: Home / Self Care | Payer: Self-pay | Attending: Emergency Medicine | Admitting: Emergency Medicine

## 2013-05-15 DIAGNOSIS — Z8739 Personal history of other diseases of the musculoskeletal system and connective tissue: Secondary | ICD-10-CM | POA: Insufficient documentation

## 2013-05-15 DIAGNOSIS — R52 Pain, unspecified: Secondary | ICD-10-CM | POA: Insufficient documentation

## 2013-05-15 DIAGNOSIS — Z79899 Other long term (current) drug therapy: Secondary | ICD-10-CM | POA: Insufficient documentation

## 2013-05-15 DIAGNOSIS — K219 Gastro-esophageal reflux disease without esophagitis: Secondary | ICD-10-CM | POA: Insufficient documentation

## 2013-05-15 DIAGNOSIS — K861 Other chronic pancreatitis: Secondary | ICD-10-CM | POA: Insufficient documentation

## 2013-05-15 DIAGNOSIS — G8929 Other chronic pain: Secondary | ICD-10-CM | POA: Insufficient documentation

## 2013-05-15 DIAGNOSIS — Z8619 Personal history of other infectious and parasitic diseases: Secondary | ICD-10-CM | POA: Insufficient documentation

## 2013-05-15 DIAGNOSIS — R000 Tachycardia, unspecified: Secondary | ICD-10-CM | POA: Insufficient documentation

## 2013-05-15 DIAGNOSIS — F17200 Nicotine dependence, unspecified, uncomplicated: Secondary | ICD-10-CM | POA: Insufficient documentation

## 2013-05-15 DIAGNOSIS — I10 Essential (primary) hypertension: Secondary | ICD-10-CM | POA: Insufficient documentation

## 2013-05-15 DIAGNOSIS — Z8659 Personal history of other mental and behavioral disorders: Secondary | ICD-10-CM | POA: Insufficient documentation

## 2013-05-15 LAB — BASIC METABOLIC PANEL
BUN: 12 mg/dL (ref 6–23)
CHLORIDE: 103 meq/L (ref 96–112)
CO2: 26 mEq/L (ref 19–32)
CREATININE: 0.55 mg/dL (ref 0.50–1.10)
Calcium: 10 mg/dL (ref 8.4–10.5)
GFR calc non Af Amer: >90 mL/min (ref >90)
GLUCOSE: 124 mg/dL — AB (ref 70–99)
Potassium: 3.9 mEq/L (ref 3.7–5.3)
Sodium: 142 mEq/L (ref 137–147)

## 2013-05-15 LAB — CBC WITH DIFFERENTIAL/PLATELET
Basophils Absolute: 0 10*3/uL (ref 0.0–0.1)
Basophils Relative: 1 % (ref 0–1)
Eosinophils Absolute: 0.1 10*3/uL (ref 0.0–0.7)
Eosinophils Relative: 1 % (ref 0–5)
HEMATOCRIT: 41.1 % (ref 36.0–46.0)
HEMOGLOBIN: 13.5 g/dL (ref 12.0–15.0)
LYMPHS ABS: 1.9 10*3/uL (ref 0.7–4.0)
Lymphocytes Relative: 25 % (ref 12–46)
MCH: 29 pg (ref 26.0–34.0)
MCHC: 32.8 g/dL (ref 30.0–36.0)
MCV: 88.2 fL (ref 78.0–100.0)
MONOS PCT: 10 % (ref 3–12)
Monocytes Absolute: 0.8 10*3/uL (ref 0.1–1.0)
NEUTROS ABS: 4.7 10*3/uL (ref 1.7–7.7)
Neutrophils Relative %: 63 % (ref 43–77)
Platelets: 360 10*3/uL (ref 150–400)
RBC: 4.66 MIL/uL (ref 3.87–5.11)
RDW: 16.9 % — ABNORMAL HIGH (ref 11.5–15.5)
WBC: 7.5 10*3/uL (ref 4.0–10.5)

## 2013-05-15 LAB — LIPASE, BLOOD: Lipase: 103 U/L — ABNORMAL HIGH (ref 11–59)

## 2013-05-15 MED ORDER — PROMETHAZINE HCL 25 MG/ML IJ SOLN
25.0000 mg | Freq: Once | INTRAMUSCULAR | Status: AC
  Administered 2013-05-15: 25 mg via INTRAMUSCULAR
  Filled 2013-05-15: qty 1

## 2013-05-15 MED ORDER — HYDROGEN PEROXIDE 3 % EX SOLN
CUTANEOUS | Status: AC
  Filled 2013-05-15: qty 473
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
## 2013-05-15 NOTE — ED Notes (Signed)
Recently discharged for pancreatitis.  Upper abd pain starting again today radiating to back with n/v/d.

## 2013-07-10 ENCOUNTER — Encounter (HOSPITAL_COMMUNITY): Payer: Self-pay | Admitting: Emergency Medicine

## 2013-07-10 ENCOUNTER — Emergency Department (HOSPITAL_COMMUNITY)
Admission: EM | Admit: 2013-07-10 | Discharge: 2013-07-10 | Disposition: A | Discharge status: Home / Self Care | Payer: Self-pay | Attending: Emergency Medicine | Admitting: Emergency Medicine

## 2013-07-10 ENCOUNTER — Emergency Department (HOSPITAL_COMMUNITY): Payer: Self-pay

## 2013-07-10 DIAGNOSIS — Z8619 Personal history of other infectious and parasitic diseases: Secondary | ICD-10-CM | POA: Insufficient documentation

## 2013-07-10 DIAGNOSIS — R112 Nausea with vomiting, unspecified: Secondary | ICD-10-CM | POA: Insufficient documentation

## 2013-07-10 DIAGNOSIS — R000 Tachycardia, unspecified: Secondary | ICD-10-CM | POA: Insufficient documentation

## 2013-07-10 DIAGNOSIS — Z8659 Personal history of other mental and behavioral disorders: Secondary | ICD-10-CM | POA: Insufficient documentation

## 2013-07-10 DIAGNOSIS — G8929 Other chronic pain: Secondary | ICD-10-CM | POA: Insufficient documentation

## 2013-07-10 DIAGNOSIS — R638 Other symptoms and signs concerning food and fluid intake: Secondary | ICD-10-CM | POA: Insufficient documentation

## 2013-07-10 DIAGNOSIS — Z3202 Encounter for pregnancy test, result negative: Secondary | ICD-10-CM | POA: Insufficient documentation

## 2013-07-10 DIAGNOSIS — Z79899 Other long term (current) drug therapy: Secondary | ICD-10-CM | POA: Insufficient documentation

## 2013-07-10 DIAGNOSIS — R1013 Epigastric pain: Secondary | ICD-10-CM | POA: Insufficient documentation

## 2013-07-10 DIAGNOSIS — K219 Gastro-esophageal reflux disease without esophagitis: Secondary | ICD-10-CM | POA: Insufficient documentation

## 2013-07-10 DIAGNOSIS — R197 Diarrhea, unspecified: Secondary | ICD-10-CM | POA: Insufficient documentation

## 2013-07-10 DIAGNOSIS — R109 Unspecified abdominal pain: Secondary | ICD-10-CM

## 2013-07-10 DIAGNOSIS — I10 Essential (primary) hypertension: Secondary | ICD-10-CM | POA: Insufficient documentation

## 2013-07-10 DIAGNOSIS — F17200 Nicotine dependence, unspecified, uncomplicated: Secondary | ICD-10-CM | POA: Insufficient documentation

## 2013-07-10 DIAGNOSIS — Z8739 Personal history of other diseases of the musculoskeletal system and connective tissue: Secondary | ICD-10-CM | POA: Insufficient documentation

## 2013-07-10 LAB — URINALYSIS, ROUTINE W REFLEX MICROSCOPIC
GLUCOSE, UA: NEGATIVE mg/dL
Hgb urine dipstick: NEGATIVE
Ketones, ur: NEGATIVE mg/dL
Leukocytes, UA: NEGATIVE
Nitrite: NEGATIVE
PH: 6.5 (ref 5.0–8.0)
Protein, ur: NEGATIVE mg/dL
Specific Gravity, Urine: >1.03 — ABNORMAL HIGH (ref 1.005–1.030)
Urobilinogen, UA: 0.2 mg/dL (ref 0.0–1.0)

## 2013-07-10 LAB — CBC WITH DIFFERENTIAL/PLATELET
BASOS ABS: 0 10*3/uL (ref 0.0–0.1)
Basophils Relative: 0 % (ref 0–1)
EOS PCT: 2 % (ref 0–5)
Eosinophils Absolute: 0.2 10*3/uL (ref 0.0–0.7)
HEMATOCRIT: 42.2 % (ref 36.0–46.0)
Hemoglobin: 14.3 g/dL (ref 12.0–15.0)
Lymphocytes Relative: 18 % (ref 12–46)
Lymphs Abs: 1.8 10*3/uL (ref 0.7–4.0)
MCH: 30.6 pg (ref 26.0–34.0)
MCHC: 33.9 g/dL (ref 30.0–36.0)
MCV: 90.2 fL (ref 78.0–100.0)
MONO ABS: 1.1 10*3/uL — AB (ref 0.1–1.0)
Monocytes Relative: 11 % (ref 3–12)
Neutro Abs: 6.7 10*3/uL (ref 1.7–7.7)
Neutrophils Relative %: 69 % (ref 43–77)
PLATELETS: 310 10*3/uL (ref 150–400)
RBC: 4.68 MIL/uL (ref 3.87–5.11)
RDW: 15.3 % (ref 11.5–15.5)
WBC: 9.7 10*3/uL (ref 4.0–10.5)

## 2013-07-10 LAB — COMPREHENSIVE METABOLIC PANEL
ALBUMIN: 3.9 g/dL (ref 3.5–5.2)
ALT: 8 U/L (ref 0–35)
AST: 17 U/L (ref 0–37)
Alkaline Phosphatase: 88 U/L (ref 39–117)
BUN: 12 mg/dL (ref 6–23)
CALCIUM: 9.2 mg/dL (ref 8.4–10.5)
CO2: 26 mEq/L (ref 19–32)
CREATININE: 0.6 mg/dL (ref 0.50–1.10)
Chloride: 104 mEq/L (ref 96–112)
GFR calc Af Amer: >90 mL/min (ref >90)
GFR calc non Af Amer: >90 mL/min (ref >90)
Glucose, Bld: 102 mg/dL — ABNORMAL HIGH (ref 70–99)
Potassium: 3.7 mEq/L (ref 3.7–5.3)
Sodium: 142 mEq/L (ref 137–147)
Total Bilirubin: 0.2 mg/dL — ABNORMAL LOW (ref 0.3–1.2)
Total Protein: 7.5 g/dL (ref 6.0–8.3)

## 2013-07-10 LAB — PREGNANCY, URINE: PREG TEST UR: NEGATIVE

## 2013-07-10 LAB — LIPASE, BLOOD: Lipase: 19 U/L (ref 11–59)

## 2013-07-10 MED ORDER — DIPHENHYDRAMINE HCL 50 MG/ML IJ SOLN
25.0000 mg | Freq: Once | INTRAMUSCULAR | Status: AC
  Administered 2013-07-10: 25 mg via INTRAVENOUS
  Filled 2013-07-10: qty 1

## 2013-07-10 MED ORDER — SODIUM CHLORIDE 0.9 % IV BOLUS (SEPSIS)
1000.0000 mL | Freq: Once | INTRAVENOUS | Status: AC
  Administered 2013-07-10: 1000 mL via INTRAVENOUS

## 2013-07-10 MED ORDER — PROMETHAZINE HCL 25 MG PO TABS: 25.0000 mg | ORAL_TABLET | Freq: Four times a day (QID) | ORAL | Status: DC | PRN

## 2013-07-10 MED ORDER — ONDANSETRON HCL 4 MG/2ML IJ SOLN
4.0000 mg | Freq: Once | INTRAMUSCULAR | Status: AC
  Administered 2013-07-10: 4 mg via INTRAVENOUS
  Filled 2013-07-10: qty 2

## 2013-07-10 MED ORDER — PROMETHAZINE HCL 25 MG/ML IJ SOLN
25.0000 mg | Freq: Once | INTRAMUSCULAR | Status: AC
  Administered 2013-07-10: 25 mg via INTRAVENOUS
  Filled 2013-07-10: qty 1

## 2013-07-10 MED ORDER — HYDROMORPHONE HCL PF 1 MG/ML IJ SOLN
1.0000 mg | Freq: Once | INTRAMUSCULAR | Status: AC
  Administered 2013-07-10: 1 mg via INTRAVENOUS
  Filled 2013-07-10: qty 1

## 2013-07-10 MED ORDER — OXYCODONE-ACETAMINOPHEN 5-325 MG PO TABS
2.0000 | ORAL_TABLET | Freq: Once | ORAL | Status: AC
  Administered 2013-07-10: 2 via ORAL

## 2013-07-10 MED ORDER — OXYCODONE-ACETAMINOPHEN 5-325 MG PO TABS
ORAL_TABLET | ORAL | Status: AC
  Filled 2013-07-10: qty 2

## 2013-07-10 NOTE — ED Notes (Signed)
Pt given a soda per fluid challenge order.

## 2013-07-10 NOTE — Discharge Instructions (Signed)
Abdominal Pain, Adult Follow up with your stomach doctors at [facility]. Return to the ED if you develop new or worsening symptoms.  Many things can cause abdominal pain. Usually, abdominal pain is not caused by a disease and will improve without treatment. It can often be observed and treated at home. Your health care provider will do a physical exam and possibly order blood tests and X-rays to help determine the seriousness of your pain. However, in many cases, more time must pass before a clear cause of the pain can be found. Before that point, your health care provider may not know if you need more testing or further treatment. HOME CARE INSTRUCTIONS  Monitor your abdominal pain for any changes. The following actions may help to alleviate any discomfort you are experiencing:  Only take over-the-counter or prescription medicines as directed by your health care provider.  Do not take laxatives unless directed to do so by your health care provider.  Try a clear liquid diet (broth, tea, or water) as directed by your health care provider. Slowly move to a bland diet as tolerated. SEEK MEDICAL CARE IF:  You have unexplained abdominal pain.  You have abdominal pain associated with nausea or diarrhea.  You have pain when you urinate or have a bowel movement.  You experience abdominal pain that wakes you in the night.  You have abdominal pain that is worsened or improved by eating food.  You have abdominal pain that is worsened with eating fatty foods. SEEK IMMEDIATE MEDICAL CARE IF:   Your pain does not go away within 2 hours.  You have a fever.  You keep throwing up (vomiting).  Your pain is felt only in portions of the abdomen, such as the right side or the left lower portion of the abdomen.  You pass bloody or black tarry stools. MAKE SURE YOU:  Understand these instructions.   Will watch your condition.   Will get help right away if you are not doing well or get worse.   Document Released: 01/13/2005 Document Revised: 01/24/2013 Document Reviewed: 12/13/2012 [facility] Patient Information 2014 [employer] [place].

## 2013-07-10 NOTE — Care Management Note (Signed)
ED/CM noted patient did not have health insurance and/or PCP listed in the computer.  Patient was given the [facility] resource handout with information on the clinics, food pantries, and the handout for new health insurance sign-up.  Patient expressed appreciation for information received. Referred to DSS to ask about disability medicaid. Has applied for disability, and is awaiting determination.

## 2013-07-10 NOTE — ED Notes (Signed)
Pancreatitis flare up since last night with epigastric pain radiating into back and n/v/d.
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
## 2013-07-12 ENCOUNTER — Emergency Department (HOSPITAL_COMMUNITY)
Admission: EM | Admit: 2013-07-12 | Discharge: 2013-07-12 | Disposition: A | Discharge status: Home / Self Care | Payer: Self-pay | Attending: Emergency Medicine | Admitting: Emergency Medicine

## 2013-07-12 ENCOUNTER — Encounter (HOSPITAL_COMMUNITY): Payer: Self-pay | Admitting: Emergency Medicine

## 2013-07-12 DIAGNOSIS — K219 Gastro-esophageal reflux disease without esophagitis: Secondary | ICD-10-CM | POA: Insufficient documentation

## 2013-07-12 DIAGNOSIS — I10 Essential (primary) hypertension: Secondary | ICD-10-CM | POA: Insufficient documentation

## 2013-07-12 DIAGNOSIS — R1110 Vomiting, unspecified: Secondary | ICD-10-CM | POA: Insufficient documentation

## 2013-07-12 DIAGNOSIS — F17200 Nicotine dependence, unspecified, uncomplicated: Secondary | ICD-10-CM | POA: Insufficient documentation

## 2013-07-12 DIAGNOSIS — K2970 Gastritis, unspecified, without bleeding: Secondary | ICD-10-CM | POA: Insufficient documentation

## 2013-07-12 DIAGNOSIS — Z3202 Encounter for pregnancy test, result negative: Secondary | ICD-10-CM | POA: Insufficient documentation

## 2013-07-12 DIAGNOSIS — Z8739 Personal history of other diseases of the musculoskeletal system and connective tissue: Secondary | ICD-10-CM | POA: Insufficient documentation

## 2013-07-12 DIAGNOSIS — G8929 Other chronic pain: Secondary | ICD-10-CM | POA: Insufficient documentation

## 2013-07-12 DIAGNOSIS — Z8659 Personal history of other mental and behavioral disorders: Secondary | ICD-10-CM | POA: Insufficient documentation

## 2013-07-12 DIAGNOSIS — K2990 Gastroduodenitis, unspecified, without bleeding: Secondary | ICD-10-CM

## 2013-07-12 DIAGNOSIS — Z79899 Other long term (current) drug therapy: Secondary | ICD-10-CM | POA: Insufficient documentation

## 2013-07-12 DIAGNOSIS — Z8619 Personal history of other infectious and parasitic diseases: Secondary | ICD-10-CM | POA: Insufficient documentation

## 2013-07-12 DIAGNOSIS — R1013 Epigastric pain: Secondary | ICD-10-CM | POA: Insufficient documentation

## 2013-07-12 LAB — POC URINE PREG, ED: Preg Test, Ur: NEGATIVE

## 2013-07-12 LAB — CBC
HCT: 41.9 % (ref 36.0–46.0)
HEMOGLOBIN: 14.1 g/dL (ref 12.0–15.0)
MCH: 30.3 pg (ref 26.0–34.0)
MCHC: 33.7 g/dL (ref 30.0–36.0)
MCV: 89.9 fL (ref 78.0–100.0)
Platelets: 332 10*3/uL (ref 150–400)
RBC: 4.66 MIL/uL (ref 3.87–5.11)
RDW: 15.1 % (ref 11.5–15.5)
WBC: 13.3 10*3/uL — ABNORMAL HIGH (ref 4.0–10.5)

## 2013-07-12 LAB — COMPREHENSIVE METABOLIC PANEL
ALK PHOS: 95 U/L (ref 39–117)
ALT: 8 U/L (ref 0–35)
AST: 14 U/L (ref 0–37)
Albumin: 4.2 g/dL (ref 3.5–5.2)
BUN: 6 mg/dL (ref 6–23)
CO2: 28 mEq/L (ref 19–32)
Calcium: 9.5 mg/dL (ref 8.4–10.5)
Chloride: 103 mEq/L (ref 96–112)
Creatinine, Ser: 0.67 mg/dL (ref 0.50–1.10)
GFR calc Af Amer: >90 mL/min (ref >90)
GFR calc non Af Amer: >90 mL/min (ref >90)
GLUCOSE: 114 mg/dL — AB (ref 70–99)
POTASSIUM: 3.7 meq/L (ref 3.7–5.3)
SODIUM: 143 meq/L (ref 137–147)
Total Bilirubin: 0.2 mg/dL — ABNORMAL LOW (ref 0.3–1.2)
Total Protein: 8.2 g/dL (ref 6.0–8.3)

## 2013-07-12 LAB — LIPASE, BLOOD: Lipase: 17 U/L (ref 11–59)

## 2013-07-12 MED ORDER — LORAZEPAM 2 MG/ML IJ SOLN
1.0000 mg | Freq: Once | INTRAMUSCULAR | Status: AC
  Administered 2013-07-12: 1 mg via INTRAVENOUS
  Filled 2013-07-12: qty 1

## 2013-07-12 MED ORDER — PROMETHAZINE HCL 25 MG PO TABS: 25.0000 mg | ORAL_TABLET | Freq: Four times a day (QID) | ORAL | Status: DC | PRN

## 2013-07-12 MED ORDER — ONDANSETRON HCL 4 MG/2ML IJ SOLN
4.0000 mg | Freq: Once | INTRAMUSCULAR | Status: AC
  Administered 2013-07-12: 4 mg via INTRAVENOUS
  Filled 2013-07-12: qty 2

## 2013-07-12 MED ORDER — HYDROMORPHONE HCL PF 1 MG/ML IJ SOLN
1.0000 mg | Freq: Once | INTRAMUSCULAR | Status: AC
  Administered 2013-07-12: 1 mg via INTRAVENOUS
  Filled 2013-07-12: qty 1

## 2013-07-12 MED ORDER — SODIUM CHLORIDE 0.9 % IV BOLUS (SEPSIS)
1000.0000 mL | Freq: Once | INTRAVENOUS | Status: AC
  Administered 2013-07-12: 1000 mL via INTRAVENOUS

## 2013-07-12 MED ORDER — PROMETHAZINE HCL 25 MG/ML IJ SOLN
25.0000 mg | Freq: Once | INTRAMUSCULAR | Status: AC
  Administered 2013-07-12: 25 mg via INTRAVENOUS
  Filled 2013-07-12: qty 1

## 2013-07-12 NOTE — Discharge Instructions (Signed)
Abdominal Pain, Adult °Many things can cause abdominal pain. Usually, abdominal pain is not caused by a disease and will improve without treatment. It can often be observed and treated at home. Your health care provider will do a physical exam and possibly order blood tests and X-rays to help determine the seriousness of your pain. However, in many cases, more time must pass before a clear cause of the pain can be found. Before that point, your health care provider may not know if you need more testing or further treatment. °HOME CARE INSTRUCTIONS  °Monitor your abdominal pain for any changes. The following actions may help to alleviate any discomfort you are experiencing: °· Only take over-the-counter or prescription medicines as directed by your health care provider. °· Do not take laxatives unless directed to do so by your health care provider. °· Try a clear liquid diet (broth, tea, or water) as directed by your health care provider. Slowly move to a bland diet as tolerated. °SEEK MEDICAL CARE IF: °· You have unexplained abdominal pain. °· You have abdominal pain associated with nausea or diarrhea. °· You have pain when you urinate or have a bowel movement. °· You experience abdominal pain that wakes you in the night. °· You have abdominal pain that is worsened or improved by eating food. °· You have abdominal pain that is worsened with eating fatty foods. °SEEK IMMEDIATE MEDICAL CARE IF:  °· Your pain does not go away within 2 hours. °· You have a fever. °· You keep throwing up (vomiting). °· Your pain is felt only in portions of the abdomen, such as the right side or the left lower portion of the abdomen. °· You pass bloody or black tarry stools. °MAKE SURE YOU: °· Understand these instructions.   °· Will watch your condition.   °· Will get help right away if you are not doing well or get worse.   °Document Released: 01/13/2005 Document Revised: 01/24/2013 Document Reviewed: 12/13/2012 °[employer]® Patient  Information ©2014 [employer]. ° °
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
## 2013-07-12 NOTE — ED Notes (Signed)
Patient with no complaints at this time. Respirations even and unlabored. Skin warm/dry. Discharge instructions reviewed with patient at this time. Patient given opportunity to voice concerns/ask questions. IV removed per policy and band-aid applied to site. Patient discharged at this time and left Emergency Department with steady gait.  

## 2013-07-12 NOTE — ED Notes (Signed)
Pt with nausea, vomiting, and abdominal pain off and on for two days. States she has been vomiting nom-stop for last six hrs. Rates abdominal pain as 10/10.

## 2013-07-12 NOTE — ED Notes (Signed)
Pt reports vomiting started again for the past 2 days, thought it was getting better but got so she could not take any more tonight.

## 2013-07-12 NOTE — ED Notes (Signed)
Offered pt fluids. Pt not ready to try yet. Instructed pt to call when she felt like she might be able to tolerate something to drink. Pt stated she would.

## 2013-07-13 ENCOUNTER — Encounter (HOSPITAL_COMMUNITY): Payer: Self-pay | Admitting: Emergency Medicine

## 2013-07-13 ENCOUNTER — Emergency Department (HOSPITAL_COMMUNITY)
Admission: EM | Admit: 2013-07-13 | Discharge: 2013-07-13 | Discharge status: Against Medical Advice | Payer: Self-pay | Attending: Emergency Medicine | Admitting: Emergency Medicine

## 2013-07-13 ENCOUNTER — Emergency Department (HOSPITAL_COMMUNITY)
Admission: EM | Admit: 2013-07-13 | Discharge: 2013-07-13 | Disposition: A | Discharge status: Home / Self Care | Payer: Self-pay | Attending: Emergency Medicine | Admitting: Emergency Medicine

## 2013-07-13 DIAGNOSIS — I10 Essential (primary) hypertension: Secondary | ICD-10-CM | POA: Insufficient documentation

## 2013-07-13 DIAGNOSIS — G8929 Other chronic pain: Secondary | ICD-10-CM | POA: Insufficient documentation

## 2013-07-13 DIAGNOSIS — Z8739 Personal history of other diseases of the musculoskeletal system and connective tissue: Secondary | ICD-10-CM | POA: Insufficient documentation

## 2013-07-13 DIAGNOSIS — Z8619 Personal history of other infectious and parasitic diseases: Secondary | ICD-10-CM | POA: Insufficient documentation

## 2013-07-13 DIAGNOSIS — Z8659 Personal history of other mental and behavioral disorders: Secondary | ICD-10-CM | POA: Insufficient documentation

## 2013-07-13 DIAGNOSIS — F17200 Nicotine dependence, unspecified, uncomplicated: Secondary | ICD-10-CM | POA: Insufficient documentation

## 2013-07-13 DIAGNOSIS — R112 Nausea with vomiting, unspecified: Secondary | ICD-10-CM | POA: Insufficient documentation

## 2013-07-13 DIAGNOSIS — R109 Unspecified abdominal pain: Secondary | ICD-10-CM | POA: Insufficient documentation

## 2013-07-13 DIAGNOSIS — K219 Gastro-esophageal reflux disease without esophagitis: Secondary | ICD-10-CM | POA: Insufficient documentation

## 2013-07-13 DIAGNOSIS — Z79899 Other long term (current) drug therapy: Secondary | ICD-10-CM | POA: Insufficient documentation

## 2013-07-13 MED ORDER — SODIUM CHLORIDE 0.9 % IV SOLN: 1000.0000 mL | INTRAVENOUS | Status: DC

## 2013-07-13 MED ORDER — SODIUM CHLORIDE 0.9 % IV SOLN
1000.0000 mL | Freq: Once | INTRAVENOUS | Status: AC
  Administered 2013-07-13: 1000 mL via INTRAVENOUS

## 2013-07-13 MED ORDER — KETAMINE HCL 10 MG/ML IJ SOLN
13.0000 mg | Freq: Once | INTRAMUSCULAR | Status: AC
  Administered 2013-07-13: 13 mg via INTRAVENOUS
  Filled 2013-07-13: qty 1.3

## 2013-07-13 MED ORDER — OXYCODONE-ACETAMINOPHEN 5-325 MG PO TABS
1.0000 | ORAL_TABLET | Freq: Once | ORAL | Status: AC
  Administered 2013-07-13: 1 via ORAL
  Filled 2013-07-13: qty 1

## 2013-07-13 MED ORDER — ONDANSETRON HCL 4 MG/2ML IJ SOLN
4.0000 mg | Freq: Once | INTRAMUSCULAR | Status: AC
  Administered 2013-07-13: 4 mg via INTRAVENOUS
  Filled 2013-07-13: qty 2

## 2013-07-13 MED ORDER — PROMETHAZINE HCL 25 MG RE SUPP: 25.0000 mg | Freq: Four times a day (QID) | RECTAL | Status: DC | PRN

## 2013-07-13 NOTE — ED Notes (Signed)
Pt not in any of the waiting areas x 3.   Mentioned to triage nurse that she may just go home and take more of her zofran

## 2013-07-13 NOTE — ED Notes (Signed)
Dr. Patria Maneampos pushing Ketamine at this time and remains at the bedside. Will remain at bedside and monitor patient.

## 2013-07-13 NOTE — ED Notes (Signed)
Seen here in past 24 hours, sent home with phenergan, now persistent vomiting, 10/10 pain

## 2013-07-13 NOTE — ED Notes (Signed)
Pt. reports RUQ pain radiating to mid back with persistent emesis , seen [facility] yesterday for the same complaint - blood test /urine test done .
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
## 2013-07-13 NOTE — Discharge Instructions (Signed)
Abdominal Pain, Adult °Many things can cause abdominal pain. Usually, abdominal pain is not caused by a disease and will improve without treatment. It can often be observed and treated at home. Your health care provider will do a physical exam and possibly order blood tests and X-rays to help determine the seriousness of your pain. However, in many cases, more time must pass before a clear cause of the pain can be found. Before that point, your health care provider may not know if you need more testing or further treatment. °HOME CARE INSTRUCTIONS  °Monitor your abdominal pain for any changes. The following actions may help to alleviate any discomfort you are experiencing: °· Only take over-the-counter or prescription medicines as directed by your health care provider. °· Do not take laxatives unless directed to do so by your health care provider. °· Try a clear liquid diet (broth, tea, or water) as directed by your health care provider. Slowly move to a bland diet as tolerated. °SEEK MEDICAL CARE IF: °· You have unexplained abdominal pain. °· You have abdominal pain associated with nausea or diarrhea. °· You have pain when you urinate or have a bowel movement. °· You experience abdominal pain that wakes you in the night. °· You have abdominal pain that is worsened or improved by eating food. °· You have abdominal pain that is worsened with eating fatty foods. °SEEK IMMEDIATE MEDICAL CARE IF:  °· Your pain does not go away within 2 hours. °· You have a fever. °· You keep throwing up (vomiting). °· Your pain is felt only in portions of the abdomen, such as the right side or the left lower portion of the abdomen. °· You pass bloody or black tarry stools. °MAKE SURE YOU: °· Understand these instructions.   °· Will watch your condition.   °· Will get help right away if you are not doing well or get worse.   °Document Released: 01/13/2005 Document Revised: 01/24/2013 Document Reviewed: 12/13/2012 °[employer]® Patient  Information ©2014 [employer]. ° °

## 2013-07-13 NOTE — ED Notes (Signed)
Unable to locate pt in all waiting areas 

## 2013-10-08 ENCOUNTER — Emergency Department (HOSPITAL_COMMUNITY)
Admission: EM | Admit: 2013-10-08 | Discharge: 2013-10-09 | Disposition: A | Discharge status: Home / Self Care | Payer: 59 | Attending: Emergency Medicine | Admitting: Emergency Medicine

## 2013-10-08 ENCOUNTER — Emergency Department (HOSPITAL_COMMUNITY): Payer: 59

## 2013-10-08 ENCOUNTER — Encounter (HOSPITAL_COMMUNITY): Payer: Self-pay | Admitting: Emergency Medicine

## 2013-10-08 DIAGNOSIS — F17200 Nicotine dependence, unspecified, uncomplicated: Secondary | ICD-10-CM | POA: Insufficient documentation

## 2013-10-08 DIAGNOSIS — K219 Gastro-esophageal reflux disease without esophagitis: Secondary | ICD-10-CM | POA: Insufficient documentation

## 2013-10-08 DIAGNOSIS — G8929 Other chronic pain: Secondary | ICD-10-CM | POA: Insufficient documentation

## 2013-10-08 DIAGNOSIS — Z3202 Encounter for pregnancy test, result negative: Secondary | ICD-10-CM | POA: Insufficient documentation

## 2013-10-08 DIAGNOSIS — R1013 Epigastric pain: Secondary | ICD-10-CM | POA: Insufficient documentation

## 2013-10-08 DIAGNOSIS — Z79899 Other long term (current) drug therapy: Secondary | ICD-10-CM | POA: Insufficient documentation

## 2013-10-08 DIAGNOSIS — Z8659 Personal history of other mental and behavioral disorders: Secondary | ICD-10-CM | POA: Insufficient documentation

## 2013-10-08 DIAGNOSIS — R109 Unspecified abdominal pain: Secondary | ICD-10-CM

## 2013-10-08 DIAGNOSIS — Z8619 Personal history of other infectious and parasitic diseases: Secondary | ICD-10-CM | POA: Insufficient documentation

## 2013-10-08 DIAGNOSIS — Z8679 Personal history of other diseases of the circulatory system: Secondary | ICD-10-CM | POA: Insufficient documentation

## 2013-10-08 LAB — COMPREHENSIVE METABOLIC PANEL
ALBUMIN: 3.7 g/dL (ref 3.5–5.2)
ALK PHOS: 91 U/L (ref 39–117)
ALT: 28 U/L (ref 0–35)
AST: 19 U/L (ref 0–37)
BUN: 11 mg/dL (ref 6–23)
CO2: 25 mEq/L (ref 19–32)
CREATININE: 0.53 mg/dL (ref 0.50–1.10)
Calcium: 9.2 mg/dL (ref 8.4–10.5)
Chloride: 100 mEq/L (ref 96–112)
GFR calc non Af Amer: >90 mL/min (ref >90)
GLUCOSE: 107 mg/dL — AB (ref 70–99)
POTASSIUM: 4.4 meq/L (ref 3.7–5.3)
Sodium: 137 mEq/L (ref 137–147)
Total Bilirubin: <0.2 mg/dL — ABNORMAL LOW (ref 0.3–1.2)
Total Protein: 7.1 g/dL (ref 6.0–8.3)

## 2013-10-08 LAB — CBC WITH DIFFERENTIAL/PLATELET
BASOS ABS: 0.1 10*3/uL (ref 0.0–0.1)
Basophils Relative: 1 % (ref 0–1)
Eosinophils Absolute: 0.3 10*3/uL (ref 0.0–0.7)
Eosinophils Relative: 3 % (ref 0–5)
HCT: 36.6 % (ref 36.0–46.0)
Hemoglobin: 12.2 g/dL (ref 12.0–15.0)
LYMPHS ABS: 3.7 10*3/uL (ref 0.7–4.0)
LYMPHS PCT: 38 % (ref 12–46)
MCH: 29.3 pg (ref 26.0–34.0)
MCHC: 33.3 g/dL (ref 30.0–36.0)
MCV: 88 fL (ref 78.0–100.0)
Monocytes Absolute: 0.9 10*3/uL (ref 0.1–1.0)
Monocytes Relative: 9 % (ref 3–12)
NEUTROS ABS: 4.9 10*3/uL (ref 1.7–7.7)
Neutrophils Relative %: 49 % (ref 43–77)
PLATELETS: 447 10*3/uL — AB (ref 150–400)
RBC: 4.16 MIL/uL (ref 3.87–5.11)
RDW: 15.7 % — AB (ref 11.5–15.5)
WBC: 9.9 10*3/uL (ref 4.0–10.5)

## 2013-10-08 LAB — URINALYSIS, ROUTINE W REFLEX MICROSCOPIC
Bilirubin Urine: NEGATIVE
GLUCOSE, UA: NEGATIVE mg/dL
Hgb urine dipstick: NEGATIVE
KETONES UR: NEGATIVE mg/dL
Leukocytes, UA: NEGATIVE
Nitrite: NEGATIVE
PROTEIN: NEGATIVE mg/dL
Specific Gravity, Urine: 1.02 (ref 1.005–1.030)
UROBILINOGEN UA: 0.2 mg/dL (ref 0.0–1.0)
pH: 6.5 (ref 5.0–8.0)

## 2013-10-08 LAB — AMYLASE: AMYLASE: 85 U/L (ref 0–105)

## 2013-10-08 LAB — LIPASE, BLOOD: Lipase: 40 U/L (ref 11–59)

## 2013-10-08 LAB — PREGNANCY, URINE: Preg Test, Ur: NEGATIVE

## 2013-10-08 MED ORDER — ONDANSETRON HCL 4 MG/2ML IJ SOLN
4.0000 mg | Freq: Once | INTRAMUSCULAR | Status: AC
  Administered 2013-10-08: 4 mg via INTRAVENOUS
  Filled 2013-10-08: qty 2

## 2013-10-08 MED ORDER — SODIUM CHLORIDE 0.9 % IV SOLN
INTRAVENOUS | Status: DC
  Administered 2013-10-08: 1000 mL via INTRAVENOUS

## 2013-10-08 MED ORDER — IOHEXOL 300 MG/ML  SOLN: 100.0000 mL | Freq: Once | INTRAMUSCULAR | Status: AC | PRN

## 2013-10-08 MED ORDER — HYDROMORPHONE HCL PF 1 MG/ML IJ SOLN
1.0000 mg | Freq: Once | INTRAMUSCULAR | Status: AC
  Administered 2013-10-08: 1 mg via INTRAVENOUS
  Filled 2013-10-08: qty 1

## 2013-10-08 MED ORDER — IOHEXOL 300 MG/ML  SOLN
25.0000 mL | Freq: Once | INTRAMUSCULAR | Status: AC | PRN
  Administered 2013-10-08: 50 mL via ORAL

## 2013-10-08 MED ORDER — SODIUM CHLORIDE 0.9 % IV BOLUS (SEPSIS)
1000.0000 mL | Freq: Once | INTRAVENOUS | Status: AC
  Administered 2013-10-08: 1000 mL via INTRAVENOUS

## 2013-10-08 NOTE — ED Notes (Signed)
Patient states that she has a history of pancreatitis and 2 days ago her stomach starting hurting and cannot get any relief of pain

## 2013-10-08 NOTE — ED Notes (Signed)
States that pain medicine eased pain down to about a 7. States now that pain is back between a 9 to a 10. Moaning and grimacing at this time.
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
## 2013-10-09 MED ORDER — HYDROMORPHONE HCL PF 1 MG/ML IJ SOLN
INTRAMUSCULAR | Status: AC
  Filled 2013-10-09: qty 1

## 2013-10-09 MED ORDER — HYDROMORPHONE HCL PF 1 MG/ML IJ SOLN
1.0000 mg | Freq: Once | INTRAMUSCULAR | Status: AC
  Administered 2013-10-09: 1 mg via INTRAVENOUS

## 2013-10-09 MED ORDER — ONDANSETRON HCL 4 MG/2ML IJ SOLN
4.0000 mg | Freq: Once | INTRAMUSCULAR | Status: AC
  Administered 2013-10-09: 4 mg via INTRAVENOUS

## 2013-10-09 MED ORDER — ONDANSETRON HCL 4 MG/2ML IJ SOLN
INTRAMUSCULAR | Status: AC
  Filled 2013-10-09: qty 2

## 2013-10-09 MED ORDER — IOHEXOL 300 MG/ML  SOLN
100.0000 mL | Freq: Once | INTRAMUSCULAR | Status: AC | PRN
  Administered 2013-10-09: 100 mL via INTRAVENOUS

## 2013-10-09 NOTE — Discharge Instructions (Signed)
Abdominal Pain, Women °Abdominal (stomach, pelvic, or belly) pain can be caused by many things. It is important to tell your doctor: °· The location of the pain. °· Does it come and go or is it present all the time? °· Are there things that start the pain (eating certain foods, exercise)? °· Are there other symptoms associated with the pain (fever, nausea, vomiting, diarrhea)? °All of this is helpful to know when trying to find the cause of the pain. °CAUSES  °· Stomach: virus or bacteria infection, or ulcer. °· Intestine: appendicitis (inflamed appendix), regional ileitis (Crohn's disease), ulcerative colitis (inflamed colon), irritable bowel syndrome, diverticulitis (inflamed diverticulum of the colon), or cancer of the stomach or intestine. °· Gallbladder disease or stones in the gallbladder. °· Kidney disease, kidney stones, or infection. °· Pancreas infection or cancer. °· Fibromyalgia (pain disorder). °· Diseases of the female organs: °¨ Uterus: fibroid (non-cancerous) tumors or infection. °¨ Fallopian tubes: infection or tubal pregnancy. °¨ Ovary: cysts or tumors. °¨ Pelvic adhesions (scar tissue). °¨ Endometriosis (uterus lining tissue growing in the pelvis and on the pelvic organs). °¨ Pelvic congestion syndrome (female organs filling up with blood just before the menstrual period). °¨ Pain with the menstrual period. °¨ Pain with ovulation (producing an egg). °¨ Pain with an IUD (intrauterine device, birth control) in the uterus. °¨ Cancer of the female organs. °· Functional pain (pain not caused by a disease, may improve without treatment). °· Psychological pain. °· Depression. °DIAGNOSIS  °Your doctor will decide the seriousness of your pain by doing an examination. °· Blood tests. °· X-rays. °· Ultrasound. °· CT scan (computed tomography, special type of X-ray). °· MRI (magnetic resonance imaging). °· Cultures, for infection. °· Barium enema (dye inserted in the large intestine, to better view it with  X-rays). °· Colonoscopy (looking in intestine with a lighted tube). °· Laparoscopy (minor surgery, looking in abdomen with a lighted tube). °· Major abdominal exploratory surgery (looking in abdomen with a large incision). °TREATMENT  °The treatment will depend on the cause of the pain.  °· Many cases can be observed and treated at home. °· Over-the-counter medicines recommended by your caregiver. °· Prescription medicine. °· Antibiotics, for infection. °· Birth control pills, for painful periods or for ovulation pain. °· Hormone treatment, for endometriosis. °· Nerve blocking injections. °· Physical therapy. °· Antidepressants. °· Counseling with a psychologist or psychiatrist. °· Minor or major surgery. °HOME CARE INSTRUCTIONS  °· Do not take laxatives, unless directed by your caregiver. °· Take over-the-counter pain medicine only if ordered by your caregiver. Do not take aspirin because it can cause an upset stomach or bleeding. °· Try a clear liquid diet (broth or water) as ordered by your caregiver. Slowly move to a bland diet, as tolerated, if the pain is related to the stomach or intestine. °· Have a thermometer and take your temperature several times a day, and record it. °· Bed rest and sleep, if it helps the pain. °· Avoid sexual intercourse, if it causes pain. °· Avoid stressful situations. °· Keep your follow-up appointments and tests, as your caregiver orders. °· If the pain does not go away with medicine or surgery, you may try: °¨ Acupuncture. °¨ Relaxation exercises (yoga, meditation). °¨ Group therapy. °¨ Counseling. °SEEK MEDICAL CARE IF:  °· You notice certain foods cause stomach pain. °· Your home care treatment is not helping your pain. °· You need stronger pain medicine. °· You want your IUD removed. °· You feel faint or   lightheaded. °· You develop nausea and vomiting. °· You develop a rash. °· You are having side effects or an allergy to your medicine. °SEEK IMMEDIATE MEDICAL CARE IF:  °· Your  pain does not go away or gets worse. °· You have a fever. °· Your pain is felt only in portions of the abdomen. The right side could possibly be appendicitis. The left lower portion of the abdomen could be colitis or diverticulitis. °· You are passing blood in your stools (bright red or black tarry stools, with or without vomiting). °· You have blood in your urine. °· You develop chills, with or without a fever. °· You pass out. °MAKE SURE YOU:  °· Understand these instructions. °· Will watch your condition. °· Will get help right away if you are not doing well or get worse. °Document Released: 01/31/2007 Document Revised: 06/28/2011 Document Reviewed: 02/20/2009 °ExitCare® Patient Information ©2015 [employer]. This information is not intended to replace advice given to you by your health care provider. Make sure you discuss any questions you have with your health care provider. ° °

## 2013-10-09 NOTE — ED Notes (Signed)
C/o nausea.  No vomiting 

## 2014-02-07 ENCOUNTER — Emergency Department (HOSPITAL_COMMUNITY): Payer: 59

## 2014-02-07 ENCOUNTER — Emergency Department (HOSPITAL_COMMUNITY)
Admission: EM | Admit: 2014-02-07 | Discharge: 2014-02-07 | Disposition: A | Discharge status: Home / Self Care | Payer: 59 | Attending: Emergency Medicine | Admitting: Emergency Medicine

## 2014-02-07 ENCOUNTER — Encounter (HOSPITAL_COMMUNITY): Payer: Self-pay | Admitting: Emergency Medicine

## 2014-02-07 DIAGNOSIS — R1013 Epigastric pain: Secondary | ICD-10-CM | POA: Insufficient documentation

## 2014-02-07 DIAGNOSIS — Z8619 Personal history of other infectious and parasitic diseases: Secondary | ICD-10-CM | POA: Diagnosis not present

## 2014-02-07 DIAGNOSIS — F17210 Nicotine dependence, cigarettes, uncomplicated: Secondary | ICD-10-CM | POA: Insufficient documentation

## 2014-02-07 DIAGNOSIS — R109 Unspecified abdominal pain: Secondary | ICD-10-CM

## 2014-02-07 DIAGNOSIS — Z79899 Other long term (current) drug therapy: Secondary | ICD-10-CM | POA: Diagnosis not present

## 2014-02-07 DIAGNOSIS — K219 Gastro-esophageal reflux disease without esophagitis: Secondary | ICD-10-CM | POA: Diagnosis not present

## 2014-02-07 DIAGNOSIS — Z87828 Personal history of other (healed) physical injury and trauma: Secondary | ICD-10-CM | POA: Diagnosis not present

## 2014-02-07 DIAGNOSIS — Z8739 Personal history of other diseases of the musculoskeletal system and connective tissue: Secondary | ICD-10-CM | POA: Diagnosis not present

## 2014-02-07 DIAGNOSIS — Z3A01 Less than 8 weeks gestation of pregnancy: Secondary | ICD-10-CM | POA: Insufficient documentation

## 2014-02-07 DIAGNOSIS — O10011 Pre-existing essential hypertension complicating pregnancy, first trimester: Secondary | ICD-10-CM | POA: Insufficient documentation

## 2014-02-07 DIAGNOSIS — O99611 Diseases of the digestive system complicating pregnancy, first trimester: Secondary | ICD-10-CM | POA: Diagnosis not present

## 2014-02-07 DIAGNOSIS — O9989 Other specified diseases and conditions complicating pregnancy, childbirth and the puerperium: Secondary | ICD-10-CM | POA: Diagnosis present

## 2014-02-07 DIAGNOSIS — G8929 Other chronic pain: Secondary | ICD-10-CM | POA: Diagnosis not present

## 2014-02-07 DIAGNOSIS — O210 Mild hyperemesis gravidarum: Secondary | ICD-10-CM | POA: Diagnosis not present

## 2014-02-07 DIAGNOSIS — O99331 Smoking (tobacco) complicating pregnancy, first trimester: Secondary | ICD-10-CM | POA: Insufficient documentation

## 2014-02-07 DIAGNOSIS — O26899 Other specified pregnancy related conditions, unspecified trimester: Secondary | ICD-10-CM

## 2014-02-07 LAB — URINALYSIS, ROUTINE W REFLEX MICROSCOPIC
Bilirubin Urine: NEGATIVE
GLUCOSE, UA: NEGATIVE mg/dL
Hgb urine dipstick: NEGATIVE
KETONES UR: NEGATIVE mg/dL
Leukocytes, UA: NEGATIVE
Nitrite: NEGATIVE
PH: 6.5 (ref 5.0–8.0)
Protein, ur: NEGATIVE mg/dL
Specific Gravity, Urine: 1.025 (ref 1.005–1.030)
Urobilinogen, UA: 0.2 mg/dL (ref 0.0–1.0)

## 2014-02-07 LAB — CBC WITH DIFFERENTIAL/PLATELET
BASOS ABS: 0 10*3/uL (ref 0.0–0.1)
Basophils Relative: 0 % (ref 0–1)
Eosinophils Absolute: 0.1 10*3/uL (ref 0.0–0.7)
Eosinophils Relative: 1 % (ref 0–5)
HEMATOCRIT: 39 % (ref 36.0–46.0)
HEMOGLOBIN: 13.3 g/dL (ref 12.0–15.0)
LYMPHS ABS: 2.1 10*3/uL (ref 0.7–4.0)
LYMPHS PCT: 23 % (ref 12–46)
MCH: 29.9 pg (ref 26.0–34.0)
MCHC: 34.1 g/dL (ref 30.0–36.0)
MCV: 87.6 fL (ref 78.0–100.0)
MONO ABS: 1 10*3/uL (ref 0.1–1.0)
MONOS PCT: 11 % (ref 3–12)
Neutro Abs: 5.9 10*3/uL (ref 1.7–7.7)
Neutrophils Relative %: 65 % (ref 43–77)
Platelets: 298 10*3/uL (ref 150–400)
RBC: 4.45 MIL/uL (ref 3.87–5.11)
RDW: 14.5 % (ref 11.5–15.5)
WBC: 9.1 10*3/uL (ref 4.0–10.5)

## 2014-02-07 LAB — LIPASE, BLOOD: Lipase: 21 U/L (ref 11–59)

## 2014-02-07 LAB — COMPREHENSIVE METABOLIC PANEL
ALT: 10 U/L (ref 0–35)
AST: 12 U/L (ref 0–37)
Albumin: 4.1 g/dL (ref 3.5–5.2)
Alkaline Phosphatase: 75 U/L (ref 39–117)
Anion gap: 11 (ref 5–15)
BILIRUBIN TOTAL: 0.3 mg/dL (ref 0.3–1.2)
BUN: 9 mg/dL (ref 6–23)
CHLORIDE: 101 meq/L (ref 96–112)
CO2: 23 mEq/L (ref 19–32)
CREATININE: 0.52 mg/dL (ref 0.50–1.10)
Calcium: 9.4 mg/dL (ref 8.4–10.5)
GFR calc non Af Amer: >90 mL/min (ref >90)
GLUCOSE: 94 mg/dL (ref 70–99)
Potassium: 3.9 mEq/L (ref 3.7–5.3)
Sodium: 135 mEq/L — ABNORMAL LOW (ref 137–147)
Total Protein: 7.7 g/dL (ref 6.0–8.3)

## 2014-02-07 LAB — HCG, QUANTITATIVE, PREGNANCY: hCG, Beta Chain, Quant, S: 61624 m[IU]/mL — ABNORMAL HIGH (ref <5)

## 2014-02-07 LAB — POC URINE PREG, ED: Preg Test, Ur: POSITIVE — AB

## 2014-02-07 MED ORDER — PROMETHAZINE HCL 25 MG PO TABS: 25.0000 mg | ORAL_TABLET | Freq: Four times a day (QID) | ORAL | Status: DC | PRN

## 2014-02-07 MED ORDER — ONDANSETRON HCL 4 MG/2ML IJ SOLN
4.0000 mg | Freq: Once | INTRAMUSCULAR | Status: AC
  Administered 2014-02-07: 4 mg via INTRAVENOUS
  Filled 2014-02-07: qty 2

## 2014-02-07 MED ORDER — PRENATAL COMPLETE 14-0.4 MG PO TABS: 1.0000 | ORAL_TABLET | Freq: Every day | ORAL | Status: DC

## 2014-02-07 MED ORDER — HYDROMORPHONE HCL 1 MG/ML IJ SOLN
1.0000 mg | Freq: Once | INTRAMUSCULAR | Status: DC
  Filled 2014-02-07: qty 1

## 2014-02-07 MED ORDER — SODIUM CHLORIDE 0.9 % IV BOLUS (SEPSIS)
1000.0000 mL | Freq: Once | INTRAVENOUS | Status: AC
  Administered 2014-02-07: 1000 mL via INTRAVENOUS

## 2014-02-07 MED ORDER — PROMETHAZINE HCL 25 MG RE SUPP: 25.0000 mg | Freq: Four times a day (QID) | RECTAL | Status: DC | PRN
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
## 2014-02-07 NOTE — ED Notes (Signed)
ruq abdominal pain with vomiting and abdominal pain since 430 am. States it is her pancreatitis.

## 2014-02-07 NOTE — Discharge Instructions (Signed)
Abdominal Pain During Pregnancy °Belly (abdominal) pain is common during pregnancy. Most of the time, it is not a serious problem. Other times, it can be a sign that something is wrong with the pregnancy. Always tell your doctor if you have belly pain. °HOME CARE °Monitor your belly pain for any changes. The following actions may help you feel better: °· Do not have sex (intercourse) or put anything in your vagina until you feel better. °· Rest until your pain stops. °· Drink clear fluids if you feel sick to your stomach (nauseous). Do not eat solid food until you feel better. °· Only take medicine as told by your doctor. °· Keep all doctor visits as told. °GET HELP RIGHT AWAY IF:  °· You are bleeding, leaking fluid, or pieces of tissue come out of your vagina. °· You have more pain or cramping. °· You keep throwing up (vomiting). °· You have pain when you pee (urinate) or have blood in your pee. °· You have a fever. °· You do not feel your baby moving as much. °· You feel very weak or feel like passing out. °· You have trouble breathing, with or without belly pain. °· You have a very bad headache and belly pain. °· You have fluid leaking from your vagina and belly pain. °· You keep having watery poop (diarrhea). °· Your belly pain does not go away after resting, or the pain gets worse. °MAKE SURE YOU:  °· Understand these instructions. °· Will watch your condition. °· Will get help right away if you are not doing well or get worse. °Document Released: 03/24/2009 Document Revised: 12/06/2012 Document Reviewed: 11/02/2012 °ExitCare® Patient Information ©2015 [employer]. This information is not intended to replace advice given to you by your health care provider. Make sure you discuss any questions you have with your health care provider. ° °

## 2014-02-12 ENCOUNTER — Ambulatory Visit (INDEPENDENT_AMBULATORY_CARE_PROVIDER_SITE_OTHER): Payer: 59 | Admitting: Women's Health

## 2014-02-12 ENCOUNTER — Other Ambulatory Visit (HOSPITAL_COMMUNITY)
Admission: RE | Admit: 2014-02-12 | Discharge: 2014-02-12 | Disposition: A | Discharge status: Home / Self Care | Payer: 59 | Source: Ambulatory Visit | Attending: Obstetrics & Gynecology | Admitting: Obstetrics & Gynecology

## 2014-02-12 ENCOUNTER — Other Ambulatory Visit: Payer: Self-pay

## 2014-02-12 ENCOUNTER — Encounter: Payer: Self-pay | Admitting: Women's Health

## 2014-02-12 VITALS — BP 102/68 | Wt 127.0 lb

## 2014-02-12 DIAGNOSIS — Z3481 Encounter for supervision of other normal pregnancy, first trimester: Secondary | ICD-10-CM

## 2014-02-12 DIAGNOSIS — Z3682 Encounter for antenatal screening for nuchal translucency: Secondary | ICD-10-CM

## 2014-02-12 DIAGNOSIS — R87810 Cervical high risk human papillomavirus (HPV) DNA test positive: Secondary | ICD-10-CM | POA: Diagnosis present

## 2014-02-12 DIAGNOSIS — Z113 Encounter for screening for infections with a predominantly sexual mode of transmission: Secondary | ICD-10-CM | POA: Diagnosis present

## 2014-02-12 DIAGNOSIS — Z1371 Encounter for nonprocreative screening for genetic disease carrier status: Secondary | ICD-10-CM

## 2014-02-12 DIAGNOSIS — Z114 Encounter for screening for human immunodeficiency virus [HIV]: Secondary | ICD-10-CM

## 2014-02-12 DIAGNOSIS — O09899 Supervision of other high risk pregnancies, unspecified trimester: Secondary | ICD-10-CM | POA: Insufficient documentation

## 2014-02-12 DIAGNOSIS — Z1389 Encounter for screening for other disorder: Secondary | ICD-10-CM

## 2014-02-12 DIAGNOSIS — Z1151 Encounter for screening for human papillomavirus (HPV): Secondary | ICD-10-CM | POA: Insufficient documentation

## 2014-02-12 DIAGNOSIS — Z3401 Encounter for supervision of normal first pregnancy, first trimester: Secondary | ICD-10-CM

## 2014-02-12 DIAGNOSIS — Z124 Encounter for screening for malignant neoplasm of cervix: Secondary | ICD-10-CM

## 2014-02-12 DIAGNOSIS — F17200 Nicotine dependence, unspecified, uncomplicated: Secondary | ICD-10-CM

## 2014-02-12 DIAGNOSIS — Z01411 Encounter for gynecological examination (general) (routine) with abnormal findings: Secondary | ICD-10-CM | POA: Insufficient documentation

## 2014-02-12 DIAGNOSIS — Z0184 Encounter for antibody response examination: Secondary | ICD-10-CM

## 2014-02-12 DIAGNOSIS — Z331 Pregnant state, incidental: Secondary | ICD-10-CM

## 2014-02-12 DIAGNOSIS — Z0283 Encounter for blood-alcohol and blood-drug test: Secondary | ICD-10-CM

## 2014-02-12 DIAGNOSIS — Z3491 Encounter for supervision of normal pregnancy, unspecified, first trimester: Secondary | ICD-10-CM

## 2014-02-12 DIAGNOSIS — Z131 Encounter for screening for diabetes mellitus: Secondary | ICD-10-CM

## 2014-02-12 DIAGNOSIS — R81 Glycosuria: Secondary | ICD-10-CM

## 2014-02-12 DIAGNOSIS — Z720 Tobacco use: Secondary | ICD-10-CM

## 2014-02-12 LAB — POCT URINALYSIS DIPSTICK
Blood, UA: NEGATIVE
Glucose, UA: 1000
KETONES UA: NEGATIVE
LEUKOCYTES UA: NEGATIVE
NITRITE UA: NEGATIVE
Protein, UA: NEGATIVE

## 2014-02-12 LAB — CBC
HCT: 37.5 % (ref 36.0–46.0)
HEMOGLOBIN: 13.1 g/dL (ref 12.0–15.0)
MCH: 29.3 pg (ref 26.0–34.0)
MCHC: 34.9 g/dL (ref 30.0–36.0)
MCV: 83.9 fL (ref 78.0–100.0)
PLATELETS: 331 10*3/uL (ref 150–400)
RBC: 4.47 MIL/uL (ref 3.87–5.11)
RDW: 15.2 % (ref 11.5–15.5)
WBC: 8.8 10*3/uL (ref 4.0–10.5)

## 2014-02-12 LAB — GLUCOSE, POCT (MANUAL RESULT ENTRY): POC Glucose: 103 mg/dl — AB (ref 70–99)

## 2014-02-12 MED ORDER — DOXYLAMINE-PYRIDOXINE 10-10 MG PO TBEC: DELAYED_RELEASE_TABLET | ORAL | Status: DC

## 2014-02-12 NOTE — Patient Instructions (Signed)
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
## 2014-02-13 ENCOUNTER — Encounter: Payer: Self-pay | Admitting: Women's Health

## 2014-02-13 DIAGNOSIS — O26899 Other specified pregnancy related conditions, unspecified trimester: Secondary | ICD-10-CM

## 2014-02-13 DIAGNOSIS — Z6791 Unspecified blood type, Rh negative: Secondary | ICD-10-CM | POA: Insufficient documentation

## 2014-02-13 DIAGNOSIS — F1290 Cannabis use, unspecified, uncomplicated: Secondary | ICD-10-CM | POA: Insufficient documentation

## 2014-02-13 LAB — CYSTIC FIBROSIS DIAGNOSTIC STUDY

## 2014-02-13 LAB — DRUG SCREEN, URINE, NO CONFIRMATION
AMPHETAMINE SCRN UR: NEGATIVE
BENZODIAZEPINES.: NEGATIVE
Barbiturate Quant, Ur: NEGATIVE
COCAINE METABOLITES: NEGATIVE
Creatinine,U: 184.5 mg/dL
MARIJUANA METABOLITE: POSITIVE — AB
Methadone: NEGATIVE
Opiate Screen, Urine: NEGATIVE
Phencyclidine (PCP): NEGATIVE
Propoxyphene: NEGATIVE

## 2014-02-13 LAB — URINALYSIS, ROUTINE W REFLEX MICROSCOPIC
Bilirubin Urine: NEGATIVE
Glucose, UA: >1000 mg/dL — AB
Hgb urine dipstick: NEGATIVE
Ketones, ur: NEGATIVE mg/dL
Leukocytes, UA: NEGATIVE
NITRITE: NEGATIVE
PH: 7 (ref 5.0–8.0)
Protein, ur: NEGATIVE mg/dL
Specific Gravity, Urine: 1.03 (ref 1.005–1.030)
Urobilinogen, UA: 0.2 mg/dL (ref 0.0–1.0)

## 2014-02-13 LAB — URINALYSIS, MICROSCOPIC ONLY
Bacteria, UA: NONE SEEN
CRYSTALS: NONE SEEN
Casts: NONE SEEN
Squamous Epithelial / LPF: NONE SEEN

## 2014-02-13 LAB — VARICELLA ZOSTER ANTIBODY, IGG: VARICELLA IGG: 920.6 {index} — AB (ref <135.00)

## 2014-02-13 LAB — RPR

## 2014-02-13 LAB — HIV ANTIBODY (ROUTINE TESTING W REFLEX): HIV: NONREACTIVE

## 2014-02-13 LAB — URINE CULTURE
COLONY COUNT: NO GROWTH
ORGANISM ID, BACTERIA: NO GROWTH

## 2014-02-13 LAB — ABO AND RH: Rh Type: NEGATIVE

## 2014-02-13 LAB — HEPATITIS B SURFACE ANTIGEN: HEP B S AG: NEGATIVE

## 2014-02-13 LAB — ANTIBODY SCREEN: Antibody Screen: NEGATIVE

## 2014-02-13 LAB — OXYCODONE SCREEN, UA, RFLX CONFIRM: OXYCODONE SCRN UR: NEGATIVE ng/mL

## 2014-02-13 LAB — RUBELLA SCREEN: Rubella: 1.68 Index — ABNORMAL HIGH (ref <0.90)

## 2014-02-13 LAB — CYTOLOGY - PAP

## 2014-02-18 ENCOUNTER — Encounter: Payer: Self-pay | Admitting: Women's Health

## 2014-02-18 DIAGNOSIS — R87619 Unspecified abnormal cytological findings in specimens from cervix uteri: Secondary | ICD-10-CM | POA: Insufficient documentation

## 2014-03-12 ENCOUNTER — Encounter: Payer: Self-pay | Admitting: Women's Health

## 2014-03-12 ENCOUNTER — Ambulatory Visit (INDEPENDENT_AMBULATORY_CARE_PROVIDER_SITE_OTHER): Payer: 59 | Admitting: Women's Health

## 2014-03-12 ENCOUNTER — Ambulatory Visit (INDEPENDENT_AMBULATORY_CARE_PROVIDER_SITE_OTHER): Payer: 59

## 2014-03-12 VITALS — BP 112/64 | Wt 129.5 lb

## 2014-03-12 DIAGNOSIS — Z3682 Encounter for antenatal screening for nuchal translucency: Secondary | ICD-10-CM

## 2014-03-12 DIAGNOSIS — Z1389 Encounter for screening for other disorder: Secondary | ICD-10-CM

## 2014-03-12 DIAGNOSIS — O09291 Supervision of pregnancy with other poor reproductive or obstetric history, first trimester: Secondary | ICD-10-CM

## 2014-03-12 DIAGNOSIS — IMO0002 Reserved for concepts with insufficient information to code with codable children: Secondary | ICD-10-CM

## 2014-03-12 DIAGNOSIS — K859 Acute pancreatitis without necrosis or infection, unspecified: Secondary | ICD-10-CM

## 2014-03-12 DIAGNOSIS — Z3201 Encounter for pregnancy test, result positive: Secondary | ICD-10-CM

## 2014-03-12 DIAGNOSIS — Z331 Pregnant state, incidental: Secondary | ICD-10-CM

## 2014-03-12 DIAGNOSIS — Z3481 Encounter for supervision of other normal pregnancy, first trimester: Secondary | ICD-10-CM

## 2014-03-12 DIAGNOSIS — Z3491 Encounter for supervision of normal pregnancy, unspecified, first trimester: Secondary | ICD-10-CM

## 2014-03-12 DIAGNOSIS — Z36 Encounter for antenatal screening of mother: Secondary | ICD-10-CM

## 2014-03-12 LAB — POCT URINALYSIS DIPSTICK
Glucose, UA: NEGATIVE
Ketones, UA: NEGATIVE
LEUKOCYTES UA: NEGATIVE
NITRITE UA: NEGATIVE
Protein, UA: NEGATIVE
RBC UA: NEGATIVE

## 2014-03-12 LAB — AMYLASE: AMYLASE: 43 U/L (ref 0–105)

## 2014-03-12 LAB — LIPASE: Lipase: 12 U/L (ref 0–75)

## 2014-03-12 NOTE — Patient Instructions (Signed)
First Trimester of Pregnancy The first trimester of pregnancy is from week 1 until the end of week 12 (months 1 through 3). A week after a sperm fertilizes an egg, the egg will implant on the wall of the uterus. This embryo will begin to develop into a baby. Genes from you and your partner are forming the baby. The female genes determine whether the baby is a boy or a girl. At 6-8 weeks, the eyes and face are formed, and the heartbeat can be seen on ultrasound. At the end of 12 weeks, all the baby's organs are formed.  Now that you are pregnant, you will want to do everything you can to have a healthy baby. Two of the most important things are to get good prenatal care and to follow your health care provider's instructions. Prenatal care is all the medical care you receive before the baby's birth. This care will help prevent, find, and treat any problems during the pregnancy and childbirth. BODY CHANGES Your body goes through many changes during pregnancy. The changes vary from woman to woman.   You may gain or lose a couple of pounds at first.  You may feel sick to your stomach (nauseous) and throw up (vomit). If the vomiting is uncontrollable, call your health care provider.  You may tire easily.  You may develop headaches that can be relieved by medicines approved by your health care provider.  You may urinate more often. Painful urination may mean you have a bladder infection.  You may develop heartburn as a result of your pregnancy.  You may develop constipation because certain hormones are causing the muscles that push waste through your intestines to slow down.  You may develop hemorrhoids or swollen, bulging veins (varicose veins).  Your breasts may begin to grow larger and become tender. Your nipples may stick out more, and the tissue that surrounds them (areola) may become darker.  Your gums may bleed and may be sensitive to brushing and flossing.  Dark spots or blotches (chloasma,  mask of pregnancy) may develop on your face. This will likely fade after the baby is born.  Your menstrual periods will stop.  You may have a loss of appetite.  You may develop cravings for certain kinds of food.  You may have changes in your emotions from day to day, such as being excited to be pregnant or being concerned that something may go wrong with the pregnancy and baby.  You may have more vivid and strange dreams.  You may have changes in your hair. These can include thickening of your hair, rapid growth, and changes in texture. Some women also have hair loss during or after pregnancy, or hair that feels dry or thin. Your hair will most likely return to normal after your baby is born. WHAT TO EXPECT AT YOUR PRENATAL VISITS During a routine prenatal visit:  You will be weighed to make sure you and the baby are growing normally.  Your blood pressure will be taken.  Your abdomen will be measured to track your baby's growth.  The fetal heartbeat will be listened to starting around week 10 or 12 of your pregnancy.  Test results from any previous visits will be discussed. Your health care provider may ask you:  How you are feeling.  If you are feeling the baby move.  If you have had any abnormal symptoms, such as leaking fluid, bleeding, severe headaches, or abdominal cramping.  If you have any questions. Other tests   that may be performed during your first trimester include:  Blood tests to find your blood type and to check for the presence of any previous infections. They will also be used to check for low iron levels (anemia) and Rh antibodies. Later in the pregnancy, blood tests for diabetes will be done along with other tests if problems develop.  Urine tests to check for infections, diabetes, or protein in the urine.  An ultrasound to confirm the proper growth and development of the baby.  An amniocentesis to check for possible genetic problems.  Fetal screens for  spina bifida and Down syndrome.  You may need other tests to make sure you and the baby are doing well. HOME CARE INSTRUCTIONS  Medicines  Follow your health care provider's instructions regarding medicine use. Specific medicines may be either safe or unsafe to take during pregnancy.  Take your prenatal vitamins as directed.  If you develop constipation, try taking a stool softener if your health care provider approves. Diet  Eat regular, well-balanced meals. Choose a variety of foods, such as meat or vegetable-based protein, fish, milk and low-fat dairy products, vegetables, fruits, and whole grain breads and cereals. Your health care provider will help you determine the amount of weight gain that is right for you.  Avoid raw meat and uncooked cheese. These carry germs that can cause birth defects in the baby.  Eating four or five small meals rather than three large meals a day may help relieve nausea and vomiting. If you start to feel nauseous, eating a few soda crackers can be helpful. Drinking liquids between meals instead of during meals also seems to help nausea and vomiting.  If you develop constipation, eat more high-fiber foods, such as fresh vegetables or fruit and whole grains. Drink enough fluids to keep your urine clear or pale yellow. Activity and Exercise  Exercise only as directed by your health care provider. Exercising will help you:  Control your weight.  Stay in shape.  Be prepared for labor and delivery.  Experiencing pain or cramping in the lower abdomen or low back is a good sign that you should stop exercising. Check with your health care provider before continuing normal exercises.  Try to avoid standing for long periods of time. Move your legs often if you must stand in one place for a long time.  Avoid heavy lifting.  Wear low-heeled shoes, and practice good posture.  You may continue to have sex unless your health care provider directs you  otherwise. Relief of Pain or Discomfort  Wear a good support bra for breast tenderness.   Take warm sitz baths to soothe any pain or discomfort caused by hemorrhoids. Use hemorrhoid cream if your health care provider approves.   Rest with your legs elevated if you have leg cramps or low back pain.  If you develop varicose veins in your legs, wear support hose. Elevate your feet for 15 minutes, 3-4 times a day. Limit salt in your diet. Prenatal Care  Schedule your prenatal visits by the twelfth week of pregnancy. They are usually scheduled monthly at first, then more often in the last 2 months before delivery.  Write down your questions. Take them to your prenatal visits.  Keep all your prenatal visits as directed by your health care provider. Safety  Wear your seat belt at all times when driving.  Make a list of emergency phone numbers, including numbers for family, friends, the hospital, and police and fire departments. General Tips    Ask your health care provider for a referral to a local prenatal education class. Begin classes no later than at the beginning of month 6 of your pregnancy.  Ask for help if you have counseling or nutritional needs during pregnancy. Your health care provider can offer advice or refer you to specialists for help with various needs.  Do not use hot tubs, steam rooms, or saunas.  Do not douche or use tampons or scented sanitary pads.  Do not cross your legs for long periods of time.  Avoid cat litter boxes and soil used by cats. These carry germs that can cause birth defects in the baby and possibly loss of the fetus by miscarriage or stillbirth.  Avoid all smoking, herbs, alcohol, and medicines not prescribed by your health care provider. Chemicals in these affect the formation and growth of the baby.  Schedule a dentist appointment. At home, brush your teeth with a soft toothbrush and be gentle when you floss. SEEK MEDICAL CARE IF:   You have  dizziness.  You have mild pelvic cramps, pelvic pressure, or nagging pain in the abdominal area.  You have persistent nausea, vomiting, or diarrhea.  You have a bad smelling vaginal discharge.  You have pain with urination.  You notice increased swelling in your face, hands, legs, or ankles. SEEK IMMEDIATE MEDICAL CARE IF:   You have a fever.  You are leaking fluid from your vagina.  You have spotting or bleeding from your vagina.  You have severe abdominal cramping or pain.  You have rapid weight gain or loss.  You vomit blood or material that looks like coffee grounds.  You are exposed to German measles and have never had them.  You are exposed to fifth disease or chickenpox.  You develop a severe headache.  You have shortness of breath.  You have any kind of trauma, such as from a fall or a car accident. Document Released: 03/30/2001 Document Revised: 08/20/2013 Document Reviewed: 02/13/2013 ExitCare Patient Information 2015 [employer]. This information is not intended to replace advice given to you by your health care provider. Make sure you discuss any questions you have with your health care provider.  

## 2014-03-12 NOTE — Progress Notes (Signed)
U/S(11+2wks)-single IUP with +FCA noted, FHR- 156 bpm, CRL c/w dates, cx appears closed (3.1cm), anterior Gr 0 placenta, bilateral adnexa appears WNL, NB present, NT- 1.2333mm

## 2014-03-12 NOTE — Progress Notes (Signed)
Low-risk OB appointment G2P0100 8127w2d Estimated Date of Delivery: 09/29/14 BP 112/64 mmHg  Wt 129 lb 8 oz (58.741 kg)  LMP 12/09/2013  BP, weight, and urine reviewed.  Refer to obstetrical flow sheet for FH & FHR.  Reports good fm.  Denies regular uc's, lof, vb, or uti s/s. Starting to have some epigastric pain radiating to same spot in back x last 4-5 days, feels sort of like her pancreatitis, but has not gotten bad like a flare-up. No other sx. Diclegis is helping nausea, requests more samples- 3 samples given.  Reviewed today's normal nt u/s. To call us/go to ED if feels like she is getting pancreatitis flare-up Plan:  Continue routine obstetrical care, will check amylase, lipase today F/U in 4wks for OB appointment, colpo w/ MD, and 2nd IT 1st IT/NT today

## 2014-03-13 ENCOUNTER — Telehealth: Payer: Self-pay | Admitting: Women's Health

## 2014-03-13 ENCOUNTER — Encounter: Payer: Self-pay | Admitting: Women's Health

## 2014-03-13 NOTE — Telephone Encounter (Signed)
Notified pt of normal amylase/lipase, states she started low-fat/bland diet and is feeling better. Per [facility] she is to begin pancreatic/digestive enzymes: Pancreatin 1 w/ q meal, Super Enzymes 1 w/ heavier meals w/ more fat/protein, gave info so she can purchase and begin.  Cheral MarkerKimberly R. Nelsy Madonna, CNM, [facility] 03/13/2014 2:28 PM

## 2014-03-16 LAB — MATERNAL SCREEN, INTEGRATED #1

## 2014-04-08 ENCOUNTER — Encounter: Payer: Self-pay | Admitting: Obstetrics and Gynecology

## 2014-04-08 ENCOUNTER — Other Ambulatory Visit: Payer: Self-pay | Admitting: Obstetrics and Gynecology

## 2014-04-08 ENCOUNTER — Ambulatory Visit (INDEPENDENT_AMBULATORY_CARE_PROVIDER_SITE_OTHER): Payer: 59 | Admitting: Obstetrics and Gynecology

## 2014-04-08 VITALS — BP 118/72 | Wt 134.0 lb

## 2014-04-08 DIAGNOSIS — Z3482 Encounter for supervision of other normal pregnancy, second trimester: Secondary | ICD-10-CM

## 2014-04-08 DIAGNOSIS — Z1389 Encounter for screening for other disorder: Secondary | ICD-10-CM

## 2014-04-08 DIAGNOSIS — R87610 Atypical squamous cells of undetermined significance on cytologic smear of cervix (ASC-US): Secondary | ICD-10-CM

## 2014-04-08 DIAGNOSIS — Z369 Encounter for antenatal screening, unspecified: Secondary | ICD-10-CM

## 2014-04-08 DIAGNOSIS — D069 Carcinoma in situ of cervix, unspecified: Secondary | ICD-10-CM

## 2014-04-08 DIAGNOSIS — N871 Moderate cervical dysplasia: Secondary | ICD-10-CM

## 2014-04-08 DIAGNOSIS — Z331 Pregnant state, incidental: Secondary | ICD-10-CM

## 2014-04-08 LAB — POCT URINALYSIS DIPSTICK
Blood, UA: NEGATIVE
GLUCOSE UA: NEGATIVE
Ketones, UA: NEGATIVE
Leukocytes, UA: NEGATIVE
Nitrite, UA: NEGATIVE
Protein, UA: NEGATIVE

## 2014-04-08 NOTE — Progress Notes (Signed)
Patient ID: Amber BirkKatie C Locatelli, female   DOB: Jun 28, 1985, 28 y.o.   MRN: [id number]  Amber BirkKatie C Rota 28 y.o. [id number] here for colposcopy for ASCUS with POSITIVE high risk HPV pap smear on 02/12/2014. Discussed role for HPV in cervical dysplasia, need for surveillance.  Patient given informed consent, signed copy in the chart, time out was performed.  Placed in lithotomy position. Cervix viewed with speculum and colposcope after application of acetic acid.   Colposcopy adequate? Yes Area of low-grade abnormalities on anterior upper lip of the cervix from 11 o'clock to 2 o'clock; biopsies obtained at 1 o'clock.   ECC specimen not obtained. All specimens were labelled and sent to pathology.  Colposcopy IMPRESSION:  CIN I-II Biopsy taken at ant lip 1 oclock Patient was given post procedure instructions. Monsels lightl applied Will follow up pathology and manage accordingly.  Routine preventative health maintenance measures emphasized.  This chart was scribed for Tilda BurrowJohn Caelynn Marshman V, MD by Carl Bestelina Holson, ED Scribe. This patient was seen in Room 2 and the patient's care was started at 11:06 AM.  Also, labs drawn today

## 2014-04-08 NOTE — Progress Notes (Signed)
Pt wants to discuss starting Ambien and Prilosec now that she is in her 2nd trimester.

## 2014-04-08 NOTE — Addendum Note (Signed)
Addended by: Richardson ChiquitoRAVIS, Shariece Viveiros M on: 04/08/2014 11:37 AM   Modules accepted: Orders

## 2014-04-15 LAB — MATERNAL SCREEN, INTEGRATED #2
AFP MoM: 1.26
AFP, SERUM MAT SCREEN: 43.2 ng/mL
Age risk Down Syndrome: 1:970 {titer}
Calculated Gestational Age: 15.9
Crown Rump Length: 55.4 mm
ESTRIOL FREE MAT SCREEN: 0.71 ng/mL
Estriol Mom: 0.86
INHIBIN A DIMERIC MAT SCREEN: 369 pg/mL
Inhibin A MoM: 2.02
MSS Trisomy 18 Risk: <1:5000 {titer}
NT MOM MAT SCREEN: 1.01
NUCHAL TRANSLUCENCY MAT SCREEN 2: 1.33 mm
Number of fetuses: 1
PAPP-A MOM MAT SCREEN: 0.73
PAPP-A: 467 ng/mL
hCG MoM: 1.81
hCG, Serum: 70.3 IU/mL

## 2014-04-19 NOTE — L&D Delivery Note (Signed)
Patient is 29 y.o. G2P0100 4057w0d admitted for prelabor/SROM, hx of fetal demise with previous pregnancy  Delivery Note At 10:13 PM a viable female [name]([name]) was delivered via  (Presentation: OA).  APGAR: 8,8 ; weight 4 lb 10.4 oz (2110 g).  Dr Sherryl MangesJ Ferguson was gloved and assisted with the entirety of the delivery.  Approximately 250cc of dark red blood clots were expelled prior to delivery of baby.  After delivery of placenta, fundal massage applied and vagina inspected for lacerations.  Placenta status: partial abruption.  Cord:  3 vessel with the following complications: none.  Cord pH: pending  NICU was present for the entirety of the delivery.  Infant placed on mother's abdomen briefly.  Cord clamped and cut by FOB.   Baby was immediately transferred to the care of NICU providers.  Hospital cord blood sample collected.  Blood gas also collected and is pending.  Anesthesia: Epidural  Episiotomy: n/a   Lacerations:  none Suture Repair: n/a Est. Blood Loss (mL): 500cc   Mom to postpartum.  Baby to Couplet care / Skin to Skin.  [name], Amber Mounger M, DO 08/04/2014, 10:35 PM

## 2014-04-26 ENCOUNTER — Telehealth: Payer: Self-pay | Admitting: *Deleted

## 2014-04-26 DIAGNOSIS — Z3492 Encounter for supervision of normal pregnancy, unspecified, second trimester: Secondary | ICD-10-CM

## 2014-04-26 NOTE — Telephone Encounter (Signed)
Pt aware of results of Biopsy and Maternal screen.

## 2014-05-03 ENCOUNTER — Other Ambulatory Visit: Payer: Self-pay | Admitting: Obstetrics and Gynecology

## 2014-05-03 DIAGNOSIS — Z3689 Encounter for other specified antenatal screening: Secondary | ICD-10-CM

## 2014-05-03 DIAGNOSIS — O09292 Supervision of pregnancy with other poor reproductive or obstetric history, second trimester: Secondary | ICD-10-CM

## 2014-05-03 DIAGNOSIS — F1910 Other psychoactive substance abuse, uncomplicated: Secondary | ICD-10-CM

## 2014-05-03 DIAGNOSIS — O99320 Drug use complicating pregnancy, unspecified trimester: Secondary | ICD-10-CM

## 2014-05-06 ENCOUNTER — Ambulatory Visit (INDEPENDENT_AMBULATORY_CARE_PROVIDER_SITE_OTHER): Payer: 59

## 2014-05-06 DIAGNOSIS — O09292 Supervision of pregnancy with other poor reproductive or obstetric history, second trimester: Secondary | ICD-10-CM

## 2014-05-06 DIAGNOSIS — O99320 Drug use complicating pregnancy, unspecified trimester: Secondary | ICD-10-CM

## 2014-05-06 DIAGNOSIS — Z3689 Encounter for other specified antenatal screening: Secondary | ICD-10-CM

## 2014-05-06 DIAGNOSIS — F1910 Other psychoactive substance abuse, uncomplicated: Secondary | ICD-10-CM

## 2014-05-06 DIAGNOSIS — Z36 Encounter for antenatal screening of mother: Secondary | ICD-10-CM

## 2014-05-06 NOTE — Progress Notes (Signed)
U/S(19+1wks)-active fetus, meas c/w dates, fluid WNL, anterior Gr 0 placenta, cx appears closed (3.3cm), bilateral adnexa appears WNL, FHR- 145 bpm, no major abnl noted, female fetus

## 2014-05-08 ENCOUNTER — Ambulatory Visit (INDEPENDENT_AMBULATORY_CARE_PROVIDER_SITE_OTHER): Payer: 59 | Admitting: Advanced Practice Midwife

## 2014-05-08 ENCOUNTER — Encounter: Payer: Self-pay | Admitting: Advanced Practice Midwife

## 2014-05-08 VITALS — BP 120/72 | Wt 137.0 lb

## 2014-05-08 DIAGNOSIS — Z1389 Encounter for screening for other disorder: Secondary | ICD-10-CM

## 2014-05-08 DIAGNOSIS — Z3492 Encounter for supervision of normal pregnancy, unspecified, second trimester: Secondary | ICD-10-CM

## 2014-05-08 DIAGNOSIS — Z331 Pregnant state, incidental: Secondary | ICD-10-CM

## 2014-05-08 LAB — POCT URINALYSIS DIPSTICK
Blood, UA: NEGATIVE
Glucose, UA: 2
Ketones, UA: NEGATIVE
Leukocytes, UA: NEGATIVE
NITRITE UA: NEGATIVE
Protein, UA: NEGATIVE

## 2014-05-08 MED ORDER — ZOLPIDEM TARTRATE 5 MG PO TABS: 5.0000 mg | ORAL_TABLET | Freq: Every evening | ORAL | Status: DC | PRN

## 2014-05-08 NOTE — Progress Notes (Signed)
G2P0100 4521w3d Estimated Date of Delivery: 09/29/14  Blood pressure 120/72, weight 137 lb (62.143 kg), last menstrual period 12/09/2013.   BP weight and urine results all reviewed and noted.  Please refer to the obstetrical flow sheet for the fundal height and fetal heart rate documentation:  Patient reports good fetal movement, denies any bleeding and no rupture of membranes symptoms or regular contractions. Patient is without complaints. All questions were answered.  Plan:  Continued routine obstetrical care, requests refill on ambien  Follow up in 4 weeks for OB appointment,

## 2014-05-24 ENCOUNTER — Telehealth: Payer: Self-pay | Admitting: *Deleted

## 2014-05-24 NOTE — Telephone Encounter (Signed)
Pt c/o decrease FM, pt states she felt FM yesterday but not so much today, also c/o sharp pain from "belly button down to pelvic area." Pt encouraged to drink and eat something sweet, lay down and monitor FM, take tylenol and push water if no improvement call office back [facility]. Pt has a HX 24 wk IUFD and states that is "why she worries so much." Pt veralized understanding.

## 2014-06-05 ENCOUNTER — Ambulatory Visit (INDEPENDENT_AMBULATORY_CARE_PROVIDER_SITE_OTHER): Payer: 59 | Admitting: Women's Health

## 2014-06-05 ENCOUNTER — Encounter: Payer: Self-pay | Admitting: Women's Health

## 2014-06-05 VITALS — BP 114/68 | Wt 142.0 lb

## 2014-06-05 DIAGNOSIS — Z3492 Encounter for supervision of normal pregnancy, unspecified, second trimester: Secondary | ICD-10-CM

## 2014-06-05 DIAGNOSIS — Z1389 Encounter for screening for other disorder: Secondary | ICD-10-CM

## 2014-06-05 DIAGNOSIS — Z331 Pregnant state, incidental: Secondary | ICD-10-CM

## 2014-06-05 DIAGNOSIS — M62830 Muscle spasm of back: Secondary | ICD-10-CM

## 2014-06-05 DIAGNOSIS — F439 Reaction to severe stress, unspecified: Secondary | ICD-10-CM

## 2014-06-05 DIAGNOSIS — F1290 Cannabis use, unspecified, uncomplicated: Secondary | ICD-10-CM

## 2014-06-05 LAB — POCT URINALYSIS DIPSTICK
Blood, UA: NEGATIVE
Glucose, UA: NEGATIVE
Ketones, UA: NEGATIVE
LEUKOCYTES UA: NEGATIVE
Nitrite, UA: NEGATIVE

## 2014-06-05 MED ORDER — PROMETHAZINE HCL 25 MG PO TABS: 25.0000 mg | ORAL_TABLET | Freq: Four times a day (QID) | ORAL | Status: DC | PRN

## 2014-06-05 NOTE — Patient Instructions (Addendum)
You will have your sugar test next visit.  Please do not eat or drink anything after midnight the night before you come, not even water.  You will be here for at least two hours.     Get a lacrosse ball ([employer], Dunham's) and rub the spot in your back that hurts against the ball on a wall Heating pad- no longer than 58mns at a time Warm baths Tylenol  Call the office ([phone] or [facility]  You begin to have strong, frequent contractions  Your water breaks.  Sometimes it is a big gush of fluid, sometimes it is just a trickle that keeps getting your panties wet or running down your legs  You have vaginal bleeding.  It is normal to have a small amount of spotting if your cervix was checked.   You don't feel your baby moving like normal.  If you don't, get you something to eat and drink and lay down and focus on feeling your baby move.   If your baby is still not moving like normal, you should call the office or go to [facility]:  [phone]                [phone](usually doesn't accept new patients unless you have family there already, you are always welcome to call and ask)             [facility] ([address] [phone]  [facility]:   [place] [phone] [facility]: [phone]   Stress and Stress Management Stress is a normal reaction to life events. It is what you feel when life demands more than you are used to or more than you can handle. Some stress can be useful. For example, the stress reaction can help you catch the last bus of the day, study for a test, or meet a deadline at work. But stress that occurs too often or for too long can cause problems. It can affect your emotional health and interfere with relationships  and normal daily activities. Too much stress can weaken your immune system and increase your risk for physical illness. If you already have a medical problem, stress can make it worse. CAUSES  All sorts of life events may cause stress. An event that causes stress for one person may not be stressful for another person. Major life events commonly cause stress. These may be positive or negative. Examples include losing your job, moving into a new home, getting married, having a baby, or losing a loved one. Less obvious life events may also cause stress, especially if they occur day after day or in combination. Examples include working long hours, driving in traffic, caring for children, being in debt, or being in a difficult relationship. SIGNS AND SYMPTOMS Stress may cause emotional symptoms including, the following:  Anxiety. This is feeling worried, afraid, on edge, overwhelmed, or out of control.  Anger. This is feeling irritated or impatient.  Depression. This is feeling sad, down, helpless, or guilty.  Difficulty focusing, remembering, or making decisions. Stress may cause physical symptoms, including the following:   Aches and pains. These may affect your head, neck, back, stomach, or other areas of your body.  Tight muscles or clenched jaw.  Low energy or trouble  sleeping. Stress may cause unhealthy behaviors, including the following:   Eating to feel better (overeating) or skipping meals.  Sleeping too little, too much, or both.  Working too much or putting off tasks (procrastination).  Smoking, drinking alcohol, or using drugs to feel better. DIAGNOSIS  Stress is diagnosed through an assessment by your health care provider. Your health care provider will ask questions about your symptoms and any stressful life events.Your health care provider will also ask about your medical history and may order blood tests or other tests. Certain medical conditions and medicine can cause  physical symptoms similar to stress. Mental illness can cause emotional symptoms and unhealthy behaviors similar to stress. Your health care provider may refer you to a mental health professional for further evaluation.  TREATMENT  Stress management is the recommended treatment for stress.The goals of stress management are reducing stressful life events and coping with stress in healthy ways.  Techniques for reducing stressful life events include the following:  Stress identification. Self-monitor for stress and identify what causes stress for you. These skills may help you to avoid some stressful events.  Time management. Set your priorities, keep a calendar of events, and learn to say "no." These tools can help you avoid making too many commitments. Techniques for coping with stress include the following:  Rethinking the problem. Try to think realistically about stressful events rather than ignoring them or overreacting. Try to find the positives in a stressful situation rather than focusing on the negatives.  Exercise. Physical exercise can release both physical and emotional tension. The key is to find a form of exercise you enjoy and do it regularly.  Relaxation techniques. These relax the body and mind. Examples include yoga, meditation, tai chi, biofeedback, deep breathing, progressive muscle relaxation, listening to music, being out in nature, journaling, and other hobbies. Again, the key is to find one or more that you enjoy and can do regularly.  Healthy lifestyle. Eat a balanced diet, get plenty of sleep, and do not smoke. Avoid using alcohol or drugs to relax.  Strong support network. Spend time with family, friends, or other people you enjoy being around.Express your feelings and talk things over with someone you trust. Counseling or talktherapy with a mental health professional may be helpful if you are having difficulty managing stress on your own. Medicine is typically not  recommended for the treatment of stress.Talk to your health care provider if you think you need medicine for symptoms of stress. HOME CARE INSTRUCTIONS  Keep all follow-up visits as directed by your health care provider.  Take all medicines as directed by your health care provider. SEEK MEDICAL CARE IF:  Your symptoms get worse or you start having new symptoms.  You feel overwhelmed by your problems and can no longer manage them on your own. SEEK IMMEDIATE MEDICAL CARE IF:  You feel like hurting yourself or someone else. Document Released: 09/29/2000 Document Revised: 08/20/2013 Document Reviewed: 11/28/2012 [facility] Patient Information 2015 [place], [place]. This information is not intended to replace advice given to you by your health care provider. Make sure you discuss any questions you have with your health care provider.  Second Trimester of Pregnancy The second trimester is from week 13 through week 28, months 4 through 6. The second trimester is often a time when you feel your best. Your body has also adjusted to being pregnant, and you begin to feel better physically. Usually, morning sickness has lessened or quit completely, you may have more  energy, and you may have an increase in appetite. The second trimester is also a time when the fetus is growing rapidly. At the end of the sixth month, the fetus is about 9 inches long and weighs about 1 pounds. You will likely begin to feel the baby move (quickening) between 18 and 20 weeks of the pregnancy. BODY CHANGES Your body goes through many changes during pregnancy. The changes vary from woman to woman.   Your weight will continue to increase. You will notice your lower abdomen bulging out.  You may begin to get stretch marks on your hips, abdomen, and breasts.  You may develop headaches that can be relieved by medicines approved by your health care provider.  You may urinate more often because the fetus is pressing on your  bladder.  You may develop or continue to have heartburn as a result of your pregnancy.  You may develop constipation because certain hormones are causing the muscles that push waste through your intestines to slow down.  You may develop hemorrhoids or swollen, bulging veins (varicose veins).  You may have back pain because of the weight gain and pregnancy hormones relaxing your joints between the bones in your pelvis and as a result of a shift in weight and the muscles that support your balance.  Your breasts will continue to grow and be tender.  Your gums may bleed and may be sensitive to brushing and flossing.  Dark spots or blotches (chloasma, mask of pregnancy) may develop on your face. This will likely fade after the baby is born.  A dark line from your belly button to the pubic area (linea nigra) may appear. This will likely fade after the baby is born.  You may have changes in your hair. These can include thickening of your hair, rapid growth, and changes in texture. Some women also have hair loss during or after pregnancy, or hair that feels dry or thin. Your hair will most likely return to normal after your baby is born. WHAT TO EXPECT AT YOUR PRENATAL VISITS During a routine prenatal visit:  You will be weighed to make sure you and the fetus are growing normally.  Your blood pressure will be taken.  Your abdomen will be measured to track your baby's growth.  The fetal heartbeat will be listened to.  Any test results from the previous visit will be discussed. Your health care provider may ask you:  How you are feeling.  If you are feeling the baby move.  If you have had any abnormal symptoms, such as leaking fluid, bleeding, severe headaches, or abdominal cramping.  If you have any questions. Other tests that may be performed during your second trimester include:  Blood tests that check for:  Low iron levels (anemia).  Gestational diabetes (between 24 and 28  weeks).  Rh antibodies.  Urine tests to check for infections, diabetes, or protein in the urine.  An ultrasound to confirm the proper growth and development of the baby.  An amniocentesis to check for possible genetic problems.  Fetal screens for spina bifida and Down syndrome. HOME CARE INSTRUCTIONS   Avoid all smoking, herbs, alcohol, and unprescribed drugs. These chemicals affect the formation and growth of the baby.  Follow your health care provider's instructions regarding medicine use. There are medicines that are either safe or unsafe to take during pregnancy.  Exercise only as directed by your health care provider. Experiencing uterine cramps is a good sign to stop exercising.  Continue  to eat regular, healthy meals.  Wear a good support bra for breast tenderness.  Do not use hot tubs, steam rooms, or saunas.  Wear your seat belt at all times when driving.  Avoid raw meat, uncooked cheese, cat litter boxes, and soil used by cats. These carry germs that can cause birth defects in the baby.  Take your prenatal vitamins.  Try taking a stool softener (if your health care provider approves) if you develop constipation. Eat more high-fiber foods, such as fresh vegetables or fruit and whole grains. Drink plenty of fluids to keep your urine clear or pale yellow.  Take warm sitz baths to soothe any pain or discomfort caused by hemorrhoids. Use hemorrhoid cream if your health care provider approves.  If you develop varicose veins, wear support hose. Elevate your feet for 15 minutes, 3-4 times a day. Limit salt in your diet.  Avoid heavy lifting, wear low heel shoes, and practice good posture.  Rest with your legs elevated if you have leg cramps or low back pain.  Visit your dentist if you have not gone yet during your pregnancy. Use a soft toothbrush to brush your teeth and be gentle when you floss.  A sexual relationship may be continued unless your health care provider  directs you otherwise.  Continue to go to all your prenatal visits as directed by your health care provider. SEEK MEDICAL CARE IF:   You have dizziness.  You have mild pelvic cramps, pelvic pressure, or nagging pain in the abdominal area.  You have persistent nausea, vomiting, or diarrhea.  You have a bad smelling vaginal discharge.  You have pain with urination. SEEK IMMEDIATE MEDICAL CARE IF:   You have a fever.  You are leaking fluid from your vagina.  You have spotting or bleeding from your vagina.  You have severe abdominal cramping or pain.  You have rapid weight gain or loss.  You have shortness of breath with chest pain.  You notice sudden or extreme swelling of your face, hands, ankles, feet, or legs.  You have not felt your baby move in over an hour.  You have severe headaches that do not go away with medicine.  You have vision changes. Document Released: 03/30/2001 Document Revised: 04/10/2013 Document Reviewed: 06/06/2012 [facility] Patient Information 2015 [place], [place]. This information is not intended to replace advice given to you by your health care provider. Make sure you discuss any questions you have with your health care provider.

## 2014-06-05 NOTE — Progress Notes (Addendum)
Low-risk OB appointment [id number] Estimated Date of Delivery: 09/29/14 BP 114/68 mmHg  Wt 142 lb (64.411 kg)  LMP 12/09/2013  BP, weight, and urine reviewed.  Refer to obstetrical flow sheet for FH & FHR.  Reports good fm.  Denies regular uc's, lof, vb, or uti s/s. Rt low back pain. Requests refill on phenergan. Lots of situational stress in life right now. Has h/o anxiety/depression- not on any meds. No depression currently. Just feels very stressed at times- would like suggestions of things to do to help. Recommended stress relieving activities- going for walk, taking warm bath, reading, praying, etc. Let us know if not improving.  Rt low back: w/ tense/firm paraspinous muscle- recommended lacrosse ball against wall to work out muscle, warm baths, heating pad or ice whichever feels/works best, apap if needed Reviewed ptl s/s, fm. Recommended cb classes, info given Plan:  Continue routine obstetrical care. Will check uds today d/t h/o +THC- accidentally discarded- so will get at next visit F/U in 4wks for OB appointment and PN2

## 2014-06-05 NOTE — Addendum Note (Signed)
Addended by: Gaylyn RongEVANS, Damani Kelemen A on: 06/05/2014 11:15 AM   Modules accepted: Orders

## 2014-06-18 ENCOUNTER — Inpatient Hospital Stay (HOSPITAL_COMMUNITY)
Admission: AD | Admit: 2014-06-18 | Discharge: 2014-06-18 | Disposition: A | Discharge status: Home / Self Care | Payer: 59 | Source: Ambulatory Visit | Attending: Family Medicine | Admitting: Family Medicine

## 2014-06-18 ENCOUNTER — Encounter (HOSPITAL_COMMUNITY): Payer: Self-pay

## 2014-06-18 DIAGNOSIS — O99332 Smoking (tobacco) complicating pregnancy, second trimester: Secondary | ICD-10-CM | POA: Diagnosis not present

## 2014-06-18 DIAGNOSIS — O26899 Other specified pregnancy related conditions, unspecified trimester: Secondary | ICD-10-CM

## 2014-06-18 DIAGNOSIS — K861 Other chronic pancreatitis: Secondary | ICD-10-CM | POA: Diagnosis not present

## 2014-06-18 DIAGNOSIS — O9989 Other specified diseases and conditions complicating pregnancy, childbirth and the puerperium: Secondary | ICD-10-CM

## 2014-06-18 DIAGNOSIS — R1013 Epigastric pain: Secondary | ICD-10-CM | POA: Insufficient documentation

## 2014-06-18 DIAGNOSIS — F17210 Nicotine dependence, cigarettes, uncomplicated: Secondary | ICD-10-CM | POA: Diagnosis not present

## 2014-06-18 DIAGNOSIS — Z3A25 25 weeks gestation of pregnancy: Secondary | ICD-10-CM

## 2014-06-18 LAB — URINALYSIS, ROUTINE W REFLEX MICROSCOPIC
Bilirubin Urine: NEGATIVE
Glucose, UA: 500 mg/dL — AB
Hgb urine dipstick: NEGATIVE
Ketones, ur: NEGATIVE mg/dL
Leukocytes, UA: NEGATIVE
Nitrite: NEGATIVE
PROTEIN: NEGATIVE mg/dL
Specific Gravity, Urine: 1.025 (ref 1.005–1.030)
UROBILINOGEN UA: 0.2 mg/dL (ref 0.0–1.0)
pH: 6 (ref 5.0–8.0)

## 2014-06-18 LAB — CBC WITH DIFFERENTIAL/PLATELET
Basophils Absolute: 0 10*3/uL (ref 0.0–0.1)
Basophils Relative: 0 % (ref 0–1)
Eosinophils Absolute: 0.1 10*3/uL (ref 0.0–0.7)
Eosinophils Relative: 1 % (ref 0–5)
HEMATOCRIT: 33.3 % — AB (ref 36.0–46.0)
HEMOGLOBIN: 11.5 g/dL — AB (ref 12.0–15.0)
LYMPHS PCT: 17 % (ref 12–46)
Lymphs Abs: 1.9 10*3/uL (ref 0.7–4.0)
MCH: 31.3 pg (ref 26.0–34.0)
MCHC: 34.5 g/dL (ref 30.0–36.0)
MCV: 90.7 fL (ref 78.0–100.0)
Monocytes Absolute: 0.9 10*3/uL (ref 0.1–1.0)
Monocytes Relative: 8 % (ref 3–12)
NEUTROS ABS: 8.1 10*3/uL — AB (ref 1.7–7.7)
Neutrophils Relative %: 74 % (ref 43–77)
Platelets: 290 10*3/uL (ref 150–400)
RBC: 3.67 MIL/uL — ABNORMAL LOW (ref 3.87–5.11)
RDW: 13.8 % (ref 11.5–15.5)
WBC: 11 10*3/uL — ABNORMAL HIGH (ref 4.0–10.5)

## 2014-06-18 LAB — COMPREHENSIVE METABOLIC PANEL
ALK PHOS: 72 U/L (ref 39–117)
ALT: 14 U/L (ref 0–35)
AST: 16 U/L (ref 0–37)
Albumin: 3.2 g/dL — ABNORMAL LOW (ref 3.5–5.2)
Anion gap: 5 (ref 5–15)
BUN: 8 mg/dL (ref 6–23)
CALCIUM: 9.4 mg/dL (ref 8.4–10.5)
CO2: 24 mmol/L (ref 19–32)
Chloride: 107 mmol/L (ref 96–112)
Creatinine, Ser: 0.47 mg/dL — ABNORMAL LOW (ref 0.50–1.10)
GFR calc non Af Amer: >90 mL/min (ref >90)
GLUCOSE: 84 mg/dL (ref 70–99)
POTASSIUM: 4.3 mmol/L (ref 3.5–5.1)
SODIUM: 136 mmol/L (ref 135–145)
TOTAL PROTEIN: 6.8 g/dL (ref 6.0–8.3)
Total Bilirubin: 0.5 mg/dL (ref 0.3–1.2)

## 2014-06-18 LAB — RAPID URINE DRUG SCREEN, HOSP PERFORMED
Amphetamines: NOT DETECTED
BENZODIAZEPINES: POSITIVE — AB
Barbiturates: NOT DETECTED
COCAINE: NOT DETECTED
OPIATES: POSITIVE — AB
Tetrahydrocannabinol: NOT DETECTED

## 2014-06-18 LAB — AMYLASE: AMYLASE: 83 U/L (ref 0–105)

## 2014-06-18 LAB — LIPASE, BLOOD: LIPASE: 32 U/L (ref 11–59)

## 2014-06-18 MED ORDER — FENTANYL CITRATE 0.05 MG/ML IJ SOLN
50.0000 ug | Freq: Once | INTRAMUSCULAR | Status: AC
  Administered 2014-06-18: 50 ug via INTRAVENOUS
  Filled 2014-06-18: qty 2

## 2014-06-18 MED ORDER — HYDROMORPHONE HCL 1 MG/ML IJ SOLN
1.0000 mg | INTRAMUSCULAR | Status: DC | PRN
  Administered 2014-06-18: 1 mg via INTRAVENOUS

## 2014-06-18 MED ORDER — OMEPRAZOLE MAGNESIUM 20 MG PO TBEC: 20.0000 mg | DELAYED_RELEASE_TABLET | Freq: Every day | ORAL | Status: DC

## 2014-06-18 MED ORDER — ONDANSETRON 8 MG PO TBDP: 8.0000 mg | ORAL_TABLET | Freq: Three times a day (TID) | ORAL | Status: DC | PRN

## 2014-06-18 MED ORDER — HYDROMORPHONE HCL 1 MG/ML IJ SOLN: 1.0000 mg | INTRAMUSCULAR | Status: DC | PRN

## 2014-06-18 MED ORDER — HYDROMORPHONE HCL 1 MG/ML IJ SOLN
1.0000 mg | INTRAMUSCULAR | Status: DC | PRN
  Filled 2014-06-18: qty 1

## 2014-06-18 MED ORDER — SODIUM CHLORIDE 0.9 % IV SOLN
25.0000 mg | Freq: Once | INTRAVENOUS | Status: AC
  Administered 2014-06-18: 25 mg via INTRAVENOUS
  Filled 2014-06-18: qty 1

## 2014-06-18 MED ORDER — ONDANSETRON 4 MG PO TBDP
4.0000 mg | ORAL_TABLET | Freq: Once | ORAL | Status: AC
  Administered 2014-06-18: 4 mg via ORAL
  Filled 2014-06-18: qty 1
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
## 2014-06-18 NOTE — MAU Note (Signed)
Hx of chronic pancreatitis, has sharp upper abd pain that radiates into her back since yesterday.  Also started vomiting yesterday, denies diarrhea.  Has not checked temp @ home, thinks she has felt feverish.  Denies bleeding or LOF.

## 2014-06-18 NOTE — Discharge Instructions (Signed)
Abdominal Pain During Pregnancy °Abdominal pain is common in pregnancy. Most of the time, it does not cause harm. There are many causes of abdominal pain. Some causes are more serious than others. Some of the causes of abdominal pain in pregnancy are easily diagnosed. Occasionally, the diagnosis takes time to understand. Other times, the cause is not determined. Abdominal pain can be a sign that something is very wrong with the pregnancy, or the pain may have nothing to do with the pregnancy at all. For this reason, always tell your health care provider if you have any abdominal discomfort. °HOME CARE INSTRUCTIONS  °Monitor your abdominal pain for any changes. The following actions may help to alleviate any discomfort you are experiencing: °· Do not have sexual intercourse or put anything in your vagina until your symptoms go away completely. °· Get plenty of rest until your pain improves. °· Drink clear fluids if you feel nauseous. Avoid solid food as long as you are uncomfortable or nauseous. °· Only take over-the-counter or prescription medicine as directed by your health care provider. °· Keep all follow-up appointments with your health care provider. °SEEK IMMEDIATE MEDICAL CARE IF: °· You are bleeding, leaking fluid, or passing tissue from the vagina. °· You have increasing pain or cramping. °· You have persistent vomiting. °· You have painful or bloody urination. °· You have a fever. °· You notice a decrease in your baby's movements. °· You have extreme weakness or feel faint. °· You have shortness of breath, with or without abdominal pain. °· You develop a severe headache with abdominal pain. °· You have abnormal vaginal discharge with abdominal pain. °· You have persistent diarrhea. °· You have abdominal pain that continues even after rest, or gets worse. °MAKE SURE YOU:  °· Understand these instructions. °· Will watch your condition. °· Will get help right away if you are not doing well or get  worse. °Document Released: 04/05/2005 Document Revised: 01/24/2013 Document Reviewed: 11/02/2012 °ExitCare® Patient Information ©2015 [employer]. This information is not intended to replace advice given to you by your health care provider. Make sure you discuss any questions you have with your health care provider. ° °Heartburn During Pregnancy  °Heartburn is a burning sensation in the chest caused by stomach acid backing up into the esophagus. Heartburn is common in pregnancy because a certain hormone (progesterone) is released when a woman is pregnant. The progesterone hormone may relax the valve that separates the esophagus from the stomach. This allows acid to go up into the esophagus, causing heartburn. Heartburn may also happen in pregnancy because the enlarging uterus pushes up on the stomach, which pushes more acid into the esophagus. This is especially true in the later stages of pregnancy. Heartburn problems usually go away after giving birth. °CAUSES  °Heartburn is caused by stomach acid backing up into the esophagus. During pregnancy, this may result from various things, including:  °· The progesterone hormone. °· Changing hormone levels. °· The growing uterus pushing stomach acid upward. °· Large meals. °· Certain foods and drinks. °· Exercise. °· Increased acid production. °SIGNS AND SYMPTOMS  °· Burning pain in the chest or lower throat. °· Bitter taste in the mouth. °· Coughing. °DIAGNOSIS  °Your health care provider will typically diagnose heartburn by taking a careful history of your concern. Blood tests may be done to check for a certain type of bacteria that is associated with heartburn. Sometimes, heartburn is diagnosed by prescribing a heartburn medicine to see if the symptoms   improve. In some cases, a procedure called an endoscopy may be done. In this procedure, a tube with a light and a camera on the end (endoscope) is used to examine the esophagus and the stomach. TREATMENT  Treatment  will vary depending on the severity of your symptoms. Your health care provider may recommend:  Over-the-counter medicines (antacids, acid reducers) for mild heartburn.  Prescription medicines to decrease stomach acid or to protect your stomach lining.  Certain changes in your diet.  Elevating the head of your bed by putting blocks under the legs. This helps prevent stomach acid from backing up into the esophagus when you are lying down. HOME CARE INSTRUCTIONS   Only take over-the-counter or prescription medicines as directed by your health care provider.  Raise the head of your bed by putting blocks under the legs if instructed to do so by your health care provider. Sleeping with more pillows is not effective because it only changes the position of your head.  Do not exercise right after eating.  Avoid eating 2-3 hours before bed. Do not lie down right after eating.  Eat small meals throughout the day instead of three large meals.  Identify foods and beverages that make your symptoms worse and avoid them. Foods you may want to avoid include:  Peppers.  Chocolate.  High-fat foods, including fried foods.  Spicy foods.  Garlic and onions.  Citrus fruits, including oranges, grapefruit, lemons, and limes.  Food containing tomatoes or tomato products.  Mint.  Carbonated and caffeinated drinks.  Vinegar. SEEK MEDICAL CARE IF:  You have abdominal pain of any kind.  You feel burning in your upper abdomen or chest, especially after eating or lying down.  You have nausea and vomiting.  Your stomach feels upset after you eat. SEEK IMMEDIATE MEDICAL CARE IF:   You have severe chest pain that goes down your arm or into your jaw or neck.  You feel sweaty, dizzy, or light-headed.  You become short of breath.  You vomit blood.  You have difficulty or pain with swallowing.  You have bloody or black, tarry stools.  You have episodes of heartburn more than 3 times a  week, for more than 2 weeks. MAKE SURE YOU:  Understand these instructions.  Will watch your condition.  Will get help right away if you are not doing well or get worse. Document Released: 04/02/2000 Document Revised: 04/10/2013 Document Reviewed: 11/22/2012 [facility] Patient Information 2015 [place]. This information is not intended to replace advice given to you by your health care provider. Make sure you discuss any questions you have with your health care provider.

## 2014-06-25 ENCOUNTER — Encounter: Payer: Self-pay | Admitting: Women's Health

## 2014-06-25 DIAGNOSIS — O09299 Supervision of pregnancy with other poor reproductive or obstetric history, unspecified trimester: Secondary | ICD-10-CM | POA: Insufficient documentation

## 2014-07-03 ENCOUNTER — Other Ambulatory Visit: Payer: Self-pay | Admitting: Women's Health

## 2014-07-04 ENCOUNTER — Inpatient Hospital Stay (HOSPITAL_COMMUNITY): Payer: 59

## 2014-07-04 ENCOUNTER — Encounter: Payer: Self-pay | Admitting: Obstetrics & Gynecology

## 2014-07-04 ENCOUNTER — Other Ambulatory Visit: Payer: 59

## 2014-07-04 ENCOUNTER — Ambulatory Visit (INDEPENDENT_AMBULATORY_CARE_PROVIDER_SITE_OTHER): Payer: 59 | Admitting: Obstetrics & Gynecology

## 2014-07-04 ENCOUNTER — Inpatient Hospital Stay (HOSPITAL_COMMUNITY)
Admission: AD | Admit: 2014-07-04 | Discharge: 2014-07-07 | DRG: 781 | Disposition: A | Discharge status: Home / Self Care | Payer: 59 | Source: Ambulatory Visit | Attending: Family Medicine | Admitting: Family Medicine

## 2014-07-04 ENCOUNTER — Encounter (HOSPITAL_COMMUNITY): Payer: Self-pay | Admitting: *Deleted

## 2014-07-04 VITALS — BP 110/70 | HR 80 | Wt 154.0 lb

## 2014-07-04 DIAGNOSIS — K219 Gastro-esophageal reflux disease without esophagitis: Secondary | ICD-10-CM | POA: Diagnosis present

## 2014-07-04 DIAGNOSIS — O10012 Pre-existing essential hypertension complicating pregnancy, second trimester: Secondary | ICD-10-CM | POA: Diagnosis present

## 2014-07-04 DIAGNOSIS — O26892 Other specified pregnancy related conditions, second trimester: Secondary | ICD-10-CM | POA: Diagnosis present

## 2014-07-04 DIAGNOSIS — Z113 Encounter for screening for infections with a predominantly sexual mode of transmission: Secondary | ICD-10-CM

## 2014-07-04 DIAGNOSIS — O99612 Diseases of the digestive system complicating pregnancy, second trimester: Principal | ICD-10-CM | POA: Diagnosis present

## 2014-07-04 DIAGNOSIS — R109 Unspecified abdominal pain: Secondary | ICD-10-CM

## 2014-07-04 DIAGNOSIS — O99332 Smoking (tobacco) complicating pregnancy, second trimester: Secondary | ICD-10-CM | POA: Diagnosis present

## 2014-07-04 DIAGNOSIS — Z3492 Encounter for supervision of normal pregnancy, unspecified, second trimester: Secondary | ICD-10-CM

## 2014-07-04 DIAGNOSIS — Z8249 Family history of ischemic heart disease and other diseases of the circulatory system: Secondary | ICD-10-CM

## 2014-07-04 DIAGNOSIS — Z331 Pregnant state, incidental: Secondary | ICD-10-CM

## 2014-07-04 DIAGNOSIS — O9989 Other specified diseases and conditions complicating pregnancy, childbirth and the puerperium: Secondary | ICD-10-CM | POA: Diagnosis not present

## 2014-07-04 DIAGNOSIS — Z131 Encounter for screening for diabetes mellitus: Secondary | ICD-10-CM

## 2014-07-04 DIAGNOSIS — Z3483 Encounter for supervision of other normal pregnancy, third trimester: Secondary | ICD-10-CM

## 2014-07-04 DIAGNOSIS — Z833 Family history of diabetes mellitus: Secondary | ICD-10-CM

## 2014-07-04 DIAGNOSIS — Z1389 Encounter for screening for other disorder: Secondary | ICD-10-CM

## 2014-07-04 DIAGNOSIS — Z6791 Unspecified blood type, Rh negative: Secondary | ICD-10-CM | POA: Diagnosis not present

## 2014-07-04 DIAGNOSIS — Z3A27 27 weeks gestation of pregnancy: Secondary | ICD-10-CM | POA: Diagnosis present

## 2014-07-04 DIAGNOSIS — K861 Other chronic pancreatitis: Secondary | ICD-10-CM | POA: Diagnosis present

## 2014-07-04 DIAGNOSIS — K858 Other acute pancreatitis: Secondary | ICD-10-CM | POA: Diagnosis not present

## 2014-07-04 DIAGNOSIS — Z114 Encounter for screening for human immunodeficiency virus [HIV]: Secondary | ICD-10-CM

## 2014-07-04 DIAGNOSIS — K859 Acute pancreatitis without necrosis or infection, unspecified: Secondary | ICD-10-CM | POA: Diagnosis present

## 2014-07-04 DIAGNOSIS — O26899 Other specified pregnancy related conditions, unspecified trimester: Secondary | ICD-10-CM | POA: Insufficient documentation

## 2014-07-04 DIAGNOSIS — O09299 Supervision of pregnancy with other poor reproductive or obstetric history, unspecified trimester: Secondary | ICD-10-CM

## 2014-07-04 DIAGNOSIS — Z0184 Encounter for antibody response examination: Secondary | ICD-10-CM

## 2014-07-04 LAB — COMPREHENSIVE METABOLIC PANEL
ALT: 30 U/L (ref 0–35)
AST: 28 U/L (ref 0–37)
Albumin: 3.1 g/dL — ABNORMAL LOW (ref 3.5–5.2)
Alkaline Phosphatase: 83 U/L (ref 39–117)
Anion gap: 10 (ref 5–15)
BILIRUBIN TOTAL: 0.5 mg/dL (ref 0.3–1.2)
BUN: 6 mg/dL (ref 6–23)
CO2: 23 mmol/L (ref 19–32)
CREATININE: 0.42 mg/dL — AB (ref 0.50–1.10)
Calcium: 8.6 mg/dL (ref 8.4–10.5)
Chloride: 100 mmol/L (ref 96–112)
GFR calc Af Amer: >90 mL/min (ref >90)
GFR calc non Af Amer: >90 mL/min (ref >90)
Glucose, Bld: 97 mg/dL (ref 70–99)
Potassium: 3.7 mmol/L (ref 3.5–5.1)
Sodium: 133 mmol/L — ABNORMAL LOW (ref 135–145)
TOTAL PROTEIN: 6.8 g/dL (ref 6.0–8.3)

## 2014-07-04 LAB — CBC WITH DIFFERENTIAL/PLATELET
Basophils Absolute: 0 10*3/uL (ref 0.0–0.1)
Basophils Relative: 0 % (ref 0–1)
EOS ABS: 0.1 10*3/uL (ref 0.0–0.7)
Eosinophils Relative: 0 % (ref 0–5)
HEMATOCRIT: 32.1 % — AB (ref 36.0–46.0)
Hemoglobin: 10.8 g/dL — ABNORMAL LOW (ref 12.0–15.0)
LYMPHS ABS: 1.2 10*3/uL (ref 0.7–4.0)
Lymphocytes Relative: 9 % — ABNORMAL LOW (ref 12–46)
MCH: 30.9 pg (ref 26.0–34.0)
MCHC: 33.6 g/dL (ref 30.0–36.0)
MCV: 92 fL (ref 78.0–100.0)
MONOS PCT: 8 % (ref 3–12)
Monocytes Absolute: 1.1 10*3/uL — ABNORMAL HIGH (ref 0.1–1.0)
NEUTROS PCT: 83 % — AB (ref 43–77)
Neutro Abs: 11.3 10*3/uL — ABNORMAL HIGH (ref 1.7–7.7)
Platelets: 288 10*3/uL (ref 150–400)
RBC: 3.49 MIL/uL — AB (ref 3.87–5.11)
RDW: 13.9 % (ref 11.5–15.5)
WBC: 13.6 10*3/uL — ABNORMAL HIGH (ref 4.0–10.5)

## 2014-07-04 LAB — LIPASE, BLOOD: Lipase: 205 U/L — ABNORMAL HIGH (ref 11–59)

## 2014-07-04 LAB — URINALYSIS, ROUTINE W REFLEX MICROSCOPIC
Bilirubin Urine: NEGATIVE
GLUCOSE, UA: 250 mg/dL — AB
HGB URINE DIPSTICK: NEGATIVE
KETONES UR: 40 mg/dL — AB
LEUKOCYTES UA: NEGATIVE
Nitrite: NEGATIVE
PROTEIN: NEGATIVE mg/dL
Specific Gravity, Urine: 1.02 (ref 1.005–1.030)
UROBILINOGEN UA: 0.2 mg/dL (ref 0.0–1.0)
pH: 7.5 (ref 5.0–8.0)

## 2014-07-04 LAB — POCT URINALYSIS DIPSTICK
Glucose, UA: NEGATIVE
KETONES UA: NEGATIVE
Leukocytes, UA: NEGATIVE
Nitrite, UA: NEGATIVE
Protein, UA: NEGATIVE
RBC UA: NEGATIVE

## 2014-07-04 LAB — RAPID URINE DRUG SCREEN, HOSP PERFORMED
Amphetamines: NOT DETECTED
Barbiturates: NOT DETECTED
Benzodiazepines: POSITIVE — AB
Cocaine: NOT DETECTED
OPIATES: POSITIVE — AB
Tetrahydrocannabinol: POSITIVE — AB

## 2014-07-04 LAB — AMYLASE: AMYLASE: 280 U/L — AB (ref 0–105)

## 2014-07-04 MED ORDER — SODIUM CHLORIDE 0.9 % IJ SOLN: 9.0000 mL | INTRAMUSCULAR | Status: DC | PRN

## 2014-07-04 MED ORDER — HYDROMORPHONE HCL 1 MG/ML IJ SOLN
1.0000 mg | Freq: Once | INTRAMUSCULAR | Status: AC
  Administered 2014-07-04: 1 mg via INTRAVENOUS
  Filled 2014-07-04: qty 1

## 2014-07-04 MED ORDER — HYDROMORPHONE 0.3 MG/ML IV SOLN
INTRAVENOUS | Status: DC
  Administered 2014-07-04 – 2014-07-05 (×2): via INTRAVENOUS
  Administered 2014-07-05: 4.5 mg via INTRAVENOUS
  Administered 2014-07-05: 0.3 mg via INTRAVENOUS
  Administered 2014-07-05: 6.3 mg via INTRAVENOUS
  Administered 2014-07-05: 3.9 mg via INTRAVENOUS
  Administered 2014-07-05: 5.9 mg via INTRAVENOUS
  Administered 2014-07-05: 02:00:00 via INTRAVENOUS
  Administered 2014-07-06: 3 mg via INTRAVENOUS
  Administered 2014-07-06: 03:00:00 via INTRAVENOUS
  Administered 2014-07-06: 6.9 mg via INTRAVENOUS
  Administered 2014-07-06: 4.2 mg via INTRAVENOUS
  Filled 2014-07-04 (×5): qty 25

## 2014-07-04 MED ORDER — DIPHENHYDRAMINE HCL 12.5 MG/5ML PO ELIX
12.5000 mg | ORAL_SOLUTION | Freq: Four times a day (QID) | ORAL | Status: DC | PRN
  Filled 2014-07-04: qty 5

## 2014-07-04 MED ORDER — PRENATAL MULTIVITAMIN CH
1.0000 | ORAL_TABLET | Freq: Every day | ORAL | Status: DC
  Administered 2014-07-05 – 2014-07-06 (×2): 1 via ORAL
  Filled 2014-07-04 (×3): qty 1

## 2014-07-04 MED ORDER — LACTATED RINGERS IV BOLUS (SEPSIS)
1000.0000 mL | Freq: Once | INTRAVENOUS | Status: AC
  Administered 2014-07-04: 1000 mL via INTRAVENOUS

## 2014-07-04 MED ORDER — PROMETHAZINE HCL 25 MG/ML IJ SOLN
25.0000 mg | Freq: Once | INTRAMUSCULAR | Status: AC
  Administered 2014-07-04: 25 mg via INTRAVENOUS
  Filled 2014-07-04: qty 1

## 2014-07-04 MED ORDER — NALOXONE HCL 0.4 MG/ML IJ SOLN: 0.4000 mg | INTRAMUSCULAR | Status: DC | PRN

## 2014-07-04 MED ORDER — CALCIUM CARBONATE ANTACID 500 MG PO CHEW
2.0000 | CHEWABLE_TABLET | ORAL | Status: DC | PRN
Start: 2014-07-04 — End: 2014-07-07
  Filled 2014-07-04: qty 2

## 2014-07-04 MED ORDER — DOCUSATE SODIUM 100 MG PO CAPS
100.0000 mg | ORAL_CAPSULE | Freq: Every day | ORAL | Status: DC
  Administered 2014-07-05 – 2014-07-06 (×2): 100 mg via ORAL
  Filled 2014-07-04 (×3): qty 1

## 2014-07-04 MED ORDER — LACTATED RINGERS IV SOLN
INTRAVENOUS | Status: DC
  Administered 2014-07-04 – 2014-07-06 (×8): via INTRAVENOUS

## 2014-07-04 MED ORDER — ZOLPIDEM TARTRATE 5 MG PO TABS
5.0000 mg | ORAL_TABLET | Freq: Every evening | ORAL | Status: DC | PRN
  Administered 2014-07-04 – 2014-07-06 (×3): 5 mg via ORAL
  Filled 2014-07-04 (×3): qty 1

## 2014-07-04 MED ORDER — DIPHENHYDRAMINE HCL 50 MG/ML IJ SOLN
12.5000 mg | Freq: Four times a day (QID) | INTRAMUSCULAR | Status: DC | PRN
  Administered 2014-07-05 (×4): 12.5 mg via INTRAVENOUS
  Filled 2014-07-04 (×4): qty 1

## 2014-07-04 MED ORDER — ONDANSETRON HCL 4 MG/2ML IJ SOLN
4.0000 mg | Freq: Four times a day (QID) | INTRAMUSCULAR | Status: DC | PRN
  Administered 2014-07-05 (×2): 4 mg via INTRAVENOUS
  Filled 2014-07-04 (×2): qty 2

## 2014-07-04 MED ORDER — PROMETHAZINE HCL 25 MG/ML IJ SOLN
25.0000 mg | Freq: Four times a day (QID) | INTRAMUSCULAR | Status: DC | PRN
  Administered 2014-07-04 – 2014-07-06 (×5): 25 mg via INTRAVENOUS
  Filled 2014-07-04 (×5): qty 1

## 2014-07-04 NOTE — Progress Notes (Signed)
G2P0100 342w4d Estimated Date of Delivery: 09/29/14  Blood pressure 110/70, pulse 80, weight 154 lb (69.854 kg), last menstrual period 12/09/2013.   BP weight and urine results all reviewed and noted.  Please refer to the obstetrical flow sheet for the fundal height and fetal heart rate documentation:  Patient reports good fetal movement, denies any bleeding and no rupture of membranes symptoms or regular contractions. Patient is without complaints. All questions were answered.  Plan:  Continued routine obstetrical care,   Follow up in 3 weeks for OB appointment, Prilosec is helping

## 2014-07-04 NOTE — MAU Note (Signed)
Pain in upper abd "severe", started about an hour ago. Hx of pancreatitis. No bleeding or leaking.
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
## 2014-07-05 DIAGNOSIS — R109 Unspecified abdominal pain: Secondary | ICD-10-CM

## 2014-07-05 DIAGNOSIS — O26899 Other specified pregnancy related conditions, unspecified trimester: Secondary | ICD-10-CM | POA: Insufficient documentation

## 2014-07-05 LAB — CBC
HCT: 34.6 % (ref 34.0–46.6)
Hemoglobin: 11.7 g/dL (ref 11.1–15.9)
MCH: 30.5 pg (ref 26.6–33.0)
MCHC: 33.8 g/dL (ref 31.5–35.7)
MCV: 90 fL (ref 79–97)
Platelets: 335 10*3/uL (ref 150–379)
RBC: 3.84 x10E6/uL (ref 3.77–5.28)
RDW: 13.9 % (ref 12.3–15.4)
WBC: 13.9 10*3/uL — ABNORMAL HIGH (ref 3.4–10.8)

## 2014-07-05 LAB — GLUCOSE TOLERANCE, 2 HOURS W/ 1HR
GLUCOSE, 1 HOUR: 154 mg/dL (ref 65–179)
GLUCOSE, 2 HOUR: 99 mg/dL (ref 65–152)
Glucose, Fasting: 81 mg/dL (ref 65–91)

## 2014-07-05 LAB — HSV 2 ANTIBODY, IGG

## 2014-07-05 LAB — TYPE AND SCREEN
ABO/RH(D): A NEG
Antibody Screen: NEGATIVE

## 2014-07-05 LAB — ABO/RH: ABO/RH(D): A NEG

## 2014-07-05 LAB — ANTIBODY SCREEN: Antibody Screen: NEGATIVE

## 2014-07-05 LAB — RPR: RPR Ser Ql: NONREACTIVE

## 2014-07-05 LAB — HIV ANTIBODY (ROUTINE TESTING W REFLEX): HIV Screen 4th Generation wRfx: NONREACTIVE

## 2014-07-05 NOTE — Progress Notes (Signed)
Patient ID: [name], female   DOB: 01/06/1986, 29 y.o.   MRN: [id number] [facility] PRACTICE ANTEPARTUM(COMPREHENSIVE) NOTE  [name] is a 29 y.o. G2P0100 at 6417w5d by best clinical estimate who is admitted for acute pancreatitis.   Fetal presentation is cephalic. Length of Stay:  1  Days  Subjective: Feels slight improvement, feels hungry, getting scheduled anti-emetics, no emesis. Patient reports the fetal movement as decreased . Patient reports uterine contraction  activity as none. Patient reports  vaginal bleeding as none. Patient describes fluid per vagina as None.  Vitals:  Blood pressure 115/63, pulse 81, temperature 98 F (36.7 C), temperature source Oral, resp. rate 18, last menstrual period 12/09/2013, SpO2 100 %. Physical Examination:  General appearance - alert, well appearing, and in no distress Abdomen - gravid, upper abdominal tenderness Fundal Height:  size equals dates Extremities: extremities normal, atraumatic, no cyanosis or edema  Membranes:intact  Fetal Monitoring:  Baseline: 145 bpm, Variability: Good {> 6 bpm), Accelerations: Non-reactive but appropriate for gestational age and Decelerations: Absent  Labs:  Results for orders placed or performed during the hospital encounter of 07/04/14 (from the past 24 hour(s))  Urinalysis, Routine w reflex microscopic   Collection Time: 07/04/14  4:20 PM  Result Value Ref Range   Color, Urine YELLOW YELLOW   APPearance CLEAR CLEAR   Specific Gravity, Urine 1.020 1.005 - 1.030   pH 7.5 5.0 - 8.0   Glucose, UA 250 (A) NEGATIVE mg/dL   Hgb urine dipstick NEGATIVE NEGATIVE   Bilirubin Urine NEGATIVE NEGATIVE   Ketones, ur 40 (A) NEGATIVE mg/dL   Protein, ur NEGATIVE NEGATIVE mg/dL   Urobilinogen, UA 0.2 0.0 - 1.0 mg/dL   Nitrite NEGATIVE NEGATIVE   Leukocytes, UA NEGATIVE NEGATIVE  CBC with Differential/Platelet   Collection Time: 07/04/14  4:23 PM  Result Value Ref Range   WBC 13.6 (H) 4.0 - 10.5 K/uL   RBC 3.49 (L) 3.87 - 5.11 MIL/uL   Hemoglobin 10.8 (L) 12.0 - 15.0 g/dL   HCT 13.232.1 (L) 44.036.0 - 10.246.0 %   MCV 92.0 78.0 - 100.0 fL   MCH 30.9 26.0 - 34.0 pg   MCHC 33.6 30.0 - 36.0 g/dL   RDW 72.513.9 36.611.5 - 44.015.5 %   Platelets 288 150 - 400 K/uL   Neutrophils Relative % 83 (H) 43 - 77 %   Neutro Abs 11.3 (H) 1.7 - 7.7 K/uL   Lymphocytes Relative 9 (L) 12 - 46 %   Lymphs Abs 1.2 0.7 - 4.0 K/uL   Monocytes Relative 8 3 - 12 %   Monocytes Absolute 1.1 (H) 0.1 - 1.0 K/uL   Eosinophils Relative 0 0 - 5 %   Eosinophils Absolute 0.1 0.0 - 0.7 K/uL   Basophils Relative 0 0 - 1 %   Basophils Absolute 0.0 0.0 - 0.1 K/uL  Amylase   Collection Time: 07/04/14  4:23 PM  Result Value Ref Range   Amylase 280 (H) 0 - 105 U/L  Lipase, blood   Collection Time: 07/04/14  4:23 PM  Result Value Ref Range   Lipase 205 (H) 11 - 59 U/L  Comprehensive metabolic panel   Collection Time: 07/04/14  4:23 PM  Result Value Ref Range   Sodium 133 (L) 135 - 145 mmol/L   Potassium 3.7 3.5 - 5.1 mmol/L   Chloride 100 96 - 112 mmol/L   CO2 23 19 - 32 mmol/L   Glucose, Bld 97 70 - 99 mg/dL   BUN  6 6 - 23 mg/dL   Creatinine, Ser 4.09 (L) 0.50 - 1.10 mg/dL   Calcium 8.6 8.4 - 81.1 mg/dL   Total Protein 6.8 6.0 - 8.3 g/dL   Albumin 3.1 (L) 3.5 - 5.2 g/dL   AST 28 0 - 37 U/L   ALT 30 0 - 35 U/L   Alkaline Phosphatase 83 39 - 117 U/L   Total Bilirubin 0.5 0.3 - 1.2 mg/dL   GFR calc non Af Amer >90 >90 mL/min   GFR calc Af Amer >90 >90 mL/min   Anion gap 10 5 - 15  Urine rapid drug screen (hosp performed)   Collection Time: 07/04/14  4:35 PM  Result Value Ref Range   Opiates POSITIVE (A) NONE DETECTED   Cocaine NONE DETECTED NONE DETECTED   Benzodiazepines POSITIVE (A) NONE DETECTED   Amphetamines NONE DETECTED NONE DETECTED   Tetrahydrocannabinol POSITIVE (A) NONE DETECTED   Barbiturates NONE DETECTED NONE DETECTED  Results for orders placed or performed in visit on 07/04/14 (from the past 24 hour(s))  POCT  urinalysis dipstick   Collection Time: 07/04/14 10:33 AM  Result Value Ref Range   Color, UA     Clarity, UA     Glucose, UA neg    Bilirubin, UA     Ketones, UA neg    Spec Grav, UA     Blood, UA neg    pH, UA     Protein, UA neg    Urobilinogen, UA     Nitrite, UA neg    Leukocytes, UA Negative   Results for orders placed or performed in visit on 07/04/14 (from the past 24 hour(s))  CBC   Collection Time: 07/04/14  8:55 AM  Result Value Ref Range   WBC 13.9 (H) 3.4 - 10.8 x10E3/uL   RBC 3.84 3.77 - 5.28 x10E6/uL   Hemoglobin 11.7 11.1 - 15.9 g/dL   HCT 91.4 78.2 - 95.6 %   MCV 90 79 - 97 fL   MCH 30.5 26.6 - 33.0 pg   MCHC 33.8 31.5 - 35.7 g/dL   RDW 21.3 08.6 - 57.8 %   Platelets 335 150 - 379 x10E3/uL  RPR   Collection Time: 07/04/14  8:55 AM  Result Value Ref Range   RPR Ser Ql Non Reactive Non Reactive  Glucose Tolerance, 2 Hours w/1 Hour   Collection Time: 07/04/14  8:55 AM  Result Value Ref Range   Glucose, Fasting 81 65 - 91 mg/dL   Glucose, 1 hour 469 65 - 179 mg/dL   Glucose, 2 hour 99 65 - 152 mg/dL  HSV 2 antibody, IgG   Collection Time: 07/04/14  8:55 AM  Result Value Ref Range   HSV 2 Glycoprotein G Ab, IgG WILL FOLLOW   Antibody screen   Collection Time: 07/04/14  8:55 AM  Result Value Ref Range   Antibody Screen Negative Negative    Imaging Studies:    FINDINGS: Gallbladder: No gallstones or wall thickening visualized. No sonographic Murphy sign noted.  Common bile duct: Diameter: 3.1 mm which is within normal limits.  Liver: No focal lesion identified. Within normal limits in parenchymal echogenicity.  IVC: No abnormality visualized.  Pancreas: Visualized portion unremarkable.  Spleen: Size and appearance within normal limits.  Right Kidney: Length: 12.9 cm. Echogenicity within normal limits. No mass or hydronephrosis visualized.  Left Kidney: Length: 11 cm. Echogenicity within normal limits. No mass or hydronephrosis  visualized.  Abdominal aorta: No aneurysm visualized.  Other  findings: None.  IMPRESSION: No significant abnormality seen in the abdomen.  Medications:  Scheduled . docusate sodium  100 mg Oral Daily  . HYDROmorphone PCA 0.3 mg/mL   Intravenous 6 times per day  . prenatal multivitamin  1 tablet Oral Q1200   I have reviewed the patient's current medications.  ASSESSMENT: Principal Problem:   Acute pancreatitis Active Problems:   Rh negative state in antepartum period   Prior pregnancy with fetal demise, antepartum   Chronic relapsing pancreatitis   PLAN: Will attempt clears today. Continue PCA for now  Reva Bores, MD 07/05/2014,7:57 AM

## 2014-07-06 LAB — CBC
HEMATOCRIT: 29.3 % — AB (ref 36.0–46.0)
HEMOGLOBIN: 9.5 g/dL — AB (ref 12.0–15.0)
MCH: 30.3 pg (ref 26.0–34.0)
MCHC: 32.4 g/dL (ref 30.0–36.0)
MCV: 93.3 fL (ref 78.0–100.0)
Platelets: 259 10*3/uL (ref 150–400)
RBC: 3.14 MIL/uL — ABNORMAL LOW (ref 3.87–5.11)
RDW: 14.2 % (ref 11.5–15.5)
WBC: 7.2 10*3/uL (ref 4.0–10.5)

## 2014-07-06 LAB — AMYLASE: Amylase: 139 U/L — ABNORMAL HIGH (ref 0–105)

## 2014-07-06 LAB — LIPASE, BLOOD: LIPASE: 68 U/L — AB (ref 11–59)

## 2014-07-06 MED ORDER — OXYCODONE HCL ER 20 MG PO T12A
20.0000 mg | EXTENDED_RELEASE_TABLET | Freq: Two times a day (BID) | ORAL | Status: DC
  Administered 2014-07-06 – 2014-07-07 (×3): 20 mg via ORAL
  Filled 2014-07-06 (×3): qty 1

## 2014-07-06 MED ORDER — OXYCODONE-ACETAMINOPHEN 5-325 MG PO TABS
1.0000 | ORAL_TABLET | Freq: Four times a day (QID) | ORAL | Status: DC | PRN
  Administered 2014-07-06: 1 via ORAL
  Filled 2014-07-06: qty 1

## 2014-07-06 MED ORDER — OXYCODONE-ACETAMINOPHEN 5-325 MG PO TABS
1.0000 | ORAL_TABLET | ORAL | Status: DC | PRN
  Administered 2014-07-06 – 2014-07-07 (×5): 1 via ORAL
  Filled 2014-07-06 (×5): qty 1

## 2014-07-06 NOTE — Plan of Care (Signed)
Problem: Consults Goal: Birthing Suites Patient Information Press F2 to bring up selections list Outcome: Completed/Met Date Met:  07/06/14  Pt < [redacted] weeks EGA

## 2014-07-06 NOTE — Plan of Care (Signed)
Problem: Consults Goal: Birthing Suites Patient Information Press F2 to bring up selections list   Pt < [redacted] weeks EGA     

## 2014-07-06 NOTE — Progress Notes (Signed)
FACULTY PRACTICE ANTEPARTUM(COMPREHENSIVE) NOTE  [name] is a 29 y.o. G2P0100 at [redacted]w[redacted]d  who is admitted for chronic and acute pancreatitis.   Fetal presentation is unsure. Length of Stay:  2  Days  Subjective: Patient pain is improving, now rated as a 4/10, Pt using PCA less, is ready to d/c PCA. Pt historically was on Oxycodone10 q6h, and previoiusly was on Oxycontin 20 q12 h, with breakthru Percocet for acute exacerbations. Patient reports the fetal movement as active. Patient reports uterine contraction  activity as none. Patient reports  vaginal bleeding as none. Patient describes fluid per vagina as None.  Vitals:  Blood pressure 119/72, pulse 95, temperature 98.3 F (36.8 C), temperature source Axillary, resp. rate 16, last menstrual period 12/09/2013, SpO2 99 %. Physical Examination:  General appearance - alert, well appearing, and in no distress and well hydrated Heart - normal rate and regular rhythm Abdomen - soft, nontender, nondistended Fundal Height:  size equals dates Cervical Exam: Not evaluated.  Extremities: extremities normal, atraumatic, no cyanosis or edema and Homans sign is negative, no sign of DVT with DTRs 2+ bilaterally Membranes:intact  Fetal Monitoring:  NST q shift  Labs:  Results for orders placed or performed during the hospital encounter of 07/04/14 (from the past 24 hour(s))  Type and screen   Collection Time: 07/05/14  1:38 PM  Result Value Ref Range   ABO/RH(D) A NEG    Antibody Screen NEG    Sample Expiration 07/08/2014   ABO/Rh   Collection Time: 07/05/14  1:38 PM  Result Value Ref Range   ABO/RH(D) A NEG   CBC   Collection Time: 07/06/14  5:07 AM  Result Value Ref Range   WBC 7.2 4.0 - 10.5 K/uL   RBC 3.14 (L) 3.87 - 5.11 MIL/uL   Hemoglobin 9.5 (L) 12.0 - 15.0 g/dL   HCT 16.1 (L) 09.6 - 04.5 %   MCV 93.3 78.0 - 100.0 fL   MCH 30.3 26.0 - 34.0 pg   MCHC 32.4 30.0 - 36.0 g/dL   RDW 40.9 81.1 - 91.4 %   Platelets 259 150 - 400  K/uL    Imaging Studies:     Currently EPIC will not allow sonographic studies to automatically populate into notes.  In the meantime, copy and paste results into note or free text.  Medications:  Scheduled . docusate sodium  100 mg Oral Daily  . OxyCODONE  20 mg Oral Q12H  . prenatal multivitamin  1 tablet Oral Q1200   I have reviewed the patient's current medications.  ASSESSMENT: Patient Active Problem List   Diagnosis Date Noted  . Abnormal Pap smear of cervix 02/18/2014    Priority: Medium  . Abdominal pain affecting pregnancy, antepartum   . [redacted] weeks gestation of pregnancy   . Chronic relapsing pancreatitis 07/04/2014  . Prior pregnancy with fetal demise, antepartum 06/25/2014  . Pap smear abnormality of cervix with ASCUS favoring dysplasia 04/08/2014  . Rh negative state in antepartum period 02/13/2014  . Marijuana use 02/13/2014  . Supervision of normal pregnancy 02/12/2014  . Malnutrition of moderate degree 11/18/2012  . Hematemesis 11/04/2012  . Acute pancreatitis 11/03/2012  . Sleep disturbance 08/01/2012  . Generalized anxiety disorder 08/01/2012  . Opiate dependence 02/27/2012  . Opiate withdrawal 04/13/2011  . Tobacco abuse 04/13/2011  . Pancreas divisum 07/23/2010  . Anemia 07/23/2010    PLAN: 1. D/c PCA 2 Switch to oxycontin 20 q 12,with percocet for breakthru 3. Keep pt on bland  diet today 4 followup todays labs, still pending 5 Probable outpatient this weekend.  [name] V 07/06/2014,6:50 AM

## 2014-07-06 NOTE — Progress Notes (Signed)
Pt c/o left labial swelling & soreness.  Labial assessed, swelling noted on left side.  Pt given ice pack to apply to vagina to help with swelling.

## 2014-07-07 DIAGNOSIS — Z3A27 27 weeks gestation of pregnancy: Secondary | ICD-10-CM

## 2014-07-07 DIAGNOSIS — R109 Unspecified abdominal pain: Secondary | ICD-10-CM

## 2014-07-07 DIAGNOSIS — O9989 Other specified diseases and conditions complicating pregnancy, childbirth and the puerperium: Secondary | ICD-10-CM

## 2014-07-07 DIAGNOSIS — K858 Other acute pancreatitis: Secondary | ICD-10-CM

## 2014-07-07 LAB — AMYLASE: Amylase: 96 U/L (ref 0–105)

## 2014-07-07 LAB — LIPASE, BLOOD: Lipase: 56 U/L (ref 11–59)

## 2014-07-07 MED ORDER — OXYCODONE-ACETAMINOPHEN 5-325 MG PO TABS
1.0000 | ORAL_TABLET | ORAL | Status: DC | PRN
Start: 2014-07-07 — End: 2014-08-02

## 2014-07-07 NOTE — Discharge Instructions (Signed)
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
## 2014-07-24 ENCOUNTER — Encounter: Payer: Self-pay | Admitting: Women's Health

## 2014-07-24 ENCOUNTER — Encounter (HOSPITAL_COMMUNITY): Payer: Self-pay | Admitting: *Deleted

## 2014-07-24 ENCOUNTER — Other Ambulatory Visit: Payer: Self-pay | Admitting: Women's Health

## 2014-07-24 ENCOUNTER — Inpatient Hospital Stay (HOSPITAL_COMMUNITY)
Admission: AD | Admit: 2014-07-24 | Discharge: 2014-07-27 | DRG: 781 | Disposition: A | Discharge status: Home / Self Care | Payer: 59 | Source: Ambulatory Visit | Attending: Obstetrics and Gynecology | Admitting: Obstetrics and Gynecology

## 2014-07-24 ENCOUNTER — Ambulatory Visit (INDEPENDENT_AMBULATORY_CARE_PROVIDER_SITE_OTHER): Payer: 59 | Admitting: Women's Health

## 2014-07-24 VITALS — BP 102/68 | HR 116 | Wt 150.0 lb

## 2014-07-24 DIAGNOSIS — R81 Glycosuria: Secondary | ICD-10-CM

## 2014-07-24 DIAGNOSIS — F1190 Opioid use, unspecified, uncomplicated: Secondary | ICD-10-CM | POA: Diagnosis present

## 2014-07-24 DIAGNOSIS — F17210 Nicotine dependence, cigarettes, uncomplicated: Secondary | ICD-10-CM | POA: Diagnosis present

## 2014-07-24 DIAGNOSIS — B9689 Other specified bacterial agents as the cause of diseases classified elsewhere: Secondary | ICD-10-CM

## 2014-07-24 DIAGNOSIS — O10013 Pre-existing essential hypertension complicating pregnancy, third trimester: Secondary | ICD-10-CM | POA: Diagnosis present

## 2014-07-24 DIAGNOSIS — O99333 Smoking (tobacco) complicating pregnancy, third trimester: Secondary | ICD-10-CM | POA: Diagnosis present

## 2014-07-24 DIAGNOSIS — N760 Acute vaginitis: Secondary | ICD-10-CM

## 2014-07-24 DIAGNOSIS — K861 Other chronic pancreatitis: Secondary | ICD-10-CM | POA: Diagnosis present

## 2014-07-24 DIAGNOSIS — Z833 Family history of diabetes mellitus: Secondary | ICD-10-CM | POA: Diagnosis not present

## 2014-07-24 DIAGNOSIS — Z331 Pregnant state, incidental: Secondary | ICD-10-CM

## 2014-07-24 DIAGNOSIS — O99323 Drug use complicating pregnancy, third trimester: Secondary | ICD-10-CM | POA: Diagnosis present

## 2014-07-24 DIAGNOSIS — Z131 Encounter for screening for diabetes mellitus: Secondary | ICD-10-CM

## 2014-07-24 DIAGNOSIS — O23593 Infection of other part of genital tract in pregnancy, third trimester: Principal | ICD-10-CM | POA: Diagnosis present

## 2014-07-24 DIAGNOSIS — O4703 False labor before 37 completed weeks of gestation, third trimester: Secondary | ICD-10-CM

## 2014-07-24 DIAGNOSIS — K219 Gastro-esophageal reflux disease without esophagitis: Secondary | ICD-10-CM | POA: Diagnosis present

## 2014-07-24 DIAGNOSIS — Z1389 Encounter for screening for other disorder: Secondary | ICD-10-CM

## 2014-07-24 DIAGNOSIS — Z3A30 30 weeks gestation of pregnancy: Secondary | ICD-10-CM | POA: Diagnosis present

## 2014-07-24 DIAGNOSIS — Z8249 Family history of ischemic heart disease and other diseases of the circulatory system: Secondary | ICD-10-CM

## 2014-07-24 DIAGNOSIS — O99613 Diseases of the digestive system complicating pregnancy, third trimester: Secondary | ICD-10-CM | POA: Diagnosis present

## 2014-07-24 DIAGNOSIS — Z3493 Encounter for supervision of normal pregnancy, unspecified, third trimester: Secondary | ICD-10-CM

## 2014-07-24 DIAGNOSIS — F1210 Cannabis abuse, uncomplicated: Secondary | ICD-10-CM

## 2014-07-24 DIAGNOSIS — O09893 Supervision of other high risk pregnancies, third trimester: Secondary | ICD-10-CM

## 2014-07-24 DIAGNOSIS — F1290 Cannabis use, unspecified, uncomplicated: Secondary | ICD-10-CM

## 2014-07-24 DIAGNOSIS — Z825 Family history of asthma and other chronic lower respiratory diseases: Secondary | ICD-10-CM

## 2014-07-24 DIAGNOSIS — A499 Bacterial infection, unspecified: Secondary | ICD-10-CM

## 2014-07-24 LAB — CBC
HCT: 32.4 % — ABNORMAL LOW (ref 36.0–46.0)
HEMOGLOBIN: 11.1 g/dL — AB (ref 12.0–15.0)
MCH: 30.4 pg (ref 26.0–34.0)
MCHC: 34.3 g/dL (ref 30.0–36.0)
MCV: 88.8 fL (ref 78.0–100.0)
PLATELETS: 358 10*3/uL (ref 150–400)
RBC: 3.65 MIL/uL — AB (ref 3.87–5.11)
RDW: 14 % (ref 11.5–15.5)
WBC: 12.9 10*3/uL — ABNORMAL HIGH (ref 4.0–10.5)

## 2014-07-24 LAB — POCT URINALYSIS DIPSTICK
Blood, UA: NEGATIVE
Nitrite, UA: NEGATIVE

## 2014-07-24 LAB — TYPE AND SCREEN
ABO/RH(D): A NEG
Antibody Screen: NEGATIVE

## 2014-07-24 LAB — POCT WET PREP (WET MOUNT): CLUE CELLS WET PREP WHIFF POC: POSITIVE

## 2014-07-24 LAB — OB RESULTS CONSOLE GC/CHLAMYDIA: GC PROBE AMP, GENITAL: NEGATIVE

## 2014-07-24 LAB — RAPID URINE DRUG SCREEN, HOSP PERFORMED
AMPHETAMINES: NOT DETECTED
BENZODIAZEPINES: NOT DETECTED
Barbiturates: NOT DETECTED
Cocaine: NOT DETECTED
Opiates: NOT DETECTED
TETRAHYDROCANNABINOL: NOT DETECTED

## 2014-07-24 LAB — GROUP B STREP BY PCR: Group B strep by PCR: POSITIVE — AB

## 2014-07-24 LAB — OB RESULTS CONSOLE GBS: GBS: POSITIVE

## 2014-07-24 LAB — AMYLASE: Amylase: 132 U/L — ABNORMAL HIGH (ref 0–105)

## 2014-07-24 LAB — GLUCOSE, POCT (MANUAL RESULT ENTRY): POC Glucose: 89 mg/dl (ref 70–99)

## 2014-07-24 LAB — LIPASE, BLOOD: LIPASE: 85 U/L — AB (ref 11–59)

## 2014-07-24 MED ORDER — LACTATED RINGERS IV BOLUS (SEPSIS)
1000.0000 mL | Freq: Once | INTRAVENOUS | Status: AC
  Administered 2014-07-24: 1000 mL via INTRAVENOUS

## 2014-07-24 MED ORDER — MAGNESIUM SULFATE BOLUS VIA INFUSION
4.0000 g | Freq: Once | INTRAVENOUS | Status: AC
  Administered 2014-07-24: 4 g via INTRAVENOUS
  Filled 2014-07-24: qty 500

## 2014-07-24 MED ORDER — LIDOCAINE HCL (PF) 1 % IJ SOLN: 30.0000 mL | INTRAMUSCULAR | Status: DC | PRN

## 2014-07-24 MED ORDER — OXYCODONE-ACETAMINOPHEN 5-325 MG PO TABS
2.0000 | ORAL_TABLET | ORAL | Status: DC | PRN
  Filled 2014-07-24: qty 2

## 2014-07-24 MED ORDER — LACTATED RINGERS IV SOLN
INTRAVENOUS | Status: DC
  Administered 2014-07-25 – 2014-07-26 (×3): via INTRAVENOUS

## 2014-07-24 MED ORDER — BETAMETHASONE SOD PHOS & ACET 6 (3-3) MG/ML IJ SUSP
12.0000 mg | INTRAMUSCULAR | Status: AC
  Administered 2014-07-24 – 2014-07-25 (×2): 12 mg via INTRAMUSCULAR
  Filled 2014-07-24 (×2): qty 2

## 2014-07-24 MED ORDER — METRONIDAZOLE 500 MG PO TABS
500.0000 mg | ORAL_TABLET | Freq: Two times a day (BID) | ORAL | Status: DC
  Administered 2014-07-24 – 2014-07-27 (×6): 500 mg via ORAL
  Filled 2014-07-24 (×6): qty 1

## 2014-07-24 MED ORDER — LACTATED RINGERS IV SOLN: 500.0000 mL | INTRAVENOUS | Status: DC | PRN

## 2014-07-24 MED ORDER — ACETAMINOPHEN 325 MG PO TABS: 650.0000 mg | ORAL_TABLET | ORAL | Status: DC | PRN

## 2014-07-24 MED ORDER — ONDANSETRON HCL 4 MG/2ML IJ SOLN: 4.0000 mg | Freq: Four times a day (QID) | INTRAMUSCULAR | Status: DC | PRN

## 2014-07-24 MED ORDER — OXYTOCIN 40 UNITS IN LACTATED RINGERS INFUSION - SIMPLE MED: 62.5000 mL/h | INTRAVENOUS | Status: DC

## 2014-07-24 MED ORDER — ZOLPIDEM TARTRATE 5 MG PO TABS
5.0000 mg | ORAL_TABLET | Freq: Every evening | ORAL | Status: DC | PRN
  Administered 2014-07-24 – 2014-07-26 (×3): 5 mg via ORAL
  Filled 2014-07-24 (×3): qty 1

## 2014-07-24 MED ORDER — PRENATAL MULTIVITAMIN CH
1.0000 | ORAL_TABLET | Freq: Every day | ORAL | Status: DC
  Administered 2014-07-25 – 2014-07-26 (×2): 1 via ORAL
  Filled 2014-07-24 (×3): qty 1

## 2014-07-24 MED ORDER — DOCUSATE SODIUM 100 MG PO CAPS
100.0000 mg | ORAL_CAPSULE | Freq: Every day | ORAL | Status: DC
  Filled 2014-07-24 (×2): qty 1

## 2014-07-24 MED ORDER — OXYCODONE-ACETAMINOPHEN 5-325 MG PO TABS: 1.0000 | ORAL_TABLET | ORAL | Status: DC | PRN

## 2014-07-24 MED ORDER — OXYCODONE-ACETAMINOPHEN 5-325 MG PO TABS
2.0000 | ORAL_TABLET | Freq: Once | ORAL | Status: AC
Start: 2014-07-25 — End: 2014-07-25
  Administered 2014-07-25: 2 via ORAL
  Filled 2014-07-24: qty 2

## 2014-07-24 MED ORDER — CITRIC ACID-SODIUM CITRATE 334-500 MG/5ML PO SOLN: 30.0000 mL | ORAL | Status: DC | PRN

## 2014-07-24 MED ORDER — FENTANYL CITRATE 0.05 MG/ML IJ SOLN
100.0000 ug | INTRAMUSCULAR | Status: DC | PRN
  Administered 2014-07-24 (×2): 100 ug via INTRAVENOUS
  Filled 2014-07-24 (×3): qty 2

## 2014-07-24 MED ORDER — MAGNESIUM SULFATE 40 G IN LACTATED RINGERS - SIMPLE
2.0000 g/h | INTRAVENOUS | Status: AC
  Administered 2014-07-25: 2 g/h via INTRAVENOUS
  Filled 2014-07-24 (×2): qty 500

## 2014-07-24 MED ORDER — OXYTOCIN BOLUS FROM INFUSION
500.0000 mL | INTRAVENOUS | Status: DC
  Administered 2014-07-26: 500 mL via INTRAVENOUS

## 2014-07-24 MED ORDER — CALCIUM CARBONATE ANTACID 500 MG PO CHEW
2.0000 | CHEWABLE_TABLET | ORAL | Status: DC | PRN
  Filled 2014-07-24: qty 2

## 2014-07-24 NOTE — MAU Note (Signed)
Sent from office, dilated, with ? Discharge.  Sent for further eval.
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
## 2014-07-24 NOTE — Progress Notes (Signed)
High Risk Pregnancy Diagnosis(es): preterm labor G2P0100 86102w3d Estimated Date of Delivery: 09/29/14 BP 102/68 mmHg  Pulse 116  Wt 150 lb (68.04 kg)  LMP 12/09/2013  Urinalysis: Positive for trace protein, 1+ glucose, tr leuk HPI:  Lots of pelvic pressure, cramping, mucousy d/c x 1 day BP, weight, and urine reviewed.  Reports good fm. Denies lof, vb, uti s/s.   Fundal Height:  31 Fetal Heart rate:  145 Edema:  Trace Spec exam: cx visually 1cm, fFN and wet prep collected Mod amount thin frothy white malodorous d/c, wet prep+clues=BV SVE: 3/50/-3, underterminable presentation  All questions were answered Assessment: 73102w3d ptl Medication(s) Plans:  n/a Treatment Plan:  To [facility] for evaluation, notified dr. Loreta AveAcosta- will need tx for BV Follow up in 1wk for high-risk OB appt if d/c'd from hosp

## 2014-07-24 NOTE — MAU Note (Signed)
Pt states she started feeling abdominal pain last night and pt states contractions are coming q15-4120min

## 2014-07-25 LAB — RPR: RPR Ser Ql: NONREACTIVE

## 2014-07-25 LAB — DRUG SCREEN, URINE
Amphetamines, Urine: NEGATIVE ng/mL
BARBITURATE SCREEN URINE: NEGATIVE ng/mL
BENZODIAZEPINE QUANT UR: NEGATIVE ng/mL
COCAINE (METAB.): NEGATIVE ng/mL
Cannabinoid Quant, Ur: POSITIVE ng/mL
Opiate Quant, Ur: NEGATIVE ng/mL
PCP Quant, Ur: NEGATIVE ng/mL

## 2014-07-25 LAB — GC/CHLAMYDIA PROBE AMP (~~LOC~~) NOT AT ARMC
Chlamydia: NEGATIVE
Neisseria Gonorrhea: NEGATIVE

## 2014-07-25 LAB — GC/CHLAMYDIA PROBE AMP
Chlamydia trachomatis, NAA: NEGATIVE
NEISSERIA GONORRHOEAE BY PCR: NEGATIVE

## 2014-07-25 LAB — FETAL FIBRONECTIN: Fetal Fibronectin: POSITIVE — AB

## 2014-07-25 MED ORDER — OXYCODONE-ACETAMINOPHEN 5-325 MG PO TABS
1.0000 | ORAL_TABLET | Freq: Four times a day (QID) | ORAL | Status: DC | PRN
  Administered 2014-07-25 – 2014-07-26 (×5): 2 via ORAL
  Filled 2014-07-25 (×4): qty 2

## 2014-07-25 MED ORDER — SODIUM CHLORIDE 0.9 % IV SOLN: 510.0000 mg | Freq: Once | INTRAVENOUS | Status: DC

## 2014-07-25 NOTE — Progress Notes (Signed)
Patient ID: [name], female   DOB: 1986/03/16, 29 y.o.   MRN: [id number] FACULTY PRACTICE ANTEPARTUM(COMPREHENSIVE) NOTE  [name] is a 29 y.o. G2P0100 at 557w4d  who is admitted for Preterm labor.  With cervical dilation to 4.5 cm at admit. Pt has a hx of chronic pancreatitis , but is stable at present with minimally elevated Amylase lipase and no pain She takes opiates as needed during acute exacerbations, but not requiring any at present Fetal presentation is cephalic. Length of Stay:  1  Days  Subjective: Pt responded beautifully to Mag tocolysis, and is having NO contractions now. Patient reports the fetal movement as active. Patient reports uterine contraction  activity as none. Patient reports  vaginal bleeding as none. Patient describes fluid per vagina as None.  Vitals:  Blood pressure 91/55, pulse 88, temperature 97.9 F (36.6 C), temperature source Oral, resp. rate 18, height 5\' 4"  (1.626 m), weight 68.04 kg (150 lb), last menstrual period 12/09/2013. Physical Examination:  General appearance - alert, well appearing, and in no distress, normal appearing weight, improved and well hydrated Heart - normal rate and regular rhythm Abdomen - soft, nontender, nondistended Fundal Height:  size equals dates Cervical Exam: Not evaluated.  Extremities: extremities normal, atraumatic, no cyanosis or edema and Homans sign is negative, no sign of DVT with DTRs 2+ bilaterally Membranes:intact  Fetal Monitoring:  Baseline: 145 bpm, Variability: Good {> 6 bpm), Accelerations: Reactive, Decelerations: Absent and no contractions.  Labs:  Results for orders placed or performed during the hospital encounter of 07/24/14 (from the past 24 hour(s))  CBC   Collection Time: 07/24/14  6:55 PM  Result Value Ref Range   WBC 12.9 (H) 4.0 - 10.5 K/uL   RBC 3.65 (L) 3.87 - 5.11 MIL/uL   Hemoglobin 11.1 (L) 12.0 - 15.0 g/dL   HCT 40.932.4 (L) 81.136.0 - 91.446.0 %   MCV 88.8 78.0 - 100.0 fL   MCH 30.4  26.0 - 34.0 pg   MCHC 34.3 30.0 - 36.0 g/dL   RDW 78.214.0 95.611.5 - 21.315.5 %   Platelets 358 150 - 400 K/uL  RPR   Collection Time: 07/24/14  6:55 PM  Result Value Ref Range   RPR Ser Ql Non Reactive Non Reactive  Amylase   Collection Time: 07/24/14  6:55 PM  Result Value Ref Range   Amylase 132 (H) 0 - 105 U/L  Lipase, blood   Collection Time: 07/24/14  6:55 PM  Result Value Ref Range   Lipase 85 (H) 11 - 59 U/L  Type and screen   Collection Time: 07/24/14  6:55 PM  Result Value Ref Range   ABO/RH(D) A NEG    Antibody Screen NEG    Sample Expiration 07/27/2014   Group B strep by PCR   Collection Time: 07/24/14  7:05 PM  Result Value Ref Range   Group B strep by PCR POSITIVE (A) NEGATIVE  Urine rapid drug screen (hosp performed)   Collection Time: 07/24/14  8:00 PM  Result Value Ref Range   Opiates NONE DETECTED NONE DETECTED   Cocaine NONE DETECTED NONE DETECTED   Benzodiazepines NONE DETECTED NONE DETECTED   Amphetamines NONE DETECTED NONE DETECTED   Tetrahydrocannabinol NONE DETECTED NONE DETECTED   Barbiturates NONE DETECTED NONE DETECTED  Results for orders placed or performed in visit on 07/24/14 (from the past 24 hour(s))  POCT Urinalysis Dipstick   Collection Time: 07/24/14  4:24 PM  Result Value Ref Range   Color,  UA     Clarity, UA     Glucose, UA small    Bilirubin, UA     Ketones, UA small    Spec Grav, UA     Blood, UA neg    pH, UA     Protein, UA trace    Urobilinogen, UA     Nitrite, UA neg    Leukocytes, UA Trace   POCT Glucose (CBG)   Collection Time: 07/24/14  4:26 PM  Result Value Ref Range   POC Glucose 89 70 - 99 mg/dl  POCT Wet Prep Mell[place])   Collection Time: 07/24/14  5:11 PM  Result Value Ref Range   Source Wet Prep POC vaginal    WBC, Wet Prep HPF POC mod    Bacteria Wet Prep HPF POC none    BACTERIA WET PREP MORPHOLOGY POC     Clue Cells Wet Prep HPF POC Many    Clue Cells Wet Prep Whiff POC Positive Whiff    Yeast Wet Prep HPF  POC None    KOH Wet Prep POC     Trichomonas Wet Prep HPF POC none   Results for orders placed or performed in visit on 07/24/14 (from the past 24 hour(s))  Fetal fibronectin   Collection Time: 07/24/14  5:00 PM  Result Value Ref Range   Fetal Fibronectin Positive (A) Negative    Imaging Studies:     Medications:  Scheduled . betamethasone acetate-betamethasone sodium phosphate  12 mg Intramuscular Q24H  . metroNIDAZOLE  500 mg Oral Q12H  . prenatal multivitamin  1 tablet Oral Q1200   I have reviewed the patient's current medications.  ASSESSMENT: Patient Active Problem List   Diagnosis Date Noted  . Prior pregnancy with fetal demise, antepartum 06/25/2014    Priority: Medium  . Abnormal Pap smear of cervix 02/18/2014    Priority: Medium  . Preterm labor 07/24/2014    Priority: Low  . Chronic relapsing pancreatitis 07/04/2014    Priority: Low  . BV (bacterial vaginosis) 07/24/2014  . Abdominal pain affecting pregnancy, antepartum   . Pap smear abnormality of cervix with ASCUS favoring dysplasia 04/08/2014  . Rh negative state in antepartum period 02/13/2014  . Marijuana use 02/13/2014  . Supervision of normal pregnancy 02/12/2014  . Malnutrition of moderate degree 11/18/2012  . Hematemesis 11/04/2012  . Sleep disturbance 08/01/2012  . Generalized anxiety disorder 08/01/2012  . Opiate dependence 02/27/2012  . Tobacco abuse 04/13/2011  . Pancreas divisum 07/23/2010  . Anemia 07/23/2010    PLAN: Mag sulfate til tomorrow morning.  Continuous external toco to continue Complete BMZ dosing, second dose tonite at 6 pm  Oluwasemilore Bahl V 07/25/2014,9:53 AM

## 2014-07-25 NOTE — Consult Note (Signed)
Neonatology Consult  Note:  At the request of the patients obstetrician Dr. Glo Herring I met with [name] and her husband.  She is Currently 30 4  weeks with pregnancy complicated by preterm labor.  Pt has a hx of chronic pancreatitis , but is stable at present with minimally elevated Amylase lipase and no pain.  History of drug use (benzos, opiates, marijuana).   She has responded well to magnesium tocolysis and is due to complete betamethasone course this evening.    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  Fetal echo normal this pregnancy.    We reviewed initial delivery room management, including CPAP, Napoleon, and low but certainly possible need for intubation for surfactant administration.  We discussed feeding immaturity and need for full po intake with multiple days of good weight gain and no apnea or bradycardia before discharge.  We reviewed increased risk of jaundice, infection, and temperature instability.   Discussed likely length of stay.  Thank you for allowing [place] to participate in her care.  Please call with questions.  Higinio Roger, DO  Neonatologist   The total length of face-to-face or floor / unit time for this encounter was 20 minutes.  Counseling and / or coordination of care was greater than fifty percent of the time.

## 2014-07-26 DIAGNOSIS — Z3A30 30 weeks gestation of pregnancy: Secondary | ICD-10-CM

## 2014-07-26 LAB — PMP SCREEN PROFILE (10S), URINE
AMPHETAMINE SCRN UR: NEGATIVE ng/mL
BARBITURATE SCRN UR: NEGATIVE ng/mL
BENZODIAZEPINE SCREEN, URINE: NEGATIVE ng/mL
CANNABINOIDS UR QL SCN: POSITIVE ng/mL
CREATININE(CRT), U: 200.9 mg/dL (ref 20.0–300.0)
Cocaine(Metab.)Screen, Urine: NEGATIVE ng/mL
Methadone Scn, Ur: NEGATIVE ng/mL
OXYCODONE+OXYMORPHONE UR QL SCN: NEGATIVE ng/mL
Opiate Scrn, Ur: NEGATIVE ng/mL
PCP Scrn, Ur: NEGATIVE ng/mL
PH UR, DRUG SCRN: 6.5 (ref 4.5–8.9)
PROPOXYPHENE SCREEN: NEGATIVE ng/mL

## 2014-07-26 MED ORDER — SODIUM CHLORIDE 0.9 % IJ SOLN
3.0000 mL | Freq: Two times a day (BID) | INTRAMUSCULAR | Status: DC
  Administered 2014-07-26: 3 mL via INTRAVENOUS

## 2014-07-26 MED ORDER — NIFEDIPINE ER OSMOTIC RELEASE 30 MG PO TB24
30.0000 mg | ORAL_TABLET | Freq: Two times a day (BID) | ORAL | Status: DC
  Administered 2014-07-26 – 2014-07-27 (×3): 30 mg via ORAL
  Filled 2014-07-26 (×3): qty 1

## 2014-07-26 NOTE — Progress Notes (Signed)
Patient ID: [name], female   DOB: 04-29-1985, 29 y.o.   MRN: [id number] ACULTY PRACTICE ANTEPARTUM COMPREHENSIVE PROGRESS NOTE  [name] is a 29 y.o. G2P0100 at 5622w5d  who is admitted for Preterm labor.   Fetal presentation is cephalic. Length of Stay:  2  Days  Subjective: Pt reports occ ctx.  No LOF. She does report that if she waits too long to void she will leak urine but is always dry AFTER voiding.    Patient reports good fetal movement.  She denies bleeding.  Vitals:  Blood pressure 111/59, pulse 97, temperature 98.6 F (37 C), temperature source Oral, resp. rate 20, height 5\' 4"  (1.626 m), weight 150 lb (68.04 kg), last menstrual period 12/09/2013. Physical Examination: General appearance - alert, well appearing, and in no distress Abdomen - soft, nontender, nondistended, no masses or organomegaly gravid Extremities - peripheral pulses normal, no pedal edema, no clubbing or cyanosis Cervical Exam: Not evaluated.  Membranes:intact  Fetal Monitoring:  Baseline: 150's bpm, Variability: Good {> 6 bpm), Accelerations: Reactive and Decelerations: Absent Toco: occ ctx  Labs:  No results found for this or any previous visit (from the past 24 hour(s)).  Imaging Studies:    none   Medications:  Scheduled . metroNIDAZOLE  500 mg Oral Q12H  . prenatal multivitamin  1 tablet Oral Q1200   I have reviewed the patient's current medications.  ASSESSMENT: Patient Active Problem List   Diagnosis Date Noted  . BV (bacterial vaginosis) 07/24/2014  . Preterm labor 07/24/2014  . Abdominal pain affecting pregnancy, antepartum   . Chronic relapsing pancreatitis 07/04/2014  . Prior pregnancy with fetal demise, antepartum 06/25/2014  . Pap smear abnormality of cervix with ASCUS favoring dysplasia 04/08/2014  . Abnormal Pap smear of cervix 02/18/2014  . Rh negative state in antepartum period 02/13/2014  . Marijuana use 02/13/2014  . Supervision of normal pregnancy 02/12/2014   . Malnutrition of moderate degree 11/18/2012  . Hematemesis 11/04/2012  . Sleep disturbance 08/01/2012  . Generalized anxiety disorder 08/01/2012  . Opiate dependence 02/27/2012  . Tobacco abuse 04/13/2011  . Pancreas divisum 07/23/2010  . Anemia 07/23/2010  PTL with advanced cervical dilation- pt still on Magnesium sulfate- will stop this am as pt has received full course of BMZ and will transition to Procardia.  Will observe on Procardia and discharge in am if no ctx or further cervical change  PLAN: Discontinue Magnesium sulfate Begin procardia Continue routine antenatal care.   HARRAWAY-SMITH, Jahnavi Muratore 07/26/2014,7:06 AM

## 2014-07-26 NOTE — Plan of Care (Signed)
Problem: Consults Goal: Birthing Suites Patient Information Press F2 to bring up selections list  Outcome: Not Applicable Date Met:  35/78/97 Patient admitted to antenatal.

## 2014-07-27 ENCOUNTER — Encounter: Payer: Self-pay | Admitting: Women's Health

## 2014-07-27 LAB — TYPE AND SCREEN
ABO/RH(D): A NEG
ANTIBODY SCREEN: NEGATIVE

## 2014-07-27 MED ORDER — NIFEDIPINE ER 30 MG PO TB24: 30.0000 mg | ORAL_TABLET | Freq: Two times a day (BID) | ORAL | Status: DC

## 2014-07-27 MED ORDER — METRONIDAZOLE 500 MG PO TABS: 500.0000 mg | ORAL_TABLET | Freq: Two times a day (BID) | ORAL | Status: AC

## 2014-07-27 NOTE — Discharge Summary (Signed)
Physician Discharge Summary  Patient ID: [name] MRN: [id number] DOB/AGE: 12-13-1985 29 y.o.  Admit date: 07/24/2014 Discharge date: 07/27/2014  Admission Diagnoses:  1.  Preterm Labor  2.  IUP at 30 weeks  3.  Chronic Pancreatitis  4.  Bacterial Vaginosis  Discharge Diagnoses:   Same  Discharged Condition: stable  Hospital Course: Patient was seen on 4/6 for abdominal pain in MAU and admitted for preterm labor with a cervical dilation of 4.5 cm.  She was admitted on magnesium for tocolysis and was given a course of betamethasone.  She was transitioned to procardia yesterday morning and was remained stable.  She is being discharged to home on modified bedrest with no cervical change.  Consults: None  Significant Diagnostic Studies: none  Treatments: IV hydration and tocolysis  Discharge Exam: Blood pressure 123/76, pulse 91, temperature 98.5 F (36.9 C), temperature source Oral, resp. rate 20, height  (1.626 m), weight 150 lb (68.04 kg), last menstrual period 12/09/2013. General appearance: alert, cooperative and no distress Head: Normocephalic, without obvious abnormality, atraumatic Resp: clear to auscultation bilaterally Cardio: regular rate and rhythm, S1, S2 normal, no murmur, click, rub or gallop GI: soft, non-tender; bowel sounds normal; no masses,  no organomegaly and fundus consistent with dates Extremities: extremities normal, atraumatic, no cyanosis or edema Pulses: 2+ and symmetric   Dilation: 4 Effacement (%): 50 Cervical Position: Middle Station: -2 Presentation: Vertex Exam by:: Dr, Adrian Blackwater  NST category 1 and reactive   Disposition: 01-Home or Self Care  Discharge Instructions    Discharge patient    Complete by:  As directed             Medication List    TAKE these medications        acetaminophen 325 MG tablet  Commonly known as:  TYLENOL  Take 650 mg by mouth every 6 (six) hours as needed for moderate pain or headache (back  pain).     albuterol 108 (90 BASE) MCG/ACT inhaler  Commonly known as:  PROVENTIL HFA;VENTOLIN HFA  Inhale 2 puffs into the lungs every 6 (six) hours as needed for wheezing.     FLINSTONES GUMMIES OMEGA-3 DHA PO  Take 2 tablets by mouth 2 (two) times daily.     metroNIDAZOLE 500 MG tablet  Commonly known as:  FLAGYL  Take 1 tablet (500 mg total) by mouth every 12 (twelve) hours.     MIRALAX PO  Take 1 packet by mouth daily as needed (constipation).     NIFEdipine 30 MG 24 hr tablet  Commonly known as:  PROCARDIA-XL/ADALAT CC  Take 1 tablet (30 mg total) by mouth 2 (two) times daily.     omeprazole 20 MG tablet  Commonly known as:  PRILOSEC OTC  Take 1 tablet (20 mg total) by mouth daily.     ondansetron 8 MG disintegrating tablet  Commonly known as:  ZOFRAN ODT  Take 1 tablet (8 mg total) by mouth every 8 (eight) hours as needed for nausea or vomiting.     oxyCODONE-acetaminophen 5-325 MG per tablet  Commonly known as:  PERCOCET/ROXICET  Take 1-2 tablets by mouth every 4 (four) hours as needed for moderate pain.     promethazine 25 MG tablet  Commonly known as:  PHENERGAN  take 1 tablet by mouth every 6 hours if needed for nausea and vomiting     zolpidem 5 MG tablet  Commonly known as:  AMBIEN  Take 1 tablet (5 mg total) by  mouth at bedtime as needed for sleep.           Follow-up Information    Follow up with [facility] In 2 days.   Contact information:   [address] [place] [phone]     Greater than 30 minutes spent on the discharge of this patient.  SignedCandelaria Celeste: Raelan Burgoon JEHIEL 07/27/2014, 7:49 AM

## 2014-07-27 NOTE — Progress Notes (Signed)
No new orders received, may proceed with discharge.

## 2014-07-27 NOTE — Discharge Instructions (Signed)
Preterm Labor Information Preterm labor is when labor starts at less than 37 weeks of pregnancy. The normal length of a pregnancy is 39 to 41 weeks. CAUSES Often, there is no identifiable underlying cause as to why a woman goes into preterm labor. One of the most common known causes of preterm labor is infection. Infections of the uterus, cervix, vagina, amniotic sac, bladder, kidney, or even the lungs (pneumonia) can cause labor to start. Other suspected causes of preterm labor include:   Urogenital infections, such as yeast infections and bacterial vaginosis.   Uterine abnormalities (uterine shape, uterine septum, fibroids, or bleeding from the placenta).   A cervix that has been operated on (it may fail to stay closed).   Malformations in the fetus.   Multiple gestations (twins, triplets, and so on).   Breakage of the amniotic sac.  RISK FACTORS  Having a previous history of preterm labor.   Having premature rupture of membranes (PROM).   Having a placenta that covers the opening of the cervix (placenta previa).   Having a placenta that separates from the uterus (placental abruption).   Having a cervix that is too weak to hold the fetus in the uterus (incompetent cervix).   Having too much fluid in the amniotic sac (polyhydramnios).   Taking illegal drugs or smoking while pregnant.   Not gaining enough weight while pregnant.   Being younger than 3818 and older than 27 years old.   Having a low socioeconomic status.   Being African American. SYMPTOMS Signs and symptoms of preterm labor include:   Menstrual-like cramps, abdominal pain, or back pain.  Uterine contractions that are regular, as frequent as six in an hour, regardless of their intensity (may be mild or painful).  Contractions that start on the top of the uterus and spread down to the lower abdomen and back.   A sense of increased pelvic pressure.   A watery or bloody mucus discharge that  comes from the vagina.  TREATMENT Depending on the length of the pregnancy and other circumstances, your health care provider may suggest bed rest. If necessary, there are medicines that can be given to stop contractions and to mature the fetal lungs. If labor happens before 34 weeks of pregnancy, a prolonged hospital stay may be recommended. Treatment depends on the condition of both you and the fetus.  WHAT SHOULD YOU DO IF YOU THINK YOU ARE IN PRETERM LABOR? Call your health care provider right away. You will need to go to the hospital to get checked immediately. HOW CAN YOU PREVENT PRETERM LABOR IN FUTURE PREGNANCIES? You should:   Stop smoking if you smoke.  Maintain healthy weight gain and avoid chemicals and drugs that are not necessary.  Be watchful for any type of infection.  Inform your health care provider if you have a known history of preterm labor. Document Released: 06/26/2003 Document Revised: 12/06/2012 Document Reviewed: 05/08/2012 [facility] Patient Information 2015 [place], [place]. This information is not intended to replace advice given to you by your health care provider. Make sure you discuss any questions you have with your health care provider. Preterm Labor Information Preterm labor is when labor starts at less than 37 weeks of pregnancy. The normal length of a pregnancy is 39 to 41 weeks. CAUSES Often, there is no identifiable underlying cause as to why a woman goes into preterm labor. One of the most common known causes of preterm labor is infection. Infections of the uterus, cervix, vagina, amniotic sac, bladder,  bladder, kidney, or even the lungs (pneumonia) can cause labor to start. Other suspected causes of preterm labor include:  °· Urogenital infections, such as yeast infections and bacterial vaginosis.   °· Uterine abnormalities (uterine shape, uterine septum, fibroids, or bleeding from the placenta).   °· A cervix that has been operated on (it may fail to stay  closed).   °· Malformations in the fetus.   °· Multiple gestations (twins, triplets, and so on).   °· Breakage of the amniotic sac.   °RISK FACTORS °· Having a previous history of preterm labor.   °· Having premature rupture of membranes (PROM).   °· Having a placenta that covers the opening of the cervix (placenta previa).   °· Having a placenta that separates from the uterus (placental abruption).   °· Having a cervix that is too weak to hold the fetus in the uterus (incompetent cervix).   °· Having too much fluid in the amniotic sac (polyhydramnios).   °· Taking illegal drugs or smoking while pregnant.   °· Not gaining enough weight while pregnant.   °· Being younger than 18 and older than 27 years old.   °· Having a low socioeconomic status.   °· Being African American. °SYMPTOMS °Signs and symptoms of preterm labor include:  °· Menstrual-like cramps, abdominal pain, or back pain. °· Uterine contractions that are regular, as frequent as six in an hour, regardless of their intensity (may be mild or painful). °· Contractions that start on the top of the uterus and spread down to the lower abdomen and back.   °· A sense of increased pelvic pressure.   °· A watery or bloody mucus discharge that comes from the vagina.   °TREATMENT °Depending on the length of the pregnancy and other circumstances, your health care provider may suggest bed rest. If necessary, there are medicines that can be given to stop contractions and to mature the fetal lungs. If labor happens before 34 weeks of pregnancy, a prolonged hospital stay may be recommended. Treatment depends on the condition of both you and the fetus.  °WHAT SHOULD YOU DO IF YOU THINK YOU ARE IN PRETERM LABOR? °Call your health care provider right away. You will need to go to the hospital to get checked immediately. °HOW CAN YOU PREVENT PRETERM LABOR IN FUTURE PREGNANCIES? °You should:  °· Stop smoking if you smoke.  °· Maintain healthy weight gain and avoid chemicals  and drugs that are not necessary. °· Be watchful for any type of infection. °· Inform your health care provider if you have a known history of preterm labor. °Document Released: 06/26/2003 Document Revised: 12/06/2012 Document Reviewed: 05/08/2012 °ExitCare® Patient Information ©2015 [employer]. This information is not intended to replace advice given to you by your health care provider. Make sure you discuss any questions you have with your health care provider. ° °

## 2014-07-28 ENCOUNTER — Observation Stay (HOSPITAL_COMMUNITY)
Admission: AD | Admit: 2014-07-28 | Discharge: 2014-07-29 | Disposition: A | Discharge status: Home / Self Care | Payer: 59 | Source: Ambulatory Visit | Attending: Family Medicine | Admitting: Family Medicine

## 2014-07-28 ENCOUNTER — Encounter (HOSPITAL_COMMUNITY): Payer: Self-pay

## 2014-07-28 DIAGNOSIS — G8929 Other chronic pain: Secondary | ICD-10-CM | POA: Diagnosis not present

## 2014-07-28 DIAGNOSIS — Z8739 Personal history of other diseases of the musculoskeletal system and connective tissue: Secondary | ICD-10-CM | POA: Insufficient documentation

## 2014-07-28 DIAGNOSIS — O479 False labor, unspecified: Secondary | ICD-10-CM | POA: Diagnosis present

## 2014-07-28 DIAGNOSIS — O99333 Smoking (tobacco) complicating pregnancy, third trimester: Secondary | ICD-10-CM | POA: Insufficient documentation

## 2014-07-28 DIAGNOSIS — O10013 Pre-existing essential hypertension complicating pregnancy, third trimester: Secondary | ICD-10-CM | POA: Diagnosis not present

## 2014-07-28 DIAGNOSIS — O4703 False labor before 37 completed weeks of gestation, third trimester: Principal | ICD-10-CM | POA: Insufficient documentation

## 2014-07-28 DIAGNOSIS — Z8619 Personal history of other infectious and parasitic diseases: Secondary | ICD-10-CM | POA: Diagnosis not present

## 2014-07-28 DIAGNOSIS — F17210 Nicotine dependence, cigarettes, uncomplicated: Secondary | ICD-10-CM | POA: Diagnosis not present

## 2014-07-28 DIAGNOSIS — Z3A31 31 weeks gestation of pregnancy: Secondary | ICD-10-CM | POA: Insufficient documentation

## 2014-07-28 DIAGNOSIS — Z8659 Personal history of other mental and behavioral disorders: Secondary | ICD-10-CM | POA: Insufficient documentation

## 2014-07-28 DIAGNOSIS — Z8719 Personal history of other diseases of the digestive system: Secondary | ICD-10-CM | POA: Insufficient documentation

## 2014-07-28 DIAGNOSIS — O4700 False labor before 37 completed weeks of gestation, unspecified trimester: Secondary | ICD-10-CM | POA: Insufficient documentation

## 2014-07-28 DIAGNOSIS — O9989 Other specified diseases and conditions complicating pregnancy, childbirth and the puerperium: Secondary | ICD-10-CM | POA: Insufficient documentation

## 2014-07-28 LAB — RAPID URINE DRUG SCREEN, HOSP PERFORMED
AMPHETAMINES: NOT DETECTED
Barbiturates: NOT DETECTED
Benzodiazepines: NOT DETECTED
Cocaine: NOT DETECTED
Opiates: NOT DETECTED
Tetrahydrocannabinol: NOT DETECTED

## 2014-07-28 LAB — URINALYSIS, ROUTINE W REFLEX MICROSCOPIC
Glucose, UA: 500 mg/dL — AB
Hgb urine dipstick: NEGATIVE
KETONES UR: 15 mg/dL — AB
LEUKOCYTES UA: NEGATIVE
NITRITE: NEGATIVE
PH: 6 (ref 5.0–8.0)
Protein, ur: NEGATIVE mg/dL
Specific Gravity, Urine: >1.03 — ABNORMAL HIGH (ref 1.005–1.030)
UROBILINOGEN UA: 0.2 mg/dL (ref 0.0–1.0)

## 2014-07-28 LAB — CBC
HCT: 33.3 % — ABNORMAL LOW (ref 36.0–46.0)
Hemoglobin: 11.2 g/dL — ABNORMAL LOW (ref 12.0–15.0)
MCH: 30.2 pg (ref 26.0–34.0)
MCHC: 33.6 g/dL (ref 30.0–36.0)
MCV: 89.8 fL (ref 78.0–100.0)
Platelets: 324 10*3/uL (ref 150–400)
RBC: 3.71 MIL/uL — ABNORMAL LOW (ref 3.87–5.11)
RDW: 14.2 % (ref 11.5–15.5)
WBC: 14.3 10*3/uL — AB (ref 4.0–10.5)

## 2014-07-28 LAB — COMPREHENSIVE METABOLIC PANEL
ALK PHOS: 95 U/L (ref 39–117)
ALT: 18 U/L (ref 0–35)
AST: 27 U/L (ref 0–37)
Albumin: 3.2 g/dL — ABNORMAL LOW (ref 3.5–5.2)
Anion gap: 12 (ref 5–15)
BUN: 15 mg/dL (ref 6–23)
CALCIUM: 9.5 mg/dL (ref 8.4–10.5)
CHLORIDE: 102 mmol/L (ref 96–112)
CO2: 20 mmol/L (ref 19–32)
Creatinine, Ser: 0.51 mg/dL (ref 0.50–1.10)
GFR calc Af Amer: >90 mL/min (ref >90)
Glucose, Bld: 68 mg/dL — ABNORMAL LOW (ref 70–99)
Potassium: 4.4 mmol/L (ref 3.5–5.1)
SODIUM: 134 mmol/L — AB (ref 135–145)
TOTAL PROTEIN: 6.9 g/dL (ref 6.0–8.3)
Total Bilirubin: 0.6 mg/dL (ref 0.3–1.2)

## 2014-07-28 LAB — TYPE AND SCREEN
ABO/RH(D): A NEG
Antibody Screen: NEGATIVE

## 2014-07-28 MED ORDER — ZOLPIDEM TARTRATE 5 MG PO TABS: 5.0000 mg | ORAL_TABLET | Freq: Every evening | ORAL | Status: DC | PRN

## 2014-07-28 MED ORDER — DOCUSATE SODIUM 100 MG PO CAPS
100.0000 mg | ORAL_CAPSULE | Freq: Every day | ORAL | Status: DC
  Administered 2014-07-29: 100 mg via ORAL
  Filled 2014-07-28 (×2): qty 1

## 2014-07-28 MED ORDER — PRENATAL MULTIVITAMIN CH
1.0000 | ORAL_TABLET | Freq: Every day | ORAL | Status: DC
Start: 2014-07-29 — End: 2014-07-29
  Administered 2014-07-29: 1 via ORAL
  Filled 2014-07-28: qty 1

## 2014-07-28 MED ORDER — MAGNESIUM SULFATE BOLUS VIA INFUSION
4.0000 g | Freq: Once | INTRAVENOUS | Status: AC
  Administered 2014-07-28: 4 g via INTRAVENOUS
  Filled 2014-07-28: qty 500

## 2014-07-28 MED ORDER — OXYCODONE-ACETAMINOPHEN 5-325 MG PO TABS
1.0000 | ORAL_TABLET | Freq: Four times a day (QID) | ORAL | Status: DC | PRN
  Administered 2014-07-28: 1 via ORAL
  Filled 2014-07-28: qty 1

## 2014-07-28 MED ORDER — LACTATED RINGERS IV SOLN
INTRAVENOUS | Status: DC
  Administered 2014-07-28: 100 mL/h via INTRAVENOUS
  Administered 2014-07-29: 07:00:00 via INTRAVENOUS

## 2014-07-28 MED ORDER — FENTANYL CITRATE 0.05 MG/ML IJ SOLN: 50.0000 ug | INTRAMUSCULAR | Status: DC | PRN

## 2014-07-28 MED ORDER — FENTANYL CITRATE 0.05 MG/ML IJ SOLN
100.0000 ug | Freq: Once | INTRAMUSCULAR | Status: AC
  Administered 2014-07-28: 100 ug via INTRAVENOUS
  Filled 2014-07-28: qty 2

## 2014-07-28 MED ORDER — PROMETHAZINE HCL 25 MG/ML IJ SOLN
12.5000 mg | Freq: Once | INTRAMUSCULAR | Status: DC
  Filled 2014-07-28: qty 1

## 2014-07-28 MED ORDER — LACTATED RINGERS IV BOLUS (SEPSIS)
1000.0000 mL | Freq: Once | INTRAVENOUS | Status: AC
  Administered 2014-07-28: 1000 mL via INTRAVENOUS

## 2014-07-28 MED ORDER — MAGNESIUM SULFATE 40 G IN LACTATED RINGERS - SIMPLE
2.0000 g/h | INTRAVENOUS | Status: DC
  Administered 2014-07-28: 2 g/h via INTRAVENOUS
  Filled 2014-07-28: qty 500

## 2014-07-28 MED ORDER — ACETAMINOPHEN 325 MG PO TABS: 650.0000 mg | ORAL_TABLET | ORAL | Status: DC | PRN

## 2014-07-28 MED ORDER — CALCIUM CARBONATE ANTACID 500 MG PO CHEW: 2.0000 | CHEWABLE_TABLET | ORAL | Status: DC | PRN

## 2014-07-28 MED ORDER — ZOLPIDEM TARTRATE 5 MG PO TABS
5.0000 mg | ORAL_TABLET | Freq: Every day | ORAL | Status: DC
  Administered 2014-07-28: 5 mg via ORAL
  Filled 2014-07-28: qty 1

## 2014-07-28 MED ORDER — PROMETHAZINE HCL 25 MG/ML IJ SOLN: 12.5000 mg | INTRAMUSCULAR | Status: DC | PRN

## 2014-07-28 MED ORDER — TERBUTALINE SULFATE 1 MG/ML IJ SOLN
0.2500 mg | Freq: Once | INTRAMUSCULAR | Status: AC
  Administered 2014-07-28: 0.25 mg via SUBCUTANEOUS
  Filled 2014-07-28: qty 1

## 2014-07-28 NOTE — MAU Note (Signed)
Per pt ctx's q8-12 minutes. Was just d/c'd yesterday from Antenatal. Was on Magnesium and BMZx2.Has been taking procardia as directed. Increased vaginal pressure. Was 4.75cm yesterday.
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
## 2014-07-28 NOTE — MAU Provider Note (Signed)
S: States uc's are becoming closer and stronger O: SVE 4/80/-2, uc's have become more regular 2-5 min apart. FHR pattern reassuring A: preterm contractions. Admit. MGSO4, monitor closely.

## 2014-07-28 NOTE — Progress Notes (Signed)
Orders obtained for terb and iv pain meds.

## 2014-07-29 DIAGNOSIS — O4703 False labor before 37 completed weeks of gestation, third trimester: Secondary | ICD-10-CM | POA: Diagnosis not present

## 2014-07-29 DIAGNOSIS — O4700 False labor before 37 completed weeks of gestation, unspecified trimester: Secondary | ICD-10-CM | POA: Insufficient documentation

## 2014-07-29 MED ORDER — NIFEDIPINE ER 60 MG PO TB24: 60.0000 mg | ORAL_TABLET | Freq: Two times a day (BID) | ORAL | Status: DC

## 2014-07-29 MED ORDER — NIFEDIPINE ER OSMOTIC RELEASE 30 MG PO TB24
60.0000 mg | ORAL_TABLET | Freq: Two times a day (BID) | ORAL | Status: DC
  Administered 2014-07-29: 60 mg via ORAL
  Filled 2014-07-29: qty 2

## 2014-07-29 NOTE — Progress Notes (Signed)
Patient ID: [name], female   DOB: 26-Mar-1986, 29 y.o.   MRN: [id number] FACULTY PRACTICE ANTEPARTUM(COMPREHENSIVE) NOTE  [name] is a 29 y.o. G2P0100 at [redacted]w[redacted]d by best clinical estimate who is admitted for Preterm labor.   Fetal presentation is cephalic. Length of Stay:    Days  Subjective: Feels better this am Patient reports the fetal movement as active. Patient reports uterine contraction  activity as irregular, every 5-8 minutes. Patient reports  vaginal bleeding as none. Patient describes fluid per vagina as None.  Vitals:  Blood pressure 92/62, pulse 86, temperature 97.5 F (36.4 C), temperature source Oral, resp. rate 16, height  (1.626 m), weight 150 lb (68.04 kg), last menstrual period 12/09/2013, SpO2 99 %. Physical Examination:  General appearance - alert, well appearing, and in no distress Abdomen - gravid, NT Fundal Height:  size equals dates. Extremities: extremities normal, atraumatic, no cyanosis or edema  Membranes:intact  Fetal Monitoring:  Baseline: 145 bpm, Variability: Good {> 6 bpm), Accelerations: Reactive and Decelerations: Absent  Labs:  Results for orders placed or performed during the hospital encounter of 07/28/14 (from the past 24 hour(s))  Urinalysis, Routine w reflex microscopic   Collection Time: 07/28/14  5:01 PM  Result Value Ref Range   Color, Urine YELLOW YELLOW   APPearance CLEAR CLEAR   Specific Gravity, Urine >1.030 (H) 1.005 - 1.030   pH 6.0 5.0 - 8.0   Glucose, UA 500 (A) NEGATIVE mg/dL   Hgb urine dipstick NEGATIVE NEGATIVE   Bilirubin Urine SMALL (A) NEGATIVE   Ketones, ur 15 (A) NEGATIVE mg/dL   Protein, ur NEGATIVE NEGATIVE mg/dL   Urobilinogen, UA 0.2 0.0 - 1.0 mg/dL   Nitrite NEGATIVE NEGATIVE   Leukocytes, UA NEGATIVE NEGATIVE  Comprehensive metabolic panel   Collection Time: 07/28/14  5:30 PM  Result Value Ref Range   Sodium 134 (L) 135 - 145 mmol/L   Potassium 4.4 3.5 - 5.1 mmol/L   Chloride 102 96 - 112  mmol/L   CO2 20 19 - 32 mmol/L   Glucose, Bld 68 (L) 70 - 99 mg/dL   BUN 15 6 - 23 mg/dL   Creatinine, Ser 4.09 0.50 - 1.10 mg/dL   Calcium 9.5 8.4 - 81.1 mg/dL   Total Protein 6.9 6.0 - 8.3 g/dL   Albumin 3.2 (L) 3.5 - 5.2 g/dL   AST 27 0 - 37 U/L   ALT 18 0 - 35 U/L   Alkaline Phosphatase 95 39 - 117 U/L   Total Bilirubin 0.6 0.3 - 1.2 mg/dL   GFR calc non Af Amer >90 >90 mL/min   GFR calc Af Amer >90 >90 mL/min   Anion gap 12 5 - 15  CBC on admission   Collection Time: 07/28/14  5:30 PM  Result Value Ref Range   WBC 14.3 (H) 4.0 - 10.5 K/uL   RBC 3.71 (L) 3.87 - 5.11 MIL/uL   Hemoglobin 11.2 (L) 12.0 - 15.0 g/dL   HCT 91.4 (L) 78.2 - 95.6 %   MCV 89.8 78.0 - 100.0 fL   MCH 30.2 26.0 - 34.0 pg   MCHC 33.6 30.0 - 36.0 g/dL   RDW 21.3 08.6 - 57.8 %   Platelets 324 150 - 400 K/uL  Type and screen   Collection Time: 07/28/14  5:30 PM  Result Value Ref Range   ABO/RH(D) A NEG    Antibody Screen NEG    Sample Expiration 07/31/2014   Urine rapid drug screen (  hosp performed)   Collection Time: 07/28/14  8:50 PM  Result Value Ref Range   Opiates NONE DETECTED NONE DETECTED   Cocaine NONE DETECTED NONE DETECTED   Benzodiazepines NONE DETECTED NONE DETECTED   Amphetamines NONE DETECTED NONE DETECTED   Tetrahydrocannabinol NONE DETECTED NONE DETECTED   Barbiturates NONE DETECTED NONE DETECTED    Medications:  Scheduled . docusate sodium  100 mg Oral Daily  . prenatal multivitamin  1 tablet Oral Q1200  . promethazine  12.5 mg Intravenous Once  . zolpidem  5 mg Oral QHS   I have reviewed the patient's current medications.  ASSESSMENT: Patient Active Problem List   Diagnosis Date Noted  . Preterm contractions 07/28/2014  . Preterm labor in third trimester without delivery 07/27/2014  . BV (bacterial vaginosis) 07/24/2014  . Abdominal pain affecting pregnancy, antepartum   . Chronic relapsing pancreatitis 07/04/2014  . Prior pregnancy with fetal demise, antepartum  06/25/2014  . Pap smear abnormality of cervix with ASCUS favoring dysplasia 04/08/2014  . Rh negative state in antepartum period 02/13/2014  . Marijuana use 02/13/2014  . Supervision of other high-risk pregnancy 02/12/2014  . Malnutrition of moderate degree 11/18/2012  . Hematemesis 11/04/2012  . Sleep disturbance 08/01/2012  . Generalized anxiety disorder 08/01/2012  . Opiate dependence 02/27/2012  . Tobacco abuse 04/13/2011  . Pancreas divisum 07/23/2010  . Anemia 07/23/2010    PLAN: Will transition from Magnesium to Procardia 60 mg XL this am--if stable home later today or tomorrow.  Reva BoresPRATT,Shaune Westfall S, MD 07/29/2014,8:01 AM
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
## 2014-07-29 NOTE — Progress Notes (Signed)
Ur chart review completed.  

## 2014-07-29 NOTE — Discharge Summary (Cosign Needed)
Antenatal Physician Discharge Summary  Patient ID: PALYN SCRIMA MRN: [id number] DOB/AGE: February 17, 1986 29 y.o.  Admit date: 07/28/2014 Discharge date: 07/29/2014  Admission Diagnoses: Threatened preterm labor Preterm contractions  Discharge Diagnoses:  Preterm contractions  Prenatal Procedures: none  Intrapartum Procedures: Neonatology, Maternal Fetal Medicine n/a  Significant Diagnostic Studies:  Results for orders placed or performed during the hospital encounter of 07/28/14 (from the past 168 hour(s))  Urinalysis, Routine w reflex microscopic   Collection Time: 07/28/14  5:01 PM  Result Value Ref Range   Color, Urine YELLOW YELLOW   APPearance CLEAR CLEAR   Specific Gravity, Urine >1.030 (H) 1.005 - 1.030   pH 6.0 5.0 - 8.0   Glucose, UA 500 (A) NEGATIVE mg/dL   Hgb urine dipstick NEGATIVE NEGATIVE   Bilirubin Urine SMALL (A) NEGATIVE   Ketones, ur 15 (A) NEGATIVE mg/dL   Protein, ur NEGATIVE NEGATIVE mg/dL   Urobilinogen, UA 0.2 0.0 - 1.0 mg/dL   Nitrite NEGATIVE NEGATIVE   Leukocytes, UA NEGATIVE NEGATIVE  Comprehensive metabolic panel   Collection Time: 07/28/14  5:30 PM  Result Value Ref Range   Sodium 134 (L) 135 - 145 mmol/L   Potassium 4.4 3.5 - 5.1 mmol/L   Chloride 102 96 - 112 mmol/L   CO2 20 19 - 32 mmol/L   Glucose, Bld 68 (L) 70 - 99 mg/dL   BUN 15 6 - 23 mg/dL   Creatinine, Ser 4.09 0.50 - 1.10 mg/dL   Calcium 9.5 8.4 - 81.1 mg/dL   Total Protein 6.9 6.0 - 8.3 g/dL   Albumin 3.2 (L) 3.5 - 5.2 g/dL   AST 27 0 - 37 U/L   ALT 18 0 - 35 U/L   Alkaline Phosphatase 95 39 - 117 U/L   Total Bilirubin 0.6 0.3 - 1.2 mg/dL   GFR calc non Af Amer >90 >90 mL/min   GFR calc Af Amer >90 >90 mL/min   Anion gap 12 5 - 15  CBC on admission   Collection Time: 07/28/14  5:30 PM  Result Value Ref Range   WBC 14.3 (H) 4.0 - 10.5 K/uL   RBC 3.71 (L) 3.87 - 5.11 MIL/uL   Hemoglobin 11.2 (L) 12.0 - 15.0 g/dL   HCT 91.4 (L) 78.2 - 95.6 %   MCV 89.8 78.0 - 100.0 fL    MCH 30.2 26.0 - 34.0 pg   MCHC 33.6 30.0 - 36.0 g/dL   RDW 21.3 08.6 - 57.8 %   Platelets 324 150 - 400 K/uL  Type and screen   Collection Time: 07/28/14  5:30 PM  Result Value Ref Range   ABO/RH(D) A NEG    Antibody Screen NEG    Sample Expiration 07/31/2014   Urine rapid drug screen (hosp performed)   Collection Time: 07/28/14  8:50 PM  Result Value Ref Range   Opiates NONE DETECTED NONE DETECTED   Cocaine NONE DETECTED NONE DETECTED   Benzodiazepines NONE DETECTED NONE DETECTED   Amphetamines NONE DETECTED NONE DETECTED   Tetrahydrocannabinol NONE DETECTED NONE DETECTED   Barbiturates NONE DETECTED NONE DETECTED  Results for orders placed or performed during the hospital encounter of 07/24/14 (from the past 168 hour(s))  GC/Chlamydia probe amp ([place])   Collection Time: 07/24/14 12:00 AM  Result Value Ref Range   Chlamydia Negative    Neisseria gonorrhea Negative   CBC   Collection Time: 07/24/14  6:55 PM  Result Value Ref Range   WBC 12.9 (H) 4.0 - 10.5  K/uL   RBC 3.65 (L) 3.87 - 5.11 MIL/uL   Hemoglobin 11.1 (L) 12.0 - 15.0 g/dL   HCT 16.1 (L) 09.6 - 04.5 %   MCV 88.8 78.0 - 100.0 fL   MCH 30.4 26.0 - 34.0 pg   MCHC 34.3 30.0 - 36.0 g/dL   RDW 40.9 81.1 - 91.4 %   Platelets 358 150 - 400 K/uL  RPR   Collection Time: 07/24/14  6:55 PM  Result Value Ref Range   RPR Ser Ql Non Reactive Non Reactive  Amylase   Collection Time: 07/24/14  6:55 PM  Result Value Ref Range   Amylase 132 (H) 0 - 105 U/L  Lipase, blood   Collection Time: 07/24/14  6:55 PM  Result Value Ref Range   Lipase 85 (H) 11 - 59 U/L  Type and screen   Collection Time: 07/24/14  6:55 PM  Result Value Ref Range   ABO/RH(D) A NEG    Antibody Screen NEG    Sample Expiration 07/27/2014   Group B strep by PCR   Collection Time: 07/24/14  7:05 PM  Result Value Ref Range   Group B strep by PCR POSITIVE (A) NEGATIVE  Urine rapid drug screen (hosp performed)   Collection Time: 07/24/14   8:00 PM  Result Value Ref Range   Opiates NONE DETECTED NONE DETECTED   Cocaine NONE DETECTED NONE DETECTED   Benzodiazepines NONE DETECTED NONE DETECTED   Amphetamines NONE DETECTED NONE DETECTED   Tetrahydrocannabinol NONE DETECTED NONE DETECTED   Barbiturates NONE DETECTED NONE DETECTED  Type and screen   Collection Time: 07/27/14  8:23 AM  Result Value Ref Range   ABO/RH(D) A NEG    Antibody Screen NEG    Sample Expiration 07/30/2014   Results for orders placed or performed in visit on 07/24/14 (from the past 168 hour(s))  POCT Urinalysis Dipstick   Collection Time: 07/24/14  4:24 PM  Result Value Ref Range   Color, UA     Clarity, UA     Glucose, UA small    Bilirubin, UA     Ketones, UA small    Spec Grav, UA     Blood, UA neg    pH, UA     Protein, UA trace    Urobilinogen, UA     Nitrite, UA neg    Leukocytes, UA Trace   POCT Glucose (CBG)   Collection Time: 07/24/14  4:26 PM  Result Value Ref Range   POC Glucose 89 70 - 99 mg/dl  POCT Wet Prep [employer] [place])   Collection Time: 07/24/14  5:11 PM  Result Value Ref Range   Source Wet Prep POC vaginal    WBC, Wet Prep HPF POC mod    Bacteria Wet Prep HPF POC none    BACTERIA WET PREP MORPHOLOGY POC     Clue Cells Wet Prep HPF POC Many    Clue Cells Wet Prep Whiff POC Positive Whiff    Yeast Wet Prep HPF POC None    KOH Wet Prep POC     Trichomonas Wet Prep HPF POC none   GC/Chlamydia Probe Amp   Collection Time: 07/24/14  5:51 PM  Result Value Ref Range   Chlamydia trachomatis, NAA Negative Negative   Neisseria gonorrhoeae by PCR Negative Negative   PLEASE NOTE: Comment   Pain Management Screening Profile (10S)   Collection Time: 07/24/14  5:52 PM  Result Value Ref Range   Amphetamine Screen, Ur Negative Cutoff=1000  ng/mL   Barbiturate Screen, Ur Negative Cutoff=200 ng/mL   Benzodiazepine Screen, Urine Negative Cutoff=200 ng/mL   Cannabinoids Ur Ql Scn Positive Cutoff=20 ng/mL   Cocaine(Metab.)Screen,  Urine Negative Cutoff=300 ng/mL   Opiate Scrn, Ur Negative Cutoff=300 ng/mL   Oxycodone+Oxymorphone Ur Ql Scn Negative Cutoff=100 ng/mL   PCP Scrn, Ur Negative Cutoff=25 ng/mL   Methadone Scn, Ur Negative Cutoff=300 ng/mL   Propoxyphene, Screen Negative Cutoff=300 ng/mL   Creatinine(Crt), U 200.9 20.0 - 300.0 mg/dL   Ph of Urine 6.5 4.5 - 8.9   PLEASE NOTE: Comment   Results for orders placed or performed in visit on 07/24/14 (from the past 168 hour(s))  Fetal fibronectin   Collection Time: 07/24/14  5:00 PM  Result Value Ref Range   Fetal Fibronectin Positive (A) Negative  Results for orders placed or performed in visit on 06/05/14 (from the past 168 hour(s))  Drug Screen, Urine   Collection Time: 07/24/14  5:53 PM  Result Value Ref Range   Amphetamines, Urine Negative Cutoff=1000 ng/mL   BARBITURATE SCREEN URINE Negative Cutoff=200 ng/mL   Benzodiazepine Quant, Ur Negative Cutoff=300 ng/mL   Cannabinoid Quant, Ur Positive Cutoff=50 ng/mL   Cocaine (Metab.) Negative Cutoff=300 ng/mL   Opiate Quant, Ur Negative Cutoff=300 ng/mL   PCP Quant, Ur Negative Cutoff=25 ng/mL   Drug Screen Comment: Comment     Treatments: IV hydration and Magnesium IV, Procardia  Hospital Course:  This is a 29 y.o. G2P0100 with IUP at [redacted]w[redacted]d admitted for observation of preterm contractions with advanced cervical dilation. On admission, her cervix was dilated to 4/70/ballotable. Of note, she was discharged the day before after being hospitalized for the same symptoms. Her cervical examination at discharge was 4/50/ballotable. During her initial hospitalization she was treated with BMZ x2 and MgSO4 and ultimately discharged to home with Procardia when her cervical examination remained unchanged and contractions stopped. After discharge, she noted contractions with increasing frequency and intensity. No leaking of fluid and no bleeding. She was initially started on magnesium sulfate for tocolysis and  neuroprotection.  Her tocolysis was transitioned to Procardia.  She was observed, fetal heart rate monitoring remained reassuring, and she had no signs/symptoms of progressing preterm labor or other maternal-fetal concerns.  Her cervical exam was unchanged from admission.  She was deemed stable for discharge to home with outpatient follow up.     Discharge Exam: BP 121/76 mmHg  Pulse 94  Temp(Src) 98.2 F (36.8 C) (Oral)  Resp 18  Ht 5\' 4"  (1.626 m)  Wt 150 lb (68.04 kg)  BMI 25.73 kg/m2  SpO2 97%  LMP 12/09/2013 General appearance: alert, cooperative and no distress Resp: clear to auscultation bilaterally Cardio: regular rate and rhythm, S1, S2 normal, no murmur, click, rub or gallop GI: gravid, nontender Pelvic: external genitalia normal, no cervical motion tenderness and SVE 4/70/ballotable  Discharge Condition: stable  Disposition: 01-Home or Self Care     Medication List    TAKE these medications        acetaminophen 325 MG tablet  Commonly known as:  TYLENOL  Take 650 mg by mouth every 6 (six) hours as needed for mild pain or headache.     albuterol 108 (90 BASE) MCG/ACT inhaler  Commonly known as:  PROVENTIL HFA;VENTOLIN HFA  Inhale 2 puffs into the lungs every 6 (six) hours as needed for wheezing.     FLINSTONES GUMMIES OMEGA-3 DHA PO  Take 2 each by mouth 2 (two) times daily.     metroNIDAZOLE  500 MG tablet  Commonly known as:  FLAGYL  Take 1 tablet (500 mg total) by mouth every 12 (twelve) hours.     MIRALAX PO  Take 1 packet by mouth daily as needed (for constipation).     NIFEdipine 60 MG 24 hr tablet  Commonly known as:  PROCARDIA-XL/ADALAT CC  Take 1 tablet (60 mg total) by mouth 2 (two) times daily.     omeprazole 20 MG tablet  Commonly known as:  PRILOSEC OTC  Take 1 tablet (20 mg total) by mouth daily.     ondansetron 8 MG disintegrating tablet  Commonly known as:  ZOFRAN ODT  Take 1 tablet (8 mg total) by mouth every 8 (eight) hours as needed  for nausea or vomiting.     oxyCODONE-acetaminophen 5-325 MG per tablet  Commonly known as:  PERCOCET/ROXICET  Take 1-2 tablets by mouth every 4 (four) hours as needed for moderate pain.     promethazine 25 MG tablet  Commonly known as:  PHENERGAN  take 1 tablet by mouth every 6 hours if needed for nausea and vomiting     zolpidem 5 MG tablet  Commonly known as:  AMBIEN  Take 1 tablet (5 mg total) by mouth at bedtime as needed for sleep.         Signed: William DaltonMorgan Ryka Beighley MD 07/29/2014 5:00 PM  Patient and plan discussed with Dr. Jolayne Pantheronstant, attending physician, prior to discharge from the hospital.

## 2014-07-29 NOTE — Progress Notes (Signed)
Patient will be discharged home at this time.  She understands her discharge instructions and will return to [facility] with any problems.

## 2014-07-29 NOTE — Progress Notes (Signed)
Dr Ane PaymentMceachern in to evaluate for discharge. Her strip was reviewed with MD.

## 2014-07-30 LAB — HIV ANTIBODY (ROUTINE TESTING W REFLEX): HIV Screen 4th Generation wRfx: NONREACTIVE

## 2014-07-31 ENCOUNTER — Ambulatory Visit (INDEPENDENT_AMBULATORY_CARE_PROVIDER_SITE_OTHER): Payer: 59 | Admitting: Women's Health

## 2014-07-31 ENCOUNTER — Encounter: Payer: Self-pay | Admitting: Women's Health

## 2014-07-31 ENCOUNTER — Inpatient Hospital Stay (HOSPITAL_COMMUNITY)
Admission: EM | Admit: 2014-07-31 | Discharge: 2014-07-31 | Disposition: A | Discharge status: Home / Self Care | Payer: 59 | Source: Ambulatory Visit | Attending: Obstetrics & Gynecology | Admitting: Obstetrics & Gynecology

## 2014-07-31 ENCOUNTER — Encounter (HOSPITAL_COMMUNITY): Payer: Self-pay | Admitting: *Deleted

## 2014-07-31 VITALS — BP 104/78 | HR 76 | Wt 152.0 lb

## 2014-07-31 DIAGNOSIS — O368131 Decreased fetal movements, third trimester, fetus 1: Secondary | ICD-10-CM | POA: Diagnosis not present

## 2014-07-31 DIAGNOSIS — Z3A31 31 weeks gestation of pregnancy: Secondary | ICD-10-CM

## 2014-07-31 DIAGNOSIS — O368130 Decreased fetal movements, third trimester, not applicable or unspecified: Secondary | ICD-10-CM | POA: Diagnosis not present

## 2014-07-31 DIAGNOSIS — Z1389 Encounter for screening for other disorder: Secondary | ICD-10-CM

## 2014-07-31 DIAGNOSIS — O09893 Supervision of other high risk pregnancies, third trimester: Secondary | ICD-10-CM | POA: Diagnosis not present

## 2014-07-31 DIAGNOSIS — Z331 Pregnant state, incidental: Secondary | ICD-10-CM

## 2014-07-31 LAB — URINALYSIS, ROUTINE W REFLEX MICROSCOPIC
BILIRUBIN URINE: NEGATIVE
GLUCOSE, UA: 500 mg/dL — AB
HGB URINE DIPSTICK: NEGATIVE
Ketones, ur: NEGATIVE mg/dL
Leukocytes, UA: NEGATIVE
Nitrite: NEGATIVE
PROTEIN: NEGATIVE mg/dL
Specific Gravity, Urine: 1.025 (ref 1.005–1.030)
Urobilinogen, UA: 0.2 mg/dL (ref 0.0–1.0)
pH: 6 (ref 5.0–8.0)

## 2014-07-31 LAB — POCT URINALYSIS DIPSTICK
Blood, UA: NEGATIVE
GLUCOSE UA: NEGATIVE
Ketones, UA: NEGATIVE
NITRITE UA: NEGATIVE
Protein, UA: NEGATIVE

## 2014-07-31 MED ORDER — OXYCODONE-ACETAMINOPHEN 5-325 MG PO TABS
1.0000 | ORAL_TABLET | Freq: Once | ORAL | Status: AC
  Administered 2014-07-31: 1 via ORAL
  Filled 2014-07-31: qty 1

## 2014-07-31 MED ORDER — NIFEDIPINE 10 MG PO CAPS
20.0000 mg | ORAL_CAPSULE | Freq: Once | ORAL | Status: AC
  Administered 2014-07-31: 20 mg via ORAL
  Filled 2014-07-31: qty 2

## 2014-07-31 MED ORDER — NIFEDIPINE 10 MG PO CAPS
10.0000 mg | ORAL_CAPSULE | Freq: Once | ORAL | Status: AC
  Administered 2014-07-31: 10 mg via ORAL
  Filled 2014-07-31: qty 1

## 2014-07-31 NOTE — Progress Notes (Signed)
High Risk Pregnancy Diagnosis(es): ptl G2P0100 3568w3d Estimated Date of Delivery: 09/29/14 BP 104/78 mmHg  Pulse 76  Wt 152 lb (68.947 kg)  LMP 12/09/2013  Urinalysis: Negative HPI:  D/c'd from [facility] Monday @ 4/70/-2 on procardia 60 BID, uc's started again last night and are uncomfortable, decreased fm since uc's. BP, weight, and urine reviewed.  Denieslof, vb, uti s/s.   Fundal Height:  31 Fetal Heart rate:  130, reactive nst. 2 uc's on strip Edema:  trace SVE: 5/70/-2, slight BBOW, vtx  To [facility] for further observation for ptl, notified marie, cnm to be expecting pt All questions were answered Assessment: 7568w3d ptl Medication(s) Plans:  Per hospital Treatment Plan:  To [facility] Follow up in 1wk if d/c'd for high-risk OB appt
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
## 2014-07-31 NOTE — MAU Note (Addendum)
Wynelle BourgeoisMarie Williams CNM at bedside reviewing blood pressure and monitoring tracing

## 2014-07-31 NOTE — Discharge Instructions (Signed)
Preterm Labor Information °Preterm labor is when labor starts at less than 37 weeks of pregnancy. The normal length of a pregnancy is 39 to 41 weeks. °CAUSES °Often, there is no identifiable underlying cause as to why a woman goes into preterm labor. One of the most common known causes of preterm labor is infection. Infections of the uterus, cervix, vagina, amniotic sac, bladder, kidney, or even the lungs (pneumonia) can cause labor to start. Other suspected causes of preterm labor include:  °· Urogenital infections, such as yeast infections and bacterial vaginosis.   °· Uterine abnormalities (uterine shape, uterine septum, fibroids, or bleeding from the placenta).   °· A cervix that has been operated on (it may fail to stay closed).   °· Malformations in the fetus.   °· Multiple gestations (twins, triplets, and so on).   °· Breakage of the amniotic sac.   °RISK FACTORS °· Having a previous history of preterm labor.   °· Having premature rupture of membranes (PROM).   °· Having a placenta that covers the opening of the cervix (placenta previa).   °· Having a placenta that separates from the uterus (placental abruption).   °· Having a cervix that is too weak to hold the fetus in the uterus (incompetent cervix).   °· Having too much fluid in the amniotic sac (polyhydramnios).   °· Taking illegal drugs or smoking while pregnant.   °· Not gaining enough weight while pregnant.   °· Being younger than 18 and older than 27 years old.   °· Having a low socioeconomic status.   °· Being African American. °SYMPTOMS °Signs and symptoms of preterm labor include:  °· Menstrual-like cramps, abdominal pain, or back pain. °· Uterine contractions that are regular, as frequent as six in an hour, regardless of their intensity (may be mild or painful). °· Contractions that start on the top of the uterus and spread down to the lower abdomen and back.   °· A sense of increased pelvic pressure.   °· A watery or bloody mucus discharge that  comes from the vagina.   °TREATMENT °Depending on the length of the pregnancy and other circumstances, your health care provider may suggest bed rest. If necessary, there are medicines that can be given to stop contractions and to mature the fetal lungs. If labor happens before 34 weeks of pregnancy, a prolonged hospital stay may be recommended. Treatment depends on the condition of both you and the fetus.  °WHAT SHOULD YOU DO IF YOU THINK YOU ARE IN PRETERM LABOR? °Call your health care provider right away. You will need to go to the hospital to get checked immediately. °HOW CAN YOU PREVENT PRETERM LABOR IN FUTURE PREGNANCIES? °You should:  °· Stop smoking if you smoke.  °· Maintain healthy weight gain and avoid chemicals and drugs that are not necessary. °· Be watchful for any type of infection. °· Inform your health care provider if you have a known history of preterm labor. °Document Released: 06/26/2003 Document Revised: 12/06/2012 Document Reviewed: 05/08/2012 °ExitCare® Patient Information ©2015 [employer]. This information is not intended to replace advice given to you by your health care provider. Make sure you discuss any questions you have with your health care provider. ° °Pelvic Rest °Pelvic rest is sometimes recommended for women when:  °· The placenta is partially or completely covering the opening of the cervix (placenta previa). °· There is bleeding between the uterine wall and the amniotic sac in the first trimester (subchorionic hemorrhage). °· The cervix begins to open without labor starting (incompetent cervix, cervical insufficiency). °· The labor is too early (preterm   labor). °HOME CARE INSTRUCTIONS °· Do not have sexual intercourse, stimulation, or an orgasm. °· Do not use tampons, douche, or put anything in the vagina. °· Do not lift anything over 10 pounds (4.5 kg). °· Avoid strenuous activity or straining your pelvic muscles. °SEEK MEDICAL CARE IF:  °· You have any vaginal bleeding  during pregnancy. Treat this as a potential emergency. °· You have cramping pain felt low in the stomach (stronger than menstrual cramps). °· You notice vaginal discharge (watery, mucus, or bloody). °· You have a low, dull backache. °· There are regular contractions or uterine tightening. °SEEK IMMEDIATE MEDICAL CARE IF: °You have vaginal bleeding and have placenta previa.  °Document Released: 07/31/2010 Document Revised: 06/28/2011 Document Reviewed: 07/31/2010 °ExitCare® Patient Information ©2015 [employer]. This information is not intended to replace advice given to you by your health care provider. Make sure you discuss any questions you have with your health care provider. ° °

## 2014-07-31 NOTE — MAU Note (Signed)
Pt sent from MD office for PTL - 5/70.  Denies LOF or bleeding, has orange discharge.  C/O abd & back pain with uc's, also pelvic pressure.

## 2014-08-02 ENCOUNTER — Encounter (HOSPITAL_COMMUNITY): Payer: Self-pay

## 2014-08-02 ENCOUNTER — Telehealth: Payer: Self-pay | Admitting: Obstetrics and Gynecology

## 2014-08-02 ENCOUNTER — Inpatient Hospital Stay (EMERGENCY_DEPARTMENT_HOSPITAL)
Admission: RE | Admit: 2014-08-02 | Discharge: 2014-08-02 | Disposition: A | Discharge status: Home / Self Care | Payer: 59 | Source: Ambulatory Visit | Attending: Obstetrics & Gynecology | Admitting: Obstetrics & Gynecology

## 2014-08-02 DIAGNOSIS — K219 Gastro-esophageal reflux disease without esophagitis: Secondary | ICD-10-CM | POA: Diagnosis present

## 2014-08-02 DIAGNOSIS — O99824 Streptococcus B carrier state complicating childbirth: Secondary | ICD-10-CM | POA: Diagnosis present

## 2014-08-02 DIAGNOSIS — Z3A31 31 weeks gestation of pregnancy: Secondary | ICD-10-CM | POA: Insufficient documentation

## 2014-08-02 DIAGNOSIS — O9989 Other specified diseases and conditions complicating pregnancy, childbirth and the puerperium: Secondary | ICD-10-CM | POA: Diagnosis not present

## 2014-08-02 DIAGNOSIS — O09893 Supervision of other high risk pregnancies, third trimester: Secondary | ICD-10-CM

## 2014-08-02 DIAGNOSIS — Z3A32 32 weeks gestation of pregnancy: Secondary | ICD-10-CM | POA: Diagnosis present

## 2014-08-02 DIAGNOSIS — O1092 Unspecified pre-existing hypertension complicating childbirth: Secondary | ICD-10-CM | POA: Diagnosis present

## 2014-08-02 DIAGNOSIS — K861 Other chronic pancreatitis: Secondary | ICD-10-CM

## 2014-08-02 DIAGNOSIS — O99334 Smoking (tobacco) complicating childbirth: Secondary | ICD-10-CM | POA: Diagnosis present

## 2014-08-02 DIAGNOSIS — F1190 Opioid use, unspecified, uncomplicated: Secondary | ICD-10-CM | POA: Diagnosis present

## 2014-08-02 DIAGNOSIS — F17210 Nicotine dependence, cigarettes, uncomplicated: Secondary | ICD-10-CM | POA: Diagnosis present

## 2014-08-02 DIAGNOSIS — R109 Unspecified abdominal pain: Secondary | ICD-10-CM

## 2014-08-02 DIAGNOSIS — O4593 Premature separation of placenta, unspecified, third trimester: Principal | ICD-10-CM | POA: Diagnosis present

## 2014-08-02 DIAGNOSIS — O9962 Diseases of the digestive system complicating childbirth: Secondary | ICD-10-CM | POA: Diagnosis present

## 2014-08-02 DIAGNOSIS — F1290 Cannabis use, unspecified, uncomplicated: Secondary | ICD-10-CM | POA: Diagnosis present

## 2014-08-02 DIAGNOSIS — O99324 Drug use complicating childbirth: Secondary | ICD-10-CM | POA: Diagnosis not present

## 2014-08-02 DIAGNOSIS — O26899 Other specified pregnancy related conditions, unspecified trimester: Secondary | ICD-10-CM

## 2014-08-02 LAB — URINE MICROSCOPIC-ADD ON

## 2014-08-02 LAB — URINALYSIS, ROUTINE W REFLEX MICROSCOPIC
Bilirubin Urine: NEGATIVE
Glucose, UA: 500 mg/dL — AB
KETONES UR: NEGATIVE mg/dL
Nitrite: NEGATIVE
PROTEIN: NEGATIVE mg/dL
Specific Gravity, Urine: >1.03 — ABNORMAL HIGH (ref 1.005–1.030)
UROBILINOGEN UA: 0.2 mg/dL (ref 0.0–1.0)
pH: 6 (ref 5.0–8.0)

## 2014-08-02 LAB — LIPASE, BLOOD: Lipase: 109 U/L — ABNORMAL HIGH (ref 11–59)

## 2014-08-02 LAB — AMYLASE: Amylase: 143 U/L — ABNORMAL HIGH (ref 0–105)

## 2014-08-02 MED ORDER — ONDANSETRON 4 MG PO TBDP: 8.0000 mg | ORAL_TABLET | Freq: Three times a day (TID) | ORAL | Status: DC | PRN

## 2014-08-02 MED ORDER — TERBUTALINE SULFATE 1 MG/ML IJ SOLN
0.2500 mg | Freq: Once | INTRAMUSCULAR | Status: AC
  Administered 2014-08-02: 0.25 mg via SUBCUTANEOUS
  Filled 2014-08-02: qty 1

## 2014-08-02 MED ORDER — FENTANYL CITRATE (PF) 100 MCG/2ML IJ SOLN
100.0000 ug | Freq: Once | INTRAMUSCULAR | Status: AC
Start: 2014-08-02 — End: 2014-08-02
  Administered 2014-08-02: 100 ug via INTRAVENOUS
  Filled 2014-08-02: qty 2

## 2014-08-02 MED ORDER — OXYCODONE HCL 10 MG PO TABS: 10.0000 mg | ORAL_TABLET | Freq: Three times a day (TID) | ORAL | Status: DC | PRN

## 2014-08-02 MED ORDER — LACTATED RINGERS IV BOLUS (SEPSIS)
1000.0000 mL | Freq: Once | INTRAVENOUS | Status: AC
  Administered 2014-08-02: 1000 mL via INTRAVENOUS

## 2014-08-02 MED ORDER — ONDANSETRON 8 MG PO TBDP
8.0000 mg | ORAL_TABLET | Freq: Once | ORAL | Status: AC
  Administered 2014-08-02: 8 mg via ORAL
  Filled 2014-08-02: qty 1

## 2014-08-02 MED ORDER — HYDROMORPHONE HCL 1 MG/ML IJ SOLN
1.0000 mg | INTRAMUSCULAR | Status: DC | PRN
  Administered 2014-08-02: 2 mg via INTRAVENOUS
  Filled 2014-08-02: qty 2

## 2014-08-02 NOTE — Discharge Instructions (Signed)
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
## 2014-08-02 NOTE — Telephone Encounter (Signed)
Pt states that she has had a bloody discharge since last night. Pt denies any gush of fluid. Pt states that she has had fetal movement. Pt states that she is having cramping on top of the contractions that she has been having. Pt states that contractions about every 10 minutes. Pt on procardia.   I spoke with Dr. Emelda FearFerguson and he advised [facility]. Pt verbalized understanding.

## 2014-08-02 NOTE — MAU Note (Addendum)
Started with some bloody mucous last nigh, still spottingt.  Has started cramping, contractions have kicked in.  Took procardia at 10   Was 5 cm on Wed

## 2014-08-03 DIAGNOSIS — O99324 Drug use complicating childbirth: Secondary | ICD-10-CM | POA: Diagnosis not present

## 2014-08-03 DIAGNOSIS — K861 Other chronic pancreatitis: Secondary | ICD-10-CM | POA: Diagnosis not present

## 2014-08-03 DIAGNOSIS — O4593 Premature separation of placenta, unspecified, third trimester: Secondary | ICD-10-CM | POA: Diagnosis not present

## 2014-08-04 ENCOUNTER — Inpatient Hospital Stay (HOSPITAL_COMMUNITY): Payer: 59 | Admitting: Anesthesiology

## 2014-08-04 ENCOUNTER — Inpatient Hospital Stay (HOSPITAL_COMMUNITY)
Admission: AD | Admit: 2014-08-04 | Discharge: 2014-08-06 | DRG: 774 | Disposition: A | Discharge status: Home / Self Care | Payer: 59 | Source: Ambulatory Visit | Attending: Obstetrics and Gynecology | Admitting: Obstetrics and Gynecology

## 2014-08-04 ENCOUNTER — Encounter (HOSPITAL_COMMUNITY): Payer: Self-pay

## 2014-08-04 DIAGNOSIS — O09893 Supervision of other high risk pregnancies, third trimester: Secondary | ICD-10-CM

## 2014-08-04 DIAGNOSIS — F1290 Cannabis use, unspecified, uncomplicated: Secondary | ICD-10-CM | POA: Diagnosis present

## 2014-08-04 DIAGNOSIS — F17210 Nicotine dependence, cigarettes, uncomplicated: Secondary | ICD-10-CM | POA: Diagnosis present

## 2014-08-04 DIAGNOSIS — Z8759 Personal history of other complications of pregnancy, childbirth and the puerperium: Secondary | ICD-10-CM | POA: Clinically undetermined

## 2014-08-04 DIAGNOSIS — K861 Other chronic pancreatitis: Secondary | ICD-10-CM | POA: Diagnosis present

## 2014-08-04 DIAGNOSIS — O99324 Drug use complicating childbirth: Secondary | ICD-10-CM | POA: Diagnosis present

## 2014-08-04 DIAGNOSIS — O9962 Diseases of the digestive system complicating childbirth: Secondary | ICD-10-CM | POA: Diagnosis present

## 2014-08-04 DIAGNOSIS — O4593 Premature separation of placenta, unspecified, third trimester: Secondary | ICD-10-CM | POA: Diagnosis present

## 2014-08-04 DIAGNOSIS — O99334 Smoking (tobacco) complicating childbirth: Secondary | ICD-10-CM | POA: Diagnosis present

## 2014-08-04 DIAGNOSIS — F1190 Opioid use, unspecified, uncomplicated: Secondary | ICD-10-CM | POA: Diagnosis present

## 2014-08-04 DIAGNOSIS — IMO0001 Reserved for inherently not codable concepts without codable children: Secondary | ICD-10-CM

## 2014-08-04 DIAGNOSIS — O99824 Streptococcus B carrier state complicating childbirth: Secondary | ICD-10-CM | POA: Diagnosis present

## 2014-08-04 DIAGNOSIS — K219 Gastro-esophageal reflux disease without esophagitis: Secondary | ICD-10-CM | POA: Diagnosis present

## 2014-08-04 DIAGNOSIS — Z3A32 32 weeks gestation of pregnancy: Secondary | ICD-10-CM | POA: Diagnosis present

## 2014-08-04 DIAGNOSIS — O1092 Unspecified pre-existing hypertension complicating childbirth: Secondary | ICD-10-CM | POA: Diagnosis present

## 2014-08-04 LAB — CBC
HCT: 36 % (ref 36.0–46.0)
Hemoglobin: 12.1 g/dL (ref 12.0–15.0)
MCH: 30.4 pg (ref 26.0–34.0)
MCHC: 33.6 g/dL (ref 30.0–36.0)
MCV: 90.5 fL (ref 78.0–100.0)
Platelets: 271 10*3/uL (ref 150–400)
RBC: 3.98 MIL/uL (ref 3.87–5.11)
RDW: 14.6 % (ref 11.5–15.5)
WBC: 14.9 10*3/uL — ABNORMAL HIGH (ref 4.0–10.5)

## 2014-08-04 MED ORDER — FENTANYL CITRATE (PF) 100 MCG/2ML IJ SOLN: 50.0000 ug | INTRAMUSCULAR | Status: DC | PRN

## 2014-08-04 MED ORDER — PHENYLEPHRINE 40 MCG/ML (10ML) SYRINGE FOR IV PUSH (FOR BLOOD PRESSURE SUPPORT)
80.0000 ug | PREFILLED_SYRINGE | INTRAVENOUS | Status: DC | PRN
  Filled 2014-08-04: qty 2
  Filled 2014-08-04 (×2): qty 20

## 2014-08-04 MED ORDER — PENICILLIN G POTASSIUM 5000000 UNITS IJ SOLR
5.0000 10*6.[IU] | Freq: Once | INTRAVENOUS | Status: AC
  Administered 2014-08-04: 5 10*6.[IU] via INTRAVENOUS
  Filled 2014-08-04: qty 5

## 2014-08-04 MED ORDER — DIPHENHYDRAMINE HCL 50 MG/ML IJ SOLN: 12.5000 mg | INTRAMUSCULAR | Status: DC | PRN

## 2014-08-04 MED ORDER — OXYTOCIN 40 UNITS IN LACTATED RINGERS INFUSION - SIMPLE MED: 1.0000 m[IU]/min | INTRAVENOUS | Status: DC

## 2014-08-04 MED ORDER — SODIUM BICARBONATE 8.4 % IV SOLN
INTRAVENOUS | Status: DC | PRN
  Administered 2014-08-04 (×4): 5 mL via EPIDURAL

## 2014-08-04 MED ORDER — LIDOCAINE HCL (PF) 1 % IJ SOLN
INTRAMUSCULAR | Status: DC | PRN
  Administered 2014-08-04 (×2): 8 mL

## 2014-08-04 MED ORDER — ONDANSETRON HCL 4 MG/2ML IJ SOLN: 4.0000 mg | Freq: Four times a day (QID) | INTRAMUSCULAR | Status: DC | PRN

## 2014-08-04 MED ORDER — OXYTOCIN BOLUS FROM INFUSION
500.0000 mL | INTRAVENOUS | Status: DC
  Administered 2014-08-04: 500 mL via INTRAVENOUS

## 2014-08-04 MED ORDER — OXYTOCIN 40 UNITS IN LACTATED RINGERS INFUSION - SIMPLE MED
62.5000 mL/h | INTRAVENOUS | Status: DC
  Filled 2014-08-04: qty 1000

## 2014-08-04 MED ORDER — PHENYLEPHRINE 40 MCG/ML (10ML) SYRINGE FOR IV PUSH (FOR BLOOD PRESSURE SUPPORT)
80.0000 ug | PREFILLED_SYRINGE | INTRAVENOUS | Status: DC | PRN
  Administered 2014-08-04: 80 ug via INTRAVENOUS
  Filled 2014-08-04: qty 2

## 2014-08-04 MED ORDER — ACETAMINOPHEN 325 MG PO TABS: 650.0000 mg | ORAL_TABLET | ORAL | Status: DC | PRN

## 2014-08-04 MED ORDER — LACTATED RINGERS IV SOLN
500.0000 mL | Freq: Once | INTRAVENOUS | Status: AC
  Administered 2014-08-04: 500 mL via INTRAVENOUS

## 2014-08-04 MED ORDER — OXYCODONE-ACETAMINOPHEN 5-325 MG PO TABS: 1.0000 | ORAL_TABLET | ORAL | Status: DC | PRN

## 2014-08-04 MED ORDER — LACTATED RINGERS IV SOLN
INTRAVENOUS | Status: DC
  Administered 2014-08-04: 15:00:00 via INTRAVENOUS

## 2014-08-04 MED ORDER — EPHEDRINE 5 MG/ML INJ
10.0000 mg | INTRAVENOUS | Status: DC | PRN
  Filled 2014-08-04: qty 2

## 2014-08-04 MED ORDER — TERBUTALINE SULFATE 1 MG/ML IJ SOLN: 0.2500 mg | Freq: Once | INTRAMUSCULAR | Status: AC | PRN

## 2014-08-04 MED ORDER — LIDOCAINE HCL (PF) 1 % IJ SOLN
30.0000 mL | INTRAMUSCULAR | Status: DC | PRN
  Administered 2014-08-04: 30 mL via SUBCUTANEOUS
  Filled 2014-08-04: qty 30

## 2014-08-04 MED ORDER — FENTANYL 2.5 MCG/ML BUPIVACAINE 1/10 % EPIDURAL INFUSION (WH - ANES)
14.0000 mL/h | INTRAMUSCULAR | Status: DC | PRN
  Administered 2014-08-04 (×3): 14 mL/h via EPIDURAL
  Filled 2014-08-04 (×2): qty 125

## 2014-08-04 MED ORDER — OXYCODONE-ACETAMINOPHEN 5-325 MG PO TABS: 2.0000 | ORAL_TABLET | ORAL | Status: DC | PRN

## 2014-08-04 MED ORDER — IBUPROFEN 600 MG PO TABS
600.0000 mg | ORAL_TABLET | Freq: Four times a day (QID) | ORAL | Status: DC
  Administered 2014-08-04 – 2014-08-06 (×6): 600 mg via ORAL
  Filled 2014-08-04 (×6): qty 1

## 2014-08-04 MED ORDER — CITRIC ACID-SODIUM CITRATE 334-500 MG/5ML PO SOLN: 30.0000 mL | ORAL | Status: DC | PRN

## 2014-08-04 MED ORDER — PENICILLIN G POTASSIUM 5000000 UNITS IJ SOLR
2.5000 10*6.[IU] | INTRAVENOUS | Status: DC
  Administered 2014-08-04 (×2): 2.5 10*6.[IU] via INTRAVENOUS
  Filled 2014-08-04 (×7): qty 2.5

## 2014-08-04 MED ORDER — LACTATED RINGERS IV SOLN: 500.0000 mL | INTRAVENOUS | Status: DC | PRN
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
## 2014-08-04 NOTE — Anesthesia Procedure Notes (Signed)
Epidural Patient location during procedure: OB Start time: 08/04/2014 11:24 AM End time: 08/04/2014 11:28 AM  Staffing Anesthesiologist: Leilani AbleHATCHETT, Carson Bogden  Preanesthetic Checklist Completed: patient identified, surgical consent, pre-op evaluation, timeout performed, IV checked, risks and benefits discussed and monitors and equipment checked  Epidural Patient position: sitting Prep: site prepped and draped and DuraPrep Patient monitoring: continuous pulse ox and blood pressure Approach: midline Location: L3-L4 Injection technique: LOR air  Needle:  Needle type: Tuohy  Needle gauge: 17 G Needle length: 9 cm and 9 Needle insertion depth: 4 cm Catheter type: closed end flexible Catheter size: 19 Gauge Catheter at skin depth: 8 cm Test dose: negative and Other  Assessment Sensory level: T9 Events: blood not aspirated, injection not painful, no injection resistance, negative IV test and no paresthesia  Additional Notes Reason for block:procedure for pain

## 2014-08-04 NOTE — Progress Notes (Signed)
Amber BirkKatie C Dunn is a 29 y.o. G2P0100 at 3173w0d admitted for rupture of membranes, Preterm labor  Subjective: Comfortable with epirual  Objective: BP 95/52 mmHg  Pulse 103  Temp(Src) 98.7 F (37.1 C) (Oral)  Resp 20  Ht 5\' 4"  (1.626 m)  Wt 68.947 kg (152 lb)  BMI 26.08 kg/m2  SpO2 100%  LMP 12/09/2013 Total I/O In: -  Out: 500 [Urine:500]  FHT:  FHR: 135 bpm, variability: moderate,  accelerations:  Present,  decelerations:  Absent UC:   regular, every 6 minutes  SVE:   6.5 per RN   Labs: Lab Results  Component Value Date   WBC 14.9* 08/04/2014   HGB 12.1 08/04/2014   HCT 36.0 08/04/2014   MCV 90.5 08/04/2014   PLT 271 08/04/2014    Assessment / Plan: Spontaneous labor, progressing normally  Labor: Progressing normally, continue expectant management Fetal Wellbeing:  Category I Pain Control:  Epidural Pre-eclampsia: no s/s I/D:  GBS pos Anticipated MOD:  NSVD  Ross,Lisa Wynne SNM 08/04/2014, 7:49 PM  D Hart RochesterLawson cnm

## 2014-08-04 NOTE — Consult Note (Signed)
Neonatology Consult  Note:  At the request of the patients obstetrician Dr. Ferguson I met with [name] who is at 32 weeks currently with pregnancy complicated by preterm labor.  She had been previously hospitalized for preterm labor and I had talked with her when she was 30 weeks.  She presented today for presenting with bloody show and contractions.   S/p betamethasone on 4/6/-7.  On antibiotics for GBS prophylaxis and was having contractions while I was talking with her.   We discussed much of the same information as per our discussion two weeks ago, however chances of complications have decreased now that she has progressed another two weeks.    We reviewed initial delivery room management, including CPAP, [place], and low but certainly possible need for intubation for surfactant administration.  We discussed feeding immaturity and need for full po intake with multiple days of good weight gain and no apnea or bradycardia before discharge.  We reviewed increased risk of jaundice, infection, and temperature instability.   Discussed likely length of stay.  Thank you for allowing us to participate in her care.     , DO  Neonatologist  The total length of face-to-face or floor / unit time for this encounter was 20 minutes.  Counseling and / or coordination of care was greater than fifty percent of the time.     

## 2014-08-04 NOTE — Anesthesia Preprocedure Evaluation (Signed)
Anesthesia Evaluation  Patient identified by MRN, date of birth, ID band Patient awake    Reviewed: Allergy & Precautions, H&P , NPO status , Patient's Chart, lab work & pertinent test results  Airway Mallampati: I  TM Distance: >3 FB Neck ROM: full    Dental no notable dental hx.    Pulmonary Current Smoker,    Pulmonary exam normal       Cardiovascular hypertension, Pt. on medications     Neuro/Psych negative neurological ROS     GI/Hepatic Neg liver ROS, GERD-  Medicated and Controlled,  Endo/Other  negative endocrine ROS  Renal/GU negative Renal ROS     Musculoskeletal   Abdominal Normal abdominal exam  (+)   Peds  Hematology   Anesthesia Other Findings   Reproductive/Obstetrics (+) Pregnancy                             Anesthesia Physical Anesthesia Plan  ASA: II  Anesthesia Plan: Epidural   Post-op Pain Management:    Induction:   Airway Management Planned:   Additional Equipment:   Intra-op Plan:   Post-operative Plan:   Informed Consent: I have reviewed the patients History and Physical, chart, labs and discussed the procedure including the risks, benefits and alternatives for the proposed anesthesia with the patient or authorized representative who has indicated his/her understanding and acceptance.     Plan Discussed with:   Anesthesia Plan Comments:         Anesthesia Quick Evaluation

## 2014-08-04 NOTE — MAU Note (Signed)
Pt here for vaginal bleeding. Has been admitted for PTL. Was 4.5-5 last exam.

## 2014-08-05 ENCOUNTER — Encounter (HOSPITAL_COMMUNITY): Payer: Self-pay | Admitting: *Deleted

## 2014-08-05 DIAGNOSIS — O4593 Premature separation of placenta, unspecified, third trimester: Secondary | ICD-10-CM

## 2014-08-05 DIAGNOSIS — K861 Other chronic pancreatitis: Secondary | ICD-10-CM

## 2014-08-05 DIAGNOSIS — O99324 Drug use complicating childbirth: Secondary | ICD-10-CM

## 2014-08-05 LAB — CBC
HEMATOCRIT: 30.5 % — AB (ref 36.0–46.0)
HEMOGLOBIN: 9.9 g/dL — AB (ref 12.0–15.0)
MCH: 29.4 pg (ref 26.0–34.0)
MCHC: 32.5 g/dL (ref 30.0–36.0)
MCV: 90.5 fL (ref 78.0–100.0)
Platelets: 228 10*3/uL (ref 150–400)
RBC: 3.37 MIL/uL — ABNORMAL LOW (ref 3.87–5.11)
RDW: 14.4 % (ref 11.5–15.5)
WBC: 14.2 10*3/uL — ABNORMAL HIGH (ref 4.0–10.5)

## 2014-08-05 LAB — RPR: RPR Ser Ql: NONREACTIVE

## 2014-08-05 LAB — TYPE AND SCREEN
ABO/RH(D): A NEG
ANTIBODY SCREEN: NEGATIVE

## 2014-08-05 LAB — HIV ANTIBODY (ROUTINE TESTING W REFLEX): HIV SCREEN 4TH GENERATION: NONREACTIVE

## 2014-08-05 MED ORDER — PRENATAL MULTIVITAMIN CH
1.0000 | ORAL_TABLET | Freq: Every day | ORAL | Status: DC
  Administered 2014-08-05: 1 via ORAL
  Filled 2014-08-05: qty 1

## 2014-08-05 MED ORDER — ONDANSETRON HCL 4 MG PO TABS: 4.0000 mg | ORAL_TABLET | ORAL | Status: DC | PRN

## 2014-08-05 MED ORDER — ONDANSETRON HCL 4 MG/2ML IJ SOLN: 4.0000 mg | INTRAMUSCULAR | Status: DC | PRN

## 2014-08-05 MED ORDER — BENZOCAINE-MENTHOL 20-0.5 % EX AERO: 1.0000 "application " | INHALATION_SPRAY | CUTANEOUS | Status: DC | PRN

## 2014-08-05 MED ORDER — DIPHENHYDRAMINE HCL 25 MG PO CAPS: 25.0000 mg | ORAL_CAPSULE | Freq: Four times a day (QID) | ORAL | Status: DC | PRN

## 2014-08-05 MED ORDER — SENNOSIDES-DOCUSATE SODIUM 8.6-50 MG PO TABS
2.0000 | ORAL_TABLET | ORAL | Status: DC
  Administered 2014-08-05 (×2): 2 via ORAL
  Filled 2014-08-05 (×2): qty 2

## 2014-08-05 MED ORDER — OXYCODONE-ACETAMINOPHEN 5-325 MG PO TABS
2.0000 | ORAL_TABLET | ORAL | Status: DC | PRN
  Administered 2014-08-05 – 2014-08-06 (×6): 2 via ORAL
  Filled 2014-08-05 (×6): qty 2

## 2014-08-05 MED ORDER — TETANUS-DIPHTH-ACELL PERTUSSIS 5-2.5-18.5 LF-MCG/0.5 IM SUSP
0.5000 mL | Freq: Once | INTRAMUSCULAR | Status: AC
  Administered 2014-08-05: 0.5 mL via INTRAMUSCULAR
  Filled 2014-08-05: qty 0.5

## 2014-08-05 MED ORDER — OXYCODONE-ACETAMINOPHEN 5-325 MG PO TABS
1.0000 | ORAL_TABLET | ORAL | Status: DC | PRN
  Administered 2014-08-05 (×2): 1 via ORAL
  Filled 2014-08-05 (×2): qty 1

## 2014-08-05 MED ORDER — WITCH HAZEL-GLYCERIN EX PADS: 1.0000 "application " | MEDICATED_PAD | CUTANEOUS | Status: DC | PRN

## 2014-08-05 MED ORDER — ZOLPIDEM TARTRATE 5 MG PO TABS
5.0000 mg | ORAL_TABLET | Freq: Every evening | ORAL | Status: DC | PRN
  Administered 2014-08-05 (×2): 5 mg via ORAL
  Filled 2014-08-05 (×2): qty 1

## 2014-08-05 MED ORDER — LANOLIN HYDROUS EX OINT: TOPICAL_OINTMENT | CUTANEOUS | Status: DC | PRN

## 2014-08-05 MED ORDER — SIMETHICONE 80 MG PO CHEW: 80.0000 mg | CHEWABLE_TABLET | ORAL | Status: DC | PRN

## 2014-08-05 MED ORDER — DIBUCAINE 1 % RE OINT: 1.0000 "application " | TOPICAL_OINTMENT | RECTAL | Status: DC | PRN

## 2014-08-05 MED ORDER — ACETAMINOPHEN 325 MG PO TABS: 650.0000 mg | ORAL_TABLET | ORAL | Status: DC | PRN

## 2014-08-05 NOTE — Progress Notes (Signed)
Ur chart review completed.  

## 2014-08-05 NOTE — Progress Notes (Signed)
Post Partum Day 1 Subjective: up ad lib, voiding, tolerating PO, + flatus and cramping  Objective: Blood pressure 126/81, pulse 107, temperature 98.1 F (36.7 C), temperature source Oral, resp. rate 18, height 5\' 4"  (1.626 m), weight 152 lb (68.947 kg), last menstrual period 12/09/2013, SpO2 100 %, unknown if currently breastfeeding.  Physical Exam:  General: alert, cooperative, appears older than stated age and mild distress Lochia: appropriate Uterine Fundus: firm Incision: NA DVT Evaluation: Negative Homan's sign.   Recent Labs  08/04/14 1015 08/05/14 0530  HGB 12.1 9.9*  HCT 36.0 30.5*    Assessment/Plan: Plan for discharge tomorrow, Circumcision prior to discharge and Contraception Undecided  Encouraged to pump   LOS: 1 day   Dorathy KinsmanSMITH, Avaley Coop 08/05/2014, 3:31 PM

## 2014-08-06 MED ORDER — IBUPROFEN 600 MG PO TABS: 600.0000 mg | ORAL_TABLET | Freq: Four times a day (QID) | ORAL | Status: DC

## 2014-08-06 MED ORDER — ACETAMINOPHEN-CODEINE 300-30 MG PO TABS: 1.0000 | ORAL_TABLET | ORAL | Status: DC | PRN

## 2014-08-06 NOTE — Discharge Instructions (Signed)

## 2014-08-06 NOTE — Progress Notes (Signed)
Pt is discharged in the care of friend,with R.N.escort. Denies any pain or  Discomfort or excessive bleeding.No equipment needed for home use Discharge instructions  with Rx were given to pt. Questions were asked and answered. Infant to remain in [place]. Aware of all instructions informed in care of infant and herself.

## 2014-08-06 NOTE — Lactation Note (Signed)
This note was copied from the chart of [name]. Lactation Consultation Note  Patient Name: [name] ONGEX'BToday's Date: 08/06/2014 Reason for consult: Initial assessment NICU baby 37 hours of life. Called to discuss pump rental with MOB. Enc mom to call her insurance company to discuss getting a pump. Discussed 2-week pump rental and benefits of hospital grade pump. Mom given paperwork for pump rental and she states that she will return later today to the BF store for rental when she has the money. Enc mom to continue pumping for 15 minutes, 8 times a day. Enc mom to take a longer break at night for sleeping. Mom aware of the NICU pumping rooms, OP/BFSG and LC phone line assistance after D/C. [facility].   Maternal Data Has patient been taught Hand Expression?: Yes (Per mom.) Does the patient have breastfeeding experience prior to this delivery?: No  Feeding Feeding Type: Formula  LATCH Score/Interventions                      Lactation Tools Discussed/Used     Consult Status Consult Status: PRN    Geralynn OchsWILLIARD, Glenys Snader 08/06/2014, 11:45 AM

## 2014-08-06 NOTE — Discharge Summary (Signed)
Obstetric Discharge Summary Reason for Admission: rupture of membranes Prenatal Procedures: ultrasound Intrapartum Procedures: spontaneous vaginal delivery Postpartum Procedures: none Complications-Operative and Postpartum: none  At 10:13 PM a viable female [name]) was delivered via (Presentation: OA). APGAR: 8,8 ; weight 4 lb 10.4 oz (2110 g). Dr Sherryl Manges was gloved and assisted with the entirety of the delivery. Approximately 250cc of dark red blood clots were expelled prior to delivery of baby. After delivery of placenta, fundal massage applied and vagina inspected for lacerations. Placenta status: partial abruption. Cord: 3 vessel with the following complications: none. Cord pH: pending  NICU was present for the entirety of the delivery. Infant placed on mother's abdomen briefly. Cord clamped and cut by FOB. Baby was immediately transferred to the care of NICU providers. Hospital cord blood sample collected. Blood gas also collected and is pending.  Anesthesia: Epidural  Episiotomy: n/a  Lacerations: none Suture Repair: n/a Est. Blood Loss (mL): 500cc   Mom to postpartum. Baby to Couplet care / Skin to Skin.   Hospital Course:  Active Problems:   NSVD (normal spontaneous vaginal delivery)   Postpartum hemorrhage   Placental abruption, with delivery   [name] is a 29 y.o. [id number] s/p SVD.  Patient was originally admitted at [redacted]w[redacted]d and 31 wks for preterm contractions. MgSO4 and BMX were administered and the patient was sent home with Procardia. Patient was admitted for this stay on 08/04/14 for bloody show and contractions consistent with preterm labor.  She has postpartum course that was uncomplicated including no problems with ambulating, PO intake, urination, or bleeding. The pt feels ready to go home and  will be discharged with outpatient follow-up.   Today: No acute events overnight.  Pt denies problems with ambulating, voiding or po intake.  She denies  nausea or vomiting.  Pain is moderately controlled.  Lochia Minimal.  Plan for birth control is  undecided.  Method of Feeding: Breast (currently pumping)  Physical Exam:  General: alert, cooperative and no distress Lochia: appropriate DVT Evaluation: No evidence of DVT seen on physical exam. No significant calf/ankle edema.  H/H: Lab Results  Component Value Date/Time   HGB 9.9* 08/05/2014 05:30 AM   HCT 30.5* 08/05/2014 05:30 AM    [name] is a 29 y.o. G2P0201 s/p SVD admitted for preterm labor who is currently stable and ready to go home  Discharge Diagnoses: Premature labor  Discharge Information: Date: 08/06/2014 Activity: pelvic rest Diet: routine  Medications: PNV Breast feeding:  Yes Condition: stable Instructions: refer to handout Discharge to: home   Eyvonne Mechanic, Med Student 08/06/2014,8:35 AM  I examined pt and agree with documentation above.  Subjective: No problems or concerns. Patient states doing well. Reports minimal bleeding and pain controlled with pain medications. Requests oxycodone for pain relief at home (perineum and cramping).  Denies calf pain.  Bottlefeeding/breastfeeding.  Desires to see lactation consultant prior to discharge.  Infant still in NICU, may need to remain for two more weeks.  Objective: Blood pressure 122/79, pulse 3, temperature 98 F (36.7 C), temperature source Oral, resp. rate 20, height  (1.473 m), weight 55.792 kg (123 lb), last menstrual period 02/21/2010, SpO2 96.00%.  Physical Exam:  General: alert, cooperative, appears older than stated age and no distress Lochia: appropriate Uterine Fundus: firm, at umbilicus DVT Evaluation: No evidence of DVT seen on physical exam. Negative Homan's sign.  Assessmens t/Plan: Discharge home Explained can give Ibuprofen and Tylenol#3 for pain; however oxycodone more than typically  given for vaginal delivery.  Eino FarberWalidah Kennith GainN Karim, CNM

## 2014-08-07 ENCOUNTER — Encounter: Payer: 59 | Admitting: Obstetrics and Gynecology

## 2014-08-09 ENCOUNTER — Ambulatory Visit: Payer: Self-pay

## 2014-08-09 NOTE — Lactation Note (Signed)
This note was copied from the chart of Boy Karie FetchKatie Butrum. Lactation Consultation Note  Patient Name: Boy Karie FetchKatie Asquith JYNWG'NToday's Date: 08/09/2014 Reason for consult: Follow-up assessment;NICU baby NICU baby 5 days of life. Mom holding baby at baby's bedside while baby being gavage-fed. Mom states that she spends most of the day holding baby, and does offer STS often, pumping right after baby's feeding in order to be on baby's feeding schedule for when baby comes home. Mom states that her milk is coming in well now, and that she pumps once or twice at night, then every 2.5-3 hours during the day. Enc mom to call for assistance as needed.   Maternal Data    Feeding Feeding Type: Breast Milk Length of feed: 30 min  LATCH Score/Interventions                      Lactation Tools Discussed/Used     Consult Status Consult Status: PRN    Geralynn OchsWILLIARD, Kingstin Heims 08/09/2014, 12:02 PM

## 2014-08-12 ENCOUNTER — Ambulatory Visit: Payer: Self-pay

## 2014-08-12 NOTE — Lactation Note (Signed)
This note was copied from the chart of Boy Karie FetchKatie Acord. Lactation Consultation Note  Patient Name: Boy Karie FetchKatie Modi ZOXWR'UToday's Date: 08/12/2014 Reason for consult: Follow-up assessment  With this mom of a NICU baby, now 688 days old and 33 1/7 weeks CGA. Mom is doing well with her milk supply. She is a cigarette smoker, so I told her how this will decrease her letdown, so to not smoke prior to pumping. Mom also did not know how to hand express, so I taught mom how to do this. Her milk was flowing in a steady stream, and she was getting ready to pump. Mom knows to call for questions/concerns.    Maternal Data    Feeding Feeding Type: Breast Milk Length of feed: 30 min  LATCH Score/Interventions                      Lactation Tools Discussed/Used     Consult Status Consult Status: PRN Follow-up type: In-patient (NICU)    Alfred LevinsLee, Rachel Rison Anne 08/12/2014, 1:09 PM

## 2014-08-20 ENCOUNTER — Ambulatory Visit: Payer: Self-pay

## 2014-08-20 NOTE — Lactation Note (Signed)
This note was copied from the chart of Boy Karie FetchKatie Revuelta. Lactation Consultation Note  Patient Name: Boy Karie FetchKatie Radich HQION'GToday's Date: 08/20/2014 Reason for consult: Follow-up assessment  With this mom and NICU baby, now 522 weeks old and 34 2/7 week CGA. I assisted mom with lataching baby for the first time. He did well with a 16 nipple shiied, strong suckles and milk seen in the shield after latch. He was at the breast for about 15-20 minutes, the last few minutes actually sucking. The 20 shield ws too large for the baby, and he latched without shiied, but was not able to maintain latch. I had mom do skin to skin after attempt - baby's respirations were increasing, and oxygen saturations dipped into the 80's  For a few second's. I explained to mom we do not want to stress him, and she understood. Mom very pleased with the feeding. She plans to begin galctagouges to increase her milk supply   Maternal Data    Feeding Feeding Type: Breast Fed Nipple Type: Slow - flow Length of feed: 20 min  LATCH Score/Interventions Latch: Repeated attempts needed to sustain latch, nipple held in mouth throughout feeding, stimulation needed to elicit sucking reflex. (20 nippe shiel too laarge for baby, he did well with 16 nipple shield) Intervention(s): Adjust position;Assist with latch;Breast compression  Audible Swallowing: None  Type of Nipple: Everted at rest and after stimulation (very soft - baby not able to maintain latch until 16 nipple shield applied )  Comfort (Breast/Nipple): Soft / non-tender     Hold (Positioning): Assistance needed to correctly position infant at breast and maintain latch. Intervention(s): Breastfeeding basics reviewed;Support Pillows;Position options;Skin to skin  LATCH Score: 6  Lactation Tools Discussed/Used Tools: Nipple Shields Nipple shield size: 16;20   Consult Status Consult Status: PRN Follow-up type: In-patient (NICU)    Alfred LevinsLee, Shereka Lafortune Anne 08/20/2014, 6:43  PM

## 2014-08-21 ENCOUNTER — Encounter (HOSPITAL_COMMUNITY)
Admission: RE | Admit: 2014-08-21 | Discharge: 2014-08-21 | Disposition: A | Discharge status: Home / Self Care | Payer: 59 | Source: Ambulatory Visit | Attending: Obstetrics and Gynecology | Admitting: Obstetrics and Gynecology

## 2014-08-21 DIAGNOSIS — O923 Agalactia: Secondary | ICD-10-CM | POA: Diagnosis not present

## 2014-08-28 ENCOUNTER — Ambulatory Visit: Payer: Self-pay

## 2014-08-28 NOTE — Lactation Note (Addendum)
This note was copied from the chart of [name]. Lactation Consultation Note  Patient Name: [name] ZOXWR'UToday's Date: 08/28/2014 Reason for consult: Follow-up assessment;NICU baby  NICU baby 3 wk.o. Mom holding baby "[name]" and reading while baby receives last of his gavage/tube feeding. Mom reports that her supply has decreased to about 10 mls each pumping session. Mom states that she has been tired and has slept through her pumping times. Enc mom to sleep for a 5-hour period at night, then increase her pumping times in the morning, and then add a power pumping hour in the evening before sleeping. Discussed converting a sports bra for hands-free pumping. Enc mom to pump at least 8 times a day. Mom states that she is continuing to offer STS often in the NICU, and is using the pumping rooms in NICU afterwards.   Maternal Data    Feeding Feeding Type: Breast Milk Nipple Type: Slow - flow Length of feed: 30 min  LATCH Score/Interventions                      Lactation Tools Discussed/Used     Consult Status Consult Status: PRN    Geralynn OchsWILLIARD, Carmyn Hamm 08/28/2014, 10:01 AM

## 2014-09-17 ENCOUNTER — Ambulatory Visit: Payer: 59 | Admitting: Women's Health

## 2014-09-17 ENCOUNTER — Encounter: Payer: Self-pay | Admitting: Women's Health

## 2014-09-21 ENCOUNTER — Encounter (HOSPITAL_COMMUNITY)
Admission: RE | Admit: 2014-09-21 | Discharge: 2014-09-21 | Disposition: A | Discharge status: Home / Self Care | Payer: 59 | Source: Ambulatory Visit | Attending: Obstetrics and Gynecology | Admitting: Obstetrics and Gynecology

## 2014-09-21 DIAGNOSIS — O923 Agalactia: Secondary | ICD-10-CM | POA: Insufficient documentation

## 2014-10-10 ENCOUNTER — Encounter (HOSPITAL_COMMUNITY): Payer: Self-pay | Admitting: Emergency Medicine

## 2014-10-10 ENCOUNTER — Emergency Department (HOSPITAL_COMMUNITY)
Admission: EM | Admit: 2014-10-10 | Discharge: 2014-10-10 | Disposition: A | Discharge status: Home / Self Care | Payer: 59 | Attending: Emergency Medicine | Admitting: Emergency Medicine

## 2014-10-10 DIAGNOSIS — R748 Abnormal levels of other serum enzymes: Secondary | ICD-10-CM | POA: Insufficient documentation

## 2014-10-10 DIAGNOSIS — G8929 Other chronic pain: Secondary | ICD-10-CM | POA: Diagnosis not present

## 2014-10-10 DIAGNOSIS — I10 Essential (primary) hypertension: Secondary | ICD-10-CM | POA: Diagnosis not present

## 2014-10-10 DIAGNOSIS — Z8659 Personal history of other mental and behavioral disorders: Secondary | ICD-10-CM | POA: Insufficient documentation

## 2014-10-10 DIAGNOSIS — R112 Nausea with vomiting, unspecified: Secondary | ICD-10-CM | POA: Diagnosis not present

## 2014-10-10 DIAGNOSIS — Z720 Tobacco use: Secondary | ICD-10-CM | POA: Diagnosis not present

## 2014-10-10 DIAGNOSIS — Z8739 Personal history of other diseases of the musculoskeletal system and connective tissue: Secondary | ICD-10-CM | POA: Diagnosis not present

## 2014-10-10 DIAGNOSIS — K219 Gastro-esophageal reflux disease without esophagitis: Secondary | ICD-10-CM | POA: Diagnosis not present

## 2014-10-10 DIAGNOSIS — R1013 Epigastric pain: Secondary | ICD-10-CM | POA: Insufficient documentation

## 2014-10-10 DIAGNOSIS — Z79899 Other long term (current) drug therapy: Secondary | ICD-10-CM | POA: Insufficient documentation

## 2014-10-10 DIAGNOSIS — Z8619 Personal history of other infectious and parasitic diseases: Secondary | ICD-10-CM | POA: Insufficient documentation

## 2014-10-10 LAB — CBC WITH DIFFERENTIAL/PLATELET
Basophils Absolute: 0 10*3/uL (ref 0.0–0.1)
Basophils Relative: 0 % (ref 0–1)
Eosinophils Absolute: 0.3 10*3/uL (ref 0.0–0.7)
Eosinophils Relative: 3 % (ref 0–5)
HCT: 39.9 % (ref 36.0–46.0)
Hemoglobin: 12.9 g/dL (ref 12.0–15.0)
Lymphocytes Relative: 28 % (ref 12–46)
Lymphs Abs: 2.4 10*3/uL (ref 0.7–4.0)
MCH: 28.2 pg (ref 26.0–34.0)
MCHC: 32.3 g/dL (ref 30.0–36.0)
MCV: 87.3 fL (ref 78.0–100.0)
Monocytes Absolute: 0.6 10*3/uL (ref 0.1–1.0)
Monocytes Relative: 7 % (ref 3–12)
Neutro Abs: 5.3 10*3/uL (ref 1.7–7.7)
Neutrophils Relative %: 62 % (ref 43–77)
Platelets: 380 10*3/uL (ref 150–400)
RBC: 4.57 MIL/uL (ref 3.87–5.11)
RDW: 15.8 % — ABNORMAL HIGH (ref 11.5–15.5)
WBC: 8.6 10*3/uL (ref 4.0–10.5)

## 2014-10-10 LAB — COMPREHENSIVE METABOLIC PANEL
ALT: 13 U/L — ABNORMAL LOW (ref 14–54)
AST: 15 U/L (ref 15–41)
Albumin: 4.2 g/dL (ref 3.5–5.0)
Alkaline Phosphatase: 84 U/L (ref 38–126)
Anion gap: 8 (ref 5–15)
BUN: 9 mg/dL (ref 6–20)
CO2: 24 mmol/L (ref 22–32)
Calcium: 9.2 mg/dL (ref 8.9–10.3)
Chloride: 106 mmol/L (ref 101–111)
Creatinine, Ser: 0.54 mg/dL (ref 0.44–1.00)
GFR calc Af Amer: >60 mL/min (ref >60)
GFR calc non Af Amer: >60 mL/min (ref >60)
Glucose, Bld: 112 mg/dL — ABNORMAL HIGH (ref 65–99)
Potassium: 3.9 mmol/L (ref 3.5–5.1)
Sodium: 138 mmol/L (ref 135–145)
Total Bilirubin: 0.4 mg/dL (ref 0.3–1.2)
Total Protein: 7.4 g/dL (ref 6.5–8.1)

## 2014-10-10 LAB — URINE MICROSCOPIC-ADD ON

## 2014-10-10 LAB — URINALYSIS, ROUTINE W REFLEX MICROSCOPIC
Bilirubin Urine: NEGATIVE
Glucose, UA: NEGATIVE mg/dL
Ketones, ur: NEGATIVE mg/dL
Leukocytes, UA: NEGATIVE
Nitrite: NEGATIVE
Protein, ur: NEGATIVE mg/dL
Specific Gravity, Urine: >1.03 — ABNORMAL HIGH (ref 1.005–1.030)
Urobilinogen, UA: 0.2 mg/dL (ref 0.0–1.0)
pH: 5.5 (ref 5.0–8.0)

## 2014-10-10 LAB — LIPASE, BLOOD: Lipase: 107 U/L — ABNORMAL HIGH (ref 22–51)

## 2014-10-10 MED ORDER — OXYCODONE HCL 10 MG PO TABS: 10.0000 mg | ORAL_TABLET | Freq: Four times a day (QID) | ORAL | Status: DC | PRN

## 2014-10-10 MED ORDER — ONDANSETRON HCL 4 MG/2ML IJ SOLN
4.0000 mg | Freq: Once | INTRAMUSCULAR | Status: DC
  Filled 2014-10-10: qty 2

## 2014-10-10 MED ORDER — HYDROMORPHONE HCL 1 MG/ML IJ SOLN
1.0000 mg | Freq: Once | INTRAMUSCULAR | Status: AC
  Administered 2014-10-10: 1 mg via INTRAVENOUS
  Filled 2014-10-10: qty 1

## 2014-10-10 MED ORDER — SODIUM CHLORIDE 0.9 % IV BOLUS (SEPSIS)
1000.0000 mL | Freq: Once | INTRAVENOUS | Status: AC
  Administered 2014-10-10: 1000 mL via INTRAVENOUS

## 2014-10-10 MED ORDER — LORAZEPAM 2 MG/ML IJ SOLN
1.0000 mg | Freq: Once | INTRAMUSCULAR | Status: AC
  Administered 2014-10-10: 1 mg via INTRAVENOUS
  Filled 2014-10-10: qty 1

## 2014-10-10 MED ORDER — ONDANSETRON HCL 4 MG/2ML IJ SOLN
4.0000 mg | Freq: Once | INTRAMUSCULAR | Status: AC
  Administered 2014-10-10: 4 mg via INTRAVENOUS

## 2014-10-10 MED ORDER — KETOROLAC TROMETHAMINE 30 MG/ML IJ SOLN
15.0000 mg | Freq: Once | INTRAMUSCULAR | Status: AC
  Administered 2014-10-10: 15 mg via INTRAVENOUS
  Filled 2014-10-10: qty 1

## 2014-10-10 MED ORDER — ONDANSETRON HCL 8 MG PO TABS: 4.0000 mg | ORAL_TABLET | Freq: Three times a day (TID) | ORAL | Status: DC | PRN

## 2014-10-10 NOTE — ED Notes (Signed)
MD Kohut at bedside. 
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
## 2014-10-10 NOTE — ED Notes (Signed)
Pt c/o of increasing upper abd pain "shooting to lower back." MD Kohut made aware.

## 2014-10-10 NOTE — ED Notes (Signed)
Pt c/o upper abd pain with severe nausea and 1 episode of vomiting since midnight last night.

## 2014-10-10 NOTE — Discharge Instructions (Signed)

## 2014-10-14 ENCOUNTER — Encounter: Payer: Self-pay | Admitting: Women's Health

## 2014-10-14 ENCOUNTER — Ambulatory Visit (INDEPENDENT_AMBULATORY_CARE_PROVIDER_SITE_OTHER): Payer: 59 | Admitting: Women's Health

## 2014-10-14 VITALS — BP 100/56 | HR 84 | Wt 130.0 lb

## 2014-10-14 DIAGNOSIS — Z3009 Encounter for other general counseling and advice on contraception: Secondary | ICD-10-CM

## 2014-10-14 DIAGNOSIS — O99345 Other mental disorders complicating the puerperium: Principal | ICD-10-CM

## 2014-10-14 DIAGNOSIS — F418 Other specified anxiety disorders: Secondary | ICD-10-CM | POA: Insufficient documentation

## 2014-10-14 DIAGNOSIS — F53 Postpartum depression: Secondary | ICD-10-CM

## 2014-10-14 MED ORDER — MEDROXYPROGESTERONE ACETATE 150 MG/ML IM SUSP: 150.0000 mg | INTRAMUSCULAR | Status: DC

## 2014-10-14 MED ORDER — SERTRALINE HCL 25 MG PO TABS: 25.0000 mg | ORAL_TABLET | Freq: Every day | ORAL | Status: DC

## 2014-10-14 NOTE — Progress Notes (Signed)
Patient ID: Amber BirkKatie C Quito, female   DOB: Sep 11, 1985, 29 y.o.   MRN: [id number] Subjective:    [name] is a 29 y.o. [id number] Caucasian female who presents for a postpartum visit. She is 9 weeks postpartum following a spontaneous vaginal delivery at 32.0 gestational weeks after ptl/rom, had 500ml PPH and <10% placental abruption noted at delivery. Anesthesia: epidural. I have fully reviewed the prenatal and intrapartum course. Postpartum course has been complicated by depression. Husband of 7 years left. Baby's course has been complicated by 1 month NICU stay d/t prematurity. Baby is feeding by breast x 1 month, now bottle. Bleeding no bleeding. Bowel function is normal. Bladder function is normal. Patient is not sexually active. Last sexual activity: prior to birth of baby. Contraception method is wants depo- understands it can potentially worsen depression. Not currently sexually active- wants rx sent in and will come get injection when she is ready. Postpartum depression screening: positive. Score 20.  H/O depression/anxiety- used to take lexapro, was switched to celexa and xanax- which were both stopped by her PCP, she is unsure of why. Husband of 3867yrs left her. She does have good family support. Not eating/sleeping, doesn't find joy in things she used to. Denies SI/HI/II. Used to see counselor, has been >2062yrs and was in [place]. Desires counseling and restarting meds. Last pap 02/12/14 and was ASCUS w/ +HRHPV.  The following portions of the patient's history were reviewed and updated as appropriate: allergies, current medications, past medical history, past surgical history and problem list.  Review of Systems Pertinent items are noted in HPI.   Filed Vitals:   10/14/14 1403  BP: 100/56  Pulse: 84  Weight: 130 lb (58.968 kg)   Patient's last menstrual period was 10/05/2014.  Objective:   General:  alert, cooperative and no distress   Breasts:  deferred, no complaints  Lungs: clear to  auscultation bilaterally  Heart:  regular rate and rhythm  Abdomen: soft, nontender   Vulva: normal  Vagina: normal vagina  Cervix:  closed  Corpus: Well-involuted  Adnexa:  Non-palpable  Rectal Exam: No hemorrhoids        Assessment:   Postpartum exam 9 wks s/p preterm SVB @ 32wks d/t ptl/rom Bottlefeeding Depression screening Depression/anxiety Contraception counseling   Plan:  Rx zoloft 25mg  daily w/ 3RF, knows not to expect immediate results, can take 3-4wks [facility] Epic, to call if she doesn't hear from them w/in 1wk Contraception: rx depo, to come get when she is ready- is not currently sexually active Follow up in: 4 weeks for depression/medication f/u or earlier if needed Will need pap in Oct Discussed 17P/Makena in future pregnancies  Marge DuncansBooker, Kimberly Randall CNM, [facility] 10/14/2014 2:51 PM

## 2014-10-14 NOTE — Patient Instructions (Signed)
Postpartum Depression and Baby Blues The postpartum period begins right after the birth of a baby. During this time, there is often a great amount of joy and excitement. It is also a time of many changes in the life of the parents. Regardless of how many times a mother gives birth, each child brings new challenges and dynamics to the family. It is not unusual to have feelings of excitement along with confusing shifts in moods, emotions, and thoughts. All mothers are at risk of developing postpartum depression or the "baby blues." These mood changes can occur right after giving birth, or they may occur many months after giving birth. The baby blues or postpartum depression can be mild or severe. Additionally, postpartum depression can go away rather quickly, or it can be a long-term condition.  CAUSES Raised hormone levels and the rapid drop in those levels are thought to be a main cause of postpartum depression and the baby blues. A number of hormones change during and after pregnancy. Estrogen and progesterone usually decrease right after the delivery of your baby. The levels of thyroid hormone and various cortisol steroids also rapidly drop. Other factors that play a role in these mood changes include major life events and genetics.  RISK FACTORS If you have any of the following risks for the baby blues or postpartum depression, know what symptoms to watch out for during the postpartum period. Risk factors that may increase the likelihood of getting the baby blues or postpartum depression include:  Having a personal or family history of depression.   Having depression while being pregnant.   Having premenstrual mood issues or mood issues related to oral contraceptives.  Having a lot of life stress.   Having marital conflict.   Lacking a social support network.   Having a baby with special needs.   Having health problems, such as diabetes.  SIGNS AND SYMPTOMS Symptoms of baby blues  include:  Brief changes in mood, such as going from extreme happiness to sadness.  Decreased concentration.   Difficulty sleeping.   Crying spells, tearfulness.   Irritability.   Anxiety.  Symptoms of postpartum depression typically begin within the first month after giving birth. These symptoms include:  Difficulty sleeping or excessive sleepiness.   Marked weight loss.   Agitation.   Feelings of worthlessness.   Lack of interest in activity or food.  Postpartum psychosis is a very serious condition and can be dangerous. Fortunately, it is rare. Displaying any of the following symptoms is cause for immediate medical attention. Symptoms of postpartum psychosis include:   Hallucinations and delusions.   Bizarre or disorganized behavior.   Confusion or disorientation.  DIAGNOSIS  A diagnosis is made by an evaluation of your symptoms. There are no medical or lab tests that lead to a diagnosis, but there are various questionnaires that a health care provider may use to identify those with the baby blues, postpartum depression, or psychosis. Often, a screening tool called the Edinburgh Postnatal Depression Scale is used to diagnose depression in the postpartum period.  TREATMENT The baby blues usually goes away on its own in 1-2 weeks. Social support is often all that is needed. You will be encouraged to get adequate sleep and rest. Occasionally, you may be given medicines to help you sleep.  Postpartum depression requires treatment because it can last several months or longer if it is not treated. Treatment may include individual or group therapy, medicine, or both to address any social, physiological, and psychological   factors that may play a role in the depression. Regular exercise, a healthy diet, rest, and social support may also be strongly recommended.  Postpartum psychosis is more serious and needs treatment right away. Hospitalization is often needed. HOME CARE  INSTRUCTIONS  Get as much rest as you can. Nap when the baby sleeps.   Exercise regularly. Some women find yoga and walking to be beneficial.   Eat a balanced and nourishing diet.   Do little things that you enjoy. Have a cup of tea, take a bubble bath, read your favorite magazine, or listen to your favorite music.  Avoid alcohol.   Ask for help with household chores, cooking, grocery shopping, or running errands as needed. Do not try to do everything.   Talk to people close to you about how you are feeling. Get support from your partner, family members, friends, or other new moms.  Try to stay positive in how you think. Think about the things you are grateful for.   Do not spend a lot of time alone.   Only take over-the-counter or prescription medicine as directed by your health care provider.  Keep all your postpartum appointments.   Let your health care provider know if you have any concerns.  SEEK MEDICAL CARE IF: You are having a reaction to or problems with your medicine. SEEK IMMEDIATE MEDICAL CARE IF:  You have suicidal feelings.   You think you may harm the baby or someone else. MAKE SURE YOU:  Understand these instructions.  Will watch your condition.  Will get help right away if you are not doing well or get worse. Document Released: 01/08/2004 Document Revised: 04/10/2013 Document Reviewed: 01/15/2013 [facility] Patient Information 2015 [place], [place]. This information is not intended to replace advice given to you by your health care provider. Make sure you discuss any questions you have with your health care provider.  Generalized Anxiety Disorder Generalized anxiety disorder (GAD) is a mental disorder. It interferes with life functions, including relationships, work, and school. GAD is different from normal anxiety, which everyone experiences at some point in their lives in response to specific life events and activities. Normal anxiety actually  helps [place] prepare for and get through these life events and activities. Normal anxiety goes away after the event or activity is over.  GAD causes anxiety that is not necessarily related to specific events or activities. It also causes excess anxiety in proportion to specific events or activities. The anxiety associated with GAD is also difficult to control. GAD can vary from mild to severe. People with severe GAD can have intense waves of anxiety with physical symptoms (panic attacks).  SYMPTOMS The anxiety and worry associated with GAD are difficult to control. This anxiety and worry are related to many life events and activities and also occur more days than not for 6 months or longer. People with GAD also have three or more of the following symptoms (one or more in children):  Restlessness.   Fatigue.  Difficulty concentrating.   Irritability.  Muscle tension.  Difficulty sleeping or unsatisfying sleep. DIAGNOSIS GAD is diagnosed through an assessment by your health care provider. Your health care provider will ask you questions aboutyour mood,physical symptoms, and events in your life. Your health care provider may ask you about your medical history and use of alcohol or drugs, including prescription medicines. Your health care provider may also do a physical exam and blood tests. Certain medical conditions and the use of certain substances can cause symptoms  similar to those associated with GAD. Your health care provider may refer you to a mental health specialist for further evaluation. TREATMENT The following therapies are usually used to treat GAD:   Medication. Antidepressant medication usually is prescribed for long-term daily control. Antianxiety medicines may be added in severe cases, especially when panic attacks occur.   Talk therapy (psychotherapy). Certain types of talk therapy can be helpful in treating GAD by providing support, education, and guidance. A form of talk  therapy called cognitive behavioral therapy can teach you healthy ways to think about and react to daily life events and activities.  Stress managementtechniques. These include yoga, meditation, and exercise and can be very helpful when they are practiced regularly. A mental health specialist can help determine which treatment is best for you. Some people see improvement with one therapy. However, other people require a combination of therapies. Document Released: 07/31/2012 Document Revised: 08/20/2013 Document Reviewed: 07/31/2012 [facility] Patient Information 2015 [place]. This information is not intended to replace advice given to you by your health care provider. Make sure you discuss any questions you have with your health care provider.

## 2014-10-22 ENCOUNTER — Encounter (HOSPITAL_COMMUNITY)
Admission: RE | Admit: 2014-10-22 | Discharge: 2014-10-22 | Disposition: A | Discharge status: Home / Self Care | Payer: 59 | Source: Ambulatory Visit | Attending: Obstetrics and Gynecology | Admitting: Obstetrics and Gynecology

## 2014-10-22 DIAGNOSIS — O923 Agalactia: Secondary | ICD-10-CM | POA: Diagnosis not present

## 2014-11-12 ENCOUNTER — Encounter: Payer: 59 | Admitting: Women's Health

## 2014-11-22 ENCOUNTER — Encounter (HOSPITAL_COMMUNITY)
Admission: RE | Admit: 2014-11-22 | Discharge: 2014-11-22 | Disposition: A | Discharge status: Home / Self Care | Payer: 59 | Source: Ambulatory Visit | Attending: Obstetrics and Gynecology | Admitting: Obstetrics and Gynecology

## 2014-11-22 DIAGNOSIS — O923 Agalactia: Secondary | ICD-10-CM | POA: Insufficient documentation

## 2014-12-02 ENCOUNTER — Emergency Department (HOSPITAL_COMMUNITY)
Admission: EM | Admit: 2014-12-02 | Discharge: 2014-12-02 | Disposition: A | Discharge status: Home / Self Care | Payer: 59 | Attending: Emergency Medicine | Admitting: Emergency Medicine

## 2014-12-02 ENCOUNTER — Encounter (HOSPITAL_COMMUNITY): Payer: Self-pay | Admitting: Emergency Medicine

## 2014-12-02 DIAGNOSIS — F419 Anxiety disorder, unspecified: Secondary | ICD-10-CM | POA: Insufficient documentation

## 2014-12-02 DIAGNOSIS — I10 Essential (primary) hypertension: Secondary | ICD-10-CM | POA: Diagnosis not present

## 2014-12-02 DIAGNOSIS — K861 Other chronic pancreatitis: Secondary | ICD-10-CM | POA: Diagnosis not present

## 2014-12-02 DIAGNOSIS — Z79899 Other long term (current) drug therapy: Secondary | ICD-10-CM | POA: Insufficient documentation

## 2014-12-02 DIAGNOSIS — K219 Gastro-esophageal reflux disease without esophagitis: Secondary | ICD-10-CM | POA: Diagnosis not present

## 2014-12-02 DIAGNOSIS — F329 Major depressive disorder, single episode, unspecified: Secondary | ICD-10-CM | POA: Insufficient documentation

## 2014-12-02 DIAGNOSIS — Z720 Tobacco use: Secondary | ICD-10-CM | POA: Diagnosis not present

## 2014-12-02 DIAGNOSIS — G8929 Other chronic pain: Secondary | ICD-10-CM | POA: Diagnosis not present

## 2014-12-02 DIAGNOSIS — Z8739 Personal history of other diseases of the musculoskeletal system and connective tissue: Secondary | ICD-10-CM | POA: Insufficient documentation

## 2014-12-02 DIAGNOSIS — R109 Unspecified abdominal pain: Secondary | ICD-10-CM | POA: Diagnosis present

## 2014-12-02 DIAGNOSIS — Z3202 Encounter for pregnancy test, result negative: Secondary | ICD-10-CM | POA: Insufficient documentation

## 2014-12-02 LAB — URINALYSIS, ROUTINE W REFLEX MICROSCOPIC
BILIRUBIN URINE: NEGATIVE
Glucose, UA: NEGATIVE mg/dL
Hgb urine dipstick: NEGATIVE
KETONES UR: NEGATIVE mg/dL
Leukocytes, UA: NEGATIVE
Nitrite: NEGATIVE
Protein, ur: NEGATIVE mg/dL
SPECIFIC GRAVITY, URINE: 1.02 (ref 1.005–1.030)
UROBILINOGEN UA: 0.2 mg/dL (ref 0.0–1.0)
pH: 7 (ref 5.0–8.0)

## 2014-12-02 LAB — BASIC METABOLIC PANEL
Anion gap: 7 (ref 5–15)
BUN: 7 mg/dL (ref 6–20)
CO2: 28 mmol/L (ref 22–32)
Calcium: 9.2 mg/dL (ref 8.9–10.3)
Chloride: 103 mmol/L (ref 101–111)
Creatinine, Ser: 0.7 mg/dL (ref 0.44–1.00)
GLUCOSE: 109 mg/dL — AB (ref 65–99)
POTASSIUM: 4.3 mmol/L (ref 3.5–5.1)
Sodium: 138 mmol/L (ref 135–145)

## 2014-12-02 LAB — CBC WITH DIFFERENTIAL/PLATELET
BASOS ABS: 0 10*3/uL (ref 0.0–0.1)
Basophils Relative: 0 % (ref 0–1)
Eosinophils Absolute: 0.3 10*3/uL (ref 0.0–0.7)
Eosinophils Relative: 3 % (ref 0–5)
HCT: 39.9 % (ref 36.0–46.0)
Hemoglobin: 13 g/dL (ref 12.0–15.0)
LYMPHS PCT: 21 % (ref 12–46)
Lymphs Abs: 2.2 10*3/uL (ref 0.7–4.0)
MCH: 28.5 pg (ref 26.0–34.0)
MCHC: 32.6 g/dL (ref 30.0–36.0)
MCV: 87.5 fL (ref 78.0–100.0)
Monocytes Absolute: 0.8 10*3/uL (ref 0.1–1.0)
Monocytes Relative: 7 % (ref 3–12)
NEUTROS ABS: 7.2 10*3/uL (ref 1.7–7.7)
Neutrophils Relative %: 69 % (ref 43–77)
PLATELETS: 341 10*3/uL (ref 150–400)
RBC: 4.56 MIL/uL (ref 3.87–5.11)
RDW: 16.5 % — ABNORMAL HIGH (ref 11.5–15.5)
WBC: 10.5 10*3/uL (ref 4.0–10.5)

## 2014-12-02 LAB — PREGNANCY, URINE: PREG TEST UR: NEGATIVE

## 2014-12-02 LAB — LIPASE, BLOOD: LIPASE: 62 U/L — AB (ref 22–51)

## 2014-12-02 MED ORDER — FENTANYL CITRATE (PF) 100 MCG/2ML IJ SOLN
100.0000 ug | Freq: Once | INTRAMUSCULAR | Status: AC
  Administered 2014-12-02: 100 ug via INTRAVENOUS
  Filled 2014-12-02: qty 2

## 2014-12-02 MED ORDER — KETAMINE HCL 10 MG/ML IJ SOLN
0.3000 mg/kg | Freq: Once | INTRAMUSCULAR | Status: AC
  Administered 2014-12-02: 17 mg via INTRAVENOUS
  Filled 2014-12-02: qty 1

## 2014-12-02 MED ORDER — ONDANSETRON HCL 4 MG/2ML IJ SOLN
4.0000 mg | Freq: Once | INTRAMUSCULAR | Status: AC
  Administered 2014-12-02: 4 mg via INTRAVENOUS
  Filled 2014-12-02: qty 2

## 2014-12-02 MED ORDER — ONDANSETRON 8 MG PO TBDP: 8.0000 mg | ORAL_TABLET | Freq: Three times a day (TID) | ORAL | Status: DC | PRN

## 2014-12-02 MED ORDER — SODIUM CHLORIDE 0.9 % IV BOLUS (SEPSIS)
1000.0000 mL | Freq: Once | INTRAVENOUS | Status: AC
  Administered 2014-12-02: 1000 mL via INTRAVENOUS

## 2014-12-02 MED ORDER — PROMETHAZINE HCL 25 MG/ML IJ SOLN
25.0000 mg | INTRAMUSCULAR | Status: DC | PRN
  Administered 2014-12-02: 25 mg via INTRAVENOUS
  Filled 2014-12-02: qty 1

## 2014-12-02 MED ORDER — OXYCODONE HCL 10 MG PO TABS: 10.0000 mg | ORAL_TABLET | Freq: Four times a day (QID) | ORAL | Status: DC | PRN

## 2014-12-02 NOTE — ED Notes (Signed)
Notified dr. Radford Pax that pt is in pain and requesting pain and nausea medication.

## 2014-12-02 NOTE — ED Notes (Signed)
Patient complaining of abdominal pain and vomiting starting this morning. Patient has history of pancreatitis.

## 2014-12-02 NOTE — ED Notes (Addendum)
Spoke with dr. Radford Pax concerning ketamine administration. Dr. Radford Pax notified nurse that pt would need to be in a room for cardiac monitoring before administration by dr. Radford Pax.  Pt notified.
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
## 2014-12-02 NOTE — Discharge Instructions (Signed)
Low-Fat Diet for Pancreatitis or Gallbladder Conditions °A low-fat diet can be helpful if you have pancreatitis or a gallbladder condition. With these conditions, your pancreas and gallbladder have trouble digesting fats. A healthy eating plan with less fat will help rest your pancreas and gallbladder and reduce your symptoms. °WHAT DO I NEED TO KNOW ABOUT THIS DIET? °· Eat a low-fat diet. °¨ Reduce your fat intake to less than 20-30% of your total daily calories. This is less than 50-60 g of fat per day. °¨ Remember that you need some fat in your diet. Ask your dietician what your daily goal should be. °¨ Choose nonfat and low-fat healthy foods. Look for the words "nonfat," "low fat," or "fat free." °¨ As a guide, look on the label and choose foods with less than 3 g of fat per serving. Eat only one serving. °· Avoid alcohol. °· Do not smoke. If you need help quitting, talk with your health care provider. °· Eat small frequent meals instead of three large heavy meals. °WHAT FOODS CAN I EAT? °Grains °Include healthy grains and starches such as potatoes, wheat bread, fiber-rich cereal, and brown rice. Choose whole grain options whenever possible. In adults, whole grains should account for 45-65% of your daily calories.  °Fruits and Vegetables °Eat plenty of fruits and vegetables. Fresh fruits and vegetables add fiber to your diet. °Meats and Other Protein Sources °Eat lean meat such as chicken and pork. Trim any fat off of meat before cooking it. Eggs, fish, and beans are other sources of protein. In adults, these foods should account for 10-35% of your daily calories. °Dairy °Choose low-fat milk and dairy options. Dairy includes fat and protein, as well as calcium.  °Fats and Oils °Limit high-fat foods such as fried foods, sweets, baked goods, sugary drinks.  °Other °Creamy sauces and condiments, such as mayonnaise, can add extra fat. Think about whether or not you need to use them, or use smaller amounts or low fat  options. °WHAT FOODS ARE NOT RECOMMENDED? °· High fat foods, such as: °¨ Baked goods. °¨ Ice cream. °¨ French toast. °¨ Sweet rolls. °¨ Pizza. °¨ Cheese bread. °¨ Foods covered with batter, butter, creamy sauces, or cheese. °¨ Fried foods. °¨ Sugary drinks and desserts. °· Foods that cause gas or bloating °Document Released: 04/10/2013 Document Reviewed: 04/10/2013 °Exit[employer]® Patient Information ©2015 [employer]. This information is not intended to replace advice given to you by your health care provider. Make sure you discuss any questions you have with your health care provider. ° °

## 2014-12-22 ENCOUNTER — Encounter (HOSPITAL_COMMUNITY)
Admission: RE | Admit: 2014-12-22 | Discharge: 2014-12-22 | Disposition: A | Discharge status: Home / Self Care | Payer: 59 | Source: Ambulatory Visit | Attending: Obstetrics and Gynecology | Admitting: Obstetrics and Gynecology

## 2015-01-15 ENCOUNTER — Encounter (HOSPITAL_COMMUNITY): Payer: Self-pay | Admitting: *Deleted

## 2015-01-15 NOTE — Progress Notes (Signed)
Called Dr. Handy's office to request pre-op orders, spoke with Gwen. 

## 2015-01-16 ENCOUNTER — Ambulatory Visit (HOSPITAL_COMMUNITY)
Admission: RE | Admit: 2015-01-16 | Discharge: 2015-01-16 | Disposition: A | Discharge status: Home / Self Care | Payer: 59 | Source: Ambulatory Visit | Attending: Orthopedic Surgery | Admitting: Orthopedic Surgery

## 2015-01-16 ENCOUNTER — Encounter (HOSPITAL_COMMUNITY): Payer: Self-pay | Admitting: General Practice

## 2015-01-16 ENCOUNTER — Ambulatory Visit (HOSPITAL_COMMUNITY): Payer: 59 | Admitting: Anesthesiology

## 2015-01-16 ENCOUNTER — Encounter (HOSPITAL_COMMUNITY)
Admission: RE | Disposition: A | Discharge status: Home / Self Care | Payer: Self-pay | Source: Ambulatory Visit | Attending: Orthopedic Surgery

## 2015-01-16 ENCOUNTER — Ambulatory Visit (HOSPITAL_COMMUNITY): Payer: 59

## 2015-01-16 DIAGNOSIS — F17210 Nicotine dependence, cigarettes, uncomplicated: Secondary | ICD-10-CM | POA: Insufficient documentation

## 2015-01-16 DIAGNOSIS — K219 Gastro-esophageal reflux disease without esophagitis: Secondary | ICD-10-CM | POA: Insufficient documentation

## 2015-01-16 DIAGNOSIS — T849XXA Unspecified complication of internal orthopedic prosthetic device, implant and graft, initial encounter: Secondary | ICD-10-CM | POA: Diagnosis not present

## 2015-01-16 DIAGNOSIS — Z79899 Other long term (current) drug therapy: Secondary | ICD-10-CM | POA: Insufficient documentation

## 2015-01-16 DIAGNOSIS — Z419 Encounter for procedure for purposes other than remedying health state, unspecified: Secondary | ICD-10-CM

## 2015-01-16 DIAGNOSIS — Y831 Surgical operation with implant of artificial internal device as the cause of abnormal reaction of the patient, or of later complication, without mention of misadventure at the time of the procedure: Secondary | ICD-10-CM | POA: Insufficient documentation

## 2015-01-16 HISTORY — PX: HARDWARE REMOVAL: SHX979

## 2015-01-16 HISTORY — DX: Anemia, unspecified: D64.9

## 2015-01-16 HISTORY — DX: Headache, unspecified: R51.9

## 2015-01-16 HISTORY — DX: Pneumonia, unspecified organism: J18.9

## 2015-01-16 HISTORY — DX: Headache: R51

## 2015-01-16 LAB — CBC
HCT: 38.1 % (ref 36.0–46.0)
Hemoglobin: 12.3 g/dL (ref 12.0–15.0)
MCH: 28.9 pg (ref 26.0–34.0)
MCHC: 32.3 g/dL (ref 30.0–36.0)
MCV: 89.6 fL (ref 78.0–100.0)
PLATELETS: 323 10*3/uL (ref 150–400)
RBC: 4.25 MIL/uL (ref 3.87–5.11)
RDW: 15.7 % — AB (ref 11.5–15.5)
WBC: 9.3 10*3/uL (ref 4.0–10.5)

## 2015-01-16 LAB — HCG, SERUM, QUALITATIVE: PREG SERUM: NEGATIVE

## 2015-01-16 SURGERY — REMOVAL, HARDWARE
Anesthesia: General | Site: Leg Lower | Laterality: Left

## 2015-01-16 MED ORDER — HYDROMORPHONE HCL 1 MG/ML IJ SOLN
INTRAMUSCULAR | Status: AC
  Administered 2015-01-16: 0.5 mg via INTRAVENOUS
  Filled 2015-01-16: qty 1

## 2015-01-16 MED ORDER — CHLORHEXIDINE GLUCONATE 4 % EX LIQD: 60.0000 mL | Freq: Once | CUTANEOUS | Status: DC

## 2015-01-16 MED ORDER — FENTANYL CITRATE (PF) 250 MCG/5ML IJ SOLN
INTRAMUSCULAR | Status: AC
  Filled 2015-01-16: qty 5

## 2015-01-16 MED ORDER — KETOROLAC TROMETHAMINE 10 MG PO TABS: 10.0000 mg | ORAL_TABLET | Freq: Four times a day (QID) | ORAL | Status: DC | PRN

## 2015-01-16 MED ORDER — PROMETHAZINE HCL 25 MG/ML IJ SOLN: 6.2500 mg | INTRAMUSCULAR | Status: DC | PRN

## 2015-01-16 MED ORDER — LIDOCAINE HCL 1 % IJ SOLN
INTRAMUSCULAR | Status: DC | PRN
  Administered 2015-01-16: 100 mg via INTRADERMAL

## 2015-01-16 MED ORDER — BUPIVACAINE-EPINEPHRINE (PF) 0.5% -1:200000 IJ SOLN
INTRAMUSCULAR | Status: AC
  Filled 2015-01-16: qty 30

## 2015-01-16 MED ORDER — MIDAZOLAM HCL 2 MG/2ML IJ SOLN
INTRAMUSCULAR | Status: AC
  Filled 2015-01-16: qty 4

## 2015-01-16 MED ORDER — ONDANSETRON HCL 4 MG/2ML IJ SOLN
INTRAMUSCULAR | Status: DC | PRN
  Administered 2015-01-16: 4 mg via INTRAVENOUS

## 2015-01-16 MED ORDER — PROPOFOL 10 MG/ML IV BOLUS
INTRAVENOUS | Status: DC | PRN
  Administered 2015-01-16: 200 mg via INTRAVENOUS

## 2015-01-16 MED ORDER — HYDROMORPHONE HCL 1 MG/ML IJ SOLN
0.2500 mg | INTRAMUSCULAR | Status: DC | PRN
  Administered 2015-01-16 (×3): 0.5 mg via INTRAVENOUS

## 2015-01-16 MED ORDER — BUPIVACAINE LIPOSOME 1.3 % IJ SUSP
20.0000 mL | INTRAMUSCULAR | Status: AC
  Administered 2015-01-16: 20 mL
  Filled 2015-01-16: qty 20

## 2015-01-16 MED ORDER — PROPOFOL 10 MG/ML IV BOLUS
INTRAVENOUS | Status: AC
  Filled 2015-01-16: qty 20

## 2015-01-16 MED ORDER — HYDROMORPHONE HCL 1 MG/ML IJ SOLN
0.2500 mg | INTRAMUSCULAR | Status: DC | PRN
  Administered 2015-01-16 (×4): 0.5 mg via INTRAVENOUS

## 2015-01-16 MED ORDER — SODIUM CHLORIDE 0.9 % IJ SOLN
INTRAMUSCULAR | Status: DC | PRN
  Administered 2015-01-16: 20 mL

## 2015-01-16 MED ORDER — CEFAZOLIN SODIUM-DEXTROSE 2-3 GM-% IV SOLR
2.0000 g | INTRAVENOUS | Status: AC
  Administered 2015-01-16: 2 g via INTRAVENOUS
  Filled 2015-01-16: qty 50

## 2015-01-16 MED ORDER — HYDROMORPHONE HCL 1 MG/ML IJ SOLN
INTRAMUSCULAR | Status: AC
  Administered 2015-01-16: 0.5 mg via INTRAVENOUS
  Filled 2015-01-16: qty 2

## 2015-01-16 MED ORDER — LACTATED RINGERS IV SOLN
INTRAVENOUS | Status: DC
Start: 2015-01-16 — End: 2015-01-16
  Administered 2015-01-16 (×2): via INTRAVENOUS

## 2015-01-16 MED ORDER — PROMETHAZINE HCL 25 MG/ML IJ SOLN
25.0000 mg | INTRAMUSCULAR | Status: AC
  Administered 2015-01-16: 25 mg via INTRAVENOUS
  Filled 2015-01-16: qty 1

## 2015-01-16 MED ORDER — 0.9 % SODIUM CHLORIDE (POUR BTL) OPTIME
TOPICAL | Status: DC | PRN
  Administered 2015-01-16: 1000 mL

## 2015-01-16 MED ORDER — FENTANYL CITRATE (PF) 250 MCG/5ML IJ SOLN
INTRAMUSCULAR | Status: DC | PRN
  Administered 2015-01-16: 100 ug via INTRAVENOUS
  Administered 2015-01-16: 150 ug via INTRAVENOUS

## 2015-01-16 MED ORDER — MIDAZOLAM HCL 5 MG/5ML IJ SOLN
INTRAMUSCULAR | Status: DC | PRN
  Administered 2015-01-16 (×2): 2 mg via INTRAVENOUS

## 2015-01-16 MED ORDER — HYDROCODONE-ACETAMINOPHEN 5-325 MG PO TABS: 1.0000 | ORAL_TABLET | Freq: Three times a day (TID) | ORAL | Status: DC | PRN

## 2015-01-16 MED ORDER — DEXAMETHASONE SODIUM PHOSPHATE 4 MG/ML IJ SOLN
INTRAMUSCULAR | Status: DC | PRN
  Administered 2015-01-16: 4 mg via INTRAVENOUS

## 2015-01-16 MED ORDER — ACETAMINOPHEN 500 MG PO TABS
1000.0000 mg | ORAL_TABLET | Freq: Once | ORAL | Status: AC
  Administered 2015-01-16: 1000 mg via ORAL
  Filled 2015-01-16: qty 2

## 2015-01-16 SURGICAL SUPPLY — 64 items
BANDAGE ELASTIC 4 VELCRO ST LF (GAUZE/BANDAGES/DRESSINGS) ×1 IMPLANT
BANDAGE ELASTIC 6 VELCRO ST LF (GAUZE/BANDAGES/DRESSINGS) ×3 IMPLANT
BANDAGE ESMARK 6X9 LF (GAUZE/BANDAGES/DRESSINGS) ×1 IMPLANT
BNDG CMPR 9X6 STRL LF SNTH (GAUZE/BANDAGES/DRESSINGS) ×1
BNDG COHESIVE 6X5 TAN STRL LF (GAUZE/BANDAGES/DRESSINGS) ×1 IMPLANT
BNDG ESMARK 6X9 LF (GAUZE/BANDAGES/DRESSINGS) ×3
BNDG GAUZE ELAST 4 BULKY (GAUZE/BANDAGES/DRESSINGS) ×4 IMPLANT
BRUSH SCRUB DISP (MISCELLANEOUS) ×4 IMPLANT
CLEANER TIP ELECTROSURG 2X2 (MISCELLANEOUS) ×3 IMPLANT
CLOSURE WOUND 1/2 X4 (GAUZE/BANDAGES/DRESSINGS)
COVER SURGICAL LIGHT HANDLE (MISCELLANEOUS) ×4 IMPLANT
CUFF TOURNIQUET SINGLE 18IN (TOURNIQUET CUFF) IMPLANT
CUFF TOURNIQUET SINGLE 24IN (TOURNIQUET CUFF) IMPLANT
CUFF TOURNIQUET SINGLE 34IN LL (TOURNIQUET CUFF) IMPLANT
DRAPE C-ARM 42X72 X-RAY (DRAPES) ×2 IMPLANT
DRAPE C-ARMOR (DRAPES) ×3 IMPLANT
DRAPE OEC MINIVIEW 54X84 (DRAPES) ×1 IMPLANT
DRAPE U-SHAPE 47X51 STRL (DRAPES) ×3 IMPLANT
DRSG ADAPTIC 3X8 NADH LF (GAUZE/BANDAGES/DRESSINGS) ×3 IMPLANT
DRSG PAD ABDOMINAL 8X10 ST (GAUZE/BANDAGES/DRESSINGS) ×2 IMPLANT
ELECT REM PT RETURN 9FT ADLT (ELECTROSURGICAL) ×3
ELECTRODE REM PT RTRN 9FT ADLT (ELECTROSURGICAL) ×1 IMPLANT
EVACUATOR 1/8 PVC DRAIN (DRAIN) IMPLANT
GAUZE SPONGE 4X4 12PLY STRL (GAUZE/BANDAGES/DRESSINGS) ×3 IMPLANT
GLOVE BIO SURGEON STRL SZ7.5 (GLOVE) ×3 IMPLANT
GLOVE BIO SURGEON STRL SZ8 (GLOVE) ×3 IMPLANT
GLOVE BIOGEL PI IND STRL 7.5 (GLOVE) ×1 IMPLANT
GLOVE BIOGEL PI IND STRL 8 (GLOVE) ×1 IMPLANT
GLOVE BIOGEL PI INDICATOR 7.5 (GLOVE) ×2
GLOVE BIOGEL PI INDICATOR 8 (GLOVE) ×2
GOWN STRL REUS W/ TWL LRG LVL3 (GOWN DISPOSABLE) ×2 IMPLANT
GOWN STRL REUS W/ TWL XL LVL3 (GOWN DISPOSABLE) ×1 IMPLANT
GOWN STRL REUS W/TWL LRG LVL3 (GOWN DISPOSABLE) ×6
GOWN STRL REUS W/TWL XL LVL3 (GOWN DISPOSABLE) ×3
KIT BASIN OR (CUSTOM PROCEDURE TRAY) ×3 IMPLANT
KIT ROOM TURNOVER OR (KITS) ×3 IMPLANT
MANIFOLD NEPTUNE II (INSTRUMENTS) ×3 IMPLANT
NEEDLE 22X1 1/2 (OR ONLY) (NEEDLE) ×2 IMPLANT
NS IRRIG 1000ML POUR BTL (IV SOLUTION) ×3 IMPLANT
PACK ORTHO EXTREMITY (CUSTOM PROCEDURE TRAY) ×3 IMPLANT
PAD ARMBOARD 7.5X6 YLW CONV (MISCELLANEOUS) ×6 IMPLANT
PADDING CAST COTTON 6X4 STRL (CAST SUPPLIES) ×3 IMPLANT
SPONGE LAP 18X18 X RAY DECT (DISPOSABLE) ×3 IMPLANT
SPONGE SCRUB IODOPHOR (GAUZE/BANDAGES/DRESSINGS) ×3 IMPLANT
STAPLER VISISTAT 35W (STAPLE) ×2 IMPLANT
STOCKINETTE IMPERVIOUS LG (DRAPES) ×3 IMPLANT
STRIP CLOSURE SKIN 1/2X4 (GAUZE/BANDAGES/DRESSINGS) IMPLANT
SUCTION FRAZIER TIP 10 FR DISP (SUCTIONS) IMPLANT
SUT ETHILON 3 0 PS 1 (SUTURE) ×2 IMPLANT
SUT PDS AB 0 CT 36 (SUTURE) ×2 IMPLANT
SUT PDS AB 2-0 CT1 27 (SUTURE) ×2 IMPLANT
SUT VIC AB 0 CT1 27 (SUTURE)
SUT VIC AB 0 CT1 27XBRD ANBCTR (SUTURE) IMPLANT
SUT VIC AB 2-0 CT1 27 (SUTURE)
SUT VIC AB 2-0 CT1 TAPERPNT 27 (SUTURE) IMPLANT
SYR CONTROL 10ML LL (SYRINGE) ×2 IMPLANT
SYRINGE 60CC LL (MISCELLANEOUS) IMPLANT
TOWEL OR 17X24 6PK STRL BLUE (TOWEL DISPOSABLE) ×6 IMPLANT
TOWEL OR 17X26 10 PK STRL BLUE (TOWEL DISPOSABLE) ×6 IMPLANT
TUBE CONNECTING 12'X1/4 (SUCTIONS) ×1
TUBE CONNECTING 12X1/4 (SUCTIONS) ×2 IMPLANT
UNDERPAD 30X30 INCONTINENT (UNDERPADS AND DIAPERS) ×3 IMPLANT
WATER STERILE IRR 1000ML POUR (IV SOLUTION) ×2 IMPLANT
YANKAUER SUCT BULB TIP NO VENT (SUCTIONS) ×3 IMPLANT

## 2015-01-16 NOTE — Progress Notes (Signed)
Patient states rx for pain medication "does not work for her" and would like Dr. Carola Frost to write for Oxycodone IR for her instead.  Dr. Carola Frost called in OR, message relayed, and Dr. Carola Frost stated that he would speak with patient when surgery was over.  Patient declined to wait, wrote Dr. Carola Frost a note, and was discharged to home with Parent.

## 2015-01-16 NOTE — Brief Op Note (Signed)
01/16/2015  9:07 AM  PATIENT:  [name]  29 y.o. female  PRE-OPERATIVE DIAGNOSIS:  SYMPTOMATIC HARDWARE LEFT TIBIA  POST-OPERATIVE DIAGNOSIS:  SYMPTOMATIC HARDWARE LEFT TIBIA  PROCEDURE:  Procedure(s): HARDWARE REMOVAL LEFT TIBIAL (Left)  SURGEON:  Surgeon(s) and Role:    * Myrene Galas, MD - Primary  PHYSICIAN ASSISTANT: [name], PA-C  ANESTHESIA:   general  I/O:     SPECIMEN:  No Specimen  TOURNIQUET:  * No tourniquets in log *  DICTATION: .Other Dictation: Dictation Number [id number]

## 2015-01-16 NOTE — Anesthesia Postprocedure Evaluation (Signed)
Anesthesia Post Note  Patient: [name]  Procedure(s) Performed: Procedure(s) (LRB): HARDWARE REMOVAL LEFT TIBIAL (Left)  Anesthesia type: general  Patient location: PACU  Post pain: Pain level controlled  Post assessment: Patient's Cardiovascular Status Stable  Last Vitals:  Filed Vitals:   01/16/15 1000  BP: 124/80  Pulse: 64  Temp:   Resp: 12    Post vital signs: Reviewed and stable  Level of consciousness: sedated  Complications: No apparent anesthesia complications
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
## 2015-01-16 NOTE — Op Note (Signed)
NAME:  [name], [name] NO.:  [id number]  MEDICAL RECORD NO.:  [id number]  LOCATION:  [facility]                         FACILITY:  [facility]  PHYSICIAN:  Doralee Albino. Carola Frost, M.D. DATE OF BIRTH:  [birth year]  DATE OF PROCEDURE:  01/16/2015 DATE OF DISCHARGE:  01/16/2015                              OPERATIVE REPORT   PREOPERATIVE DIAGNOSIS:  Symptomatic hardware, left tibia.  POSTOPERATIVE DIAGNOSIS:  Symptomatic hardware, left tibia.  PROCEDURE:  Removal of deep implant, left tibia.  SURGEON:  Doralee Albino. Carola Frost, MD  ASSISTANT:  Mearl Latin, PA-C.  ANESTHESIA:  General.  COMPLICATIONS:  None.  TOURNIQUET:  None.  DISPOSITION:  To PACU.  CONDITION:  Stable.  BRIEF SUMMARY AND INDICATION FOR PROCEDURE:  [name] is a very pleasant 29 year old female, status post bilateral tibial plateau fractures and previous removal of the plate on the right and has had a persistent sensitivity and pain of the left-sided proximal tibia plate. These have been refractory to conservative measures and she requests removal.  I discussed with her the risks and benefits, which included failure to alleviate symptoms, infection, nerve injury, vessel injury, DVT, PE, loss of motion, need for further surgery, multiple others, including anesthetic complications.  She acknowledged these risks and did wish to proceed.  BRIEF SUMMARY OF PROCEDURE:  [name] received antibiotics preoperatively, and was taken to the OR, where general anesthesia was induced.  Her left lower extremity was prepped and draped in usual sterile fashion.  No tourniquet was used during the procedure.  The old incision was remade after a time-out.  Dissection was carried down to the proximal tibial plate, which had a synovium over it as well as a pocket of the viscous on inflammatory, but clear fluid consistent with bursitis.  The heads of the screws were exposed and was withdrawn without complication.  A rongeur  was used to remove the fibrous tissue protruding through the unused holes.  A free lag screw was also removed anteriorly and did use a small curette to remove the bone that had overgrown the lower limits, the lower edge of the screw.  It was withdrawn without difficulty.  The little area of bone there was smoothed down, wound irrigated thoroughly, closed in a standard layered fashion using PDS and 3-0 nylon.  Exparel was injected into the area, __________ pain relief.  The patient had an application of a sterile gently compressive dressing.  C-arm was brought in to confirm removal of all hardware and a smooth appearance of the joint surface.  The patient was taken to PACU in stable condition.  [name], [facility] assisted me throughout, including with removal of the hardware exposure and closure.  PROGNOSIS:  [name] will be weightbearing as tolerated, with no formal restrictions.  We will plan to see her back in 2 weeks for removal of sutures.  She will be on Norco for pain control.  She may shower in 2 days.     Doralee Albino. Carola Frost, M.D.     [facility]  D:  01/16/2015  T:  01/16/2015  Job:  [id number]

## 2015-01-16 NOTE — Discharge Instructions (Addendum)
°What to eat: ° °For your first meals, you should eat lightly; only small meals initially.  If you do not have nausea, you may eat larger meals.  Avoid spicy, greasy and heavy food.   ° °General Anesthesia, Adult, Care After  °Refer to this sheet in the next few weeks. These instructions provide you with information on caring for yourself after your procedure. Your health care provider may also give you more specific instructions. Your treatment has been planned according to current medical practices, but problems sometimes occur. Call your health care provider if you have any problems or questions after your procedure.  °WHAT TO EXPECT AFTER THE PROCEDURE  °After the procedure, it is typical to experience:  °Sleepiness.  °Nausea and vomiting. °HOME CARE INSTRUCTIONS  °For the first 24 hours after general anesthesia:  °Have a responsible person with you.  °Do not drive a car. If you are alone, do not take public transportation.  °Do not drink alcohol.  °Do not take medicine that has not been prescribed by your health care provider.  °Do not sign important papers or make important decisions.  °You may resume a normal diet and activities as directed by your health care provider.  °Change bandages (dressings) as directed.  °If you have questions or problems that seem related to general anesthesia, call the hospital and ask for the anesthetist or anesthesiologist on call. °SEEK MEDICAL CARE IF:  °You have nausea and vomiting that continue the day after anesthesia.  °You develop a rash. °SEEK IMMEDIATE MEDICAL CARE IF:  °You have difficulty breathing.  °You have chest pain.  °You have any allergic problems. °Document Released: 07/12/2000 Document Revised: 12/06/2012 Document Reviewed: 10/19/2012  °ExitCare® Patient Information ©2014 [employer].  ° °Sore Throat  ° ° °A sore throat is a painful, burning, sore, or scratchy feeling of the throat. There may be pain or tenderness when swallowing or talking. You may have  other symptoms with a sore throat. These include coughing, sneezing, fever, or a swollen neck. A sore throat is often the first sign of another sickness. These sicknesses may include a cold, flu, strep throat, or an infection called mono. Most sore throats go away without medical treatment.  °HOME CARE  °Only take medicine as told by your doctor.  °Drink enough fluids to keep your pee (urine) clear or pale yellow.  °Rest as needed.  °Try using throat sprays, lozenges, or suck on hard candy (if older than 4 years or as told).  °Sip warm liquids, such as broth, herbal tea, or warm water with honey. Try sucking on frozen ice pops or drinking cold liquids.  °Rinse the mouth (gargle) with salt water. Mix 1 teaspoon salt with 8 ounces of water.  °Do not smoke. Avoid being around others when they are smoking.  °Put a humidifier in your bedroom at night to moisten the air. You can also turn on a hot shower and sit in the bathroom for 5-10 minutes. Be sure the bathroom door is closed. °GET HELP RIGHT AWAY IF:  °You have trouble breathing.  °You cannot swallow fluids, soft foods, or your spit (saliva).  °You have more puffiness (swelling) in the throat.  °Your sore throat does not get better in 7 days.  °You feel sick to your stomach (nauseous) and throw up (vomit).  °You have a fever or lasting symptoms for more than 2-3 days.  °You have a fever and your symptoms suddenly get worse. °MAKE SURE YOU:  °Understand these   instructions.  °Will watch your condition.  °Will get help right away if you are not doing well or get worse. °Document Released: 01/13/2008 Document Revised: 12/29/2011 Document Reviewed: 12/12/2011  °ExitCare® Patient Information ©2015 [employer]. This information is not intended to replace advice given to you by your health care provider. Make sure you discuss any questions you have with your health care provider.  ° ° ° °

## 2015-01-16 NOTE — Transfer of Care (Signed)
Immediate Anesthesia Transfer of Care Note  Patient: [name]  Procedure(s) Performed: Procedure(s): HARDWARE REMOVAL LEFT TIBIAL (Left)  Patient Location: PACU  Anesthesia Type:General  Level of Consciousness: awake, alert  and oriented  Airway & Oxygen Therapy: Patient Spontanous Breathing and Patient connected to nasal cannula oxygen  Post-op Assessment: Report given to RN, Post -op Vital signs reviewed and stable and Patient moving all extremities X 4  Post vital signs: Reviewed and stable  Last Vitals:  Filed Vitals:   01/16/15 0914  BP:   Pulse:   Temp: 36.4 C  Resp:     Complications: No apparent anesthesia complications

## 2015-01-16 NOTE — Anesthesia Preprocedure Evaluation (Addendum)
Anesthesia Evaluation  Patient identified by MRN, date of birth, ID band Patient awake    Reviewed: Allergy & Precautions, NPO status , Patient's Chart, lab work & pertinent test results  History of Anesthesia Complications Negative for: history of anesthetic complications  Airway Mallampati: II  TM Distance: >3 FB Neck ROM: Full    Dental  (+) Teeth Intact, Dental Advisory Given   Pulmonary Current Smoker,    Pulmonary exam normal        Cardiovascular hypertension, Normal cardiovascular exam     Neuro/Psych  Headaches, Anxiety Depression    GI/Hepatic Neg liver ROS, GERD  ,  Endo/Other  negative endocrine ROS  Renal/GU negative Renal ROS     Musculoskeletal   Abdominal   Peds  Hematology   Anesthesia Other Findings   Reproductive/Obstetrics                            Anesthesia Physical Anesthesia Plan  ASA: II  Anesthesia Plan: General   Post-op Pain Management:    Induction: Intravenous  Airway Management Planned: Oral ETT  Additional Equipment:   Intra-op Plan:   Post-operative Plan: Extubation in OR  Informed Consent: I have reviewed the patients History and Physical, chart, labs and discussed the procedure including the risks, benefits and alternatives for the proposed anesthesia with the patient or authorized representative who has indicated his/her understanding and acceptance.   Dental advisory given  Plan Discussed with: CRNA, Anesthesiologist and Surgeon  Anesthesia Plan Comments:        Anesthesia Quick Evaluation

## 2015-01-16 NOTE — Anesthesia Procedure Notes (Signed)
Procedure Name: LMA Insertion Date/Time: 01/16/2015 8:24 AM Performed by: Yvonne Kendall S Intubation Type: IV induction LMA: LMA inserted LMA Size: 4.0 Placement Confirmation: positive ETCO2 and breath sounds checked- equal and bilateral Tube secured with: Tape Dental Injury: Teeth and Oropharynx as per pre-operative assessment

## 2015-01-17 ENCOUNTER — Encounter (HOSPITAL_COMMUNITY): Payer: Self-pay | Admitting: Orthopedic Surgery

## 2015-01-21 ENCOUNTER — Encounter (HOSPITAL_COMMUNITY)
Admission: RE | Admit: 2015-01-21 | Discharge: 2015-01-21 | Disposition: A | Discharge status: Home / Self Care | Payer: 59 | Source: Ambulatory Visit | Attending: Obstetrics and Gynecology | Admitting: Obstetrics and Gynecology

## 2015-02-09 ENCOUNTER — Emergency Department (HOSPITAL_COMMUNITY)
Admission: EM | Admit: 2015-02-09 | Discharge: 2015-02-09 | Disposition: A | Discharge status: Home / Self Care | Payer: 59 | Attending: Emergency Medicine | Admitting: Emergency Medicine

## 2015-02-09 ENCOUNTER — Encounter (HOSPITAL_COMMUNITY): Payer: Self-pay | Admitting: Emergency Medicine

## 2015-02-09 DIAGNOSIS — Z8679 Personal history of other diseases of the circulatory system: Secondary | ICD-10-CM | POA: Diagnosis not present

## 2015-02-09 DIAGNOSIS — Z3202 Encounter for pregnancy test, result negative: Secondary | ICD-10-CM | POA: Insufficient documentation

## 2015-02-09 DIAGNOSIS — Z862 Personal history of diseases of the blood and blood-forming organs and certain disorders involving the immune mechanism: Secondary | ICD-10-CM | POA: Diagnosis not present

## 2015-02-09 DIAGNOSIS — K219 Gastro-esophageal reflux disease without esophagitis: Secondary | ICD-10-CM | POA: Diagnosis not present

## 2015-02-09 DIAGNOSIS — Z8701 Personal history of pneumonia (recurrent): Secondary | ICD-10-CM | POA: Diagnosis not present

## 2015-02-09 DIAGNOSIS — Z8659 Personal history of other mental and behavioral disorders: Secondary | ICD-10-CM | POA: Insufficient documentation

## 2015-02-09 DIAGNOSIS — K861 Other chronic pancreatitis: Secondary | ICD-10-CM | POA: Insufficient documentation

## 2015-02-09 DIAGNOSIS — Z8739 Personal history of other diseases of the musculoskeletal system and connective tissue: Secondary | ICD-10-CM | POA: Diagnosis not present

## 2015-02-09 DIAGNOSIS — G8929 Other chronic pain: Secondary | ICD-10-CM | POA: Diagnosis not present

## 2015-02-09 DIAGNOSIS — Z8619 Personal history of other infectious and parasitic diseases: Secondary | ICD-10-CM | POA: Diagnosis not present

## 2015-02-09 DIAGNOSIS — Z720 Tobacco use: Secondary | ICD-10-CM | POA: Diagnosis not present

## 2015-02-09 DIAGNOSIS — R112 Nausea with vomiting, unspecified: Secondary | ICD-10-CM | POA: Diagnosis present

## 2015-02-09 DIAGNOSIS — Z79899 Other long term (current) drug therapy: Secondary | ICD-10-CM | POA: Insufficient documentation

## 2015-02-09 LAB — COMPREHENSIVE METABOLIC PANEL
ALBUMIN: 5 g/dL (ref 3.5–5.0)
ALT: 16 U/L (ref 14–54)
AST: 17 U/L (ref 15–41)
Alkaline Phosphatase: 96 U/L (ref 38–126)
Anion gap: 8 (ref 5–15)
BILIRUBIN TOTAL: 0.8 mg/dL (ref 0.3–1.2)
BUN: 19 mg/dL (ref 6–20)
CO2: 24 mmol/L (ref 22–32)
CREATININE: 0.7 mg/dL (ref 0.44–1.00)
Calcium: 10 mg/dL (ref 8.9–10.3)
Chloride: 102 mmol/L (ref 101–111)
GFR calc Af Amer: >60 mL/min (ref >60)
GLUCOSE: 111 mg/dL — AB (ref 65–99)
POTASSIUM: 4.1 mmol/L (ref 3.5–5.1)
Sodium: 134 mmol/L — ABNORMAL LOW (ref 135–145)
TOTAL PROTEIN: 8.5 g/dL — AB (ref 6.5–8.1)

## 2015-02-09 LAB — CBC WITH DIFFERENTIAL/PLATELET
BASOS ABS: 0 10*3/uL (ref 0.0–0.1)
BASOS PCT: 1 %
Eosinophils Absolute: 0.2 10*3/uL (ref 0.0–0.7)
Eosinophils Relative: 4 %
HEMATOCRIT: 41 % (ref 36.0–46.0)
HEMOGLOBIN: 13.9 g/dL (ref 12.0–15.0)
LYMPHS PCT: 39 %
Lymphs Abs: 2.4 10*3/uL (ref 0.7–4.0)
MCH: 29.5 pg (ref 26.0–34.0)
MCHC: 33.9 g/dL (ref 30.0–36.0)
MCV: 87 fL (ref 78.0–100.0)
Monocytes Absolute: 0.6 10*3/uL (ref 0.1–1.0)
Monocytes Relative: 9 %
NEUTROS ABS: 2.9 10*3/uL (ref 1.7–7.7)
NEUTROS PCT: 47 %
Platelets: 409 10*3/uL — ABNORMAL HIGH (ref 150–400)
RBC: 4.71 MIL/uL (ref 3.87–5.11)
RDW: 14.7 % (ref 11.5–15.5)
WBC: 6.1 10*3/uL (ref 4.0–10.5)

## 2015-02-09 LAB — URINALYSIS, ROUTINE W REFLEX MICROSCOPIC
Bilirubin Urine: NEGATIVE
GLUCOSE, UA: NEGATIVE mg/dL
Hgb urine dipstick: NEGATIVE
KETONES UR: NEGATIVE mg/dL
LEUKOCYTES UA: NEGATIVE
NITRITE: NEGATIVE
PROTEIN: NEGATIVE mg/dL
Specific Gravity, Urine: >1.03 — ABNORMAL HIGH (ref 1.005–1.030)
Urobilinogen, UA: 0.2 mg/dL (ref 0.0–1.0)
pH: 5.5 (ref 5.0–8.0)

## 2015-02-09 LAB — LIPASE, BLOOD: LIPASE: 25 U/L (ref 11–51)

## 2015-02-09 LAB — PREGNANCY, URINE: Preg Test, Ur: NEGATIVE

## 2015-02-09 MED ORDER — ONDANSETRON HCL 4 MG PO TABS: 4.0000 mg | ORAL_TABLET | Freq: Four times a day (QID) | ORAL | Status: DC

## 2015-02-09 MED ORDER — PROMETHAZINE HCL 25 MG RE SUPP: 25.0000 mg | Freq: Four times a day (QID) | RECTAL | Status: DC | PRN

## 2015-02-09 MED ORDER — SODIUM CHLORIDE 0.9 % IV BOLUS (SEPSIS)
1000.0000 mL | Freq: Once | INTRAVENOUS | Status: AC
  Administered 2015-02-09: 1000 mL via INTRAVENOUS

## 2015-02-09 MED ORDER — ONDANSETRON HCL 4 MG/2ML IJ SOLN
4.0000 mg | Freq: Once | INTRAMUSCULAR | Status: AC
  Administered 2015-02-09: 4 mg via INTRAVENOUS

## 2015-02-09 MED ORDER — PROMETHAZINE HCL 25 MG/ML IJ SOLN
12.5000 mg | Freq: Once | INTRAMUSCULAR | Status: AC
  Administered 2015-02-09: 12.5 mg via INTRAVENOUS
  Filled 2015-02-09: qty 1

## 2015-02-09 MED ORDER — ONDANSETRON HCL 4 MG/2ML IJ SOLN
4.0000 mg | Freq: Once | INTRAMUSCULAR | Status: AC
  Administered 2015-02-09: 4 mg via INTRAVENOUS
  Filled 2015-02-09: qty 2

## 2015-02-09 MED ORDER — ONDANSETRON HCL 4 MG/2ML IJ SOLN
4.0000 mg | Freq: Once | INTRAMUSCULAR | Status: DC
  Filled 2015-02-09: qty 2

## 2015-02-09 MED ORDER — HYDROMORPHONE HCL 1 MG/ML IJ SOLN
1.0000 mg | Freq: Once | INTRAMUSCULAR | Status: AC
  Administered 2015-02-09: 1 mg via INTRAVENOUS
  Filled 2015-02-09: qty 1
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
## 2015-02-09 NOTE — ED Notes (Addendum)
Patient complaining of upper abdominal pain and vomiting since last night. Patient has history of pancreatitis and states symptoms are the same.

## 2015-02-09 NOTE — ED Notes (Signed)
Patient is resting comfortably. 

## 2015-02-09 NOTE — Discharge Instructions (Signed)
Return to the ED with any concerns including vomiting and not able to keep down liquids, worsening abdominal pain, fever/chills, decreased level of alertness/lethargy, or any other alarming symptoms °

## 2015-02-17 ENCOUNTER — Telehealth: Payer: Self-pay | Admitting: Gastroenterology

## 2015-02-17 ENCOUNTER — Encounter: Payer: Self-pay | Admitting: Gastroenterology

## 2015-02-17 NOTE — Telephone Encounter (Signed)
Patient has been seen multiple times in ED. Please arrange a non-urgent visit with our practice.

## 2015-02-17 NOTE — Telephone Encounter (Signed)
APPT MADE AND LETTER SENT  °

## 2015-02-21 ENCOUNTER — Encounter (HOSPITAL_COMMUNITY)
Admission: RE | Admit: 2015-02-21 | Discharge: 2015-02-21 | Disposition: A | Discharge status: Home / Self Care | Payer: 59 | Source: Ambulatory Visit | Attending: Obstetrics and Gynecology | Admitting: Obstetrics and Gynecology

## 2015-03-11 ENCOUNTER — Encounter: Payer: Self-pay | Admitting: Gastroenterology

## 2015-03-11 ENCOUNTER — Telehealth: Payer: Self-pay | Admitting: Gastroenterology

## 2015-03-11 ENCOUNTER — Ambulatory Visit: Payer: Medicaid Other | Admitting: Gastroenterology

## 2015-03-11 NOTE — Telephone Encounter (Signed)
PATIENT WAS A NO SHOW AND LETTER SENT  °

## 2015-03-23 ENCOUNTER — Encounter (HOSPITAL_COMMUNITY)
Admission: RE | Admit: 2015-03-23 | Discharge: 2015-03-23 | Disposition: A | Discharge status: Home / Self Care | Payer: 59 | Source: Ambulatory Visit | Attending: Obstetrics and Gynecology | Admitting: Obstetrics and Gynecology

## 2015-04-16 ENCOUNTER — Emergency Department (HOSPITAL_COMMUNITY): Payer: Medicaid Other

## 2015-04-16 ENCOUNTER — Emergency Department (HOSPITAL_COMMUNITY)
Admission: EM | Admit: 2015-04-16 | Discharge: 2015-04-16 | Disposition: A | Discharge status: Home / Self Care | Payer: Medicaid Other | Attending: Emergency Medicine | Admitting: Emergency Medicine

## 2015-04-16 ENCOUNTER — Encounter (HOSPITAL_COMMUNITY): Payer: Self-pay | Admitting: Emergency Medicine

## 2015-04-16 DIAGNOSIS — I10 Essential (primary) hypertension: Secondary | ICD-10-CM | POA: Insufficient documentation

## 2015-04-16 DIAGNOSIS — Y998 Other external cause status: Secondary | ICD-10-CM | POA: Insufficient documentation

## 2015-04-16 DIAGNOSIS — Z862 Personal history of diseases of the blood and blood-forming organs and certain disorders involving the immune mechanism: Secondary | ICD-10-CM | POA: Insufficient documentation

## 2015-04-16 DIAGNOSIS — Z8719 Personal history of other diseases of the digestive system: Secondary | ICD-10-CM | POA: Insufficient documentation

## 2015-04-16 DIAGNOSIS — S93402A Sprain of unspecified ligament of left ankle, initial encounter: Secondary | ICD-10-CM | POA: Insufficient documentation

## 2015-04-16 DIAGNOSIS — W108XXA Fall (on) (from) other stairs and steps, initial encounter: Secondary | ICD-10-CM | POA: Diagnosis not present

## 2015-04-16 DIAGNOSIS — Z8619 Personal history of other infectious and parasitic diseases: Secondary | ICD-10-CM | POA: Insufficient documentation

## 2015-04-16 DIAGNOSIS — Z8701 Personal history of pneumonia (recurrent): Secondary | ICD-10-CM | POA: Insufficient documentation

## 2015-04-16 DIAGNOSIS — Z8659 Personal history of other mental and behavioral disorders: Secondary | ICD-10-CM | POA: Diagnosis not present

## 2015-04-16 DIAGNOSIS — G8929 Other chronic pain: Secondary | ICD-10-CM | POA: Insufficient documentation

## 2015-04-16 DIAGNOSIS — Y9289 Other specified places as the place of occurrence of the external cause: Secondary | ICD-10-CM | POA: Insufficient documentation

## 2015-04-16 DIAGNOSIS — S3991XA Unspecified injury of abdomen, initial encounter: Secondary | ICD-10-CM | POA: Insufficient documentation

## 2015-04-16 DIAGNOSIS — Z8739 Personal history of other diseases of the musculoskeletal system and connective tissue: Secondary | ICD-10-CM | POA: Diagnosis not present

## 2015-04-16 DIAGNOSIS — F17210 Nicotine dependence, cigarettes, uncomplicated: Secondary | ICD-10-CM | POA: Diagnosis not present

## 2015-04-16 DIAGNOSIS — R109 Unspecified abdominal pain: Secondary | ICD-10-CM

## 2015-04-16 DIAGNOSIS — S99912A Unspecified injury of left ankle, initial encounter: Secondary | ICD-10-CM | POA: Diagnosis present

## 2015-04-16 DIAGNOSIS — Y9389 Activity, other specified: Secondary | ICD-10-CM | POA: Insufficient documentation

## 2015-04-16 LAB — COMPREHENSIVE METABOLIC PANEL
ALBUMIN: 5 g/dL (ref 3.5–5.0)
ALT: 13 U/L — AB (ref 14–54)
AST: 14 U/L — AB (ref 15–41)
Alkaline Phosphatase: 82 U/L (ref 38–126)
Anion gap: 8 (ref 5–15)
BILIRUBIN TOTAL: 0.5 mg/dL (ref 0.3–1.2)
BUN: 13 mg/dL (ref 6–20)
CALCIUM: 9.8 mg/dL (ref 8.9–10.3)
CO2: 25 mmol/L (ref 22–32)
CREATININE: 0.61 mg/dL (ref 0.44–1.00)
Chloride: 104 mmol/L (ref 101–111)
Glucose, Bld: 118 mg/dL — ABNORMAL HIGH (ref 65–99)
Potassium: 4.1 mmol/L (ref 3.5–5.1)
Sodium: 137 mmol/L (ref 135–145)
TOTAL PROTEIN: 8.4 g/dL — AB (ref 6.5–8.1)

## 2015-04-16 LAB — CBC WITH DIFFERENTIAL/PLATELET
BASOS ABS: 0 10*3/uL (ref 0.0–0.1)
Basophils Relative: 0 %
EOS ABS: 0.1 10*3/uL (ref 0.0–0.7)
EOS PCT: 1 %
HCT: 41.9 % (ref 36.0–46.0)
Hemoglobin: 13.8 g/dL (ref 12.0–15.0)
Lymphocytes Relative: 25 %
Lymphs Abs: 2.5 10*3/uL (ref 0.7–4.0)
MCH: 29.1 pg (ref 26.0–34.0)
MCHC: 32.9 g/dL (ref 30.0–36.0)
MCV: 88.2 fL (ref 78.0–100.0)
Monocytes Absolute: 1.1 10*3/uL — ABNORMAL HIGH (ref 0.1–1.0)
Monocytes Relative: 11 %
Neutro Abs: 6.4 10*3/uL (ref 1.7–7.7)
Neutrophils Relative %: 63 %
PLATELETS: 405 10*3/uL — AB (ref 150–400)
RBC: 4.75 MIL/uL (ref 3.87–5.11)
RDW: 14.9 % (ref 11.5–15.5)
WBC: 10 10*3/uL (ref 4.0–10.5)

## 2015-04-16 LAB — LIPASE, BLOOD: LIPASE: 24 U/L (ref 11–51)

## 2015-04-16 MED ORDER — HYDROMORPHONE HCL 1 MG/ML IJ SOLN
1.0000 mg | Freq: Once | INTRAMUSCULAR | Status: AC
  Administered 2015-04-16: 1 mg via INTRAVENOUS
  Filled 2015-04-16: qty 1

## 2015-04-16 MED ORDER — SODIUM CHLORIDE 0.9 % IV BOLUS (SEPSIS)
1000.0000 mL | Freq: Once | INTRAVENOUS | Status: AC
  Administered 2015-04-16: 1000 mL via INTRAVENOUS

## 2015-04-16 MED ORDER — ONDANSETRON HCL 4 MG/2ML IJ SOLN
4.0000 mg | Freq: Once | INTRAMUSCULAR | Status: AC
  Administered 2015-04-16: 4 mg via INTRAVENOUS
  Filled 2015-04-16: qty 2

## 2015-04-16 MED ORDER — DIPHENHYDRAMINE HCL 25 MG PO CAPS
25.0000 mg | ORAL_CAPSULE | Freq: Once | ORAL | Status: AC
  Administered 2015-04-16: 25 mg via ORAL
  Filled 2015-04-16: qty 1

## 2015-04-16 MED ORDER — ONDANSETRON 8 MG PO TBDP: 8.0000 mg | ORAL_TABLET | Freq: Three times a day (TID) | ORAL | Status: DC | PRN

## 2015-04-16 MED ORDER — OXYCODONE-ACETAMINOPHEN 5-325 MG PO TABS: 1.0000 | ORAL_TABLET | ORAL | Status: DC | PRN

## 2015-04-16 NOTE — ED Notes (Signed)
Pt c/o abd pain x 2 days and left ankle after falling down steps.

## 2015-04-16 NOTE — Discharge Instructions (Signed)
Ankle Sprain °An ankle sprain is an injury to the strong, fibrous tissues (ligaments) that hold the bones of your ankle joint together.  °CAUSES °An ankle sprain is usually caused by a fall or by twisting your ankle. Ankle sprains most commonly occur when you step on the outer edge of your foot, and your ankle turns inward. People who participate in sports are more prone to these types of injuries.  °SYMPTOMS  °· Pain in your ankle. The pain may be present at rest or only when you are trying to stand or walk. °· Swelling. °· Bruising. Bruising may develop immediately or within 1 to 2 days after your injury. °· Difficulty standing or walking, particularly when turning corners or changing directions. °DIAGNOSIS  °Your caregiver will ask you details about your injury and perform a physical exam of your ankle to determine if you have an ankle sprain. During the physical exam, your caregiver will press on and apply pressure to specific areas of your foot and ankle. Your caregiver will try to move your ankle in certain ways. An X-ray exam may be done to be sure a bone was not broken or a ligament did not separate from one of the bones in your ankle (avulsion fracture).  °TREATMENT  °Certain types of braces can help stabilize your ankle. Your caregiver can make a recommendation for this. Your caregiver may recommend the use of medicine for pain. If your sprain is severe, your caregiver may refer you to a surgeon who helps to restore function to parts of your skeletal system (orthopedist) or a physical therapist. °HOME CARE INSTRUCTIONS  °· Apply ice to your injury for 1-2 days or as directed by your caregiver. Applying ice helps to reduce inflammation and pain. °· Put ice in a plastic bag. °· Place a towel between your skin and the bag. °· Leave the ice on for 15-20 minutes at a time, every 2 hours while you are awake. °· Only take over-the-counter or prescription medicines for pain, discomfort, or fever as directed by  your caregiver. °· Elevate your injured ankle above the level of your heart as much as possible for 2-3 days. °· If your caregiver recommends crutches, use them as instructed. Gradually put weight on the affected ankle. Continue to use crutches or a cane until you can walk without feeling pain in your ankle. °· If you have a plaster splint, wear the splint as directed by your caregiver. Do not rest it on anything harder than a pillow for the first 24 hours. Do not put weight on it. Do not get it wet. You may take it off to take a shower or bath. °· You may have been given an elastic bandage to wear around your ankle to provide support. If the elastic bandage is too tight (you have numbness or tingling in your foot or your foot becomes cold and blue), adjust the bandage to make it comfortable. °· If you have an air splint, you may blow more air into it or let air out to make it more comfortable. You may take your splint off at night and before taking a shower or bath. Wiggle your toes in the splint several times per day to decrease swelling. °SEEK MEDICAL CARE IF:  °· You have rapidly increasing bruising or swelling. °· Your toes feel extremely cold or you lose feeling in your foot. °· Your pain is not relieved with medicine. °SEEK IMMEDIATE MEDICAL CARE IF: °· Your toes are numb or blue. °·   You have severe pain that is increasing. MAKE SURE YOU:   Understand these instructions.  Will watch your condition.  Will get help right away if you are not doing well or get worse.   This information is not intended to replace advice given to you by your health care provider. Make sure you discuss any questions you have with your health care provider.   Document Released: 04/05/2005 Document Revised: 04/26/2014 Document Reviewed: 04/17/2011 Elsevier Interactive Patient Education 2016 Elsevier Inc.  Abdominal Pain, Adult Many things can cause belly (abdominal) pain. Most times, the belly pain is not dangerous.  Many cases of belly pain can be watched and treated at home. HOME CARE   Do not take medicines that help you go poop (laxatives) unless told to by your doctor.  Only take medicine as told by your doctor.  Eat or drink as told by your doctor. Your doctor will tell you if you should be on a special diet. GET HELP IF:  You do not know what is causing your belly pain.  You have belly pain while you are sick to your stomach (nauseous) or have runny poop (diarrhea).  You have pain while you pee or poop.  Your belly pain wakes you up at night.  You have belly pain that gets worse or better when you eat.  You have belly pain that gets worse when you eat fatty foods.  You have a fever. GET HELP RIGHT AWAY IF:   The pain does not go away within 2 hours.  You keep throwing up (vomiting).  The pain changes and is only in the right or left part of the belly.  You have bloody or tarry looking poop. MAKE SURE YOU:   Understand these instructions.  Will watch your condition.  Will get help right away if you are not doing well or get worse.   This information is not intended to replace advice given to you by your health care provider. Make sure you discuss any questions you have with your health care provider.   Document Released: 09/22/2007 Document Revised: 04/26/2014 Document Reviewed: 12/13/2012 Elsevier Interactive Patient Education [employer]2016 Elsevier Inc.

## 2015-04-16 NOTE — ED Notes (Signed)
Pt and family updated on plan of care,  

## 2015-04-16 NOTE — ED Notes (Signed)
Pt states that the pain medication has not helped, Dr Effie ShyWentz notified, no additional orders given,
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
## 2015-04-16 NOTE — ED Notes (Signed)
Pt c/o epigastric pain that radiates to back area, pt also c/o left ankle pain after falling down the steps today,

## 2015-04-16 NOTE — ED Notes (Signed)
Ice pack placed on left ankle,

## 2015-04-16 NOTE — ED Notes (Signed)
Dr Wentz at bedside,  

## 2015-04-16 NOTE — ED Notes (Signed)
Pt states that the pain medication has not changed, requesting additional pain medication, Dr Effie ShyWentz notified, additional orders given,

## 2015-04-16 NOTE — ED Notes (Signed)
Pt family mbr out to nursing desk, asking about when edp would be in to see pt, that pt needed more pain medication, explained to family mbr that edp would be in as soon as possible, family mbr starts cursing at nursing desk,

## 2015-04-20 NOTE — L&D Delivery Note (Signed)
Delivery Note At 1:23 AM a non-viable female was delivered via Vaginal, Spontaneous Delivery (Presentation: OA ).  APGAR: 0, 0; weight  Pending. No apparent anomalies. No skin peeling.    Placenta status: spontaneous. Intact w/ entire maternal surface covered w/ dark adherent clot.  Cord: 3VC with the following complications: None.  Cord pH: NA.  Baby placed skin-to-skin w/ mom.   Anesthesia: Epidural  Episiotomy: None Lacerations: None Suture Repair: NA Est. Blood Loss (mL): 525  [place] to [facility].  [facility] Placenta to: Pathology Feeding: NA Circ: NA Contraception: Had planned interval salpingectomy. [facility].    Amber KinsmanVirginia Berlin Dunn 12/03/2015, 1:54 AM

## 2015-04-23 ENCOUNTER — Encounter (HOSPITAL_COMMUNITY)
Admission: RE | Admit: 2015-04-23 | Discharge: 2015-04-23 | Disposition: A | Discharge status: Home / Self Care | Payer: 59 | Source: Ambulatory Visit | Attending: Obstetrics and Gynecology | Admitting: Obstetrics and Gynecology

## 2015-04-26 ENCOUNTER — Emergency Department (HOSPITAL_COMMUNITY)
Admission: EM | Admit: 2015-04-26 | Discharge: 2015-04-26 | Disposition: A | Discharge status: Home / Self Care | Payer: Medicaid Other | Attending: Emergency Medicine | Admitting: Emergency Medicine

## 2015-04-26 ENCOUNTER — Encounter (HOSPITAL_COMMUNITY): Payer: Self-pay

## 2015-04-26 DIAGNOSIS — Z862 Personal history of diseases of the blood and blood-forming organs and certain disorders involving the immune mechanism: Secondary | ICD-10-CM | POA: Insufficient documentation

## 2015-04-26 DIAGNOSIS — Z8659 Personal history of other mental and behavioral disorders: Secondary | ICD-10-CM | POA: Insufficient documentation

## 2015-04-26 DIAGNOSIS — G8929 Other chronic pain: Secondary | ICD-10-CM | POA: Insufficient documentation

## 2015-04-26 DIAGNOSIS — Z8619 Personal history of other infectious and parasitic diseases: Secondary | ICD-10-CM | POA: Diagnosis not present

## 2015-04-26 DIAGNOSIS — Z8739 Personal history of other diseases of the musculoskeletal system and connective tissue: Secondary | ICD-10-CM | POA: Insufficient documentation

## 2015-04-26 DIAGNOSIS — R1013 Epigastric pain: Secondary | ICD-10-CM | POA: Diagnosis not present

## 2015-04-26 DIAGNOSIS — Z8719 Personal history of other diseases of the digestive system: Secondary | ICD-10-CM | POA: Insufficient documentation

## 2015-04-26 DIAGNOSIS — F17210 Nicotine dependence, cigarettes, uncomplicated: Secondary | ICD-10-CM | POA: Insufficient documentation

## 2015-04-26 DIAGNOSIS — R112 Nausea with vomiting, unspecified: Secondary | ICD-10-CM | POA: Insufficient documentation

## 2015-04-26 DIAGNOSIS — I10 Essential (primary) hypertension: Secondary | ICD-10-CM | POA: Diagnosis not present

## 2015-04-26 DIAGNOSIS — Z8701 Personal history of pneumonia (recurrent): Secondary | ICD-10-CM | POA: Insufficient documentation

## 2015-04-26 DIAGNOSIS — Z8669 Personal history of other diseases of the nervous system and sense organs: Secondary | ICD-10-CM | POA: Insufficient documentation

## 2015-04-26 DIAGNOSIS — R1010 Upper abdominal pain, unspecified: Secondary | ICD-10-CM | POA: Diagnosis present

## 2015-04-26 LAB — CBC WITH DIFFERENTIAL/PLATELET
BASOS ABS: 0 10*3/uL (ref 0.0–0.1)
BASOS PCT: 0 %
EOS ABS: 0 10*3/uL (ref 0.0–0.7)
Eosinophils Relative: 1 %
HEMATOCRIT: 40.9 % (ref 36.0–46.0)
Hemoglobin: 13.9 g/dL (ref 12.0–15.0)
Lymphocytes Relative: 23 %
Lymphs Abs: 1.4 10*3/uL (ref 0.7–4.0)
MCH: 30 pg (ref 26.0–34.0)
MCHC: 34 g/dL (ref 30.0–36.0)
MCV: 88.1 fL (ref 78.0–100.0)
MONO ABS: 0.7 10*3/uL (ref 0.1–1.0)
Monocytes Relative: 11 %
NEUTROS ABS: 3.9 10*3/uL (ref 1.7–7.7)
NEUTROS PCT: 65 %
Platelets: 311 10*3/uL (ref 150–400)
RBC: 4.64 MIL/uL (ref 3.87–5.11)
RDW: 15.2 % (ref 11.5–15.5)
WBC: 6 10*3/uL (ref 4.0–10.5)

## 2015-04-26 LAB — COMPREHENSIVE METABOLIC PANEL
ALT: 18 U/L (ref 14–54)
ANION GAP: 11 (ref 5–15)
AST: 19 U/L (ref 15–41)
Albumin: 4.6 g/dL (ref 3.5–5.0)
Alkaline Phosphatase: 83 U/L (ref 38–126)
BILIRUBIN TOTAL: 0.5 mg/dL (ref 0.3–1.2)
BUN: 11 mg/dL (ref 6–20)
CO2: 23 mmol/L (ref 22–32)
Calcium: 9.2 mg/dL (ref 8.9–10.3)
Chloride: 103 mmol/L (ref 101–111)
Creatinine, Ser: 0.66 mg/dL (ref 0.44–1.00)
GFR calc Af Amer: >60 mL/min (ref >60)
Glucose, Bld: 99 mg/dL (ref 65–99)
POTASSIUM: 3.7 mmol/L (ref 3.5–5.1)
Sodium: 137 mmol/L (ref 135–145)
TOTAL PROTEIN: 8 g/dL (ref 6.5–8.1)

## 2015-04-26 LAB — LIPASE, BLOOD: LIPASE: 17 U/L (ref 11–51)

## 2015-04-26 MED ORDER — OXYCODONE-ACETAMINOPHEN 5-325 MG PO TABS: 1.0000 | ORAL_TABLET | ORAL | Status: DC | PRN

## 2015-04-26 MED ORDER — DIPHENHYDRAMINE HCL 50 MG/ML IJ SOLN
25.0000 mg | Freq: Once | INTRAMUSCULAR | Status: AC
  Administered 2015-04-26: 25 mg via INTRAVENOUS
  Filled 2015-04-26: qty 1

## 2015-04-26 MED ORDER — ONDANSETRON 4 MG PREPACK (~~LOC~~): ORAL_TABLET | ORAL | Status: DC

## 2015-04-26 MED ORDER — ONDANSETRON HCL 4 MG/2ML IJ SOLN
4.0000 mg | Freq: Once | INTRAMUSCULAR | Status: AC
  Administered 2015-04-26: 4 mg via INTRAVENOUS
  Filled 2015-04-26: qty 2

## 2015-04-26 MED ORDER — HYDROMORPHONE HCL 1 MG/ML IJ SOLN
1.0000 mg | INTRAMUSCULAR | Status: DC | PRN
Start: 2015-04-26 — End: 2015-04-26
  Administered 2015-04-26 (×2): 1 mg via INTRAVENOUS
  Filled 2015-04-26 (×2): qty 1

## 2015-04-26 MED ORDER — ONDANSETRON 8 MG PO TBDP: ORAL_TABLET | ORAL | Status: DC

## 2015-04-26 MED ORDER — SODIUM CHLORIDE 0.9 % IV BOLUS (SEPSIS)
500.0000 mL | Freq: Once | INTRAVENOUS | Status: AC
  Administered 2015-04-26: 500 mL via INTRAVENOUS

## 2015-04-26 NOTE — ED Notes (Signed)
C/O upper abd pain radiating through to back with n/v since last night.  Denies diarrhea.

## 2015-04-26 NOTE — ED Notes (Signed)
Pt is requesting something something else for pain.
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
## 2015-04-26 NOTE — ED Notes (Signed)
Discharge instructions given to pt ,reviewed dispensed medications with pt and her husband. Verbalized understanding. Ambulated off unit

## 2015-04-28 MED FILL — Oxycodone w/ Acetaminophen Tab 5-325 MG: ORAL | Qty: 6 | Status: AC

## 2015-04-28 MED FILL — Ondansetron HCl Tab 4 MG: ORAL | Qty: 4 | Status: AC

## 2015-05-03 ENCOUNTER — Encounter (HOSPITAL_COMMUNITY): Payer: Self-pay

## 2015-05-03 ENCOUNTER — Emergency Department (HOSPITAL_COMMUNITY): Payer: Medicaid Other

## 2015-05-03 ENCOUNTER — Emergency Department (HOSPITAL_COMMUNITY)
Admission: EM | Admit: 2015-05-03 | Discharge: 2015-05-03 | Disposition: A | Discharge status: Home / Self Care | Payer: Medicaid Other | Attending: Emergency Medicine | Admitting: Emergency Medicine

## 2015-05-03 DIAGNOSIS — I10 Essential (primary) hypertension: Secondary | ICD-10-CM | POA: Insufficient documentation

## 2015-05-03 DIAGNOSIS — Z331 Pregnant state, incidental: Secondary | ICD-10-CM | POA: Insufficient documentation

## 2015-05-03 DIAGNOSIS — Z8739 Personal history of other diseases of the musculoskeletal system and connective tissue: Secondary | ICD-10-CM | POA: Diagnosis not present

## 2015-05-03 DIAGNOSIS — Z8719 Personal history of other diseases of the digestive system: Secondary | ICD-10-CM | POA: Insufficient documentation

## 2015-05-03 DIAGNOSIS — F17210 Nicotine dependence, cigarettes, uncomplicated: Secondary | ICD-10-CM | POA: Diagnosis not present

## 2015-05-03 DIAGNOSIS — Z8619 Personal history of other infectious and parasitic diseases: Secondary | ICD-10-CM | POA: Insufficient documentation

## 2015-05-03 DIAGNOSIS — G8929 Other chronic pain: Secondary | ICD-10-CM | POA: Insufficient documentation

## 2015-05-03 DIAGNOSIS — Z8701 Personal history of pneumonia (recurrent): Secondary | ICD-10-CM | POA: Diagnosis not present

## 2015-05-03 DIAGNOSIS — Z8659 Personal history of other mental and behavioral disorders: Secondary | ICD-10-CM | POA: Insufficient documentation

## 2015-05-03 DIAGNOSIS — Z3490 Encounter for supervision of normal pregnancy, unspecified, unspecified trimester: Secondary | ICD-10-CM

## 2015-05-03 DIAGNOSIS — R102 Pelvic and perineal pain: Secondary | ICD-10-CM

## 2015-05-03 DIAGNOSIS — R1013 Epigastric pain: Secondary | ICD-10-CM | POA: Diagnosis present

## 2015-05-03 DIAGNOSIS — R112 Nausea with vomiting, unspecified: Secondary | ICD-10-CM | POA: Diagnosis not present

## 2015-05-03 DIAGNOSIS — R109 Unspecified abdominal pain: Secondary | ICD-10-CM

## 2015-05-03 LAB — URINALYSIS, ROUTINE W REFLEX MICROSCOPIC
GLUCOSE, UA: NEGATIVE mg/dL
HGB URINE DIPSTICK: NEGATIVE
KETONES UR: 15 mg/dL — AB
Leukocytes, UA: NEGATIVE
Nitrite: NEGATIVE
PROTEIN: NEGATIVE mg/dL
Specific Gravity, Urine: >1.03 — ABNORMAL HIGH (ref 1.005–1.030)
pH: 6 (ref 5.0–8.0)

## 2015-05-03 LAB — POC URINE PREG, ED: PREG TEST UR: POSITIVE — AB

## 2015-05-03 LAB — HCG, QUANTITATIVE, PREGNANCY: HCG, BETA CHAIN, QUANT, S: 3134 m[IU]/mL — AB (ref <5)

## 2015-05-03 MED ORDER — ONDANSETRON 8 MG PO TBDP
8.0000 mg | ORAL_TABLET | Freq: Once | ORAL | Status: AC
  Administered 2015-05-03: 8 mg via ORAL
  Filled 2015-05-03: qty 1

## 2015-05-03 MED ORDER — PROMETHAZINE HCL 12.5 MG PO TABS
25.0000 mg | ORAL_TABLET | Freq: Once | ORAL | Status: AC
  Administered 2015-05-03: 25 mg via ORAL
  Filled 2015-05-03: qty 2

## 2015-05-03 MED ORDER — PROMETHAZINE HCL 25 MG PO TABS: 25.0000 mg | ORAL_TABLET | Freq: Four times a day (QID) | ORAL | Status: DC | PRN

## 2015-05-03 MED ORDER — PRENATAL COMPLETE 14-0.4 MG PO TABS: 1.0000 | ORAL_TABLET | Freq: Every day | ORAL | Status: DC

## 2015-05-03 MED ORDER — PROMETHAZINE HCL 25 MG/ML IJ SOLN
12.5000 mg | Freq: Once | INTRAMUSCULAR | Status: AC
  Administered 2015-05-03: 12.5 mg via INTRAVENOUS
  Filled 2015-05-03: qty 1

## 2015-05-03 MED ORDER — SODIUM CHLORIDE 0.9 % IV BOLUS (SEPSIS)
1000.0000 mL | Freq: Once | INTRAVENOUS | Status: AC
  Administered 2015-05-03: 1000 mL via INTRAVENOUS

## 2015-05-03 NOTE — ED Notes (Signed)
Pt states she thinks this pain is a flare up of her pancreatitis, vomiting clear yellow

## 2015-05-03 NOTE — Discharge Instructions (Signed)
PLEASE SEE YOUR OBGYN FOR FOLLOWUP IN 2 DAYS  RETURN FOR WORSENED PAIN IN YOUR ABDOMEN, ANY LOWER ABDOMINAL PAIN OR ANY NEW VAGINAL BLEEDING OVER NEXT 24 HOURS

## 2015-05-03 NOTE — ED Notes (Signed)
Pt called out requesting pain medication, pt updated on delay,

## 2015-05-03 NOTE — ED Notes (Signed)
Pt c/o continued abd pain with n/v, states that it became worse tonight, [place] [facility]
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
## 2015-05-03 NOTE — ED Notes (Signed)
Dr Wickline at bedside,  

## 2015-05-05 ENCOUNTER — Encounter: Payer: Self-pay | Admitting: Adult Health

## 2015-05-05 ENCOUNTER — Ambulatory Visit (INDEPENDENT_AMBULATORY_CARE_PROVIDER_SITE_OTHER): Payer: Medicaid Other | Admitting: Adult Health

## 2015-05-05 VITALS — BP 100/60 | HR 84 | Ht 64.0 in | Wt 120.5 lb

## 2015-05-05 DIAGNOSIS — Z3201 Encounter for pregnancy test, result positive: Secondary | ICD-10-CM | POA: Diagnosis not present

## 2015-05-05 DIAGNOSIS — Z8719 Personal history of other diseases of the digestive system: Secondary | ICD-10-CM

## 2015-05-05 DIAGNOSIS — Z3490 Encounter for supervision of normal pregnancy, unspecified, unspecified trimester: Secondary | ICD-10-CM

## 2015-05-05 DIAGNOSIS — R112 Nausea with vomiting, unspecified: Secondary | ICD-10-CM

## 2015-05-05 DIAGNOSIS — O09891 Supervision of other high risk pregnancies, first trimester: Secondary | ICD-10-CM

## 2015-05-05 DIAGNOSIS — O09899 Supervision of other high risk pregnancies, unspecified trimester: Secondary | ICD-10-CM | POA: Insufficient documentation

## 2015-05-05 DIAGNOSIS — O09211 Supervision of pregnancy with history of pre-term labor, first trimester: Secondary | ICD-10-CM

## 2015-05-05 DIAGNOSIS — O3680X0 Pregnancy with inconclusive fetal viability, not applicable or unspecified: Secondary | ICD-10-CM

## 2015-05-05 DIAGNOSIS — O09219 Supervision of pregnancy with history of pre-term labor, unspecified trimester: Secondary | ICD-10-CM

## 2015-05-05 HISTORY — DX: Nausea with vomiting, unspecified: R11.2

## 2015-05-05 HISTORY — DX: Supervision of other high risk pregnancies, first trimester: O09.891

## 2015-05-05 LAB — POCT URINE PREGNANCY: Preg Test, Ur: POSITIVE — AB

## 2015-05-05 MED ORDER — DOXYLAMINE-PYRIDOXINE 10-10 MG PO TBEC: DELAYED_RELEASE_TABLET | ORAL | Status: DC

## 2015-05-05 NOTE — Patient Instructions (Signed)
First Trimester of Pregnancy The first trimester of pregnancy is from week 1 until the end of week 12 (months 1 through 3). A week after a sperm fertilizes an egg, the egg will implant on the wall of the uterus. This embryo will begin to develop into a baby. Genes from you and your partner are forming the baby. The female genes determine whether the baby is a boy or a girl. At 6-8 weeks, the eyes and face are formed, and the heartbeat can be seen on ultrasound. At the end of 12 weeks, all the baby's organs are formed.  Now that you are pregnant, you will want to do everything you can to have a healthy baby. Two of the most important things are to get good prenatal care and to follow your health care provider's instructions. Prenatal care is all the medical care you receive before the baby's birth. This care will help prevent, find, and treat any problems during the pregnancy and childbirth. BODY CHANGES Your body goes through many changes during pregnancy. The changes vary from woman to woman.   You may gain or lose a couple of pounds at first.  You may feel sick to your stomach (nauseous) and throw up (vomit). If the vomiting is uncontrollable, call your health care provider.  You may tire easily.  You may develop headaches that can be relieved by medicines approved by your health care provider.  You may urinate more often. Painful urination may mean you have a bladder infection.  You may develop heartburn as a result of your pregnancy.  You may develop constipation because certain hormones are causing the muscles that push waste through your intestines to slow down.  You may develop hemorrhoids or swollen, bulging veins (varicose veins).  Your breasts may begin to grow larger and become tender. Your nipples may stick out more, and the tissue that surrounds them (areola) may become darker.  Your gums may bleed and may be sensitive to brushing and flossing.  Dark spots or blotches (chloasma,  mask of pregnancy) may develop on your face. This will likely fade after the baby is born.  Your menstrual periods will stop.  You may have a loss of appetite.  You may develop cravings for certain kinds of food.  You may have changes in your emotions from day to day, such as being excited to be pregnant or being concerned that something may go wrong with the pregnancy and baby.  You may have more vivid and strange dreams.  You may have changes in your hair. These can include thickening of your hair, rapid growth, and changes in texture. Some women also have hair loss during or after pregnancy, or hair that feels dry or thin. Your hair will most likely return to normal after your baby is born. WHAT TO EXPECT AT YOUR PRENATAL VISITS During a routine prenatal visit:  You will be weighed to make sure you and the baby are growing normally.  Your blood pressure will be taken.  Your abdomen will be measured to track your baby's growth.  The fetal heartbeat will be listened to starting around week 10 or 12 of your pregnancy.  Test results from any previous visits will be discussed. Your health care provider may ask you:  How you are feeling.  If you are feeling the baby move.  If you have had any abnormal symptoms, such as leaking fluid, bleeding, severe headaches, or abdominal cramping.  If you are using any tobacco products,   including cigarettes, chewing tobacco, and electronic cigarettes.  If you have any questions. Other tests that may be performed during your first trimester include:  Blood tests to find your blood type and to check for the presence of any previous infections. They will also be used to check for low iron levels (anemia) and Rh antibodies. Later in the pregnancy, blood tests for diabetes will be done along with other tests if problems develop.  Urine tests to check for infections, diabetes, or protein in the urine.  An ultrasound to confirm the proper growth  and development of the baby.  An amniocentesis to check for possible genetic problems.  Fetal screens for spina bifida and Down syndrome.  You may need other tests to make sure you and the baby are doing well.  HIV (human immunodeficiency virus) testing. Routine prenatal testing includes screening for HIV, unless you choose not to have this test. HOME CARE INSTRUCTIONS  Medicines  Follow your health care provider's instructions regarding medicine use. Specific medicines may be either safe or unsafe to take during pregnancy.  Take your prenatal vitamins as directed.  If you develop constipation, try taking a stool softener if your health care provider approves. Diet  Eat regular, well-balanced meals. Choose a variety of foods, such as meat or vegetable-based protein, fish, milk and low-fat dairy products, vegetables, fruits, and whole grain breads and cereals. Your health care provider will help you determine the amount of weight gain that is right for you.  Avoid raw meat and uncooked cheese. These carry germs that can cause birth defects in the baby.  Eating four or five small meals rather than three large meals a day may help relieve nausea and vomiting. If you start to feel nauseous, eating a few soda crackers can be helpful. Drinking liquids between meals instead of during meals also seems to help nausea and vomiting.  If you develop constipation, eat more high-fiber foods, such as fresh vegetables or fruit and whole grains. Drink enough fluids to keep your urine clear or pale yellow. Activity and Exercise  Exercise only as directed by your health care provider. Exercising will help you:  Control your weight.  Stay in shape.  Be prepared for labor and delivery.  Experiencing pain or cramping in the lower abdomen or low back is a good sign that you should stop exercising. Check with your health care provider before continuing normal exercises.  Try to avoid standing for long  periods of time. Move your legs often if you must stand in one place for a long time.  Avoid heavy lifting.  Wear low-heeled shoes, and practice good posture.  You may continue to have sex unless your health care provider directs you otherwise. Relief of Pain or Discomfort  Wear a good support bra for breast tenderness.   Take warm sitz baths to soothe any pain or discomfort caused by hemorrhoids. Use hemorrhoid cream if your health care provider approves.   Rest with your legs elevated if you have leg cramps or low back pain.  If you develop varicose veins in your legs, wear support hose. Elevate your feet for 15 minutes, 3-4 times a day. Limit salt in your diet. Prenatal Care  Schedule your prenatal visits by the twelfth week of pregnancy. They are usually scheduled monthly at first, then more often in the last 2 months before delivery.  Write down your questions. Take them to your prenatal visits.  Keep all your prenatal visits as directed by your   health care provider. Safety  Wear your seat belt at all times when driving.  Make a list of emergency phone numbers, including numbers for family, friends, the hospital, and police and fire departments. General Tips  Ask your health care provider for a referral to a local prenatal education class. Begin classes no later than at the beginning of month 6 of your pregnancy.  Ask for help if you have counseling or nutritional needs during pregnancy. Your health care provider can offer advice or refer you to specialists for help with various needs.  Do not use hot tubs, steam rooms, or saunas.  Do not douche or use tampons or scented sanitary pads.  Do not cross your legs for long periods of time.  Avoid cat litter boxes and soil used by cats. These carry germs that can cause birth defects in the baby and possibly loss of the fetus by miscarriage or stillbirth.  Avoid all smoking, herbs, alcohol, and medicines not prescribed by  your health care provider. Chemicals in these affect the formation and growth of the baby.  Do not use any tobacco products, including cigarettes, chewing tobacco, and electronic cigarettes. If you need help quitting, ask your health care provider. You may receive counseling support and other resources to help you quit.  Schedule a dentist appointment. At home, brush your teeth with a soft toothbrush and be gentle when you floss. SEEK MEDICAL CARE IF:   You have dizziness.  You have mild pelvic cramps, pelvic pressure, or nagging pain in the abdominal area.  You have persistent nausea, vomiting, or diarrhea.  You have a bad smelling vaginal discharge.  You have pain with urination.  You notice increased swelling in your face, hands, legs, or ankles. SEEK IMMEDIATE MEDICAL CARE IF:   You have a fever.  You are leaking fluid from your vagina.  You have spotting or bleeding from your vagina.  You have severe abdominal cramping or pain.  You have rapid weight gain or loss.  You vomit blood or material that looks like coffee grounds.  You are exposed to [place] measles and have never had them.  You are exposed to fifth disease or chickenpox.  You develop a severe headache.  You have shortness of breath.  You have any kind of trauma, such as from a fall or a car accident.   This information is not intended to replace advice given to you by your health care provider. Make sure you discuss any questions you have with your health care provider.   Document Released: 03/30/2001 Document Revised: 04/26/2014 Document Reviewed: 02/13/2013 Elsevier Interactive Patient Education [employer] Return in 1 week for US  Take diclegis

## 2015-05-05 NOTE — Progress Notes (Signed)
Subjective:     Patient ID: [name], female   DOB: May 20, 1985, 30 y.o.   MRN: [id number]  HPI [name] is a 30 year old white female, in for UPT, had +HPT and has had nausea and vomiting, had abdominal pain and went to ER 05/03/15, has history of pancreatitis.No bleeding stomach still sore and still has nausea and vomiting.QHCG in ER 3134 and US showed cyst on left ovary, no definite IUP.She has 739 month old at home and has history of preterm labor with delivery at 32 weeks and had 24 week delivery, and baby died. She says she wants no more babies after this one.   Review of Systems Patient denies any headaches, hearing loss, fatigue, blurred vision, shortness of breath, chest pain,  problems with bowel movements, urination, or intercourse. No joint pain or mood swings.See HPI for positives. Reviewed past medical,surgical, social and family history. Reviewed medications and allergies.     Objective:   Physical Exam BP 100/60 mmHg  Pulse 84  Ht 5\' 4"  (1.626 m)  Wt 120 lb 8 oz (54.658 kg)  BMI 20.67 kg/m2  LMP 04/04/2015  Breastfeeding? No UPT + about 4+3 weeks by LMP with EDD 01/09/16.Skin warm and dry. Neck: mid line trachea, normal thyroid, good ROM, no lymphadenopathy noted. Lungs: clear to ausculation bilaterally. Cardiovascular: regular rate and rhythm.Abdomen low non tender.    Assessment:     Pregnant Nausea and vomiting History of preterm delivery History of pancreatitis    Plan:     Rx diclegis #180 with 1 refill, take as directed, 12 tabs given lot [id number], lot 11/16/16  Check QHCG Return in 1 week for dating US Review handout on first trimester

## 2015-05-06 ENCOUNTER — Telehealth: Payer: Self-pay | Admitting: Adult Health

## 2015-05-06 LAB — BETA HCG QUANT (REF LAB): HCG QUANT: 5392 m[IU]/mL

## 2015-05-06 NOTE — Telephone Encounter (Signed)
Left message about labs, keep [place] appt

## 2015-05-09 ENCOUNTER — Ambulatory Visit: Payer: Medicaid Other | Admitting: Gastroenterology

## 2015-05-09 ENCOUNTER — Encounter: Payer: Self-pay | Admitting: Gastroenterology

## 2015-05-12 ENCOUNTER — Ambulatory Visit (INDEPENDENT_AMBULATORY_CARE_PROVIDER_SITE_OTHER): Payer: Medicaid Other

## 2015-05-12 DIAGNOSIS — O3680X0 Pregnancy with inconclusive fetal viability, not applicable or unspecified: Secondary | ICD-10-CM | POA: Diagnosis not present

## 2015-05-12 NOTE — Progress Notes (Signed)
[place] 5+3wks single IUP w/ys,pos fht 115 bpm,normal ov's bilat,crl 5.70mm

## 2015-05-16 ENCOUNTER — Encounter (HOSPITAL_COMMUNITY): Payer: Self-pay

## 2015-05-16 DIAGNOSIS — Z79899 Other long term (current) drug therapy: Secondary | ICD-10-CM | POA: Diagnosis not present

## 2015-05-16 DIAGNOSIS — Z862 Personal history of diseases of the blood and blood-forming organs and certain disorders involving the immune mechanism: Secondary | ICD-10-CM | POA: Insufficient documentation

## 2015-05-16 DIAGNOSIS — Z8619 Personal history of other infectious and parasitic diseases: Secondary | ICD-10-CM | POA: Insufficient documentation

## 2015-05-16 DIAGNOSIS — O99331 Smoking (tobacco) complicating pregnancy, first trimester: Secondary | ICD-10-CM | POA: Insufficient documentation

## 2015-05-16 DIAGNOSIS — O9989 Other specified diseases and conditions complicating pregnancy, childbirth and the puerperium: Secondary | ICD-10-CM | POA: Insufficient documentation

## 2015-05-16 DIAGNOSIS — Z8739 Personal history of other diseases of the musculoskeletal system and connective tissue: Secondary | ICD-10-CM | POA: Insufficient documentation

## 2015-05-16 DIAGNOSIS — O219 Vomiting of pregnancy, unspecified: Secondary | ICD-10-CM | POA: Insufficient documentation

## 2015-05-16 DIAGNOSIS — R1013 Epigastric pain: Secondary | ICD-10-CM | POA: Diagnosis not present

## 2015-05-16 DIAGNOSIS — F17210 Nicotine dependence, cigarettes, uncomplicated: Secondary | ICD-10-CM | POA: Insufficient documentation

## 2015-05-16 DIAGNOSIS — G8929 Other chronic pain: Secondary | ICD-10-CM | POA: Diagnosis not present

## 2015-05-16 DIAGNOSIS — Z8701 Personal history of pneumonia (recurrent): Secondary | ICD-10-CM | POA: Diagnosis not present

## 2015-05-16 DIAGNOSIS — Z8659 Personal history of other mental and behavioral disorders: Secondary | ICD-10-CM | POA: Insufficient documentation

## 2015-05-16 DIAGNOSIS — Z3A01 Less than 8 weeks gestation of pregnancy: Secondary | ICD-10-CM | POA: Insufficient documentation

## 2015-05-16 DIAGNOSIS — O99351 Diseases of the nervous system complicating pregnancy, first trimester: Secondary | ICD-10-CM | POA: Diagnosis not present

## 2015-05-16 DIAGNOSIS — O10011 Pre-existing essential hypertension complicating pregnancy, first trimester: Secondary | ICD-10-CM | POA: Insufficient documentation

## 2015-05-16 NOTE — ED Notes (Signed)
abd pain onset yesterday with vomiting.  Pt states she is approx 6-[redacted] weeks pregnant

## 2015-05-16 NOTE — ED Notes (Signed)
poc cbg 119

## 2015-05-17 ENCOUNTER — Emergency Department (HOSPITAL_COMMUNITY)
Admission: EM | Admit: 2015-05-17 | Discharge: 2015-05-17 | Disposition: A | Discharge status: Home / Self Care | Payer: Medicaid Other | Attending: Emergency Medicine | Admitting: Emergency Medicine

## 2015-05-17 DIAGNOSIS — Z3490 Encounter for supervision of normal pregnancy, unspecified, unspecified trimester: Secondary | ICD-10-CM

## 2015-05-17 DIAGNOSIS — R109 Unspecified abdominal pain: Secondary | ICD-10-CM

## 2015-05-17 LAB — COMPREHENSIVE METABOLIC PANEL
ALBUMIN: 4.5 g/dL (ref 3.5–5.0)
ALT: 25 U/L (ref 14–54)
AST: 20 U/L (ref 15–41)
Alkaline Phosphatase: 86 U/L (ref 38–126)
Anion gap: 11 (ref 5–15)
BUN: 8 mg/dL (ref 6–20)
CHLORIDE: 104 mmol/L (ref 101–111)
CO2: 21 mmol/L — AB (ref 22–32)
Calcium: 9.6 mg/dL (ref 8.9–10.3)
Creatinine, Ser: 0.55 mg/dL (ref 0.44–1.00)
GFR calc Af Amer: >60 mL/min (ref >60)
Glucose, Bld: 118 mg/dL — ABNORMAL HIGH (ref 65–99)
POTASSIUM: 3.7 mmol/L (ref 3.5–5.1)
SODIUM: 136 mmol/L (ref 135–145)
Total Bilirubin: 0.5 mg/dL (ref 0.3–1.2)
Total Protein: 7.7 g/dL (ref 6.5–8.1)

## 2015-05-17 LAB — CBC WITH DIFFERENTIAL/PLATELET
BASOS ABS: 0 10*3/uL (ref 0.0–0.1)
BASOS PCT: 0 %
Eosinophils Absolute: 0.2 10*3/uL (ref 0.0–0.7)
Eosinophils Relative: 2 %
HEMATOCRIT: 37.9 % (ref 36.0–46.0)
Hemoglobin: 13.1 g/dL (ref 12.0–15.0)
LYMPHS ABS: 3.5 10*3/uL (ref 0.7–4.0)
LYMPHS PCT: 29 %
MCH: 29.8 pg (ref 26.0–34.0)
MCHC: 34.6 g/dL (ref 30.0–36.0)
MCV: 86.1 fL (ref 78.0–100.0)
MONO ABS: 1.1 10*3/uL — AB (ref 0.1–1.0)
Monocytes Relative: 10 %
NEUTROS ABS: 7.1 10*3/uL (ref 1.7–7.7)
Neutrophils Relative %: 59 %
PLATELETS: 354 10*3/uL (ref 150–400)
RBC: 4.4 MIL/uL (ref 3.87–5.11)
RDW: 14.5 % (ref 11.5–15.5)
WBC: 12 10*3/uL — ABNORMAL HIGH (ref 4.0–10.5)

## 2015-05-17 LAB — URINALYSIS, ROUTINE W REFLEX MICROSCOPIC
Bilirubin Urine: NEGATIVE
Glucose, UA: 100 mg/dL — AB
Hgb urine dipstick: NEGATIVE
KETONES UR: NEGATIVE mg/dL
LEUKOCYTES UA: NEGATIVE
NITRITE: NEGATIVE
PROTEIN: NEGATIVE mg/dL
Specific Gravity, Urine: 1.015 (ref 1.005–1.030)
pH: 6 (ref 5.0–8.0)

## 2015-05-17 LAB — HCG, QUANTITATIVE, PREGNANCY: HCG, BETA CHAIN, QUANT, S: 44095 m[IU]/mL — AB (ref <5)

## 2015-05-17 LAB — LIPASE, BLOOD: LIPASE: 24 U/L (ref 11–51)

## 2015-05-17 MED ORDER — HYDROMORPHONE HCL 1 MG/ML IJ SOLN
1.0000 mg | Freq: Once | INTRAMUSCULAR | Status: AC
  Administered 2015-05-17: 1 mg via INTRAVENOUS
  Filled 2015-05-17: qty 1

## 2015-05-17 MED ORDER — ONDANSETRON HCL 4 MG/2ML IJ SOLN
4.0000 mg | Freq: Once | INTRAMUSCULAR | Status: AC
  Administered 2015-05-17: 4 mg via INTRAVENOUS
  Filled 2015-05-17: qty 2

## 2015-05-17 MED ORDER — SODIUM CHLORIDE 0.9 % IV SOLN
INTRAVENOUS | Status: DC
  Administered 2015-05-17: 02:00:00 via INTRAVENOUS

## 2015-05-17 MED ORDER — PROMETHAZINE HCL 25 MG PO TABS: 25.0000 mg | ORAL_TABLET | Freq: Four times a day (QID) | ORAL | Status: DC | PRN

## 2015-05-17 MED ORDER — SODIUM CHLORIDE 0.9 % IV BOLUS (SEPSIS)
1000.0000 mL | Freq: Once | INTRAVENOUS | Status: AC
  Administered 2015-05-17: 1000 mL via INTRAVENOUS

## 2015-05-17 NOTE — Discharge Instructions (Signed)
Abdominal Pain, Adult Many things can cause abdominal pain. Usually, abdominal pain is not caused by a disease and will improve without treatment. It can often be observed and treated at home. Your health care provider will do a physical exam and possibly order blood tests and X-rays to help determine the seriousness of your pain. However, in many cases, more time must pass before a clear cause of the pain can be found. Before that point, your health care provider may not know if you need more testing or further treatment. HOME CARE INSTRUCTIONS Monitor your abdominal pain for any changes. The following actions may help to alleviate any discomfort you are experiencing:  Only take over-the-counter or prescription medicines as directed by your health care provider.  Do not take laxatives unless directed to do so by your health care provider.  Try a clear liquid diet (broth, tea, or water) as directed by your health care provider. Slowly move to a bland diet as tolerated. SEEK MEDICAL CARE IF:  You have unexplained abdominal pain.  You have abdominal pain associated with nausea or diarrhea.  You have pain when you urinate or have a bowel movement.  You experience abdominal pain that wakes you in the night.  You have abdominal pain that is worsened or improved by eating food.  You have abdominal pain that is worsened with eating fatty foods.  You have a fever. SEEK IMMEDIATE MEDICAL CARE IF:  Your pain does not go away within 2 hours.  You keep throwing up (vomiting).  Your pain is felt only in portions of the abdomen, such as the right side or the left lower portion of the abdomen.  You pass bloody or black tarry stools. MAKE SURE YOU:  Understand these instructions.  Will watch your condition.  Will get help right away if you are not doing well or get worse.   This information is not intended to replace advice given to you by your health care provider. Make sure you discuss  any questions you have with your health care provider.   Document Released: 01/13/2005 Document Revised: 12/25/2014 Document Reviewed: 12/13/2012 Elsevier Interactive Patient Education 2016 [employer].  First Trimester of Pregnancy The first trimester of pregnancy is from week 1 until the end of week 12 (months 1 through 3). During this time, your baby will begin to develop inside you. At 6-8 weeks, the eyes and face are formed, and the heartbeat can be seen on ultrasound. At the end of 12 weeks, all the baby's organs are formed. Prenatal care is all the medical care you receive before the birth of your baby. Make sure you get good prenatal care and follow all of your doctor's instructions. HOME CARE  Medicines  Take medicine only as told by your doctor. Some medicines are safe and some are not during pregnancy.  Take your prenatal vitamins as told by your doctor.  Take medicine that helps you poop (stool softener) as needed if your doctor says it is okay. Diet  Eat regular, healthy meals.  Your doctor will tell you the amount of weight gain that is right for you.  Avoid raw meat and uncooked cheese.  If you feel sick to your stomach (nauseous) or throw up (vomit):  Eat 4 or 5 small meals a day instead of 3 large meals.  Try eating a few soda crackers.  Drink liquids between meals instead of during meals.  If you have a hard time pooping (constipation):  Eat high-fiber foods  like fresh vegetables, fruit, and whole grains.  Drink enough fluids to keep your pee (urine) clear or pale yellow. Activity and Exercise  Exercise only as told by your doctor. Stop exercising if you have cramps or pain in your lower belly (abdomen) or low back.  Try to avoid standing for long periods of time. Move your legs often if you must stand in one place for a long time.  Avoid heavy lifting.  Wear low-heeled shoes. Sit and stand up straight.  You can have sex unless your doctor tells you  not to. Relief of Pain or Discomfort  Wear a good support bra if your breasts are sore.  Take warm water baths (sitz baths) to soothe pain or discomfort caused by hemorrhoids. Use hemorrhoid cream if your doctor says it is okay.  Rest with your legs raised if you have leg cramps or low back pain.  Wear support hose if you have puffy, bulging veins (varicose veins) in your legs. Raise (elevate) your feet for 15 minutes, 3-4 times a day. Limit salt in your diet. Prenatal Care  Schedule your prenatal visits by the twelfth week of pregnancy.  Write down your questions. Take them to your prenatal visits.  Keep all your prenatal visits as told by your doctor. Safety  Wear your seat belt at all times when driving.  Make a list of emergency phone numbers. The list should include numbers for family, friends, the hospital, and police and fire departments. General Tips  Ask your doctor for a referral to a local prenatal class. Begin classes no later than at the start of month 6 of your pregnancy.  Ask for help if you need counseling or help with nutrition. Your doctor can give you advice or tell you where to go for help.  Do not use hot tubs, steam rooms, or saunas.  Do not douche or use tampons or scented sanitary pads.  Do not cross your legs for long periods of time.  Avoid litter boxes and soil used by cats.  Avoid all smoking, herbs, and alcohol. Avoid drugs not approved by your doctor.  Do not use any tobacco products, including cigarettes, chewing tobacco, and electronic cigarettes. If you need help quitting, ask your doctor. You may get counseling or other support to help you quit.  Visit your dentist. At home, brush your teeth with a soft toothbrush. Be gentle when you floss. GET HELP IF:  You are dizzy.  You have mild cramps or pressure in your lower belly.  You have a nagging pain in your belly area.  You continue to feel sick to your stomach, throw up, or have watery  poop (diarrhea).  You have a bad smelling fluid coming from your vagina.  You have pain with peeing (urination).  You have increased puffiness (swelling) in your face, hands, legs, or ankles. GET HELP RIGHT AWAY IF:   You have a fever.  You are leaking fluid from your vagina.  You have spotting or bleeding from your vagina.  You have very bad belly cramping or pain.  You gain or lose weight rapidly.  You throw up blood. It may look like coffee grounds.  You are around people who have [place] measles, fifth disease, or chickenpox.  You have a very bad headache.  You have shortness of breath.  You have any kind of trauma, such as from a fall or a car accident.   This information is not intended to replace advice given to you  by your health care provider. Make sure you discuss any questions you have with your health care provider.   Document Released: 09/22/2007 Document Revised: 04/26/2014 Document Reviewed: 02/13/2013 Elsevier Interactive Patient Education [employer]
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
## 2015-05-18 LAB — CBG MONITORING, ED: Glucose-Capillary: 119 mg/dL — ABNORMAL HIGH (ref 65–99)

## 2015-05-18 LAB — HIV ANTIBODY (ROUTINE TESTING W REFLEX): HIV Screen 4th Generation wRfx: NONREACTIVE

## 2015-05-18 LAB — RPR: RPR Ser Ql: NONREACTIVE

## 2015-05-19 ENCOUNTER — Ambulatory Visit (INDEPENDENT_AMBULATORY_CARE_PROVIDER_SITE_OTHER): Payer: Medicaid Other | Admitting: Women's Health

## 2015-05-19 ENCOUNTER — Other Ambulatory Visit (HOSPITAL_COMMUNITY)
Admission: RE | Admit: 2015-05-19 | Discharge: 2015-05-19 | Disposition: A | Discharge status: Home / Self Care | Payer: Medicaid Other | Source: Ambulatory Visit | Attending: Obstetrics & Gynecology | Admitting: Obstetrics & Gynecology

## 2015-05-19 ENCOUNTER — Encounter: Payer: Self-pay | Admitting: Women's Health

## 2015-05-19 VITALS — BP 102/60 | HR 96 | Wt 121.0 lb

## 2015-05-19 DIAGNOSIS — Z3491 Encounter for supervision of normal pregnancy, unspecified, first trimester: Secondary | ICD-10-CM | POA: Diagnosis not present

## 2015-05-19 DIAGNOSIS — Z01411 Encounter for gynecological examination (general) (routine) with abnormal findings: Secondary | ICD-10-CM | POA: Insufficient documentation

## 2015-05-19 DIAGNOSIS — Z0283 Encounter for blood-alcohol and blood-drug test: Secondary | ICD-10-CM

## 2015-05-19 DIAGNOSIS — Z331 Pregnant state, incidental: Secondary | ICD-10-CM

## 2015-05-19 DIAGNOSIS — O09211 Supervision of pregnancy with history of pre-term labor, first trimester: Secondary | ICD-10-CM | POA: Diagnosis not present

## 2015-05-19 DIAGNOSIS — A499 Bacterial infection, unspecified: Secondary | ICD-10-CM | POA: Diagnosis not present

## 2015-05-19 DIAGNOSIS — O0991 Supervision of high risk pregnancy, unspecified, first trimester: Secondary | ICD-10-CM

## 2015-05-19 DIAGNOSIS — B9689 Other specified bacterial agents as the cause of diseases classified elsewhere: Secondary | ICD-10-CM

## 2015-05-19 DIAGNOSIS — O09899 Supervision of other high risk pregnancies, unspecified trimester: Secondary | ICD-10-CM | POA: Insufficient documentation

## 2015-05-19 DIAGNOSIS — O99331 Smoking (tobacco) complicating pregnancy, first trimester: Secondary | ICD-10-CM

## 2015-05-19 DIAGNOSIS — O09291 Supervision of pregnancy with other poor reproductive or obstetric history, first trimester: Secondary | ICD-10-CM | POA: Diagnosis not present

## 2015-05-19 DIAGNOSIS — O09891 Supervision of other high risk pregnancies, first trimester: Secondary | ICD-10-CM

## 2015-05-19 DIAGNOSIS — N760 Acute vaginitis: Secondary | ICD-10-CM

## 2015-05-19 DIAGNOSIS — Z131 Encounter for screening for diabetes mellitus: Secondary | ICD-10-CM | POA: Diagnosis not present

## 2015-05-19 DIAGNOSIS — Z3A01 Less than 8 weeks gestation of pregnancy: Secondary | ICD-10-CM

## 2015-05-19 DIAGNOSIS — Z124 Encounter for screening for malignant neoplasm of cervix: Secondary | ICD-10-CM

## 2015-05-19 DIAGNOSIS — O99341 Other mental disorders complicating pregnancy, first trimester: Secondary | ICD-10-CM | POA: Diagnosis not present

## 2015-05-19 DIAGNOSIS — Z369 Encounter for antenatal screening, unspecified: Secondary | ICD-10-CM

## 2015-05-19 DIAGNOSIS — R81 Glycosuria: Secondary | ICD-10-CM

## 2015-05-19 DIAGNOSIS — Z1151 Encounter for screening for human papillomavirus (HPV): Secondary | ICD-10-CM | POA: Insufficient documentation

## 2015-05-19 DIAGNOSIS — Z1389 Encounter for screening for other disorder: Secondary | ICD-10-CM

## 2015-05-19 DIAGNOSIS — F418 Other specified anxiety disorders: Secondary | ICD-10-CM

## 2015-05-19 DIAGNOSIS — K861 Other chronic pancreatitis: Secondary | ICD-10-CM

## 2015-05-19 LAB — POCT URINALYSIS DIPSTICK
Ketones, UA: NEGATIVE
Leukocytes, UA: NEGATIVE
Nitrite, UA: NEGATIVE
PROTEIN UA: NEGATIVE
RBC UA: NEGATIVE

## 2015-05-19 LAB — GC/CHLAMYDIA PROBE AMP (~~LOC~~) NOT AT ARMC
CHLAMYDIA, DNA PROBE: NEGATIVE
Neisseria Gonorrhea: NEGATIVE

## 2015-05-19 LAB — GLUCOSE, POCT (MANUAL RESULT ENTRY): POC GLUCOSE: 131 mg/dL — AB (ref 70–99)

## 2015-05-19 MED ORDER — SERTRALINE HCL 50 MG PO TABS: 50.0000 mg | ORAL_TABLET | Freq: Every day | ORAL | Status: DC
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
## 2015-05-19 NOTE — Patient Instructions (Addendum)
. Now Pancreatin Digestive Support 10X-200mg  1 with each meal (from [employer])  . Now Super Enzymes 1 with heavier meals with more fat/proteins (from [employer])    Nausea & Vomiting  Have saltine crackers or pretzels by your bed and eat a few bites before you raise your head out of bed in the morning  Eat small frequent meals throughout the day instead of large meals  Drink plenty of fluids throughout the day to stay hydrated, just don't drink a lot of fluids with your meals.  This can make your stomach fill up faster making you feel sick  Do not brush your teeth right after you eat  Products with real ginger are good for nausea, like ginger ale and ginger hard candy Make sure it says made with real ginger!  Sucking on sour candy like lemon heads is also good for nausea  If your prenatal vitamins make you nauseated, take them at night so you will sleep through the nausea  Sea Bands  If you feel like you need medicine for the nausea & vomiting please let us know  If you are unable to keep any fluids or food down please let us know   Constipation  Drink plenty of fluid, preferably water, throughout the day  Eat foods high in fiber such as fruits, vegetables, and grains  Exercise, such as walking, is a good way to keep your bowels regular  Drink warm fluids, especially warm prune juice, or decaf coffee  Eat a 1/2 cup of real oatmeal (not instant), 1/2 cup applesauce, and 1/2-1 cup warm prune juice every day  If needed, you may take Colace (docusate sodium) stool softener once or twice a day to help keep the stool soft. If you are pregnant, wait until you are out of your first trimester (12-14 weeks of pregnancy)  If you still are having problems with constipation, you may take Miralax once daily as needed to help keep your bowels regular.  If you are pregnant, wait until you are out of your first trimester (12-14 weeks of pregnancy)   First Trimester of Pregnancy The first  trimester of pregnancy is from week 1 until the end of week 12 (months 1 through 3). A week after a sperm fertilizes an egg, the egg will implant on the wall of the uterus. This embryo will begin to develop into a baby. Genes from you and your partner are forming the baby. The female genes determine whether the baby is a boy or a girl. At 6-8 weeks, the eyes and face are formed, and the heartbeat can be seen on ultrasound. At the end of 12 weeks, all the baby's organs are formed.  Now that you are pregnant, you will want to do everything you can to have a healthy baby. Two of the most important things are to get good prenatal care and to follow your health care provider's instructions. Prenatal care is all the medical care you receive before the baby's birth. This care will help prevent, find, and treat any problems during the pregnancy and childbirth. BODY CHANGES Your body goes through many changes during pregnancy. The changes vary from woman to woman.   You may gain or lose a couple of pounds at first.  You may feel sick to your stomach (nauseous) and throw up (vomit). If the vomiting is uncontrollable, call your health care provider.  You may tire easily.  You may develop headaches that can be relieved by medicines approved by your  health care provider.  You may urinate more often. Painful urination may mean you have a bladder infection.  You may develop heartburn as a result of your pregnancy.  You may develop constipation because certain hormones are causing the muscles that push waste through your intestines to slow down.  You may develop hemorrhoids or swollen, bulging veins (varicose veins).  Your breasts may begin to grow larger and become tender. Your nipples may stick out more, and the tissue that surrounds them (areola) may become darker.  Your gums may bleed and may be sensitive to brushing and flossing.  Dark spots or blotches (chloasma, mask of pregnancy) may develop on your  face. This will likely fade after the baby is born.  Your menstrual periods will stop.  You may have a loss of appetite.  You may develop cravings for certain kinds of food.  You may have changes in your emotions from day to day, such as being excited to be pregnant or being concerned that something may go wrong with the pregnancy and baby.  You may have more vivid and strange dreams.  You may have changes in your hair. These can include thickening of your hair, rapid growth, and changes in texture. Some women also have hair loss during or after pregnancy, or hair that feels dry or thin. Your hair will most likely return to normal after your baby is born. WHAT TO EXPECT AT YOUR PRENATAL VISITS During a routine prenatal visit:  You will be weighed to make sure you and the baby are growing normally.  Your blood pressure will be taken.  Your abdomen will be measured to track your baby's growth.  The fetal heartbeat will be listened to starting around week 10 or 12 of your pregnancy.  Test results from any previous visits will be discussed. Your health care provider may ask you:  How you are feeling.  If you are feeling the baby move.  If you have had any abnormal symptoms, such as leaking fluid, bleeding, severe headaches, or abdominal cramping.  If you have any questions. Other tests that may be performed during your first trimester include:  Blood tests to find your blood type and to check for the presence of any previous infections. They will also be used to check for low iron levels (anemia) and Rh antibodies. Later in the pregnancy, blood tests for diabetes will be done along with other tests if problems develop.  Urine tests to check for infections, diabetes, or protein in the urine.  An ultrasound to confirm the proper growth and development of the baby.  An amniocentesis to check for possible genetic problems.  Fetal screens for spina bifida and Down syndrome.  You  may need other tests to make sure you and the baby are doing well. HOME CARE INSTRUCTIONS  Medicines  Follow your health care provider's instructions regarding medicine use. Specific medicines may be either safe or unsafe to take during pregnancy.  Take your prenatal vitamins as directed.  If you develop constipation, try taking a stool softener if your health care provider approves. Diet  Eat regular, well-balanced meals. Choose a variety of foods, such as meat or vegetable-based protein, fish, milk and low-fat dairy products, vegetables, fruits, and whole grain breads and cereals. Your health care provider will help you determine the amount of weight gain that is right for you.  Avoid raw meat and uncooked cheese. These carry germs that can cause birth defects in the baby.  Eating four  or five small meals rather than three large meals a day may help relieve nausea and vomiting. If you start to feel nauseous, eating a few soda crackers can be helpful. Drinking liquids between meals instead of during meals also seems to help nausea and vomiting.  If you develop constipation, eat more high-fiber foods, such as fresh vegetables or fruit and whole grains. Drink enough fluids to keep your urine clear or pale yellow. Activity and Exercise  Exercise only as directed by your health care provider. Exercising will help you:  Control your weight.  Stay in shape.  Be prepared for labor and delivery.  Experiencing pain or cramping in the lower abdomen or low back is a good sign that you should stop exercising. Check with your health care provider before continuing normal exercises.  Try to avoid standing for long periods of time. Move your legs often if you must stand in one place for a long time.  Avoid heavy lifting.  Wear low-heeled shoes, and practice good posture.  You may continue to have sex unless your health care provider directs you otherwise. Relief of Pain or Discomfort  Wear a  good support bra for breast tenderness.   Take warm sitz baths to soothe any pain or discomfort caused by hemorrhoids. Use hemorrhoid cream if your health care provider approves.   Rest with your legs elevated if you have leg cramps or low back pain.  If you develop varicose veins in your legs, wear support hose. Elevate your feet for 15 minutes, 3-4 times a day. Limit salt in your diet. Prenatal Care  Schedule your prenatal visits by the twelfth week of pregnancy. They are usually scheduled monthly at first, then more often in the last 2 months before delivery.  Write down your questions. Take them to your prenatal visits.  Keep all your prenatal visits as directed by your health care provider. Safety  Wear your seat belt at all times when driving.  Make a list of emergency phone numbers, including numbers for family, friends, the hospital, and police and fire departments. General Tips  Ask your health care provider for a referral to a local prenatal education class. Begin classes no later than at the beginning of month 6 of your pregnancy.  Ask for help if you have counseling or nutritional needs during pregnancy. Your health care provider can offer advice or refer you to specialists for help with various needs.  Do not use hot tubs, steam rooms, or saunas.  Do not douche or use tampons or scented sanitary pads.  Do not cross your legs for long periods of time.  Avoid cat litter boxes and soil used by cats. These carry germs that can cause birth defects in the baby and possibly loss of the fetus by miscarriage or stillbirth.  Avoid all smoking, herbs, alcohol, and medicines not prescribed by your health care provider. Chemicals in these affect the formation and growth of the baby.  Schedule a dentist appointment. At home, brush your teeth with a soft toothbrush and be gentle when you floss. SEEK MEDICAL CARE IF:   You have dizziness.  You have mild pelvic cramps, pelvic  pressure, or nagging pain in the abdominal area.  You have persistent nausea, vomiting, or diarrhea.  You have a bad smelling vaginal discharge.  You have pain with urination.  You notice increased swelling in your face, hands, legs, or ankles. SEEK IMMEDIATE MEDICAL CARE IF:   You have a fever.  You are  leaking fluid from your vagina.  You have spotting or bleeding from your vagina.  You have severe abdominal cramping or pain.  You have rapid weight gain or loss.  You vomit blood or material that looks like coffee grounds.  You are exposed to [place] measles and have never had them.  You are exposed to fifth disease or chickenpox.  You develop a severe headache.  You have shortness of breath.  You have any kind of trauma, such as from a fall or a car accident. Document Released: 03/30/2001 Document Revised: 08/20/2013 Document Reviewed: 02/13/2013 [facility] Patient Information 2015 [place], [place]. This information is not intended to replace advice given to you by your health care provider. Make sure you discuss any questions you have with your health care provider.

## 2015-05-20 LAB — PMP SCREEN PROFILE (10S), URINE
AMPHETAMINE SCRN UR: NEGATIVE ng/mL
BARBITURATE SCRN UR: NEGATIVE ng/mL
Benzodiazepine Screen, Urine: NEGATIVE ng/mL
CREATININE(CRT), U: 162 mg/dL (ref 20.0–300.0)
Cannabinoids Ur Ql Scn: POSITIVE ng/mL
Cocaine(Metab.)Screen, Urine: NEGATIVE ng/mL
METHADONE SCREEN, URINE: NEGATIVE ng/mL
OPIATE SCRN UR: NEGATIVE ng/mL
Oxycodone+Oxymorphone Ur Ql Scn: NEGATIVE ng/mL
PCP SCRN UR: NEGATIVE ng/mL
PROPOXYPHENE SCREEN: NEGATIVE ng/mL
Ph of Urine: 5.8 (ref 4.5–8.9)

## 2015-05-20 LAB — URINE CULTURE

## 2015-05-20 LAB — ABO/RH: Rh Factor: NEGATIVE

## 2015-05-20 LAB — ANTIBODY SCREEN: ANTIBODY SCREEN: NEGATIVE

## 2015-05-20 LAB — HEPATITIS B SURFACE ANTIGEN: Hepatitis B Surface Ag: NEGATIVE

## 2015-05-20 LAB — VARICELLA ZOSTER ANTIBODY, IGG: Varicella zoster IgG: 752 index (ref >165)

## 2015-05-20 LAB — RUBELLA SCREEN: Rubella Antibodies, IGG: 2.09 index (ref >0.99)

## 2015-05-21 LAB — CYTOLOGY - PAP

## 2015-05-24 ENCOUNTER — Encounter: Payer: Self-pay | Admitting: Women's Health

## 2015-05-24 DIAGNOSIS — R87810 Cervical high risk human papillomavirus (HPV) DNA test positive: Secondary | ICD-10-CM

## 2015-05-24 DIAGNOSIS — R87610 Atypical squamous cells of undetermined significance on cytologic smear of cervix (ASC-US): Secondary | ICD-10-CM | POA: Insufficient documentation

## 2015-05-26 ENCOUNTER — Telehealth: Payer: Self-pay | Admitting: Women's Health

## 2015-05-26 ENCOUNTER — Ambulatory Visit (HOSPITAL_COMMUNITY)
Admission: RE | Admit: 2015-05-26 | Discharge: 2015-05-26 | Disposition: A | Discharge status: Home / Self Care | Payer: Medicaid Other | Source: Ambulatory Visit | Attending: Obstetrics and Gynecology | Admitting: Obstetrics and Gynecology

## 2015-05-26 NOTE — Telephone Encounter (Signed)
Notified pt of ascus/hrhpv pap, needs colpo >14wks.  Cheral Marker, CNM, [facility] 05/26/2015 2:51 PM

## 2015-05-26 NOTE — Telephone Encounter (Signed)
Calling to notify pt of abnormal pap, will need colpo >14wks. Left message to return call.  Cheral Marker, CNM, [facility] 05/26/2015 1:33 PM

## 2015-05-31 ENCOUNTER — Observation Stay (HOSPITAL_COMMUNITY)
Admission: EM | Admit: 2015-05-31 | Discharge: 2015-06-01 | Disposition: A | Discharge status: Home / Self Care | Payer: Medicaid Other | Attending: Emergency Medicine | Admitting: Emergency Medicine

## 2015-05-31 ENCOUNTER — Encounter (HOSPITAL_COMMUNITY): Payer: Self-pay

## 2015-05-31 DIAGNOSIS — O99351 Diseases of the nervous system complicating pregnancy, first trimester: Secondary | ICD-10-CM | POA: Diagnosis not present

## 2015-05-31 DIAGNOSIS — O9989 Other specified diseases and conditions complicating pregnancy, childbirth and the puerperium: Secondary | ICD-10-CM | POA: Diagnosis not present

## 2015-05-31 DIAGNOSIS — Z3A08 8 weeks gestation of pregnancy: Secondary | ICD-10-CM | POA: Insufficient documentation

## 2015-05-31 DIAGNOSIS — O219 Vomiting of pregnancy, unspecified: Secondary | ICD-10-CM | POA: Diagnosis not present

## 2015-05-31 DIAGNOSIS — O99011 Anemia complicating pregnancy, first trimester: Secondary | ICD-10-CM | POA: Diagnosis not present

## 2015-05-31 DIAGNOSIS — Z8619 Personal history of other infectious and parasitic diseases: Secondary | ICD-10-CM | POA: Insufficient documentation

## 2015-05-31 DIAGNOSIS — R1032 Left lower quadrant pain: Secondary | ICD-10-CM | POA: Diagnosis not present

## 2015-05-31 DIAGNOSIS — O99331 Smoking (tobacco) complicating pregnancy, first trimester: Secondary | ICD-10-CM | POA: Diagnosis not present

## 2015-05-31 DIAGNOSIS — R000 Tachycardia, unspecified: Secondary | ICD-10-CM | POA: Insufficient documentation

## 2015-05-31 DIAGNOSIS — F329 Major depressive disorder, single episode, unspecified: Secondary | ICD-10-CM | POA: Insufficient documentation

## 2015-05-31 DIAGNOSIS — R1013 Epigastric pain: Secondary | ICD-10-CM | POA: Diagnosis not present

## 2015-05-31 DIAGNOSIS — M549 Dorsalgia, unspecified: Secondary | ICD-10-CM | POA: Diagnosis not present

## 2015-05-31 DIAGNOSIS — F17210 Nicotine dependence, cigarettes, uncomplicated: Secondary | ICD-10-CM | POA: Insufficient documentation

## 2015-05-31 DIAGNOSIS — O99611 Diseases of the digestive system complicating pregnancy, first trimester: Secondary | ICD-10-CM | POA: Insufficient documentation

## 2015-05-31 DIAGNOSIS — Z79899 Other long term (current) drug therapy: Secondary | ICD-10-CM | POA: Diagnosis not present

## 2015-05-31 DIAGNOSIS — G8929 Other chronic pain: Secondary | ICD-10-CM | POA: Diagnosis not present

## 2015-05-31 DIAGNOSIS — O99341 Other mental disorders complicating pregnancy, first trimester: Secondary | ICD-10-CM | POA: Insufficient documentation

## 2015-05-31 DIAGNOSIS — Z8701 Personal history of pneumonia (recurrent): Secondary | ICD-10-CM | POA: Insufficient documentation

## 2015-05-31 DIAGNOSIS — F419 Anxiety disorder, unspecified: Secondary | ICD-10-CM | POA: Insufficient documentation

## 2015-05-31 DIAGNOSIS — R1031 Right lower quadrant pain: Secondary | ICD-10-CM | POA: Insufficient documentation

## 2015-05-31 DIAGNOSIS — K859 Acute pancreatitis without necrosis or infection, unspecified: Secondary | ICD-10-CM | POA: Insufficient documentation

## 2015-05-31 DIAGNOSIS — R109 Unspecified abdominal pain: Secondary | ICD-10-CM

## 2015-05-31 DIAGNOSIS — O210 Mild hyperemesis gravidarum: Secondary | ICD-10-CM

## 2015-05-31 DIAGNOSIS — O26899 Other specified pregnancy related conditions, unspecified trimester: Secondary | ICD-10-CM

## 2015-05-31 LAB — COMPREHENSIVE METABOLIC PANEL
ALBUMIN: 4.2 g/dL (ref 3.5–5.0)
ALT: 23 U/L (ref 14–54)
ANION GAP: 9 (ref 5–15)
AST: 20 U/L (ref 15–41)
Alkaline Phosphatase: 72 U/L (ref 38–126)
BILIRUBIN TOTAL: 0.4 mg/dL (ref 0.3–1.2)
BUN: 7 mg/dL (ref 6–20)
CO2: 23 mmol/L (ref 22–32)
Calcium: 9.2 mg/dL (ref 8.9–10.3)
Chloride: 105 mmol/L (ref 101–111)
Creatinine, Ser: 0.43 mg/dL — ABNORMAL LOW (ref 0.44–1.00)
GFR calc non Af Amer: >60 mL/min (ref >60)
GLUCOSE: 84 mg/dL (ref 65–99)
POTASSIUM: 3.6 mmol/L (ref 3.5–5.1)
Sodium: 137 mmol/L (ref 135–145)
TOTAL PROTEIN: 7.4 g/dL (ref 6.5–8.1)

## 2015-05-31 LAB — CBC WITH DIFFERENTIAL/PLATELET
Basophils Absolute: 0 10*3/uL (ref 0.0–0.1)
Basophils Relative: 0 %
Eosinophils Absolute: 0.4 10*3/uL (ref 0.0–0.7)
Eosinophils Relative: 3 %
HEMATOCRIT: 36.8 % (ref 36.0–46.0)
HEMOGLOBIN: 12.6 g/dL (ref 12.0–15.0)
LYMPHS ABS: 3.1 10*3/uL (ref 0.7–4.0)
Lymphocytes Relative: 27 %
MCH: 30.1 pg (ref 26.0–34.0)
MCHC: 34.2 g/dL (ref 30.0–36.0)
MCV: 87.8 fL (ref 78.0–100.0)
MONOS PCT: 8 %
Monocytes Absolute: 1 10*3/uL (ref 0.1–1.0)
NEUTROS ABS: 7 10*3/uL (ref 1.7–7.7)
NEUTROS PCT: 62 %
Platelets: 366 10*3/uL (ref 150–400)
RBC: 4.19 MIL/uL (ref 3.87–5.11)
RDW: 14.5 % (ref 11.5–15.5)
WBC: 11.5 10*3/uL — ABNORMAL HIGH (ref 4.0–10.5)

## 2015-05-31 MED ORDER — PROMETHAZINE HCL 25 MG/ML IJ SOLN
12.5000 mg | Freq: Once | INTRAMUSCULAR | Status: AC
  Administered 2015-05-31: 12.5 mg via INTRAVENOUS
  Filled 2015-05-31: qty 1

## 2015-05-31 MED ORDER — HYDROMORPHONE HCL 1 MG/ML IJ SOLN
1.0000 mg | Freq: Once | INTRAMUSCULAR | Status: AC
  Administered 2015-05-31: 1 mg via INTRAVENOUS
  Filled 2015-05-31: qty 1

## 2015-05-31 MED ORDER — FAMOTIDINE IN NACL 20-0.9 MG/50ML-% IV SOLN
20.0000 mg | Freq: Once | INTRAVENOUS | Status: AC
  Administered 2015-05-31: 20 mg via INTRAVENOUS
  Filled 2015-05-31: qty 50

## 2015-05-31 MED ORDER — SODIUM CHLORIDE 0.9 % IV SOLN
INTRAVENOUS | Status: AC
  Administered 2015-05-31: 23:00:00 via INTRAVENOUS

## 2015-05-31 NOTE — ED Notes (Signed)
[facility] informed of patients pain.
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
## 2015-05-31 NOTE — ED Notes (Addendum)
Having nausea and vomiting. Pain from my abdomen into my back. Severe pain in my lower abdomen. Pregnant, 8 weeks per pt. They had me on Diclegis, but it was not doing any good. So now I am on Phenergan.

## 2015-06-01 ENCOUNTER — Encounter (HOSPITAL_COMMUNITY): Payer: Self-pay

## 2015-06-01 DIAGNOSIS — R1032 Left lower quadrant pain: Secondary | ICD-10-CM

## 2015-06-01 DIAGNOSIS — O210 Mild hyperemesis gravidarum: Secondary | ICD-10-CM | POA: Diagnosis present

## 2015-06-01 DIAGNOSIS — G8929 Other chronic pain: Secondary | ICD-10-CM | POA: Diagnosis not present

## 2015-06-01 DIAGNOSIS — Z3A09 9 weeks gestation of pregnancy: Secondary | ICD-10-CM | POA: Diagnosis not present

## 2015-06-01 DIAGNOSIS — R1031 Right lower quadrant pain: Secondary | ICD-10-CM

## 2015-06-01 LAB — COMPREHENSIVE METABOLIC PANEL
ALBUMIN: 3.4 g/dL — AB (ref 3.5–5.0)
ALK PHOS: 59 U/L (ref 38–126)
ALT: 21 U/L (ref 14–54)
ANION GAP: 6 (ref 5–15)
AST: 19 U/L (ref 15–41)
BUN: 5 mg/dL — ABNORMAL LOW (ref 6–20)
CALCIUM: 8.4 mg/dL — AB (ref 8.9–10.3)
CO2: 24 mmol/L (ref 22–32)
Chloride: 108 mmol/L (ref 101–111)
Creatinine, Ser: 0.43 mg/dL — ABNORMAL LOW (ref 0.44–1.00)
GFR calc Af Amer: >60 mL/min (ref >60)
GFR calc non Af Amer: >60 mL/min (ref >60)
GLUCOSE: 115 mg/dL — AB (ref 65–99)
POTASSIUM: 3.5 mmol/L (ref 3.5–5.1)
SODIUM: 138 mmol/L (ref 135–145)
Total Bilirubin: 0.3 mg/dL (ref 0.3–1.2)
Total Protein: 6.1 g/dL — ABNORMAL LOW (ref 6.5–8.1)

## 2015-06-01 LAB — URINALYSIS, ROUTINE W REFLEX MICROSCOPIC
BILIRUBIN URINE: NEGATIVE
GLUCOSE, UA: 250 mg/dL — AB
Hgb urine dipstick: NEGATIVE
Ketones, ur: NEGATIVE mg/dL
Leukocytes, UA: NEGATIVE
Nitrite: NEGATIVE
PH: 6.5 (ref 5.0–8.0)
Protein, ur: NEGATIVE mg/dL
SPECIFIC GRAVITY, URINE: 1.02 (ref 1.005–1.030)

## 2015-06-01 LAB — AMYLASE: Amylase: 65 U/L (ref 28–100)

## 2015-06-01 LAB — LIPASE, BLOOD: Lipase: 27 U/L (ref 11–51)

## 2015-06-01 MED ORDER — PROMETHAZINE HCL 25 MG RE SUPP: 25.0000 mg | Freq: Four times a day (QID) | RECTAL | Status: DC | PRN

## 2015-06-01 MED ORDER — OXYCODONE HCL 10 MG PO TABS: 5.0000 mg | ORAL_TABLET | Freq: Four times a day (QID) | ORAL | Status: DC

## 2015-06-01 MED ORDER — PROMETHAZINE HCL 25 MG PO TABS: 25.0000 mg | ORAL_TABLET | Freq: Four times a day (QID) | ORAL | Status: DC | PRN

## 2015-06-01 MED ORDER — HYDROMORPHONE HCL 1 MG/ML IJ SOLN
0.5000 mg | Freq: Once | INTRAMUSCULAR | Status: AC
  Administered 2015-06-01: 0.5 mg via INTRAVENOUS
  Filled 2015-06-01: qty 1

## 2015-06-01 MED ORDER — HYDROMORPHONE HCL 1 MG/ML IJ SOLN
0.5000 mg | INTRAMUSCULAR | Status: DC | PRN
  Administered 2015-06-01: 0.5 mg via INTRAVENOUS
  Filled 2015-06-01: qty 1

## 2015-06-01 MED ORDER — ACETAMINOPHEN 325 MG PO TABS: 650.0000 mg | ORAL_TABLET | ORAL | Status: DC | PRN

## 2015-06-01 MED ORDER — HYDROMORPHONE HCL 1 MG/ML IJ SOLN
0.5000 mg | INTRAMUSCULAR | Status: DC | PRN
  Administered 2015-06-01 (×4): 0.5 mg via INTRAVENOUS
  Filled 2015-06-01 (×4): qty 1

## 2015-06-01 MED ORDER — FAMOTIDINE IN NACL 20-0.9 MG/50ML-% IV SOLN
20.0000 mg | Freq: Two times a day (BID) | INTRAVENOUS | Status: DC
  Administered 2015-06-01: 20 mg via INTRAVENOUS
  Filled 2015-06-01 (×3): qty 50

## 2015-06-01 MED ORDER — DEXTROSE IN LACTATED RINGERS 5 % IV SOLN
INTRAVENOUS | Status: DC
  Administered 2015-06-01: 01:00:00 via INTRAVENOUS
  Administered 2015-06-01: 150 mL/h via INTRAVENOUS

## 2015-06-01 MED ORDER — PROMETHAZINE HCL 25 MG/ML IJ SOLN
12.5000 mg | INTRAMUSCULAR | Status: DC | PRN
  Administered 2015-06-01 (×3): 12.5 mg via INTRAVENOUS
  Filled 2015-06-01 (×3): qty 1
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
## 2015-06-01 NOTE — Progress Notes (Signed)
Subjective: Patient reports nausea, vomiting and no problems voiding.  Patient has tolerated diet, only throughout a portion of the food, Down soft diet, and is having good urine output so dehydration is resolved. The patient reports that during her last pregnancy she would sometimes require oxycodone 10 mg up to every 6 hours. But usually can function on half that dosing. She was out of medicines when she came to the emergency room this visit. She does well with Phenergan orally or rectally. She will be allowed to stay on the Diclegis , and will be given Phenergan both orally and as suppository to use depending on severity of the nausea.  Objective: I have reviewed patient's vital signs, intake and output and labs. Amylase and lipase were normal.  General: alert, cooperative and no distress GI: soft, non-tender; bowel sounds normal; no masses,  no organomegaly Vaginal Bleeding: none   Assessment/Plan: 1. Discharge home 2. Will plan to see patient to 2 weeks during pregnancy for opiate management the maximum dose she would require is 56 tablets per 2 weeks of oxycodone 10 mg since we are adding Phenergan to her antiemetic I'm going to give her 1 week's worth of the oxycodone 10 mg tablets, 28 tablets in addition to the Phenergan and Diclegis and we'll see her within a week.     [name] 06/01/2015, 1:00 PM

## 2015-06-01 NOTE — Progress Notes (Signed)
1345 d/c paperwork, instructions and hard Rxs given to patient, significant other at bedside. IV catheter removed from LEFT hand/wrist region, no s/s of infection noted, patient tolerated well w/no c/o pain or discomfort noted. Patient gathered her belongings and confirmed she had everything she came with & wasn't missing anything at this time. Patient called someone to pick her and her significant other up from the hospital & transport home, they are waiting on their ride home at this time.
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
## 2015-06-05 ENCOUNTER — Encounter: Payer: Self-pay | Admitting: Gastroenterology

## 2015-06-05 ENCOUNTER — Telehealth: Payer: Self-pay | Admitting: Gastroenterology

## 2015-06-05 ENCOUNTER — Ambulatory Visit: Payer: Medicaid Other | Admitting: Gastroenterology

## 2015-06-05 NOTE — Telephone Encounter (Signed)
PATIENT WAS A NO SHOW AND LETTER SENT  °

## 2015-06-05 NOTE — Telephone Encounter (Signed)
2nd no-show this year.  If she no-shows again, we need to discharge from practice.

## 2015-06-05 NOTE — Telephone Encounter (Signed)
Noted  

## 2015-06-06 ENCOUNTER — Encounter: Payer: Medicaid Other | Admitting: Obstetrics and Gynecology

## 2015-06-09 ENCOUNTER — Encounter: Payer: Medicaid Other | Admitting: Obstetrics & Gynecology

## 2015-06-10 ENCOUNTER — Ambulatory Visit (INDEPENDENT_AMBULATORY_CARE_PROVIDER_SITE_OTHER): Payer: Medicaid Other | Admitting: Obstetrics and Gynecology

## 2015-06-10 ENCOUNTER — Encounter: Payer: Self-pay | Admitting: Obstetrics and Gynecology

## 2015-06-10 VITALS — BP 100/62 | HR 104 | Wt 123.0 lb

## 2015-06-10 DIAGNOSIS — Z3A10 10 weeks gestation of pregnancy: Secondary | ICD-10-CM | POA: Diagnosis not present

## 2015-06-10 DIAGNOSIS — O09211 Supervision of pregnancy with history of pre-term labor, first trimester: Secondary | ICD-10-CM | POA: Diagnosis not present

## 2015-06-10 DIAGNOSIS — O99321 Drug use complicating pregnancy, first trimester: Secondary | ICD-10-CM | POA: Diagnosis not present

## 2015-06-10 DIAGNOSIS — Z1389 Encounter for screening for other disorder: Secondary | ICD-10-CM | POA: Diagnosis not present

## 2015-06-10 DIAGNOSIS — K861 Other chronic pancreatitis: Secondary | ICD-10-CM

## 2015-06-10 DIAGNOSIS — Z331 Pregnant state, incidental: Secondary | ICD-10-CM | POA: Diagnosis not present

## 2015-06-10 DIAGNOSIS — F1120 Opioid dependence, uncomplicated: Secondary | ICD-10-CM

## 2015-06-10 DIAGNOSIS — Z3491 Encounter for supervision of normal pregnancy, unspecified, first trimester: Secondary | ICD-10-CM

## 2015-06-10 DIAGNOSIS — O09891 Supervision of other high risk pregnancies, first trimester: Secondary | ICD-10-CM | POA: Diagnosis not present

## 2015-06-10 LAB — POCT URINALYSIS DIPSTICK
Blood, UA: NEGATIVE
GLUCOSE UA: 4
LEUKOCYTES UA: NEGATIVE
Nitrite, UA: NEGATIVE
PROTEIN UA: NEGATIVE

## 2015-06-10 MED ORDER — OXYCODONE HCL 10 MG PO TABS: 10.0000 mg | ORAL_TABLET | Freq: Four times a day (QID) | ORAL | Status: DC

## 2015-06-10 NOTE — Progress Notes (Signed)
High Risk Pregnancy Diagnosis(es):    History of chronic pancreatitis, opiate overuse,   Blood pressure 100/62, pulse 104, weight 123 lb (55.792 kg), last menstrual period 04/04/2015, unknown if currently breastfeeding. HPI: [id number] [redacted]w[redacted]d Estimated Date of Delivery: 01/09/16           BP weight and urine results reviewed and notable for 4= GLUCOSURIA.[name] Patient reports    good fetal movement, denies any bleeding and no rupture of membranes symptoms or regular contractions .  Fundal Height:  9wk Fetal Heart rate:  145 Edema:  minimal Urinalysis: Positive for glucosuria 4+   .Assessment HROB :G3P0201  @ [redacted]w[redacted]d,   Glucosuria 4+,                       Opiate dependence                      Hx chronic pancreatitis. ]                    Nausea and vomiting of early pregnancy controlled on Promethazine   Medication(s) Plans:  Continue current regimen of 10 mg q 6h . Check uds today  Treatment Plan:        Consider referral to pain clinic if opiate use continues  Follow up:           2 weeks for  Med review. All questions were answered.  Due to hx pancreatitis , will check Hgb A1C and early GTT

## 2015-06-10 NOTE — Progress Notes (Signed)
Pt here today for follow up from the hospital and to manage medication.

## 2015-06-10 NOTE — Addendum Note (Signed)
Addended by: Richardson Chiquito on: 06/10/2015 03:49 PM   Modules accepted: Orders

## 2015-06-11 LAB — PMP SCREEN PROFILE (10S), URINE
AMPHETAMINE SCRN UR: NEGATIVE ng/mL
Barbiturate Screen, Ur: NEGATIVE ng/mL
Benzodiazepine Screen, Urine: POSITIVE ng/mL
CANNABINOIDS UR QL SCN: POSITIVE ng/mL
CREATININE(CRT), U: 89 mg/dL (ref 20.0–300.0)
Cocaine(Metab.)Screen, Urine: NEGATIVE ng/mL
METHADONE SCREEN, URINE: NEGATIVE ng/mL
OXYCODONE+OXYMORPHONE UR QL SCN: NEGATIVE ng/mL
Opiate Scrn, Ur: NEGATIVE ng/mL
PCP SCRN UR: NEGATIVE ng/mL
PH UR, DRUG SCRN: 7.6 (ref 4.5–8.9)
PROPOXYPHENE SCREEN: NEGATIVE ng/mL

## 2015-06-16 ENCOUNTER — Encounter: Payer: Medicaid Other | Admitting: Obstetrics & Gynecology

## 2015-06-20 ENCOUNTER — Encounter (HOSPITAL_COMMUNITY): Payer: Self-pay

## 2015-06-20 ENCOUNTER — Emergency Department (HOSPITAL_COMMUNITY)
Admission: EM | Admit: 2015-06-20 | Discharge: 2015-06-20 | Disposition: A | Discharge status: Home / Self Care | Payer: Medicaid Other | Attending: Emergency Medicine | Admitting: Emergency Medicine

## 2015-06-20 DIAGNOSIS — R1013 Epigastric pain: Secondary | ICD-10-CM | POA: Insufficient documentation

## 2015-06-20 DIAGNOSIS — O9989 Other specified diseases and conditions complicating pregnancy, childbirth and the puerperium: Secondary | ICD-10-CM | POA: Insufficient documentation

## 2015-06-20 DIAGNOSIS — O99351 Diseases of the nervous system complicating pregnancy, first trimester: Secondary | ICD-10-CM | POA: Insufficient documentation

## 2015-06-20 DIAGNOSIS — F419 Anxiety disorder, unspecified: Secondary | ICD-10-CM | POA: Insufficient documentation

## 2015-06-20 DIAGNOSIS — Z79899 Other long term (current) drug therapy: Secondary | ICD-10-CM | POA: Diagnosis not present

## 2015-06-20 DIAGNOSIS — O99341 Other mental disorders complicating pregnancy, first trimester: Secondary | ICD-10-CM | POA: Insufficient documentation

## 2015-06-20 DIAGNOSIS — Z8719 Personal history of other diseases of the digestive system: Secondary | ICD-10-CM | POA: Insufficient documentation

## 2015-06-20 DIAGNOSIS — Z8701 Personal history of pneumonia (recurrent): Secondary | ICD-10-CM | POA: Diagnosis not present

## 2015-06-20 DIAGNOSIS — O99011 Anemia complicating pregnancy, first trimester: Secondary | ICD-10-CM | POA: Diagnosis not present

## 2015-06-20 DIAGNOSIS — F329 Major depressive disorder, single episode, unspecified: Secondary | ICD-10-CM | POA: Insufficient documentation

## 2015-06-20 DIAGNOSIS — Z8619 Personal history of other infectious and parasitic diseases: Secondary | ICD-10-CM | POA: Insufficient documentation

## 2015-06-20 DIAGNOSIS — R109 Unspecified abdominal pain: Secondary | ICD-10-CM

## 2015-06-20 DIAGNOSIS — Z3A00 Weeks of gestation of pregnancy not specified: Secondary | ICD-10-CM | POA: Diagnosis not present

## 2015-06-20 DIAGNOSIS — O10011 Pre-existing essential hypertension complicating pregnancy, first trimester: Secondary | ICD-10-CM | POA: Diagnosis not present

## 2015-06-20 DIAGNOSIS — O99331 Smoking (tobacco) complicating pregnancy, first trimester: Secondary | ICD-10-CM | POA: Insufficient documentation

## 2015-06-20 DIAGNOSIS — F17210 Nicotine dependence, cigarettes, uncomplicated: Secondary | ICD-10-CM | POA: Diagnosis not present

## 2015-06-20 DIAGNOSIS — R197 Diarrhea, unspecified: Secondary | ICD-10-CM | POA: Diagnosis not present

## 2015-06-20 DIAGNOSIS — O210 Mild hyperemesis gravidarum: Secondary | ICD-10-CM | POA: Insufficient documentation

## 2015-06-20 DIAGNOSIS — G8929 Other chronic pain: Secondary | ICD-10-CM | POA: Diagnosis not present

## 2015-06-20 DIAGNOSIS — R112 Nausea with vomiting, unspecified: Secondary | ICD-10-CM

## 2015-06-20 LAB — COMPREHENSIVE METABOLIC PANEL
ALK PHOS: 68 U/L (ref 38–126)
ALT: 29 U/L (ref 14–54)
ANION GAP: 8 (ref 5–15)
AST: 27 U/L (ref 15–41)
Albumin: 4.2 g/dL (ref 3.5–5.0)
BUN: 6 mg/dL (ref 6–20)
CALCIUM: 9.5 mg/dL (ref 8.9–10.3)
CO2: 22 mmol/L (ref 22–32)
CREATININE: 0.45 mg/dL (ref 0.44–1.00)
Chloride: 107 mmol/L (ref 101–111)
Glucose, Bld: 112 mg/dL — ABNORMAL HIGH (ref 65–99)
Potassium: 3.9 mmol/L (ref 3.5–5.1)
Sodium: 137 mmol/L (ref 135–145)
TOTAL PROTEIN: 8 g/dL (ref 6.5–8.1)
Total Bilirubin: 0.4 mg/dL (ref 0.3–1.2)

## 2015-06-20 LAB — CBC WITH DIFFERENTIAL/PLATELET
BASOS ABS: 0 10*3/uL (ref 0.0–0.1)
BASOS PCT: 0 %
EOS ABS: 0.2 10*3/uL (ref 0.0–0.7)
EOS PCT: 2 %
HCT: 33.6 % — ABNORMAL LOW (ref 36.0–46.0)
Hemoglobin: 11.6 g/dL — ABNORMAL LOW (ref 12.0–15.0)
Lymphocytes Relative: 20 %
Lymphs Abs: 2.3 10*3/uL (ref 0.7–4.0)
MCH: 30.4 pg (ref 26.0–34.0)
MCHC: 34.5 g/dL (ref 30.0–36.0)
MCV: 88 fL (ref 78.0–100.0)
Monocytes Absolute: 0.9 10*3/uL (ref 0.1–1.0)
Monocytes Relative: 8 %
Neutro Abs: 7.8 10*3/uL — ABNORMAL HIGH (ref 1.7–7.7)
Neutrophils Relative %: 70 %
PLATELETS: 332 10*3/uL (ref 150–400)
RBC: 3.82 MIL/uL — AB (ref 3.87–5.11)
RDW: 14.5 % (ref 11.5–15.5)
WBC: 11.2 10*3/uL — AB (ref 4.0–10.5)

## 2015-06-20 LAB — RAPID URINE DRUG SCREEN, HOSP PERFORMED
AMPHETAMINES: NOT DETECTED
BENZODIAZEPINES: NOT DETECTED
Barbiturates: NOT DETECTED
Cocaine: NOT DETECTED
OPIATES: NOT DETECTED
Tetrahydrocannabinol: NOT DETECTED

## 2015-06-20 LAB — URINALYSIS, ROUTINE W REFLEX MICROSCOPIC
Bilirubin Urine: NEGATIVE
Glucose, UA: 250 mg/dL — AB
Hgb urine dipstick: NEGATIVE
KETONES UR: NEGATIVE mg/dL
LEUKOCYTES UA: NEGATIVE
NITRITE: NEGATIVE
PROTEIN: NEGATIVE mg/dL
Specific Gravity, Urine: 1.01 (ref 1.005–1.030)
pH: 6 (ref 5.0–8.0)

## 2015-06-20 LAB — LIPASE, BLOOD: LIPASE: 33 U/L (ref 11–51)

## 2015-06-20 MED ORDER — OXYCODONE-ACETAMINOPHEN 5-325 MG PO TABS
2.0000 | ORAL_TABLET | Freq: Once | ORAL | Status: AC
  Administered 2015-06-20: 2 via ORAL
  Filled 2015-06-20: qty 2

## 2015-06-20 MED ORDER — PROMETHAZINE HCL 25 MG/ML IJ SOLN
25.0000 mg | Freq: Once | INTRAMUSCULAR | Status: AC
  Administered 2015-06-20: 25 mg via INTRAMUSCULAR
  Filled 2015-06-20: qty 1

## 2015-06-20 MED ORDER — SODIUM CHLORIDE 0.9 % IV BOLUS (SEPSIS)
1000.0000 mL | Freq: Once | INTRAVENOUS | Status: AC
  Administered 2015-06-20: 1000 mL via INTRAVENOUS

## 2015-06-20 NOTE — ED Notes (Signed)
Pain in upper right abd onset tonight, reports as a "pulsating" type pain that is episodic, vomiting.   Pt is approx [redacted] weeks pregnant

## 2015-06-20 NOTE — Discharge Instructions (Signed)
Take your nausea and pain medications that you have prescribed. Follow up with Dr Emelda FearFerguson as directed. Return to the ED if you get lower abdominal pain or vaginal bleeding.
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
## 2015-06-20 NOTE — ED Notes (Signed)
Pt alert & oriented x4, stable gait. Patient given discharge instructions, paperwork & prescription(s). Patient  instructed to stop at the registration desk to finish any additional paperwork. Patient verbalized understanding. Pt left department w/ no further questions. 

## 2015-06-23 ENCOUNTER — Ambulatory Visit (INDEPENDENT_AMBULATORY_CARE_PROVIDER_SITE_OTHER): Payer: Medicaid Other | Admitting: Obstetrics and Gynecology

## 2015-06-23 ENCOUNTER — Encounter: Payer: Self-pay | Admitting: Obstetrics and Gynecology

## 2015-06-23 VITALS — BP 90/60 | HR 106 | Wt 127.5 lb

## 2015-06-23 DIAGNOSIS — O99321 Drug use complicating pregnancy, first trimester: Secondary | ICD-10-CM

## 2015-06-23 DIAGNOSIS — Z3A12 12 weeks gestation of pregnancy: Secondary | ICD-10-CM

## 2015-06-23 DIAGNOSIS — Z331 Pregnant state, incidental: Secondary | ICD-10-CM

## 2015-06-23 DIAGNOSIS — O09211 Supervision of pregnancy with history of pre-term labor, first trimester: Secondary | ICD-10-CM | POA: Diagnosis not present

## 2015-06-23 DIAGNOSIS — O09891 Supervision of other high risk pregnancies, first trimester: Secondary | ICD-10-CM

## 2015-06-23 DIAGNOSIS — Z3491 Encounter for supervision of normal pregnancy, unspecified, first trimester: Secondary | ICD-10-CM

## 2015-06-23 DIAGNOSIS — Z1389 Encounter for screening for other disorder: Secondary | ICD-10-CM

## 2015-06-23 DIAGNOSIS — O99611 Diseases of the digestive system complicating pregnancy, first trimester: Secondary | ICD-10-CM | POA: Diagnosis not present

## 2015-06-23 LAB — POCT URINALYSIS DIPSTICK
Glucose, UA: NEGATIVE
Ketones, UA: NEGATIVE
Leukocytes, UA: NEGATIVE
NITRITE UA: NEGATIVE
PROTEIN UA: NEGATIVE
RBC UA: NEGATIVE

## 2015-06-23 MED ORDER — OXYCODONE HCL 10 MG PO TABS: 10.0000 mg | ORAL_TABLET | Freq: Four times a day (QID) | ORAL | Status: DC

## 2015-06-23 MED ORDER — PROMETHAZINE HCL 25 MG PO TABS: 25.0000 mg | ORAL_TABLET | Freq: Four times a day (QID) | ORAL | Status: DC | PRN

## 2015-06-23 MED ORDER — PROMETHAZINE HCL 25 MG RE SUPP: 25.0000 mg | Freq: Four times a day (QID) | RECTAL | Status: DC | PRN

## 2015-06-23 NOTE — Progress Notes (Signed)
Pt denies any problems or concerns at this time.  

## 2015-06-23 NOTE — Progress Notes (Signed)
Patient ID: Amber BirkKatie C Dunn, female   DOB: 17-May-1985, 30 y.o.   MRN: [id number]  High Risk Pregnancy Diagnosis(es):   History of chronic pancreatitis, opiate overuse,   G3P0201 377w3d Estimated Date of Delivery: 01/09/16   HPI: The patient is being seen today for ongoing management of high risk pregnancy due to history of chronic pancreatitis, opiate overuse,   Today she complains of chronic constipation. Pt notes that suppositories significantly improve her Sx and requests a refill today. Pt also notes that she is taking oxycodone every 6 hours for her chronic abd pain due to chronic pancreatitis.  Patient reports good fetal movement, denies any bleeding and no rupture of membranes symptoms or regular contractions.  BP weight and urine results all reviewed and noted. Blood pressure 90/60, pulse 106, weight 127 lb 8 oz (57.834 kg), last menstrual period 04/04/2015, unknown if currently breastfeeding.  Fetal Surveillance Testing today: Not done Fundal Height:  NA Fetal Heart rate: 163 Edema:  none Urinalysis: Negative  Questions were answered.  Lab and sonogram results have been reviewed. Comments: normal   Assessment:  1.  Pregnancy at 387w3d,  Estimated Date of Delivery: 01/09/16                         2.  Chronic pancreatitis stable.   Medication(s) Plans:  No changes. Continue Oxycodone at 5-10 mg q 6h, Phenergan 25 mg  PR or PO.  Treatment Plan:   1. Follow up in 2 weeks for appointment for high risk OB care, to control opiate Rx size.  By signing my name below, I, Marica Otterusrat Rahman, attest that this documentation has been prepared under the direction and in the presence of Christin BachJohn Miciah Covelli, MD. Electronically Signed: Marica OtterNusrat Rahman, ED Scribe. 06/23/2015. 12:01 PM.   I personally performed the services described in this documentation, which was SCRIBED in my presence. The recorded information has been reviewed and considered accurate. It has been edited as necessary during  review. Tilda BurrowFERGUSON,Tajuana Kniskern V, MD

## 2015-06-25 ENCOUNTER — Encounter (HOSPITAL_COMMUNITY)
Admission: RE | Admit: 2015-06-25 | Discharge: 2015-06-25 | Disposition: A | Discharge status: Home / Self Care | Payer: 59 | Source: Ambulatory Visit | Attending: Obstetrics and Gynecology | Admitting: Obstetrics and Gynecology

## 2015-07-07 ENCOUNTER — Encounter: Payer: Self-pay | Admitting: Obstetrics & Gynecology

## 2015-07-07 ENCOUNTER — Other Ambulatory Visit: Payer: Medicaid Other

## 2015-07-07 ENCOUNTER — Ambulatory Visit (INDEPENDENT_AMBULATORY_CARE_PROVIDER_SITE_OTHER): Payer: Medicaid Other | Admitting: Obstetrics & Gynecology

## 2015-07-07 VITALS — BP 108/70 | HR 92 | Wt 129.3 lb

## 2015-07-07 DIAGNOSIS — O99612 Diseases of the digestive system complicating pregnancy, second trimester: Secondary | ICD-10-CM

## 2015-07-07 DIAGNOSIS — Z1389 Encounter for screening for other disorder: Secondary | ICD-10-CM

## 2015-07-07 DIAGNOSIS — O2692 Pregnancy related conditions, unspecified, second trimester: Secondary | ICD-10-CM

## 2015-07-07 DIAGNOSIS — O0992 Supervision of high risk pregnancy, unspecified, second trimester: Secondary | ICD-10-CM | POA: Diagnosis not present

## 2015-07-07 DIAGNOSIS — Z3682 Encounter for antenatal screening for nuchal translucency: Secondary | ICD-10-CM

## 2015-07-07 DIAGNOSIS — Z3491 Encounter for supervision of normal pregnancy, unspecified, first trimester: Secondary | ICD-10-CM

## 2015-07-07 DIAGNOSIS — Z331 Pregnant state, incidental: Secondary | ICD-10-CM

## 2015-07-07 DIAGNOSIS — O99322 Drug use complicating pregnancy, second trimester: Secondary | ICD-10-CM | POA: Diagnosis not present

## 2015-07-07 DIAGNOSIS — Z3A14 14 weeks gestation of pregnancy: Secondary | ICD-10-CM | POA: Diagnosis not present

## 2015-07-07 LAB — POCT URINALYSIS DIPSTICK
GLUCOSE UA: 3
Leukocytes, UA: NEGATIVE
NITRITE UA: NEGATIVE
RBC UA: NEGATIVE

## 2015-07-07 MED ORDER — OXYCODONE HCL 10 MG PO TABS: 10.0000 mg | ORAL_TABLET | Freq: Four times a day (QID) | ORAL | Status: DC

## 2015-07-07 NOTE — Progress Notes (Signed)
Fetal Surveillance Testing today:  FHR too big to do NT(CRL too large)   High Risk Pregnancy Diagnosis(es):   Opoid dependence, abdominal pain source  G3P0201 158w3d Estimated Date of Delivery: 01/09/16  Blood pressure 108/70, pulse 92, weight 129 lb 4.8 oz (58.65 kg), last menstrual period 04/04/2015, unknown if currently breastfeeding.  Urinalysis: Positive for 3+ gluose   HPI: The patient is being seen today for ongoing management of opioid dependence. Today she reports chronic abdominal pain   BP weight and urine results all reviewed and noted. Patient reports good fetal movement, denies any bleeding and no rupture of membranes symptoms or regular contractions.  Fundal Height:  na Fetal Heart rate:  160 Edema:  none  Patient is without complaints other than noted in her HPI. All questions were answered.  All lab and sonogram results have been reviewed. Comments:    Assessment:  1.  Pregnancy at 288w3d,  Estimated Date of Delivery: 01/09/16 :                          2.  Opioid dependence                        3.    Medication(s) Plans:  Oxycodone 10 mg,  4 per day #28  Treatment Plan:  Hopefully will be ablae to wean once we get out of first trimeter, quad screen next week  No Follow-up on file. for appointment for high risk OB care  Meds ordered this encounter  Medications  . acetaminophen (TYLENOL) 500 MG tablet    Sig: Take 500 mg by mouth every 6 (six) hours as needed.  . Oxycodone HCl 10 MG TABS    Sig: Take 1 tablet (10 mg total) by mouth every 6 (six) hours.    Dispense:  28 tablet    Refill:  0   Orders Placed This Encounter  Procedures  . US Fetal Nuchal Translucency Measurement  . POCT urinalysis dipstick

## 2015-07-14 ENCOUNTER — Encounter: Payer: Self-pay | Admitting: Obstetrics and Gynecology

## 2015-07-14 ENCOUNTER — Ambulatory Visit (INDEPENDENT_AMBULATORY_CARE_PROVIDER_SITE_OTHER): Payer: Medicaid Other | Admitting: Obstetrics and Gynecology

## 2015-07-14 VITALS — BP 100/60 | HR 80 | Wt 133.0 lb

## 2015-07-14 DIAGNOSIS — O99612 Diseases of the digestive system complicating pregnancy, second trimester: Secondary | ICD-10-CM | POA: Diagnosis not present

## 2015-07-14 DIAGNOSIS — Z3492 Encounter for supervision of normal pregnancy, unspecified, second trimester: Secondary | ICD-10-CM

## 2015-07-14 DIAGNOSIS — Z331 Pregnant state, incidental: Secondary | ICD-10-CM | POA: Diagnosis not present

## 2015-07-14 DIAGNOSIS — O0992 Supervision of high risk pregnancy, unspecified, second trimester: Secondary | ICD-10-CM

## 2015-07-14 DIAGNOSIS — Z1389 Encounter for screening for other disorder: Secondary | ICD-10-CM | POA: Diagnosis not present

## 2015-07-14 DIAGNOSIS — O99322 Drug use complicating pregnancy, second trimester: Secondary | ICD-10-CM | POA: Diagnosis not present

## 2015-07-14 DIAGNOSIS — Z3A15 15 weeks gestation of pregnancy: Secondary | ICD-10-CM

## 2015-07-14 LAB — POCT URINALYSIS DIPSTICK
Glucose, UA: NEGATIVE
Ketones, UA: NEGATIVE
Leukocytes, UA: NEGATIVE
NITRITE UA: NEGATIVE
RBC UA: NEGATIVE

## 2015-07-14 MED ORDER — OXYCODONE HCL 10 MG PO TABS: 10.0000 mg | ORAL_TABLET | Freq: Four times a day (QID) | ORAL | Status: DC

## 2015-07-14 NOTE — Progress Notes (Signed)
Pt denies any problems or concerns at this time.  

## 2015-07-14 NOTE — Progress Notes (Signed)
Patient ID: [name], female   DOB: Aug 03, 1985, 30 y.o.   MRN: [id number]  High Risk Pregnancy Diagnosis(es):  Opoid dependence, abdominal pain source  G3P0201 3375w3d Estimated Date of Delivery: 01/09/16    HPI: The patient is being seen today for ongoing management of high risk pregnancy: Opoid dependence, abdominal pain source. Today she reports she has no complaints. Pt notes, however, she continues to experience intermittent, chronic abd pain. Pt notes she has been taking 1/2 her dose of the oxycodone approximately 1/2 of the time.  Patient reports good fetal movement, denies any bleeding and no rupture of membranes symptoms or regular contractions.   BP weight and urine results reviewed and noted. Blood pressure 100/60, pulse 80, weight 133 lb (60.328 kg), last menstrual period 04/04/2015, unknown if currently breastfeeding.  Fetal Surveillance Testing today:  NA Fundal Height:  NA Fetal Heart rate: 154 Edema:  None Urinalysis: Negative   Questions were answered.  Lab and sonogram results have been reviewed. Comments: normal   Assessment:  1.  Pregnancy at 5675w3d,  Estimated Date of Delivery: 01/09/16 :                          2.  High risk pregnancy: Opoid dependence, abdominal pain source due to alleged chronic pancreatitis pain.  Medication(s) Plans: Renewed oxycodone 10.   Treatment Plan:  No changes.   Follow up in 2 weeks for appointment for opiate management, high risk OB care  By signing my name below, I, Marica OtterNusrat Rahman, attest that this documentation has been prepared under the direction and in the presence of Christin BachJohn Kenyatta Gloeckner, MD. Electronically Signed: Marica OtterNusrat Rahman, ED Scribe. 07/14/2015. 12:46 PM.   I personally performed the services described in this documentation, which was SCRIBED in my presence. The recorded information has been reviewed and considered accurate. It has been edited as necessary during review. Tilda BurrowFERGUSON,Audel Coakley V, MD

## 2015-07-21 ENCOUNTER — Telehealth: Payer: Self-pay | Admitting: Obstetrics and Gynecology

## 2015-07-21 NOTE — Telephone Encounter (Signed)
Pt called stating that she needs a refill of her pain medication, Pt states that she does not have enough for tonight. Please contact pt

## 2015-07-21 NOTE — Telephone Encounter (Signed)
Dr. Emelda FearFerguson is going to call and speak with the pt himself.

## 2015-07-21 NOTE — Telephone Encounter (Signed)
I left a message, informing patient that she stated at her last week visit that she was only requiring half a 10 mg tablet most of the time. Therefore she received a refil of 28 tabs and was advised to use them over the 2 wks. Pt now calls on day 7, saying she's out of meds, so either 1. She's upped her med use, 2 she was not telling the truth last week,  3 she is selling part of her meds.  Either way, med refils will be ONLY by appt. Pt will be seen tomorrow at 3 pm by me, . Pt advised to call for appt.

## 2015-07-22 ENCOUNTER — Ambulatory Visit (INDEPENDENT_AMBULATORY_CARE_PROVIDER_SITE_OTHER): Payer: Medicaid Other | Admitting: Obstetrics and Gynecology

## 2015-07-22 VITALS — BP 102/60 | HR 98 | Wt 131.2 lb

## 2015-07-22 DIAGNOSIS — Z331 Pregnant state, incidental: Secondary | ICD-10-CM

## 2015-07-22 DIAGNOSIS — O99322 Drug use complicating pregnancy, second trimester: Secondary | ICD-10-CM | POA: Diagnosis not present

## 2015-07-22 DIAGNOSIS — K861 Other chronic pancreatitis: Secondary | ICD-10-CM

## 2015-07-22 DIAGNOSIS — F1120 Opioid dependence, uncomplicated: Secondary | ICD-10-CM

## 2015-07-22 DIAGNOSIS — Z1389 Encounter for screening for other disorder: Secondary | ICD-10-CM | POA: Diagnosis not present

## 2015-07-22 DIAGNOSIS — O99612 Diseases of the digestive system complicating pregnancy, second trimester: Secondary | ICD-10-CM | POA: Diagnosis not present

## 2015-07-22 DIAGNOSIS — Z3492 Encounter for supervision of normal pregnancy, unspecified, second trimester: Secondary | ICD-10-CM

## 2015-07-22 DIAGNOSIS — O0992 Supervision of high risk pregnancy, unspecified, second trimester: Secondary | ICD-10-CM | POA: Diagnosis not present

## 2015-07-22 DIAGNOSIS — Z3A16 16 weeks gestation of pregnancy: Secondary | ICD-10-CM | POA: Diagnosis not present

## 2015-07-22 LAB — POCT URINALYSIS DIPSTICK
Ketones, UA: NEGATIVE
Leukocytes, UA: NEGATIVE
Nitrite, UA: NEGATIVE
RBC UA: NEGATIVE

## 2015-07-22 MED ORDER — OXYCODONE HCL 10 MG PO TABS: 10.0000 mg | ORAL_TABLET | Freq: Four times a day (QID) | ORAL | Status: DC
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
## 2015-07-27 ENCOUNTER — Encounter (HOSPITAL_COMMUNITY)
Admission: RE | Admit: 2015-07-27 | Discharge: 2015-07-27 | Disposition: A | Discharge status: Home / Self Care | Payer: Medicaid Other | Source: Ambulatory Visit | Attending: Obstetrics and Gynecology | Admitting: Obstetrics and Gynecology

## 2015-07-28 ENCOUNTER — Other Ambulatory Visit: Payer: Medicaid Other

## 2015-07-28 ENCOUNTER — Encounter: Payer: Medicaid Other | Admitting: Women's Health

## 2015-07-28 ENCOUNTER — Encounter: Payer: Medicaid Other | Admitting: Obstetrics and Gynecology

## 2015-08-04 ENCOUNTER — Ambulatory Visit (INDEPENDENT_AMBULATORY_CARE_PROVIDER_SITE_OTHER): Payer: Medicaid Other | Admitting: Obstetrics and Gynecology

## 2015-08-04 ENCOUNTER — Encounter: Payer: Self-pay | Admitting: Obstetrics and Gynecology

## 2015-08-04 VITALS — BP 100/60 | HR 85 | Wt 136.0 lb

## 2015-08-04 DIAGNOSIS — O99612 Diseases of the digestive system complicating pregnancy, second trimester: Secondary | ICD-10-CM | POA: Diagnosis not present

## 2015-08-04 DIAGNOSIS — O99322 Drug use complicating pregnancy, second trimester: Secondary | ICD-10-CM | POA: Diagnosis not present

## 2015-08-04 DIAGNOSIS — O09892 Supervision of other high risk pregnancies, second trimester: Secondary | ICD-10-CM

## 2015-08-04 DIAGNOSIS — Z3A18 18 weeks gestation of pregnancy: Secondary | ICD-10-CM | POA: Diagnosis not present

## 2015-08-04 DIAGNOSIS — Z331 Pregnant state, incidental: Secondary | ICD-10-CM

## 2015-08-04 DIAGNOSIS — Z1389 Encounter for screening for other disorder: Secondary | ICD-10-CM | POA: Diagnosis not present

## 2015-08-04 DIAGNOSIS — K861 Other chronic pancreatitis: Secondary | ICD-10-CM

## 2015-08-04 DIAGNOSIS — Z3492 Encounter for supervision of normal pregnancy, unspecified, second trimester: Secondary | ICD-10-CM

## 2015-08-04 LAB — POCT URINALYSIS DIPSTICK
GLUCOSE UA: NEGATIVE
Ketones, UA: NEGATIVE
Leukocytes, UA: NEGATIVE
Nitrite, UA: NEGATIVE
Protein, UA: NEGATIVE
RBC UA: NEGATIVE

## 2015-08-04 MED ORDER — OXYCODONE HCL 10 MG PO TABS: 10.0000 mg | ORAL_TABLET | Freq: Four times a day (QID) | ORAL | Status: DC

## 2015-08-04 NOTE — Progress Notes (Signed)
Patient ID: Amber BirkKatie C Dunn, female   DOB: Jul 30, 1985, 30 y.o.   MRN: [id number] 263w3d Estimated Date of Delivery: 01/09/16  Blood pressure 100/60, pulse 85, weight 136 lb (61.689 kg), last menstrual period 04/04/2015, unknown if currently breastfeeding.   refer to the ob flow sheet for FH and FHR, also BP, Wt, Urine results: negative  FHR: 152 FH: 16cm Discussed with pt risks and benefits of options for sterilization following delivery of child. At end of discussion, pt had opportunity to ask questions and has no further questions at this time.  Greater than 50% was spent in counseling and coordination of care with the patient. Total time greater than: 10 minutes  Patient reports  + good fetal movement, denies any bleeding and no rupture of membranes symptoms or regular contractions. Patient complaints: Pt denies any new complaints today. However, pt continues to have chronic, intermittent abd pain which she treats with oxycodone.   Questions were answered. Assessment: LROB [id number] @ 253w3d  alleged chronic pain s/p chronic pancreatitis. 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  Plan:   1. Continued routine obstetrical care 2. F/u in 2 weeks for colposcopy and routine OB care.    By signing my name below, I, Marica Otterusrat Rahman, attest that this documentation has been prepared under the direction and in the presence of Christin BachJohn Jeffory Snelgrove, MD. Electronically Signed: Marica OtterNusrat Rahman, ED Scribe. 08/04/2015. 2:58 PM  I personally performed the services described in this documentation, which was SCRIBED in my presence. The recorded information has been reviewed and considered accurate. It has been edited as necessary during review. Tilda BurrowFERGUSON,Rhyanna Sorce V, MD

## 2015-08-04 NOTE — Progress Notes (Signed)
Pt denies any problems or concerns at this time.  

## 2015-08-11 ENCOUNTER — Telehealth: Payer: Self-pay | Admitting: Obstetrics and Gynecology

## 2015-08-12 ENCOUNTER — Telehealth: Payer: Self-pay | Admitting: *Deleted

## 2015-08-12 ENCOUNTER — Emergency Department (HOSPITAL_COMMUNITY)
Admission: EM | Admit: 2015-08-12 | Discharge: 2015-08-13 | Disposition: A | Discharge status: Home / Self Care | Payer: Medicaid Other | Attending: Emergency Medicine | Admitting: Emergency Medicine

## 2015-08-12 ENCOUNTER — Encounter (HOSPITAL_COMMUNITY): Payer: Self-pay | Admitting: *Deleted

## 2015-08-12 DIAGNOSIS — F329 Major depressive disorder, single episode, unspecified: Secondary | ICD-10-CM | POA: Diagnosis not present

## 2015-08-12 DIAGNOSIS — F17210 Nicotine dependence, cigarettes, uncomplicated: Secondary | ICD-10-CM | POA: Diagnosis not present

## 2015-08-12 DIAGNOSIS — O211 Hyperemesis gravidarum with metabolic disturbance: Secondary | ICD-10-CM | POA: Diagnosis not present

## 2015-08-12 DIAGNOSIS — R197 Diarrhea, unspecified: Secondary | ICD-10-CM | POA: Insufficient documentation

## 2015-08-12 DIAGNOSIS — R1013 Epigastric pain: Secondary | ICD-10-CM | POA: Diagnosis not present

## 2015-08-12 DIAGNOSIS — I10 Essential (primary) hypertension: Secondary | ICD-10-CM | POA: Diagnosis not present

## 2015-08-12 DIAGNOSIS — G8929 Other chronic pain: Secondary | ICD-10-CM | POA: Insufficient documentation

## 2015-08-12 DIAGNOSIS — R109 Unspecified abdominal pain: Secondary | ICD-10-CM

## 2015-08-12 DIAGNOSIS — O26892 Other specified pregnancy related conditions, second trimester: Secondary | ICD-10-CM | POA: Diagnosis present

## 2015-08-12 DIAGNOSIS — R112 Nausea with vomiting, unspecified: Secondary | ICD-10-CM

## 2015-08-12 DIAGNOSIS — Z3490 Encounter for supervision of normal pregnancy, unspecified, unspecified trimester: Secondary | ICD-10-CM

## 2015-08-12 DIAGNOSIS — Z3A18 18 weeks gestation of pregnancy: Secondary | ICD-10-CM | POA: Insufficient documentation

## 2015-08-12 LAB — URINALYSIS, ROUTINE W REFLEX MICROSCOPIC
BILIRUBIN URINE: NEGATIVE
Glucose, UA: NEGATIVE mg/dL
Hgb urine dipstick: NEGATIVE
Ketones, ur: NEGATIVE mg/dL
Leukocytes, UA: NEGATIVE
NITRITE: NEGATIVE
PROTEIN: NEGATIVE mg/dL
SPECIFIC GRAVITY, URINE: 1.01 (ref 1.005–1.030)
pH: 7 (ref 5.0–8.0)

## 2015-08-12 LAB — COMPREHENSIVE METABOLIC PANEL
ALBUMIN: 3.3 g/dL — AB (ref 3.5–5.0)
ALT: 9 U/L — ABNORMAL LOW (ref 14–54)
ANION GAP: 7 (ref 5–15)
AST: 12 U/L — ABNORMAL LOW (ref 15–41)
Alkaline Phosphatase: 78 U/L (ref 38–126)
BUN: 5 mg/dL — ABNORMAL LOW (ref 6–20)
CO2: 22 mmol/L (ref 22–32)
Calcium: 9 mg/dL (ref 8.9–10.3)
Chloride: 106 mmol/L (ref 101–111)
Creatinine, Ser: 0.4 mg/dL — ABNORMAL LOW (ref 0.44–1.00)
GFR calc Af Amer: >60 mL/min (ref >60)
GFR calc non Af Amer: >60 mL/min (ref >60)
GLUCOSE: 87 mg/dL (ref 65–99)
POTASSIUM: 3.7 mmol/L (ref 3.5–5.1)
SODIUM: 135 mmol/L (ref 135–145)
TOTAL PROTEIN: 6.9 g/dL (ref 6.5–8.1)
Total Bilirubin: 0.2 mg/dL — ABNORMAL LOW (ref 0.3–1.2)

## 2015-08-12 LAB — CBC WITH DIFFERENTIAL/PLATELET
BASOS ABS: 0 10*3/uL (ref 0.0–0.1)
Basophils Relative: 0 %
Eosinophils Absolute: 0.3 10*3/uL (ref 0.0–0.7)
Eosinophils Relative: 3 %
HEMATOCRIT: 33.2 % — AB (ref 36.0–46.0)
Hemoglobin: 11.4 g/dL — ABNORMAL LOW (ref 12.0–15.0)
LYMPHS PCT: 24 %
Lymphs Abs: 2.7 10*3/uL (ref 0.7–4.0)
MCH: 30.4 pg (ref 26.0–34.0)
MCHC: 34.3 g/dL (ref 30.0–36.0)
MCV: 88.5 fL (ref 78.0–100.0)
MONO ABS: 0.9 10*3/uL (ref 0.1–1.0)
MONOS PCT: 8 %
Neutro Abs: 7.1 10*3/uL (ref 1.7–7.7)
Neutrophils Relative %: 65 %
Platelets: 315 10*3/uL (ref 150–400)
RBC: 3.75 MIL/uL — ABNORMAL LOW (ref 3.87–5.11)
RDW: 14.3 % (ref 11.5–15.5)
WBC: 10.9 10*3/uL — ABNORMAL HIGH (ref 4.0–10.5)

## 2015-08-12 LAB — LIPASE, BLOOD: Lipase: 21 U/L (ref 11–51)

## 2015-08-12 MED ORDER — HYDROMORPHONE HCL 1 MG/ML IJ SOLN
1.0000 mg | Freq: Once | INTRAMUSCULAR | Status: AC
  Administered 2015-08-12: 1 mg via INTRAVENOUS
  Filled 2015-08-12: qty 1

## 2015-08-12 MED ORDER — ONDANSETRON HCL 4 MG/2ML IJ SOLN
4.0000 mg | Freq: Once | INTRAMUSCULAR | Status: AC
  Administered 2015-08-12: 4 mg via INTRAVENOUS
  Filled 2015-08-12: qty 2

## 2015-08-12 MED ORDER — SODIUM CHLORIDE 0.9 % IV BOLUS (SEPSIS)
1000.0000 mL | Freq: Once | INTRAVENOUS | Status: AC
  Administered 2015-08-12: 1000 mL via INTRAVENOUS

## 2015-08-12 MED ORDER — OXYCODONE-ACETAMINOPHEN 5-325 MG PO TABS
2.0000 | ORAL_TABLET | Freq: Once | ORAL | Status: AC
  Administered 2015-08-12: 2 via ORAL
  Filled 2015-08-12: qty 2
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
## 2015-08-12 NOTE — ED Notes (Signed)
Pt c/o of epigastric pain that radiates around to her back and all over her addomen; pt states she has been vomiting today and having some diarrhea today

## 2015-08-12 NOTE — ED Notes (Signed)
Pt reports "pain is worse while waiting for pills to kick in". Informed pt that it takes about an hour for pain pill to have time to work.
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
## 2015-08-13 ENCOUNTER — Telehealth: Payer: Self-pay | Admitting: Obstetrics and Gynecology

## 2015-08-13 MED ORDER — HYDROMORPHONE HCL 1 MG/ML IJ SOLN
1.0000 mg | Freq: Once | INTRAMUSCULAR | Status: AC
  Administered 2015-08-13: 1 mg via INTRAVENOUS
  Filled 2015-08-13: qty 1

## 2015-08-13 NOTE — Telephone Encounter (Signed)
Pt called stating that she is returning Dr.Ferguson's call. Pt also states that she would like to talk to him about her medication. Please contact pt

## 2015-08-13 NOTE — ED Notes (Signed)
MD at bedside. 

## 2015-08-13 NOTE — Discharge Instructions (Signed)
Nausea and Vomiting  Nausea means you feel sick to your stomach. Throwing up (vomiting) is a reflex where stomach contents come out of your mouth.  HOME CARE   · Take medicine as told by your doctor.  · Do not force yourself to eat. However, you do need to drink fluids.  · If you feel like eating, eat a normal diet as told by your doctor.    Eat rice, wheat, potatoes, bread, lean meats, yogurt, fruits, and vegetables.    Avoid high-fat foods.  · Drink enough fluids to keep your pee (urine) clear or pale yellow.  · Ask your doctor how to replace body fluid losses (rehydrate). Signs of body fluid loss (dehydration) include:    Feeling very thirsty.    Dry lips and mouth.    Feeling dizzy.    Dark pee.    Peeing less than normal.    Feeling confused.    Fast breathing or heart rate.  GET HELP RIGHT AWAY IF:   · You have blood in your throw up.  · You have black or bloody poop (stool).  · You have a bad headache or stiff neck.  · You feel confused.  · You have bad belly (abdominal) pain.  · You have chest pain or trouble breathing.  · You do not pee at least once every 8 hours.  · You have cold, clammy skin.  · You keep throwing up after 24 to 48 hours.  · You have a fever.  MAKE SURE YOU:   · Understand these instructions.  · Will watch your condition.  · Will get help right away if you are not doing well or get worse.     This information is not intended to replace advice given to you by your health care provider. Make sure you discuss any questions you have with your health care provider.     Document Released: 09/22/2007 Document Revised: 06/28/2011 Document Reviewed: 09/04/2010  Elsevier Interactive Patient Education ©2016 [employer] Inc[employer]

## 2015-08-14 ENCOUNTER — Ambulatory Visit (INDEPENDENT_AMBULATORY_CARE_PROVIDER_SITE_OTHER): Payer: Medicaid Other | Admitting: Obstetrics and Gynecology

## 2015-08-14 VITALS — BP 124/62 | HR 110 | Wt 138.8 lb

## 2015-08-14 DIAGNOSIS — O99322 Drug use complicating pregnancy, second trimester: Secondary | ICD-10-CM

## 2015-08-14 DIAGNOSIS — Z1389 Encounter for screening for other disorder: Secondary | ICD-10-CM | POA: Diagnosis not present

## 2015-08-14 DIAGNOSIS — Z3A19 19 weeks gestation of pregnancy: Secondary | ICD-10-CM

## 2015-08-14 DIAGNOSIS — Z331 Pregnant state, incidental: Secondary | ICD-10-CM

## 2015-08-14 DIAGNOSIS — K861 Other chronic pancreatitis: Secondary | ICD-10-CM

## 2015-08-14 DIAGNOSIS — O0992 Supervision of high risk pregnancy, unspecified, second trimester: Secondary | ICD-10-CM

## 2015-08-14 DIAGNOSIS — O99612 Diseases of the digestive system complicating pregnancy, second trimester: Secondary | ICD-10-CM

## 2015-08-14 DIAGNOSIS — O210 Mild hyperemesis gravidarum: Secondary | ICD-10-CM

## 2015-08-14 LAB — POCT URINALYSIS DIPSTICK
Ketones, UA: NEGATIVE
LEUKOCYTES UA: NEGATIVE
NITRITE UA: NEGATIVE
PROTEIN UA: NEGATIVE
RBC UA: NEGATIVE

## 2015-08-14 MED ORDER — DOXYLAMINE-PYRIDOXINE 10-10 MG PO TBEC: DELAYED_RELEASE_TABLET | ORAL | Status: DC

## 2015-08-14 MED ORDER — PROMETHAZINE HCL 25 MG PO TABS: 25.0000 mg | ORAL_TABLET | Freq: Four times a day (QID) | ORAL | Status: DC | PRN

## 2015-08-14 MED ORDER — OXYCODONE HCL 10 MG PO TABS: 10.0000 mg | ORAL_TABLET | Freq: Four times a day (QID) | ORAL | Status: DC
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
## 2015-08-14 NOTE — Telephone Encounter (Signed)
Pt has appt for 2:45 today.
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
## 2015-08-18 ENCOUNTER — Other Ambulatory Visit: Payer: Self-pay | Admitting: Obstetrics and Gynecology

## 2015-08-18 DIAGNOSIS — Z1389 Encounter for screening for other disorder: Secondary | ICD-10-CM

## 2015-08-20 ENCOUNTER — Encounter: Payer: Self-pay | Admitting: Obstetrics and Gynecology

## 2015-08-20 ENCOUNTER — Ambulatory Visit (INDEPENDENT_AMBULATORY_CARE_PROVIDER_SITE_OTHER): Payer: Medicaid Other | Admitting: Obstetrics and Gynecology

## 2015-08-20 ENCOUNTER — Ambulatory Visit (INDEPENDENT_AMBULATORY_CARE_PROVIDER_SITE_OTHER): Payer: Medicaid Other

## 2015-08-20 VITALS — BP 90/50 | HR 86 | Ht 64.0 in | Wt 138.0 lb

## 2015-08-20 DIAGNOSIS — O09212 Supervision of pregnancy with history of pre-term labor, second trimester: Secondary | ICD-10-CM | POA: Diagnosis not present

## 2015-08-20 DIAGNOSIS — R87810 Cervical high risk human papillomavirus (HPV) DNA test positive: Secondary | ICD-10-CM | POA: Diagnosis not present

## 2015-08-20 DIAGNOSIS — Z36 Encounter for antenatal screening of mother: Secondary | ICD-10-CM | POA: Diagnosis not present

## 2015-08-20 DIAGNOSIS — Z1389 Encounter for screening for other disorder: Secondary | ICD-10-CM

## 2015-08-20 DIAGNOSIS — Z3492 Encounter for supervision of normal pregnancy, unspecified, second trimester: Secondary | ICD-10-CM

## 2015-08-20 DIAGNOSIS — R87610 Atypical squamous cells of undetermined significance on cytologic smear of cervix (ASC-US): Secondary | ICD-10-CM | POA: Diagnosis not present

## 2015-08-20 DIAGNOSIS — O09892 Supervision of other high risk pregnancies, second trimester: Secondary | ICD-10-CM

## 2015-08-20 DIAGNOSIS — Z331 Pregnant state, incidental: Secondary | ICD-10-CM

## 2015-08-20 LAB — POCT URINALYSIS DIPSTICK
Blood, UA: NEGATIVE
GLUCOSE UA: NEGATIVE
KETONES UA: NEGATIVE
LEUKOCYTES UA: NEGATIVE
Nitrite, UA: NEGATIVE
PROTEIN UA: NEGATIVE

## 2015-08-20 MED ORDER — OXYCODONE HCL 10 MG PO TABS: 10.0000 mg | ORAL_TABLET | Freq: Four times a day (QID) | ORAL | Status: DC

## 2015-08-20 MED ORDER — HYDROXYPROGESTERONE CAPROATE 250 MG/ML IM OIL
250.0000 mg | TOPICAL_OIL | Freq: Once | INTRAMUSCULAR | Status: AC
  Administered 2015-08-20: 250 mg via INTRAMUSCULAR

## 2015-08-20 NOTE — Progress Notes (Signed)
Patient ID: [name], female   DOB: 1985/08/13, 30 y.o.   MRN: [id number]  Amber BirkKatie C Kinnaird 30 y.o. [id number] here for colposcopy for ASCUS with POSITIVE high risk HPV pap smear on 05/19/15.  Discussed role for HPV in cervical dysplasia, need for surveillance.  Patient given informed consent, signed copy in the chart, time out was performed.  Placed in lithotomy position. Cervix viewed with speculum and colposcope after application of acetic acid.   Colposcopy adequate? Not obtained  Cervix multiparous, somewhat everted. No abnormal lesions noted   ECC specimen not obtained.  Colposcopy IMPRESSION: No abnormal lesions noted; biopsies not obtained   Routine preventative health maintenance measures emphasized.  FHR- 161 bpm  U-0 at the umbilicus   Pt is not on 17P. She reports her last child was delivered preterm at 32 weeks and was not afflicted with any opiate/narcotic addictions at birth.   I spent over 15 minutes with the visit with >than 50% spent in counseling and coordination of care.   Plan:  Will start on 17-p weekly until 36 weeks     By signing my name below, I, Doreatha MartinEva Mathews, attest that this documentation has been prepared under the direction and in the presence of Tilda BurrowJohn Jola Critzer V, MD. Electronically Signed: Doreatha MartinEva Mathews, ED Scribe. 08/20/2015. 12:04 PM.  I personally performed the services described in this documentation, which was SCRIBED in my presence. The recorded information has been reviewed and considered accurate. It has been edited as necessary during review. Tilda BurrowFERGUSON,Teyton Pattillo V, MD

## 2015-08-20 NOTE — Progress Notes (Signed)
US 19+5 wks,,ant pl,cephalic,svp of fluid 6.1 cm,normal ov's bilat,fhr 153 bpm,cx 3.3 cm,efw 391 g,anatomy complete,no obvious abnormalities seen

## 2015-08-21 ENCOUNTER — Telehealth: Payer: Self-pay | Admitting: *Deleted

## 2015-08-25 NOTE — Telephone Encounter (Signed)
Spoke with Toniann FailWendy at [facility]. Pt's medication will be shipped to our office.

## 2015-08-26 ENCOUNTER — Encounter (HOSPITAL_COMMUNITY)
Admission: RE | Admit: 2015-08-26 | Discharge: 2015-08-26 | Disposition: A | Discharge status: Home / Self Care | Payer: Medicaid Other | Source: Ambulatory Visit | Attending: Obstetrics and Gynecology | Admitting: Obstetrics and Gynecology

## 2015-08-27 ENCOUNTER — Ambulatory Visit: Payer: Medicaid Other

## 2015-08-27 ENCOUNTER — Inpatient Hospital Stay (EMERGENCY_DEPARTMENT_HOSPITAL)
Admission: AD | Admit: 2015-08-27 | Discharge: 2015-08-28 | Disposition: A | Discharge status: Home / Self Care | Payer: Medicaid Other | Source: Ambulatory Visit | Attending: Obstetrics and Gynecology | Admitting: Obstetrics and Gynecology

## 2015-08-27 DIAGNOSIS — O26892 Other specified pregnancy related conditions, second trimester: Secondary | ICD-10-CM | POA: Insufficient documentation

## 2015-08-27 DIAGNOSIS — O99332 Smoking (tobacco) complicating pregnancy, second trimester: Secondary | ICD-10-CM

## 2015-08-27 DIAGNOSIS — Z888 Allergy status to other drugs, medicaments and biological substances status: Secondary | ICD-10-CM

## 2015-08-27 DIAGNOSIS — F17210 Nicotine dependence, cigarettes, uncomplicated: Secondary | ICD-10-CM | POA: Insufficient documentation

## 2015-08-27 DIAGNOSIS — Z3A20 20 weeks gestation of pregnancy: Secondary | ICD-10-CM | POA: Insufficient documentation

## 2015-08-27 DIAGNOSIS — Z79891 Long term (current) use of opiate analgesic: Secondary | ICD-10-CM

## 2015-08-27 DIAGNOSIS — Z91030 Bee allergy status: Secondary | ICD-10-CM | POA: Insufficient documentation

## 2015-08-27 DIAGNOSIS — Z79899 Other long term (current) drug therapy: Secondary | ICD-10-CM

## 2015-08-27 DIAGNOSIS — Z91018 Allergy to other foods: Secondary | ICD-10-CM

## 2015-08-27 DIAGNOSIS — Z885 Allergy status to narcotic agent status: Secondary | ICD-10-CM | POA: Insufficient documentation

## 2015-08-27 DIAGNOSIS — M549 Dorsalgia, unspecified: Secondary | ICD-10-CM | POA: Insufficient documentation

## 2015-08-27 DIAGNOSIS — K861 Other chronic pancreatitis: Secondary | ICD-10-CM | POA: Insufficient documentation

## 2015-08-28 ENCOUNTER — Emergency Department (HOSPITAL_COMMUNITY)
Admission: EM | Admit: 2015-08-28 | Discharge: 2015-08-28 | Disposition: A | Discharge status: Home / Self Care | Payer: Medicaid Other | Attending: Emergency Medicine | Admitting: Emergency Medicine

## 2015-08-28 ENCOUNTER — Encounter (HOSPITAL_COMMUNITY): Payer: Self-pay | Admitting: *Deleted

## 2015-08-28 DIAGNOSIS — K861 Other chronic pancreatitis: Secondary | ICD-10-CM | POA: Diagnosis not present

## 2015-08-28 DIAGNOSIS — R109 Unspecified abdominal pain: Secondary | ICD-10-CM | POA: Insufficient documentation

## 2015-08-28 DIAGNOSIS — Z3A21 21 weeks gestation of pregnancy: Secondary | ICD-10-CM | POA: Diagnosis not present

## 2015-08-28 DIAGNOSIS — F17210 Nicotine dependence, cigarettes, uncomplicated: Secondary | ICD-10-CM | POA: Diagnosis not present

## 2015-08-28 DIAGNOSIS — F329 Major depressive disorder, single episode, unspecified: Secondary | ICD-10-CM | POA: Insufficient documentation

## 2015-08-28 DIAGNOSIS — I10 Essential (primary) hypertension: Secondary | ICD-10-CM | POA: Insufficient documentation

## 2015-08-28 DIAGNOSIS — O26892 Other specified pregnancy related conditions, second trimester: Secondary | ICD-10-CM | POA: Diagnosis present

## 2015-08-28 LAB — URINALYSIS, ROUTINE W REFLEX MICROSCOPIC
Bilirubin Urine: NEGATIVE
Bilirubin Urine: NEGATIVE
Glucose, UA: NEGATIVE mg/dL
Glucose, UA: NEGATIVE mg/dL
Hgb urine dipstick: NEGATIVE
Hgb urine dipstick: NEGATIVE
KETONES UR: 15 mg/dL — AB
LEUKOCYTES UA: NEGATIVE
NITRITE: NEGATIVE
NITRITE: NEGATIVE
PROTEIN: NEGATIVE mg/dL
PROTEIN: NEGATIVE mg/dL
Specific Gravity, Urine: 1.015 (ref 1.005–1.030)
Specific Gravity, Urine: 1.015 (ref 1.005–1.030)
pH: 6 (ref 5.0–8.0)
pH: 6 (ref 5.0–8.0)

## 2015-08-28 LAB — COMPREHENSIVE METABOLIC PANEL
ALBUMIN: 3.5 g/dL (ref 3.5–5.0)
ALK PHOS: 73 U/L (ref 38–126)
ALT: 10 U/L — AB (ref 14–54)
ALT: 9 U/L — ABNORMAL LOW (ref 14–54)
ANION GAP: 9 (ref 5–15)
AST: 17 U/L (ref 15–41)
AST: 14 U/L — ABNORMAL LOW (ref 15–41)
Albumin: 3.6 g/dL (ref 3.5–5.0)
Alkaline Phosphatase: 75 U/L (ref 38–126)
Anion gap: 12 (ref 5–15)
BILIRUBIN TOTAL: 0.4 mg/dL (ref 0.3–1.2)
BUN: 6 mg/dL (ref 6–20)
BUN: 5 mg/dL — ABNORMAL LOW (ref 6–20)
CALCIUM: 9.2 mg/dL (ref 8.9–10.3)
CHLORIDE: 105 mmol/L (ref 101–111)
CO2: 20 mmol/L — AB (ref 22–32)
CO2: 21 mmol/L — ABNORMAL LOW (ref 22–32)
CREATININE: 0.48 mg/dL (ref 0.44–1.00)
CREATININE: 0.49 mg/dL (ref 0.44–1.00)
Calcium: 9.3 mg/dL (ref 8.9–10.3)
Chloride: 105 mmol/L (ref 101–111)
GFR calc Af Amer: >60 mL/min (ref >60)
GFR calc non Af Amer: >60 mL/min (ref >60)
GFR calc non Af Amer: >60 mL/min (ref >60)
GLUCOSE: 90 mg/dL (ref 65–99)
Glucose, Bld: 99 mg/dL (ref 65–99)
Potassium: 3.9 mmol/L (ref 3.5–5.1)
Potassium: 4 mmol/L (ref 3.5–5.1)
SODIUM: 137 mmol/L (ref 135–145)
Sodium: 135 mmol/L (ref 135–145)
TOTAL PROTEIN: 7.6 g/dL (ref 6.5–8.1)
Total Bilirubin: 0.2 mg/dL — ABNORMAL LOW (ref 0.3–1.2)
Total Protein: 7.1 g/dL (ref 6.5–8.1)

## 2015-08-28 LAB — CBC WITH DIFFERENTIAL/PLATELET
BASOS ABS: 0 10*3/uL (ref 0.0–0.1)
Basophils Absolute: 0 10*3/uL (ref 0.0–0.1)
Basophils Relative: 0 %
Basophils Relative: 0 %
EOS ABS: 0.3 10*3/uL (ref 0.0–0.7)
EOS PCT: 2 %
EOS PCT: 2 %
Eosinophils Absolute: 0.2 10*3/uL (ref 0.0–0.7)
HCT: 33.5 % — ABNORMAL LOW (ref 36.0–46.0)
HCT: 35 % — ABNORMAL LOW (ref 36.0–46.0)
HEMOGLOBIN: 11.3 g/dL — AB (ref 12.0–15.0)
Hemoglobin: 11.8 g/dL — ABNORMAL LOW (ref 12.0–15.0)
LYMPHS ABS: 2.8 10*3/uL (ref 0.7–4.0)
LYMPHS PCT: 24 %
Lymphocytes Relative: 11 %
Lymphs Abs: 1.7 10*3/uL (ref 0.7–4.0)
MCH: 30 pg (ref 26.0–34.0)
MCH: 30.4 pg (ref 26.0–34.0)
MCHC: 33.7 g/dL (ref 30.0–36.0)
MCHC: 33.7 g/dL (ref 30.0–36.0)
MCV: 88.9 fL (ref 78.0–100.0)
MCV: 90.2 fL (ref 78.0–100.0)
Monocytes Absolute: 0.7 10*3/uL (ref 0.1–1.0)
Monocytes Absolute: 1 10*3/uL (ref 0.1–1.0)
Monocytes Relative: 6 %
Monocytes Relative: 7 %
NEUTROS PCT: 80 %
Neutro Abs: 7.8 10*3/uL — ABNORMAL HIGH (ref 1.7–7.7)
Neutro Abs: 12.7 10*3/uL — ABNORMAL HIGH (ref 1.7–7.7)
Neutrophils Relative %: 68 %
PLATELETS: 304 10*3/uL (ref 150–400)
Platelets: 299 10*3/uL (ref 150–400)
RBC: 3.77 MIL/uL — ABNORMAL LOW (ref 3.87–5.11)
RBC: 3.88 MIL/uL (ref 3.87–5.11)
RDW: 14.6 % (ref 11.5–15.5)
RDW: 14.3 % (ref 11.5–15.5)
WBC: 11.6 10*3/uL — AB (ref 4.0–10.5)
WBC: 15.7 10*3/uL — AB (ref 4.0–10.5)

## 2015-08-28 LAB — URINE MICROSCOPIC-ADD ON

## 2015-08-28 LAB — LIPASE, BLOOD
Lipase: 26 U/L (ref 11–51)
Lipase: 31 U/L (ref 11–51)

## 2015-08-28 LAB — AMYLASE: Amylase: 83 U/L (ref 28–100)

## 2015-08-28 MED ORDER — HYDROMORPHONE HCL 2 MG PO TABS: 2.0000 mg | ORAL_TABLET | Freq: Once | ORAL | Status: DC

## 2015-08-28 MED ORDER — HYDROMORPHONE HCL 1 MG/ML IJ SOLN
1.0000 mg | Freq: Once | INTRAMUSCULAR | Status: AC
  Administered 2015-08-28: 1 mg via INTRAVENOUS
  Filled 2015-08-28: qty 1

## 2015-08-28 MED ORDER — ONDANSETRON HCL 4 MG/2ML IJ SOLN
4.0000 mg | Freq: Once | INTRAMUSCULAR | Status: AC
  Administered 2015-08-28: 4 mg via INTRAVENOUS
  Filled 2015-08-28: qty 2

## 2015-08-28 MED ORDER — SODIUM CHLORIDE 0.9 % IV BOLUS (SEPSIS)
1000.0000 mL | Freq: Once | INTRAVENOUS | Status: AC
  Administered 2015-08-28: 1000 mL via INTRAVENOUS

## 2015-08-28 MED ORDER — SODIUM CHLORIDE 0.9 % IV SOLN
25.0000 mg | Freq: Once | INTRAVENOUS | Status: AC
  Administered 2015-08-28: 25 mg via INTRAVENOUS
  Filled 2015-08-28: qty 1

## 2015-08-28 MED ORDER — HYDROMORPHONE HCL 1 MG/ML IJ SOLN
1.0000 mg | Freq: Once | INTRAMUSCULAR | Status: AC
  Administered 2015-08-28: 1 mg via INTRAVENOUS

## 2015-08-28 MED ORDER — HYDROMORPHONE HCL 2 MG/ML IJ SOLN
2.0000 mg | Freq: Once | INTRAMUSCULAR | Status: AC
  Administered 2015-08-28: 2 mg via INTRAVENOUS
  Filled 2015-08-28: qty 1

## 2015-08-28 MED ORDER — ONDANSETRON 4 MG PO TBDP: ORAL_TABLET | ORAL | Status: DC

## 2015-08-28 MED ORDER — HYDROMORPHONE HCL 1 MG/ML IJ SOLN
1.0000 mg | INTRAMUSCULAR | Status: DC | PRN
  Administered 2015-08-28: 1 mg via INTRAVENOUS
  Filled 2015-08-28 (×2): qty 1

## 2015-08-28 MED ORDER — DIPHENHYDRAMINE HCL 25 MG PO CAPS: 50.0000 mg | ORAL_CAPSULE | Freq: Once | ORAL | Status: DC

## 2015-08-28 MED ORDER — ONDANSETRON HCL 4 MG/2ML IJ SOLN
4.0000 mg | Freq: Once | INTRAMUSCULAR | Status: AC
Start: 2015-08-28 — End: 2015-08-28
  Administered 2015-08-28: 4 mg via INTRAVENOUS
  Filled 2015-08-28: qty 2

## 2015-08-28 MED ORDER — SODIUM CHLORIDE 0.9 % IV SOLN
INTRAVENOUS | Status: DC
  Administered 2015-08-28: via INTRAVENOUS

## 2015-08-28 MED ORDER — ONDANSETRON 4 MG PREPACK (~~LOC~~): 1.0000 | ORAL_TABLET | Freq: Three times a day (TID) | ORAL | Status: DC | PRN

## 2015-08-28 NOTE — ED Notes (Signed)
Pt reporting her pain is worse, Dr. Clayborne DanaMesner informed.

## 2015-08-28 NOTE — ED Notes (Signed)
Fetal Heart tones assessed with primary assessment at 2127.
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
## 2015-08-28 NOTE — ED Notes (Signed)
Pt c/o abdominal pain x 1 day; pt states she has a hx of pancreatitis; pt states she is [redacted] weeks pregnant

## 2015-08-28 NOTE — Discharge Instructions (Signed)
Abdominal Pain During Pregnancy Belly (abdominal) pain is common during pregnancy. Most of the time, it is not a serious problem. Other times, it can be a sign that something is wrong with the pregnancy. Always tell your doctor if you have belly pain. HOME CARE Monitor your belly pain for any changes. The following actions may help you feel better:  Do not have sex (intercourse) or put anything in your vagina until you feel better.  Rest until your pain stops.  Drink clear fluids if you feel sick to your stomach (nauseous). Do not eat solid food until you feel better.  Only take medicine as told by your doctor.  Keep all doctor visits as told. GET HELP RIGHT AWAY IF:   You are bleeding, leaking fluid, or pieces of tissue come out of your vagina.  You have more pain or cramping.  You keep throwing up (vomiting).  You have pain when you pee (urinate) or have blood in your pee.  You have a fever.  You do not feel your baby moving as much.  You feel very weak or feel like passing out.  You have trouble breathing, with or without belly pain.  You have a very bad headache and belly pain.  You have fluid leaking from your vagina and belly pain.  You keep having watery poop (diarrhea).  Your belly pain does not go away after resting, or the pain gets worse. MAKE SURE YOU:   Understand these instructions.  Will watch your condition.  Will get help right away if you are not doing well or get worse.   This information is not intended to replace advice given to you by your health care provider. Make sure you discuss any questions you have with your health care provider.   Document Released: 03/24/2009 Document Revised: 12/06/2012 Document Reviewed: 11/02/2012 Elsevier Interactive Patient Education 2016 Elsevier Inc. Acute Pancreatitis Acute pancreatitis is a disease in which the pancreas becomes suddenly inflamed. The pancreas is a large gland located behind your stomach. The  pancreas produces enzymes that help digest food. The pancreas also releases the hormones glucagon and insulin that help regulate blood sugar. Damage to the pancreas occurs when the digestive enzymes from the pancreas are activated and begin attacking the pancreas before being released into the intestine. Most acute attacks last a couple of days and can cause serious complications. Some people become dehydrated and develop low blood pressure. In severe cases, bleeding into the pancreas can lead to shock and can be life-threatening. The lungs, heart, and kidneys may fail. CAUSES  Pancreatitis can happen to anyone. In some cases, the cause is unknown. Most cases are caused by:  Alcohol abuse.  Gallstones. Other less common causes are:  Certain medicines.  Exposure to certain chemicals.  Infection.  Damage caused by an accident (trauma).  Abdominal surgery. SYMPTOMS   Pain in the upper abdomen that may radiate to the back.  Tenderness and swelling of the abdomen.  Nausea and vomiting. DIAGNOSIS  Your caregiver will perform a physical exam. Blood and stool tests may be done to confirm the diagnosis. Imaging tests may also be done, such as X-rays, CT scans, or an ultrasound of the abdomen. TREATMENT  Treatment usually requires a stay in the hospital. Treatment may include:  Pain medicine.  Fluid replacement through an intravenous line (IV).  Placing a tube in the stomach to remove stomach contents and control vomiting.  Not eating for 3 or 4 days. This gives your pancreas  a rest, because enzymes are not being produced that can cause further damage.  Antibiotic medicines if your condition is caused by an infection.  Surgery of the pancreas or gallbladder. HOME CARE INSTRUCTIONS   Follow the diet advised by your caregiver. This may involve avoiding alcohol and decreasing the amount of fat in your diet.  Eat smaller, more frequent meals. This reduces the amount of digestive juices  the pancreas produces.  Drink enough fluids to keep your urine clear or pale yellow.  Only take over-the-counter or prescription medicines as directed by your caregiver.  Avoid drinking alcohol if it caused your condition.  Do not smoke.  Get plenty of rest.  Check your blood sugar at home as directed by your caregiver.  Keep all follow-up appointments as directed by your caregiver. SEEK MEDICAL CARE IF:   You do not recover as quickly as expected.  You develop new or worsening symptoms.  You have persistent pain, weakness, or nausea.  You recover and then have another episode of pain. SEEK IMMEDIATE MEDICAL CARE IF:   You are unable to eat or keep fluids down.  Your pain becomes severe.  You have a fever or persistent symptoms for more than 2 to 3 days.  You have a fever and your symptoms suddenly get worse.  Your skin or the white part of your eyes turn yellow (jaundice).  You develop vomiting.  You feel dizzy, or you faint.  Your blood sugar is high (over 300 mg/dL). MAKE SURE YOU:   Understand these instructions.  Will watch your condition.  Will get help right away if you are not doing well or get worse.   This information is not intended to replace advice given to you by your health care provider. Make sure you discuss any questions you have with your health care provider.   Document Released: 04/05/2005 Document Revised: 10/05/2011 Document Reviewed: 07/15/2011 Elsevier Interactive Patient Education [employer]2016 Elsevier Inc.

## 2015-08-28 NOTE — ED Notes (Signed)
Pt reports her pain is no better, Dr. Clayborne DanaMesner informed.
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
## 2015-08-28 NOTE — MAU Note (Signed)
Patient presents at [redacted] weeks gestation with hx of chronic pancreatitis and c/o nausea, vomiting and abdominal pain today. Fetus active. Denies bleeding or discharge.

## 2015-08-30 ENCOUNTER — Encounter (HOSPITAL_COMMUNITY): Payer: Self-pay | Admitting: *Deleted

## 2015-08-30 ENCOUNTER — Inpatient Hospital Stay (HOSPITAL_COMMUNITY)
Admission: AD | Admit: 2015-08-30 | Discharge: 2015-08-31 | Disposition: A | Discharge status: Home / Self Care | Payer: Medicaid Other | Source: Ambulatory Visit | Attending: Obstetrics & Gynecology | Admitting: Obstetrics & Gynecology

## 2015-08-30 DIAGNOSIS — A630 Anogenital (venereal) warts: Secondary | ICD-10-CM | POA: Insufficient documentation

## 2015-08-30 DIAGNOSIS — Z3A21 21 weeks gestation of pregnancy: Secondary | ICD-10-CM | POA: Insufficient documentation

## 2015-08-30 DIAGNOSIS — O26892 Other specified pregnancy related conditions, second trimester: Secondary | ICD-10-CM | POA: Insufficient documentation

## 2015-08-30 DIAGNOSIS — K219 Gastro-esophageal reflux disease without esophagitis: Secondary | ICD-10-CM | POA: Diagnosis not present

## 2015-08-30 DIAGNOSIS — E860 Dehydration: Secondary | ICD-10-CM | POA: Diagnosis not present

## 2015-08-30 DIAGNOSIS — O99332 Smoking (tobacco) complicating pregnancy, second trimester: Secondary | ICD-10-CM | POA: Diagnosis not present

## 2015-08-30 DIAGNOSIS — R1030 Lower abdominal pain, unspecified: Secondary | ICD-10-CM | POA: Insufficient documentation

## 2015-08-30 DIAGNOSIS — O26899 Other specified pregnancy related conditions, unspecified trimester: Secondary | ICD-10-CM

## 2015-08-30 DIAGNOSIS — F17210 Nicotine dependence, cigarettes, uncomplicated: Secondary | ICD-10-CM | POA: Insufficient documentation

## 2015-08-30 DIAGNOSIS — O99612 Diseases of the digestive system complicating pregnancy, second trimester: Secondary | ICD-10-CM | POA: Insufficient documentation

## 2015-08-30 DIAGNOSIS — K859 Acute pancreatitis without necrosis or infection, unspecified: Secondary | ICD-10-CM | POA: Diagnosis not present

## 2015-08-30 DIAGNOSIS — O98312 Other infections with a predominantly sexual mode of transmission complicating pregnancy, second trimester: Secondary | ICD-10-CM | POA: Insufficient documentation

## 2015-08-30 DIAGNOSIS — F418 Other specified anxiety disorders: Secondary | ICD-10-CM | POA: Insufficient documentation

## 2015-08-30 DIAGNOSIS — O9989 Other specified diseases and conditions complicating pregnancy, childbirth and the puerperium: Secondary | ICD-10-CM | POA: Diagnosis not present

## 2015-08-30 DIAGNOSIS — R109 Unspecified abdominal pain: Secondary | ICD-10-CM | POA: Diagnosis not present

## 2015-08-30 DIAGNOSIS — O09212 Supervision of pregnancy with history of pre-term labor, second trimester: Secondary | ICD-10-CM

## 2015-08-30 DIAGNOSIS — O09892 Supervision of other high risk pregnancies, second trimester: Secondary | ICD-10-CM

## 2015-08-30 DIAGNOSIS — O99342 Other mental disorders complicating pregnancy, second trimester: Secondary | ICD-10-CM | POA: Insufficient documentation

## 2015-08-30 DIAGNOSIS — Z3492 Encounter for supervision of normal pregnancy, unspecified, second trimester: Secondary | ICD-10-CM

## 2015-08-30 LAB — URINE MICROSCOPIC-ADD ON

## 2015-08-30 LAB — WET PREP, GENITAL
CLUE CELLS WET PREP: NONE SEEN
SPERM: NONE SEEN
Trich, Wet Prep: NONE SEEN
Yeast Wet Prep HPF POC: NONE SEEN

## 2015-08-30 LAB — URINALYSIS, ROUTINE W REFLEX MICROSCOPIC
GLUCOSE, UA: NEGATIVE mg/dL
HGB URINE DIPSTICK: NEGATIVE
Ketones, ur: >80 mg/dL — AB
Leukocytes, UA: NEGATIVE
Nitrite: NEGATIVE
PROTEIN: 30 mg/dL — AB
Specific Gravity, Urine: 1.025 (ref 1.005–1.030)
pH: 6 (ref 5.0–8.0)

## 2015-08-30 NOTE — MAU Note (Addendum)
Pt reports c/o abd pain and cramping about 7 pm. Every 4-5 min. Pt stated she has been having pain in her pancreatis.Stated she feels like the baby has "dropped low' . Pt also reports having some mucusy discharge that was yellow.
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
## 2015-08-31 DIAGNOSIS — O9989 Other specified diseases and conditions complicating pregnancy, childbirth and the puerperium: Secondary | ICD-10-CM | POA: Diagnosis not present

## 2015-08-31 DIAGNOSIS — R109 Unspecified abdominal pain: Secondary | ICD-10-CM | POA: Diagnosis not present

## 2015-08-31 LAB — URINE CULTURE

## 2015-08-31 LAB — AMYLASE: Amylase: 51 U/L (ref 28–100)

## 2015-08-31 LAB — CBC
HCT: 31.9 % — ABNORMAL LOW (ref 36.0–46.0)
HEMOGLOBIN: 10.9 g/dL — AB (ref 12.0–15.0)
MCH: 30.1 pg (ref 26.0–34.0)
MCHC: 34.2 g/dL (ref 30.0–36.0)
MCV: 88.1 fL (ref 78.0–100.0)
PLATELETS: 310 10*3/uL (ref 150–400)
RBC: 3.62 MIL/uL — AB (ref 3.87–5.11)
RDW: 14.2 % (ref 11.5–15.5)
WBC: 12.5 10*3/uL — ABNORMAL HIGH (ref 4.0–10.5)

## 2015-08-31 LAB — COMPREHENSIVE METABOLIC PANEL
ALK PHOS: 69 U/L (ref 38–126)
ALT: 8 U/L — AB (ref 14–54)
AST: 11 U/L — ABNORMAL LOW (ref 15–41)
Albumin: 3.6 g/dL (ref 3.5–5.0)
Anion gap: 8 (ref 5–15)
BUN: 7 mg/dL (ref 6–20)
CALCIUM: 8.9 mg/dL (ref 8.9–10.3)
CO2: 23 mmol/L (ref 22–32)
Chloride: 103 mmol/L (ref 101–111)
Creatinine, Ser: 0.45 mg/dL (ref 0.44–1.00)
Glucose, Bld: 103 mg/dL — ABNORMAL HIGH (ref 65–99)
Potassium: 3.8 mmol/L (ref 3.5–5.1)
Sodium: 134 mmol/L — ABNORMAL LOW (ref 135–145)
TOTAL PROTEIN: 7.5 g/dL (ref 6.5–8.1)
Total Bilirubin: 0.2 mg/dL — ABNORMAL LOW (ref 0.3–1.2)

## 2015-08-31 LAB — LIPASE, BLOOD: Lipase: 18 U/L (ref 11–51)

## 2015-08-31 MED ORDER — LACTATED RINGERS IV SOLN
INTRAVENOUS | Status: AC
  Administered 2015-08-31: via INTRAVENOUS

## 2015-08-31 MED ORDER — HYDROMORPHONE HCL 1 MG/ML IJ SOLN
0.5000 mg | Freq: Once | INTRAMUSCULAR | Status: AC
  Administered 2015-08-31: 0.5 mg via INTRAVENOUS
  Filled 2015-08-31: qty 1

## 2015-08-31 MED ORDER — ONDANSETRON HCL 4 MG/2ML IJ SOLN
4.0000 mg | Freq: Once | INTRAMUSCULAR | Status: AC
  Administered 2015-08-31: 4 mg via INTRAVENOUS
  Filled 2015-08-31: qty 2

## 2015-08-31 NOTE — Discharge Instructions (Signed)

## 2015-09-01 ENCOUNTER — Encounter (HOSPITAL_COMMUNITY): Payer: Self-pay

## 2015-09-01 ENCOUNTER — Emergency Department (HOSPITAL_COMMUNITY)
Admission: EM | Admit: 2015-09-01 | Discharge: 2015-09-01 | Disposition: A | Discharge status: Home / Self Care | Payer: Medicaid Other | Attending: Emergency Medicine | Admitting: Emergency Medicine

## 2015-09-01 DIAGNOSIS — F17210 Nicotine dependence, cigarettes, uncomplicated: Secondary | ICD-10-CM | POA: Diagnosis not present

## 2015-09-01 DIAGNOSIS — Z3490 Encounter for supervision of normal pregnancy, unspecified, unspecified trimester: Secondary | ICD-10-CM

## 2015-09-01 DIAGNOSIS — Z79899 Other long term (current) drug therapy: Secondary | ICD-10-CM | POA: Insufficient documentation

## 2015-09-01 DIAGNOSIS — I10 Essential (primary) hypertension: Secondary | ICD-10-CM | POA: Insufficient documentation

## 2015-09-01 DIAGNOSIS — O219 Vomiting of pregnancy, unspecified: Secondary | ICD-10-CM | POA: Insufficient documentation

## 2015-09-01 DIAGNOSIS — R109 Unspecified abdominal pain: Secondary | ICD-10-CM

## 2015-09-01 DIAGNOSIS — O99332 Smoking (tobacco) complicating pregnancy, second trimester: Secondary | ICD-10-CM | POA: Diagnosis not present

## 2015-09-01 DIAGNOSIS — Z79891 Long term (current) use of opiate analgesic: Secondary | ICD-10-CM | POA: Insufficient documentation

## 2015-09-01 DIAGNOSIS — O26892 Other specified pregnancy related conditions, second trimester: Secondary | ICD-10-CM | POA: Diagnosis present

## 2015-09-01 DIAGNOSIS — R1084 Generalized abdominal pain: Secondary | ICD-10-CM | POA: Diagnosis not present

## 2015-09-01 DIAGNOSIS — Z3A22 22 weeks gestation of pregnancy: Secondary | ICD-10-CM | POA: Diagnosis not present

## 2015-09-01 DIAGNOSIS — F329 Major depressive disorder, single episode, unspecified: Secondary | ICD-10-CM | POA: Insufficient documentation

## 2015-09-01 LAB — COMPREHENSIVE METABOLIC PANEL
ALK PHOS: 67 U/L (ref 38–126)
ALT: 8 U/L — AB (ref 14–54)
AST: 11 U/L — AB (ref 15–41)
Albumin: 3.6 g/dL (ref 3.5–5.0)
Anion gap: 7 (ref 5–15)
BILIRUBIN TOTAL: 0.4 mg/dL (ref 0.3–1.2)
BUN: 7 mg/dL (ref 6–20)
CALCIUM: 8.8 mg/dL — AB (ref 8.9–10.3)
CO2: 24 mmol/L (ref 22–32)
CREATININE: 0.44 mg/dL (ref 0.44–1.00)
Chloride: 103 mmol/L (ref 101–111)
Glucose, Bld: 96 mg/dL (ref 65–99)
Potassium: 3.9 mmol/L (ref 3.5–5.1)
Sodium: 134 mmol/L — ABNORMAL LOW (ref 135–145)
TOTAL PROTEIN: 7.3 g/dL (ref 6.5–8.1)

## 2015-09-01 LAB — URINALYSIS, ROUTINE W REFLEX MICROSCOPIC
Bilirubin Urine: NEGATIVE
GLUCOSE, UA: NEGATIVE mg/dL
HGB URINE DIPSTICK: NEGATIVE
Ketones, ur: NEGATIVE mg/dL
Leukocytes, UA: NEGATIVE
Nitrite: NEGATIVE
PROTEIN: NEGATIVE mg/dL
Specific Gravity, Urine: <1.005 — ABNORMAL LOW (ref 1.005–1.030)
pH: 6 (ref 5.0–8.0)

## 2015-09-01 LAB — CBC WITH DIFFERENTIAL/PLATELET
Basophils Absolute: 0 10*3/uL (ref 0.0–0.1)
Basophils Relative: 0 %
EOS ABS: 0.2 10*3/uL (ref 0.0–0.7)
EOS PCT: 2 %
HCT: 32.5 % — ABNORMAL LOW (ref 36.0–46.0)
Hemoglobin: 11 g/dL — ABNORMAL LOW (ref 12.0–15.0)
LYMPHS ABS: 1.8 10*3/uL (ref 0.7–4.0)
Lymphocytes Relative: 20 %
MCH: 30.8 pg (ref 26.0–34.0)
MCHC: 33.8 g/dL (ref 30.0–36.0)
MCV: 91 fL (ref 78.0–100.0)
Monocytes Absolute: 0.5 10*3/uL (ref 0.1–1.0)
Monocytes Relative: 6 %
Neutro Abs: 6.4 10*3/uL (ref 1.7–7.7)
Neutrophils Relative %: 72 %
PLATELETS: 302 10*3/uL (ref 150–400)
RBC: 3.57 MIL/uL — AB (ref 3.87–5.11)
RDW: 14 % (ref 11.5–15.5)
WBC: 8.9 10*3/uL (ref 4.0–10.5)

## 2015-09-01 LAB — GC/CHLAMYDIA PROBE AMP (~~LOC~~) NOT AT ARMC
CHLAMYDIA, DNA PROBE: NEGATIVE
NEISSERIA GONORRHEA: NEGATIVE

## 2015-09-01 LAB — LIPASE, BLOOD: LIPASE: 22 U/L (ref 11–51)

## 2015-09-01 MED ORDER — ONDANSETRON 8 MG PO TBDP
8.0000 mg | ORAL_TABLET | Freq: Three times a day (TID) | ORAL | Status: DC | PRN
Start: 2015-09-01 — End: 2015-09-29

## 2015-09-01 MED ORDER — SODIUM CHLORIDE 0.9 % IV BOLUS (SEPSIS)
1000.0000 mL | Freq: Once | INTRAVENOUS | Status: AC
  Administered 2015-09-01: 1000 mL via INTRAVENOUS

## 2015-09-01 MED ORDER — SODIUM CHLORIDE 0.9 % IV BOLUS (SEPSIS)
500.0000 mL | Freq: Once | INTRAVENOUS | Status: AC
  Administered 2015-09-01: 500 mL via INTRAVENOUS

## 2015-09-01 MED ORDER — HYDROMORPHONE HCL 1 MG/ML IJ SOLN
1.0000 mg | Freq: Once | INTRAMUSCULAR | Status: AC
  Administered 2015-09-01: 1 mg via INTRAVENOUS
  Filled 2015-09-01: qty 1

## 2015-09-01 MED ORDER — ONDANSETRON HCL 4 MG/2ML IJ SOLN
4.0000 mg | Freq: Once | INTRAMUSCULAR | Status: AC
  Administered 2015-09-01: 4 mg via INTRAVENOUS
  Filled 2015-09-01: qty 2

## 2015-09-01 MED ORDER — HYDROMORPHONE HCL 1 MG/ML IJ SOLN
1.0000 mg | Freq: Once | INTRAMUSCULAR | Status: AC
Start: 2015-09-01 — End: 2015-09-01
  Administered 2015-09-01: 1 mg via INTRAVENOUS
  Filled 2015-09-01: qty 1

## 2015-09-01 MED FILL — Ondansetron HCl Tab 4 MG: ORAL | Qty: 4 | Status: AC

## 2015-09-01 NOTE — ED Notes (Signed)
Patient verbalizes understanding of discharge instructions, prescriptions, home care and follow up care. Patient out of department at this time. 

## 2015-09-01 NOTE — ED Notes (Signed)
Patient provided drink at this time

## 2015-09-01 NOTE — ED Notes (Signed)
Patient called out stating "my pain is so bad" EDP aware

## 2015-09-01 NOTE — ED Notes (Signed)
Patient ambulatory with steady gait to restroom. Female visitor escorting patient.

## 2015-09-01 NOTE — ED Notes (Signed)
EDP at bedside  

## 2015-09-01 NOTE — Discharge Instructions (Signed)
Tests showed no life-threatening condition. Prescription for nausea medicine. Follow-up with your OB/GYN tomorrow.
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
## 2015-09-01 NOTE — ED Notes (Signed)
Patient states pain is increasing again patient stats "it helped a little bit, but now its going back up" patient lying in bed, pressing abdominal area with hands, and kicking feet on bed. Family member at bedside, is sitting in chair with head lying on bed. NO ACUTE DISTRESS noted from patient, Even non labored respirations-explained to patient that we are waiting for lab results to result. Patient states understanding

## 2015-09-01 NOTE — ED Notes (Addendum)
Pt reports that she has been cramping this morning in lower abdomen. Vomited 30 minutes ago. Unable to keep liquids downs. Currently [redacted] weeks pregnant. Has hx of pancreatis as well

## 2015-09-02 ENCOUNTER — Encounter: Payer: Self-pay | Admitting: Obstetrics and Gynecology

## 2015-09-02 ENCOUNTER — Telehealth: Payer: Self-pay | Admitting: *Deleted

## 2015-09-02 ENCOUNTER — Ambulatory Visit (INDEPENDENT_AMBULATORY_CARE_PROVIDER_SITE_OTHER): Payer: Medicaid Other | Admitting: Obstetrics and Gynecology

## 2015-09-02 VITALS — BP 100/60 | HR 93 | Wt 136.0 lb

## 2015-09-02 DIAGNOSIS — Z331 Pregnant state, incidental: Secondary | ICD-10-CM

## 2015-09-02 DIAGNOSIS — O09892 Supervision of other high risk pregnancies, second trimester: Secondary | ICD-10-CM | POA: Diagnosis not present

## 2015-09-02 DIAGNOSIS — O99612 Diseases of the digestive system complicating pregnancy, second trimester: Secondary | ICD-10-CM | POA: Diagnosis not present

## 2015-09-02 DIAGNOSIS — Z1389 Encounter for screening for other disorder: Secondary | ICD-10-CM

## 2015-09-02 DIAGNOSIS — O99322 Drug use complicating pregnancy, second trimester: Secondary | ICD-10-CM

## 2015-09-02 DIAGNOSIS — Z3A21 21 weeks gestation of pregnancy: Secondary | ICD-10-CM

## 2015-09-02 DIAGNOSIS — Z3492 Encounter for supervision of normal pregnancy, unspecified, second trimester: Secondary | ICD-10-CM

## 2015-09-02 DIAGNOSIS — K861 Other chronic pancreatitis: Secondary | ICD-10-CM

## 2015-09-02 LAB — POCT URINALYSIS DIPSTICK
Blood, UA: NEGATIVE
Ketones, UA: NEGATIVE
Leukocytes, UA: NEGATIVE
Nitrite, UA: NEGATIVE
Protein, UA: NEGATIVE

## 2015-09-02 MED ORDER — OXYCODONE HCL 10 MG PO TABS: 10.0000 mg | ORAL_TABLET | Freq: Four times a day (QID) | ORAL | Status: DC

## 2015-09-02 NOTE — Progress Notes (Signed)
Pt worked in today for vaginal pain and watery discharge.

## 2015-09-02 NOTE — Telephone Encounter (Signed)
Pt c/o a lot of vaginal pain and pressure, increase mucus vaginal discharge, states is concerned has had a history of fetal death due to preterm birth. Pt given an appt today for evaluation. Pt also advised if pain increases before her 2 pm appt today with Dr. Emelda FearFerguson [facility]. Pt verbalized understanding.

## 2015-09-03 ENCOUNTER — Encounter: Payer: Medicaid Other | Admitting: Obstetrics and Gynecology

## 2015-09-08 ENCOUNTER — Telehealth: Payer: Self-pay | Admitting: *Deleted

## 2015-09-08 NOTE — Telephone Encounter (Signed)
[facility] several questions in regards to positive UDS, benzo, THC, Oxycodone. Call transferred to Dr. Emelda FearFerguson.
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
## 2015-09-11 ENCOUNTER — Encounter (HOSPITAL_COMMUNITY): Payer: Self-pay

## 2015-09-11 ENCOUNTER — Inpatient Hospital Stay (HOSPITAL_COMMUNITY)
Admission: AD | Admit: 2015-09-11 | Discharge: 2015-09-11 | Disposition: A | Discharge status: Home / Self Care | Payer: Medicaid Other | Source: Ambulatory Visit | Attending: Obstetrics & Gynecology | Admitting: Obstetrics & Gynecology

## 2015-09-11 DIAGNOSIS — O99332 Smoking (tobacco) complicating pregnancy, second trimester: Secondary | ICD-10-CM | POA: Diagnosis not present

## 2015-09-11 DIAGNOSIS — R109 Unspecified abdominal pain: Secondary | ICD-10-CM | POA: Diagnosis present

## 2015-09-11 DIAGNOSIS — R0989 Other specified symptoms and signs involving the circulatory and respiratory systems: Secondary | ICD-10-CM

## 2015-09-11 DIAGNOSIS — R1012 Left upper quadrant pain: Secondary | ICD-10-CM

## 2015-09-11 DIAGNOSIS — O99342 Other mental disorders complicating pregnancy, second trimester: Secondary | ICD-10-CM | POA: Diagnosis not present

## 2015-09-11 DIAGNOSIS — F17210 Nicotine dependence, cigarettes, uncomplicated: Secondary | ICD-10-CM | POA: Diagnosis not present

## 2015-09-11 DIAGNOSIS — Z765 Malingerer [conscious simulation]: Secondary | ICD-10-CM | POA: Diagnosis not present

## 2015-09-11 DIAGNOSIS — Z3A22 22 weeks gestation of pregnancy: Secondary | ICD-10-CM | POA: Insufficient documentation

## 2015-09-11 DIAGNOSIS — F418 Other specified anxiety disorders: Secondary | ICD-10-CM | POA: Insufficient documentation

## 2015-09-11 DIAGNOSIS — O99612 Diseases of the digestive system complicating pregnancy, second trimester: Secondary | ICD-10-CM | POA: Diagnosis not present

## 2015-09-11 DIAGNOSIS — K219 Gastro-esophageal reflux disease without esophagitis: Secondary | ICD-10-CM | POA: Insufficient documentation

## 2015-09-11 DIAGNOSIS — G8929 Other chronic pain: Secondary | ICD-10-CM

## 2015-09-11 DIAGNOSIS — R1011 Right upper quadrant pain: Secondary | ICD-10-CM

## 2015-09-11 DIAGNOSIS — O26892 Other specified pregnancy related conditions, second trimester: Secondary | ICD-10-CM | POA: Diagnosis not present

## 2015-09-11 DIAGNOSIS — R1010 Upper abdominal pain, unspecified: Secondary | ICD-10-CM | POA: Diagnosis not present

## 2015-09-11 DIAGNOSIS — K861 Other chronic pancreatitis: Secondary | ICD-10-CM | POA: Diagnosis not present

## 2015-09-11 LAB — RAPID URINE DRUG SCREEN, HOSP PERFORMED
AMPHETAMINES: NOT DETECTED
BARBITURATES: NOT DETECTED
Benzodiazepines: POSITIVE — AB
COCAINE: NOT DETECTED
Opiates: NOT DETECTED
TETRAHYDROCANNABINOL: NOT DETECTED

## 2015-09-11 LAB — PROTEIN / CREATININE RATIO, URINE: Creatinine, Urine: 31 mg/dL

## 2015-09-11 LAB — URINALYSIS, ROUTINE W REFLEX MICROSCOPIC
Bilirubin Urine: NEGATIVE
Glucose, UA: 100 mg/dL — AB
HGB URINE DIPSTICK: NEGATIVE
Ketones, ur: NEGATIVE mg/dL
Leukocytes, UA: NEGATIVE
Nitrite: NEGATIVE
PH: 6.5 (ref 5.0–8.0)
Protein, ur: NEGATIVE mg/dL
SPECIFIC GRAVITY, URINE: 1.01 (ref 1.005–1.030)

## 2015-09-11 LAB — COMPREHENSIVE METABOLIC PANEL
ALT: 9 U/L — AB (ref 14–54)
AST: 13 U/L — AB (ref 15–41)
Albumin: 3.4 g/dL — ABNORMAL LOW (ref 3.5–5.0)
Alkaline Phosphatase: 78 U/L (ref 38–126)
Anion gap: 10 (ref 5–15)
BILIRUBIN TOTAL: 0.3 mg/dL (ref 0.3–1.2)
BUN: 6 mg/dL (ref 6–20)
CO2: 23 mmol/L (ref 22–32)
CREATININE: 0.41 mg/dL — AB (ref 0.44–1.00)
Calcium: 8.7 mg/dL — ABNORMAL LOW (ref 8.9–10.3)
Chloride: 104 mmol/L (ref 101–111)
Glucose, Bld: 87 mg/dL (ref 65–99)
Potassium: 3.8 mmol/L (ref 3.5–5.1)
Sodium: 137 mmol/L (ref 135–145)
TOTAL PROTEIN: 7.1 g/dL (ref 6.5–8.1)

## 2015-09-11 LAB — AMYLASE: Amylase: 80 U/L (ref 28–100)

## 2015-09-11 LAB — CBC
HEMATOCRIT: 31.5 % — AB (ref 36.0–46.0)
Hemoglobin: 10.5 g/dL — ABNORMAL LOW (ref 12.0–15.0)
MCH: 30 pg (ref 26.0–34.0)
MCHC: 33.3 g/dL (ref 30.0–36.0)
MCV: 90 fL (ref 78.0–100.0)
Platelets: 297 10*3/uL (ref 150–400)
RBC: 3.5 MIL/uL — ABNORMAL LOW (ref 3.87–5.11)
RDW: 14.2 % (ref 11.5–15.5)
WBC: 10.7 10*3/uL — AB (ref 4.0–10.5)

## 2015-09-11 LAB — LIPASE, BLOOD: Lipase: 21 U/L (ref 11–51)

## 2015-09-11 MED ORDER — OXYCODONE HCL 5 MG PO TABS
5.0000 mg | ORAL_TABLET | Freq: Once | ORAL | Status: AC
  Administered 2015-09-11: 5 mg via ORAL
  Filled 2015-09-11: qty 1

## 2015-09-11 MED ORDER — ONDANSETRON HCL 4 MG/2ML IJ SOLN
4.0000 mg | Freq: Once | INTRAMUSCULAR | Status: AC
  Administered 2015-09-11: 4 mg via INTRAVENOUS
  Filled 2015-09-11: qty 2

## 2015-09-11 MED ORDER — FAMOTIDINE IN NACL 20-0.9 MG/50ML-% IV SOLN
20.0000 mg | Freq: Once | INTRAVENOUS | Status: AC
  Administered 2015-09-11: 20 mg via INTRAVENOUS
  Filled 2015-09-11: qty 50

## 2015-09-11 MED ORDER — OXYCODONE-ACETAMINOPHEN 5-325 MG PO TABS: 1.0000 | ORAL_TABLET | Freq: Once | ORAL | Status: DC

## 2015-09-11 MED ORDER — LACTATED RINGERS IV SOLN
INTRAVENOUS | Status: DC
  Administered 2015-09-11: 18:00:00 via INTRAVENOUS

## 2015-09-11 NOTE — MAU Note (Signed)
Pt presents to MAU with complaints of pain in her upper back and abdomen. Pt denies any vaginal bleeding or abnormal discharge. Reports a HX of pancreatitis.

## 2015-09-11 NOTE — Discharge Instructions (Signed)
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
## 2015-09-16 ENCOUNTER — Encounter: Payer: Self-pay | Admitting: Obstetrics and Gynecology

## 2015-09-16 ENCOUNTER — Ambulatory Visit (INDEPENDENT_AMBULATORY_CARE_PROVIDER_SITE_OTHER): Payer: Medicaid Other | Admitting: Obstetrics and Gynecology

## 2015-09-16 VITALS — BP 90/60 | HR 89 | Wt 138.0 lb

## 2015-09-16 DIAGNOSIS — Z331 Pregnant state, incidental: Secondary | ICD-10-CM

## 2015-09-16 DIAGNOSIS — Z3A24 24 weeks gestation of pregnancy: Secondary | ICD-10-CM

## 2015-09-16 DIAGNOSIS — O99322 Drug use complicating pregnancy, second trimester: Secondary | ICD-10-CM | POA: Diagnosis not present

## 2015-09-16 DIAGNOSIS — Z1389 Encounter for screening for other disorder: Secondary | ICD-10-CM | POA: Diagnosis not present

## 2015-09-16 DIAGNOSIS — O99612 Diseases of the digestive system complicating pregnancy, second trimester: Secondary | ICD-10-CM | POA: Diagnosis not present

## 2015-09-16 DIAGNOSIS — O0992 Supervision of high risk pregnancy, unspecified, second trimester: Secondary | ICD-10-CM | POA: Diagnosis not present

## 2015-09-16 LAB — POCT URINALYSIS DIPSTICK
GLUCOSE UA: NEGATIVE
KETONES UA: NEGATIVE
Leukocytes, UA: NEGATIVE
Nitrite, UA: NEGATIVE
Protein, UA: NEGATIVE
RBC UA: NEGATIVE

## 2015-09-16 MED ORDER — PROMETHAZINE HCL 25 MG PO TABS: 25.0000 mg | ORAL_TABLET | Freq: Four times a day (QID) | ORAL | Status: DC | PRN

## 2015-09-16 MED ORDER — OXYCODONE HCL 10 MG PO TABS: 10.0000 mg | ORAL_TABLET | Freq: Four times a day (QID) | ORAL | Status: DC

## 2015-09-16 NOTE — Progress Notes (Signed)
Pt denies any problems or concerns at this time.  
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
## 2015-09-26 ENCOUNTER — Other Ambulatory Visit: Payer: Self-pay | Admitting: Obstetrics and Gynecology

## 2015-09-26 ENCOUNTER — Encounter (HOSPITAL_COMMUNITY): Payer: Medicaid Other

## 2015-09-26 ENCOUNTER — Emergency Department (HOSPITAL_COMMUNITY)
Admission: EM | Admit: 2015-09-26 | Discharge: 2015-09-26 | Disposition: A | Discharge status: Other Acute Inpatient Hospital | Payer: Medicaid Other | Source: Home / Self Care | Attending: Emergency Medicine | Admitting: Emergency Medicine

## 2015-09-26 ENCOUNTER — Inpatient Hospital Stay (HOSPITAL_COMMUNITY)
Admission: AD | Admit: 2015-09-26 | Discharge: 2015-09-29 | DRG: 781 | Disposition: A | Discharge status: Home / Self Care | Payer: Medicaid Other | Source: Ambulatory Visit | Attending: Family Medicine | Admitting: Family Medicine

## 2015-09-26 ENCOUNTER — Encounter (HOSPITAL_COMMUNITY): Payer: Self-pay | Admitting: Emergency Medicine

## 2015-09-26 ENCOUNTER — Encounter (HOSPITAL_COMMUNITY)
Admission: RE | Admit: 2015-09-26 | Discharge: 2015-09-26 | Disposition: A | Discharge status: Home / Self Care | Payer: Medicaid Other | Source: Ambulatory Visit | Attending: Obstetrics and Gynecology | Admitting: Obstetrics and Gynecology

## 2015-09-26 ENCOUNTER — Encounter (HOSPITAL_COMMUNITY): Payer: Self-pay

## 2015-09-26 DIAGNOSIS — O99613 Diseases of the digestive system complicating pregnancy, third trimester: Secondary | ICD-10-CM | POA: Diagnosis not present

## 2015-09-26 DIAGNOSIS — K859 Acute pancreatitis without necrosis or infection, unspecified: Secondary | ICD-10-CM | POA: Diagnosis present

## 2015-09-26 DIAGNOSIS — Z3A27 27 weeks gestation of pregnancy: Secondary | ICD-10-CM | POA: Insufficient documentation

## 2015-09-26 DIAGNOSIS — O99612 Diseases of the digestive system complicating pregnancy, second trimester: Secondary | ICD-10-CM

## 2015-09-26 DIAGNOSIS — K219 Gastro-esophageal reflux disease without esophagitis: Secondary | ICD-10-CM | POA: Diagnosis present

## 2015-09-26 DIAGNOSIS — Z3A25 25 weeks gestation of pregnancy: Secondary | ICD-10-CM

## 2015-09-26 DIAGNOSIS — O99332 Smoking (tobacco) complicating pregnancy, second trimester: Secondary | ICD-10-CM

## 2015-09-26 DIAGNOSIS — O212 Late vomiting of pregnancy: Secondary | ICD-10-CM | POA: Diagnosis present

## 2015-09-26 DIAGNOSIS — Z833 Family history of diabetes mellitus: Secondary | ICD-10-CM | POA: Diagnosis not present

## 2015-09-26 DIAGNOSIS — F329 Major depressive disorder, single episode, unspecified: Secondary | ICD-10-CM

## 2015-09-26 DIAGNOSIS — Z79899 Other long term (current) drug therapy: Secondary | ICD-10-CM

## 2015-09-26 DIAGNOSIS — F17210 Nicotine dependence, cigarettes, uncomplicated: Secondary | ICD-10-CM | POA: Diagnosis present

## 2015-09-26 DIAGNOSIS — Z3A32 32 weeks gestation of pregnancy: Secondary | ICD-10-CM | POA: Diagnosis not present

## 2015-09-26 DIAGNOSIS — I10 Essential (primary) hypertension: Secondary | ICD-10-CM

## 2015-09-26 DIAGNOSIS — K858 Other acute pancreatitis without necrosis or infection: Secondary | ICD-10-CM | POA: Diagnosis not present

## 2015-09-26 DIAGNOSIS — O2342 Unspecified infection of urinary tract in pregnancy, second trimester: Secondary | ICD-10-CM | POA: Diagnosis present

## 2015-09-26 DIAGNOSIS — Z825 Family history of asthma and other chronic lower respiratory diseases: Secondary | ICD-10-CM

## 2015-09-26 DIAGNOSIS — O09213 Supervision of pregnancy with history of pre-term labor, third trimester: Secondary | ICD-10-CM | POA: Diagnosis not present

## 2015-09-26 DIAGNOSIS — Z8249 Family history of ischemic heart disease and other diseases of the circulatory system: Secondary | ICD-10-CM | POA: Diagnosis not present

## 2015-09-26 DIAGNOSIS — Z79891 Long term (current) use of opiate analgesic: Secondary | ICD-10-CM

## 2015-09-26 LAB — COMPREHENSIVE METABOLIC PANEL
ALT: 9 U/L — ABNORMAL LOW (ref 14–54)
AST: 14 U/L — ABNORMAL LOW (ref 15–41)
Albumin: 3.3 g/dL — ABNORMAL LOW (ref 3.5–5.0)
Alkaline Phosphatase: 80 U/L (ref 38–126)
Anion gap: 7 (ref 5–15)
BUN: 7 mg/dL (ref 6–20)
CHLORIDE: 104 mmol/L (ref 101–111)
CO2: 20 mmol/L — AB (ref 22–32)
CREATININE: 0.44 mg/dL (ref 0.44–1.00)
Calcium: 8.6 mg/dL — ABNORMAL LOW (ref 8.9–10.3)
Glucose, Bld: 99 mg/dL (ref 65–99)
POTASSIUM: 3.9 mmol/L (ref 3.5–5.1)
SODIUM: 131 mmol/L — AB (ref 135–145)
Total Bilirubin: 0.1 mg/dL — ABNORMAL LOW (ref 0.3–1.2)
Total Protein: 7.2 g/dL (ref 6.5–8.1)

## 2015-09-26 LAB — CBC WITH DIFFERENTIAL/PLATELET
Basophils Absolute: 0 10*3/uL (ref 0.0–0.1)
Basophils Relative: 0 %
EOS ABS: 0.3 10*3/uL (ref 0.0–0.7)
EOS PCT: 2 %
HCT: 32.3 % — ABNORMAL LOW (ref 36.0–46.0)
Hemoglobin: 11 g/dL — ABNORMAL LOW (ref 12.0–15.0)
LYMPHS ABS: 2.4 10*3/uL (ref 0.7–4.0)
LYMPHS PCT: 16 %
MCH: 30.4 pg (ref 26.0–34.0)
MCHC: 34.1 g/dL (ref 30.0–36.0)
MCV: 89.2 fL (ref 78.0–100.0)
MONO ABS: 1.2 10*3/uL — AB (ref 0.1–1.0)
Monocytes Relative: 8 %
Neutro Abs: 10.9 10*3/uL — ABNORMAL HIGH (ref 1.7–7.7)
Neutrophils Relative %: 74 %
PLATELETS: 277 10*3/uL (ref 150–400)
RBC: 3.62 MIL/uL — ABNORMAL LOW (ref 3.87–5.11)
RDW: 14.2 % (ref 11.5–15.5)
WBC: 14.8 10*3/uL — ABNORMAL HIGH (ref 4.0–10.5)

## 2015-09-26 LAB — URINALYSIS, ROUTINE W REFLEX MICROSCOPIC
Bilirubin Urine: NEGATIVE
Glucose, UA: 100 mg/dL — AB
HGB URINE DIPSTICK: NEGATIVE
Ketones, ur: NEGATIVE mg/dL
Nitrite: NEGATIVE
PROTEIN: NEGATIVE mg/dL
pH: 6.5 (ref 5.0–8.0)

## 2015-09-26 LAB — URINE MICROSCOPIC-ADD ON: RBC / HPF: NONE SEEN RBC/hpf (ref 0–5)

## 2015-09-26 LAB — LIPASE, BLOOD: LIPASE: 775 U/L — AB (ref 11–51)

## 2015-09-26 MED ORDER — FAMOTIDINE IN NACL 20-0.9 MG/50ML-% IV SOLN
20.0000 mg | Freq: Two times a day (BID) | INTRAVENOUS | Status: DC
  Administered 2015-09-26 – 2015-09-29 (×6): 20 mg via INTRAVENOUS
  Filled 2015-09-26 (×7): qty 50

## 2015-09-26 MED ORDER — DOCUSATE SODIUM 100 MG PO CAPS
100.0000 mg | ORAL_CAPSULE | Freq: Every day | ORAL | Status: DC
  Administered 2015-09-27 – 2015-09-28 (×2): 100 mg via ORAL
  Filled 2015-09-26 (×2): qty 1

## 2015-09-26 MED ORDER — PRENATAL MULTIVITAMIN CH
1.0000 | ORAL_TABLET | Freq: Every day | ORAL | Status: DC
  Administered 2015-09-27 – 2015-09-28 (×2): 1 via ORAL
  Filled 2015-09-26 (×2): qty 1

## 2015-09-26 MED ORDER — SODIUM CHLORIDE 0.9 % IV BOLUS (SEPSIS)
1000.0000 mL | Freq: Once | INTRAVENOUS | Status: AC
  Administered 2015-09-26: 1000 mL via INTRAVENOUS

## 2015-09-26 MED ORDER — ZOLPIDEM TARTRATE 5 MG PO TABS
5.0000 mg | ORAL_TABLET | Freq: Every evening | ORAL | Status: DC | PRN
  Administered 2015-09-28 (×2): 5 mg via ORAL
  Filled 2015-09-26 (×2): qty 1

## 2015-09-26 MED ORDER — HYDROMORPHONE HCL 2 MG PO TABS
2.0000 mg | ORAL_TABLET | ORAL | Status: DC | PRN
  Filled 2015-09-26: qty 1

## 2015-09-26 MED ORDER — KETOROLAC TROMETHAMINE 30 MG/ML IJ SOLN
30.0000 mg | Freq: Four times a day (QID) | INTRAMUSCULAR | Status: AC
Start: 2015-09-26 — End: 2015-09-27
  Administered 2015-09-26 – 2015-09-27 (×4): 30 mg via INTRAVENOUS
  Filled 2015-09-26 (×8): qty 1

## 2015-09-26 MED ORDER — HYDROMORPHONE HCL 1 MG/ML IJ SOLN
1.0000 mg | Freq: Once | INTRAMUSCULAR | Status: AC
  Administered 2015-09-26: 1 mg via INTRAVENOUS
  Filled 2015-09-26: qty 1

## 2015-09-26 MED ORDER — ACETAMINOPHEN 325 MG PO TABS: 650.0000 mg | ORAL_TABLET | ORAL | Status: DC | PRN

## 2015-09-26 MED ORDER — PROMETHAZINE HCL 25 MG/ML IJ SOLN
12.5000 mg | INTRAMUSCULAR | Status: DC | PRN
Start: 2015-09-26 — End: 2015-09-29
  Administered 2015-09-26 – 2015-09-27 (×8): 12.5 mg via INTRAVENOUS
  Filled 2015-09-26 (×9): qty 1

## 2015-09-26 MED ORDER — ONDANSETRON HCL 4 MG/2ML IJ SOLN
4.0000 mg | Freq: Once | INTRAMUSCULAR | Status: AC
  Administered 2015-09-26: 4 mg via INTRAVENOUS
  Filled 2015-09-26: qty 2

## 2015-09-26 MED ORDER — PANTOPRAZOLE SODIUM 40 MG PO TBEC: 40.0000 mg | DELAYED_RELEASE_TABLET | Freq: Every day | ORAL | Status: DC

## 2015-09-26 MED ORDER — CALCIUM CARBONATE ANTACID 500 MG PO CHEW: 2.0000 | CHEWABLE_TABLET | ORAL | Status: DC | PRN

## 2015-09-26 MED ORDER — SERTRALINE HCL 50 MG PO TABS
50.0000 mg | ORAL_TABLET | Freq: Every day | ORAL | Status: DC
  Filled 2015-09-26 (×2): qty 1

## 2015-09-26 MED ORDER — DEXTROSE-NACL 5-0.9 % IV SOLN: INTRAVENOUS | Status: DC

## 2015-09-26 MED ORDER — DEXTROSE-NACL 5-0.45 % IV SOLN
INTRAVENOUS | Status: DC
  Administered 2015-09-26: 11:00:00 via INTRAVENOUS

## 2015-09-26 MED ORDER — SODIUM CHLORIDE 0.9 % IV SOLN
8.0000 mg | Freq: Four times a day (QID) | INTRAVENOUS | Status: DC | PRN
  Administered 2015-09-26: 8 mg via INTRAVENOUS
  Filled 2015-09-26 (×2): qty 4

## 2015-09-26 MED ORDER — PROMETHAZINE HCL 25 MG PO TABS
25.0000 mg | ORAL_TABLET | ORAL | Status: DC | PRN
  Administered 2015-09-27: 25 mg via ORAL
  Filled 2015-09-26: qty 1

## 2015-09-26 MED ORDER — LACTATED RINGERS IV SOLN
INTRAVENOUS | Status: AC
  Administered 2015-09-26 – 2015-09-27 (×4): via INTRAVENOUS

## 2015-09-26 MED ORDER — DEXTROSE 5 % IV SOLN
1.0000 g | Freq: Once | INTRAVENOUS | Status: AC
  Administered 2015-09-26: 1 g via INTRAVENOUS
  Filled 2015-09-26: qty 10

## 2015-09-26 MED ORDER — PROMETHAZINE HCL 25 MG RE SUPP: 25.0000 mg | RECTAL | Status: DC | PRN

## 2015-09-26 MED ORDER — HYDROMORPHONE HCL 2 MG/ML IJ SOLN
2.0000 mg | INTRAMUSCULAR | Status: DC | PRN
Start: 2015-09-26 — End: 2015-09-27
  Administered 2015-09-26 – 2015-09-27 (×8): 2 mg via INTRAVENOUS
  Filled 2015-09-26 (×8): qty 1

## 2015-09-26 NOTE — MAU Note (Signed)
Pt to be direct admitted to room 157.
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
## 2015-09-26 NOTE — ED Notes (Signed)
RN spoke with El[facility]ita Maxwellheryl [facility].

## 2015-09-26 NOTE — ED Notes (Signed)
Pt requesting pain medication. Per Dr Erin HearingMessner may repeat zofran and dilaudid [facility]
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
## 2015-09-26 NOTE — ED Notes (Signed)
Per Dr Rosemary HolmsFerguson Wrap IV site with kling for POV transfer
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
## 2015-09-26 NOTE — ED Notes (Signed)
Rapid response for monitoring notified

## 2015-09-26 NOTE — ED Notes (Signed)
Pt reports sharp, shooting, upper abdominal pain that starts in the middle and radiates to LT ribcage x 1 day with n/v. Hx of pancreatitis. Pt is [redacted] weeks pregnant. Denies any vaginal bleeding.

## 2015-09-26 NOTE — ED Notes (Signed)
[facility]

## 2015-09-26 NOTE — Progress Notes (Addendum)
Called from Veterinary surgeonrobin RN at [facility], pt is 25 weeks G3P1, comes in with hx of panceatitis and has upper abdomen pain. Placed on FHR monitor, tracing reactive and reassuring, no uc's noted. Called Dr Debroah Looparnold informed of pt status, Ok to come off monitor. OB cleared. [facility] pt may come off monitor.

## 2015-09-26 NOTE — ED Notes (Signed)
Per Christine-Rapid Response [facility] at [facility], fetal heart rate tracing looks good, no contractions, [facility].

## 2015-09-27 LAB — COMPREHENSIVE METABOLIC PANEL
ALK PHOS: 66 U/L (ref 38–126)
ALT: 7 U/L — AB (ref 14–54)
ANION GAP: 4 — AB (ref 5–15)
AST: 11 U/L — ABNORMAL LOW (ref 15–41)
Albumin: 2.5 g/dL — ABNORMAL LOW (ref 3.5–5.0)
BUN: <5 mg/dL — ABNORMAL LOW (ref 6–20)
CALCIUM: 8.3 mg/dL — AB (ref 8.9–10.3)
CO2: 26 mmol/L (ref 22–32)
CREATININE: 0.37 mg/dL — AB (ref 0.44–1.00)
Chloride: 106 mmol/L (ref 101–111)
Glucose, Bld: 107 mg/dL — ABNORMAL HIGH (ref 65–99)
Potassium: 4 mmol/L (ref 3.5–5.1)
SODIUM: 136 mmol/L (ref 135–145)
TOTAL PROTEIN: 5.2 g/dL — AB (ref 6.5–8.1)
Total Bilirubin: 0.4 mg/dL (ref 0.3–1.2)

## 2015-09-27 LAB — LIPASE, BLOOD
LIPASE: 148 U/L — AB (ref 11–51)
LIPASE: 120 U/L — AB (ref 11–51)

## 2015-09-27 MED ORDER — DIPHENHYDRAMINE HCL 25 MG PO CAPS: 12.5000 mg | ORAL_CAPSULE | Freq: Four times a day (QID) | ORAL | Status: DC | PRN

## 2015-09-27 MED ORDER — HYDROMORPHONE HCL 1 MG/ML IJ SOLN
1.0000 mg | INTRAMUSCULAR | Status: DC | PRN
Start: 2015-09-27 — End: 2015-09-27
  Administered 2015-09-27: 1 mg via INTRAVENOUS
  Filled 2015-09-27: qty 1

## 2015-09-27 MED ORDER — DIPHENHYDRAMINE HCL 25 MG PO CAPS
25.0000 mg | ORAL_CAPSULE | Freq: Four times a day (QID) | ORAL | Status: DC | PRN
Start: 2015-09-27 — End: 2015-09-29
  Administered 2015-09-27 – 2015-09-28 (×3): 25 mg via ORAL
  Filled 2015-09-27 (×3): qty 1

## 2015-09-27 MED ORDER — OXYCODONE HCL 5 MG PO TABS
10.0000 mg | ORAL_TABLET | ORAL | Status: DC | PRN
  Administered 2015-09-27 – 2015-09-29 (×8): 10 mg via ORAL
  Filled 2015-09-27 (×8): qty 2

## 2015-09-27 MED ORDER — HYDROMORPHONE HCL 1 MG/ML IJ SOLN
1.0000 mg | INTRAMUSCULAR | Status: DC | PRN
  Administered 2015-09-27 – 2015-09-28 (×3): 1 mg via INTRAVENOUS
  Filled 2015-09-27 (×3): qty 1

## 2015-09-27 MED ORDER — SERTRALINE HCL 50 MG PO TABS
50.0000 mg | ORAL_TABLET | Freq: Every day | ORAL | Status: DC
  Administered 2015-09-28: 50 mg via ORAL
  Filled 2015-09-27 (×2): qty 1

## 2015-09-27 NOTE — Progress Notes (Signed)
FACULTY PRACTICE ANTEPARTUM(COMPREHENSIVE) NOTE  [name] is a 30 y.o. G3P0201 at 8280w1d by LMP who is admitted for pancreatitis, chronic .   Fetal presentation is unsure. Length of Stay:  1  Days  Subjective:nausea, pain is less frequent  Patient reports the fetal movement as active. Patient reports uterine contraction  activity as none. Patient reports  vaginal bleeding as none. Patient describes fluid per vagina as None.  Vitals:  Blood pressure 133/78, pulse 99, temperature 98.2 F (36.8 C), temperature source Oral, resp. rate 18, height 5\' 4"  (1.626 m), weight 62.596 kg (138 lb), last menstrual period 04/04/2015, unknown if currently breastfeeding. Physical Examination:  General appearance - alert, well appearing, and in no distress Heart - normal rate and regular rhythm Abdomen - soft, nontender, nondistended Fundal Height:  size equals dates Cervical Exam: Not evaluated. Extremities: extremities normal  Membranes:intact  Fetal Monitoring:  Fetal Heart Rate A      Mode  Doppler filed at 09/27/2015 0027    Baseline Rate (A)  153 bpm filed at 09/27/2015 0027    Variability  6-25 BPM filed at 09/26/2015 2204    Accelerations  15 x 15 filed at 09/26/2015 2204    Decelerations  None filed at 09/26/2015 2204      Labs:  Results for orders placed or performed during the hospital encounter of 09/26/15 (from the past 24 hour(s))  Comprehensive metabolic panel   Collection Time: 09/27/15  6:05 AM  Result Value Ref Range   Sodium 136 135 - 145 mmol/L   Potassium 4.0 3.5 - 5.1 mmol/L   Chloride 106 101 - 111 mmol/L   CO2 26 22 - 32 mmol/L   Glucose, Bld 107 (H) 65 - 99 mg/dL   BUN <5 (L) 6 - 20 mg/dL   Creatinine, Ser 1.610.37 (L) 0.44 - 1.00 mg/dL   Calcium 8.3 (L) 8.9 - 10.3 mg/dL   Total Protein 5.2 (L) 6.5 - 8.1 g/dL   Albumin 2.5 (L) 3.5 - 5.0 g/dL   AST 11 (L) 15 - 41 U/L   ALT 7 (L) 14 - 54 U/L   Alkaline Phosphatase 66 38 - 126 U/L   Total Bilirubin  0.4 0.3 - 1.2 mg/dL   GFR calc non Af Amer >60 >60 mL/min   GFR calc Af Amer >60 >60 mL/min   Anion gap 4 (L) 5 - 15  Lipase, blood   Collection Time: 09/27/15  6:05 AM  Result Value Ref Range   Lipase 148 (H) 11 - 51 U/L  Results for orders placed or performed during the hospital encounter of 09/26/15 (from the past 24 hour(s))  CBC with Differential   Collection Time: 09/26/15  7:42 AM  Result Value Ref Range   WBC 14.8 (H) 4.0 - 10.5 K/uL   RBC 3.62 (L) 3.87 - 5.11 MIL/uL   Hemoglobin 11.0 (L) 12.0 - 15.0 g/dL   HCT 09.632.3 (L) 04.536.0 - 40.946.0 %   MCV 89.2 78.0 - 100.0 fL   MCH 30.4 26.0 - 34.0 pg   MCHC 34.1 30.0 - 36.0 g/dL   RDW 81.114.2 91.411.5 - 78.215.5 %   Platelets 277 150 - 400 K/uL   Neutrophils Relative % 74 %   Neutro Abs 10.9 (H) 1.7 - 7.7 K/uL   Lymphocytes Relative 16 %   Lymphs Abs 2.4 0.7 - 4.0 K/uL   Monocytes Relative 8 %   Monocytes Absolute 1.2 (H) 0.1 - 1.0 K/uL   Eosinophils Relative 2 %  Eosinophils Absolute 0.3 0.0 - 0.7 K/uL   Basophils Relative 0 %   Basophils Absolute 0.0 0.0 - 0.1 K/uL  Comprehensive metabolic panel   Collection Time: 09/26/15  7:42 AM  Result Value Ref Range   Sodium 131 (L) 135 - 145 mmol/L   Potassium 3.9 3.5 - 5.1 mmol/L   Chloride 104 101 - 111 mmol/L   CO2 20 (L) 22 - 32 mmol/L   Glucose, Bld 99 65 - 99 mg/dL   BUN 7 6 - 20 mg/dL   Creatinine, Ser 1.61 0.44 - 1.00 mg/dL   Calcium 8.6 (L) 8.9 - 10.3 mg/dL   Total Protein 7.2 6.5 - 8.1 g/dL   Albumin 3.3 (L) 3.5 - 5.0 g/dL   AST 14 (L) 15 - 41 U/L   ALT 9 (L) 14 - 54 U/L   Alkaline Phosphatase 80 38 - 126 U/L   Total Bilirubin 0.1 (L) 0.3 - 1.2 mg/dL   GFR calc non Af Amer >60 >60 mL/min   GFR calc Af Amer >60 >60 mL/min   Anion gap 7 5 - 15  Lipase, blood   Collection Time: 09/26/15  7:42 AM  Result Value Ref Range   Lipase 775 (H) 11 - 51 U/L  Urinalysis, Routine w reflex microscopic (not at [facility])   Collection Time: 09/26/15  9:14 AM  Result Value Ref Range   Color, Urine  YELLOW YELLOW   APPearance CLEAR CLEAR   Specific Gravity, Urine <1.005 (L) 1.005 - 1.030   pH 6.5 5.0 - 8.0   Glucose, UA 100 (A) NEGATIVE mg/dL   Hgb urine dipstick NEGATIVE NEGATIVE   Bilirubin Urine NEGATIVE NEGATIVE   Ketones, ur NEGATIVE NEGATIVE mg/dL   Protein, ur NEGATIVE NEGATIVE mg/dL   Nitrite NEGATIVE NEGATIVE   Leukocytes, UA MODERATE (A) NEGATIVE  Urine microscopic-add on   Collection Time: 09/26/15  9:14 AM  Result Value Ref Range   Squamous Epithelial / LPF 0-5 (A) NONE SEEN   WBC, UA TOO NUMEROUS TO COUNT 0 - 5 WBC/hpf   RBC / HPF NONE SEEN 0 - 5 RBC/hpf   Bacteria, UA MANY (A) NONE SEEN    Imaging Studies:     Currently EPIC will not allow sonographic studies to automatically populate into notes.  In the meantime, copy and paste results into note or free text.  Medications:  Scheduled . docusate sodium  100 mg Oral Daily  . famotidine (PEPCID) IV  20 mg Intravenous Q12H  . ketorolac  30 mg Intravenous Q6H  . prenatal multivitamin  1 tablet Oral Q1200   I have reviewed the patient's current medications.  ASSESSMENT: Patient Active Problem List   Diagnosis Date Noted  . Pancreatitis, acute 09/26/2015  . Hyperemesis gravidarum 06/01/2015  . ASCUS with positive high risk HPV cervical 05/24/2015  . Supervision of normal pregnancy 05/19/2015  . History of preterm delivery, currently pregnant 05/05/2015  . Preterm delivery 10/14/2014  . Depression with anxiety 10/14/2014  . Placental abruption, with delivery 08/04/2014  . BV (bacterial vaginosis) 07/24/2014  . Chronic relapsing pancreatitis (HCC) 07/04/2014  . Prior pregnancy with fetal demise, antepartum 06/25/2014  . Pap smear abnormality of cervix with ASCUS favoring dysplasia 04/08/2014  . Rh negative state in antepartum period 02/13/2014  . Marijuana use 02/13/2014  . Hematemesis 11/04/2012  . Sleep disturbance 08/01/2012  . Generalized anxiety disorder 08/01/2012  . Opiate dependence (HCC)  02/27/2012  . Tobacco abuse 04/13/2011  . Pancreas divisum 07/23/2010  .  Anemia 07/23/2010    PLAN: symptoms improving and lipase is much decreased, consider clear liquids today  ARNOLD,JAMES 09/27/2015,7:31 AM

## 2015-09-28 DIAGNOSIS — Z3A32 32 weeks gestation of pregnancy: Secondary | ICD-10-CM

## 2015-09-28 DIAGNOSIS — O99613 Diseases of the digestive system complicating pregnancy, third trimester: Secondary | ICD-10-CM

## 2015-09-28 DIAGNOSIS — O09293 Supervision of pregnancy with other poor reproductive or obstetric history, third trimester: Secondary | ICD-10-CM

## 2015-09-28 DIAGNOSIS — O09213 Supervision of pregnancy with history of pre-term labor, third trimester: Secondary | ICD-10-CM

## 2015-09-28 LAB — COMPREHENSIVE METABOLIC PANEL
ALBUMIN: 2.7 g/dL — AB (ref 3.5–5.0)
ALK PHOS: 68 U/L (ref 38–126)
ALT: 9 U/L — ABNORMAL LOW (ref 14–54)
ANION GAP: 7 (ref 5–15)
AST: 11 U/L — ABNORMAL LOW (ref 15–41)
BUN: <5 mg/dL — ABNORMAL LOW (ref 6–20)
CALCIUM: 8.5 mg/dL — AB (ref 8.9–10.3)
CO2: 23 mmol/L (ref 22–32)
Chloride: 104 mmol/L (ref 101–111)
Creatinine, Ser: 0.39 mg/dL — ABNORMAL LOW (ref 0.44–1.00)
GFR calc non Af Amer: >60 mL/min (ref >60)
GLUCOSE: 85 mg/dL (ref 65–99)
POTASSIUM: 3.5 mmol/L (ref 3.5–5.1)
SODIUM: 134 mmol/L — AB (ref 135–145)
TOTAL PROTEIN: 6 g/dL — AB (ref 6.5–8.1)
Total Bilirubin: 0.3 mg/dL (ref 0.3–1.2)

## 2015-09-28 LAB — LIPASE, BLOOD: Lipase: 73 U/L — ABNORMAL HIGH (ref 11–51)

## 2015-09-28 MED ORDER — ONDANSETRON HCL 4 MG/2ML IJ SOLN
4.0000 mg | Freq: Four times a day (QID) | INTRAMUSCULAR | Status: DC | PRN
  Administered 2015-09-28 (×3): 4 mg via INTRAVENOUS
  Filled 2015-09-28 (×3): qty 2

## 2015-09-28 MED ORDER — SODIUM CHLORIDE 0.9 % IV SOLN: INTRAVENOUS | Status: DC

## 2015-09-28 NOTE — Progress Notes (Signed)
FACULTY PRACTICE ANTEPARTUM(COMPREHENSIVE) NOTE  [name] is a 30 y.o. G3P0201 at 7539w1d by LMP who is admitted for pancreatitis, chronic .   Fetal presentation is unsure. Length of Stay:  2  Days  Subjective: Patient reports improvement of pain, but still tender.  Tolerating clears Patient reports the fetal movement as active. Patient reports uterine contraction  activity as none. Patient reports  vaginal bleeding as none. Patient describes fluid per vagina as None.  Vitals:  Blood pressure 131/85, pulse 102, temperature 98 F (36.7 C), temperature source Oral, resp. rate 20, height 5\' 4"  (1.626 m), weight 138 lb (62.596 kg), last menstrual period 04/04/2015, unknown if currently breastfeeding. Physical Examination:  General appearance - alert, well appearing, and in no distress Heart - normal rate and regular rhythm Lungs - CTA Abdomen - soft, mild tender, nondistended Fundal Height:  size equals dates Cervical Exam: Not evaluated. Extremities: extremities normal  Membranes:intact  Fetal Monitoring:  140s, mod variability, + accel, no decel   Labs:  Results for orders placed or performed during the hospital encounter of 09/26/15 (from the past 24 hour(s))  Lipase, blood   Collection Time: 09/27/15  8:12 PM  Result Value Ref Range   Lipase 120 (H) 11 - 51 U/L  Comprehensive metabolic panel   Collection Time: 09/28/15  5:27 AM  Result Value Ref Range   Sodium 134 (L) 135 - 145 mmol/L   Potassium 3.5 3.5 - 5.1 mmol/L   Chloride 104 101 - 111 mmol/L   CO2 23 22 - 32 mmol/L   Glucose, Bld 85 65 - 99 mg/dL   BUN <5 (L) 6 - 20 mg/dL   Creatinine, Ser 1.470.39 (L) 0.44 - 1.00 mg/dL   Calcium 8.5 (L) 8.9 - 10.3 mg/dL   Total Protein 6.0 (L) 6.5 - 8.1 g/dL   Albumin 2.7 (L) 3.5 - 5.0 g/dL   AST 11 (L) 15 - 41 U/L   ALT 9 (L) 14 - 54 U/L   Alkaline Phosphatase 68 38 - 126 U/L   Total Bilirubin 0.3 0.3 - 1.2 mg/dL   GFR calc non Af Amer >60 >60 mL/min   GFR calc Af Amer  >60 >60 mL/min   Anion gap 7 5 - 15  Lipase, blood   Collection Time: 09/28/15  5:27 AM  Result Value Ref Range   Lipase 73 (H) 11 - 51 U/L    Imaging Studies:     Currently EPIC will not allow sonographic studies to automatically populate into notes.  In the meantime, copy and paste results into note or free text.  Medications:  Scheduled . docusate sodium  100 mg Oral Daily  . famotidine (PEPCID) IV  20 mg Intravenous Q12H  . prenatal multivitamin  1 tablet Oral Q1200  . sertraline  50 mg Oral Daily   I have reviewed the patient's current medications.  ASSESSMENT: Patient Active Problem List   Diagnosis Date Noted  . Pancreatitis, acute 09/26/2015  . Hyperemesis gravidarum 06/01/2015  . ASCUS with positive high risk HPV cervical 05/24/2015  . Supervision of normal pregnancy 05/19/2015  . History of preterm delivery, currently pregnant 05/05/2015  . Preterm delivery 10/14/2014  . Depression with anxiety 10/14/2014  . Placental abruption, with delivery 08/04/2014  . BV (bacterial vaginosis) 07/24/2014  . Chronic relapsing pancreatitis (HCC) 07/04/2014  . Prior pregnancy with fetal demise, antepartum 06/25/2014  . Pap smear abnormality of cervix with ASCUS favoring dysplasia 04/08/2014  . Rh negative state in antepartum  period 02/13/2014  . Marijuana use 02/13/2014  . Hematemesis 11/04/2012  . Sleep disturbance 08/01/2012  . Generalized anxiety disorder 08/01/2012  . Opiate dependence (HCC) 02/27/2012  . Tobacco abuse 04/13/2011  . Pancreas divisum 07/23/2010  . Anemia 07/23/2010    PLAN:  1.  Acute on Chronic pancreatisis  Fluids to [facility]  Advance diet as tolerated  Transition to oral pain medicine  Recheck lipase and bmp in AM  [name] 09/28/2015,7:36 AM

## 2015-09-28 NOTE — Progress Notes (Signed)
Rounding and pain reassessment charted by Rolene Arbouresmaine Lewis, RN

## 2015-09-28 NOTE — Progress Notes (Signed)
Pt in wheelchair to go off unit.

## 2015-09-29 ENCOUNTER — Telehealth: Payer: Self-pay | Admitting: *Deleted

## 2015-09-29 LAB — COMPREHENSIVE METABOLIC PANEL
ALT: 9 U/L — ABNORMAL LOW (ref 14–54)
AST: 14 U/L — ABNORMAL LOW (ref 15–41)
Albumin: 2.6 g/dL — ABNORMAL LOW (ref 3.5–5.0)
Alkaline Phosphatase: 66 U/L (ref 38–126)
Anion gap: 6 (ref 5–15)
BUN: 6 mg/dL (ref 6–20)
CO2: 20 mmol/L — ABNORMAL LOW (ref 22–32)
Calcium: 8.5 mg/dL — ABNORMAL LOW (ref 8.9–10.3)
Chloride: 105 mmol/L (ref 101–111)
Creatinine, Ser: 0.41 mg/dL — ABNORMAL LOW (ref 0.44–1.00)
GFR calc Af Amer: >60 mL/min (ref >60)
GFR calc non Af Amer: >60 mL/min (ref >60)
Glucose, Bld: 105 mg/dL — ABNORMAL HIGH (ref 65–99)
Potassium: 3.8 mmol/L (ref 3.5–5.1)
Sodium: 131 mmol/L — ABNORMAL LOW (ref 135–145)
Total Bilirubin: 0.2 mg/dL — ABNORMAL LOW (ref 0.3–1.2)
Total Protein: 5.6 g/dL — ABNORMAL LOW (ref 6.5–8.1)

## 2015-09-29 LAB — LIPASE, BLOOD: Lipase: 51 U/L (ref 11–51)

## 2015-09-29 LAB — URINE CULTURE

## 2015-09-29 MED ORDER — OXYCODONE HCL 10 MG PO TABS: 10.0000 mg | ORAL_TABLET | ORAL | Status: DC | PRN

## 2015-09-29 MED ORDER — ONDANSETRON 8 MG PO TBDP: 8.0000 mg | ORAL_TABLET | Freq: Three times a day (TID) | ORAL | Status: DC | PRN

## 2015-09-29 MED ORDER — CEPHALEXIN 500 MG PO CAPS: 500.0000 mg | ORAL_CAPSULE | Freq: Four times a day (QID) | ORAL | Status: DC

## 2015-09-29 NOTE — Telephone Encounter (Signed)
[facility] Pharmacist called and stated pt is there with a Rx for Oxycodone 10 mg #6 09/29/2015 Peggy Constant, Dr. Emelda FearFerguson had given pt Rx for Oxycodone 10 mg #66 09/16/2015. Is it ok to fill the Oxycodone 10 mg #6. Per Dr.Ferguson ok to fill.

## 2015-09-29 NOTE — Discharge Instructions (Signed)
Preterm Labor Information Preterm labor is when labor starts at less than 37 weeks of pregnancy. The normal length of a pregnancy is 39 to 41 weeks. CAUSES Often, there is no identifiable underlying cause as to why a woman goes into preterm labor. One of the most common known causes of preterm labor is infection. Infections of the uterus, cervix, vagina, amniotic sac, bladder, kidney, or even the lungs (pneumonia) can cause labor to start. Other suspected causes of preterm labor include:   Urogenital infections, such as yeast infections and bacterial vaginosis.   Uterine abnormalities (uterine shape, uterine septum, fibroids, or bleeding from the placenta).   A cervix that has been operated on (it may fail to stay closed).   Malformations in the fetus.   Multiple gestations (twins, triplets, and so on).   Breakage of the amniotic sac.  RISK FACTORS  Having a previous history of preterm labor.   Having premature rupture of membranes (PROM).   Having a placenta that covers the opening of the cervix (placenta previa).   Having a placenta that separates from the uterus (placental abruption).   Having a cervix that is too weak to hold the fetus in the uterus (incompetent cervix).   Having too much fluid in the amniotic sac (polyhydramnios).   Taking illegal drugs or smoking while pregnant.   Not gaining enough weight while pregnant.   Being younger than 47 and older than 28 years old.   Having a low socioeconomic status.   Being African American. SYMPTOMS Signs and symptoms of preterm labor include:   Menstrual-like cramps, abdominal pain, or back pain.  Uterine contractions that are regular, as frequent as six in an hour, regardless of their intensity (may be mild or painful).  Contractions that start on the top of the uterus and spread down to the lower abdomen and back.   A sense of increased pelvic pressure.   A watery or bloody mucus discharge that  comes from the vagina.  TREATMENT Depending on the length of the pregnancy and other circumstances, your health care provider may suggest bed rest. If necessary, there are medicines that can be given to stop contractions and to mature the fetal lungs. If labor happens before 34 weeks of pregnancy, a prolonged hospital stay may be recommended. Treatment depends on the condition of both you and the fetus.  WHAT SHOULD YOU DO IF YOU THINK YOU ARE IN PRETERM LABOR? Call your health care provider right away. You will need to go to the hospital to get checked immediately. HOW CAN YOU PREVENT PRETERM LABOR IN FUTURE PREGNANCIES? You should:   Stop smoking if you smoke.  Maintain healthy weight gain and avoid chemicals and drugs that are not necessary.  Be watchful for any type of infection.  Inform your health care provider if you have a known history of preterm labor.   This information is not intended to replace advice given to you by your health care provider. Make sure you discuss any questions you have with your health care provider.   Document Released: 06/26/2003 Document Revised: 12/06/2012 Document Reviewed: 05/08/2012 Elsevier Interactive Patient Education 2016 Elsevier Inc. Acute Pancreatitis Acute pancreatitis is a disease in which the pancreas becomes suddenly inflamed. The pancreas is a large gland located behind your stomach. The pancreas produces enzymes that help digest food. The pancreas also releases the hormones glucagon and insulin that help regulate blood sugar. Damage to the pancreas occurs when the digestive enzymes from the pancreas are activated  and begin attacking the pancreas before being released into the intestine. Most acute attacks last a couple of days and can cause serious complications. Some people become dehydrated and develop low blood pressure. In severe cases, bleeding into the pancreas can lead to shock and can be life-threatening. The lungs, heart, and  kidneys may fail. CAUSES  Pancreatitis can happen to anyone. In some cases, the cause is unknown. Most cases are caused by:  Alcohol abuse.  Gallstones. Other less common causes are:  Certain medicines.  Exposure to certain chemicals.  Infection.  Damage caused by an accident (trauma).  Abdominal surgery. SYMPTOMS   Pain in the upper abdomen that may radiate to the back.  Tenderness and swelling of the abdomen.  Nausea and vomiting. DIAGNOSIS  Your caregiver will perform a physical exam. Blood and stool tests may be done to confirm the diagnosis. Imaging tests may also be done, such as X-rays, CT scans, or an ultrasound of the abdomen. TREATMENT  Treatment usually requires a stay in the hospital. Treatment may include:  Pain medicine.  Fluid replacement through an intravenous line (IV).  Placing a tube in the stomach to remove stomach contents and control vomiting.  Not eating for 3 or 4 days. This gives your pancreas a rest, because enzymes are not being produced that can cause further damage.  Antibiotic medicines if your condition is caused by an infection.  Surgery of the pancreas or gallbladder. HOME CARE INSTRUCTIONS   Follow the diet advised by your caregiver. This may involve avoiding alcohol and decreasing the amount of fat in your diet.  Eat smaller, more frequent meals. This reduces the amount of digestive juices the pancreas produces.  Drink enough fluids to keep your urine clear or pale yellow.  Only take over-the-counter or prescription medicines as directed by your caregiver.  Avoid drinking alcohol if it caused your condition.  Do not smoke.  Get plenty of rest.  Check your blood sugar at home as directed by your caregiver.  Keep all follow-up appointments as directed by your caregiver. SEEK MEDICAL CARE IF:   You do not recover as quickly as expected.  You develop new or worsening symptoms.  You have persistent pain, weakness, or  nausea.  You recover and then have another episode of pain. SEEK IMMEDIATE MEDICAL CARE IF:   You are unable to eat or keep fluids down.  Your pain becomes severe.  You have a fever or persistent symptoms for more than 2 to 3 days.  You have a fever and your symptoms suddenly get worse.  Your skin or the white part of your eyes turn yellow (jaundice).  You develop vomiting.  You feel dizzy, or you faint.  Your blood sugar is high (over 300 mg/dL). MAKE SURE YOU:   Understand these instructions.  Will watch your condition.  Will get help right away if you are not doing well or get worse.   This information is not intended to replace advice given to you by your health care provider. Make sure you discuss any questions you have with your health care provider.   Document Released: 04/05/2005 Document Revised: 10/05/2011 Document Reviewed: 07/15/2011 Elsevier Interactive Patient Education [employer]2016 Elsevier Inc.

## 2015-09-29 NOTE — Discharge Summary (Signed)
OB Discharge Summary     Patient Name: [name] DOB: 1985/07/14 MRN: [id number]  Date of admission: 09/26/2015 Delivering MD: This patient has no babies on file.  Date of discharge: 09/29/2015  Admitting diagnosis: 27WKS, HYDRATION Intrauterine pregnancy: [redacted]w[redacted]d     Secondary diagnosis:  Active Problems:   Pancreatitis, acute  Additional problems: UTI     Discharge diagnosis: Resolving pancreatitis and UTI                                   Hospital course:  Patient with history of chronic pancreatitis admitted secondary to worsening epigastric pain associated with nausea and emesis. She was treated with IV hydration and management of her nausea and emesis with antiemetics. She remained NPO until her symptoms gradually improved. On HD # 3 she was found stable for discharge as she was tolerated oral intake and her lipase improved to 53. Patient is scheduled to follow up on 6/13 with [facility]. Discharge instructions were provided. Throughout this admission, fetal status remained reassuring  Physical exam  Filed Vitals:   09/28/15 2325 09/29/15 0131 09/29/15 0230 09/29/15 0532  BP:  123/80  109/70  Pulse:  102  89  Temp:  98 F (36.7 C)  97.7 F (36.5 C)  TempSrc:  Axillary  Oral  Resp: Height:      Weight:       General: alert, cooperative and no distress Abdomen: Soft, non tender, non distended DVT Evaluation: No evidence of DVT seen on physical exam. Negative Homan's sign. FHT: baseline 150, mod variability, + accels, no decels Toco: no contractions  Labs: Lab Results  Component Value Date   WBC 14.8* 09/26/2015   HGB 11.0* 09/26/2015   HCT 32.3* 09/26/2015   MCV 89.2 09/26/2015   PLT 277 09/26/2015   CMP Latest Ref Rng 09/29/2015  Glucose 65 - 99 mg/dL 409(W)  BUN 6 - 20 mg/dL 6  Creatinine 1.19 - 1.47 mg/dL 8.29(F)  Sodium 621 - 308 mmol/L 131(L)  Potassium 3.5 - 5.1 mmol/L 3.8  Chloride 101 - 111 mmol/L 105  CO2 22 - 32 mmol/L 20(L)   Calcium 8.9 - 10.3 mg/dL 6.5(H)  Total Protein 6.5 - 8.1 g/dL 8.4(O)  Total Bilirubin 0.3 - 1.2 mg/dL 9.6(E)  Alkaline Phos 38 - 126 U/L 66  AST 15 - 41 U/L 14(L)  ALT 14 - 54 U/L 9(L)    Discharge instruction: Rx 6 tablets of oxycodone provided until patient has her follow up appointment. Rx Zofran also given  After visit meds:    Medication List    TAKE these medications        acetaminophen 500 MG tablet  Commonly known as:  TYLENOL  Take 1,000 mg by mouth every 6 (six) hours as needed for headache.     cephALEXin 500 MG capsule  Commonly known as:  KEFLEX  Take 1 capsule (500 mg total) by mouth 4 (four) times daily.     diphenhydrAMINE 25 MG tablet  Commonly known as:  BENADRYL  Take 25 mg by mouth every 6 (six) hours as needed for itching.     ondansetron 8 MG disintegrating tablet  Commonly known as:  ZOFRAN ODT  Take 1 tablet (8 mg total) by mouth every 8 (eight) hours as needed for nausea or vomiting.     Oxycodone HCl 10 MG Tabs  Take  1 tablet (10 mg total) by mouth every 6 (six) hours. With 6 extra for breakthru pain/2wk     Oxycodone HCl 10 MG Tabs  Take 1 tablet (10 mg total) by mouth every 4 (four) hours as needed for moderate pain.     PRENATAL COMPLETE 14-0.4 MG Tabs  Take 1 tablet by mouth daily.     promethazine 25 MG tablet  Commonly known as:  PHENERGAN  Take 1 tablet (25 mg total) by mouth every 6 (six) hours as needed for nausea or vomiting.     sertraline 50 MG tablet  Commonly known as:  ZOLOFT  Take 1 tablet (50 mg total) by mouth daily.        Diet: routine diet  Activity: Advance as tolerated. .   Outpatient follow up:as scheduled tomorrow Follow up Appt: Future Appointments Date Time Provider Department Center  09/30/2015 2:30 PM Tilda BurrowJohn V Ferguson, MD [facility]   Follow up Visit:No Follow-up on file.     09/29/2015 Fredia Chittenden, MD

## 2015-09-30 ENCOUNTER — Telehealth (HOSPITAL_BASED_OUTPATIENT_CLINIC_OR_DEPARTMENT_OTHER): Payer: Self-pay | Admitting: Emergency Medicine

## 2015-09-30 ENCOUNTER — Ambulatory Visit (INDEPENDENT_AMBULATORY_CARE_PROVIDER_SITE_OTHER): Payer: Medicaid Other | Admitting: Obstetrics and Gynecology

## 2015-09-30 VITALS — BP 100/70 | HR 96

## 2015-09-30 DIAGNOSIS — Z1389 Encounter for screening for other disorder: Secondary | ICD-10-CM | POA: Diagnosis not present

## 2015-09-30 DIAGNOSIS — Z3A26 26 weeks gestation of pregnancy: Secondary | ICD-10-CM | POA: Diagnosis not present

## 2015-09-30 DIAGNOSIS — O09892 Supervision of other high risk pregnancies, second trimester: Secondary | ICD-10-CM

## 2015-09-30 DIAGNOSIS — O99322 Drug use complicating pregnancy, second trimester: Secondary | ICD-10-CM | POA: Diagnosis not present

## 2015-09-30 DIAGNOSIS — Z331 Pregnant state, incidental: Secondary | ICD-10-CM

## 2015-09-30 DIAGNOSIS — Z3492 Encounter for supervision of normal pregnancy, unspecified, second trimester: Secondary | ICD-10-CM

## 2015-09-30 DIAGNOSIS — K861 Other chronic pancreatitis: Secondary | ICD-10-CM

## 2015-09-30 DIAGNOSIS — O09292 Supervision of pregnancy with other poor reproductive or obstetric history, second trimester: Secondary | ICD-10-CM

## 2015-09-30 DIAGNOSIS — O99612 Diseases of the digestive system complicating pregnancy, second trimester: Secondary | ICD-10-CM | POA: Diagnosis not present

## 2015-09-30 DIAGNOSIS — O09212 Supervision of pregnancy with history of pre-term labor, second trimester: Secondary | ICD-10-CM | POA: Diagnosis not present

## 2015-09-30 LAB — POCT URINALYSIS DIPSTICK
Glucose, UA: 1
KETONES UA: NEGATIVE
LEUKOCYTES UA: NEGATIVE
NITRITE UA: NEGATIVE
RBC UA: NEGATIVE

## 2015-09-30 MED ORDER — OXYCODONE HCL 10 MG PO TABS: 10.0000 mg | ORAL_TABLET | ORAL | Status: DC | PRN

## 2015-09-30 NOTE — Progress Notes (Signed)
ED Antimicrobial Stewardship Positive Culture Follow Up   [name] is an 30 y.o. female who [facility] with a chief complaint of  Chief Complaint  Patient presents with  . Abdominal Pain    Recent Results (from the past 720 hour(s))  Urine culture     Status: Abnormal   Collection Time: 09/26/15 10:50 PM  Result Value Ref Range Status   Specimen Description URINE, CLEAN CATCH  Final   Special Requests NONE  Final   Culture >=100,000 COLONIES/mL ENTEROCOCCUS SPECIES (A)  Final   Report Status 09/29/2015 FINAL  Final   Organism ID, Bacteria ENTEROCOCCUS SPECIES (A)  Final      Susceptibility   Enterococcus species - MIC*    AMPICILLIN <=2 SENSITIVE Sensitive     LEVOFLOXACIN 1 SENSITIVE Sensitive     NITROFURANTOIN <=16 SENSITIVE Sensitive     VANCOMYCIN 2 SENSITIVE Sensitive     LINEZOLID 2 SENSITIVE Sensitive     * >=100,000 COLONIES/mL ENTEROCOCCUS SPECIES    [facility] with Keflex for UTI. Enterococcus in [facility] - cephalosporins resistant.   New antibiotic prescription: Stop Keflex. Start Macrobid 100 mg PO BID x 5 days.  ED Provider: Cheri FowlerKayla Rose, PA-C  Delos Klich L. Roseanne RenoStewart, PharmD PGY2 Infectious Diseases Pharmacy Resident Pager: [phone] 09/30/2015 8:55 AM

## 2015-09-30 NOTE — Progress Notes (Signed)
Pt denies any problems or concerns at this time.  

## 2015-09-30 NOTE — Telephone Encounter (Signed)
Post ED Visit - Positive Culture Follow-up: Successful Patient Follow-Up  Culture assessed and recommendations reviewed by: []  Enzo BiNathan Batchelder, Pharm.D. []  Celedonio MiyamotoJeremy Frens, Pharm.D., BCPS []  Garvin FilaMike Maccia, Pharm.D. []  Georgina PillionElizabeth Martin, Pharm.D., BCPS []  Hope ValleyMinh Pham, [address] BoulevardPharm[address]D., BCPS, AAHIVP []  Estella HuskMichelle Turner, Pharm.D., BCPS, AAHIVP [x]  Tennis Mustassie Stewart, Pharm.D. []  Sherle Poeob Vincent, [address]Ph[address]  Positive urine culture  []  Patient discharged without antimicrobial prescription and treatment is now indicated [x]  Organism is resistant to prescribed ED discharge antimicrobial []  Patient with positive blood cultures  Changes discussed with ED provider: Cheri FowlerKayla Rose PA New antibiotic prescription stop Keflex, start Macrobid 100mg  po bid x 5 days  Attempting to contact patient   Berle MullMiller, Annali Lybrand 09/30/2015, 9:50 AM

## 2015-10-01 LAB — PMP SCREEN PROFILE (10S), URINE
AMPHETAMINE SCRN UR: NEGATIVE ng/mL
Barbiturate Screen, Ur: NEGATIVE ng/mL
Benzodiazepine Screen, Urine: NEGATIVE ng/mL
CANNABINOIDS UR QL SCN: POSITIVE ng/mL
CREATININE(CRT), U: 196.4 mg/dL (ref 20.0–300.0)
Cocaine(Metab.)Screen, Urine: NEGATIVE ng/mL
METHADONE SCREEN, URINE: NEGATIVE ng/mL
OPIATE SCRN UR: POSITIVE ng/mL
OXYCODONE+OXYMORPHONE UR QL SCN: POSITIVE ng/mL
PCP Scrn, Ur: NEGATIVE ng/mL
PH UR, DRUG SCRN: 6.4 (ref 4.5–8.9)
PROPOXYPHENE SCREEN: NEGATIVE ng/mL

## 2015-10-06 ENCOUNTER — Other Ambulatory Visit: Payer: Self-pay | Admitting: Obstetrics and Gynecology

## 2015-10-06 DIAGNOSIS — O99322 Drug use complicating pregnancy, second trimester: Secondary | ICD-10-CM

## 2015-10-08 ENCOUNTER — Encounter: Payer: Self-pay | Admitting: Obstetrics and Gynecology

## 2015-10-08 ENCOUNTER — Ambulatory Visit (INDEPENDENT_AMBULATORY_CARE_PROVIDER_SITE_OTHER): Payer: Medicaid Other | Admitting: Obstetrics and Gynecology

## 2015-10-08 ENCOUNTER — Ambulatory Visit (INDEPENDENT_AMBULATORY_CARE_PROVIDER_SITE_OTHER): Payer: Medicaid Other

## 2015-10-08 VITALS — BP 110/60 | HR 92 | Wt 145.0 lb

## 2015-10-08 DIAGNOSIS — O99322 Drug use complicating pregnancy, second trimester: Secondary | ICD-10-CM | POA: Diagnosis not present

## 2015-10-08 DIAGNOSIS — O09892 Supervision of other high risk pregnancies, second trimester: Secondary | ICD-10-CM

## 2015-10-08 DIAGNOSIS — O09212 Supervision of pregnancy with history of pre-term labor, second trimester: Secondary | ICD-10-CM

## 2015-10-08 DIAGNOSIS — O09292 Supervision of pregnancy with other poor reproductive or obstetric history, second trimester: Secondary | ICD-10-CM | POA: Diagnosis not present

## 2015-10-08 DIAGNOSIS — K861 Other chronic pancreatitis: Secondary | ICD-10-CM

## 2015-10-08 DIAGNOSIS — O0992 Supervision of high risk pregnancy, unspecified, second trimester: Secondary | ICD-10-CM

## 2015-10-08 DIAGNOSIS — O99612 Diseases of the digestive system complicating pregnancy, second trimester: Secondary | ICD-10-CM

## 2015-10-08 DIAGNOSIS — Z3A27 27 weeks gestation of pregnancy: Secondary | ICD-10-CM

## 2015-10-08 DIAGNOSIS — Z1389 Encounter for screening for other disorder: Secondary | ICD-10-CM

## 2015-10-08 DIAGNOSIS — Z3492 Encounter for supervision of normal pregnancy, unspecified, second trimester: Secondary | ICD-10-CM

## 2015-10-08 DIAGNOSIS — Z331 Pregnant state, incidental: Secondary | ICD-10-CM

## 2015-10-08 LAB — POCT URINALYSIS DIPSTICK
Blood, UA: NEGATIVE
KETONES UA: NEGATIVE
Leukocytes, UA: NEGATIVE
Nitrite, UA: NEGATIVE

## 2015-10-08 MED ORDER — OXYCODONE HCL 10 MG PO TABS: 10.0000 mg | ORAL_TABLET | Freq: Four times a day (QID) | ORAL | Status: DC

## 2015-10-08 NOTE — Progress Notes (Signed)
Pt denies any problems or concerns at this time.  
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
## 2015-10-08 NOTE — Progress Notes (Signed)
US 26+5 wks,cephalic,ant pl gr 1,normal ov's bilat,cx 3.4 cm,svp of fluid 5.9 cm,efw 1151 g 70%,fhr 146 bpm

## 2015-10-09 LAB — PMP SCREEN PROFILE (10S), URINE
AMPHETAMINE SCRN UR: NEGATIVE ng/mL
BARBITURATE SCRN UR: NEGATIVE ng/mL
Benzodiazepine Screen, Urine: NEGATIVE ng/mL
CREATININE(CRT), U: 237.5 mg/dL (ref 20.0–300.0)
Cannabinoids Ur Ql Scn: POSITIVE ng/mL
Cocaine(Metab.)Screen, Urine: NEGATIVE ng/mL
METHADONE SCREEN, URINE: NEGATIVE ng/mL
Opiate Scrn, Ur: NEGATIVE ng/mL
Oxycodone+Oxymorphone Ur Ql Scn: POSITIVE ng/mL
PCP Scrn, Ur: NEGATIVE ng/mL
PH UR, DRUG SCRN: 6 (ref 4.5–8.9)
PROPOXYPHENE SCREEN: NEGATIVE ng/mL

## 2015-10-16 ENCOUNTER — Other Ambulatory Visit: Payer: Self-pay | Admitting: Obstetrics and Gynecology

## 2015-10-17 ENCOUNTER — Other Ambulatory Visit: Payer: Self-pay | Admitting: Obstetrics and Gynecology

## 2015-10-17 DIAGNOSIS — F1120 Opioid dependence, uncomplicated: Secondary | ICD-10-CM

## 2015-10-17 DIAGNOSIS — K861 Other chronic pancreatitis: Secondary | ICD-10-CM

## 2015-10-18 ENCOUNTER — Encounter (HOSPITAL_COMMUNITY): Payer: Self-pay

## 2015-10-18 ENCOUNTER — Inpatient Hospital Stay (HOSPITAL_COMMUNITY)
Admission: AD | Admit: 2015-10-18 | Discharge: 2015-10-18 | Disposition: A | Discharge status: Home / Self Care | Payer: Medicaid Other | Source: Ambulatory Visit | Attending: Family Medicine | Admitting: Family Medicine

## 2015-10-18 DIAGNOSIS — Z79899 Other long term (current) drug therapy: Secondary | ICD-10-CM | POA: Diagnosis not present

## 2015-10-18 DIAGNOSIS — O99333 Smoking (tobacco) complicating pregnancy, third trimester: Secondary | ICD-10-CM | POA: Insufficient documentation

## 2015-10-18 DIAGNOSIS — Z91018 Allergy to other foods: Secondary | ICD-10-CM | POA: Diagnosis not present

## 2015-10-18 DIAGNOSIS — Z3A28 28 weeks gestation of pregnancy: Secondary | ICD-10-CM | POA: Insufficient documentation

## 2015-10-18 DIAGNOSIS — O4703 False labor before 37 completed weeks of gestation, third trimester: Secondary | ICD-10-CM | POA: Insufficient documentation

## 2015-10-18 DIAGNOSIS — O23593 Infection of other part of genital tract in pregnancy, third trimester: Secondary | ICD-10-CM | POA: Diagnosis not present

## 2015-10-18 DIAGNOSIS — O09212 Supervision of pregnancy with history of pre-term labor, second trimester: Secondary | ICD-10-CM

## 2015-10-18 DIAGNOSIS — O219 Vomiting of pregnancy, unspecified: Secondary | ICD-10-CM

## 2015-10-18 DIAGNOSIS — O133 Gestational [pregnancy-induced] hypertension without significant proteinuria, third trimester: Secondary | ICD-10-CM | POA: Insufficient documentation

## 2015-10-18 DIAGNOSIS — F17210 Nicotine dependence, cigarettes, uncomplicated: Secondary | ICD-10-CM | POA: Diagnosis not present

## 2015-10-18 DIAGNOSIS — Z91030 Bee allergy status: Secondary | ICD-10-CM | POA: Diagnosis not present

## 2015-10-18 DIAGNOSIS — O09892 Supervision of other high risk pregnancies, second trimester: Secondary | ICD-10-CM

## 2015-10-18 DIAGNOSIS — Z885 Allergy status to narcotic agent status: Secondary | ICD-10-CM | POA: Diagnosis not present

## 2015-10-18 DIAGNOSIS — K8681 Exocrine pancreatic insufficiency: Secondary | ICD-10-CM | POA: Insufficient documentation

## 2015-10-18 DIAGNOSIS — N760 Acute vaginitis: Secondary | ICD-10-CM

## 2015-10-18 DIAGNOSIS — O10913 Unspecified pre-existing hypertension complicating pregnancy, third trimester: Secondary | ICD-10-CM

## 2015-10-18 DIAGNOSIS — B9689 Other specified bacterial agents as the cause of diseases classified elsewhere: Secondary | ICD-10-CM | POA: Insufficient documentation

## 2015-10-18 DIAGNOSIS — K861 Other chronic pancreatitis: Secondary | ICD-10-CM | POA: Diagnosis not present

## 2015-10-18 LAB — COMPREHENSIVE METABOLIC PANEL
ALBUMIN: 3.3 g/dL — AB (ref 3.5–5.0)
ALT: 11 U/L — ABNORMAL LOW (ref 14–54)
AST: 15 U/L (ref 15–41)
Alkaline Phosphatase: 93 U/L (ref 38–126)
Anion gap: 10 (ref 5–15)
BUN: 9 mg/dL (ref 6–20)
CHLORIDE: 101 mmol/L (ref 101–111)
CO2: 20 mmol/L — ABNORMAL LOW (ref 22–32)
Calcium: 9.5 mg/dL (ref 8.9–10.3)
Creatinine, Ser: 0.47 mg/dL (ref 0.44–1.00)
GFR calc Af Amer: >60 mL/min (ref >60)
GLUCOSE: 86 mg/dL (ref 65–99)
POTASSIUM: 4.2 mmol/L (ref 3.5–5.1)
Sodium: 131 mmol/L — ABNORMAL LOW (ref 135–145)
Total Bilirubin: 0.4 mg/dL (ref 0.3–1.2)
Total Protein: 7.4 g/dL (ref 6.5–8.1)

## 2015-10-18 LAB — URINALYSIS, ROUTINE W REFLEX MICROSCOPIC
Bilirubin Urine: NEGATIVE
Glucose, UA: 250 mg/dL — AB
Hgb urine dipstick: NEGATIVE
KETONES UR: NEGATIVE mg/dL
NITRITE: NEGATIVE
PROTEIN: NEGATIVE mg/dL
Specific Gravity, Urine: 1.025 (ref 1.005–1.030)
pH: 6 (ref 5.0–8.0)

## 2015-10-18 LAB — OB RESULTS CONSOLE GC/CHLAMYDIA: GC PROBE AMP, GENITAL: NEGATIVE

## 2015-10-18 LAB — CBC
HEMATOCRIT: 31.2 % — AB (ref 36.0–46.0)
HEMOGLOBIN: 10.6 g/dL — AB (ref 12.0–15.0)
MCH: 29.4 pg (ref 26.0–34.0)
MCHC: 34 g/dL (ref 30.0–36.0)
MCV: 86.4 fL (ref 78.0–100.0)
Platelets: 349 10*3/uL (ref 150–400)
RBC: 3.61 MIL/uL — ABNORMAL LOW (ref 3.87–5.11)
RDW: 15 % (ref 11.5–15.5)
WBC: 14.9 10*3/uL — AB (ref 4.0–10.5)

## 2015-10-18 LAB — WET PREP, GENITAL
Sperm: NONE SEEN
Trich, Wet Prep: NONE SEEN
Yeast Wet Prep HPF POC: NONE SEEN

## 2015-10-18 LAB — URINE MICROSCOPIC-ADD ON: RBC / HPF: NONE SEEN RBC/hpf (ref 0–5)

## 2015-10-18 LAB — POCT FERN TEST: POCT Fern Test: NEGATIVE

## 2015-10-18 LAB — LIPASE, BLOOD: LIPASE: 47 U/L (ref 11–51)

## 2015-10-18 LAB — PROTEIN / CREATININE RATIO, URINE
CREATININE, URINE: 205 mg/dL
PROTEIN CREATININE RATIO: 0.12 mg/mg{creat} (ref 0.00–0.15)
TOTAL PROTEIN, URINE: 25 mg/dL

## 2015-10-18 MED ORDER — LABETALOL HCL 5 MG/ML IV SOLN: 20.0000 mg | INTRAVENOUS | Status: DC | PRN

## 2015-10-18 MED ORDER — HYDROMORPHONE HCL 1 MG/ML IJ SOLN
1.0000 mg | Freq: Once | INTRAMUSCULAR | Status: AC
  Administered 2015-10-18: 1 mg via INTRAVENOUS
  Filled 2015-10-18: qty 1

## 2015-10-18 MED ORDER — HYDRALAZINE HCL 20 MG/ML IJ SOLN: 10.0000 mg | Freq: Once | INTRAMUSCULAR | Status: DC | PRN

## 2015-10-18 MED ORDER — PROMETHAZINE HCL 25 MG/ML IJ SOLN
25.0000 mg | Freq: Once | INTRAVENOUS | Status: AC
  Administered 2015-10-18: 25 mg via INTRAVENOUS
  Filled 2015-10-18: qty 1

## 2015-10-18 MED ORDER — ONDANSETRON 8 MG PO TBDP
8.0000 mg | ORAL_TABLET | Freq: Once | ORAL | Status: AC
Start: 2015-10-18 — End: 2015-10-18
  Administered 2015-10-18: 8 mg via ORAL
  Filled 2015-10-18: qty 1

## 2015-10-18 MED ORDER — LACTATED RINGERS IV BOLUS (SEPSIS)
1000.0000 mL | Freq: Once | INTRAVENOUS | Status: AC
  Administered 2015-10-18: 1000 mL via INTRAVENOUS

## 2015-10-18 MED ORDER — OXYCODONE HCL 5 MG PO TABS
10.0000 mg | ORAL_TABLET | Freq: Once | ORAL | Status: AC
  Administered 2015-10-18: 10 mg via ORAL
  Filled 2015-10-18: qty 2

## 2015-10-18 MED ORDER — METRONIDAZOLE 500 MG PO TABS: 500.0000 mg | ORAL_TABLET | Freq: Two times a day (BID) | ORAL | Status: DC

## 2015-10-18 MED ORDER — NIFEDIPINE 10 MG PO CAPS
10.0000 mg | ORAL_CAPSULE | ORAL | Status: DC | PRN
  Administered 2015-10-18 (×3): 10 mg via ORAL
  Filled 2015-10-18 (×3): qty 1

## 2015-10-18 MED ORDER — NIFEDIPINE 10 MG PO CAPS: 10.0000 mg | ORAL_CAPSULE | Freq: Four times a day (QID) | ORAL | Status: DC | PRN

## 2015-10-18 MED ORDER — ONDANSETRON 8 MG PO TBDP: 8.0000 mg | ORAL_TABLET | Freq: Three times a day (TID) | ORAL | Status: DC | PRN
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
## 2015-10-18 NOTE — Discharge Instructions (Signed)

## 2015-10-18 NOTE — Progress Notes (Signed)
CNM notified that pt is requesting additional pain medication. Pt states dilaudid iv is not helping with pain. CNM requests additional dose of PO procardia by given and will come assess pt.

## 2015-10-18 NOTE — Progress Notes (Signed)
RN observed pt pushing on her abdomen and possibly the toco. RN unsure if pt is making it appear that she is having contractions by manually pushing on toco.

## 2015-10-18 NOTE — MAU Note (Signed)
Pt c/o fluid leaking vaginally that started two days ago but is worse today. Pt c/o abdominal cramping that started around lunchtime today. Pt c/o pain in her upper abdomen that she states is associated with pancreatitis which is an on-going problem for her but is worse today. Pt states she had some spotting two days ago once when she wiped. Pt states baby is moving normally.

## 2015-10-18 NOTE — Progress Notes (Signed)
Pt states she vomited up all the pills. CNM notified.

## 2015-10-18 NOTE — Progress Notes (Signed)
CNM on unit. Requests for RN to wait on giving IV labetolol for BP since PO Procardia was administered.

## 2015-10-20 ENCOUNTER — Ambulatory Visit (INDEPENDENT_AMBULATORY_CARE_PROVIDER_SITE_OTHER): Payer: Medicaid Other | Admitting: Obstetrics and Gynecology

## 2015-10-20 ENCOUNTER — Ambulatory Visit (INDEPENDENT_AMBULATORY_CARE_PROVIDER_SITE_OTHER): Payer: Medicaid Other

## 2015-10-20 ENCOUNTER — Encounter: Payer: Self-pay | Admitting: Obstetrics and Gynecology

## 2015-10-20 VITALS — BP 90/50 | HR 107 | Wt 149.0 lb

## 2015-10-20 DIAGNOSIS — K861 Other chronic pancreatitis: Secondary | ICD-10-CM

## 2015-10-20 DIAGNOSIS — Z3A29 29 weeks gestation of pregnancy: Secondary | ICD-10-CM

## 2015-10-20 DIAGNOSIS — F1120 Opioid dependence, uncomplicated: Secondary | ICD-10-CM

## 2015-10-20 DIAGNOSIS — O99323 Drug use complicating pregnancy, third trimester: Secondary | ICD-10-CM

## 2015-10-20 DIAGNOSIS — Z331 Pregnant state, incidental: Secondary | ICD-10-CM

## 2015-10-20 DIAGNOSIS — O09293 Supervision of pregnancy with other poor reproductive or obstetric history, third trimester: Secondary | ICD-10-CM

## 2015-10-20 DIAGNOSIS — Z3491 Encounter for supervision of normal pregnancy, unspecified, first trimester: Secondary | ICD-10-CM

## 2015-10-20 DIAGNOSIS — Z1389 Encounter for screening for other disorder: Secondary | ICD-10-CM

## 2015-10-20 DIAGNOSIS — O09212 Supervision of pregnancy with history of pre-term labor, second trimester: Secondary | ICD-10-CM

## 2015-10-20 DIAGNOSIS — O99613 Diseases of the digestive system complicating pregnancy, third trimester: Secondary | ICD-10-CM

## 2015-10-20 DIAGNOSIS — O09892 Supervision of other high risk pregnancies, second trimester: Secondary | ICD-10-CM

## 2015-10-20 DIAGNOSIS — O0993 Supervision of high risk pregnancy, unspecified, third trimester: Secondary | ICD-10-CM

## 2015-10-20 LAB — POCT URINALYSIS DIPSTICK
Blood, UA: NEGATIVE
KETONES UA: NEGATIVE
Leukocytes, UA: NEGATIVE
Nitrite, UA: NEGATIVE
Protein, UA: NEGATIVE

## 2015-10-20 LAB — GC/CHLAMYDIA PROBE AMP (~~LOC~~) NOT AT ARMC
Chlamydia: NEGATIVE
Neisseria Gonorrhea: NEGATIVE

## 2015-10-20 MED ORDER — ONDANSETRON 8 MG PO TBDP: 8.0000 mg | ORAL_TABLET | Freq: Three times a day (TID) | ORAL | Status: DC | PRN

## 2015-10-20 MED ORDER — OXYCODONE HCL 10 MG PO TABS: 10.0000 mg | ORAL_TABLET | Freq: Four times a day (QID) | ORAL | Status: DC

## 2015-10-20 NOTE — Progress Notes (Signed)
Pt denies any other problems or concerns at this time.

## 2015-10-20 NOTE — Progress Notes (Signed)
US 28+3 wks,BPP 8/8,cephalic,ant pl gr 1,fhr 144 bpm normal ov's bilat,afi 12 cm,cx 3.3 cm
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
## 2015-10-22 ENCOUNTER — Encounter: Payer: Self-pay | Admitting: Obstetrics and Gynecology

## 2015-10-22 LAB — PMP SCREEN PROFILE (10S), URINE
AMPHETAMINE SCRN UR: NEGATIVE ng/mL
Barbiturate Screen, Ur: NEGATIVE ng/mL
Benzodiazepine Screen, Urine: NEGATIVE ng/mL
Cannabinoids Ur Ql Scn: NEGATIVE ng/mL
Cocaine(Metab.)Screen, Urine: NEGATIVE ng/mL
Creatinine(Crt), U: 94.2 mg/dL (ref 20.0–300.0)
METHADONE SCREEN, URINE: NEGATIVE ng/mL
OPIATE SCRN UR: POSITIVE ng/mL
OXYCODONE+OXYMORPHONE UR QL SCN: NEGATIVE ng/mL
PCP Scrn, Ur: NEGATIVE ng/mL
Ph of Urine: 6.6 (ref 4.5–8.9)
Propoxyphene, Screen: NEGATIVE ng/mL

## 2015-10-26 ENCOUNTER — Encounter (HOSPITAL_COMMUNITY)
Admission: RE | Admit: 2015-10-26 | Discharge: 2015-10-26 | Disposition: A | Discharge status: Home / Self Care | Payer: Medicaid Other | Source: Ambulatory Visit | Attending: Obstetrics and Gynecology | Admitting: Obstetrics and Gynecology

## 2015-11-02 ENCOUNTER — Inpatient Hospital Stay (HOSPITAL_COMMUNITY)
Admission: AD | Admit: 2015-11-02 | Discharge: 2015-11-03 | Disposition: A | Discharge status: Home / Self Care | Payer: Medicaid Other | Source: Ambulatory Visit | Attending: Obstetrics & Gynecology | Admitting: Obstetrics & Gynecology

## 2015-11-02 ENCOUNTER — Encounter (HOSPITAL_COMMUNITY): Payer: Self-pay | Admitting: *Deleted

## 2015-11-02 DIAGNOSIS — O9989 Other specified diseases and conditions complicating pregnancy, childbirth and the puerperium: Secondary | ICD-10-CM | POA: Diagnosis not present

## 2015-11-02 DIAGNOSIS — O99613 Diseases of the digestive system complicating pregnancy, third trimester: Secondary | ICD-10-CM | POA: Diagnosis not present

## 2015-11-02 DIAGNOSIS — O09212 Supervision of pregnancy with history of pre-term labor, second trimester: Secondary | ICD-10-CM

## 2015-11-02 DIAGNOSIS — O0993 Supervision of high risk pregnancy, unspecified, third trimester: Secondary | ICD-10-CM

## 2015-11-02 DIAGNOSIS — Z3A30 30 weeks gestation of pregnancy: Secondary | ICD-10-CM | POA: Diagnosis not present

## 2015-11-02 DIAGNOSIS — O10013 Pre-existing essential hypertension complicating pregnancy, third trimester: Secondary | ICD-10-CM | POA: Diagnosis not present

## 2015-11-02 DIAGNOSIS — O09892 Supervision of other high risk pregnancies, second trimester: Secondary | ICD-10-CM

## 2015-11-02 DIAGNOSIS — K859 Acute pancreatitis without necrosis or infection, unspecified: Secondary | ICD-10-CM | POA: Insufficient documentation

## 2015-11-02 DIAGNOSIS — O99333 Smoking (tobacco) complicating pregnancy, third trimester: Secondary | ICD-10-CM | POA: Diagnosis not present

## 2015-11-02 DIAGNOSIS — F17210 Nicotine dependence, cigarettes, uncomplicated: Secondary | ICD-10-CM | POA: Insufficient documentation

## 2015-11-02 DIAGNOSIS — R109 Unspecified abdominal pain: Secondary | ICD-10-CM | POA: Diagnosis present

## 2015-11-02 DIAGNOSIS — K219 Gastro-esophageal reflux disease without esophagitis: Secondary | ICD-10-CM | POA: Insufficient documentation

## 2015-11-02 DIAGNOSIS — Z3492 Encounter for supervision of normal pregnancy, unspecified, second trimester: Secondary | ICD-10-CM

## 2015-11-02 DIAGNOSIS — Z3490 Encounter for supervision of normal pregnancy, unspecified, unspecified trimester: Secondary | ICD-10-CM

## 2015-11-02 LAB — CBC WITH DIFFERENTIAL/PLATELET
BASOS ABS: 0 10*3/uL (ref 0.0–0.1)
Basophils Relative: 0 %
EOS ABS: 0.2 10*3/uL (ref 0.0–0.7)
EOS PCT: 1 %
HCT: 31.2 % — ABNORMAL LOW (ref 36.0–46.0)
Hemoglobin: 10.4 g/dL — ABNORMAL LOW (ref 12.0–15.0)
LYMPHS ABS: 2.2 10*3/uL (ref 0.7–4.0)
Lymphocytes Relative: 15 %
MCH: 28.9 pg (ref 26.0–34.0)
MCHC: 33.3 g/dL (ref 30.0–36.0)
MCV: 86.7 fL (ref 78.0–100.0)
Monocytes Absolute: 1.3 10*3/uL — ABNORMAL HIGH (ref 0.1–1.0)
Monocytes Relative: 9 %
Neutro Abs: 10.5 10*3/uL — ABNORMAL HIGH (ref 1.7–7.7)
Neutrophils Relative %: 75 %
PLATELETS: 332 10*3/uL (ref 150–400)
RBC: 3.6 MIL/uL — AB (ref 3.87–5.11)
RDW: 14.5 % (ref 11.5–15.5)
WBC: 14.2 10*3/uL — AB (ref 4.0–10.5)

## 2015-11-02 LAB — COMPREHENSIVE METABOLIC PANEL
ALT: 11 U/L — ABNORMAL LOW (ref 14–54)
ANION GAP: 6 (ref 5–15)
AST: 15 U/L (ref 15–41)
Albumin: 2.9 g/dL — ABNORMAL LOW (ref 3.5–5.0)
Alkaline Phosphatase: 115 U/L (ref 38–126)
BUN: 7 mg/dL (ref 6–20)
CHLORIDE: 104 mmol/L (ref 101–111)
CO2: 22 mmol/L (ref 22–32)
Calcium: 9 mg/dL (ref 8.9–10.3)
Creatinine, Ser: 0.48 mg/dL (ref 0.44–1.00)
GFR calc Af Amer: >60 mL/min (ref >60)
Glucose, Bld: 90 mg/dL (ref 65–99)
POTASSIUM: 4.1 mmol/L (ref 3.5–5.1)
Sodium: 132 mmol/L — ABNORMAL LOW (ref 135–145)
Total Bilirubin: 0.2 mg/dL — ABNORMAL LOW (ref 0.3–1.2)
Total Protein: 6.8 g/dL (ref 6.5–8.1)

## 2015-11-02 LAB — URINE MICROSCOPIC-ADD ON

## 2015-11-02 LAB — URINALYSIS, ROUTINE W REFLEX MICROSCOPIC
Bilirubin Urine: NEGATIVE
Glucose, UA: 250 mg/dL — AB
Hgb urine dipstick: NEGATIVE
Ketones, ur: NEGATIVE mg/dL
NITRITE: NEGATIVE
Protein, ur: NEGATIVE mg/dL
SPECIFIC GRAVITY, URINE: 1.015 (ref 1.005–1.030)
pH: 6 (ref 5.0–8.0)

## 2015-11-02 LAB — LIPASE, BLOOD: LIPASE: 72 U/L — AB (ref 11–51)

## 2015-11-02 MED ORDER — ONDANSETRON HCL 4 MG/2ML IJ SOLN
4.0000 mg | Freq: Once | INTRAMUSCULAR | Status: AC
  Administered 2015-11-02: 4 mg via INTRAVENOUS
  Filled 2015-11-02: qty 2

## 2015-11-02 MED ORDER — LACTATED RINGERS IV BOLUS (SEPSIS)
1000.0000 mL | Freq: Once | INTRAVENOUS | Status: AC
  Administered 2015-11-02: 1000 mL via INTRAVENOUS

## 2015-11-02 NOTE — ED Notes (Signed)
Amber HawkingAnnie Penn ED RN notified RROB of pt in due to upper gi pain. Pt reports a history of pancreatitis. Pt also reports positive fetal movement, no vaginal bleeding, no leaking of fluid, no lower abdominal pain/contractions, reports some vaginal discharge that is whitish/clear with no foul odor. RROB will monitor efm.
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
## 2015-11-02 NOTE — ED Notes (Signed)
Pt c/o epigastric pain and lower abd pain that started today with n/v

## 2015-11-02 NOTE — ED Notes (Signed)
Pt off monitor to transfer to [facility]

## 2015-11-02 NOTE — MAU Note (Signed)
Pt has chronic pancreatits, she states it feels like it is acting up again or is having ctx.Reports LOF and good fetal movement. Denies VB. Hx of PTL.

## 2015-11-02 NOTE — ED Notes (Signed)
RROB spoke with Dr Adriana Simasook, [facility] provider and told of pt's history and that she is contracting asked for order for LR fluid bolus of 1000ml; RROB then [facility] Debro[facility] LoopArnold[facility] and told of pt history and contraction pattern; [facility]; [facility] then spoke with Dr Adriana Simasook and told him of orders to transfer pt; [facility]

## 2015-11-03 ENCOUNTER — Encounter: Payer: Self-pay | Admitting: Women's Health

## 2015-11-03 ENCOUNTER — Other Ambulatory Visit: Payer: Self-pay | Admitting: Obstetrics and Gynecology

## 2015-11-03 ENCOUNTER — Ambulatory Visit (INDEPENDENT_AMBULATORY_CARE_PROVIDER_SITE_OTHER): Payer: Medicaid Other | Admitting: Women's Health

## 2015-11-03 VITALS — BP 112/80 | HR 96 | Wt 154.5 lb

## 2015-11-03 DIAGNOSIS — Z79891 Long term (current) use of opiate analgesic: Secondary | ICD-10-CM

## 2015-11-03 DIAGNOSIS — O360130 Maternal care for anti-D [Rh] antibodies, third trimester, not applicable or unspecified: Secondary | ICD-10-CM | POA: Diagnosis not present

## 2015-11-03 DIAGNOSIS — O99321 Drug use complicating pregnancy, first trimester: Secondary | ICD-10-CM | POA: Diagnosis not present

## 2015-11-03 DIAGNOSIS — O09893 Supervision of other high risk pregnancies, third trimester: Secondary | ICD-10-CM

## 2015-11-03 DIAGNOSIS — Z1389 Encounter for screening for other disorder: Secondary | ICD-10-CM | POA: Diagnosis not present

## 2015-11-03 DIAGNOSIS — O09293 Supervision of pregnancy with other poor reproductive or obstetric history, third trimester: Secondary | ICD-10-CM | POA: Diagnosis not present

## 2015-11-03 DIAGNOSIS — O99613 Diseases of the digestive system complicating pregnancy, third trimester: Secondary | ICD-10-CM | POA: Diagnosis not present

## 2015-11-03 DIAGNOSIS — Z3A31 31 weeks gestation of pregnancy: Secondary | ICD-10-CM

## 2015-11-03 DIAGNOSIS — R87610 Atypical squamous cells of undetermined significance on cytologic smear of cervix (ASC-US): Secondary | ICD-10-CM

## 2015-11-03 DIAGNOSIS — F1290 Cannabis use, unspecified, uncomplicated: Secondary | ICD-10-CM

## 2015-11-03 DIAGNOSIS — Z331 Pregnant state, incidental: Secondary | ICD-10-CM | POA: Diagnosis not present

## 2015-11-03 DIAGNOSIS — R87810 Cervical high risk human papillomavirus (HPV) DNA test positive: Secondary | ICD-10-CM

## 2015-11-03 LAB — POCT URINALYSIS DIPSTICK
Glucose, UA: NEGATIVE
Ketones, UA: NEGATIVE
Leukocytes, UA: NEGATIVE
Nitrite, UA: NEGATIVE

## 2015-11-03 LAB — FETAL FIBRONECTIN: Fetal Fibronectin: NEGATIVE

## 2015-11-03 MED ORDER — RHO D IMMUNE GLOBULIN 1500 UNIT/2ML IJ SOSY: 300.0000 ug | PREFILLED_SYRINGE | Freq: Once | INTRAMUSCULAR | Status: DC

## 2015-11-03 MED ORDER — OXYCODONE HCL 10 MG PO TABS: 10.0000 mg | ORAL_TABLET | Freq: Four times a day (QID) | ORAL | Status: DC

## 2015-11-03 MED ORDER — ONDANSETRON 4 MG PO TBDP
4.0000 mg | ORAL_TABLET | Freq: Three times a day (TID) | ORAL | Status: DC | PRN
  Administered 2015-11-03: 4 mg via ORAL
  Filled 2015-11-03: qty 1

## 2015-11-03 MED ORDER — BUTALBITAL-APAP-CAFFEINE 50-325-40 MG PO TABS
1.0000 | ORAL_TABLET | ORAL | Status: DC | PRN
  Administered 2015-11-03: 1 via ORAL
  Filled 2015-11-03: qty 1

## 2015-11-03 NOTE — Patient Instructions (Addendum)
Fetal echocardiogram 11/24/15 @ 1pm   [address]  You will have your sugar test next visit.  Please do not eat or drink anything after midnight the night before you come, not even water.  You will be here for at least two hours.     Call the office [phone]) or go to [facility] if:  You begin to have strong, frequent contractions  Your water breaks.  Sometimes it is a big gush of fluid, sometimes it is just a trickle that keeps getting your panties wet or running down your legs  You have vaginal bleeding.  It is normal to have a small amount of spotting if your cervix was checked.   You don't feel your baby moving like normal.  If you don't, get you something to eat and drink and lay down and focus on feeling your baby move.  You should feel at least 10 movements in 2 hours.  If you don't, you should call the office or go to [facility].    Tdap Vaccine  It is recommended that you get the Tdap vaccine during the third trimester of EACH pregnancy to help protect your baby from getting pertussis (whooping cough)  27-36 weeks is the BEST time to do this so that you can pass the protection on to your baby. During pregnancy is better than after pregnancy, but if you are unable to get it during pregnancy it will be offered at the hospital.   You can get this vaccine at the health department or your family doctor  Everyone who will be around your baby should also be up-to-date on their vaccines. Adults (who are not pregnant) only need 1 dose of Tdap during adulthood.    Third Trimester of Pregnancy The third trimester is from week 29 through week 42, months 7 through 9. The third trimester is a time when the fetus is growing rapidly. At the end of the ninth month, the fetus is about 20 inches in length and weighs 6-10 pounds.  BODY CHANGES Your body goes through many changes during pregnancy. The changes vary from woman to woman.   Your weight will  continue to increase. You can expect to gain 25-35 pounds (11-16 kg) by the end of the pregnancy.  You may begin to get stretch marks on your hips, abdomen, and breasts.  You may urinate more often because the fetus is moving lower into your pelvis and pressing on your bladder.  You may develop or continue to have heartburn as a result of your pregnancy.  You may develop constipation because certain hormones are causing the muscles that push waste through your intestines to slow down.  You may develop hemorrhoids or swollen, bulging veins (varicose veins).  You may have pelvic pain because of the weight gain and pregnancy hormones relaxing your joints between the bones in your pelvis. Backaches may result from overexertion of the muscles supporting your posture.  You may have changes in your hair. These can include thickening of your hair, rapid growth, and changes in texture. Some women also have hair loss during or after pregnancy, or hair that feels dry or thin. Your hair will most likely return to normal after your baby is born.  Your breasts will continue to grow and be tender. A yellow discharge may leak from your breasts called colostrum.  Your belly button may stick out.  You may feel short of breath because of your expanding uterus.  You may notice the fetus "dropping," or moving lower in your abdomen.  You may have a bloody mucus discharge. This usually occurs a few days to a week before labor begins.  Your cervix becomes thin and soft (effaced) near your due date. WHAT TO EXPECT AT YOUR PRENATAL EXAMS  You will have prenatal exams every 2 weeks until week 36. Then, you will have weekly prenatal exams. During a routine prenatal visit:  You will be weighed to make sure you and the fetus are growing normally.  Your blood pressure is taken.  Your abdomen will be measured to track your baby's growth.  The fetal heartbeat will be listened to.  Any test results from the  previous visit will be discussed.  You may have a cervical check near your due date to see if you have effaced. At around 36 weeks, your caregiver will check your cervix. At the same time, your caregiver will also perform a test on the secretions of the vaginal tissue. This test is to determine if a type of bacteria, Group B streptococcus, is present. Your caregiver will explain this further. Your caregiver may ask you:  What your birth plan is.  How you are feeling.  If you are feeling the baby move.  If you have had any abnormal symptoms, such as leaking fluid, bleeding, severe headaches, or abdominal cramping.  If you are using any tobacco products, including cigarettes, chewing tobacco, and electronic cigarettes.  If you have any questions. Other tests or screenings that may be performed during your third trimester include:  Blood tests that check for low iron levels (anemia).  Fetal testing to check the health, activity level, and growth of the fetus. Testing is done if you have certain medical conditions or if there are problems during the pregnancy.  HIV (human immunodeficiency virus) testing. If you are at high risk, you may be screened for HIV during your third trimester of pregnancy. FALSE LABOR You may feel small, irregular contractions that eventually go away. These are called Braxton Hicks contractions, or false labor. Contractions may last for hours, days, or even weeks before true labor sets in. If contractions come at regular intervals, intensify, or become painful, it is best to be seen by your caregiver.  SIGNS OF LABOR   Menstrual-like cramps.  Contractions that are 5 minutes apart or less.  Contractions that start on the top of the uterus and spread down to the lower abdomen and back.  A sense of increased pelvic pressure or back pain.  A watery or bloody mucus discharge that comes from the vagina. If you have any of these signs before the 37th week of  pregnancy, call your caregiver right away. You need to go to the hospital to get checked immediately. HOME CARE INSTRUCTIONS   Avoid all smoking, herbs, alcohol, and unprescribed drugs. These chemicals affect the formation and growth of the baby.  Do not use any tobacco products, including cigarettes, chewing tobacco, and electronic cigarettes. If you need help quitting, ask your health care provider. You may receive counseling support and other resources to help you quit.  Follow your caregiver's instructions regarding medicine use. There are medicines that are either safe or unsafe to take during pregnancy.  Exercise only as directed by your caregiver. Experiencing uterine cramps is a good sign to stop exercising.  Continue to eat regular, healthy meals.  Wear a good support bra for breast tenderness.  Do not use hot tubs, steam rooms, or saunas.  Wear your seat belt at all times when driving.  Avoid raw meat, uncooked cheese, cat litter boxes, and soil used by cats. These carry germs that can cause birth defects in the baby.  Take your prenatal vitamins.  Take 1500-2000 mg of calcium daily starting at the 20th week of pregnancy until you deliver your baby.  Try taking a stool softener (if your caregiver approves) if you develop constipation. Eat more high-fiber foods, such as fresh vegetables or fruit and whole grains. Drink plenty of fluids to keep your urine clear or pale yellow.  Take warm sitz baths to soothe any pain or discomfort caused by hemorrhoids. Use hemorrhoid cream if your caregiver approves.  If you develop varicose veins, wear support hose. Elevate your feet for 15 minutes, 3-4 times a day. Limit salt in your diet.  Avoid heavy lifting, wear low heal shoes, and practice good posture.  Rest a lot with your legs elevated if you have leg cramps or low back pain.  Visit your dentist if you have not gone during your pregnancy. Use a soft toothbrush to brush your teeth  and be gentle when you floss.  A sexual relationship may be continued unless your caregiver directs you otherwise.  Do not travel far distances unless it is absolutely necessary and only with the approval of your caregiver.  Take prenatal classes to understand, practice, and ask questions about the labor and delivery.  Make a trial run to the hospital.  Pack your hospital bag.  Prepare the baby's nursery.  Continue to go to all your prenatal visits as directed by your caregiver. SEEK MEDICAL CARE IF:  You are unsure if you are in labor or if your water has broken.  You have dizziness.  You have mild pelvic cramps, pelvic pressure, or nagging pain in your abdominal area.  You have persistent nausea, vomiting, or diarrhea.  You have a bad smelling vaginal discharge.  You have pain with urination. SEEK IMMEDIATE MEDICAL CARE IF:   You have a fever.  You are leaking fluid from your vagina.  You have spotting or bleeding from your vagina.  You have severe abdominal cramping or pain.  You have rapid weight loss or gain.  You have shortness of breath with chest pain.  You notice sudden or extreme swelling of your face, hands, ankles, feet, or legs.  You have not felt your baby move in over an hour.  You have severe headaches that do not go away with medicine.  You have vision changes.   This information is not intended to replace advice given to you by your health care provider. Make sure you discuss any questions you have with your health care provider.   Document Released: 03/30/2001 Document Revised: 04/26/2014 Document Reviewed: 06/06/2012 Elsevier Interactive Patient Education [employer]
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
## 2015-11-03 NOTE — Progress Notes (Signed)
Rhogam not given today due to pt not having sugar test yet. JSY

## 2015-11-03 NOTE — Progress Notes (Signed)
High Risk Pregnancy Diagnosis(es): opiate dependence d/t chronic pancreatitis, h/o IUFD w/ cardiac anomalies, h/o PTB G3P0201 6369w3d Estimated Date of Delivery: 01/09/16 BP 112/80 mmHg  Pulse 96  Wt 154 lb 8 oz (70.081 kg)  LMP 04/04/2015 (Exact Date)  Urinalysis: Positive for tr blood/protein HPI: Went to [facility] yesterday w/ pancreatic pain, was tx to [facility] d/t uc's, d/c'd w/ unchanged cx w/ neg fFN, lipase in 70s- has previously been as high as 700s. Stopped Makena earlier in pregnancy d/t side effects to arm. Taking procardia prn uc's.  Requesting refill on oxycodone, has been getting oxycodone 10mg  #56 q 14d from [facility], is requesting 'a few extra' d/t getting sick w/ pancreatitis and not able to keep pills down at times. Discussed w/ [facility]- will let him write rx as he has been managing during this pregnancy. Has been question of possible diversion of meds d/t multiple drug screens being neg for opiates this pregnancy, however uds at last visit was +for opiates. Reports she signed interval salpingectomy papers w/ [facility] 6/21, has her copy with her.  BP, weight, and urine reviewed.  Reports good fm. Denies regular uc's, lof, vb, uti s/s.  Was going to give Rhogam today, but noticed she has not had pn2, states she also hasn't had fetal echo   Fundal Height:  29 Fetal Heart rate:  140 Edema:  none  Reviewed ptl s/s, fkc.  All questions were answered Assessment: 5669w3d opiate dependence d/t chronic pancreatitis Medication(s) Plans:  Oxycodone refilled by [facility] Treatment Plan:  Scheduled fetal echo today- appt 8/7 @ 1pm Follow up in asap for pn2 (no visit), then 2wks for high-risk OB appt w/ [facility] & Rhogam

## 2015-11-03 NOTE — Telephone Encounter (Signed)
Refilled promethazine tablets 30 tablets with refills 1

## 2015-11-03 NOTE — Discharge Instructions (Signed)

## 2015-11-04 LAB — PMP SCREEN PROFILE (10S), URINE
Amphetamine Screen, Ur: NEGATIVE ng/mL
BARBITURATE SCRN UR: POSITIVE ng/mL
Benzodiazepine Screen, Urine: NEGATIVE ng/mL
CANNABINOIDS UR QL SCN: NEGATIVE ng/mL
COCAINE(METAB.) SCREEN, URINE: NEGATIVE ng/mL
Creatinine(Crt), U: 100 mg/dL (ref 20.0–300.0)
METHADONE SCREEN, URINE: NEGATIVE ng/mL
Opiate Scrn, Ur: POSITIVE ng/mL
Oxycodone+Oxymorphone Ur Ql Scn: POSITIVE ng/mL
PCP Scrn, Ur: NEGATIVE ng/mL
PH UR, DRUG SCRN: 8.4 (ref 4.5–8.9)
Propoxyphene, Screen: NEGATIVE ng/mL

## 2015-11-05 ENCOUNTER — Encounter: Payer: Self-pay | Admitting: Women's Health

## 2015-11-05 DIAGNOSIS — O99320 Drug use complicating pregnancy, unspecified trimester: Secondary | ICD-10-CM | POA: Insufficient documentation

## 2015-11-10 ENCOUNTER — Other Ambulatory Visit: Payer: Medicaid Other

## 2015-11-12 ENCOUNTER — Encounter (HOSPITAL_COMMUNITY): Payer: Self-pay

## 2015-11-12 ENCOUNTER — Inpatient Hospital Stay (HOSPITAL_COMMUNITY)
Admission: AD | Admit: 2015-11-12 | Discharge: 2015-11-19 | DRG: 781 | Disposition: A | Discharge status: Home / Self Care | Payer: Medicaid Other | Source: Ambulatory Visit | Attending: Family Medicine | Admitting: Family Medicine

## 2015-11-12 ENCOUNTER — Inpatient Hospital Stay (HOSPITAL_COMMUNITY): Payer: Medicaid Other

## 2015-11-12 DIAGNOSIS — Z8249 Family history of ischemic heart disease and other diseases of the circulatory system: Secondary | ICD-10-CM

## 2015-11-12 DIAGNOSIS — Z3A32 32 weeks gestation of pregnancy: Secondary | ICD-10-CM

## 2015-11-12 DIAGNOSIS — O163 Unspecified maternal hypertension, third trimester: Secondary | ICD-10-CM | POA: Diagnosis present

## 2015-11-12 DIAGNOSIS — O09213 Supervision of pregnancy with history of pre-term labor, third trimester: Secondary | ICD-10-CM

## 2015-11-12 DIAGNOSIS — O09892 Supervision of other high risk pregnancies, second trimester: Secondary | ICD-10-CM

## 2015-11-12 DIAGNOSIS — K219 Gastro-esophageal reflux disease without esophagitis: Secondary | ICD-10-CM | POA: Diagnosis present

## 2015-11-12 DIAGNOSIS — O99323 Drug use complicating pregnancy, third trimester: Secondary | ICD-10-CM | POA: Diagnosis present

## 2015-11-12 DIAGNOSIS — R1013 Epigastric pain: Secondary | ICD-10-CM

## 2015-11-12 DIAGNOSIS — Z833 Family history of diabetes mellitus: Secondary | ICD-10-CM | POA: Diagnosis not present

## 2015-11-12 DIAGNOSIS — F1120 Opioid dependence, uncomplicated: Secondary | ICD-10-CM | POA: Diagnosis present

## 2015-11-12 DIAGNOSIS — F17210 Nicotine dependence, cigarettes, uncomplicated: Secondary | ICD-10-CM | POA: Diagnosis present

## 2015-11-12 DIAGNOSIS — G8929 Other chronic pain: Secondary | ICD-10-CM

## 2015-11-12 DIAGNOSIS — K861 Other chronic pancreatitis: Secondary | ICD-10-CM | POA: Diagnosis present

## 2015-11-12 DIAGNOSIS — O99613 Diseases of the digestive system complicating pregnancy, third trimester: Principal | ICD-10-CM | POA: Diagnosis present

## 2015-11-12 DIAGNOSIS — O09899 Supervision of other high risk pregnancies, unspecified trimester: Secondary | ICD-10-CM

## 2015-11-12 DIAGNOSIS — Z825 Family history of asthma and other chronic lower respiratory diseases: Secondary | ICD-10-CM

## 2015-11-12 DIAGNOSIS — O99333 Smoking (tobacco) complicating pregnancy, third trimester: Secondary | ICD-10-CM | POA: Diagnosis present

## 2015-11-12 DIAGNOSIS — R748 Abnormal levels of other serum enzymes: Secondary | ICD-10-CM

## 2015-11-12 DIAGNOSIS — O09893 Supervision of other high risk pregnancies, third trimester: Secondary | ICD-10-CM

## 2015-11-12 DIAGNOSIS — O09212 Supervision of pregnancy with history of pre-term labor, second trimester: Secondary | ICD-10-CM

## 2015-11-12 DIAGNOSIS — R1010 Upper abdominal pain, unspecified: Secondary | ICD-10-CM | POA: Diagnosis present

## 2015-11-12 LAB — URINE MICROSCOPIC-ADD ON

## 2015-11-12 LAB — AMYLASE: AMYLASE: 207 U/L — AB (ref 28–100)

## 2015-11-12 LAB — COMPREHENSIVE METABOLIC PANEL
ALK PHOS: 126 U/L (ref 38–126)
ALT: 11 U/L — AB (ref 14–54)
ANION GAP: 11 (ref 5–15)
AST: 15 U/L (ref 15–41)
Albumin: 2.8 g/dL — ABNORMAL LOW (ref 3.5–5.0)
BILIRUBIN TOTAL: 0.3 mg/dL (ref 0.3–1.2)
BUN: 7 mg/dL (ref 6–20)
CALCIUM: 9.2 mg/dL (ref 8.9–10.3)
CO2: 20 mmol/L — AB (ref 22–32)
CREATININE: 0.59 mg/dL (ref 0.44–1.00)
Chloride: 102 mmol/L (ref 101–111)
Glucose, Bld: 96 mg/dL (ref 65–99)
Potassium: 4.3 mmol/L (ref 3.5–5.1)
SODIUM: 133 mmol/L — AB (ref 135–145)
TOTAL PROTEIN: 6.5 g/dL (ref 6.5–8.1)

## 2015-11-12 LAB — CBC
HEMATOCRIT: 32.4 % — AB (ref 36.0–46.0)
HEMOGLOBIN: 10.7 g/dL — AB (ref 12.0–15.0)
MCH: 27.8 pg (ref 26.0–34.0)
MCHC: 33 g/dL (ref 30.0–36.0)
MCV: 84.2 fL (ref 78.0–100.0)
Platelets: 309 10*3/uL (ref 150–400)
RBC: 3.85 MIL/uL — AB (ref 3.87–5.11)
RDW: 15.3 % (ref 11.5–15.5)
WBC: 14.9 10*3/uL — ABNORMAL HIGH (ref 4.0–10.5)

## 2015-11-12 LAB — LIPASE, BLOOD: LIPASE: 255 U/L — AB (ref 11–51)

## 2015-11-12 LAB — URINALYSIS, ROUTINE W REFLEX MICROSCOPIC
Bilirubin Urine: NEGATIVE
GLUCOSE, UA: NEGATIVE mg/dL
KETONES UR: NEGATIVE mg/dL
LEUKOCYTES UA: NEGATIVE
NITRITE: NEGATIVE
PROTEIN: NEGATIVE mg/dL
Specific Gravity, Urine: 1.01 (ref 1.005–1.030)
pH: 6 (ref 5.0–8.0)

## 2015-11-12 LAB — RAPID URINE DRUG SCREEN, HOSP PERFORMED
AMPHETAMINES: NOT DETECTED
BENZODIAZEPINES: NOT DETECTED
Barbiturates: NOT DETECTED
Cocaine: NOT DETECTED
Opiates: NOT DETECTED
TETRAHYDROCANNABINOL: NOT DETECTED

## 2015-11-12 LAB — TYPE AND SCREEN
ABO/RH(D): A NEG
Antibody Screen: NEGATIVE

## 2015-11-12 MED ORDER — HYDROMORPHONE HCL 1 MG/ML IJ SOLN
1.0000 mg | Freq: Once | INTRAMUSCULAR | Status: AC
  Administered 2015-11-12: 1 mg via INTRAVENOUS
  Filled 2015-11-12: qty 1

## 2015-11-12 MED ORDER — LACTATED RINGERS IV SOLN
INTRAVENOUS | Status: DC
  Administered 2015-11-12 – 2015-11-14 (×5): via INTRAVENOUS
  Administered 2015-11-15 – 2015-11-16 (×2): 150 mL/h via INTRAVENOUS
  Administered 2015-11-16 – 2015-11-19 (×11): via INTRAVENOUS

## 2015-11-12 MED ORDER — ONDANSETRON HCL 4 MG/2ML IJ SOLN: 4.0000 mg | Freq: Four times a day (QID) | INTRAMUSCULAR | Status: DC | PRN

## 2015-11-12 MED ORDER — PROMETHAZINE HCL 25 MG/ML IJ SOLN
25.0000 mg | Freq: Once | INTRAVENOUS | Status: AC
  Administered 2015-11-12: 25 mg via INTRAVENOUS
  Filled 2015-11-12: qty 1

## 2015-11-12 MED ORDER — SODIUM CHLORIDE 0.9% FLUSH: 9.0000 mL | INTRAVENOUS | Status: DC | PRN

## 2015-11-12 MED ORDER — DOCUSATE SODIUM 100 MG PO CAPS
100.0000 mg | ORAL_CAPSULE | Freq: Every day | ORAL | Status: DC
  Administered 2015-11-13 – 2015-11-19 (×5): 100 mg via ORAL
  Filled 2015-11-12 (×6): qty 1

## 2015-11-12 MED ORDER — PROMETHAZINE HCL 25 MG/ML IJ SOLN
12.5000 mg | INTRAMUSCULAR | Status: DC | PRN
  Administered 2015-11-12: 25 mg via INTRAVENOUS
  Administered 2015-11-13 – 2015-11-14 (×3): 12.5 mg via INTRAVENOUS
  Administered 2015-11-14: 25 mg via INTRAVENOUS
  Administered 2015-11-16 (×2): 12.5 mg via INTRAVENOUS
  Administered 2015-11-16: 25 mg via INTRAVENOUS
  Administered 2015-11-16: 12.5 mg via INTRAVENOUS
  Administered 2015-11-17: 25 mg via INTRAVENOUS
  Administered 2015-11-17: 12.5 mg via INTRAVENOUS
  Administered 2015-11-17 – 2015-11-18 (×3): 25 mg via INTRAVENOUS
  Administered 2015-11-19: 12.5 mg via INTRAVENOUS
  Filled 2015-11-12 (×15): qty 1

## 2015-11-12 MED ORDER — NALOXONE HCL 0.4 MG/ML IJ SOLN: 0.4000 mg | INTRAMUSCULAR | Status: DC | PRN

## 2015-11-12 MED ORDER — ZOLPIDEM TARTRATE 5 MG PO TABS
5.0000 mg | ORAL_TABLET | Freq: Every evening | ORAL | Status: DC | PRN
  Administered 2015-11-12 – 2015-11-18 (×7): 5 mg via ORAL
  Filled 2015-11-12 (×7): qty 1

## 2015-11-12 MED ORDER — PRENATAL MULTIVITAMIN CH
1.0000 | ORAL_TABLET | Freq: Every day | ORAL | Status: DC
  Filled 2015-11-12: qty 1

## 2015-11-12 MED ORDER — HYDROMORPHONE 1 MG/ML IV SOLN
INTRAVENOUS | Status: DC
  Administered 2015-11-12: 3.2 mg via INTRAVENOUS
  Administered 2015-11-12: 20:00:00 via INTRAVENOUS
  Administered 2015-11-13: 4.5 mg via INTRAVENOUS
  Administered 2015-11-13: 4.2 mg via INTRAVENOUS
  Filled 2015-11-12: qty 25

## 2015-11-12 MED ORDER — DIPHENHYDRAMINE HCL 12.5 MG/5ML PO ELIX
12.5000 mg | ORAL_SOLUTION | Freq: Four times a day (QID) | ORAL | Status: DC | PRN
  Filled 2015-11-12: qty 5

## 2015-11-12 MED ORDER — ACETAMINOPHEN 325 MG PO TABS: 650.0000 mg | ORAL_TABLET | ORAL | Status: DC | PRN

## 2015-11-12 MED ORDER — DIPHENHYDRAMINE HCL 50 MG/ML IJ SOLN: 12.5000 mg | Freq: Four times a day (QID) | INTRAMUSCULAR | Status: DC | PRN

## 2015-11-12 MED ORDER — CALCIUM CARBONATE ANTACID 500 MG PO CHEW: 2.0000 | CHEWABLE_TABLET | ORAL | Status: DC | PRN
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
## 2015-11-12 NOTE — Progress Notes (Signed)
Cigarette smell coming out of pt's rm into hallway. Went into pt rm, found pt coming out of BR. Asked pt if anyone was smoking in the room. Pt denied and stated FOB had just come from smoking outside. Educated pt on the importance of not smoking the rm. House coverage notified.  HC in to talk to pt and  FOB.
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
## 2015-11-12 NOTE — Progress Notes (Signed)
   11/12/15 2205  Provider Notification  Provider Name/Title Christin Bach MD  Method of Notification In department  Pt on full dose dilaudid PCA, had phenergan IV per request. Pt also requesting for ambien. Told pt all these meds may me too much for her and will run it by Dr. Emelda Fear. Dr. Emelda Fear stated to given Remus Loffler an hr after phenergan. Pt notified

## 2015-11-12 NOTE — Progress Notes (Signed)
Dr Vergie Living notified of pt c/o pain 10/10. Pt received two IV doses of dilaudid. Dr Pickens/Dr Emelda Fear will come evaluate pt.

## 2015-11-12 NOTE — MAU Note (Signed)
abd pain - top of abd through to back up into chest. Woke her at 11. Worst it has been in a long time.  Nausea and vomiting.

## 2015-11-13 LAB — CBC
HEMATOCRIT: 28.8 % — AB (ref 36.0–46.0)
Hemoglobin: 9.2 g/dL — ABNORMAL LOW (ref 12.0–15.0)
MCH: 27.3 pg (ref 26.0–34.0)
MCHC: 31.9 g/dL (ref 30.0–36.0)
MCV: 85.5 fL (ref 78.0–100.0)
Platelets: 252 10*3/uL (ref 150–400)
RBC: 3.37 MIL/uL — ABNORMAL LOW (ref 3.87–5.11)
RDW: 15.5 % (ref 11.5–15.5)
WBC: 14.9 10*3/uL — ABNORMAL HIGH (ref 4.0–10.5)

## 2015-11-13 LAB — COMPREHENSIVE METABOLIC PANEL
ALBUMIN: 2.5 g/dL — AB (ref 3.5–5.0)
ALK PHOS: 115 U/L (ref 38–126)
ALT: 10 U/L — ABNORMAL LOW (ref 14–54)
AST: 13 U/L — AB (ref 15–41)
Anion gap: 7 (ref 5–15)
BILIRUBIN TOTAL: 0.3 mg/dL (ref 0.3–1.2)
BUN: 7 mg/dL (ref 6–20)
CALCIUM: 8.4 mg/dL — AB (ref 8.9–10.3)
CO2: 23 mmol/L (ref 22–32)
Chloride: 104 mmol/L (ref 101–111)
Creatinine, Ser: 0.44 mg/dL (ref 0.44–1.00)
GFR calc Af Amer: >60 mL/min (ref >60)
GFR calc non Af Amer: >60 mL/min (ref >60)
GLUCOSE: 113 mg/dL — AB (ref 65–99)
Potassium: 3.9 mmol/L (ref 3.5–5.1)
Sodium: 134 mmol/L — ABNORMAL LOW (ref 135–145)
TOTAL PROTEIN: 5.6 g/dL — AB (ref 6.5–8.1)

## 2015-11-13 LAB — LIPASE, BLOOD: Lipase: 414 U/L — ABNORMAL HIGH (ref 11–51)

## 2015-11-13 MED ORDER — DIPHENHYDRAMINE HCL 12.5 MG/5ML PO ELIX
12.5000 mg | ORAL_SOLUTION | Freq: Four times a day (QID) | ORAL | Status: DC | PRN
  Administered 2015-11-14: 12.5 mg via ORAL
  Filled 2015-11-13 (×5): qty 5

## 2015-11-13 MED ORDER — SERTRALINE HCL 50 MG PO TABS
50.0000 mg | ORAL_TABLET | Freq: Every day | ORAL | Status: DC
  Administered 2015-11-13 – 2015-11-18 (×6): 50 mg via ORAL
  Filled 2015-11-13 (×7): qty 1

## 2015-11-13 MED ORDER — PRENATAL MULTIVITAMIN CH
1.0000 | ORAL_TABLET | Freq: Every day | ORAL | Status: DC
  Administered 2015-11-13 – 2015-11-18 (×6): 1 via ORAL
  Filled 2015-11-13 (×6): qty 1

## 2015-11-13 MED ORDER — ONDANSETRON 4 MG PO TBDP
8.0000 mg | ORAL_TABLET | Freq: Three times a day (TID) | ORAL | Status: DC | PRN
  Administered 2015-11-15 – 2015-11-19 (×6): 8 mg via ORAL
  Filled 2015-11-13 (×7): qty 2

## 2015-11-13 MED ORDER — HYDROMORPHONE 1 MG/ML IV SOLN
INTRAVENOUS | Status: DC
  Administered 2015-11-13: 6 mg via INTRAVENOUS
  Administered 2015-11-13: 7.5 mg via INTRAVENOUS
  Administered 2015-11-13: 6.3 mg via INTRAVENOUS
  Administered 2015-11-13: 12:00:00 via INTRAVENOUS
  Administered 2015-11-14: 2.1 mg via INTRAVENOUS
  Administered 2015-11-14: 20:00:00 via INTRAVENOUS
  Administered 2015-11-14: 6.6 mg via INTRAVENOUS
  Administered 2015-11-14: 04:00:00 via INTRAVENOUS
  Administered 2015-11-14: 6.9 mg via INTRAVENOUS
  Administered 2015-11-14: 7.2 mg via INTRAVENOUS
  Administered 2015-11-14: 3.9 mg via INTRAVENOUS
  Administered 2015-11-14: 6.3 mg via INTRAVENOUS
  Administered 2015-11-15: 2.7 mg via INTRAVENOUS
  Administered 2015-11-15: 6.6 mg via INTRAVENOUS
  Administered 2015-11-15: 3.9 mg via INTRAVENOUS
  Filled 2015-11-13 (×3): qty 25

## 2015-11-13 MED ORDER — SODIUM CHLORIDE 0.9% FLUSH: 9.0000 mL | INTRAVENOUS | Status: DC | PRN

## 2015-11-13 MED ORDER — ONDANSETRON HCL 4 MG/2ML IJ SOLN
4.0000 mg | Freq: Four times a day (QID) | INTRAMUSCULAR | Status: DC | PRN
  Administered 2015-11-14: 4 mg via INTRAVENOUS
  Filled 2015-11-13: qty 2

## 2015-11-13 MED ORDER — NALOXONE HCL 0.4 MG/ML IJ SOLN: 0.4000 mg | INTRAMUSCULAR | Status: DC | PRN

## 2015-11-13 MED ORDER — DIPHENHYDRAMINE HCL 50 MG/ML IJ SOLN
12.5000 mg | Freq: Four times a day (QID) | INTRAMUSCULAR | Status: DC | PRN
  Administered 2015-11-14 (×2): 12.5 mg via INTRAVENOUS
  Filled 2015-11-13 (×2): qty 1

## 2015-11-13 NOTE — Progress Notes (Signed)
Pt offered pain meds, denies need @ present.

## 2015-11-13 NOTE — Progress Notes (Addendum)
Patient ID: [name], female   DOB: 05-30-1985, 30 y.o.   MRN: [id number] FACULTY PRACTICE ANTEPARTUM(COMPREHENSIVE) NOTE  [name] is a 30 y.o. G3P0201 at [redacted]w[redacted]d by  who is admitted for chronic pancreatitis with elevated lipase to 200's.   Fetal presentation is cephalic. Length of Stay:  1  Days  Subjective: Pt still rates pain a 7-8,  Patient reports the fetal movement as active. Patient reports uterine contraction  activity as none. Patient reports  vaginal bleeding as none. Patient describes fluid per vagina as None.  Vitals:  Blood pressure (!) 126/94, pulse (!) 101, temperature 97.9 F (36.6 C), temperature source Oral, resp. rate 20, height 5\' 4"  (1.626 m), weight 72.6 kg (160 lb), last menstrual period 04/04/2015, SpO2 100 %, unknown if currently breastfeeding. Physical Examination:  General appearance - oriented to person, place, and time, normal appearing weight, in mild to moderate distress, ill-appearing and chronically ill appearing Heart - normal rate and regular rhythm Abdomen - soft, nontender, nondistended Fundal Height:  size equals dates Cervical Exam: Not evaluated. a Extremities: extremities normal, atraumatic, no cyanosis or edema and Homans sign is negative, no sign of DVT with DTRs 2+ bilaterally Membranes:intact  Fetal Monitoring:  Bid no contractions normal fhr  Labs:  Results for orders placed or performed during the hospital encounter of 11/12/15 (from the past 24 hour(s))  Urinalysis, Routine w reflex microscopic (not at [facility])   Collection Time: 11/12/15  1:45 PM  Result Value Ref Range   Color, Urine YELLOW YELLOW   APPearance CLEAR CLEAR   Specific Gravity, Urine 1.010 1.005 - 1.030   pH 6.0 5.0 - 8.0   Glucose, UA NEGATIVE NEGATIVE mg/dL   Hgb urine dipstick TRACE (A) NEGATIVE   Bilirubin Urine NEGATIVE NEGATIVE   Ketones, ur NEGATIVE NEGATIVE mg/dL   Protein, ur NEGATIVE NEGATIVE mg/dL   Nitrite NEGATIVE NEGATIVE   Leukocytes, UA  NEGATIVE NEGATIVE  Urine microscopic-add on   Collection Time: 11/12/15  1:45 PM  Result Value Ref Range   Squamous Epithelial / LPF 0-5 (A) NONE SEEN   WBC, UA 0-5 0 - 5 WBC/hpf   RBC / HPF 0-5 0 - 5 RBC/hpf   Bacteria, UA RARE (A) NONE SEEN  Urine rapid drug screen (hosp performed)   Collection Time: 11/12/15  1:45 PM  Result Value Ref Range   Opiates NONE DETECTED NONE DETECTED   Cocaine NONE DETECTED NONE DETECTED   Benzodiazepines NONE DETECTED NONE DETECTED   Amphetamines NONE DETECTED NONE DETECTED   Tetrahydrocannabinol NONE DETECTED NONE DETECTED   Barbiturates NONE DETECTED NONE DETECTED  CBC   Collection Time: 11/12/15  1:56 PM  Result Value Ref Range   WBC 14.9 (H) 4.0 - 10.5 K/uL   RBC 3.85 (L) 3.87 - 5.11 MIL/uL   Hemoglobin 10.7 (L) 12.0 - 15.0 g/dL   HCT 47.8 (L) 29.5 - 62.1 %   MCV 84.2 78.0 - 100.0 fL   MCH 27.8 26.0 - 34.0 pg   MCHC 33.0 30.0 - 36.0 g/dL   RDW 30.8 65.7 - 84.6 %   Platelets 309 150 - 400 K/uL  Comprehensive metabolic panel   Collection Time: 11/12/15  1:56 PM  Result Value Ref Range   Sodium 133 (L) 135 - 145 mmol/L   Potassium 4.3 3.5 - 5.1 mmol/L   Chloride 102 101 - 111 mmol/L   CO2 20 (L) 22 - 32 mmol/L   Glucose, Bld 96 65 - 99 mg/dL  BUN 7 6 - 20 mg/dL   Creatinine, Ser 7.90 0.44 - 1.00 mg/dL   Calcium 9.2 8.9 - 38.3 mg/dL   Total Protein 6.5 6.5 - 8.1 g/dL   Albumin 2.8 (L) 3.5 - 5.0 g/dL   AST 15 15 - 41 U/L   ALT 11 (L) 14 - 54 U/L   Alkaline Phosphatase 126 38 - 126 U/L   Total Bilirubin 0.3 0.3 - 1.2 mg/dL   GFR calc non Af Amer >60 >60 mL/min   GFR calc Af Amer >60 >60 mL/min   Anion gap 11 5 - 15  Lipase, blood   Collection Time: 11/12/15  1:56 PM  Result Value Ref Range   Lipase 255 (H) 11 - 51 U/L  Amylase   Collection Time: 11/12/15  1:56 PM  Result Value Ref Range   Amylase 207 (H) 28 - 100 U/L  Type and screen [facility]   Collection Time: 11/12/15  2:01 PM  Result Value Ref Range    ABO/RH(D) A NEG    Antibody Screen NEG    Sample Expiration 11/15/2015   Lipase, blood   Collection Time: 11/13/15  6:01 AM  Result Value Ref Range   Lipase 414 (H) 11 - 51 U/L  Comprehensive metabolic panel   Collection Time: 11/13/15  6:01 AM  Result Value Ref Range   Sodium 134 (L) 135 - 145 mmol/L   Potassium 3.9 3.5 - 5.1 mmol/L   Chloride 104 101 - 111 mmol/L   CO2 23 22 - 32 mmol/L   Glucose, Bld 113 (H) 65 - 99 mg/dL   BUN 7 6 - 20 mg/dL   Creatinine, Ser 3.38 0.44 - 1.00 mg/dL   Calcium 8.4 (L) 8.9 - 10.3 mg/dL   Total Protein 5.6 (L) 6.5 - 8.1 g/dL   Albumin 2.5 (L) 3.5 - 5.0 g/dL   AST 13 (L) 15 - 41 U/L   ALT 10 (L) 14 - 54 U/L   Alkaline Phosphatase 115 38 - 126 U/L   Total Bilirubin 0.3 0.3 - 1.2 mg/dL   GFR calc non Af Amer >60 >60 mL/min   GFR calc Af Amer >60 >60 mL/min   Anion gap 7 5 - 15  CBC   Collection Time: 11/13/15  6:01 AM  Result Value Ref Range   WBC 14.9 (H) 4.0 - 10.5 K/uL   RBC 3.37 (L) 3.87 - 5.11 MIL/uL   Hemoglobin 9.2 (L) 12.0 - 15.0 g/dL   HCT 32.9 (L) 19.1 - 66.0 %   MCV 85.5 78.0 - 100.0 fL   MCH 27.3 26.0 - 34.0 pg   MCHC 31.9 30.0 - 36.0 g/dL   RDW 60.0 45.9 - 97.7 %   Platelets 252 150 - 400 K/uL    Imaging Studies:     Currently EPIC will not allow sonographic studies to automatically populate into notes.  In the meantime, copy and paste results into note or free text.  Medications:  Scheduled . docusate sodium  100 mg Oral Daily  . HYDROmorphone   Intravenous Q4H  . prenatal multivitamin  1 tablet Oral Q1200   I have reviewed the patient's current medications.  ASSESSMENT: Patient Active Problem List   Diagnosis Date Noted  . History of preterm delivery, currently pregnant 05/05/2015    Priority: Medium  . Prior pregnancy with fetal demise, antepartum 06/25/2014    Priority: Medium  . Supervision of other high-risk pregnancy 05/19/2015    Priority: Low  .  Chronic relapsing pancreatitis (HCC) 07/04/2014     Priority: Low  . Pap smear abnormality of cervix with ASCUS favoring dysplasia 04/08/2014    Priority: Low  . Chronic recurrent pancreatitis (HCC) 11/12/2015  . Substance abuse affecting pregnancy, antepartum 11/05/2015  . Pancreatitis, acute 09/26/2015  . Hyperemesis gravidarum 06/01/2015  . ASCUS with positive high risk HPV cervical 05/24/2015  . Preterm delivery 10/14/2014  . Depression with anxiety 10/14/2014  . History of placenta abruption 08/04/2014  . BV (bacterial vaginosis) 07/24/2014  . Rh negative state in antepartum period 02/13/2014  . Marijuana use 02/13/2014  . Hematemesis 11/04/2012  . Sleep disturbance 08/01/2012  . Generalized anxiety disorder 08/01/2012  . Opiate dependence (HCC) 02/27/2012  . Tobacco abuse 04/13/2011  . Pancreas divisum 07/23/2010  . Anemia 07/23/2010    PLAN: 1. Increase PCA to custom dose to prevent lockout., 2 mg/hr max dosing 2. Daily lipase, cbc 3. Add bland diet begin with dinner. 4 cbc with diff, lipase q day x 3 5 consider GI consult Lamika Connolly V 11/13/2015,9:06 AM

## 2015-11-14 LAB — CBC WITH DIFFERENTIAL/PLATELET
BASOS ABS: 0 10*3/uL (ref 0.0–0.1)
Basophils Relative: 0 %
EOS PCT: 2 %
Eosinophils Absolute: 0.3 10*3/uL (ref 0.0–0.7)
HCT: 31 % — ABNORMAL LOW (ref 36.0–46.0)
HEMOGLOBIN: 10 g/dL — AB (ref 12.0–15.0)
LYMPHS PCT: 16 %
Lymphs Abs: 1.8 10*3/uL (ref 0.7–4.0)
MCH: 27.7 pg (ref 26.0–34.0)
MCHC: 32.3 g/dL (ref 30.0–36.0)
MCV: 85.9 fL (ref 78.0–100.0)
Monocytes Absolute: 0.9 10*3/uL (ref 0.1–1.0)
Monocytes Relative: 8 %
NEUTROS ABS: 8.1 10*3/uL — AB (ref 1.7–7.7)
Neutrophils Relative %: 74 %
PLATELETS: 250 10*3/uL (ref 150–400)
RBC: 3.61 MIL/uL — AB (ref 3.87–5.11)
RDW: 15.3 % (ref 11.5–15.5)
WBC: 11.1 10*3/uL — ABNORMAL HIGH (ref 4.0–10.5)

## 2015-11-14 LAB — COMPREHENSIVE METABOLIC PANEL
ALBUMIN: 2.8 g/dL — AB (ref 3.5–5.0)
ALK PHOS: 121 U/L (ref 38–126)
ALT: 12 U/L — AB (ref 14–54)
AST: 19 U/L (ref 15–41)
Anion gap: 7 (ref 5–15)
BUN: <5 mg/dL — ABNORMAL LOW (ref 6–20)
CALCIUM: 8.6 mg/dL — AB (ref 8.9–10.3)
CHLORIDE: 100 mmol/L — AB (ref 101–111)
CO2: 26 mmol/L (ref 22–32)
CREATININE: 0.4 mg/dL — AB (ref 0.44–1.00)
GFR calc Af Amer: >60 mL/min (ref >60)
GFR calc non Af Amer: >60 mL/min (ref >60)
GLUCOSE: 79 mg/dL (ref 65–99)
Potassium: 3.8 mmol/L (ref 3.5–5.1)
SODIUM: 133 mmol/L — AB (ref 135–145)
Total Bilirubin: 0.4 mg/dL (ref 0.3–1.2)
Total Protein: 7 g/dL (ref 6.5–8.1)

## 2015-11-14 LAB — LIPASE, BLOOD: Lipase: 161 U/L — ABNORMAL HIGH (ref 11–51)

## 2015-11-14 NOTE — Progress Notes (Signed)
Patient ID: [name], female   DOB: [birth year], 30 y.o.   MRN: [id number] FACULTY PRACTICE ANTEPARTUM(COMPREHENSIVE) NOTE  [name] is a 30 y.o. G3P0201 at [redacted]w[redacted]d by  who is admitted for chronic pancreatitis with elevated lipase to 200's.   Fetal presentation is cephalic. Length of Stay:  2  Days  Subjective: Patient reports some improvement in her pain. She is tolerating po intake  Patient reports the fetal movement as active. Patient reports uterine contraction  activity as none. Patient reports  vaginal bleeding as none. Patient describes fluid per vagina as None.  Vitals:  Blood pressure 127/86, pulse 85, temperature 97.9 F (36.6 C), temperature source Oral, resp. rate 12, height  (1.626 m), weight 160 lb (72.6 kg), last menstrual period 04/04/2015, SpO2 99 %, unknown if currently breastfeeding. Physical Examination:  General appearance - in mild to moderate distress, ill-appearing and chronically ill appearing Heart - normal rate and regular rhythm Abdomen - soft, nontender, gravid Fundal Height:  size equals dates Cervical Exam: Not evaluated Extremities: extremities normal, atraumatic, no cyanosis or edema and Homans sign is negative, no sign of DVT with DTRs 2+ bilaterally Membranes:intact  Fetal Monitoring:  Baseline 140, mod variability, +accels, no decels Toco: no contractions  Labs:  Results for orders placed or performed during the hospital encounter of 11/12/15 (from the past 24 hour(s))  Lipase, blood   Collection Time: 11/14/15  5:12 AM  Result Value Ref Range   Lipase 161 (H) 11 - 51 U/L  Comprehensive metabolic panel   Collection Time: 11/14/15  5:12 AM  Result Value Ref Range   Sodium 133 (L) 135 - 145 mmol/L   Potassium 3.8 3.5 - 5.1 mmol/L   Chloride 100 (L) 101 - 111 mmol/L   CO2 26 22 - 32 mmol/L   Glucose, Bld 79 65 - 99 mg/dL   BUN <5 (L) 6 - 20 mg/dL   Creatinine, Ser 4.03 (L) 0.44 - 1.00 mg/dL   Calcium 8.6 (L) 8.9 - 10.3 mg/dL   Total Protein 7.0 6.5 - 8.1 g/dL   Albumin 2.8 (L) 3.5 - 5.0 g/dL   AST 19 15 - 41 U/L   ALT 12 (L) 14 - 54 U/L   Alkaline Phosphatase 121 38 - 126 U/L   Total Bilirubin 0.4 0.3 - 1.2 mg/dL   GFR calc non Af Amer >60 >60 mL/min   GFR calc Af Amer >60 >60 mL/min   Anion gap 7 5 - 15  CBC with Differential/Platelet   Collection Time: 11/14/15  5:12 AM  Result Value Ref Range   WBC 11.1 (H) 4.0 - 10.5 K/uL   RBC 3.61 (L) 3.87 - 5.11 MIL/uL   Hemoglobin 10.0 (L) 12.0 - 15.0 g/dL   HCT 47.4 (L) 25.9 - 56.3 %   MCV 85.9 78.0 - 100.0 fL   MCH 27.7 26.0 - 34.0 pg   MCHC 32.3 30.0 - 36.0 g/dL   RDW 87.5 64.3 - 32.9 %   Platelets 250 150 - 400 K/uL   Neutrophils Relative % 74 %   Neutro Abs 8.1 (H) 1.7 - 7.7 K/uL   Lymphocytes Relative 16 %   Lymphs Abs 1.8 0.7 - 4.0 K/uL   Monocytes Relative 8 %   Monocytes Absolute 0.9 0.1 - 1.0 K/uL   Eosinophils Relative 2 %   Eosinophils Absolute 0.3 0.0 - 0.7 K/uL   Basophils Relative 0 %   Basophils Absolute 0.0 0.0 - 0.1 K/uL  Imaging Studies:     Currently EPIC will not allow sonographic studies to automatically populate into notes.  In the meantime, copy and paste results into note or free text.  Medications:  Scheduled . docusate sodium  100 mg Oral Daily  . HYDROmorphone   Intravenous Q4H  . prenatal multivitamin  1 tablet Oral QHS  . sertraline  50 mg Oral QHS   I have reviewed the patient's current medications.  ASSESSMENT: Patient Active Problem List   Diagnosis Date Noted  . Chronic recurrent pancreatitis (HCC) 11/12/2015  . Substance abuse affecting pregnancy, antepartum 11/05/2015  . Pancreatitis, acute 09/26/2015  . Hyperemesis gravidarum 06/01/2015  . ASCUS with positive high risk HPV cervical 05/24/2015  . Supervision of other high-risk pregnancy 05/19/2015  . History of preterm delivery, currently pregnant 05/05/2015  . Preterm delivery 10/14/2014  . Depression with anxiety 10/14/2014  . History of placenta  abruption 08/04/2014  . BV (bacterial vaginosis) 07/24/2014  . Chronic relapsing pancreatitis (HCC) 07/04/2014  . Prior pregnancy with fetal demise, antepartum 06/25/2014  . Pap smear abnormality of cervix with ASCUS favoring dysplasia 04/08/2014  . Rh negative state in antepartum period 02/13/2014  . Marijuana use 02/13/2014  . Hematemesis 11/04/2012  . Sleep disturbance 08/01/2012  . Generalized anxiety disorder 08/01/2012  . Opiate dependence (HCC) 02/27/2012  . Tobacco abuse 04/13/2011  . Pancreas divisum 07/23/2010  . Anemia 07/23/2010    PLAN: 30 yo G3P0201 at 28 weeks with chronic pancreatitis  Continue pain management Lipase slowly trending down Continue current antepartum care GI consult if no improvement  Claryssa Sandner 11/14/2015,7:42 AM

## 2015-11-15 DIAGNOSIS — Z3A32 32 weeks gestation of pregnancy: Secondary | ICD-10-CM

## 2015-11-15 DIAGNOSIS — O99613 Diseases of the digestive system complicating pregnancy, third trimester: Principal | ICD-10-CM

## 2015-11-15 DIAGNOSIS — K861 Other chronic pancreatitis: Secondary | ICD-10-CM

## 2015-11-15 LAB — CBC WITH DIFFERENTIAL/PLATELET
BASOS ABS: 0 10*3/uL (ref 0.0–0.1)
Basophils Relative: 0 %
EOS PCT: 3 %
Eosinophils Absolute: 0.3 10*3/uL (ref 0.0–0.7)
HEMATOCRIT: 27.4 % — AB (ref 36.0–46.0)
Hemoglobin: 9 g/dL — ABNORMAL LOW (ref 12.0–15.0)
LYMPHS ABS: 2.1 10*3/uL (ref 0.7–4.0)
LYMPHS PCT: 22 %
MCH: 27.8 pg (ref 26.0–34.0)
MCHC: 32.8 g/dL (ref 30.0–36.0)
MCV: 84.6 fL (ref 78.0–100.0)
MONO ABS: 0.6 10*3/uL (ref 0.1–1.0)
Monocytes Relative: 6 %
NEUTROS ABS: 6.8 10*3/uL (ref 1.7–7.7)
Neutrophils Relative %: 69 %
PLATELETS: 229 10*3/uL (ref 150–400)
RBC: 3.24 MIL/uL — AB (ref 3.87–5.11)
RDW: 15.3 % (ref 11.5–15.5)
WBC: 9.9 10*3/uL (ref 4.0–10.5)

## 2015-11-15 LAB — LIPASE, BLOOD
Lipase: 75 U/L — ABNORMAL HIGH (ref 11–51)
Lipase: 120 U/L — ABNORMAL HIGH (ref 11–51)

## 2015-11-15 MED ORDER — HYDROMORPHONE HCL 2 MG PO TABS
2.0000 mg | ORAL_TABLET | ORAL | Status: DC | PRN
  Administered 2015-11-15: 2 mg via ORAL
  Administered 2015-11-15: 4 mg via ORAL
  Administered 2015-11-15: 2 mg via ORAL
  Administered 2015-11-15 – 2015-11-16 (×4): 4 mg via ORAL
  Filled 2015-11-15: qty 1
  Filled 2015-11-15 (×4): qty 2
  Filled 2015-11-15: qty 1
  Filled 2015-11-15: qty 2

## 2015-11-15 MED ORDER — HYDROMORPHONE HCL 1 MG/ML IJ SOLN
1.0000 mg | Freq: Once | INTRAMUSCULAR | Status: AC
  Administered 2015-11-15: 1 mg via INTRAVENOUS
  Filled 2015-11-15: qty 1

## 2015-11-15 NOTE — Progress Notes (Signed)
Patient ID: AADVIKA Dunn, female   DOB: 1986-03-09, 30 y.o.   MRN: [id number] FACULTY PRACTICE ANTEPARTUM(COMPREHENSIVE) NOTE  [name] is a 30 y.o. G3P0201 at [redacted]w[redacted]d who is admitted for chronic pancreatitis with elevated lipase to 400's.   Fetal presentation is cephalic. Length of Stay:  3  Days  Subjective: Patient reports some improvement in her pain. She is tolerating oral intake  Patient reports the fetal movement as active. Patient reports uterine contraction  activity as none. Patient reports  vaginal bleeding as none. Patient describes fluid per vagina as None.  Vitals:  Blood pressure 129/82, pulse (!) 102, temperature 98.4 F (36.9 C), temperature source Oral, resp. rate 14, height  (1.626 m), weight 160 lb (72.6 kg), last menstrual period 04/04/2015, SpO2 97 %, unknown if currently breastfeeding. Physical Examination:  General appearance - in mild to moderate distress, ill-appearing and chronically ill appearing Heart - normal rate and regular rhythm Abdomen - soft, nontender, gravid Fundal Height:  size equals dates Cervical Exam: Not evaluated Extremities: extremities normal, atraumatic, no cyanosis or edema and Homans sign is negative, no sign of DVT with DTRs 2+ bilaterally Membranes:intact  Fetal Monitoring:  Baseline 140, mod variability, +accels, no decels Toco: no contractions  Labs:  Results for orders placed or performed during the hospital encounter of 11/12/15 (from the past 24 hour(s))  CBC with Differential/Platelet   Collection Time: 11/15/15  5:46 AM  Result Value Ref Range   WBC 9.9 4.0 - 10.5 K/uL   RBC 3.24 (L) 3.87 - 5.11 MIL/uL   Hemoglobin 9.0 (L) 12.0 - 15.0 g/dL   HCT 40.9 (L) 81.1 - 91.4 %   MCV 84.6 78.0 - 100.0 fL   MCH 27.8 26.0 - 34.0 pg   MCHC 32.8 30.0 - 36.0 g/dL   RDW 78.2 95.6 - 21.3 %   Platelets 229 150 - 400 K/uL   Neutrophils Relative % 69 %   Neutro Abs 6.8 1.7 - 7.7 K/uL   Lymphocytes Relative 22 %   Lymphs Abs  2.1 0.7 - 4.0 K/uL   Monocytes Relative 6 %   Monocytes Absolute 0.6 0.1 - 1.0 K/uL   Eosinophils Relative 3 %   Eosinophils Absolute 0.3 0.0 - 0.7 K/uL   Basophils Relative 0 %   Basophils Absolute 0.0 0.0 - 0.1 K/uL  Lipase, blood   Collection Time: 11/15/15  5:46 AM  Result Value Ref Range   Lipase 75 (H) 11 - 51 U/L   Imaging Studies:     Currently EPIC will not allow sonographic studies to automatically populate into notes.  In the meantime, copy and paste results into note or free text.  Medications:  Scheduled . docusate sodium  100 mg Oral Daily  . prenatal multivitamin  1 tablet Oral QHS  . sertraline  50 mg Oral QHS   I have reviewed the patient's current medications.  ASSESSMENT: Patient Active Problem List   Diagnosis Date Noted  . Chronic recurrent pancreatitis (HCC) 11/12/2015  . Substance abuse affecting pregnancy, antepartum 11/05/2015  . Pancreatitis, acute 09/26/2015  . Hyperemesis gravidarum 06/01/2015  . ASCUS with positive high risk HPV cervical 05/24/2015  . Supervision of other high-risk pregnancy 05/19/2015  . History of preterm delivery, currently pregnant 05/05/2015  . Preterm delivery 10/14/2014  . Depression with anxiety 10/14/2014  . History of placenta abruption 08/04/2014  . BV (bacterial vaginosis) 07/24/2014  . Chronic relapsing pancreatitis (HCC) 07/04/2014  . Prior pregnancy with fetal demise,  antepartum 06/25/2014  . Pap smear abnormality of cervix with ASCUS favoring dysplasia 04/08/2014  . Rh negative state in antepartum period 02/13/2014  . Marijuana use 02/13/2014  . Hematemesis 11/04/2012  . Sleep disturbance 08/01/2012  . Generalized anxiety disorder 08/01/2012  . Opiate dependence (HCC) 02/27/2012  . Tobacco abuse 04/13/2011  . Pancreas divisum 07/23/2010  . Anemia 07/23/2010    PLAN: 30 yo G3P0201 at [redacted]w[redacted]d with chronic pancreatitis Lipase slowly trending down Continue pain management; PCA was discontinued. Oral Dilaudid  ordered Continue current antepartum care Consider discharge tomorrow morning if pain controlled with no other complications  Adelita Hone A, MD 11/15/2015,8:53 AM

## 2015-11-16 LAB — CBC WITH DIFFERENTIAL/PLATELET
BASOS ABS: 0 10*3/uL (ref 0.0–0.1)
BASOS PCT: 0 %
Eosinophils Absolute: 0.3 10*3/uL (ref 0.0–0.7)
Eosinophils Relative: 3 %
HEMATOCRIT: 28.2 % — AB (ref 36.0–46.0)
HEMOGLOBIN: 9.3 g/dL — AB (ref 12.0–15.0)
LYMPHS PCT: 28 %
Lymphs Abs: 2.4 10*3/uL (ref 0.7–4.0)
MCH: 27.7 pg (ref 26.0–34.0)
MCHC: 33 g/dL (ref 30.0–36.0)
MCV: 83.9 fL (ref 78.0–100.0)
MONO ABS: 0.7 10*3/uL (ref 0.1–1.0)
Monocytes Relative: 8 %
NEUTROS ABS: 5.4 10*3/uL (ref 1.7–7.7)
NEUTROS PCT: 61 %
Platelets: 225 10*3/uL (ref 150–400)
RBC: 3.36 MIL/uL — AB (ref 3.87–5.11)
RDW: 15.2 % (ref 11.5–15.5)
WBC: 8.8 10*3/uL (ref 4.0–10.5)

## 2015-11-16 LAB — LIPASE, BLOOD: Lipase: 95 U/L — ABNORMAL HIGH (ref 11–51)

## 2015-11-16 MED ORDER — HYDROMORPHONE HCL 1 MG/ML IJ SOLN
1.0000 mg | Freq: Once | INTRAMUSCULAR | Status: AC
  Administered 2015-11-16: 1 mg via INTRAVENOUS

## 2015-11-16 MED ORDER — HYDROMORPHONE HCL 1 MG/ML IJ SOLN
1.0000 mg | INTRAMUSCULAR | Status: DC | PRN
  Administered 2015-11-16 – 2015-11-19 (×24): 1 mg via INTRAVENOUS
  Filled 2015-11-16 (×25): qty 1

## 2015-11-16 MED ORDER — HYDROMORPHONE HCL 1 MG/ML IJ SOLN
1.0000 mg | Freq: Once | INTRAMUSCULAR | Status: AC
  Administered 2015-11-16: 1 mg via INTRAVENOUS
  Filled 2015-11-16: qty 1

## 2015-11-16 NOTE — Progress Notes (Signed)
Patient ID: [name], female   DOB: 1985-12-14, 30 y.o.   MRN: [id number] FACULTY PRACTICE ANTEPARTUM(COMPREHENSIVE) NOTE  SHANISHA LECH is a 30 y.o. G3P0201 at [redacted]w[redacted]d who is admitted for chronic pancreatitis with elevated lipase to 400's.   Fetal presentation is cephalic. Length of Stay:  4  Days  Subjective: Patient had worse pain night, requiring IV pain medicine.  Patient returned back to clear liquid diet and   Patient reports the fetal movement as active. Patient reports uterine contraction  activity as none. Patient reports  vaginal bleeding as none. Patient describes fluid per vagina as None.  Vitals:  Blood pressure (!) 117/93, pulse 75, temperature 97.9 F (36.6 C), temperature source Oral, resp. rate 18, height  (1.626 m), weight 160 lb (72.6 kg), last menstrual period 04/04/2015, SpO2 99 %, unknown if currently breastfeeding. Physical Examination:  General appearance - in mild to moderate distress, ill-appearing and chronically ill appearing Heart - normal rate and regular rhythm Abdomen - soft, gravid.  Mild tenderness to epigastric and LUQ area.  No rebound tenderness.  No guarding. Fundal Height:  size equals dates Cervical Exam: Not evaluated Extremities: extremities normal, atraumatic, no cyanosis or edema and Homans sign is negative, no sign of DVT with DTRs 2+ bilaterally Membranes:intact  Fetal Monitoring: NST x 2 reviewed: Baseline 140, mod variability, +accels, no decels Toco: no contractions  Labs:  Results for orders placed or performed during the hospital encounter of 11/12/15 (from the past 24 hour(s))  Lipase, blood   Collection Time: 11/15/15  9:54 PM  Result Value Ref Range   Lipase 120 (H) 11 - 51 U/L  CBC with Differential/Platelet   Collection Time: 11/16/15  5:55 AM  Result Value Ref Range   WBC 8.8 4.0 - 10.5 K/uL   RBC 3.36 (L) 3.87 - 5.11 MIL/uL   Hemoglobin 9.3 (L) 12.0 - 15.0 g/dL   HCT 40.9 (L) 81.1 - 91.4 %   MCV 83.9 78.0 -  100.0 fL   MCH 27.7 26.0 - 34.0 pg   MCHC 33.0 30.0 - 36.0 g/dL   RDW 78.2 95.6 - 21.3 %   Platelets 225 150 - 400 K/uL   Neutrophils Relative % 61 %   Neutro Abs 5.4 1.7 - 7.7 K/uL   Lymphocytes Relative 28 %   Lymphs Abs 2.4 0.7 - 4.0 K/uL   Monocytes Relative 8 %   Monocytes Absolute 0.7 0.1 - 1.0 K/uL   Eosinophils Relative 3 %   Eosinophils Absolute 0.3 0.0 - 0.7 K/uL   Basophils Relative 0 %   Basophils Absolute 0.0 0.0 - 0.1 K/uL  Lipase, blood   Collection Time: 11/16/15  5:55 AM  Result Value Ref Range   Lipase 95 (H) 11 - 51 U/L   Imaging Studies:     Currently EPIC will not allow sonographic studies to automatically populate into notes.  In the meantime, copy and paste results into note or free text.  Medications:  Scheduled . docusate sodium  100 mg Oral Daily  . prenatal multivitamin  1 tablet Oral QHS  . sertraline  50 mg Oral QHS   I have reviewed the patient's current medications.  ASSESSMENT: Patient Active Problem List   Diagnosis Date Noted  . Chronic recurrent pancreatitis (HCC) 11/12/2015  . Substance abuse affecting pregnancy, antepartum 11/05/2015  . Pancreatitis, acute 09/26/2015  . Hyperemesis gravidarum 06/01/2015  . ASCUS with positive high risk HPV cervical 05/24/2015  . Supervision of other high-risk  pregnancy 05/19/2015  . History of preterm delivery, currently pregnant 05/05/2015  . Preterm delivery 10/14/2014  . Depression with anxiety 10/14/2014  . History of placenta abruption 08/04/2014  . BV (bacterial vaginosis) 07/24/2014  . Chronic relapsing pancreatitis (HCC) 07/04/2014  . Prior pregnancy with fetal demise, antepartum 06/25/2014  . Pap smear abnormality of cervix with ASCUS favoring dysplasia 04/08/2014  . Rh negative state in antepartum period 02/13/2014  . Marijuana use 02/13/2014  . Hematemesis 11/04/2012  . Sleep disturbance 08/01/2012  . Generalized anxiety disorder 08/01/2012  . Opiate dependence (HCC) 02/27/2012  .  Tobacco abuse 04/13/2011  . Pancreas divisum 07/23/2010  . Anemia 07/23/2010    PLAN: 1.  Acute on chronic pancreatitis.  Patient back on clears last night - will see if tolerates. If has increased abdominal pain, may need to go back to NPO  IV fluids at 145mL/hr  Continue IV pain medicine  Discussed with patient that she needs to remain on antenatal floor  NST reactive.    Continue twice daily testing. 2.  Opioid dependence  Pt tolerant - may need more pain medicine 3.  [redacted] week Gestation  Continue current antepartum care   Nikolos Billig JEHIEL, MD 11/16/2015,7:00 AM

## 2015-11-17 ENCOUNTER — Encounter: Payer: Medicaid Other | Admitting: Obstetrics and Gynecology

## 2015-11-17 DIAGNOSIS — O09213 Supervision of pregnancy with history of pre-term labor, third trimester: Secondary | ICD-10-CM

## 2015-11-17 DIAGNOSIS — O09293 Supervision of pregnancy with other poor reproductive or obstetric history, third trimester: Secondary | ICD-10-CM

## 2015-11-17 MED ORDER — HYDROMORPHONE HCL 1 MG/ML IJ SOLN
1.0000 mg | Freq: Once | INTRAMUSCULAR | Status: AC
  Administered 2015-11-17: 1 mg via INTRAVENOUS
  Filled 2015-11-17: qty 1

## 2015-11-17 NOTE — Progress Notes (Signed)
Patient ID: [name], female   DOB: [birth year], 30 y.o.   MRN: [id number] FACULTY PRACTICE ANTEPARTUM(COMPREHENSIVE) NOTE  [name] is a 30 y.o. G3P0201 at [redacted]w[redacted]d by best clinical estimate who is admitted for acute on chronic pancreatitis.   Fetal presentation is unsure. Length of Stay:  5  Days  Subjective: Still with epigastric pain which radiates to back. Tolerated clears yesterday. Asked for more pain meds yesterday x 2 but otherwise continues on 1 mg Dilaudid q 3 hours Patient reports the fetal movement as active. Patient reports uterine contraction  activity as none Patient reports  vaginal bleeding as none. Patient describes fluid per vagina as None.  Vitals:  Blood pressure 138/86, pulse 90, temperature 98.3 F (36.8 C), temperature source Oral, resp. rate 20, height 5\' 4"  (1.626 m), weight 160 lb (72.6 kg), last menstrual period 04/04/2015, SpO2 99 %, unknown if currently breastfeeding. Physical Examination:  General appearance - alert, well appearing, and in no distress Chest - normal effort Abdomen - gravid, tender to palpation epigatrum Fundal Height:  size equals dates Extremities: Homans sign is negative, no sign of DVT  Membranes:intact  Fetal Monitoring:  Baseline: 145 bpm, Variability: Good {> 6 bpm), Accelerations: Reactive and Decelerations: Absent  Medications:  Scheduled . docusate sodium  100 mg Oral Daily  . prenatal multivitamin  1 tablet Oral QHS  . sertraline  50 mg Oral QHS   I have reviewed the patient's current medications.  ASSESSMENT: Principal Problem:   Chronic recurrent pancreatitis (HCC) Active Problems:   Opiate dependence (HCC)   Supervision of other high-risk pregnancy   Acute on chronic pancreatitis (HCC)   PLAN: Continue clears and pain management until symptomatically improved.  Reva Bores, MD 11/17/2015,7:31 AM

## 2015-11-18 NOTE — Progress Notes (Signed)
Patient ID: [name], female   DOB: 06/01/1985, 30 y.o.   MRN: [id number] FACULTY PRACTICE ANTEPARTUM(COMPREHENSIVE) NOTE  [name] is a 30 y.o. G3P0201 at [redacted]w[redacted]d by best clinical estimate who is admitted for acute on chronic pancreatitis.   Fetal presentation is unsure. Length of Stay:  6  Days  Subjective: Tolerated clears yesterday. Asked for more pain meds yesterday x 1 but otherwise continues on 1 mg Dilaudid q 3 hours Patient reports the fetal movement as active. Patient reports uterine contraction  activity as none Patient reports  vaginal bleeding as none. Patient describes fluid per vagina as None.  Vitals:  Blood pressure 130/88, pulse 70, temperature 98.1 F (36.7 C), temperature source Oral, resp. rate 16, height 5\' 4"  (1.626 m), weight 160 lb (72.6 kg), last menstrual period 04/04/2015, SpO2 99 %, unknown if currently breastfeeding. Physical Examination:  General appearance - alert, well appearing, and in no distress Chest - normal effort Abdomen - gravid, tender to palpation epigatrum Fundal Height:  size equals dates Extremities: Homans sign is negative, no sign of DVT  Membranes:intact  Fetal Monitoring:  Baseline: 145 bpm, Variability: Good {> 6 bpm), Accelerations: Reactive and Decelerations: Absent  Medications:  Scheduled . docusate sodium  100 mg Oral Daily  . prenatal multivitamin  1 tablet Oral QHS  . sertraline  50 mg Oral QHS   I have reviewed the patient's current medications.  ASSESSMENT: Principal Problem:   Chronic recurrent pancreatitis (HCC) Active Problems:   Opiate dependence (HCC)   Supervision of other high-risk pregnancy   Acute on chronic pancreatitis (HCC)   PLAN: Continue clears and advance diet as tolerated Pain management until symptomatically improved. Patient may ambulate  Medina Degraffenreid, MD 11/18/2015,6:23 AM

## 2015-11-19 DIAGNOSIS — Z029 Encounter for administrative examinations, unspecified: Secondary | ICD-10-CM

## 2015-11-19 MED ORDER — OXYCODONE HCL 5 MG PO TABS
10.0000 mg | ORAL_TABLET | Freq: Four times a day (QID) | ORAL | Status: DC
  Administered 2015-11-19 (×3): 10 mg via ORAL
  Filled 2015-11-19 (×3): qty 2

## 2015-11-19 MED ORDER — OXYCODONE HCL 10 MG PO TABS: 10.0000 mg | ORAL_TABLET | Freq: Four times a day (QID) | ORAL | 0 refills | Status: DC

## 2015-11-19 MED ORDER — ONDANSETRON 8 MG PO TBDP: 8.0000 mg | ORAL_TABLET | Freq: Three times a day (TID) | ORAL | 3 refills | Status: DC | PRN

## 2015-11-19 NOTE — Discharge Instructions (Signed)
Resume the oxycodone 10 mg q 6h. Add zofran back as needed. Procardia is available for contractions over 5/hr. Phenergan is still available

## 2015-11-19 NOTE — Progress Notes (Signed)
Discharge instructions given to patient.  Medications discussed.

## 2015-11-19 NOTE — Discharge Summary (Signed)
Antenatal Physician Discharge Summary  Patient ID: [name] MRN: [id number] DOB/AGE: 05/31/1985 30 y.o.  Admit date: 11/12/2015 Discharge date: 11/19/2015 Consults: Neonatology, Maternal Fetal Medicine  Hospital Course:  This is a 30 y.o. [id number] with IUP at [redacted]w[redacted]d admitted for pain control on Wed 7/26 with elevated lipase in 200's.. Pain control was the main issue. She had PCA Dilaudid, and made slow progress. She was initiallyn NPO, then tolerated PO adequatedly to go home on the evening of 8/2 She was admitted with  A few contractions, noted to have a cervical exam of closed.  No leaking of fluid and no bleeding.  Marland Kitchen She was seen by Neonatology during her stay.  She was observed, fetal heart rate monitoring remained reassuring, and she had no signs/symptoms of progressing preterm labor or other maternal-fetal concerns.  Her cervical exam was unchanged from admission.  She was deemed stable for discharge to home with outpatient follow up.  Discharge Exam: Temp:  [97.9 F (36.6 C)-98.5 F (36.9 C)] 98 F (36.7 C) (08/02 1553) Pulse Rate:  [63-78] 72 (08/02 1553) Resp:  [16-18] 16 (08/02 1553) BP: (121-162)/(71-124) 121/71 (08/02 1553) Weight:  [167 lb 11.2 oz (76.1 kg)] 167 lb 11.2 oz (76.1 kg) (08/02 0737) Physical Examination: CONSTITUTIONAL: Well-developed, well-nourished female in no acute distress.  HENT:  Normocephalic, atraumatic, External right and left ear normal. Oropharynx is clear and moist EYES: Conjunctivae and EOM are normal. Pupils are equal, round, and reactive to light. No scleral icterus.  NECK: Normal range of motion, supple, no masses SKIN: Skin is warm and dry. No rash noted. Not diaphoretic. No erythema. No pallor. NEUROLGIC: Alert and oriented to person, place, and time. Normal reflexes, muscle tone coordination. No cranial nerve deficit noted. PSYCHIATRIC: Normal mood and affect. Normal behavior. Normal judgment and thought content. CARDIOVASCULAR: Normal  heart rate noted, regular rhythm RESPIRATORY: Effort and breath sounds normal, no problems with respiration noted MUSCULOSKELETAL: Normal range of motion. No edema and no tenderness. 2+ distal pulses. ABDOMEN: Soft, nontender, nondistended, gravid. CERVIX:    Fetal monitoring: FHR: 145 bpm, Variability: moderate, Accelerations: Present, Decelerations: Absent  Uterine activity: no contractions per hour  Significant Diagnostic Studies:  Results for orders placed or performed during the hospital encounter of 11/12/15 (from the past 168 hour(s))  Lipase, blood   Collection Time: 11/13/15  6:01 AM  Result Value Ref Range   Lipase 414 (H) 11 - 51 U/L  Comprehensive metabolic panel   Collection Time: 11/13/15  6:01 AM  Result Value Ref Range   Sodium 134 (L) 135 - 145 mmol/L   Potassium 3.9 3.5 - 5.1 mmol/L   Chloride 104 101 - 111 mmol/L   CO2 23 22 - 32 mmol/L   Glucose, Bld 113 (H) 65 - 99 mg/dL   BUN 7 6 - 20 mg/dL   Creatinine, Ser 2.95 0.44 - 1.00 mg/dL   Calcium 8.4 (L) 8.9 - 10.3 mg/dL   Total Protein 5.6 (L) 6.5 - 8.1 g/dL   Albumin 2.5 (L) 3.5 - 5.0 g/dL   AST 13 (L) 15 - 41 U/L   ALT 10 (L) 14 - 54 U/L   Alkaline Phosphatase 115 38 - 126 U/L   Total Bilirubin 0.3 0.3 - 1.2 mg/dL   GFR calc non Af Amer >60 >60 mL/min   GFR calc Af Amer >60 >60 mL/min   Anion gap 7 5 - 15  CBC   Collection Time: 11/13/15  6:01 AM  Result Value Ref  Range   WBC 14.9 (H) 4.0 - 10.5 K/uL   RBC 3.37 (L) 3.87 - 5.11 MIL/uL   Hemoglobin 9.2 (L) 12.0 - 15.0 g/dL   HCT 91.4 (L) 78.2 - 95.6 %   MCV 85.5 78.0 - 100.0 fL   MCH 27.3 26.0 - 34.0 pg   MCHC 31.9 30.0 - 36.0 g/dL   RDW 21.3 08.6 - 57.8 %   Platelets 252 150 - 400 K/uL  Lipase, blood   Collection Time: 11/14/15  5:12 AM  Result Value Ref Range   Lipase 161 (H) 11 - 51 U/L  Comprehensive metabolic panel   Collection Time: 11/14/15  5:12 AM  Result Value Ref Range   Sodium 133 (L) 135 - 145 mmol/L   Potassium 3.8 3.5 - 5.1 mmol/L    Chloride 100 (L) 101 - 111 mmol/L   CO2 26 22 - 32 mmol/L   Glucose, Bld 79 65 - 99 mg/dL   BUN <5 (L) 6 - 20 mg/dL   Creatinine, Ser 4.69 (L) 0.44 - 1.00 mg/dL   Calcium 8.6 (L) 8.9 - 10.3 mg/dL   Total Protein 7.0 6.5 - 8.1 g/dL   Albumin 2.8 (L) 3.5 - 5.0 g/dL   AST 19 15 - 41 U/L   ALT 12 (L) 14 - 54 U/L   Alkaline Phosphatase 121 38 - 126 U/L   Total Bilirubin 0.4 0.3 - 1.2 mg/dL   GFR calc non Af Amer >60 >60 mL/min   GFR calc Af Amer >60 >60 mL/min   Anion gap 7 5 - 15  CBC with Differential/Platelet   Collection Time: 11/14/15  5:12 AM  Result Value Ref Range   WBC 11.1 (H) 4.0 - 10.5 K/uL   RBC 3.61 (L) 3.87 - 5.11 MIL/uL   Hemoglobin 10.0 (L) 12.0 - 15.0 g/dL   HCT 62.9 (L) 52.8 - 41.3 %   MCV 85.9 78.0 - 100.0 fL   MCH 27.7 26.0 - 34.0 pg   MCHC 32.3 30.0 - 36.0 g/dL   RDW 24.4 01.0 - 27.2 %   Platelets 250 150 - 400 K/uL   Neutrophils Relative % 74 %   Neutro Abs 8.1 (H) 1.7 - 7.7 K/uL   Lymphocytes Relative 16 %   Lymphs Abs 1.8 0.7 - 4.0 K/uL   Monocytes Relative 8 %   Monocytes Absolute 0.9 0.1 - 1.0 K/uL   Eosinophils Relative 2 %   Eosinophils Absolute 0.3 0.0 - 0.7 K/uL   Basophils Relative 0 %   Basophils Absolute 0.0 0.0 - 0.1 K/uL  CBC with Differential/Platelet   Collection Time: 11/15/15  5:46 AM  Result Value Ref Range   WBC 9.9 4.0 - 10.5 K/uL   RBC 3.24 (L) 3.87 - 5.11 MIL/uL   Hemoglobin 9.0 (L) 12.0 - 15.0 g/dL   HCT 53.6 (L) 64.4 - 03.4 %   MCV 84.6 78.0 - 100.0 fL   MCH 27.8 26.0 - 34.0 pg   MCHC 32.8 30.0 - 36.0 g/dL   RDW 74.2 59.5 - 63.8 %   Platelets 229 150 - 400 K/uL   Neutrophils Relative % 69 %   Neutro Abs 6.8 1.7 - 7.7 K/uL   Lymphocytes Relative 22 %   Lymphs Abs 2.1 0.7 - 4.0 K/uL   Monocytes Relative 6 %   Monocytes Absolute 0.6 0.1 - 1.0 K/uL   Eosinophils Relative 3 %   Eosinophils Absolute 0.3 0.0 - 0.7 K/uL   Basophils Relative 0 %  Basophils Absolute 0.0 0.0 - 0.1 K/uL  Lipase, blood   Collection Time:  11/15/15  5:46 AM  Result Value Ref Range   Lipase 75 (H) 11 - 51 U/L  Lipase, blood   Collection Time: 11/15/15  9:54 PM  Result Value Ref Range   Lipase 120 (H) 11 - 51 U/L  CBC with Differential/Platelet   Collection Time: 11/16/15  5:55 AM  Result Value Ref Range   WBC 8.8 4.0 - 10.5 K/uL   RBC 3.36 (L) 3.87 - 5.11 MIL/uL   Hemoglobin 9.3 (L) 12.0 - 15.0 g/dL   HCT 83.0 (L) 94.0 - 76.8 %   MCV 83.9 78.0 - 100.0 fL   MCH 27.7 26.0 - 34.0 pg   MCHC 33.0 30.0 - 36.0 g/dL   RDW 08.8 11.0 - 31.5 %   Platelets 225 150 - 400 K/uL   Neutrophils Relative % 61 %   Neutro Abs 5.4 1.7 - 7.7 K/uL   Lymphocytes Relative 28 %   Lymphs Abs 2.4 0.7 - 4.0 K/uL   Monocytes Relative 8 %   Monocytes Absolute 0.7 0.1 - 1.0 K/uL   Eosinophils Relative 3 %   Eosinophils Absolute 0.3 0.0 - 0.7 K/uL   Basophils Relative 0 %   Basophils Absolute 0.0 0.0 - 0.1 K/uL  Lipase, blood   Collection Time: 11/16/15  5:55 AM  Result Value Ref Range   Lipase 95 (H) 11 - 51 U/L   Lipase     Component Value Date/Time   LIPASE 95 (H) 11/16/2015 0555   Discharge Condition: Stable  Disposition: 01-Home or Self Care   Discharge Instructions    Call MD for:  persistant nausea and vomiting    Complete by:  As directed   Call MD for:  severe uncontrolled pain    Complete by:  As directed   Call MD for:  temperature >100.4    Complete by:  As directed   Diet - low sodium heart healthy    Complete by:  As directed   Increase activity slowly    Complete by:  As directed       Medication List    TAKE these medications   acetaminophen 500 MG tablet Commonly known as:  TYLENOL Take 1,000 mg by mouth every 6 (six) hours as needed for mild pain or headache.   diphenhydrAMINE 25 MG tablet Commonly known as:  BENADRYL Take 25 mg by mouth every 6 (six) hours as needed for itching or allergies.   NIFEdipine 10 MG capsule Commonly known as:  PROCARDIA Take 1 capsule (10 mg total) by mouth every 6 (six)  hours as needed (for greater than 5 contractions per hour).   ondansetron 8 MG disintegrating tablet Commonly known as:  ZOFRAN ODT Take 1 tablet (8 mg total) by mouth every 8 (eight) hours as needed for nausea or vomiting.   Oxycodone HCl 10 MG Tabs Take 1 tablet (10 mg total) by mouth every 6 (six) hours.   PRENATAL COMPLETE 14-0.4 MG Tabs Take 1 tablet by mouth at bedtime.   promethazine 25 MG tablet Commonly known as:  PHENERGAN TAKE 1 TABLET BY MOUTH EVERY 6 HOURS AS NEEDED FOR NAUSEA AND VOMITING   sertraline 50 MG tablet Commonly known as:  ZOLOFT Take 50 mg by mouth at bedtime.      Follow-up Information    [facility] Follow up in 1 week(s).   Specialty:  Obstetrics and Gynecology Contact information: [address]  [address] [place] [address] [phone]          Signed: Tilda Burrow M.D. 11/19/2015, 6:31 PM

## 2015-11-19 NOTE — Progress Notes (Signed)
FACULTY PRACTICE ANTEPARTUM(COMPREHENSIVE) NOTE  [name] is a 30 y.o. G3P0201 at [redacted]w[redacted]d by LMP who is admitted for exacerbation of pancreatitis.   Fetal presentation is unsure. Length of Stay:  7  Days  Subjective: Tolerating solid food Patient reports the fetal movement as active. Patient reports uterine contraction  activity as none. Patient reports  vaginal bleeding as none. Patient describes fluid per vagina as None.  Vitals:  Blood pressure (!) 154/97, pulse 63, temperature 98 F (36.7 C), temperature source Oral, resp. rate 18, height 5\' 4"  (1.626 m), weight 72.6 kg (160 lb), last menstrual period 04/04/2015, SpO2 99 %, unknown if currently breastfeeding. Physical Examination:  General appearance - alert, well appearing, and in no distress Heart - normal rate and regular rhythm Abdomen - soft, nontender, nondistended Fundal Height:  size equals dates Cervical Exam: Not evaluated. Extremities: extremities normal, atraumatic, no cyanosis or edema and Homans sign is negative,  Membranes:intact  Labs:  No results found for this or any previous visit (from the past 24 hour(s)).  Imaging Studies:     Currently EPIC will not allow sonographic studies to automatically populate into notes.  In the meantime, copy and paste results into note or free text.  Medications:  Scheduled . docusate sodium  100 mg Oral Daily  . Oxycodone HCl  10 mg Oral Q6H  . prenatal multivitamin  1 tablet Oral QHS  . sertraline  50 mg Oral QHS   I have reviewed the patient's current medications.  ASSESSMENT: Patient Active Problem List   Diagnosis Date Noted  . Chronic recurrent pancreatitis (HCC) 11/12/2015  . Substance abuse affecting pregnancy, antepartum 11/05/2015  . Acute on chronic pancreatitis (HCC) 09/26/2015  . Hyperemesis gravidarum 06/01/2015  . ASCUS with positive high risk HPV cervical 05/24/2015  . Supervision of other high-risk pregnancy 05/19/2015  . History of preterm  delivery, currently pregnant 05/05/2015  . Preterm delivery 10/14/2014  . Depression with anxiety 10/14/2014  . History of placenta abruption 08/04/2014  . BV (bacterial vaginosis) 07/24/2014  . Chronic relapsing pancreatitis (HCC) 07/04/2014  . Prior pregnancy with fetal demise, antepartum 06/25/2014  . Pap smear abnormality of cervix with ASCUS favoring dysplasia 04/08/2014  . Rh negative state in antepartum period 02/13/2014  . Marijuana use 02/13/2014  . Hematemesis 11/04/2012  . Sleep disturbance 08/01/2012  . Generalized anxiety disorder 08/01/2012  . Opiate dependence (HCC) 02/27/2012  . Tobacco abuse 04/13/2011  . Pancreas divisum 07/23/2010  . Anemia 07/23/2010    PLAN: D/C IV dilaudid and change to oxycodone 10 mg PO  [name] 11/19/2015,7:05 AM

## 2015-11-24 ENCOUNTER — Ambulatory Visit (INDEPENDENT_AMBULATORY_CARE_PROVIDER_SITE_OTHER): Payer: Medicaid Other | Admitting: Obstetrics and Gynecology

## 2015-11-24 ENCOUNTER — Encounter: Payer: Self-pay | Admitting: Obstetrics and Gynecology

## 2015-11-24 VITALS — BP 128/90 | HR 94 | Wt 164.0 lb

## 2015-11-24 DIAGNOSIS — O360130 Maternal care for anti-D [Rh] antibodies, third trimester, not applicable or unspecified: Secondary | ICD-10-CM | POA: Diagnosis not present

## 2015-11-24 DIAGNOSIS — O99323 Drug use complicating pregnancy, third trimester: Secondary | ICD-10-CM | POA: Diagnosis not present

## 2015-11-24 DIAGNOSIS — Z3A34 34 weeks gestation of pregnancy: Secondary | ICD-10-CM | POA: Diagnosis not present

## 2015-11-24 DIAGNOSIS — K861 Other chronic pancreatitis: Secondary | ICD-10-CM | POA: Diagnosis not present

## 2015-11-24 DIAGNOSIS — Z1389 Encounter for screening for other disorder: Secondary | ICD-10-CM | POA: Diagnosis not present

## 2015-11-24 DIAGNOSIS — Z331 Pregnant state, incidental: Secondary | ICD-10-CM

## 2015-11-24 DIAGNOSIS — K859 Acute pancreatitis without necrosis or infection, unspecified: Secondary | ICD-10-CM

## 2015-11-24 DIAGNOSIS — O99613 Diseases of the digestive system complicating pregnancy, third trimester: Secondary | ICD-10-CM

## 2015-11-24 DIAGNOSIS — O09893 Supervision of other high risk pregnancies, third trimester: Secondary | ICD-10-CM

## 2015-11-24 LAB — POCT URINALYSIS DIPSTICK
Glucose, UA: NEGATIVE
Ketones, UA: NEGATIVE
LEUKOCYTES UA: NEGATIVE
NITRITE UA: NEGATIVE
PROTEIN UA: NEGATIVE
RBC UA: NEGATIVE

## 2015-11-24 MED ORDER — OXYCODONE HCL 10 MG PO TABS: 10.0000 mg | ORAL_TABLET | ORAL | 0 refills | Status: DC

## 2015-11-24 NOTE — Progress Notes (Signed)
Patient ID: Amber BirkKatie C Dunn, female   DOB: 1985/07/09, 30 y.o.   MRN: [id number]    High Risk Pregnancy HROB Diagnosis(es):   opiate dependence d/t chronic pancreatitis, h/o IUFD w/ cardiac anomalies, h/o PTB  G3P0201 8824w3d Estimated Date of Delivery: 01/09/16    HPI: The patient is being seen today for ongoing management of the above. 1. Opiate dependence / Chronic pancreatitis:     Pt hospitALIZED last week, received Rx for oxycodone 10q6 at 8 pm last Wednesday, and began to take it q 4h. She now has only 2 more pills , will require adjustment of dosing to q 4h. Will recheck Amylase /Lipase to check status of pancreas. 2. Hx cardiac anomalies:: has app[facility] at 1 pm today 3. PTB : has Rx for Procardia 10 Q6h when having over 5 contractions per hour.  [facility] for chronic pancreatitis with elevated lipase to 200's. Today she reports she has increased her Oxycodone to q4-5h since discharge from the hospital and as a result, has run out of the medication.   Per FOB, HE is not currently taking any street drugs and is not in any program to assist him through withdrawals.    Patient reports good fetal movement, denies any bleeding and no rupture of membranes symptoms or regular contractions.   BP weight and urine results reviewed and noted. Blood pressure 128/90, pulse 94, weight 164 lb (74.4 kg), last menstrual period 04/04/2015, unknown if currently breastfeeding.  Fundal Height:  31 cm  Fetal Heart rate:  154  bpm  Physical Examination: Abdomen - soft, nontender, nondistended, no masses or organomegaly                                     Edema:  none  Urinalysis:NEGATIVE for all                 POSITIVE for none  Fetal Surveillance Testing today:  [facility]   Lab and sonogram results have been reviewed. Comments: normal   Assessment:  1.  Pregnancy at 7524w3d,  [id number]   :  Estimated Date of Delivery: 01/09/16                     2.  opiate dependence d/t chronic pancreatitis, h/o IUFD w/ cardiac anomalies, h/o PTB                        3. UDS today, [facility]  Medication(s) Plans:  Refill 10 mg Oxycodone at q4h                                      Check lipase amylase.                                    Check uds                                    Pharmacy notified of dosing change. Treatment Plan:  Increase Oxycodone from 10mg /6hr to 10mg /4hr with UDS weekly  Follow up in  1 week for appointment for high risk OB care, UDS q visit.   By signing my name below, I, Doreatha Martin, attest that this documentation has been prepared under the direction and in the presence of Tilda Burrow, MD. Electronically Signed: Doreatha Martin, ED Scribe. 11/24/15. 8:56 AM.  I personally performed the services described in this documentation, which was SCRIBED in my presence. The recorded information has been reviewed and considered accurate. It has been edited as necessary during review. Tilda Burrow, MD

## 2015-11-25 ENCOUNTER — Other Ambulatory Visit: Payer: Self-pay | Admitting: Obstetrics and Gynecology

## 2015-11-25 ENCOUNTER — Telehealth: Payer: Self-pay | Admitting: Obstetrics and Gynecology

## 2015-11-25 LAB — PMP SCREEN PROFILE (10S), URINE
Amphetamine Screen, Ur: NEGATIVE ng/mL
BENZODIAZEPINE SCREEN, URINE: NEGATIVE ng/mL
Barbiturate Screen, Ur: NEGATIVE ng/mL
CANNABINOIDS UR QL SCN: NEGATIVE ng/mL
Cocaine(Metab.)Screen, Urine: NEGATIVE ng/mL
Creatinine(Crt), U: 96.7 mg/dL (ref 20.0–300.0)
Methadone Scn, Ur: NEGATIVE ng/mL
OPIATE SCRN UR: POSITIVE ng/mL
OXYCODONE+OXYMORPHONE UR QL SCN: POSITIVE ng/mL
PCP Scrn, Ur: NEGATIVE ng/mL
Ph of Urine: 6.5 (ref 4.5–8.9)
Propoxyphene, Screen: NEGATIVE ng/mL

## 2015-11-25 LAB — COMPREHENSIVE METABOLIC PANEL
A/G RATIO: 1.2 (ref 1.2–2.2)
ALK PHOS: 144 IU/L — AB (ref 39–117)
ALT: 7 IU/L (ref 0–32)
AST: 8 IU/L (ref 0–40)
Albumin: 3.3 g/dL — ABNORMAL LOW (ref 3.5–5.5)
BUN/Creatinine Ratio: 11 (ref 9–23)
BUN: 7 mg/dL (ref 6–20)
Bilirubin Total: <0.2 mg/dL (ref 0.0–1.2)
CO2: 22 mmol/L (ref 18–29)
Calcium: 8.8 mg/dL (ref 8.7–10.2)
Chloride: 98 mmol/L (ref 96–106)
Creatinine, Ser: 0.64 mg/dL (ref 0.57–1.00)
GFR calc Af Amer: 140 mL/min/{1.73_m2} (ref >59)
GFR calc non Af Amer: 122 mL/min/{1.73_m2} (ref >59)
GLOBULIN, TOTAL: 2.7 g/dL (ref 1.5–4.5)
Glucose: 83 mg/dL (ref 65–99)
POTASSIUM: 4.5 mmol/L (ref 3.5–5.2)
SODIUM: 133 mmol/L — AB (ref 134–144)
Total Protein: 6 g/dL (ref 6.0–8.5)

## 2015-11-25 LAB — AMYLASE: Amylase: 169 U/L — ABNORMAL HIGH (ref 31–124)

## 2015-11-25 NOTE — Telephone Encounter (Signed)
I spoke with the patient , informing her of the persistent elevation of Amylase in to 160's. Pt knows to follow bland diet. And knows that inpt care may be needed if pain worsens. Follow weekly

## 2015-11-26 ENCOUNTER — Encounter (HOSPITAL_COMMUNITY)
Admission: RE | Admit: 2015-11-26 | Discharge: 2015-11-26 | Disposition: A | Discharge status: Home / Self Care | Payer: Medicaid Other | Source: Ambulatory Visit | Attending: Obstetrics and Gynecology | Admitting: Obstetrics and Gynecology

## 2015-12-01 ENCOUNTER — Encounter: Payer: Self-pay | Admitting: Obstetrics and Gynecology

## 2015-12-01 ENCOUNTER — Ambulatory Visit (INDEPENDENT_AMBULATORY_CARE_PROVIDER_SITE_OTHER): Payer: Medicaid Other | Admitting: Obstetrics and Gynecology

## 2015-12-01 VITALS — BP 122/80 | HR 88 | Wt 166.0 lb

## 2015-12-01 DIAGNOSIS — O09212 Supervision of pregnancy with history of pre-term labor, second trimester: Secondary | ICD-10-CM

## 2015-12-01 DIAGNOSIS — B9689 Other specified bacterial agents as the cause of diseases classified elsewhere: Secondary | ICD-10-CM

## 2015-12-01 DIAGNOSIS — Z3A35 35 weeks gestation of pregnancy: Secondary | ICD-10-CM | POA: Diagnosis not present

## 2015-12-01 DIAGNOSIS — O09893 Supervision of other high risk pregnancies, third trimester: Secondary | ICD-10-CM

## 2015-12-01 DIAGNOSIS — Z1389 Encounter for screening for other disorder: Secondary | ICD-10-CM

## 2015-12-01 DIAGNOSIS — Z331 Pregnant state, incidental: Secondary | ICD-10-CM

## 2015-12-01 DIAGNOSIS — O10913 Unspecified pre-existing hypertension complicating pregnancy, third trimester: Secondary | ICD-10-CM | POA: Diagnosis not present

## 2015-12-01 DIAGNOSIS — O09892 Supervision of other high risk pregnancies, second trimester: Secondary | ICD-10-CM

## 2015-12-01 DIAGNOSIS — K861 Other chronic pancreatitis: Secondary | ICD-10-CM

## 2015-12-01 DIAGNOSIS — O219 Vomiting of pregnancy, unspecified: Secondary | ICD-10-CM | POA: Diagnosis not present

## 2015-12-01 DIAGNOSIS — O1203 Gestational edema, third trimester: Secondary | ICD-10-CM

## 2015-12-01 DIAGNOSIS — N760 Acute vaginitis: Secondary | ICD-10-CM

## 2015-12-01 DIAGNOSIS — O4703 False labor before 37 completed weeks of gestation, third trimester: Secondary | ICD-10-CM

## 2015-12-01 DIAGNOSIS — O99323 Drug use complicating pregnancy, third trimester: Secondary | ICD-10-CM | POA: Diagnosis not present

## 2015-12-01 LAB — POCT URINALYSIS DIPSTICK
Blood, UA: NEGATIVE
Glucose, UA: NEGATIVE
Ketones, UA: NEGATIVE
Leukocytes, UA: NEGATIVE
NITRITE UA: NEGATIVE

## 2015-12-01 MED ORDER — OXYCODONE HCL 10 MG PO TABS: 10.0000 mg | ORAL_TABLET | ORAL | 0 refills | Status: DC

## 2015-12-01 MED ORDER — PROMETHAZINE HCL 25 MG PO TABS: ORAL_TABLET | ORAL | 1 refills | Status: DC

## 2015-12-01 NOTE — Progress Notes (Signed)
Pt states that she is having a lot of pain in her lower back and a lot more pressure, cramps, and swelling.

## 2015-12-01 NOTE — Progress Notes (Signed)
High Risk Pregnancy HROB Diagnosis(es):   opiate dependence d/t chronic pancreatitis, h/o IUFD w/ cardiac anomalies, h/o PTB  [id number] 165w3d Estimated Date of Delivery: 01/09/16    HPI: The patient is being seen today for ongoing management of the above. Today she reports continued pain for which she has been taking oxycodone q4 hours. Patient reports good fetal movement, denies any bleeding and no rupture of membranes symptoms or regular contractions.   BP weight and urine results reviewed and noted. Blood pressure 122/80, pulse 88, weight 166 lb (75.3 kg), last menstrual period 04/04/2015, unknown if currently breastfeeding.  Fundal Height:  33 cm Fetal Heart rate:  156 bpm Physical Examination: Abdomen - normal                                     Pelvic -  1 cm/20/-3                                     Edema:  Ankle 1+   Urinalysis:NEGATIVE for glucose                 POSITIVE for trace protein   Fetal Surveillance Testing today:  None today; will have in 1 week  Lab and sonogram results have been reviewed. Comments:  Importance of increasing the honesty of interactions with caregivers emphasized; The importance of letting us know when appts will NOT be kept so that Dr cotton's appts could be used by others discussed.  Assessment:  1.  Pregnancy at 5965w3d,  [id number]                              :  Estimated Date of Delivery: 01/09/16                         2.  opiate dependence d/t chronic pancreatitis, h/o IUFD w/ cardiac anomalies                        3. Noncompliance with appt for fetal echo.   Medication(s) Plans:  Refill 10 mg Oxycodone at q4h                                      Pt to bring oxy bottle to each appt for pill count  Treatment Plan:  Continue with current treatments   Follow up in 1 week for appointment for high risk OB care, UDS q visit

## 2015-12-02 ENCOUNTER — Encounter (HOSPITAL_COMMUNITY): Payer: Self-pay | Admitting: General Practice

## 2015-12-02 ENCOUNTER — Inpatient Hospital Stay (HOSPITAL_COMMUNITY): Payer: Medicaid Other

## 2015-12-02 ENCOUNTER — Inpatient Hospital Stay (HOSPITAL_COMMUNITY): Payer: Medicaid Other | Admitting: Anesthesiology

## 2015-12-02 ENCOUNTER — Inpatient Hospital Stay (HOSPITAL_COMMUNITY)
Admission: AD | Admit: 2015-12-02 | Discharge: 2015-12-05 | DRG: 774 | Disposition: A | Discharge status: Home / Self Care | Payer: Medicaid Other | Source: Ambulatory Visit | Attending: Family Medicine | Admitting: Family Medicine

## 2015-12-02 DIAGNOSIS — Z833 Family history of diabetes mellitus: Secondary | ICD-10-CM

## 2015-12-02 DIAGNOSIS — Z8249 Family history of ischemic heart disease and other diseases of the circulatory system: Secondary | ICD-10-CM

## 2015-12-02 DIAGNOSIS — Z825 Family history of asthma and other chronic lower respiratory diseases: Secondary | ICD-10-CM

## 2015-12-02 DIAGNOSIS — Z3A34 34 weeks gestation of pregnancy: Secondary | ICD-10-CM | POA: Diagnosis not present

## 2015-12-02 DIAGNOSIS — O09212 Supervision of pregnancy with history of pre-term labor, second trimester: Secondary | ICD-10-CM

## 2015-12-02 DIAGNOSIS — K861 Other chronic pancreatitis: Secondary | ICD-10-CM | POA: Diagnosis present

## 2015-12-02 DIAGNOSIS — O4690 Antepartum hemorrhage, unspecified, unspecified trimester: Secondary | ICD-10-CM

## 2015-12-02 DIAGNOSIS — O364XX0 Maternal care for intrauterine death, not applicable or unspecified: Principal | ICD-10-CM | POA: Diagnosis present

## 2015-12-02 DIAGNOSIS — F17210 Nicotine dependence, cigarettes, uncomplicated: Secondary | ICD-10-CM | POA: Diagnosis present

## 2015-12-02 DIAGNOSIS — O9962 Diseases of the digestive system complicating childbirth: Secondary | ICD-10-CM | POA: Diagnosis present

## 2015-12-02 DIAGNOSIS — O99334 Smoking (tobacco) complicating childbirth: Secondary | ICD-10-CM | POA: Diagnosis present

## 2015-12-02 DIAGNOSIS — K219 Gastro-esophageal reflux disease without esophagitis: Secondary | ICD-10-CM | POA: Diagnosis present

## 2015-12-02 DIAGNOSIS — O4593 Premature separation of placenta, unspecified, third trimester: Secondary | ICD-10-CM | POA: Diagnosis present

## 2015-12-02 DIAGNOSIS — O09892 Supervision of other high risk pregnancies, second trimester: Secondary | ICD-10-CM

## 2015-12-02 DIAGNOSIS — O4693 Antepartum hemorrhage, unspecified, third trimester: Secondary | ICD-10-CM

## 2015-12-02 DIAGNOSIS — IMO0002 Reserved for concepts with insufficient information to code with codable children: Secondary | ICD-10-CM | POA: Diagnosis present

## 2015-12-02 DIAGNOSIS — O09893 Supervision of other high risk pregnancies, third trimester: Secondary | ICD-10-CM

## 2015-12-02 LAB — CBC
HCT: 26.1 % — ABNORMAL LOW (ref 36.0–46.0)
HCT: 22.5 % — ABNORMAL LOW (ref 36.0–46.0)
HEMOGLOBIN: 8.7 g/dL — AB (ref 12.0–15.0)
Hemoglobin: 7.5 g/dL — ABNORMAL LOW (ref 12.0–15.0)
MCH: 26.8 pg (ref 26.0–34.0)
MCH: 26.9 pg (ref 26.0–34.0)
MCHC: 33.3 g/dL (ref 30.0–36.0)
MCHC: 33.3 g/dL (ref 30.0–36.0)
MCV: 80.3 fL (ref 78.0–100.0)
MCV: 80.6 fL (ref 78.0–100.0)
PLATELETS: 155 10*3/uL (ref 150–400)
Platelets: 242 10*3/uL (ref 150–400)
RBC: 3.25 MIL/uL — ABNORMAL LOW (ref 3.87–5.11)
RBC: 2.79 MIL/uL — AB (ref 3.87–5.11)
RDW: 15.8 % — AB (ref 11.5–15.5)
RDW: 15.9 % — ABNORMAL HIGH (ref 11.5–15.5)
WBC: 19.5 10*3/uL — ABNORMAL HIGH (ref 4.0–10.5)
WBC: 24.5 10*3/uL — AB (ref 4.0–10.5)

## 2015-12-02 LAB — RAPID URINE DRUG SCREEN, HOSP PERFORMED
Amphetamines: NOT DETECTED
BARBITURATES: NOT DETECTED
Benzodiazepines: NOT DETECTED
COCAINE: NOT DETECTED
Opiates: POSITIVE — AB
TETRAHYDROCANNABINOL: NOT DETECTED

## 2015-12-02 LAB — DIC (DISSEMINATED INTRAVASCULAR COAGULATION)PANEL
Prothrombin Time: 14.2 seconds (ref 11.4–15.2)
Smear Review: NONE SEEN

## 2015-12-02 LAB — DIC (DISSEMINATED INTRAVASCULAR COAGULATION) PANEL
APTT: 32 s (ref 24–36)
FIBRINOGEN: 272 mg/dL (ref 210–475)
INR: 1.1
PLATELETS: 243 10*3/uL (ref 150–400)

## 2015-12-02 LAB — PREPARE RBC (CROSSMATCH)

## 2015-12-02 MED ORDER — ONDANSETRON HCL 4 MG/2ML IJ SOLN
4.0000 mg | Freq: Four times a day (QID) | INTRAMUSCULAR | Status: DC | PRN
  Administered 2015-12-02 – 2015-12-03 (×2): 4 mg via INTRAVENOUS
  Filled 2015-12-02 (×2): qty 2

## 2015-12-02 MED ORDER — LACTATED RINGERS IV SOLN
500.0000 mL | INTRAVENOUS | Status: DC | PRN
  Administered 2015-12-02: 1000 mL via INTRAVENOUS

## 2015-12-02 MED ORDER — PHENYLEPHRINE 40 MCG/ML (10ML) SYRINGE FOR IV PUSH (FOR BLOOD PRESSURE SUPPORT)
80.0000 ug | PREFILLED_SYRINGE | INTRAVENOUS | Status: DC | PRN
  Administered 2015-12-02: 40 ug via INTRAVENOUS
  Administered 2015-12-02: 20:00:00 via INTRAVENOUS
  Filled 2015-12-02: qty 5

## 2015-12-02 MED ORDER — PHENYLEPHRINE 40 MCG/ML (10ML) SYRINGE FOR IV PUSH (FOR BLOOD PRESSURE SUPPORT)
80.0000 ug | PREFILLED_SYRINGE | INTRAVENOUS | Status: DC | PRN
  Filled 2015-12-02: qty 5

## 2015-12-02 MED ORDER — DIPHENHYDRAMINE HCL 50 MG/ML IJ SOLN: 12.5000 mg | INTRAMUSCULAR | Status: DC | PRN

## 2015-12-02 MED ORDER — EPHEDRINE 5 MG/ML INJ
10.0000 mg | INTRAVENOUS | Status: DC | PRN
  Filled 2015-12-02: qty 4

## 2015-12-02 MED ORDER — LACTATED RINGERS IV SOLN
500.0000 mL | Freq: Once | INTRAVENOUS | Status: AC
  Administered 2015-12-02: 500 mL via INTRAVENOUS

## 2015-12-02 MED ORDER — OXYTOCIN BOLUS FROM INFUSION
500.0000 mL | Freq: Once | INTRAVENOUS | Status: AC
  Administered 2015-12-03: 500 mL via INTRAVENOUS

## 2015-12-02 MED ORDER — FENTANYL CITRATE (PF) 100 MCG/2ML IJ SOLN
INTRAMUSCULAR | Status: AC
  Filled 2015-12-02: qty 2

## 2015-12-02 MED ORDER — OXYTOCIN 40 UNITS IN LACTATED RINGERS INFUSION - SIMPLE MED
2.5000 [IU]/h | INTRAVENOUS | Status: DC
  Filled 2015-12-02: qty 1000

## 2015-12-02 MED ORDER — EPHEDRINE 5 MG/ML INJ
INTRAVENOUS | Status: AC
  Filled 2015-12-02: qty 4

## 2015-12-02 MED ORDER — FENTANYL 2.5 MCG/ML BUPIVACAINE 1/10 % EPIDURAL INFUSION (WH - ANES)
INTRAMUSCULAR | Status: AC
  Filled 2015-12-02: qty 125

## 2015-12-02 MED ORDER — LACTATED RINGERS IV SOLN
INTRAVENOUS | Status: DC
  Administered 2015-12-02 (×2): via INTRAVENOUS

## 2015-12-02 MED ORDER — ACETAMINOPHEN 325 MG PO TABS: 650.0000 mg | ORAL_TABLET | ORAL | Status: DC | PRN

## 2015-12-02 MED ORDER — FENTANYL CITRATE (PF) 100 MCG/2ML IJ SOLN
100.0000 ug | INTRAMUSCULAR | Status: DC | PRN
  Administered 2015-12-02: 100 ug via INTRAVENOUS

## 2015-12-02 MED ORDER — LIDOCAINE HCL (PF) 1 % IJ SOLN
INTRAMUSCULAR | Status: DC | PRN
  Administered 2015-12-02 (×2): 4 mL

## 2015-12-02 MED ORDER — PHENYLEPHRINE 40 MCG/ML (10ML) SYRINGE FOR IV PUSH (FOR BLOOD PRESSURE SUPPORT)
PREFILLED_SYRINGE | INTRAVENOUS | Status: AC
  Filled 2015-12-02: qty 20

## 2015-12-02 MED ORDER — LIDOCAINE HCL (PF) 1 % IJ SOLN
30.0000 mL | INTRAMUSCULAR | Status: DC | PRN
  Filled 2015-12-02: qty 30

## 2015-12-02 MED ORDER — FENTANYL 2.5 MCG/ML BUPIVACAINE 1/10 % EPIDURAL INFUSION (WH - ANES)
14.0000 mL/h | INTRAMUSCULAR | Status: DC | PRN
  Administered 2015-12-02: 14 mL/h via EPIDURAL

## 2015-12-02 MED ORDER — SOD CITRATE-CITRIC ACID 500-334 MG/5ML PO SOLN: 30.0000 mL | ORAL | Status: DC | PRN
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
## 2015-12-02 NOTE — MAU Note (Signed)
Arrival by EMS, vaginal bleeding, abd pain, and water broke at 6:30 pm.  Reports felt baby move earlier today, decreased fetal movement today.

## 2015-12-02 NOTE — Anesthesia Preprocedure Evaluation (Signed)
Anesthesia Evaluation  Patient identified by MRN, date of birth, ID band Patient awake    Reviewed: Allergy & Precautions, NPO status , Patient's Chart, lab work & pertinent test results  History of Anesthesia Complications Negative for: history of anesthetic complications  Airway Mallampati: II  TM Distance: >3 FB Neck ROM: Full    Dental no notable dental hx. (+) Dental Advisory Given   Pulmonary neg pulmonary ROS, Current Smoker,    Pulmonary exam normal breath sounds clear to auscultation       Cardiovascular hypertension, Normal cardiovascular exam Rhythm:Regular Rate:Normal     Neuro/Psych  Headaches, PSYCHIATRIC DISORDERS Anxiety Depression    GI/Hepatic Neg liver ROS, GERD  ,  Endo/Other  negative endocrine ROS  Renal/GU negative Renal ROS  negative genitourinary   Musculoskeletal negative musculoskeletal ROS (+)   Abdominal   Peds negative pediatric ROS (+)  Hematology negative hematology ROS (+)   Anesthesia Other Findings   Reproductive/Obstetrics (+) Pregnancy                             Anesthesia Physical Anesthesia Plan  ASA: II  Anesthesia Plan: Epidural   Post-op Pain Management:    Induction:   Airway Management Planned:   Additional Equipment:   Intra-op Plan:   Post-operative Plan:   Informed Consent: I have reviewed the patients History and Physical, chart, labs and discussed the procedure including the risks, benefits and alternatives for the proposed anesthesia with the patient or authorized representative who has indicated his/her understanding and acceptance.   Dental advisory given  Plan Discussed with: CRNA  Anesthesia Plan Comments:         Anesthesia Quick Evaluation

## 2015-12-02 NOTE — Anesthesia Procedure Notes (Signed)
Epidural Patient location during procedure: OB  Staffing Anesthesiologist: Karon Cotterill Performed: anesthesiologist   Preanesthetic Checklist Completed: patient identified, site marked, surgical consent, pre-op evaluation, timeout performed, IV checked, risks and benefits discussed and monitors and equipment checked  Epidural Patient position: sitting Prep: site prepped and draped and DuraPrep Patient monitoring: continuous pulse ox and blood pressure Approach: midline Location: L3-L4 Injection technique: LOR saline  Needle:  Needle type: Tuohy  Needle gauge: 17 G Needle length: 9 cm and 9 Needle insertion depth: 6 cm Catheter type: closed end flexible Catheter size: 19 Gauge Catheter at skin depth: 10 cm Test dose: negative  Assessment Events: blood not aspirated, injection not painful, no injection resistance, negative IV test and no paresthesia  Additional Notes Patient identified. Risks/Benefits/Options discussed with patient including but not limited to bleeding, infection, nerve damage, paralysis, failed block, incomplete pain control, headache, blood pressure changes, nausea, vomiting, reactions to medication both or allergic, itching and postpartum back pain. Confirmed with bedside nurse the patient's most recent platelet count. Confirmed with patient that they are not currently taking any anticoagulation, have any bleeding history or any family history of bleeding disorders. Patient expressed understanding and wished to proceed. All questions were answered. Sterile technique was used throughout the entire procedure. Please see nursing notes for vital signs. Test dose was given through epidural catheter and negative prior to continuing to dose epidural or start infusion. Warning signs of high block given to the patient including shortness of breath, tingling/numbness in hands, complete motor block, or any concerning symptoms with instructions to call for help. Patient was given  instructions on fall risk and not to get out of bed. All questions and concerns addressed with instructions to call with any issues or inadequate analgesia.        

## 2015-12-03 ENCOUNTER — Encounter (HOSPITAL_COMMUNITY): Payer: Self-pay | Admitting: *Deleted

## 2015-12-03 ENCOUNTER — Encounter (HOSPITAL_COMMUNITY): Payer: Self-pay | Admitting: Anesthesiology

## 2015-12-03 DIAGNOSIS — O364XX0 Maternal care for intrauterine death, not applicable or unspecified: Secondary | ICD-10-CM

## 2015-12-03 DIAGNOSIS — O99334 Smoking (tobacco) complicating childbirth: Secondary | ICD-10-CM

## 2015-12-03 DIAGNOSIS — O4593 Premature separation of placenta, unspecified, third trimester: Secondary | ICD-10-CM

## 2015-12-03 DIAGNOSIS — O9962 Diseases of the digestive system complicating childbirth: Secondary | ICD-10-CM

## 2015-12-03 DIAGNOSIS — K861 Other chronic pancreatitis: Secondary | ICD-10-CM

## 2015-12-03 DIAGNOSIS — Z3A34 34 weeks gestation of pregnancy: Secondary | ICD-10-CM

## 2015-12-03 LAB — DIC (DISSEMINATED INTRAVASCULAR COAGULATION) PANEL
APTT: 31 s (ref 24–36)
APTT: 30 s (ref 24–36)
APTT: 31 s (ref 24–36)
D DIMER QUANT: 9.78 ug{FEU}/mL — AB (ref 0.00–0.50)
FIBRINOGEN: 305 mg/dL (ref 210–475)
FIBRINOGEN: 367 mg/dL (ref 210–475)
INR: 1.06
PLATELETS: 156 10*3/uL (ref 150–400)
PLATELETS: 126 10*3/uL — AB (ref 150–400)
SMEAR REVIEW: NONE SEEN
SMEAR REVIEW: NONE SEEN
SMEAR REVIEW: NONE SEEN

## 2015-12-03 LAB — CBC
HCT: 21.5 % — ABNORMAL LOW (ref 36.0–46.0)
HCT: 25.9 % — ABNORMAL LOW (ref 36.0–46.0)
Hemoglobin: 7.3 g/dL — ABNORMAL LOW (ref 12.0–15.0)
Hemoglobin: 9 g/dL — ABNORMAL LOW (ref 12.0–15.0)
MCH: 27 pg (ref 26.0–34.0)
MCH: 28.7 pg (ref 26.0–34.0)
MCHC: 34 g/dL (ref 30.0–36.0)
MCHC: 34.7 g/dL (ref 30.0–36.0)
MCV: 79.6 fL (ref 78.0–100.0)
MCV: 82.5 fL (ref 78.0–100.0)
PLATELETS: 158 10*3/uL (ref 150–400)
PLATELETS: 117 10*3/uL — AB (ref 150–400)
RBC: 2.7 MIL/uL — AB (ref 3.87–5.11)
RBC: 3.14 MIL/uL — ABNORMAL LOW (ref 3.87–5.11)
RDW: 16.2 % — AB (ref 11.5–15.5)
RDW: 15.7 % — AB (ref 11.5–15.5)
WBC: 25.2 10*3/uL — AB (ref 4.0–10.5)
WBC: 16.9 10*3/uL — AB (ref 4.0–10.5)

## 2015-12-03 LAB — COMPREHENSIVE METABOLIC PANEL
ALT: 9 U/L — AB (ref 14–54)
AST: 24 U/L (ref 15–41)
Albumin: 2.3 g/dL — ABNORMAL LOW (ref 3.5–5.0)
Alkaline Phosphatase: 129 U/L — ABNORMAL HIGH (ref 38–126)
Anion gap: 6 (ref 5–15)
BUN: 12 mg/dL (ref 6–20)
CHLORIDE: 105 mmol/L (ref 101–111)
CO2: 23 mmol/L (ref 22–32)
CREATININE: 0.91 mg/dL (ref 0.44–1.00)
Calcium: 8.2 mg/dL — ABNORMAL LOW (ref 8.9–10.3)
Glucose, Bld: 98 mg/dL (ref 65–99)
Potassium: 4 mmol/L (ref 3.5–5.1)
Sodium: 134 mmol/L — ABNORMAL LOW (ref 135–145)
Total Bilirubin: 0.6 mg/dL (ref 0.3–1.2)
Total Protein: 5.1 g/dL — ABNORMAL LOW (ref 6.5–8.1)

## 2015-12-03 LAB — PMP SCREEN PROFILE (10S), URINE
Amphetamine Screen, Ur: NEGATIVE ng/mL
BARBITURATE SCRN UR: NEGATIVE ng/mL
BENZODIAZEPINE SCREEN, URINE: NEGATIVE ng/mL
CREATININE(CRT), U: 122.2 mg/dL (ref 20.0–300.0)
Cannabinoids Ur Ql Scn: NEGATIVE ng/mL
Cocaine(Metab.)Screen, Urine: NEGATIVE ng/mL
Methadone Scn, Ur: NEGATIVE ng/mL
OPIATE SCRN UR: NEGATIVE ng/mL
Oxycodone+Oxymorphone Ur Ql Scn: POSITIVE ng/mL
PCP Scrn, Ur: NEGATIVE ng/mL
PH UR, DRUG SCRN: 6.6 (ref 4.5–8.9)
Propoxyphene, Screen: NEGATIVE ng/mL

## 2015-12-03 LAB — DIC (DISSEMINATED INTRAVASCULAR COAGULATION)PANEL
D-Dimer, Quant: >20 ug/mL-FEU — ABNORMAL HIGH (ref 0.00–0.50)
D-Dimer, Quant: >20 ug/mL-FEU — ABNORMAL HIGH (ref 0.00–0.50)
Fibrinogen: 132 mg/dL — ABNORMAL LOW (ref 210–475)
Fibrinogen: 218 mg/dL (ref 210–475)
INR: 1.31
INR: 1.11
INR: 0.94
Platelets: 116 10*3/uL — ABNORMAL LOW (ref 150–400)
Platelets: 108 10*3/uL — ABNORMAL LOW (ref 150–400)
Prothrombin Time: 16.4 seconds — ABNORMAL HIGH (ref 11.4–15.2)
Prothrombin Time: 14.4 seconds (ref 11.4–15.2)
Prothrombin Time: 13.8 seconds (ref 11.4–15.2)
Prothrombin Time: 12.6 seconds (ref 11.4–15.2)
aPTT: 30 seconds (ref 24–36)

## 2015-12-03 LAB — PROTEIN / CREATININE RATIO, URINE
CREATININE, URINE: 46 mg/dL
Protein Creatinine Ratio: 11.24 mg/mg{Cre} — ABNORMAL HIGH (ref 0.00–0.15)
TOTAL PROTEIN, URINE: 517 mg/dL

## 2015-12-03 LAB — LIPASE, BLOOD: Lipase: 54 U/L — ABNORMAL HIGH (ref 11–51)

## 2015-12-03 MED ORDER — OXYCODONE-ACETAMINOPHEN 5-325 MG PO TABS
2.0000 | ORAL_TABLET | ORAL | Status: DC | PRN
  Administered 2015-12-03 – 2015-12-05 (×12): 2 via ORAL
  Filled 2015-12-03 (×13): qty 2

## 2015-12-03 MED ORDER — ONDANSETRON HCL 4 MG/2ML IJ SOLN: 4.0000 mg | INTRAMUSCULAR | Status: DC | PRN

## 2015-12-03 MED ORDER — MISOPROSTOL 200 MCG PO TABS
ORAL_TABLET | ORAL | Status: AC
  Administered 2015-12-03: 800 ug
  Filled 2015-12-03: qty 5

## 2015-12-03 MED ORDER — PRENATAL MULTIVITAMIN CH
1.0000 | ORAL_TABLET | Freq: Every day | ORAL | Status: DC
  Administered 2015-12-03: 1 via ORAL
  Filled 2015-12-03: qty 1

## 2015-12-03 MED ORDER — MEASLES, MUMPS & RUBELLA VAC ~~LOC~~ INJ
0.5000 mL | INJECTION | Freq: Once | SUBCUTANEOUS | Status: DC
  Filled 2015-12-03: qty 0.5

## 2015-12-03 MED ORDER — DIBUCAINE 1 % RE OINT
1.0000 "application " | TOPICAL_OINTMENT | RECTAL | Status: DC | PRN
  Filled 2015-12-03: qty 28

## 2015-12-03 MED ORDER — TETANUS-DIPHTH-ACELL PERTUSSIS 5-2.5-18.5 LF-MCG/0.5 IM SUSP: 0.5000 mL | Freq: Once | INTRAMUSCULAR | Status: DC

## 2015-12-03 MED ORDER — ZOLPIDEM TARTRATE 5 MG PO TABS
5.0000 mg | ORAL_TABLET | Freq: Every evening | ORAL | Status: DC | PRN
  Administered 2015-12-03 – 2015-12-04 (×2): 5 mg via ORAL
  Filled 2015-12-03 (×2): qty 1

## 2015-12-03 MED ORDER — IBUPROFEN 600 MG PO TABS
600.0000 mg | ORAL_TABLET | Freq: Four times a day (QID) | ORAL | Status: DC
  Administered 2015-12-03 – 2015-12-05 (×9): 600 mg via ORAL
  Filled 2015-12-03 (×9): qty 1

## 2015-12-03 MED ORDER — DIPHENHYDRAMINE HCL 25 MG PO CAPS: 25.0000 mg | ORAL_CAPSULE | Freq: Four times a day (QID) | ORAL | Status: DC | PRN

## 2015-12-03 MED ORDER — DIPHENHYDRAMINE HCL 25 MG PO CAPS: 25.0000 mg | ORAL_CAPSULE | Freq: Once | ORAL | Status: DC

## 2015-12-03 MED ORDER — RHO D IMMUNE GLOBULIN 1500 UNIT/2ML IJ SOSY
300.0000 ug | PREFILLED_SYRINGE | Freq: Once | INTRAMUSCULAR | Status: AC
  Administered 2015-12-03: 300 ug via INTRAVENOUS
  Filled 2015-12-03: qty 2

## 2015-12-03 MED ORDER — HYDROXYZINE HCL 50 MG PO TABS
50.0000 mg | ORAL_TABLET | Freq: Three times a day (TID) | ORAL | Status: DC | PRN
  Administered 2015-12-03 – 2015-12-05 (×7): 50 mg via ORAL
  Filled 2015-12-03 (×10): qty 1

## 2015-12-03 MED ORDER — BENZOCAINE-MENTHOL 20-0.5 % EX AERO: 1.0000 "application " | INHALATION_SPRAY | CUTANEOUS | Status: DC | PRN

## 2015-12-03 MED ORDER — OXYCODONE-ACETAMINOPHEN 5-325 MG PO TABS: 1.0000 | ORAL_TABLET | ORAL | Status: DC | PRN

## 2015-12-03 MED ORDER — LORAZEPAM 1 MG PO TABS: 1.0000 mg | ORAL_TABLET | Freq: Four times a day (QID) | ORAL | Status: DC | PRN

## 2015-12-03 MED ORDER — SODIUM CHLORIDE 0.9 % IV SOLN
Freq: Once | INTRAVENOUS | Status: AC
  Administered 2015-12-03: 02:00:00 via INTRAVENOUS

## 2015-12-03 MED ORDER — SERTRALINE HCL 50 MG PO TABS
50.0000 mg | ORAL_TABLET | Freq: Every day | ORAL | Status: DC
  Administered 2015-12-03 – 2015-12-04 (×2): 50 mg via ORAL
  Filled 2015-12-03 (×2): qty 1

## 2015-12-03 MED ORDER — METHYLERGONOVINE MALEATE 0.2 MG/ML IJ SOLN
INTRAMUSCULAR | Status: AC
  Filled 2015-12-03: qty 1

## 2015-12-03 MED ORDER — PROMETHAZINE HCL 25 MG PO TABS: 25.0000 mg | ORAL_TABLET | Freq: Four times a day (QID) | ORAL | Status: DC | PRN

## 2015-12-03 MED ORDER — SIMETHICONE 80 MG PO CHEW: 80.0000 mg | CHEWABLE_TABLET | ORAL | Status: DC | PRN

## 2015-12-03 MED ORDER — WITCH HAZEL-GLYCERIN EX PADS: 1.0000 "application " | MEDICATED_PAD | CUTANEOUS | Status: DC | PRN

## 2015-12-03 MED ORDER — FERROUS SULFATE 325 (65 FE) MG PO TABS
325.0000 mg | ORAL_TABLET | Freq: Two times a day (BID) | ORAL | Status: DC
  Administered 2015-12-03 – 2015-12-05 (×5): 325 mg via ORAL
  Filled 2015-12-03 (×5): qty 1

## 2015-12-03 MED ORDER — ACETAMINOPHEN 325 MG PO TABS: 650.0000 mg | ORAL_TABLET | Freq: Once | ORAL | Status: DC

## 2015-12-03 MED ORDER — ONDANSETRON HCL 4 MG PO TABS: 4.0000 mg | ORAL_TABLET | ORAL | Status: DC | PRN

## 2015-12-03 MED ORDER — ACETAMINOPHEN 325 MG PO TABS
650.0000 mg | ORAL_TABLET | ORAL | Status: DC | PRN
Start: 2015-12-03 — End: 2015-12-05

## 2015-12-03 MED ORDER — MAGNESIUM HYDROXIDE 400 MG/5ML PO SUSP: 30.0000 mL | ORAL | Status: DC | PRN

## 2015-12-03 MED ORDER — COCONUT OIL OIL: 1.0000 "application " | TOPICAL_OIL | Status: DC | PRN

## 2015-12-03 NOTE — Progress Notes (Signed)
IV d/c per order for pt safety because pt continues to leave nursing unit to go outside for "fresh air" but returns smelling like cigarette smoke. Pt was advised not to leave the floor d/t having IV, pt states she has to go anyway and left. Pt returned to room not long thereafter approximately later.

## 2015-12-03 NOTE — Anesthesia Postprocedure Evaluation (Signed)
Anesthesia Post Note  Patient: Amber BirkKatie C Cupo  Procedure(s) Performed: * No procedures listed *  Patient location during evaluation: Women's Unit Anesthesia Type: Epidural Level of consciousness: awake, awake and alert, oriented and patient cooperative Pain management: pain level controlled Vital Signs Assessment: post-procedure vital signs reviewed and stable Respiratory status: spontaneous breathing Cardiovascular status: blood pressure returned to baseline Postop Assessment: no headache, no backache, epidural receding, adequate PO intake and no signs of nausea or vomiting Anesthetic complications: no     Last Vitals:  Vitals:   12/03/15 0952 12/03/15 1336  BP: (!) 142/92 (!) 141/99  Pulse: 90 90  Resp: 18 18  Temp: 36.9 C 36.4 C    Last Pain:  Vitals:   12/03/15 1336  TempSrc: Oral  PainSc:    Pain Goal: Patients Stated Pain Goal: 5 (12/03/15 1330)               Jennye MoccasinSterling, Weldon Pickingharlesetta Marie

## 2015-12-03 NOTE — Progress Notes (Signed)
I spent time with family over several visits today.  [name] is very tearful, at times making it difficult for her to breathe.  She has a history of 24 week fetal demise of her first baby and she feels angry at God for taking a second child, [name], but is trying to maintain some faith. She was processing today and preferred to have some time alone with her SO to do so.  I spoke with [name] who received a consult due to chronic substance use and we agreed that now is not an appropriate time to speak with her about her use of prescribed opiates that she uses to cope with her chronic pancreatitis.    We will check in again tomorrow to offer additional support, but please page if needs arise over night.    Chaplain [name], Bcc Pager, [phone] 4:57 PM    12/03/15 1600  Clinical Encounter Type  Visited With Patient and family together  Visit Type Spiritual support  Referral From Nurse  Spiritual Encounters  Spiritual Needs Emotional;Grief support  Stress Factors  Patient Stress Factors Loss (Perinatal loss)

## 2015-12-03 NOTE — Progress Notes (Signed)
Patient ID: Amber BirkKatie C Dunn, female   DOB: 19-Sep-1985, 30 y.o.   MRN: [id number] Urine output decreased at time of delivery--10 cc/3 hours. Pt also mildly tachycardic, but bleeding stable after delivery. Transfusion of 2 units PRBC's ordered.

## 2015-12-04 LAB — RH IG WORKUP (INCLUDES ABO/RH)
ABO/RH(D): A NEG
FETAL SCREEN: NEGATIVE
Gestational Age(Wks): 34.5
Unit division: 0

## 2015-12-04 LAB — CBC
HCT: 24.1 % — ABNORMAL LOW (ref 36.0–46.0)
HEMOGLOBIN: 8.1 g/dL — AB (ref 12.0–15.0)
MCH: 28.1 pg (ref 26.0–34.0)
MCHC: 33.6 g/dL (ref 30.0–36.0)
MCV: 83.7 fL (ref 78.0–100.0)
PLATELETS: 118 10*3/uL — AB (ref 150–400)
RBC: 2.88 MIL/uL — AB (ref 3.87–5.11)
RDW: 16 % — ABNORMAL HIGH (ref 11.5–15.5)
WBC: 12.9 10*3/uL — ABNORMAL HIGH (ref 4.0–10.5)

## 2015-12-04 LAB — COMPREHENSIVE METABOLIC PANEL
ALBUMIN: 2.2 g/dL — AB (ref 3.5–5.0)
ALT: 12 U/L — AB (ref 14–54)
ANION GAP: 5 (ref 5–15)
AST: 17 U/L (ref 15–41)
Alkaline Phosphatase: 116 U/L (ref 38–126)
BUN: 8 mg/dL (ref 6–20)
CHLORIDE: 107 mmol/L (ref 101–111)
CO2: 23 mmol/L (ref 22–32)
Calcium: 8.3 mg/dL — ABNORMAL LOW (ref 8.9–10.3)
Creatinine, Ser: 0.86 mg/dL (ref 0.44–1.00)
GFR calc non Af Amer: >60 mL/min (ref >60)
GLUCOSE: 107 mg/dL — AB (ref 65–99)
Potassium: 4.3 mmol/L (ref 3.5–5.1)
SODIUM: 135 mmol/L (ref 135–145)
Total Bilirubin: 0.2 mg/dL — ABNORMAL LOW (ref 0.3–1.2)
Total Protein: 5.4 g/dL — ABNORMAL LOW (ref 6.5–8.1)

## 2015-12-04 LAB — DIC (DISSEMINATED INTRAVASCULAR COAGULATION) PANEL
INR: 0.94
PROTHROMBIN TIME: 12.6 s (ref 11.4–15.2)

## 2015-12-04 LAB — DIC (DISSEMINATED INTRAVASCULAR COAGULATION)PANEL
D-Dimer, Quant: 3.71 ug/mL-FEU — ABNORMAL HIGH (ref 0.00–0.50)
Fibrinogen: 378 mg/dL (ref 210–475)
Platelets: 119 10*3/uL — ABNORMAL LOW (ref 150–400)
aPTT: 27 seconds (ref 24–36)

## 2015-12-04 LAB — RPR: RPR: NONREACTIVE

## 2015-12-04 NOTE — Consult Note (Signed)
Brief lactation consult with this mom who delivered a LPI  Who was an IUFD. Mom hugged me and cried when I walked in the room. I told her I was sorry for her loss, and was here to help with breast tenderness from milk coming in. Mom said she is doing some hand expression to relieve pressure. I gave her the comfort folder, and reviewed this with her, and gave her the sage tea recipe.Mom knows to call for questions/concerns.

## 2015-12-04 NOTE — Progress Notes (Signed)
Follow up visit with Amber FetchKatie Gorley after the IUFD of her son Jeri ModenaJeremiah.  She reports that her 4616 mo son came to visit today and was a welcome distraction.  She acknowledged the wave of emotions she's been feeling throughout the day and is hopeful that her platelet count will be normal so she can go home soon.  She shared that she has a lot of support there and is hopeful that she will be able to get some rest.  She was tearful during our visit, but is processing her loss appropriately.  Please page as further needs arise.  Maryanna ShapeAmanda M. Carley Hammedavee Lomax, M.Div. [facility] Chaplain Pager [phone] Office [phone]

## 2015-12-04 NOTE — Progress Notes (Signed)
Post Partum Day 1 Subjective: no complaints, up ad lib, voiding and tolerating PO  Has spoken w/ Chaplain Denies HA, vision changes or epigastric pain   Objective: Blood pressure 134/76, pulse 85, temperature 98.7 F (37.1 C), temperature source Oral, resp. rate 18, height 5\' 4"  (1.626 m), weight 166 lb (75.3 kg), last menstrual period 04/04/2015, SpO2 99 %, unknown if currently breastfeeding.  Physical Exam:  General: alert, cooperative, appears older than stated age and no distress Lochia: appropriate Uterine Fundus: firm Incision: NA DVT Evaluation: Negative Homan's sign.   Recent Labs  Results for orders placed or performed during the hospital encounter of 12/02/15 (from the past 24 hour(s))  DIC (disseminated intravasc coag) panel     Status: Abnormal   Collection Time: 12/03/15  2:01 PM  Result Value Ref Range   Prothrombin Time 13.8 11.4 - 15.2 seconds   INR 1.06    aPTT 31 24 - 36 seconds   Fibrinogen 305 210 - 475 mg/dL   D-Dimer, Quant >21.30>20.00 (H) 0.00 - 0.50 ug/mL-FEU   Platelets 108 (L) 150 - 400 K/uL   Smear Review NO SCHISTOCYTES SEEN   Rh IG workup (includes ABO/Rh)     Status: None   Collection Time: 12/03/15  3:41 PM  Result Value Ref Range   Gestational Age(Wks) 34.5    ABO/RH(D) A NEG    Fetal Screen NEG    Unit Number [id number]    Blood Component Type RHIG    Unit division 00    Status of Unit ISSUED,FINAL    Transfusion Status OK TO TRANSFUSE   RPR     Status: None   Collection Time: 12/03/15  3:41 PM  Result Value Ref Range   RPR Ser Ql Non Reactive Non Reactive  DIC (disseminated intravasc coag) panel     Status: Abnormal   Collection Time: 12/03/15  8:22 PM  Result Value Ref Range   Prothrombin Time 12.6 11.4 - 15.2 seconds   INR 0.94    aPTT 30 24 - 36 seconds   Fibrinogen 367 210 - 475 mg/dL   D-Dimer, Quant 8.419.78 (H) 0.00 - 0.50 ug/mL-FEU   Platelets 126 (L) 150 - 400 K/uL   Smear Review NO SCHISTOCYTES SEEN   DIC (disseminated  intravasc coag) panel     Status: Abnormal   Collection Time: 12/04/15  1:52 AM  Result Value Ref Range   Prothrombin Time 12.4 11.4 - 15.2 seconds   INR 0.92    aPTT 30 24 - 36 seconds   Fibrinogen 345 210 - 475 mg/dL   D-Dimer, Quant 3.244.34 (H) 0.00 - 0.50 ug/mL-FEU   Platelets 114 (L) 150 - 400 K/uL   Smear Review SCHISTOCYTES NOTED ON SMEAR   DIC (disseminated intravasc coag) panel     Status: Abnormal   Collection Time: 12/04/15  8:01 AM  Result Value Ref Range   Prothrombin Time 12.6 11.4 - 15.2 seconds   INR 0.94    aPTT 27 24 - 36 seconds   Fibrinogen 378 210 - 475 mg/dL   D-Dimer, Quant 4.013.71 (H) 0.00 - 0.50 ug/mL-FEU   Platelets 119 (L) 150 - 400 K/uL   Smear Review SCHISTOCYTES NOTED ON SMEAR    I&O -2400 ml  Assessment/Plan: S/P SVD IUFD - Coping appropriately   Intrapartum hemorrhage from abruption - S/P transfusion. Feeling much better - Ptls starting to turn the corner, D-dimer greatly improved - Stop DIC labs - CBC  Possible Pre-E--BP's, Proteinuria (although per  lab urine had large Hgb in it). BP's improving spontaneously. Hx of very labile BP's even out side of pregnancy per pt .  - Mag not indicated at this time.  - CMET  Decreased Urine output--Normal now  Plan for discharge tomorrow     LOS: 2 days   [place] Neave Lenger 12/04/2015, 10:35 AM

## 2015-12-05 LAB — PREPARE FRESH FROZEN PLASMA
UNIT DIVISION: 0
UNIT DIVISION: 0

## 2015-12-05 LAB — TYPE AND SCREEN
ABO/RH(D): A NEG
Antibody Screen: NEGATIVE
UNIT DIVISION: 0
UNIT DIVISION: 0
UNIT DIVISION: 0
Unit division: 0
Unit division: 0

## 2015-12-05 MED ORDER — ZOLPIDEM TARTRATE 5 MG PO TABS: 5.0000 mg | ORAL_TABLET | Freq: Every evening | ORAL | 0 refills | Status: DC | PRN

## 2015-12-05 MED ORDER — LORAZEPAM 1 MG PO TABS: 1.0000 mg | ORAL_TABLET | Freq: Three times a day (TID) | ORAL | 0 refills | Status: DC | PRN

## 2015-12-05 MED ORDER — IBUPROFEN 600 MG PO TABS: 600.0000 mg | ORAL_TABLET | Freq: Four times a day (QID) | ORAL | 0 refills | Status: DC

## 2015-12-05 MED ORDER — FERROUS SULFATE 325 (65 FE) MG PO TABS: 325.0000 mg | ORAL_TABLET | Freq: Two times a day (BID) | ORAL | 3 refills | Status: DC

## 2015-12-05 NOTE — Progress Notes (Addendum)
Pt discharged to home with significant other.  Condition stable.  Pt ambulated to car with Luisa DagoJ. Bass, NT.  No equipment for home ordered at discharge.  Pt's uncle is connecting pt with a funeral home.  Pt still uncertain name of funeral home.  Nurse Supervisor's phone number [phone] written on discharge papers and pt and significant other know to call when they know name of funeral home.

## 2015-12-05 NOTE — Discharge Instructions (Signed)
Vaginal Delivery, Care After °Refer to this sheet in the next few weeks. These instructions provide you with information on caring for yourself after your delivery. Your health care provider may also give you more specific instructions. Your treatment has been planned according to current medical practices, but problems sometimes occur. Call your health care provider if you have any problems or questions after your delivery. °WHAT TO EXPECT AFTER YOUR DELIVERY °After your delivery, it is typical to have the following:  °· You may feel pain in the vaginal area for several days after delivery. If you had an incision or a vaginal tear, the area will probably continue to be tender to the touch for several weeks. °· You may feel very fatigued after a vaginal delivery. °· You may have vaginal bleeding and discharge that will start out red, then become pink, then yellow, then white. Altogether, this usually lasts for about 6 weeks. °· The combination of having lost your baby and changing hormones from the delivery can make you feel very sad. You may also experience emotions that change very quickly. Some of the emotions people often notice after loss include: °¨ Anger. °¨ Denial. °¨ Guilt. °¨ Sorrow. °¨ Depression. °¨ Grief. °¨ Relationship problems. °HOME CARE INSTRUCTIONS  °· Consider seeking support for your loss. Some forms of support that you might consider include your religious leader, friends, family, a professional counselor, or a bereavement support group. °· Take medicines only as directed by your health care provider. °· Continue to use good perineal care. Good perineal care includes:   °¨ Wiping your perineum from front to back.   °¨ Keeping your perineum clean.   °· Do not use tampons or douche until your health care provider says it is okay.   °· Shower, wash your hair, and take tub baths as directed by your health care provider.   °· Wear a well-fitting bra that provides breast support.   °· Drink enough  fluids to keep your urine clear or pale yellow.   °· Eat healthy foods.   °· Eat high-fiber foods every day, such as whole grain cereals and breads, brown rice, beans, and fresh fruits and vegetables. These foods may help prevent or relieve constipation.   °· Follow your health care provider's directions about resuming activities such as climbing stairs, driving, lifting, exercising, or traveling.   °· Increase your activities gradually.   °· Talk to your health care provider about resuming sexual activities. This depends on your risk of infection, your rate of healing, and your comfort and desire to resume sexual activity.   °· Try to have someone help you with your household activities for at least a few days after you leave the hospital.   °· Rest as much as possible. °· Keep all of your scheduled postpartum appointments. It is very important to keep your scheduled follow-up appointments. At these appointments, your health care provider will be checking to make sure that you are healing physically and emotionally.   °· Do not drink alcohol, especially if you are taking medicine to relieve pain.   °· Do not use any tobacco products including cigarettes, chewing tobacco, or electronic cigarettes. If you need help quitting, ask your health care provider. °· Do not use illegal drugs.   °SEEK MEDICAL CARE IF:  °· You feel sad or depressed.   °· You have thoughts of hurting yourself. °· You are having trouble eating or sleeping. °· You cannot enjoy the things in life you have previously enjoyed. °· You are passing large clots from your vagina. Save any clots to   show your health care provider.   °· You have a bad smelling discharge from your vagina.   °· You have trouble urinating.   °· You are urinating frequently.   °· You have pain when you urinate.   °· You have a change in your bowel movements.   °· You have increasing redness, pain, or swelling near your incision or vaginal tear.   °· You have pus draining from  your incision or vaginal tear.   °· Your incision or vaginal tear is separating.   °· You have painful, hard, or reddened breasts.   °· You have a severe headache.   °· You have blurred vision or see spots.   °· You are dizzy or light-headed.   °· You have a rash.   °· You have nausea or vomiting.   °· You have not had a menstrual period by the 12th week after delivery.   °· You have a fever.   °SEEK IMMEDIATE MEDICAL CARE IF:  °· You are concerned that you may hurt yourself or you are considering suicide.   °· You have persistent pain.   °· You have chest pain.   °· You have shortness of breath.   °· You faint.   °· You have leg pain.   °· You have stomach pain.   °· Your vaginal bleeding saturates two or more sanitary pads in 1 hour.   °MAKE SURE YOU: °· Understand these instructions.   °· Will watch your condition.   °· Will get help right away if you are not doing well or get worse.   °  °This information is not intended to replace advice given to you by your health care provider. Make sure you discuss any questions you have with your health care provider. °  °Document Released: 08/20/2013 Document Reviewed: 08/20/2013 °Elsevier Interactive Patient Education ©2016 [employer] ° ° °

## 2015-12-05 NOTE — Discharge Summary (Addendum)
Obstetric Discharge Summary Reason for Admission: third trimester bleeding Prenatal Procedures: ultrasound Intrapartum Procedures: spontaneous vaginal delivery Postpartum Procedures: transfusion 2 units Complications-Operative and Postpartum: hemorrhage and with fetal death Hemoglobin  Date Value Ref Range Status  12/04/2015 8.1 (L) 12.0 - 15.0 g/dL Final   HCT  Date Value Ref Range Status  12/04/2015 24.1 (L) 36.0 - 46.0 % Final    Physical Exam:  General: alert and no distress Lochia: appropriate Uterine Fundus: firm DVT Evaluation: No evidence of DVT seen on physical exam.  Discharge Diagnoses: preterm pregnancy delivered with demise  Discharge Information: Date: 12/05/2015 Activity: pelvic rest Diet: routine Medications: pt on chronic pain meds.  Had Rx for oxy at home will NOT prescribe.  Give rx for 5 Ambien and 5 Xanax.   Condition: stable Instructions: refer to practice specific booklet Discharge to: home Follow-up Information    [facility] Follow up in 5 day(s).   Specialty:  Obstetrics and Gynecology Contact information: [address] [place] [id number] [phone]        Pt to be scheduled for PP salpingectomy with [facility]. Will keep appt on Wed for meds and to schedule tubal.   Newborn Data: Live born female  Birth Weight: 5 lb 4.3 oz (2390 g) APGAR: 0, 0  Fetal demise.  [facility]  HARRAWAY-SMITH, Blaire Hodsdon 12/05/2015, 7:34 AM

## 2015-12-06 LAB — DIC (DISSEMINATED INTRAVASCULAR COAGULATION) PANEL
D DIMER QUANT: 4.34 ug{FEU}/mL — AB (ref 0.00–0.50)
PLATELETS: 114 10*3/uL — AB (ref 150–400)
PROTHROMBIN TIME: 12.4 s (ref 11.4–15.2)

## 2015-12-06 LAB — DIC (DISSEMINATED INTRAVASCULAR COAGULATION)PANEL
Fibrinogen: 345 mg/dL (ref 210–475)
INR: 0.92
aPTT: 30 seconds (ref 24–36)

## 2015-12-09 ENCOUNTER — Encounter: Payer: Self-pay | Admitting: Obstetrics and Gynecology

## 2015-12-09 ENCOUNTER — Ambulatory Visit (INDEPENDENT_AMBULATORY_CARE_PROVIDER_SITE_OTHER): Payer: Medicaid Other | Admitting: Obstetrics and Gynecology

## 2015-12-09 ENCOUNTER — Encounter: Payer: Medicaid Other | Admitting: Obstetrics and Gynecology

## 2015-12-09 ENCOUNTER — Telehealth: Payer: Self-pay | Admitting: *Deleted

## 2015-12-09 VITALS — BP 138/90 | HR 96 | Ht 64.0 in | Wt 155.2 lb

## 2015-12-09 DIAGNOSIS — F4323 Adjustment disorder with mixed anxiety and depressed mood: Secondary | ICD-10-CM | POA: Diagnosis not present

## 2015-12-09 DIAGNOSIS — O4593 Premature separation of placenta, unspecified, third trimester: Secondary | ICD-10-CM

## 2015-12-09 DIAGNOSIS — IMO0002 Reserved for concepts with insufficient information to code with codable children: Secondary | ICD-10-CM

## 2015-12-09 DIAGNOSIS — K859 Acute pancreatitis without necrosis or infection, unspecified: Secondary | ICD-10-CM

## 2015-12-09 DIAGNOSIS — K861 Other chronic pancreatitis: Secondary | ICD-10-CM | POA: Diagnosis not present

## 2015-12-09 MED ORDER — DIAZEPAM 5 MG PO TABS: 5.0000 mg | ORAL_TABLET | Freq: Three times a day (TID) | ORAL | 0 refills | Status: DC | PRN

## 2015-12-09 MED ORDER — OXYCODONE HCL 10 MG PO TABS: 10.0000 mg | ORAL_TABLET | Freq: Four times a day (QID) | ORAL | 0 refills | Status: DC

## 2015-12-09 NOTE — Telephone Encounter (Signed)
Dr. Emelda FearFerguson spoke with pt by phone today

## 2015-12-09 NOTE — Progress Notes (Signed)
   Subjective:  Amber BirkKatie C Dunn is a 30 y.o. female who presents for a 1 weeks postpartum visit  Patient concerns:Lost the baby due to abruption Prenatal and intrapartum course notable for begin to bleed at home and called EMS. Fetal demise was confirmed upon arrival to the hospital patient was transfused 2 units packed cells. Since going home she is made [place] on [place].   Patient is not sexually active.   The following portions of the patient's history were reviewed and updated as appropriate: allergies, current medications, past family history, past medical history, past surgical history and problem list.  Review of Systems    patient is having agitation. The Ativan is sexually releasing ever inhibitions and resulting in anger for beginning about a half after she takes it. She wants to stop it. I would  try her on Valium 5 mg 3 times a day instead We've refilled the oxycodone 10 mg every 6 hours  Objective:  BP 138/90   Pulse 96   Ht 5\' 4"  (1.626 m)   Wt 155 lb 3.2 oz (70.4 kg)   BMI 26.64 kg/m   General:  alert, cooperative and no distress     Lungs: clear to auscultation bilaterally  Heart:  regular rate and rhythm, S1, S2 normal, no murmur                         Grief counseling, over 25 minutes spent visit in direct contact, then the usual documentation time, and time reviewing the chart and prepped   Assessment:  1.  postpartum exam.Grief reaction 2. Contraception: Patient was considering tubal but will hold off on that discussion 3.  Acute grief management  Plan:  Valium 5 mg up to 3 times a day dispensed 30, and 2 #2 refill oxycodone 10 mg every 6 hours 10 days Follow-up 10 days for prescription refill and discussion of contraception plans

## 2015-12-10 ENCOUNTER — Telehealth: Payer: Self-pay | Admitting: Obstetrics and Gynecology

## 2015-12-10 NOTE — Telephone Encounter (Signed)
Pt states Dr.Ferguson asked her to call back today to see how the Valium helped with sleep. Pt states the Valium helps with hysterics but not sleep.  Spoke with Dr.Ferguson recommended pt to continue Valium as prescribed and call back on Friday.   Pt verbalized understanding.

## 2015-12-10 NOTE — Anesthesia Postprocedure Evaluation (Signed)
Anesthesia Post Note  Patient: [name]  Procedure(s) Performed: * No procedures listed *  Anesthesia Type: Epidural Vital Signs Assessment: post-procedure vital signs reviewed and stable Anesthetic complications: no    Last Vitals:  Vitals:   12/04/15 2355 12/05/15 0550  BP: (!) 145/93 135/62  Pulse: 91 88  Resp:  18  Temp:  36.7 C    Last Pain:  Vitals:   12/05/15 0930  TempSrc:   PainSc: 7                  Perle Brickhouse JENNETTE

## 2015-12-11 ENCOUNTER — Telehealth: Payer: Self-pay | Admitting: Obstetrics and Gynecology

## 2015-12-11 ENCOUNTER — Other Ambulatory Visit: Payer: Self-pay | Admitting: Obstetrics and Gynecology

## 2015-12-11 MED ORDER — ZOLPIDEM TARTRATE 5 MG PO TABS: 5.0000 mg | ORAL_TABLET | Freq: Every evening | ORAL | 0 refills | Status: DC | PRN

## 2015-12-11 NOTE — Telephone Encounter (Signed)
Pt rather pitiful, describes nightmares, allegedly resolved with ambien 5. I wll refil rx for  ambien 5 x 5 tabs to last til followup appt 5 days No ambien past that date.

## 2015-12-11 NOTE — Telephone Encounter (Signed)
Pt states Valium is not helping with sleep and dreams that makes pt feel like she is losing the baby all over again. Pt states when she was discharged from the hospital was given Ambien and she was able to rest and not have the dreams. Pt requesting a RX for the Ambien.   Pt was offered an appt but states she is not sure she will have transportation and is having to prepare for the infants funeral services and is not sure she can make it for an appt.

## 2015-12-11 NOTE — Progress Notes (Signed)
Given refill for 5 Ambien to last past the baby's funeral ar.patient is follow-up for next week

## 2015-12-12 ENCOUNTER — Ambulatory Visit: Payer: Self-pay | Admitting: Obstetrics and Gynecology

## 2015-12-18 ENCOUNTER — Encounter: Payer: Self-pay | Admitting: Obstetrics and Gynecology

## 2015-12-18 ENCOUNTER — Ambulatory Visit (INDEPENDENT_AMBULATORY_CARE_PROVIDER_SITE_OTHER): Payer: Medicaid Other | Admitting: Obstetrics and Gynecology

## 2015-12-18 VITALS — BP 120/88 | HR 108 | Ht 64.0 in | Wt 149.6 lb

## 2015-12-18 DIAGNOSIS — F1120 Opioid dependence, uncomplicated: Secondary | ICD-10-CM | POA: Diagnosis not present

## 2015-12-18 DIAGNOSIS — F4322 Adjustment disorder with anxiety: Secondary | ICD-10-CM

## 2015-12-18 DIAGNOSIS — K861 Other chronic pancreatitis: Secondary | ICD-10-CM

## 2015-12-18 DIAGNOSIS — R110 Nausea: Secondary | ICD-10-CM

## 2015-12-18 MED ORDER — ZOLPIDEM TARTRATE 5 MG PO TABS: 5.0000 mg | ORAL_TABLET | Freq: Every evening | ORAL | 0 refills | Status: DC | PRN

## 2015-12-18 MED ORDER — DIAZEPAM 5 MG PO TABS: 5.0000 mg | ORAL_TABLET | Freq: Three times a day (TID) | ORAL | 0 refills | Status: DC | PRN

## 2015-12-18 MED ORDER — OXYCODONE HCL 10 MG PO TABS: 10.0000 mg | ORAL_TABLET | Freq: Four times a day (QID) | ORAL | 0 refills | Status: DC

## 2015-12-18 NOTE — Progress Notes (Signed)
    Subjective:  Elijah BirkKatie C Kindle is a 30 y.o. female who presents for a 2 weeks postpartum visit After a fetal loss due to abruption at 35 weeks. Patient also has opiate dependence due to pancreatitis chronic, Patient concerns: She is here for medication refills. She states diazepam 5 every 8 hours has been helping with her anxiety. She has 4 diazepam tabs left. She is currently out of ativan, discontinued. She was given 40 tabs of Oxycodone 10 on the 22nd of August 2017; she has 6 left, this is a 34 tablets in 9 days, essentially on schedule. At this time she is reporting continued decrease appetite secondary to nausea;  she has only rarely been taking Phenergan with moderate relief. Pt has a h/o chronic pancreatitis.  Prenatal and intrapartum course notable for loss of the baby due to abruption. Pt had vaginal bleeding at home and called EMS. Fetal demise was confirmed upon arrival to the hospital;  patient was transfused 2 units packed cells. Funeral for the baby was a few days ago.   Patient is not sexually active.   The following portions of the patient's history were reviewed and updated as appropriate: allergies, current medications, past family history, past medical history, past surgical history and problem list.  Review of Systems    See Subjective, otherwise negative ROS.   Objective:  BP 120/88   Pulse (!) 108   Ht 5\' 4"  (1.626 m)   Wt 149 lb 9.6 oz (67.9 kg)   BMI 25.68 kg/m   General:  alert, cooperative and no distress  Psych Tearful     Grief counseling, over 25 minutes spent visit in direct contact, then the usual documentation time, and time reviewing the chart and prepped  Assessment:  1. Postpartum exam: Grief Reaction  2. Contraception: Patient was considering tubal but will hold off on that discussion. Discussed IUD vs Depo.  3. Acute grief management 4. Chronic pain management due to chronic pancreatitis 5. Opiate dependence due to chronic pancreatitis and need  for opiates  Plan:   1. Will refill:  Ambien 5 mg 5 tablets Oxycodone 10 mg for 7 days (32 Tabs)For R for breakthrough Valium 5 mg 28 tablets 2. Follow up in 1 week.  3. Patient must call compassionate friends within the next week  By signing my name below, I, Freida BusmanDiana Omoyeni, attest that this documentation has been prepared under the direction and in the presence of Tilda BurrowJohn V Ruhani Umland, MD . Electronically Signed: Freida Busmaniana Omoyeni, Scribe. 12/18/2015. 11:12 AM. I personally performed the services described in this documentation, which was SCRIBED in my presence. The recorded information has been reviewed and considered accurate. It has been edited as necessary during review. Tilda BurrowFERGUSON,Merve Hotard V, MD

## 2015-12-19 LAB — COMPREHENSIVE METABOLIC PANEL
A/G RATIO: 1.4 (ref 1.2–2.2)
ALT: 10 IU/L (ref 0–32)
AST: 12 IU/L (ref 0–40)
Albumin: 4.3 g/dL (ref 3.5–5.5)
Alkaline Phosphatase: 145 IU/L — ABNORMAL HIGH (ref 39–117)
BUN/Creatinine Ratio: 6 — ABNORMAL LOW (ref 9–23)
BUN: 5 mg/dL — AB (ref 6–20)
Bilirubin Total: <0.2 mg/dL (ref 0.0–1.2)
CALCIUM: 9.7 mg/dL (ref 8.7–10.2)
CO2: 20 mmol/L (ref 18–29)
CREATININE: 0.83 mg/dL (ref 0.57–1.00)
Chloride: 101 mmol/L (ref 96–106)
GFR, EST AFRICAN AMERICAN: 111 mL/min/{1.73_m2} (ref >59)
GFR, EST NON AFRICAN AMERICAN: 96 mL/min/{1.73_m2} (ref >59)
GLUCOSE: 111 mg/dL — AB (ref 65–99)
Globulin, Total: 3 g/dL (ref 1.5–4.5)
Potassium: 4.7 mmol/L (ref 3.5–5.2)
Sodium: 139 mmol/L (ref 134–144)
TOTAL PROTEIN: 7.3 g/dL (ref 6.0–8.5)

## 2015-12-19 LAB — AMYLASE: Amylase: 46 U/L (ref 31–124)

## 2015-12-22 ENCOUNTER — Telehealth (HOSPITAL_BASED_OUTPATIENT_CLINIC_OR_DEPARTMENT_OTHER): Payer: Self-pay | Admitting: *Deleted

## 2015-12-22 ENCOUNTER — Emergency Department (HOSPITAL_COMMUNITY)
Admission: EM | Admit: 2015-12-22 | Discharge: 2015-12-23 | Disposition: A | Discharge status: Home / Self Care | Payer: Medicaid Other | Attending: Emergency Medicine | Admitting: Emergency Medicine

## 2015-12-22 ENCOUNTER — Encounter (HOSPITAL_COMMUNITY): Payer: Self-pay

## 2015-12-22 DIAGNOSIS — Z79891 Long term (current) use of opiate analgesic: Secondary | ICD-10-CM | POA: Diagnosis not present

## 2015-12-22 DIAGNOSIS — F17210 Nicotine dependence, cigarettes, uncomplicated: Secondary | ICD-10-CM | POA: Diagnosis not present

## 2015-12-22 DIAGNOSIS — R112 Nausea with vomiting, unspecified: Secondary | ICD-10-CM | POA: Insufficient documentation

## 2015-12-22 DIAGNOSIS — R1013 Epigastric pain: Secondary | ICD-10-CM | POA: Insufficient documentation

## 2015-12-22 DIAGNOSIS — Z79899 Other long term (current) drug therapy: Secondary | ICD-10-CM | POA: Diagnosis not present

## 2015-12-22 LAB — CBC WITH DIFFERENTIAL/PLATELET
BASOS ABS: 0 10*3/uL (ref 0.0–0.1)
BASOS PCT: 0 %
EOS PCT: 4 %
Eosinophils Absolute: 0.5 10*3/uL (ref 0.0–0.7)
HCT: 41 % (ref 36.0–46.0)
Hemoglobin: 13.2 g/dL (ref 12.0–15.0)
LYMPHS PCT: 20 %
Lymphs Abs: 2.3 10*3/uL (ref 0.7–4.0)
MCH: 29.2 pg (ref 26.0–34.0)
MCHC: 32.2 g/dL (ref 30.0–36.0)
MCV: 90.7 fL (ref 78.0–100.0)
MONO ABS: 0.9 10*3/uL (ref 0.1–1.0)
Monocytes Relative: 8 %
Neutro Abs: 7.9 10*3/uL — ABNORMAL HIGH (ref 1.7–7.7)
Neutrophils Relative %: 68 %
PLATELETS: 460 10*3/uL — AB (ref 150–400)
RBC: 4.52 MIL/uL (ref 3.87–5.11)
RDW: 16.9 % — AB (ref 11.5–15.5)
WBC: 11.6 10*3/uL — AB (ref 4.0–10.5)

## 2015-12-22 LAB — I-STAT BETA HCG BLOOD, ED (MC, WL, AP ONLY)

## 2015-12-22 LAB — COMPREHENSIVE METABOLIC PANEL
ALT: 12 U/L — AB (ref 14–54)
AST: 15 U/L (ref 15–41)
Albumin: 4.2 g/dL (ref 3.5–5.0)
Alkaline Phosphatase: 109 U/L (ref 38–126)
Anion gap: 13 (ref 5–15)
BILIRUBIN TOTAL: 0.4 mg/dL (ref 0.3–1.2)
BUN: 9 mg/dL (ref 6–20)
CHLORIDE: 106 mmol/L (ref 101–111)
CO2: 21 mmol/L — ABNORMAL LOW (ref 22–32)
CREATININE: 0.71 mg/dL (ref 0.44–1.00)
Calcium: 9.6 mg/dL (ref 8.9–10.3)
Glucose, Bld: 111 mg/dL — ABNORMAL HIGH (ref 65–99)
POTASSIUM: 3.7 mmol/L (ref 3.5–5.1)
Sodium: 140 mmol/L (ref 135–145)
TOTAL PROTEIN: 7.6 g/dL (ref 6.5–8.1)

## 2015-12-22 LAB — LIPASE, BLOOD: LIPASE: 35 U/L (ref 11–51)

## 2015-12-22 MED ORDER — HYDROMORPHONE HCL 1 MG/ML IJ SOLN
1.0000 mg | Freq: Once | INTRAMUSCULAR | Status: AC
  Administered 2015-12-22: 1 mg via INTRAVENOUS
  Filled 2015-12-22: qty 1

## 2015-12-22 MED ORDER — DIPHENHYDRAMINE HCL 50 MG/ML IJ SOLN
25.0000 mg | Freq: Once | INTRAMUSCULAR | Status: AC
  Administered 2015-12-22: 25 mg via INTRAVENOUS
  Filled 2015-12-22: qty 1

## 2015-12-22 MED ORDER — KETOROLAC TROMETHAMINE 30 MG/ML IJ SOLN
15.0000 mg | Freq: Once | INTRAMUSCULAR | Status: AC
  Administered 2015-12-22: 15 mg via INTRAVENOUS
  Filled 2015-12-22: qty 1

## 2015-12-22 MED ORDER — SODIUM CHLORIDE 0.9 % IV BOLUS (SEPSIS)
1000.0000 mL | Freq: Once | INTRAVENOUS | Status: AC
  Administered 2015-12-22: 1000 mL via INTRAVENOUS

## 2015-12-22 MED ORDER — ONDANSETRON HCL 4 MG/2ML IJ SOLN
4.0000 mg | Freq: Once | INTRAMUSCULAR | Status: AC
  Administered 2015-12-22: 4 mg via INTRAVENOUS
  Filled 2015-12-22: qty 2
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
## 2015-12-22 NOTE — Telephone Encounter (Signed)
Post ED Visit - Positive Culture Follow-up: Unsuccessful Patient Follow-up  Culture assessed and recommendations reviewed by: []  Enzo BiNathan Batchelder, Pharm.D. []  Celedonio MiyamotoJeremy Frens, Pharm.D., BCPS []  Garvin FilaMike Maccia, Pharm.D. []  Georgina PillionElizabeth Martin, Pharm.D., BCPS []  KnappMinh Pham, [address]Pharm[address]D., BCPS, AAHIVP []  Estella HuskMichelle Turner, Pharm.D., BCPS, AAHIVP []  Tennis Mustassie Stewart, Pharm.D. []  Rob Oswaldo DoneVincent, [address]Pharm[address]  Positive urine culture  [x]  Patient discharged without antimicrobial prescription and treatment is now indicated []  Organism is resistant to prescribed ED discharge antimicrobial []  Patient with positive blood cultures   Unable to contact patient after 3 attempts, letter sent to address on file with no response.  No further treatment received.  [name], Jacklyne Baik [facility] 12/22/2015, 11:52 AM

## 2015-12-22 NOTE — ED Triage Notes (Signed)
Upper abdominal pain going into my back.  Having nausea and vomiting.  Feels like my pancreatitis.

## 2015-12-23 LAB — URINALYSIS, ROUTINE W REFLEX MICROSCOPIC
GLUCOSE, UA: NEGATIVE mg/dL
Nitrite: NEGATIVE
PH: 6 (ref 5.0–8.0)
SPECIFIC GRAVITY, URINE: 1.025 (ref 1.005–1.030)

## 2015-12-23 LAB — PROTEIN / CREATININE RATIO, URINE
Creatinine, Urine: 360.05 mg/dL
Protein Creatinine Ratio: 0.06 mg/mg{Cre} (ref 0.00–0.15)
Total Protein, Urine: 22 mg/dL

## 2015-12-23 LAB — URINE MICROSCOPIC-ADD ON

## 2015-12-23 LAB — RAPID URINE DRUG SCREEN, HOSP PERFORMED
AMPHETAMINES: NOT DETECTED
BARBITURATES: NOT DETECTED
Benzodiazepines: POSITIVE — AB
COCAINE: NOT DETECTED
OPIATES: POSITIVE — AB
TETRAHYDROCANNABINOL: NOT DETECTED

## 2015-12-23 MED ORDER — GI COCKTAIL ~~LOC~~
30.0000 mL | Freq: Once | ORAL | Status: AC
  Administered 2015-12-23: 30 mL via ORAL
  Filled 2015-12-23: qty 30

## 2015-12-23 MED ORDER — ONDANSETRON HCL 4 MG/2ML IJ SOLN
4.0000 mg | Freq: Once | INTRAMUSCULAR | Status: AC
  Administered 2015-12-23: 4 mg via INTRAVENOUS
  Filled 2015-12-23: qty 2

## 2015-12-23 MED ORDER — PANTOPRAZOLE SODIUM 20 MG PO TBEC: 20.0000 mg | DELAYED_RELEASE_TABLET | Freq: Every day | ORAL | 0 refills | Status: DC

## 2015-12-23 NOTE — ED Notes (Signed)
Updated the vitals made the doctor aware of results.

## 2015-12-23 NOTE — Discharge Instructions (Signed)
Take the Protonix before eating breakfast in the morning. Follow up with your primary care provider in 2 days if your symptoms are not improving.   Return to the emergency department if you experience fevers, uncontrolled vomiting, blood in your vomit, diarrhea, or any other concerning symptoms.

## 2015-12-23 NOTE — ED Notes (Signed)
MD at bedside. 

## 2015-12-25 ENCOUNTER — Ambulatory Visit: Payer: Medicaid Other | Admitting: Obstetrics and Gynecology

## 2015-12-26 ENCOUNTER — Ambulatory Visit (INDEPENDENT_AMBULATORY_CARE_PROVIDER_SITE_OTHER): Payer: Medicaid Other | Admitting: Obstetrics and Gynecology

## 2015-12-26 ENCOUNTER — Encounter: Payer: Self-pay | Admitting: Obstetrics and Gynecology

## 2015-12-26 VITALS — BP 124/90 | HR 94 | Ht 64.0 in | Wt 146.0 lb

## 2015-12-26 DIAGNOSIS — Z3009 Encounter for other general counseling and advice on contraception: Secondary | ICD-10-CM | POA: Diagnosis not present

## 2015-12-26 DIAGNOSIS — F4321 Adjustment disorder with depressed mood: Secondary | ICD-10-CM

## 2015-12-26 DIAGNOSIS — K861 Other chronic pancreatitis: Secondary | ICD-10-CM

## 2015-12-26 DIAGNOSIS — F4381 Prolonged grief disorder: Secondary | ICD-10-CM

## 2015-12-26 DIAGNOSIS — Z8719 Personal history of other diseases of the digestive system: Secondary | ICD-10-CM

## 2015-12-26 DIAGNOSIS — F4329 Adjustment disorder with other symptoms: Secondary | ICD-10-CM

## 2015-12-26 MED ORDER — DIAZEPAM 5 MG PO TABS: 5.0000 mg | ORAL_TABLET | Freq: Three times a day (TID) | ORAL | 0 refills | Status: DC | PRN

## 2015-12-26 MED ORDER — OXYCODONE HCL 10 MG PO TABS: 10.0000 mg | ORAL_TABLET | Freq: Four times a day (QID) | ORAL | 0 refills | Status: DC

## 2015-12-26 MED ORDER — NORETHINDRONE 0.35 MG PO TABS: 1.0000 | ORAL_TABLET | Freq: Every day | ORAL | 11 refills | Status: DC

## 2015-12-26 MED ORDER — PROMETHAZINE HCL 25 MG PO TABS: ORAL_TABLET | ORAL | 3 refills | Status: DC
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
## 2015-12-28 ENCOUNTER — Encounter (HOSPITAL_COMMUNITY)
Admission: RE | Admit: 2015-12-28 | Discharge: 2015-12-28 | Disposition: A | Discharge status: Home / Self Care | Payer: Medicaid Other | Source: Ambulatory Visit | Attending: Obstetrics and Gynecology | Admitting: Obstetrics and Gynecology

## 2016-01-01 ENCOUNTER — Telehealth: Payer: Self-pay | Admitting: Obstetrics and Gynecology

## 2016-01-01 DIAGNOSIS — F4329 Adjustment disorder with other symptoms: Secondary | ICD-10-CM | POA: Insufficient documentation

## 2016-01-01 DIAGNOSIS — F4381 Prolonged grief disorder: Secondary | ICD-10-CM | POA: Insufficient documentation

## 2016-01-01 NOTE — Telephone Encounter (Addendum)
Spoke with Dr.Ross's office, they will fax forms to be completed and we will fax back.   Referral completed-referral form faxed

## 2016-01-04 ENCOUNTER — Encounter (HOSPITAL_COMMUNITY): Payer: Self-pay | Admitting: Emergency Medicine

## 2016-01-04 ENCOUNTER — Emergency Department (HOSPITAL_COMMUNITY)
Admission: EM | Admit: 2016-01-04 | Discharge: 2016-01-05 | Disposition: A | Discharge status: Home / Self Care | Payer: Medicaid Other | Attending: Emergency Medicine | Admitting: Emergency Medicine

## 2016-01-04 DIAGNOSIS — R109 Unspecified abdominal pain: Secondary | ICD-10-CM | POA: Diagnosis present

## 2016-01-04 DIAGNOSIS — F17210 Nicotine dependence, cigarettes, uncomplicated: Secondary | ICD-10-CM | POA: Insufficient documentation

## 2016-01-04 DIAGNOSIS — K859 Acute pancreatitis without necrosis or infection, unspecified: Secondary | ICD-10-CM | POA: Insufficient documentation

## 2016-01-04 DIAGNOSIS — E876 Hypokalemia: Secondary | ICD-10-CM | POA: Diagnosis not present

## 2016-01-04 DIAGNOSIS — I10 Essential (primary) hypertension: Secondary | ICD-10-CM | POA: Insufficient documentation

## 2016-01-04 DIAGNOSIS — K861 Other chronic pancreatitis: Secondary | ICD-10-CM

## 2016-01-04 DIAGNOSIS — Z79899 Other long term (current) drug therapy: Secondary | ICD-10-CM | POA: Insufficient documentation

## 2016-01-04 MED ORDER — SODIUM CHLORIDE 0.9 % IV SOLN
1000.0000 mL | Freq: Once | INTRAVENOUS | Status: AC
  Administered 2016-01-05: 1000 mL via INTRAVENOUS

## 2016-01-04 MED ORDER — SODIUM CHLORIDE 0.9 % IV SOLN
1000.0000 mL | INTRAVENOUS | Status: DC
  Administered 2016-01-05: 1000 mL via INTRAVENOUS

## 2016-01-04 NOTE — ED Triage Notes (Signed)
Pt with hx of gastritis and pancreatitis and thinks that she is having a flare-up.
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
## 2016-01-05 LAB — CBC WITH DIFFERENTIAL/PLATELET
BASOS ABS: 0 10*3/uL (ref 0.0–0.1)
Basophils Relative: 1 %
EOS ABS: 0.9 10*3/uL — AB (ref 0.0–0.7)
Eosinophils Relative: 14 %
HCT: 39.4 % (ref 36.0–46.0)
HEMOGLOBIN: 12.4 g/dL (ref 12.0–15.0)
Lymphocytes Relative: 35 %
Lymphs Abs: 2.2 10*3/uL (ref 0.7–4.0)
MCH: 27.8 pg (ref 26.0–34.0)
MCHC: 31.5 g/dL (ref 30.0–36.0)
MCV: 88.3 fL (ref 78.0–100.0)
Monocytes Absolute: 0.7 10*3/uL (ref 0.1–1.0)
Monocytes Relative: 10 %
Neutro Abs: 2.6 10*3/uL (ref 1.7–7.7)
Neutrophils Relative %: 40 %
Platelets: 334 10*3/uL (ref 150–400)
RBC: 4.46 MIL/uL (ref 3.87–5.11)
RDW: 15.9 % — ABNORMAL HIGH (ref 11.5–15.5)
WBC: 6.4 10*3/uL (ref 4.0–10.5)

## 2016-01-05 LAB — COMPREHENSIVE METABOLIC PANEL WITH GFR
ALT: 10 U/L — ABNORMAL LOW (ref 14–54)
AST: 15 U/L (ref 15–41)
Albumin: 4.2 g/dL (ref 3.5–5.0)
Alkaline Phosphatase: 81 U/L (ref 38–126)
Anion gap: 9 (ref 5–15)
BUN: 5 mg/dL — ABNORMAL LOW (ref 6–20)
CO2: 24 mmol/L (ref 22–32)
Calcium: 9.3 mg/dL (ref 8.9–10.3)
Chloride: 106 mmol/L (ref 101–111)
Creatinine, Ser: 0.7 mg/dL (ref 0.44–1.00)
GFR calc Af Amer: >60 mL/min
GFR calc non Af Amer: >60 mL/min
Glucose, Bld: 117 mg/dL — ABNORMAL HIGH (ref 65–99)
Potassium: 3 mmol/L — ABNORMAL LOW (ref 3.5–5.1)
Sodium: 139 mmol/L (ref 135–145)
Total Bilirubin: 0.3 mg/dL (ref 0.3–1.2)
Total Protein: 7.1 g/dL (ref 6.5–8.1)

## 2016-01-05 LAB — RAPID URINE DRUG SCREEN, HOSP PERFORMED
Amphetamines: NOT DETECTED
Barbiturates: NOT DETECTED
Benzodiazepines: POSITIVE — AB
Cocaine: NOT DETECTED
Opiates: POSITIVE — AB
Tetrahydrocannabinol: NOT DETECTED

## 2016-01-05 LAB — URINALYSIS, ROUTINE W REFLEX MICROSCOPIC
Bilirubin Urine: NEGATIVE
Glucose, UA: NEGATIVE mg/dL
Hgb urine dipstick: NEGATIVE
Ketones, ur: NEGATIVE mg/dL
Leukocytes, UA: NEGATIVE
Nitrite: NEGATIVE
Protein, ur: NEGATIVE mg/dL
Specific Gravity, Urine: <1.005 — ABNORMAL LOW (ref 1.005–1.030)
pH: 6 (ref 5.0–8.0)

## 2016-01-05 LAB — LIPASE, BLOOD: LIPASE: 77 U/L — AB (ref 11–51)

## 2016-01-05 LAB — POC URINE PREG, ED: PREG TEST UR: NEGATIVE

## 2016-01-05 MED ORDER — DIPHENHYDRAMINE HCL 50 MG/ML IJ SOLN
25.0000 mg | Freq: Once | INTRAMUSCULAR | Status: AC
  Administered 2016-01-05: 25 mg via INTRAVENOUS
  Filled 2016-01-05: qty 1

## 2016-01-05 MED ORDER — POTASSIUM CHLORIDE CRYS ER 20 MEQ PO TBCR
40.0000 meq | EXTENDED_RELEASE_TABLET | Freq: Once | ORAL | Status: AC
  Administered 2016-01-05: 40 meq via ORAL
  Filled 2016-01-05: qty 2

## 2016-01-05 MED ORDER — HYDROMORPHONE HCL 1 MG/ML IJ SOLN
1.0000 mg | Freq: Once | INTRAMUSCULAR | Status: AC
  Administered 2016-01-05: 1 mg via INTRAVENOUS
  Filled 2016-01-05: qty 1

## 2016-01-05 MED ORDER — ONDANSETRON HCL 4 MG/2ML IJ SOLN
4.0000 mg | Freq: Once | INTRAMUSCULAR | Status: AC
  Administered 2016-01-05: 4 mg via INTRAVENOUS
  Filled 2016-01-05: qty 2

## 2016-01-05 MED ORDER — PROMETHAZINE HCL 25 MG/ML IJ SOLN
25.0000 mg | Freq: Once | INTRAMUSCULAR | Status: AC
  Administered 2016-01-05: 25 mg via INTRAVENOUS
  Filled 2016-01-05: qty 1

## 2016-01-05 MED ORDER — POTASSIUM CHLORIDE 10 MEQ/100ML IV SOLN
10.0000 meq | Freq: Once | INTRAVENOUS | Status: AC
  Administered 2016-01-05: 10 meq via INTRAVENOUS
  Filled 2016-01-05: qty 100
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
## 2016-01-06 ENCOUNTER — Emergency Department (HOSPITAL_COMMUNITY)
Admission: EM | Admit: 2016-01-06 | Discharge: 2016-01-07 | Disposition: A | Discharge status: Home / Self Care | Payer: Medicaid Other | Attending: Emergency Medicine | Admitting: Emergency Medicine

## 2016-01-06 DIAGNOSIS — F17210 Nicotine dependence, cigarettes, uncomplicated: Secondary | ICD-10-CM | POA: Insufficient documentation

## 2016-01-06 DIAGNOSIS — R112 Nausea with vomiting, unspecified: Secondary | ICD-10-CM | POA: Insufficient documentation

## 2016-01-06 DIAGNOSIS — G8929 Other chronic pain: Secondary | ICD-10-CM | POA: Insufficient documentation

## 2016-01-06 DIAGNOSIS — R1013 Epigastric pain: Secondary | ICD-10-CM | POA: Insufficient documentation

## 2016-01-06 DIAGNOSIS — R109 Unspecified abdominal pain: Secondary | ICD-10-CM

## 2016-01-06 DIAGNOSIS — I159 Secondary hypertension, unspecified: Secondary | ICD-10-CM | POA: Insufficient documentation

## 2016-01-06 LAB — COMPREHENSIVE METABOLIC PANEL
ALBUMIN: 4.2 g/dL (ref 3.5–5.0)
ALK PHOS: 82 U/L (ref 38–126)
ALT: 9 U/L — AB (ref 14–54)
AST: 14 U/L — ABNORMAL LOW (ref 15–41)
Anion gap: 9 (ref 5–15)
BUN: 8 mg/dL (ref 6–20)
CALCIUM: 9.5 mg/dL (ref 8.9–10.3)
CO2: 26 mmol/L (ref 22–32)
CREATININE: 0.73 mg/dL (ref 0.44–1.00)
Chloride: 105 mmol/L (ref 101–111)
GFR calc Af Amer: >60 mL/min (ref >60)
GFR calc non Af Amer: >60 mL/min (ref >60)
GLUCOSE: 112 mg/dL — AB (ref 65–99)
Potassium: 3.7 mmol/L (ref 3.5–5.1)
SODIUM: 140 mmol/L (ref 135–145)
TOTAL PROTEIN: 7.1 g/dL (ref 6.5–8.1)
Total Bilirubin: 0.2 mg/dL — ABNORMAL LOW (ref 0.3–1.2)

## 2016-01-06 LAB — CBC WITH DIFFERENTIAL/PLATELET
BASOS ABS: 0 10*3/uL (ref 0.0–0.1)
BASOS PCT: 0 %
EOS ABS: 0.8 10*3/uL — AB (ref 0.0–0.7)
Eosinophils Relative: 12 %
HCT: 38.3 % (ref 36.0–46.0)
Hemoglobin: 12.6 g/dL (ref 12.0–15.0)
Lymphocytes Relative: 40 %
Lymphs Abs: 2.7 10*3/uL (ref 0.7–4.0)
MCH: 29.2 pg (ref 26.0–34.0)
MCHC: 32.9 g/dL (ref 30.0–36.0)
MCV: 88.9 fL (ref 78.0–100.0)
MONO ABS: 0.8 10*3/uL (ref 0.1–1.0)
MONOS PCT: 11 %
NEUTROS PCT: 37 %
Neutro Abs: 2.5 10*3/uL (ref 1.7–7.7)
Platelets: 324 10*3/uL (ref 150–400)
RBC: 4.31 MIL/uL (ref 3.87–5.11)
RDW: 15.8 % — AB (ref 11.5–15.5)
WBC: 6.8 10*3/uL (ref 4.0–10.5)

## 2016-01-06 LAB — LIPASE, BLOOD: Lipase: 65 U/L — ABNORMAL HIGH (ref 11–51)

## 2016-01-06 LAB — AMYLASE: Amylase: 78 U/L (ref 28–100)

## 2016-01-06 MED ORDER — HYDROMORPHONE HCL 1 MG/ML IJ SOLN
1.0000 mg | Freq: Once | INTRAMUSCULAR | Status: AC
  Administered 2016-01-07: 1 mg via INTRAVENOUS
  Filled 2016-01-06: qty 1

## 2016-01-06 MED ORDER — ONDANSETRON HCL 4 MG/2ML IJ SOLN
4.0000 mg | Freq: Once | INTRAMUSCULAR | Status: AC
  Administered 2016-01-07: 4 mg via INTRAVENOUS
  Filled 2016-01-06: qty 2

## 2016-01-06 NOTE — ED Triage Notes (Signed)
Abdominal pain not getting any better, not able to control pain at home,  Lipase was elevated Sunday night. Pt is vomiting
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
## 2016-01-07 ENCOUNTER — Emergency Department (HOSPITAL_COMMUNITY): Payer: Medicaid Other

## 2016-01-07 MED ORDER — SODIUM CHLORIDE 0.9 % IV SOLN
1000.0000 mL | Freq: Once | INTRAVENOUS | Status: AC
  Administered 2016-01-07: 1000 mL via INTRAVENOUS

## 2016-01-07 MED ORDER — PROMETHAZINE HCL 25 MG/ML IJ SOLN
25.0000 mg | Freq: Once | INTRAMUSCULAR | Status: AC
Start: 2016-01-07 — End: 2016-01-07
  Administered 2016-01-07: 25 mg via INTRAVENOUS
  Filled 2016-01-07: qty 1

## 2016-01-07 MED ORDER — IOPAMIDOL (ISOVUE-300) INJECTION 61%
100.0000 mL | Freq: Once | INTRAVENOUS | Status: AC | PRN
  Administered 2016-01-07: 100 mL via INTRAVENOUS

## 2016-01-07 MED ORDER — HYDROMORPHONE HCL 1 MG/ML IJ SOLN
1.0000 mg | Freq: Once | INTRAMUSCULAR | Status: AC
  Administered 2016-01-07: 1 mg via INTRAVENOUS
  Filled 2016-01-07: qty 1

## 2016-01-07 MED ORDER — ONDANSETRON HCL 4 MG/2ML IJ SOLN
4.0000 mg | Freq: Once | INTRAMUSCULAR | Status: AC
  Administered 2016-01-07: 4 mg via INTRAVENOUS
  Filled 2016-01-07: qty 2

## 2016-01-07 NOTE — ED Notes (Signed)
Patient ambulatory to bathroom.

## 2016-01-08 ENCOUNTER — Telehealth: Payer: Self-pay | Admitting: Obstetrics and Gynecology

## 2016-01-08 ENCOUNTER — Telehealth (HOSPITAL_COMMUNITY): Payer: Self-pay | Admitting: *Deleted

## 2016-01-08 NOTE — Telephone Encounter (Signed)
LEFT VOICE MESSAGE REGARDING APPOINTMENT 

## 2016-01-08 NOTE — Telephone Encounter (Signed)
Pt given an appt with Dr.Ferguson for 09/252017 at 1:30 pm for narcotic refill.

## 2016-01-12 ENCOUNTER — Ambulatory Visit (INDEPENDENT_AMBULATORY_CARE_PROVIDER_SITE_OTHER): Payer: Medicaid Other | Admitting: Obstetrics and Gynecology

## 2016-01-12 ENCOUNTER — Encounter: Payer: Self-pay | Admitting: Obstetrics and Gynecology

## 2016-01-12 VITALS — BP 106/84 | HR 80 | Ht 64.0 in | Wt 143.0 lb

## 2016-01-12 DIAGNOSIS — K861 Other chronic pancreatitis: Secondary | ICD-10-CM

## 2016-01-12 DIAGNOSIS — F4321 Adjustment disorder with depressed mood: Secondary | ICD-10-CM | POA: Diagnosis not present

## 2016-01-12 DIAGNOSIS — K859 Acute pancreatitis without necrosis or infection, unspecified: Secondary | ICD-10-CM

## 2016-01-12 DIAGNOSIS — F4329 Adjustment disorder with other symptoms: Secondary | ICD-10-CM

## 2016-01-12 DIAGNOSIS — IMO0002 Reserved for concepts with insufficient information to code with codable children: Secondary | ICD-10-CM

## 2016-01-12 DIAGNOSIS — F4381 Prolonged grief disorder: Secondary | ICD-10-CM

## 2016-01-12 MED ORDER — OXYCODONE HCL 10 MG PO TABS: 10.0000 mg | ORAL_TABLET | Freq: Four times a day (QID) | ORAL | 0 refills | Status: DC
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
## 2016-01-20 ENCOUNTER — Ambulatory Visit (INDEPENDENT_AMBULATORY_CARE_PROVIDER_SITE_OTHER): Payer: Medicaid Other | Admitting: Women's Health

## 2016-01-20 ENCOUNTER — Emergency Department (HOSPITAL_COMMUNITY)
Admission: EM | Admit: 2016-01-20 | Discharge: 2016-01-21 | Discharge status: Against Medical Advice | Payer: Medicaid Other | Attending: Emergency Medicine | Admitting: Emergency Medicine

## 2016-01-20 ENCOUNTER — Encounter: Payer: Self-pay | Admitting: Women's Health

## 2016-01-20 ENCOUNTER — Encounter (HOSPITAL_COMMUNITY): Payer: Self-pay | Admitting: Emergency Medicine

## 2016-01-20 DIAGNOSIS — R109 Unspecified abdominal pain: Secondary | ICD-10-CM

## 2016-01-20 DIAGNOSIS — F17210 Nicotine dependence, cigarettes, uncomplicated: Secondary | ICD-10-CM | POA: Diagnosis not present

## 2016-01-20 DIAGNOSIS — Z7289 Other problems related to lifestyle: Secondary | ICD-10-CM | POA: Insufficient documentation

## 2016-01-20 DIAGNOSIS — I10 Essential (primary) hypertension: Secondary | ICD-10-CM | POA: Diagnosis not present

## 2016-01-20 DIAGNOSIS — G8929 Other chronic pain: Secondary | ICD-10-CM | POA: Diagnosis not present

## 2016-01-20 DIAGNOSIS — Z765 Malingerer [conscious simulation]: Secondary | ICD-10-CM

## 2016-01-20 DIAGNOSIS — F329 Major depressive disorder, single episode, unspecified: Secondary | ICD-10-CM

## 2016-01-20 DIAGNOSIS — O09892 Supervision of other high risk pregnancies, second trimester: Secondary | ICD-10-CM

## 2016-01-20 DIAGNOSIS — R1010 Upper abdominal pain, unspecified: Secondary | ICD-10-CM | POA: Insufficient documentation

## 2016-01-20 DIAGNOSIS — O09212 Supervision of pregnancy with history of pre-term labor, second trimester: Secondary | ICD-10-CM

## 2016-01-20 DIAGNOSIS — F32A Depression, unspecified: Secondary | ICD-10-CM

## 2016-01-20 LAB — CBC
HEMATOCRIT: 45.8 % (ref 36.0–46.0)
Hemoglobin: 15.2 g/dL — ABNORMAL HIGH (ref 12.0–15.0)
MCH: 29.2 pg (ref 26.0–34.0)
MCHC: 33.2 g/dL (ref 30.0–36.0)
MCV: 87.9 fL (ref 78.0–100.0)
Platelets: 412 10*3/uL — ABNORMAL HIGH (ref 150–400)
RBC: 5.21 MIL/uL — AB (ref 3.87–5.11)
RDW: 14.9 % (ref 11.5–15.5)
WBC: 8.6 10*3/uL (ref 4.0–10.5)

## 2016-01-20 LAB — COMPREHENSIVE METABOLIC PANEL
ALT: 18 U/L (ref 14–54)
AST: 19 U/L (ref 15–41)
Albumin: 4.4 g/dL (ref 3.5–5.0)
Alkaline Phosphatase: 86 U/L (ref 38–126)
Anion gap: 8 (ref 5–15)
BUN: 10 mg/dL (ref 6–20)
CHLORIDE: 104 mmol/L (ref 101–111)
CO2: 24 mmol/L (ref 22–32)
CREATININE: 0.8 mg/dL (ref 0.44–1.00)
Calcium: 9.7 mg/dL (ref 8.9–10.3)
Glucose, Bld: 122 mg/dL — ABNORMAL HIGH (ref 65–99)
POTASSIUM: 4 mmol/L (ref 3.5–5.1)
SODIUM: 136 mmol/L (ref 135–145)
Total Bilirubin: 0.4 mg/dL (ref 0.3–1.2)
Total Protein: 8 g/dL (ref 6.5–8.1)

## 2016-01-20 LAB — LIPASE, BLOOD: LIPASE: 32 U/L (ref 11–51)

## 2016-01-20 MED ORDER — SERTRALINE HCL 100 MG PO TABS: 100.0000 mg | ORAL_TABLET | Freq: Every day | ORAL | 3 refills | Status: DC

## 2016-01-20 NOTE — ED Triage Notes (Signed)
Pt reports abdominal pain and emesis that started today.

## 2016-01-20 NOTE — Patient Instructions (Signed)
Talk to Dr. Emelda FearFerguson tomorrow about colposcopy, if not needed right now, come after 05/18/16 for pap smear
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
## 2016-01-20 NOTE — Progress Notes (Signed)
Subjective:    [name] is a 30 y.o. [id number] Caucasian female who presents for a postpartum visit. She is 6 weeks postpartum following a spontaneous vaginal delivery at 34.5 gestational weeks, presented to mau w/ IUFD and suspected placental abruption which was confirmed at delivery. Had recurrent chronic pancreatitis attacks during pregnancy, was on chronic opioids, uds was also + at different times for benzo, barbiturates, thc).  Anesthesia: epidural. I have fully reviewed the prenatal and intrapartum course. Postpartum course has been uncomplicated. Bleeding no bleeding. Bowel function is normal. Bladder function is normal. Patient is sexually active. Last sexual activity: last night. Contraception method is oral progesterone-only contraceptive. Postpartum depression screening: positive. Score 25. States she has her good and bad days, definitely more bad than good. At times [facility] has [facility], then 'moma side' kicks in and she knows she has to stay here w/ the child who is living. Denies plan. Is currently on zoloft 50mg  daily which is not helping, has valium prn which she feels is not helping.  Last pap 05/19/15 and was ASCUS w/ +HRHPV, had colpo during pregnancy.  The following portions of the patient's history were reviewed and updated as appropriate: allergies, current medications, past medical history, past surgical history and problem list.  Review of Systems Pertinent items are noted in HPI.   Vitals:   01/20/16 1444  BP: 116/86  Pulse: 80  Weight: 140 lb (63.5 kg)   Patient's last menstrual period was 01/11/2016.  Objective:   General:  alert, cooperative and no distress   Breasts:  deferred, no complaints  Lungs: clear to auscultation bilaterally  Heart:  regular rate and rhythm  Abdomen: soft, nontender   Vulva: normal  Vagina: normal vagina  Cervix:  closed  Corpus: Well-involuted  Adnexa:  Non-palpable  Rectal Exam: No  hemorrhoids        Assessment:   Postpartum exam 6 wks s/p SVB of 34wk IUFD d/t placental abruption Depression/anxiety Recurrent chronic pancreatitis attacks during pregnancy Chronic opioid use Abnormal pap during pregnancy Depression screening Contraception counseling   Plan:  Increase Zoloft to 100mg  daily (from 50mg  daily), do not take valium if not helping Keep appt w/ Diannia Rudereborah Ross, psychiatrist on 11/5 as scheduled- at that time behavioral health meds will be managed by her Discussed option of grief share/heartstrings- not ready yet Contraception: continue micronor, make sure to take at exact same time daily Follow up in: 1 day for pre-op for BTL w/ J[facility] or earlier if needed Will need colpo 6-8wk pp- to discuss w/ [facility] tomorrow  Marge DuncansBooker, Alexxis Mackert Randall CNM, [facility]NP-BC 01/20/2016 3:20 PM

## 2016-01-21 ENCOUNTER — Encounter: Payer: Self-pay | Admitting: Obstetrics and Gynecology

## 2016-01-21 ENCOUNTER — Ambulatory Visit (INDEPENDENT_AMBULATORY_CARE_PROVIDER_SITE_OTHER): Payer: Medicaid Other | Admitting: Obstetrics and Gynecology

## 2016-01-21 VITALS — BP 88/70 | HR 86 | Ht 64.0 in | Wt 141.2 lb

## 2016-01-21 DIAGNOSIS — Z3009 Encounter for other general counseling and advice on contraception: Secondary | ICD-10-CM | POA: Diagnosis not present

## 2016-01-21 DIAGNOSIS — Z01810 Encounter for preprocedural cardiovascular examination: Secondary | ICD-10-CM

## 2016-01-21 DIAGNOSIS — Z302 Encounter for sterilization: Secondary | ICD-10-CM | POA: Diagnosis not present

## 2016-01-21 MED ORDER — DIPHENHYDRAMINE HCL 50 MG/ML IJ SOLN: 25.0000 mg | Freq: Once | INTRAMUSCULAR | Status: DC

## 2016-01-21 MED ORDER — PROCHLORPERAZINE EDISYLATE 5 MG/ML IJ SOLN: 10.0000 mg | Freq: Once | INTRAMUSCULAR | Status: DC

## 2016-01-21 MED ORDER — ONDANSETRON HCL 4 MG/2ML IJ SOLN: 4.0000 mg | Freq: Once | INTRAMUSCULAR | Status: DC

## 2016-01-21 MED ORDER — SODIUM CHLORIDE 0.9 % IV BOLUS (SEPSIS): 1000.0000 mL | Freq: Once | INTRAVENOUS | Status: DC

## 2016-01-21 MED ORDER — ONDANSETRON 8 MG PO TBDP
8.0000 mg | ORAL_TABLET | Freq: Once | ORAL | Status: AC
  Administered 2016-01-21: 8 mg via ORAL
  Filled 2016-01-21: qty 1

## 2016-01-21 NOTE — ED Notes (Signed)
Into check on patient after receiving Zofran ODT, patient not in room, per staff patient and her boyfriend were seen walking out. Dr. Lynelle DoctorKnapp informed.

## 2016-01-21 NOTE — ED Notes (Signed)
Patient asked if she could get her medications by pill so she wouldn't have to have an IV
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
## 2016-01-24 ENCOUNTER — Emergency Department (HOSPITAL_COMMUNITY)
Admission: EM | Admit: 2016-01-24 | Discharge: 2016-01-24 | Discharge status: Absent without leave | Payer: Medicaid Other

## 2016-01-25 ENCOUNTER — Encounter (HOSPITAL_COMMUNITY): Payer: Self-pay | Admitting: *Deleted

## 2016-01-25 ENCOUNTER — Emergency Department (HOSPITAL_COMMUNITY)
Admission: EM | Admit: 2016-01-25 | Discharge: 2016-01-25 | Discharge status: Against Medical Advice | Payer: Medicaid Other | Attending: Emergency Medicine | Admitting: Emergency Medicine

## 2016-01-25 DIAGNOSIS — R1013 Epigastric pain: Secondary | ICD-10-CM | POA: Diagnosis not present

## 2016-01-25 DIAGNOSIS — I10 Essential (primary) hypertension: Secondary | ICD-10-CM | POA: Diagnosis not present

## 2016-01-25 DIAGNOSIS — G8929 Other chronic pain: Secondary | ICD-10-CM | POA: Diagnosis not present

## 2016-01-25 DIAGNOSIS — R197 Diarrhea, unspecified: Secondary | ICD-10-CM | POA: Diagnosis not present

## 2016-01-25 DIAGNOSIS — Z79899 Other long term (current) drug therapy: Secondary | ICD-10-CM | POA: Diagnosis not present

## 2016-01-25 DIAGNOSIS — R112 Nausea with vomiting, unspecified: Secondary | ICD-10-CM | POA: Diagnosis not present

## 2016-01-25 DIAGNOSIS — R109 Unspecified abdominal pain: Secondary | ICD-10-CM

## 2016-01-25 DIAGNOSIS — F17210 Nicotine dependence, cigarettes, uncomplicated: Secondary | ICD-10-CM | POA: Insufficient documentation

## 2016-01-25 DIAGNOSIS — R1010 Upper abdominal pain, unspecified: Secondary | ICD-10-CM | POA: Diagnosis present

## 2016-01-25 MED ORDER — DIPHENHYDRAMINE HCL 50 MG/ML IJ SOLN
25.0000 mg | Freq: Once | INTRAMUSCULAR | Status: DC
  Filled 2016-01-25: qty 1

## 2016-01-25 MED ORDER — PROMETHAZINE HCL 25 MG/ML IJ SOLN
25.0000 mg | Freq: Once | INTRAMUSCULAR | Status: DC
  Filled 2016-01-25: qty 1

## 2016-01-25 MED ORDER — SODIUM CHLORIDE 0.9 % IV BOLUS (SEPSIS): 1000.0000 mL | Freq: Once | INTRAVENOUS | Status: DC
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
## 2016-01-25 NOTE — ED Notes (Signed)
Pt was advised of care plan by this RN. Pt states that she has been told specifically by Dr. Emelda FearFerguson that if her pain gets uncontrollable at home with her home meds, that she would need to come to the ED. Pt's concern was that due to her pain, she is unsure of how to gauge her home medication without possible overdose. Pt states she follow up with Dr. Emelda FearFerguson. Pt states she has an appt on Monday.

## 2016-01-25 NOTE — ED Triage Notes (Signed)
Pt c/o upper abdominal pain that radiates into her back. Pt also reports n/v/d.

## 2016-01-26 ENCOUNTER — Encounter: Payer: Self-pay | Admitting: Obstetrics and Gynecology

## 2016-01-26 ENCOUNTER — Ambulatory Visit (INDEPENDENT_AMBULATORY_CARE_PROVIDER_SITE_OTHER): Payer: Medicaid Other | Admitting: Obstetrics and Gynecology

## 2016-01-26 VITALS — BP 118/78 | HR 64 | Ht 64.0 in | Wt 141.0 lb

## 2016-01-26 DIAGNOSIS — K861 Other chronic pancreatitis: Secondary | ICD-10-CM

## 2016-01-26 DIAGNOSIS — K859 Acute pancreatitis without necrosis or infection, unspecified: Secondary | ICD-10-CM

## 2016-01-26 MED ORDER — OXYCODONE HCL 10 MG PO TABS: 10.0000 mg | ORAL_TABLET | Freq: Four times a day (QID) | ORAL | 0 refills | Status: DC
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
## 2016-01-27 ENCOUNTER — Encounter (HOSPITAL_COMMUNITY)
Admission: RE | Admit: 2016-01-27 | Discharge: 2016-01-27 | Disposition: A | Discharge status: Home / Self Care | Payer: Medicaid Other | Source: Ambulatory Visit | Attending: Obstetrics and Gynecology | Admitting: Obstetrics and Gynecology

## 2016-01-28 ENCOUNTER — Other Ambulatory Visit: Payer: Self-pay | Admitting: Obstetrics and Gynecology

## 2016-01-29 NOTE — Patient Instructions (Signed)
[name]  01/29/2016     @PREFPERIOPPHARMACY @   Your procedure is scheduled on  02/03/2016   Report to [facility] at  1140  A.M.  Call this number if you have problems the morning of surgery:  [phone]   Remember:  Do not eat food or drink liquids after midnight.  Take these medicines the morning of surgery with A SIP OF WATER  Valium, zofran, oxycodone, protonix, phenergan, zoloft.   Do not wear jewelry, make-up or nail polish.  Do not wear lotions, powders, or perfumes, or deoderant.  Do not shave 48 hours prior to surgery.  Men may shave face and neck.  Do not bring valuables to the hospital.  [facility] is not responsible for any belongings or valuables.  Contacts, dentures or bridgework may not be worn into surgery.  Leave your suitcase in the car.  After surgery it may be brought to your room.  For patients admitted to the hospital, discharge time will be determined by your treatment team.  Patients discharged the day of surgery will not be allowed to drive home.   Name and phone number of your driver:   family Special instructions:  none  Please read over the following fact sheets that you were given. Surgical Site Infection Prevention and Anesthesia Post-op Instructions       Laparoscopic Tubal Ligation Laparoscopic tubal ligation is a procedure that closes the fallopian tubes at a time other than right after childbirth. When the fallopian tubes are closed, the eggs that are released from the ovaries cannot enter the uterus, and sperm cannot reach the egg. Tubal ligation is also known as getting your "tubes tied." Tubal ligation is done so you will not be able to get pregnant or have a baby. Although this procedure may be undone (reversed), it should be considered permanent and irreversible. If you want to have future pregnancies, you should not have this procedure. LET [facility] CARE PROVIDER KNOW ABOUT:  Any allergies you  have.  All medicines you are taking, including vitamins, herbs, eye drops, creams, and over-the-counter medicines. This includes any use of steroids, either by mouth or in cream form.  Previous problems you or members of your family have had with the use of anesthetics.  Any blood disorders you have.  Previous surgeries you have had.  Any medical conditions you may have.  Possibility of pregnancy, if this applies.  Any past pregnancies. RISKS AND COMPLICATIONS  Infection.  Bleeding.  Injury to surrounding organs.  Side effects from anesthetics.  Failure of the procedure.  Ectopic pregnancy.  Future regret about having the procedure done. BEFORE THE PROCEDURE  Ask your health care provider about:  Changing or stopping your regular medicines. This is especially important if you are taking diabetes medicines or blood thinners.  Taking medicines such as aspirin and ibuprofen. These medicines can thin your blood. Do not take these medicines before your procedure if your health care provider instructs you not to.  Follow instructions from your health care provider about eating and drinking restrictions.  Plan to have someone take you home after the procedure.  If you go home right after the procedure, plan to have someone with you for 24 hours. PROCEDURE  You will be given one or more of the following:  A medicine that helps you relax (sedative).  A medicine that numbs the area (local anesthetic).  A medicine that  makes you fall asleep (general anesthetic).  A medicine that is injected into an area of your body that numbs everything below the injection site (regional anesthetic).  If you have been given general anesthetic, a tube will be put down your throat to help you breathe.  Two small cuts (incisions) will be made in the lower abdominal area and near the belly button.  Your bladder may be emptied with a small tube (catheter).  Your abdomen will be inflated  with a safe gas (carbon dioxide). This will help to give the surgeon room to operate and visualize, and it will help the surgeon to avoid other organs.  A thin, lighted tube (laparoscope) with a camera attached will be inserted into your abdomen through one of the incisions near the belly button. Other small instruments will be inserted through the other abdominal incision.  The fallopian tubes will be tied off or burned (cauterized), or they will be blocked with a clip, ring, or clamp. In many cases, a small portion in the center of each fallopian tube will also be removed.  After the fallopian tubes are blocked, the gas will be released from the abdomen.  The incisions will be closed with stitches (sutures).  A bandage (dressing) will be placed over the incisions. The procedure may vary among health care providers and hospitals. AFTER THE PROCEDURE  Your blood pressure, heart rate, breathing rate, and blood oxygen level will be monitored often until the medicines you were given have worn off.  You will be given pain medicine as needed.  If you had general anesthetic, you may have some mild discomfort in your throat. This is from the breathing tube that was placed in your throat while you were sleeping.  You may experience discomfort in the shoulder area from some trapped air between your liver and your diaphragm. This sensation is normal, and it will slowly go away on its own.  You will have some mild abdominal discomfort for 3--7 days.   This information is not intended to replace advice given to you by your health care provider. Make sure you discuss any questions you have with your health care provider.   Document Released: 07/12/2000 Document Revised: 08/20/2014 Document Reviewed: 07/17/2011 Elsevier Interactive Patient Education 2016 [employer] Laparoscopic Tubal Ligation, Care After Refer to this sheet in the next few weeks. These instructions provide you with information  about caring for yourself after your procedure. Your health care provider may also give you more specific instructions. Your treatment has been planned according to current medical practices, but problems sometimes occur. Call your health care provider if you have any problems or questions after your procedure. WHAT TO EXPECT AFTER THE PROCEDURE After your procedure, it is common to have:  Sore throat.  Soreness at the incision site.  Mild cramping.  Tiredness.  Mild nausea or vomiting.  Shoulder pain. HOME CARE INSTRUCTIONS  Rest for the remainder of the day.  Take medicines only as directed by your health care provider. These include over-the-counter medicines and prescription medicines. Do not take aspirin, which can cause bleeding.  Over the next few days, gradually return to your normal activities and your normal diet.  Avoid sexual intercourse for 2 weeks or as directed by your health care provider.  Do not use tampons, and do not douche.  Do not drive or operate heavy machinery while taking pain medicine.  Do not lift anything that is heavier than 5 lb (2.3 kg) for 2 weeks or  as directed by your health care provider.  Do not take baths. Take showers only. Ask your health care provider when you can start taking baths.  Take your temperature twice each day and write it down.  Try to have help for your household needs for the first 7-10 days.  There are many different ways to close and cover an incision, including stitches (sutures), skin glue, and adhesive strips. Follow instructions from your health care provider about:  Incision care.  Bandage (dressing) changes and removal.  Incision closure removal.  Check your incision area every day for signs of infection. Watch for:  Redness, swelling, or pain.  Fluid, blood, or pus.  Keep all follow-up visits as directed by your health care provider. SEEK MEDICAL CARE IF:  You have redness, swelling, or increasing  pain in your incision area.  You have fluid, blood, or pus coming from your incision for longer than 1 day.  You notice a bad smell coming from your incision or your dressing.  The edges of your incision break open after the sutures have been removed.  Your pain does not decrease after 2-3 days.  You have a rash.  You repeatedly become dizzy or light-headed.  You have a reaction to your medicine.  Your pain medicine is not helping.  You are constipated. SEEK IMMEDIATE MEDICAL CARE IF:  You have a fever.  You faint.  You have increasing pain in your abdomen.  You have severe pain in one or both of your shoulders.  You have bleeding or drainage from your suture sites or your vagina after surgery.  You have shortness of breath or have difficulty breathing.  You have chest pain or leg pain.  You have ongoing nausea, vomiting, or diarrhea.   This information is not intended to replace advice given to you by your health care provider. Make sure you discuss any questions you have with your health care provider.   Document Released: 10/23/2004 Document Revised: 08/20/2014 Document Reviewed: 07/17/2011 Elsevier Interactive Patient Education [employer]

## 2016-01-30 ENCOUNTER — Encounter (HOSPITAL_COMMUNITY)
Admission: RE | Admit: 2016-01-30 | Discharge: 2016-01-30 | Disposition: A | Discharge status: Home / Self Care | Payer: Medicaid Other | Source: Ambulatory Visit | Attending: Obstetrics and Gynecology | Admitting: Obstetrics and Gynecology

## 2016-01-30 ENCOUNTER — Encounter (HOSPITAL_COMMUNITY): Payer: Self-pay

## 2016-01-30 DIAGNOSIS — Z01812 Encounter for preprocedural laboratory examination: Secondary | ICD-10-CM | POA: Insufficient documentation

## 2016-01-30 LAB — URINALYSIS, ROUTINE W REFLEX MICROSCOPIC
BILIRUBIN URINE: NEGATIVE
GLUCOSE, UA: NEGATIVE mg/dL
KETONES UR: NEGATIVE mg/dL
NITRITE: NEGATIVE
PH: 6 (ref 5.0–8.0)
Protein, ur: 30 mg/dL — AB
SPECIFIC GRAVITY, URINE: 1.025 (ref 1.005–1.030)

## 2016-01-30 LAB — URINE MICROSCOPIC-ADD ON

## 2016-01-30 LAB — CBC
HEMATOCRIT: 41.7 % (ref 36.0–46.0)
HEMOGLOBIN: 13.5 g/dL (ref 12.0–15.0)
MCH: 29 pg (ref 26.0–34.0)
MCHC: 32.4 g/dL (ref 30.0–36.0)
MCV: 89.5 fL (ref 78.0–100.0)
Platelets: 341 10*3/uL (ref 150–400)
RBC: 4.66 MIL/uL (ref 3.87–5.11)
RDW: 14.9 % (ref 11.5–15.5)
WBC: 11.8 10*3/uL — ABNORMAL HIGH (ref 4.0–10.5)

## 2016-01-30 LAB — HCG, SERUM, QUALITATIVE: Preg, Serum: NEGATIVE

## 2016-02-03 ENCOUNTER — Ambulatory Visit (HOSPITAL_COMMUNITY)
Admission: RE | Admit: 2016-02-03 | Discharge: 2016-02-03 | Disposition: A | Discharge status: Home / Self Care | Payer: Medicaid Other | Source: Ambulatory Visit | Attending: Obstetrics and Gynecology | Admitting: Obstetrics and Gynecology

## 2016-02-03 ENCOUNTER — Ambulatory Visit (HOSPITAL_COMMUNITY): Payer: Medicaid Other | Admitting: Anesthesiology

## 2016-02-03 ENCOUNTER — Encounter (HOSPITAL_COMMUNITY): Payer: Self-pay | Admitting: Anesthesiology

## 2016-02-03 ENCOUNTER — Encounter (HOSPITAL_COMMUNITY)
Admission: RE | Disposition: A | Discharge status: Home / Self Care | Payer: Self-pay | Source: Ambulatory Visit | Attending: Obstetrics and Gynecology

## 2016-02-03 DIAGNOSIS — Z79899 Other long term (current) drug therapy: Secondary | ICD-10-CM | POA: Insufficient documentation

## 2016-02-03 DIAGNOSIS — F419 Anxiety disorder, unspecified: Secondary | ICD-10-CM | POA: Diagnosis not present

## 2016-02-03 DIAGNOSIS — D649 Anemia, unspecified: Secondary | ICD-10-CM | POA: Insufficient documentation

## 2016-02-03 DIAGNOSIS — K219 Gastro-esophageal reflux disease without esophagitis: Secondary | ICD-10-CM | POA: Insufficient documentation

## 2016-02-03 DIAGNOSIS — F329 Major depressive disorder, single episode, unspecified: Secondary | ICD-10-CM | POA: Diagnosis not present

## 2016-02-03 DIAGNOSIS — Z79891 Long term (current) use of opiate analgesic: Secondary | ICD-10-CM | POA: Diagnosis not present

## 2016-02-03 DIAGNOSIS — Z793 Long term (current) use of hormonal contraceptives: Secondary | ICD-10-CM | POA: Diagnosis not present

## 2016-02-03 DIAGNOSIS — Z302 Encounter for sterilization: Secondary | ICD-10-CM | POA: Diagnosis not present

## 2016-02-03 DIAGNOSIS — K861 Other chronic pancreatitis: Secondary | ICD-10-CM | POA: Insufficient documentation

## 2016-02-03 DIAGNOSIS — F17210 Nicotine dependence, cigarettes, uncomplicated: Secondary | ICD-10-CM | POA: Insufficient documentation

## 2016-02-03 HISTORY — PX: LAPAROSCOPIC TUBAL LIGATION: SHX1937

## 2016-02-03 SURGERY — LIGATION, FALLOPIAN TUBE, LAPAROSCOPIC
Anesthesia: General | Site: Abdomen | Laterality: Bilateral

## 2016-02-03 MED ORDER — KETOROLAC TROMETHAMINE 30 MG/ML IJ SOLN
30.0000 mg | Freq: Once | INTRAMUSCULAR | Status: AC
  Administered 2016-02-03: 30 mg via INTRAVENOUS
  Filled 2016-02-03: qty 1

## 2016-02-03 MED ORDER — FENTANYL CITRATE (PF) 100 MCG/2ML IJ SOLN
INTRAMUSCULAR | Status: AC
  Filled 2016-02-03: qty 2

## 2016-02-03 MED ORDER — BUPIVACAINE HCL (PF) 0.25 % IJ SOLN
INTRAMUSCULAR | Status: AC
  Filled 2016-02-03: qty 30

## 2016-02-03 MED ORDER — BUPIVACAINE HCL (PF) 0.25 % IJ SOLN
INTRAMUSCULAR | Status: DC | PRN
  Administered 2016-02-03: 10 mL

## 2016-02-03 MED ORDER — ONDANSETRON HCL 4 MG/2ML IJ SOLN: 4.0000 mg | Freq: Four times a day (QID) | INTRAMUSCULAR | Status: DC | PRN

## 2016-02-03 MED ORDER — FENTANYL CITRATE (PF) 100 MCG/2ML IJ SOLN
INTRAMUSCULAR | Status: DC | PRN
  Administered 2016-02-03 (×5): 100 ug via INTRAVENOUS

## 2016-02-03 MED ORDER — FENTANYL CITRATE (PF) 100 MCG/2ML IJ SOLN
25.0000 ug | INTRAMUSCULAR | Status: DC | PRN
  Administered 2016-02-03: 50 ug via INTRAVENOUS
  Administered 2016-02-03: 25 ug via INTRAVENOUS

## 2016-02-03 MED ORDER — PROPOFOL 10 MG/ML IV BOLUS
INTRAVENOUS | Status: DC | PRN
  Administered 2016-02-03: 200 mg via INTRAVENOUS

## 2016-02-03 MED ORDER — MIDAZOLAM HCL 5 MG/5ML IJ SOLN
INTRAMUSCULAR | Status: DC | PRN
  Administered 2016-02-03: 2 mg via INTRAVENOUS

## 2016-02-03 MED ORDER — FENTANYL CITRATE (PF) 100 MCG/2ML IJ SOLN
INTRAMUSCULAR | Status: AC
Start: 2016-02-03 — End: 2016-02-03
  Filled 2016-02-03: qty 2

## 2016-02-03 MED ORDER — SUGAMMADEX SODIUM 200 MG/2ML IV SOLN
INTRAVENOUS | Status: DC | PRN
  Administered 2016-02-03: 200 mg via INTRAVENOUS

## 2016-02-03 MED ORDER — OXYCODONE HCL 5 MG/5ML PO SOLN
5.0000 mg | Freq: Once | ORAL | Status: AC | PRN
  Filled 2016-02-03: qty 5

## 2016-02-03 MED ORDER — LACTATED RINGERS IV SOLN
INTRAVENOUS | Status: DC
  Administered 2016-02-03: 13:00:00 via INTRAVENOUS
  Administered 2016-02-03: 1000 mL via INTRAVENOUS

## 2016-02-03 MED ORDER — ONDANSETRON HCL 4 MG/2ML IJ SOLN
INTRAMUSCULAR | Status: DC | PRN
  Administered 2016-02-03: 4 mg via INTRAVENOUS

## 2016-02-03 MED ORDER — OXYCODONE HCL 5 MG PO TABS
5.0000 mg | ORAL_TABLET | Freq: Once | ORAL | Status: AC | PRN
  Administered 2016-02-03: 5 mg via ORAL
  Filled 2016-02-03: qty 1

## 2016-02-03 MED ORDER — MIDAZOLAM HCL 2 MG/2ML IJ SOLN
INTRAMUSCULAR | Status: AC
  Filled 2016-02-03: qty 2

## 2016-02-03 MED ORDER — ROCURONIUM BROMIDE 100 MG/10ML IV SOLN
INTRAVENOUS | Status: DC | PRN
  Administered 2016-02-03: 30 mg via INTRAVENOUS

## 2016-02-03 MED ORDER — SODIUM CHLORIDE 0.9 % IR SOLN
Status: DC | PRN
Start: 2016-02-03 — End: 2016-02-03
  Administered 2016-02-03: 1000 mL

## 2016-02-03 SURGICAL SUPPLY — 42 items
BAG HAMPER (MISCELLANEOUS) ×3 IMPLANT
BANDAGE STRIP 1X3 FLEXIBLE (GAUZE/BANDAGES/DRESSINGS) ×6 IMPLANT
BLADE SURG SZ11 CARB STEEL (BLADE) ×5 IMPLANT
CATH ROBINSON RED A/P 16FR (CATHETERS) ×3 IMPLANT
CLOSURE WOUND 1/4 X3 (GAUZE/BANDAGES/DRESSINGS) ×1
CLOTH BEACON ORANGE TIMEOUT ST (SAFETY) ×3 IMPLANT
COVER LIGHT HANDLE STERIS (MISCELLANEOUS) ×6 IMPLANT
COVER MAYO STAND XLG (DRAPE) ×3 IMPLANT
DECANTER SPIKE VIAL GLASS SM (MISCELLANEOUS) ×3 IMPLANT
DURAPREP 26ML APPLICATOR (WOUND CARE) ×2 IMPLANT
ELECT REM PT RETURN 9FT ADLT (ELECTROSURGICAL) ×3
ELECTRODE REM PT RTRN 9FT ADLT (ELECTROSURGICAL) ×1 IMPLANT
GLOVE BIOGEL M 7.0 STRL (GLOVE) ×2 IMPLANT
GLOVE BIOGEL PI IND STRL 7.0 (GLOVE) ×1 IMPLANT
GLOVE BIOGEL PI IND STRL 9 (GLOVE) ×1 IMPLANT
GLOVE BIOGEL PI INDICATOR 7.0 (GLOVE) ×2
GLOVE BIOGEL PI INDICATOR 9 (GLOVE) ×2
GLOVE ECLIPSE 9.0 STRL (GLOVE) ×3 IMPLANT
GLOVE EXAM NITRILE PF MED BLUE (GLOVE) ×4 IMPLANT
GOWN SPEC L3 XXLG W/TWL (GOWN DISPOSABLE) ×4 IMPLANT
GOWN STRL REUS W/TWL LRG LVL3 (GOWN DISPOSABLE) ×3 IMPLANT
INST SET LAPROSCOPIC GYN AP (KITS) ×3 IMPLANT
KIT ROOM TURNOVER AP CYSTO (KITS) ×3 IMPLANT
MANIFOLD NEPTUNE II (INSTRUMENTS) ×3 IMPLANT
NDL INSUFFLATION 14GA 120MM (NEEDLE) IMPLANT
NEEDLE INSUFFLATION 14GA 120MM (NEEDLE) IMPLANT
NS IRRIG 1000ML POUR BTL (IV SOLUTION) ×3 IMPLANT
PACK PERI GYN (CUSTOM PROCEDURE TRAY) ×3 IMPLANT
PAD ARMBOARD 7.5X6 YLW CONV (MISCELLANEOUS) ×3 IMPLANT
RING FALLOPIAN BANDS (Ring) ×4 IMPLANT
SET BASIN LINEN APH (SET/KITS/TRAYS/PACK) ×3 IMPLANT
SOL PREP PROV IODINE SCRUB 4OZ (MISCELLANEOUS) ×3 IMPLANT
SOLUTION ANTI FOG 6CC (MISCELLANEOUS) ×3 IMPLANT
STRIP CLOSURE SKIN 1/4X3 (GAUZE/BANDAGES/DRESSINGS) ×2 IMPLANT
SUT VIC AB 4-0 PS2 27 (SUTURE) IMPLANT
SYR 30ML LL (SYRINGE) ×1 IMPLANT
SYR BULB IRRIGATION 50ML (SYRINGE) ×2 IMPLANT
SYR CONTROL 10ML LL (SYRINGE) ×2 IMPLANT
SYRINGE 10CC LL (SYRINGE) ×4 IMPLANT
TROCAR Z-THREAD FIOS 5X100MM (TROCAR) ×3 IMPLANT
TUBING INSUFFLATION (TUBING) ×3 IMPLANT
WARMER LAPAROSCOPE (MISCELLANEOUS) ×3 IMPLANT

## 2016-02-03 NOTE — Discharge Instructions (Signed)
Laparoscopic Tubal Ligation, Care After °Refer to this sheet in the next few weeks. These instructions provide you with information about caring for yourself after your procedure. Your health care provider may also give you more specific instructions. Your treatment has been planned according to current medical practices, but problems sometimes occur. Call your health care provider if you have any problems or questions after your procedure. °WHAT TO EXPECT AFTER THE PROCEDURE °After your procedure, it is common to have: °· Sore throat. °· Soreness at the incision site. °· Mild cramping. °· Tiredness. °· Mild nausea or vomiting. °· Shoulder pain. °HOME CARE INSTRUCTIONS °· Rest for the remainder of the day. °· Take medicines only as directed by your health care provider. These include over-the-counter medicines and prescription medicines. Do not take aspirin, which can cause bleeding. °· Over the next few days, gradually return to your normal activities and your normal diet. °· Avoid sexual intercourse for 2 weeks or as directed by your health care provider. °· Do not use tampons, and do not douche. °· Do not drive or operate heavy machinery while taking pain medicine. °· Do not lift anything that is heavier than 5 lb (2.3 kg) for 2 weeks or as directed by your health care provider. °· Do not take baths. Take showers only. Ask your health care provider when you can start taking baths. °· Take your temperature twice each day and write it down. °· Try to have help for your household needs for the first 7-10 days. °· There are many different ways to close and cover an incision, including stitches (sutures), skin glue, and adhesive strips. Follow instructions from your health care provider about: °¨ Incision care. °¨ Bandage (dressing) changes and removal. °¨ Incision closure removal. °· Check your incision area every day for signs of infection. Watch for: °¨ Redness, swelling, or pain. °¨ Fluid, blood, or pus. °· Keep  all follow-up visits as directed by your health care provider. °SEEK MEDICAL CARE IF: °· You have redness, swelling, or increasing pain in your incision area. °· You have fluid, blood, or pus coming from your incision for longer than 1 day. °· You notice a bad smell coming from your incision or your dressing. °· The edges of your incision break open after the sutures have been removed. °· Your pain does not decrease after 2-3 days. °· You have a rash. °· You repeatedly become dizzy or light-headed. °· You have a reaction to your medicine. °· Your pain medicine is not helping. °· You are constipated. °SEEK IMMEDIATE MEDICAL CARE IF: °· You have a fever. °· You faint. °· You have increasing pain in your abdomen. °· You have severe pain in one or both of your shoulders. °· You have bleeding or drainage from your suture sites or your vagina after surgery. °· You have shortness of breath or have difficulty breathing. °· You have chest pain or leg pain. °· You have ongoing nausea, vomiting, or diarrhea. °  °This information is not intended to replace advice given to you by your health care provider. Make sure you discuss any questions you have with your health care provider. °  °Document Released: 10/23/2004 Document Revised: 08/20/2014 Document Reviewed: 07/17/2011 °Elsevier Interactive Patient Education ©2016 Elsevier Inc. ° °PATIENT INSTRUCTIONS °POST-ANESTHESIA ° °IMMEDIATELY FOLLOWING SURGERY:  Do not drive or operate machinery for the first twenty four hours after surgery.  Do not make any important decisions for twenty four hours after surgery or while taking narcotic pain   medications or sedatives.  If you develop intractable nausea and vomiting or a severe headache please notify your doctor immediately.  FOLLOW-UP:  Please make an appointment with your surgeon as instructed. You do not need to follow up with anesthesia unless specifically instructed to do so.  WOUND CARE INSTRUCTIONS (if applicable):  Keep a  dry clean dressing on the anesthesia/puncture wound site if there is drainage.  Once the wound has quit draining you may leave it open to air.  Generally you should leave the bandage intact for twenty four hours unless there is drainage.  If the epidural site drains for more than 36-48 hours please call the anesthesia department.  QUESTIONS?:  Please feel free to call your physician or the hospital operator if you have any questions, and they will be happy to assist you.
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
## 2016-02-03 NOTE — Anesthesia Procedure Notes (Signed)
Procedure Name: Intubation Date/Time: 02/03/2016 1:02 PM Performed by: Marrion CoyMERRITT, Aleeyah Bensen R Pre-anesthesia Checklist: Patient identified, Patient being monitored, Timeout performed, Emergency Drugs available and Suction available Patient Re-evaluated:Patient Re-evaluated prior to inductionOxygen Delivery Method: Circle System Utilized Preoxygenation: Pre-oxygenation with 100% oxygen Intubation Type: IV induction Ventilation: Mask ventilation without difficulty Laryngoscope Size: Mac and 3 Grade View: Grade II Tube type: Oral Tube size: 7.0 mm Number of attempts: 1 Airway Equipment and Method: stylet Placement Confirmation: ETT inserted through vocal cords under direct vision,  positive ETCO2 and breath sounds checked- equal and bilateral Secured at: 21 cm Tube secured with: Tape Dental Injury: Teeth and Oropharynx as per pre-operative assessment

## 2016-02-03 NOTE — Anesthesia Preprocedure Evaluation (Signed)
Anesthesia Evaluation  Patient identified by MRN, date of birth, ID band Patient awake    Reviewed: Allergy & Precautions, NPO status , Patient's Chart, lab work & pertinent test results  Airway Mallampati: II   Neck ROM: full    Dental   Pulmonary Current Smoker,    breath sounds clear to auscultation       Cardiovascular hypertension,  Rhythm:regular Rate:Normal     Neuro/Psych Anxiety Depression    GI/Hepatic GERD  ,  Endo/Other    Renal/GU      Musculoskeletal   Abdominal   Peds  Hematology   Anesthesia Other Findings   Reproductive/Obstetrics                             Anesthesia Physical Anesthesia Plan  ASA: II  Anesthesia Plan: General   Post-op Pain Management:    Induction: Intravenous  Airway Management Planned: Oral ETT  Additional Equipment:   Intra-op Plan:   Post-operative Plan: Extubation in OR  Informed Consent: I have reviewed the patients History and Physical, chart, labs and discussed the procedure including the risks, benefits and alternatives for the proposed anesthesia with the patient or authorized representative who has indicated his/her understanding and acceptance.     Plan Discussed with: CRNA, Anesthesiologist and Surgeon  Anesthesia Plan Comments:         Anesthesia Quick Evaluation

## 2016-02-03 NOTE — Transfer of Care (Signed)
Immediate Anesthesia Transfer of Care Note  Patient: Elijah BirkKatie C Kober  Procedure(s) Performed: Procedure(s): LAPAROSCOPIC BILATERAL TUBAL LIGATION WITH FALLOPE RINGS (Bilateral)  Patient Location: PACU  Anesthesia Type:General  Level of Consciousness: awake, alert , oriented and patient cooperative  Airway & Oxygen Therapy: Patient Spontanous Breathing and Patient connected to face mask oxygen  Post-op Assessment: Report given to RN, Post -op Vital signs reviewed and stable and Patient moving all extremities X 4  Post vital signs: Reviewed and stable  Last Vitals:  Vitals:   02/03/16 1215  BP: (!) 132/95  Resp: (!) 27  Temp: 36.7 C    Last Pain:  Vitals:   02/03/16 1215  PainSc: 0-No pain         Complications: No apparent anesthesia complications

## 2016-02-03 NOTE — Brief Op Note (Signed)
02/03/2016  1:10 PM  PATIENT:  Amber BirkKatie C Pargas  30 y.o. female  PRE-OPERATIVE DIAGNOSIS:  desires sterilization  POST-OPERATIVE DIAGNOSIS:  desires sterilization  PROCEDURE:  Procedure(s): LAPAROSCOPIC TUBAL LIGATION (Bilateral), Falope ring application  SURGEON:  Surgeon(s) and Role:    * Tilda BurrowJohn V Climmie Cronce, MD - Primary  PHYSICIAN ASSISTANT:   ASSISTANTS: Marya LandryMaggie Henderson CST   ANESTHESIA:   general  EBL:  No intake/output data recorded.  BLOOD ADMINISTERED:none  DRAINS: none   LOCAL MEDICATIONS USED:  NONE  SPECIMEN:  No Specimen  DISPOSITION OF SPECIMEN:  N/A  COUNTS:  YES  TOURNIQUET:  * No tourniquets in log *  DICTATION: .Dragon Dictation  PLAN OF CARE: Discharge to home after PACU  PATIENT DISPOSITION:  PACU - hemodynamically stable.   Delay start of Pharmacological VTE agent (>24hrs) due to surgical blood loss or risk of bleeding: not applicable Details of procedure: Patient was taken operating room after reaffirming her desire for permanent sterilization in the preoperative process. Upon general anesthesia introduced and the abdomen was prepped and draped with legs in low lithotomy support, with catheterization the bladder, Hulka tenaculum attached to the uterus for manipulation of the uterus, and abdomen prepped and draped. Local vertical 1 cm skin incision was made as well as suprapubic incision of similar length and Veress needle was introduced through the umbilicus with water droplet technique confirmed intraperitoneal positioning and pneumoperitoneum easily achieved under 5 mmHg pressure, with introduction of 5 mm trocar, which allowed introduction of the 5 mm camera which showed normal pelvic structures no evidence of trauma associated with laparoscopic entry or insufflation, and then the suprapubic trocar was placed under direct visualization. Attention then directed to the fallopian tubes which were identified to their fimbriated end. No adhesions in the  pelvis. The left and tube could be identified elevated and Falope ring applied to its midportion. The percutaneous final needle was then used to infiltrate the fallopian tube Marcaine solution for postoperative pain management. Right fallopian tube was treated similarly place small knuckle of tube incarcerated in the Falope ring and infiltrated with Marcaine. The broad ligament beneath it was also infiltrated. Sponge and needle counts were correct patient had laparoscopic equipment removed, subcuticular 4-0 Vicryl closure the skin incisions after placement of 120 cc of saline in the abdominal cavity to assist with evacuation of the carbon dioxide. Sponge and needle counts were correct. Instruments were removed and patient allowed to go recovery room in stable condition

## 2016-02-03 NOTE — Op Note (Signed)
Please see the brief operative note for surgical details Falope ring application which was uneventful

## 2016-02-03 NOTE — Anesthesia Postprocedure Evaluation (Signed)
Anesthesia Post Note  Patient: Elijah BirkKatie C Najarro  Procedure(s) Performed: Procedure(s) (LRB): LAPAROSCOPIC BILATERAL TUBAL LIGATION WITH FALLOPE RINGS (Bilateral)  Patient location during evaluation: PACU Anesthesia Type: General Level of consciousness: awake and alert and oriented Pain management: pain level controlled (Patient tearful but pain controlled with Fentanyl) Vital Signs Assessment: post-procedure vital signs reviewed and stable Respiratory status: respiratory function stable and spontaneous breathing Cardiovascular status: stable Postop Assessment: no signs of nausea or vomiting Anesthetic complications: no    Last Vitals:  Vitals:   02/03/16 1345 02/03/16 1415  BP: (!) 144/112 (!) 157/103  Pulse:  (!) 57  Resp: 16 10  Temp: 36.6 C     Last Pain:  Vitals:   02/03/16 1415  PainSc: 9                  Thomasena Vandenheuvel A

## 2016-02-04 ENCOUNTER — Encounter (HOSPITAL_COMMUNITY): Payer: Self-pay | Admitting: *Deleted

## 2016-02-04 ENCOUNTER — Emergency Department (HOSPITAL_COMMUNITY)
Admission: EM | Admit: 2016-02-04 | Discharge: 2016-02-04 | Disposition: A | Discharge status: Home / Self Care | Payer: Medicaid Other | Attending: Emergency Medicine | Admitting: Emergency Medicine

## 2016-02-04 ENCOUNTER — Other Ambulatory Visit: Payer: Self-pay | Admitting: Obstetrics and Gynecology

## 2016-02-04 DIAGNOSIS — G8918 Other acute postprocedural pain: Secondary | ICD-10-CM | POA: Insufficient documentation

## 2016-02-04 DIAGNOSIS — F17210 Nicotine dependence, cigarettes, uncomplicated: Secondary | ICD-10-CM | POA: Diagnosis not present

## 2016-02-04 DIAGNOSIS — R1030 Lower abdominal pain, unspecified: Secondary | ICD-10-CM | POA: Insufficient documentation

## 2016-02-04 DIAGNOSIS — Z79899 Other long term (current) drug therapy: Secondary | ICD-10-CM | POA: Insufficient documentation

## 2016-02-04 DIAGNOSIS — N939 Abnormal uterine and vaginal bleeding, unspecified: Secondary | ICD-10-CM | POA: Diagnosis not present

## 2016-02-04 MED ORDER — HYDROMORPHONE HCL 1 MG/ML IJ SOLN
1.0000 mg | Freq: Once | INTRAMUSCULAR | Status: AC
  Administered 2016-02-04: 1 mg via INTRAMUSCULAR
  Filled 2016-02-04: qty 1

## 2016-02-04 MED ORDER — ONDANSETRON HCL 4 MG/2ML IJ SOLN
4.0000 mg | Freq: Once | INTRAMUSCULAR | Status: DC
Start: 2016-02-04 — End: 2016-02-04

## 2016-02-04 MED ORDER — ONDANSETRON 4 MG PO TBDP
4.0000 mg | ORAL_TABLET | Freq: Once | ORAL | Status: AC
  Administered 2016-02-04: 4 mg via ORAL
  Filled 2016-02-04: qty 1

## 2016-02-04 MED ORDER — HYDROMORPHONE HCL 1 MG/ML IJ SOLN: 1.0000 mg | Freq: Once | INTRAMUSCULAR | Status: DC

## 2016-02-04 MED ORDER — ONDANSETRON 8 MG PO TBDP
8.0000 mg | ORAL_TABLET | Freq: Once | ORAL | Status: AC
  Administered 2016-02-04: 8 mg via ORAL
  Filled 2016-02-04: qty 1

## 2016-02-04 MED ORDER — SODIUM CHLORIDE 0.9 % IV SOLN: 1000.0000 mL | Freq: Once | INTRAVENOUS | Status: DC
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
## 2016-02-04 NOTE — ED Triage Notes (Signed)
Pt c/o vaginal bleeding and uncontrolled abd pain with home medications that started after having tubal ligation yesterday,

## 2016-02-04 NOTE — Discharge Instructions (Signed)
I discussed your case with Dr. Emelda FearFerguson. He will see you tomorrow morning at 8:30. Make sure you make the appointment.

## 2016-02-05 ENCOUNTER — Telehealth: Payer: Self-pay | Admitting: Obstetrics and Gynecology

## 2016-02-05 ENCOUNTER — Encounter: Payer: Self-pay | Admitting: Obstetrics and Gynecology

## 2016-02-05 ENCOUNTER — Ambulatory Visit (INDEPENDENT_AMBULATORY_CARE_PROVIDER_SITE_OTHER): Payer: Medicaid Other | Admitting: Obstetrics and Gynecology

## 2016-02-05 DIAGNOSIS — N946 Dysmenorrhea, unspecified: Secondary | ICD-10-CM | POA: Insufficient documentation

## 2016-02-05 MED ORDER — OXYCODONE HCL 10 MG PO TABS: 10.0000 mg | ORAL_TABLET | Freq: Four times a day (QID) | ORAL | 0 refills | Status: DC

## 2016-02-05 NOTE — Progress Notes (Addendum)
   Subjective:  [name] is a 30 y.o. female now 2 days status post BTL with falope rings. Pt reports that she is in contact with a Dr. Tollie [place]Plummer in [place] to be included in a pain management clinic for her chronic abdominal pain. Pt denies any other symptoms.   Review of Systems Negative except    Diet:     Bowel movements : no bowel movement yet.  Pain is controlled with current analgesics. Medications being used: acetaminophen and narcotic analgesics including oxycodone (Oxycontin, Oxyir).  Objective:  BP (!) 140/100   Pulse 64   Ht 5\' 4"  (1.626 m)   Wt 138 lb 12.8 oz (63 kg)   LMP 01/25/2016   BMI 23.82 kg/m  General:Well developed, well nourished.  No acute distress. Abdomen: Bowel sounds normal, soft, non-tender. Pelvic Exam:    External Genitalia:  Normal.    Vagina: Normal, normal appearing non-purulent menstrual blood.     Cervix: Normal, single 2 mm on anterior upper cervix at tenaculum site   Uterus: Normal    Adnexa/Bimanual:   Incision(s):   Healing well, no drainage, no erythema, no hernia, no swelling, no dehiscence,    Assessment:  Post-Op 2 days s/p BTL with falope rings  Opiate addiction dysmenorrhea Doing well postoperatively.   Plan:  1.Wound care discussed   2. Refill 5 day Rx of oxycodone.as patient has gotten ahead on her meds during postop time frame. Pt is aware that I will NOT be writing any more opiates, and she has allegedly been in contact with pain clinic run by Dr Tollie EthPlummer, who will be seeing pt for further opiate management , or consideration of suboxone/ subutex. 3. Activity restrictions: desk job only 4. return to work: not applicable. 5. Follow up in 7 days.final postop visit  By signing my name below, I, [name], attest that this documentation has been prepared under the direction and in the presence of Amber BurrowJohn V Kathyleen Radice, MD. Electronically Signed: Soijett Dunn, ED Scribe. 02/05/16. 8:57 AM.  I personally performed the  services described in this documentation, which was SCRIBED in my presence. The recorded information has been reviewed and considered accurate. It has been edited as necessary during review. Amber BurrowFERGUSON,Lakendra Helling V, MD

## 2016-02-06 NOTE — Telephone Encounter (Signed)
Please send the last 2 notes on MS Murch , ones that explain her opiate addiction, to the [facility] at fax [phone] and then notify pt today that the referral has been completed.

## 2016-02-09 ENCOUNTER — Encounter (HOSPITAL_COMMUNITY): Payer: Self-pay | Admitting: Obstetrics and Gynecology

## 2016-02-12 ENCOUNTER — Ambulatory Visit (INDEPENDENT_AMBULATORY_CARE_PROVIDER_SITE_OTHER): Payer: Medicaid Other | Admitting: Obstetrics and Gynecology

## 2016-02-12 ENCOUNTER — Encounter: Payer: Self-pay | Admitting: Obstetrics and Gynecology

## 2016-02-12 VITALS — BP 130/80 | HR 98 | Wt 136.0 lb

## 2016-02-12 DIAGNOSIS — K861 Other chronic pancreatitis: Secondary | ICD-10-CM

## 2016-02-12 DIAGNOSIS — Z9889 Other specified postprocedural states: Secondary | ICD-10-CM

## 2016-02-12 MED ORDER — OXYCODONE HCL 10 MG PO TABS: 10.0000 mg | ORAL_TABLET | Freq: Four times a day (QID) | ORAL | 0 refills | Status: DC

## 2016-02-12 NOTE — Progress Notes (Addendum)
   Subjective:  [name] is a 30 y.o. female now 9 days status post   laparoscopic bilateral tubal ligation with Falope ring application.she is doing well and normal back bowel activity. She is made efforts to contact the pain clinic in [place]. Made  I was under the understanding she would be in the pain clinic management banana-shaped. She reports that she anticipatehearing from them dat reviewew of Systems Negative    Diet:   normal   Bowel movements : normal.  Pain is controlled with current analgesics. Medications being used: narcotic analgesics.   Objective:  BP 130/80 (BP Location: Right Arm, Patient Position: Sitting, Cuff Size: Normal)   Pulse 98   Wt 136 lb (61.7 kg)   LMP 02/03/2016   BMI 23.34 kg/m  General:Well developed, well nourished.  No acute distress. Abdomen: Bowel sounds normal, soft, non-tender. Pelvic Exam:not indicated  Incision(s):   Healing well, no drainage, no erythema, no hernia, no swelling, no dehiscence,     Assessment:  Post-Op 9 days s/p laparoscopic bilateral tubal ligation with Falope ring application. Chronic pancreatitis with resultant opiate dependenceNeed to Be continued on opiates for 1 more week made absolutLee clear to the patient that she should be in the care of the pain management clinic by then. The patient  Speaks as if she is oblivious to the pain withdrawal symptoms that come with opiate withdrawal. She was actually willing toleave here without any opiates. Anticipated showing up to the emergency room if sh she needed additional pain meds. She's either medically incredibly nave or committed to her ongoing opiate acquisition strategy Doing well postoperatively.   Plan:  1.Wound care discussed   2. . current medications: oxycodoneRefill 1 week to outpatient time for orderly transfer to the other clinic 3. Activity restrictions: none 4. return to work: now. 5. Follow up in PRN .no further opiates this clinic  By signing  my name below, I, Sonum Patel, attest that this documentation has been prepared under the direction and in the presence of Tilda BurrowJohn V Brittnie Lewey, MD. Electronically Signed: Sonum Patel, Neurosurgeoncribe. 02/12/16. 2:17 PM.  I personally performed the services described in this documentation, which was SCRIBED in my presence. The recorded information has been reviewed and considered accurate. It has been edited as necessary during review. Tilda BurrowFERGUSON,[name], MD

## 2016-02-16 ENCOUNTER — Encounter: Payer: Medicaid Other | Admitting: Obstetrics and Gynecology

## 2016-02-23 ENCOUNTER — Telehealth: Payer: Self-pay | Admitting: Obstetrics and Gynecology

## 2016-02-23 ENCOUNTER — Telehealth (HOSPITAL_COMMUNITY): Payer: Self-pay | Admitting: *Deleted

## 2016-02-23 ENCOUNTER — Ambulatory Visit (HOSPITAL_COMMUNITY): Payer: Self-pay | Admitting: Psychiatry

## 2016-02-23 NOTE — Telephone Encounter (Signed)
Called pt to resch appt for Nov 6th due to provider not being in office due to being sick. lmtcb on both number of file and office number provided.

## 2016-02-24 NOTE — Telephone Encounter (Signed)
Pt states was not able to get an appt for the pain clinic until December 6,2017. What to know what she can do about getting her pain medication filled until that appt. Please advise.

## 2016-02-27 ENCOUNTER — Encounter (HOSPITAL_COMMUNITY)
Admission: RE | Admit: 2016-02-27 | Discharge: 2016-02-27 | Disposition: A | Discharge status: Home / Self Care | Payer: Medicaid Other | Source: Ambulatory Visit | Attending: Obstetrics and Gynecology | Admitting: Obstetrics and Gynecology

## 2016-03-03 ENCOUNTER — Encounter (HOSPITAL_COMMUNITY): Payer: Self-pay

## 2016-03-03 ENCOUNTER — Emergency Department (HOSPITAL_COMMUNITY)
Admission: EM | Admit: 2016-03-03 | Discharge: 2016-03-03 | Disposition: A | Discharge status: Home / Self Care | Payer: Medicaid Other | Attending: Emergency Medicine | Admitting: Emergency Medicine

## 2016-03-03 DIAGNOSIS — Z79899 Other long term (current) drug therapy: Secondary | ICD-10-CM | POA: Insufficient documentation

## 2016-03-03 DIAGNOSIS — K859 Acute pancreatitis without necrosis or infection, unspecified: Secondary | ICD-10-CM | POA: Diagnosis not present

## 2016-03-03 DIAGNOSIS — F17210 Nicotine dependence, cigarettes, uncomplicated: Secondary | ICD-10-CM | POA: Diagnosis not present

## 2016-03-03 DIAGNOSIS — I10 Essential (primary) hypertension: Secondary | ICD-10-CM | POA: Diagnosis not present

## 2016-03-03 DIAGNOSIS — R1013 Epigastric pain: Secondary | ICD-10-CM | POA: Diagnosis present

## 2016-03-03 LAB — COMPREHENSIVE METABOLIC PANEL
ALBUMIN: 4.8 g/dL (ref 3.5–5.0)
ALK PHOS: 92 U/L (ref 38–126)
ALT: 42 U/L (ref 14–54)
AST: 26 U/L (ref 15–41)
Anion gap: 8 (ref 5–15)
BUN: 8 mg/dL (ref 6–20)
CALCIUM: 9.8 mg/dL (ref 8.9–10.3)
CO2: 25 mmol/L (ref 22–32)
CREATININE: 0.72 mg/dL (ref 0.44–1.00)
Chloride: 100 mmol/L — ABNORMAL LOW (ref 101–111)
GFR calc Af Amer: >60 mL/min (ref >60)
GFR calc non Af Amer: >60 mL/min (ref >60)
GLUCOSE: 120 mg/dL — AB (ref 65–99)
Potassium: 4.5 mmol/L (ref 3.5–5.1)
SODIUM: 133 mmol/L — AB (ref 135–145)
Total Bilirubin: 0.4 mg/dL (ref 0.3–1.2)
Total Protein: 7.9 g/dL (ref 6.5–8.1)

## 2016-03-03 LAB — CBC WITH DIFFERENTIAL/PLATELET
BASOS ABS: 0 10*3/uL (ref 0.0–0.1)
Basophils Relative: 0 %
EOS ABS: 0.9 10*3/uL — AB (ref 0.0–0.7)
Eosinophils Relative: 9 %
HCT: 45.5 % (ref 36.0–46.0)
HEMOGLOBIN: 15.3 g/dL — AB (ref 12.0–15.0)
LYMPHS ABS: 2.9 10*3/uL (ref 0.7–4.0)
Lymphocytes Relative: 30 %
MCH: 29.7 pg (ref 26.0–34.0)
MCHC: 33.6 g/dL (ref 30.0–36.0)
MCV: 88.3 fL (ref 78.0–100.0)
Monocytes Absolute: 1 10*3/uL (ref 0.1–1.0)
Monocytes Relative: 10 %
NEUTROS PCT: 51 %
Neutro Abs: 5 10*3/uL (ref 1.7–7.7)
Platelets: 384 10*3/uL (ref 150–400)
RBC: 5.15 MIL/uL — AB (ref 3.87–5.11)
RDW: 14.2 % (ref 11.5–15.5)
WBC: 9.7 10*3/uL (ref 4.0–10.5)

## 2016-03-03 LAB — URINALYSIS, ROUTINE W REFLEX MICROSCOPIC
BILIRUBIN URINE: NEGATIVE
Glucose, UA: NEGATIVE mg/dL
HGB URINE DIPSTICK: NEGATIVE
Ketones, ur: NEGATIVE mg/dL
Leukocytes, UA: NEGATIVE
Nitrite: NEGATIVE
PH: 5.5 (ref 5.0–8.0)
Protein, ur: NEGATIVE mg/dL
SPECIFIC GRAVITY, URINE: 1.02 (ref 1.005–1.030)

## 2016-03-03 LAB — PREGNANCY, URINE: Preg Test, Ur: NEGATIVE

## 2016-03-03 LAB — LIPASE, BLOOD: Lipase: 169 U/L — ABNORMAL HIGH (ref 11–51)

## 2016-03-03 MED ORDER — DIPHENHYDRAMINE HCL 50 MG/ML IJ SOLN
25.0000 mg | Freq: Once | INTRAMUSCULAR | Status: AC
  Administered 2016-03-03: 25 mg via INTRAVENOUS
  Filled 2016-03-03: qty 1

## 2016-03-03 MED ORDER — SODIUM CHLORIDE 0.9 % IV BOLUS (SEPSIS)
1000.0000 mL | Freq: Once | INTRAVENOUS | Status: AC
  Administered 2016-03-03: 1000 mL via INTRAVENOUS

## 2016-03-03 MED ORDER — HYDROMORPHONE HCL 1 MG/ML IJ SOLN
1.0000 mg | Freq: Once | INTRAMUSCULAR | Status: AC
  Administered 2016-03-03: 1 mg via INTRAVENOUS
  Filled 2016-03-03: qty 1

## 2016-03-03 MED ORDER — ONDANSETRON 4 MG PO TBDP: 4.0000 mg | ORAL_TABLET | Freq: Three times a day (TID) | ORAL | 2 refills | Status: DC | PRN

## 2016-03-03 MED ORDER — FENTANYL CITRATE (PF) 100 MCG/2ML IJ SOLN
50.0000 ug | Freq: Once | INTRAMUSCULAR | Status: DC
  Filled 2016-03-03: qty 2

## 2016-03-03 MED ORDER — PROMETHAZINE HCL 25 MG PO TABS: 25.0000 mg | ORAL_TABLET | Freq: Four times a day (QID) | ORAL | 2 refills | Status: DC | PRN

## 2016-03-03 MED ORDER — SODIUM CHLORIDE 0.9 % IV SOLN
INTRAVENOUS | Status: DC
  Administered 2016-03-03: 18:00:00 via INTRAVENOUS

## 2016-03-03 MED ORDER — SODIUM CHLORIDE 0.9 % IV BOLUS (SEPSIS): 1000.0000 mL | Freq: Once | INTRAVENOUS | Status: DC

## 2016-03-03 MED ORDER — PROMETHAZINE HCL 25 MG/ML IJ SOLN
12.5000 mg | Freq: Once | INTRAMUSCULAR | Status: AC
  Administered 2016-03-03: 12.5 mg via INTRAVENOUS
  Filled 2016-03-03: qty 1
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
## 2016-03-03 NOTE — Discharge Instructions (Signed)
Take the Phenergan and Zofran as needed for the abdominal pain and for the nausea and vomiting. Return for persistent vomiting. Return for fevers.

## 2016-03-03 NOTE — ED Triage Notes (Signed)
Pt reports pain in upper abd radiating through towards back.  Pt says is from pancreatitis.  Reports vomited x 4.

## 2016-03-03 NOTE — ED Notes (Signed)
Pt reports that fentanyl causes adverse reaction and causes chest heaviness. Dr Deretha Emoryzackowski notifed

## 2016-03-29 ENCOUNTER — Ambulatory Visit (HOSPITAL_COMMUNITY)
Admission: RE | Admit: 2016-03-29 | Discharge: 2016-03-29 | Disposition: A | Discharge status: Home / Self Care | Payer: Medicaid Other | Source: Ambulatory Visit | Attending: Obstetrics and Gynecology | Admitting: Obstetrics and Gynecology

## 2016-03-30 ENCOUNTER — Encounter (HOSPITAL_COMMUNITY): Payer: Self-pay | Admitting: Psychiatry

## 2016-03-30 ENCOUNTER — Ambulatory Visit (INDEPENDENT_AMBULATORY_CARE_PROVIDER_SITE_OTHER): Payer: Medicaid Other | Admitting: Psychiatry

## 2016-03-30 VITALS — BP 109/98 | HR 117 | Ht 64.0 in | Wt 133.6 lb

## 2016-03-30 DIAGNOSIS — Z888 Allergy status to other drugs, medicaments and biological substances status: Secondary | ICD-10-CM

## 2016-03-30 DIAGNOSIS — Z91030 Bee allergy status: Secondary | ICD-10-CM | POA: Diagnosis not present

## 2016-03-30 DIAGNOSIS — Z79891 Long term (current) use of opiate analgesic: Secondary | ICD-10-CM

## 2016-03-30 DIAGNOSIS — Z8249 Family history of ischemic heart disease and other diseases of the circulatory system: Secondary | ICD-10-CM

## 2016-03-30 DIAGNOSIS — Z825 Family history of asthma and other chronic lower respiratory diseases: Secondary | ICD-10-CM | POA: Diagnosis not present

## 2016-03-30 DIAGNOSIS — Z79899 Other long term (current) drug therapy: Secondary | ICD-10-CM

## 2016-03-30 DIAGNOSIS — F322 Major depressive disorder, single episode, severe without psychotic features: Secondary | ICD-10-CM | POA: Diagnosis not present

## 2016-03-30 DIAGNOSIS — Z833 Family history of diabetes mellitus: Secondary | ICD-10-CM

## 2016-03-30 DIAGNOSIS — F17210 Nicotine dependence, cigarettes, uncomplicated: Secondary | ICD-10-CM | POA: Diagnosis not present

## 2016-03-30 MED ORDER — ALPRAZOLAM 0.5 MG PO TABS: 0.5000 mg | ORAL_TABLET | Freq: Three times a day (TID) | ORAL | 2 refills | Status: AC

## 2016-03-30 MED ORDER — ZOLPIDEM TARTRATE 5 MG PO TABS: 5.0000 mg | ORAL_TABLET | Freq: Every evening | ORAL | 2 refills | Status: DC | PRN

## 2016-03-30 MED ORDER — CITALOPRAM HYDROBROMIDE 20 MG PO TABS: 20.0000 mg | ORAL_TABLET | Freq: Every day | ORAL | 2 refills | Status: DC
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
## 2016-04-26 ENCOUNTER — Other Ambulatory Visit: Payer: Self-pay | Admitting: Obstetrics and Gynecology

## 2016-04-27 ENCOUNTER — Ambulatory Visit (HOSPITAL_COMMUNITY)
Admission: RE | Admit: 2016-04-27 | Discharge: 2016-04-27 | Disposition: A | Discharge status: Home / Self Care | Payer: Medicaid Other | Source: Ambulatory Visit | Attending: Obstetrics and Gynecology | Admitting: Obstetrics and Gynecology

## 2016-04-27 ENCOUNTER — Ambulatory Visit (HOSPITAL_COMMUNITY): Payer: Self-pay | Admitting: Psychiatry

## 2016-04-27 NOTE — Telephone Encounter (Signed)
Pt give n refil of zofran.

## 2016-05-31 ENCOUNTER — Other Ambulatory Visit: Payer: Self-pay | Admitting: Obstetrics and Gynecology

## 2016-06-02 NOTE — Telephone Encounter (Signed)
refil zofran x 20 , needs appt

## 2016-11-21 IMAGING — CT CT ABD-PELV W/ CM
2 of 5 series · 15 of 46 positions shown, 17 images · IV contrast (APPLIED)
Comparison: Abdominal ultrasound performed 11/12/2015

CLINICAL DATA: Acute onset of episodic vomiting and epigastric
abdominal pain. Initial encounter.

EXAM:
CT ABDOMEN AND PELVIS WITH CONTRAST
TECHNIQUE: Multidetector CT imaging of the abdomen and pelvis was performed
using the standard protocol following bolus administration of
intravenous contrast.
CONTRAST:  100mL [id number]-BII IOPAMIDOL (OS1R1J-BII) INJECTION 61%

[Series 2: axial st · axial · 0.67mm/px · z∈[-192,+188]mm · 12 of 84 slices shown, 14 images]
[im 4/84  soft-tissue]
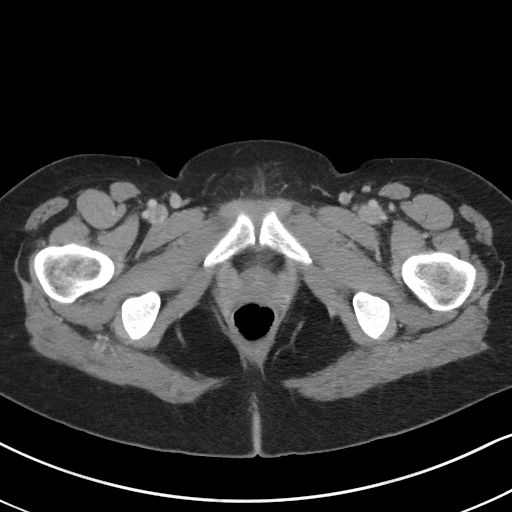
[im 4/84  bone]
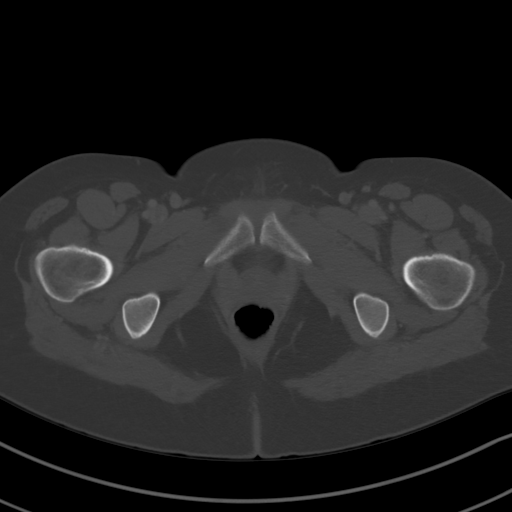
[im 11/84  soft-tissue]
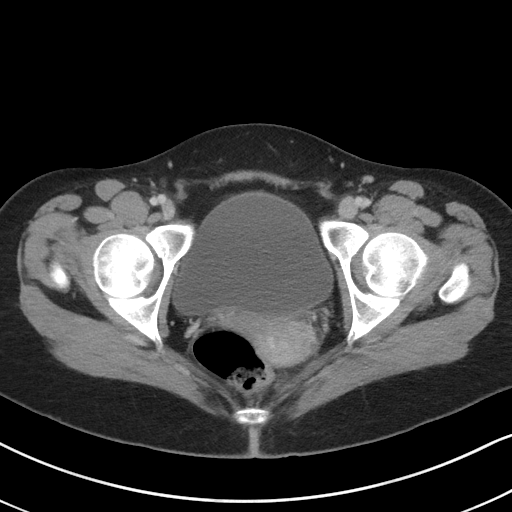
[im 19/84  soft-tissue]
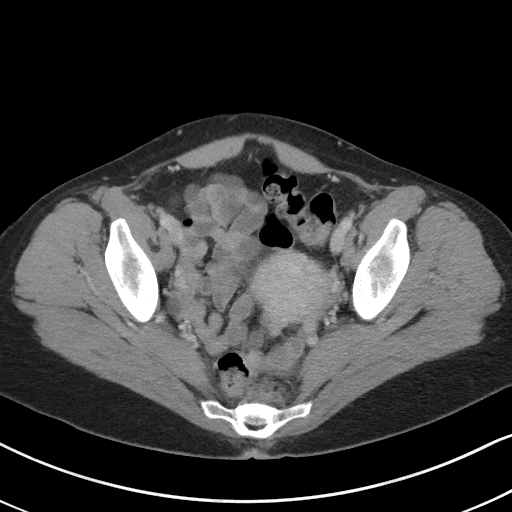
[im 26/84  soft-tissue]
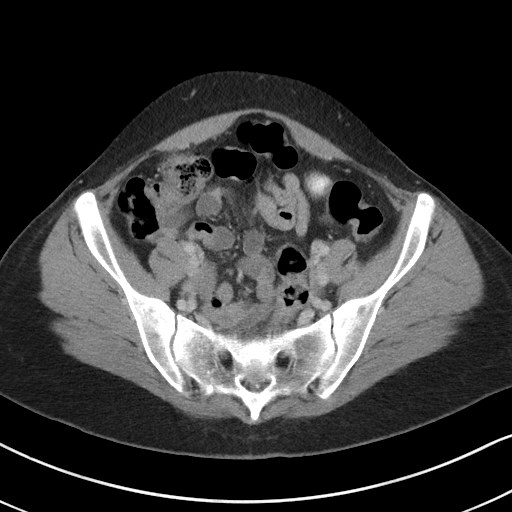
[im 33/84  soft-tissue]
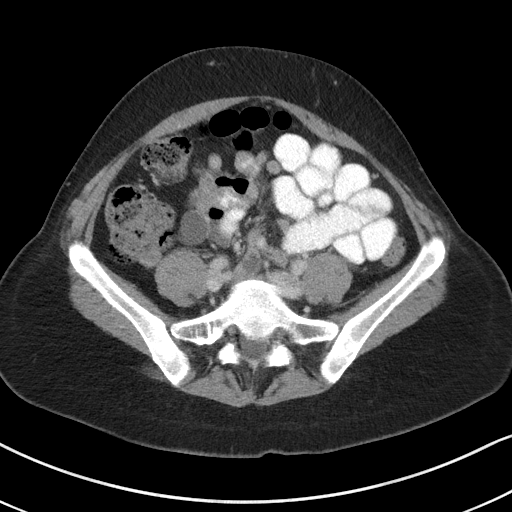
[im 40/84  soft-tissue]
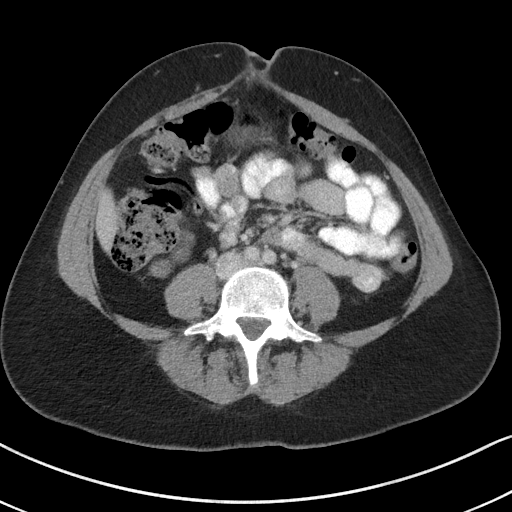
[im 44/84  soft-tissue]
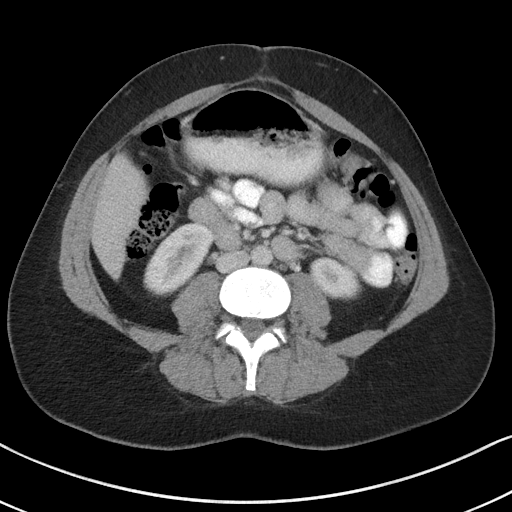
[im 51/84  soft-tissue]
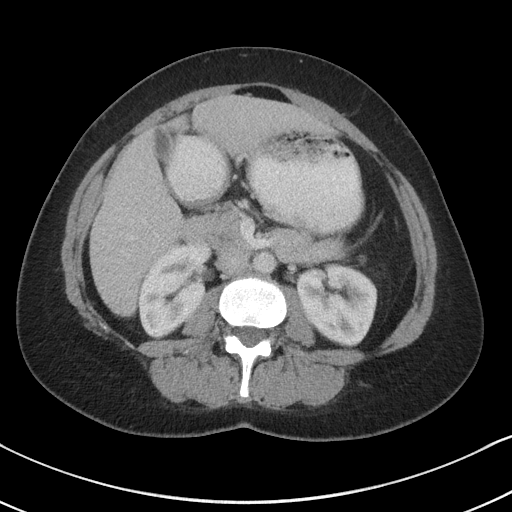
[im 58/84  soft-tissue]
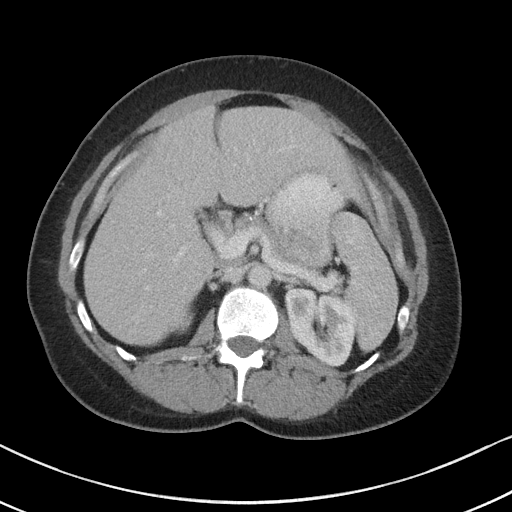
[im 58/84  bone]
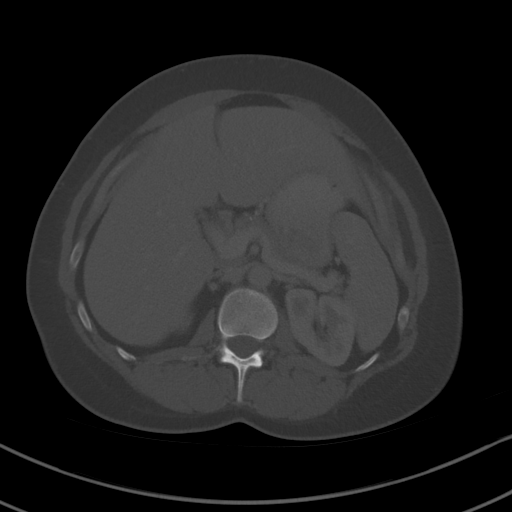
[im 65/84  soft-tissue]
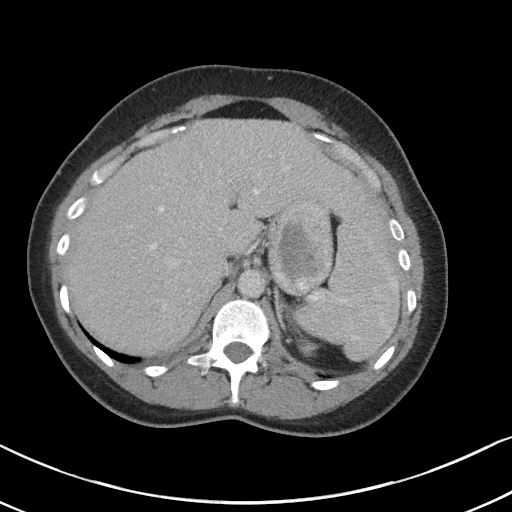
[im 73/84  soft-tissue]
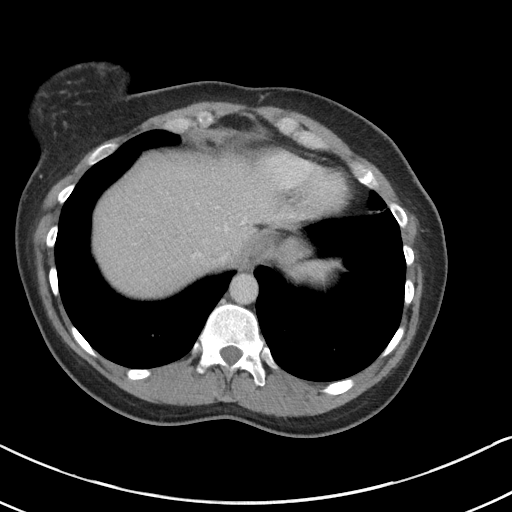
[im 80/84  soft-tissue]
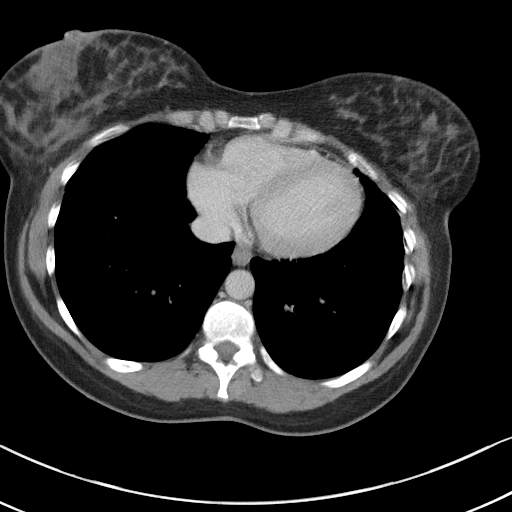

[Series 5: coronal st · coronal · 0.68mm/px · 3 of 101 slices shown]
[im 34/101  soft-tissue]
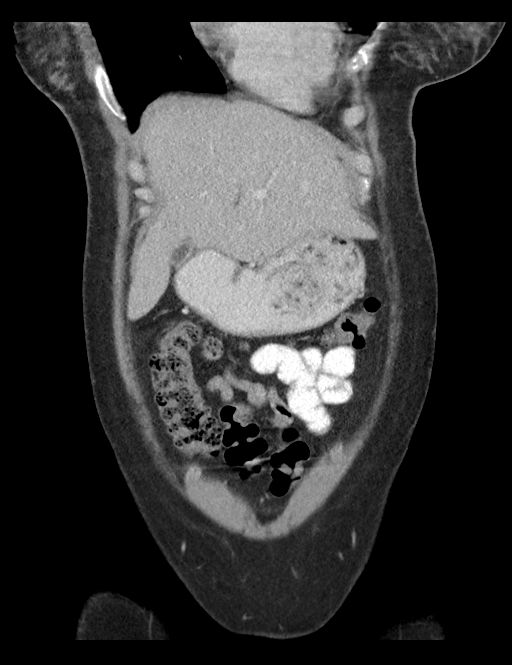
[im 45/101  soft-tissue]
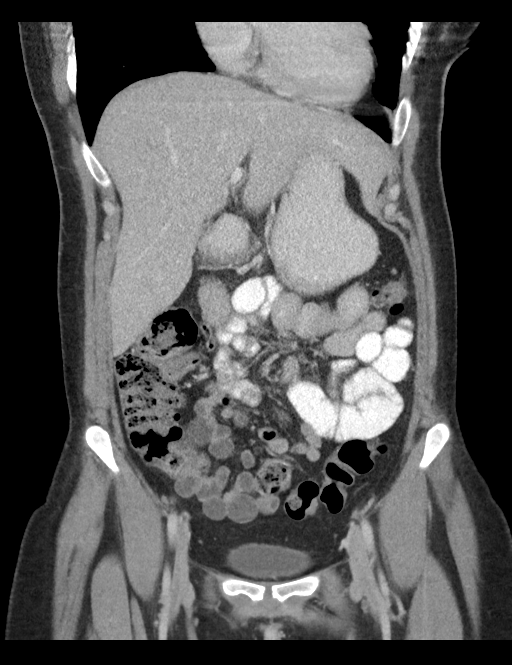
[im 56/101  soft-tissue]
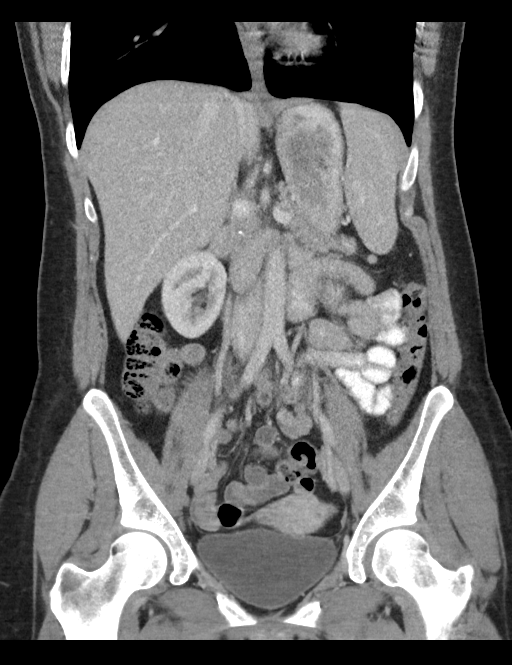

[15 of 46 positions shown; findings below may reference images not displayed]

FINDINGS: Lower chest: Minimal left basilar atelectasis is noted. The
visualized portions of the mediastinum are unremarkable.

Hepatobiliary: The liver is unremarkable in appearance. The
gallbladder is largely decompressed and unremarkable in appearance.
The common bile duct remains normal in caliber.

Pancreas: Minimal foci of calcification are noted at the pancreatic
head and body, likely reflecting sequelae of chronic pancreatitis.
No significant focal soft tissue inflammation is seen to suggest
acute pancreatitis.

Spleen: The spleen is unremarkable in appearance.

Adrenals/Urinary Tract: The adrenal glands are unremarkable in
appearance. The kidneys are within normal limits. There is no
evidence of hydronephrosis. No renal or ureteral stones are
identified. No perinephric stranding is seen.

Stomach/Bowel: The stomach is unremarkable in appearance. The small
bowel is within normal limits. The appendix is normal in caliber,
seen near the inferior tip of the liver, without evidence of
appendicitis. The colon is unremarkable in appearance.

Vascular/Lymphatic: The abdominal aorta is unremarkable in
appearance. The inferior vena cava is grossly unremarkable. No
retroperitoneal lymphadenopathy is seen. No pelvic sidewall
lymphadenopathy is identified.

Reproductive: The bladder is moderately distended and within normal
limits. The uterus is grossly unremarkable in appearance. The
ovaries are relatively symmetric. No suspicious adnexal masses are
seen.

Other: No additional soft tissue abnormalities are seen.

Musculoskeletal: No acute osseous abnormalities are identified. The
visualized musculature is unremarkable in appearance.
IMPRESSION: 1. No definite acute abnormality seen to explain the patient's
symptoms.
2. Minimal foci of calcification at the pancreatic head and body,
likely reflecting sequelae of chronic pancreatitis. No significant
acute focal soft tissue inflammation seen to suggest acute
pancreatitis at this time.

## 2017-03-20 ENCOUNTER — Emergency Department (HOSPITAL_COMMUNITY)
Admission: EM | Admit: 2017-03-20 | Discharge: 2017-03-20 | Disposition: A | Discharge status: Home / Self Care | Payer: Medicaid Other | Attending: Emergency Medicine | Admitting: Emergency Medicine

## 2017-03-20 ENCOUNTER — Encounter (HOSPITAL_COMMUNITY): Payer: Self-pay | Admitting: Emergency Medicine

## 2017-03-20 ENCOUNTER — Other Ambulatory Visit: Payer: Self-pay

## 2017-03-20 DIAGNOSIS — K2900 Acute gastritis without bleeding: Secondary | ICD-10-CM | POA: Diagnosis not present

## 2017-03-20 DIAGNOSIS — R112 Nausea with vomiting, unspecified: Secondary | ICD-10-CM

## 2017-03-20 DIAGNOSIS — Z79899 Other long term (current) drug therapy: Secondary | ICD-10-CM | POA: Insufficient documentation

## 2017-03-20 DIAGNOSIS — R1013 Epigastric pain: Secondary | ICD-10-CM

## 2017-03-20 DIAGNOSIS — F17210 Nicotine dependence, cigarettes, uncomplicated: Secondary | ICD-10-CM | POA: Diagnosis not present

## 2017-03-20 LAB — CBC
HEMATOCRIT: 44.9 % (ref 36.0–46.0)
Hemoglobin: 14.6 g/dL (ref 12.0–15.0)
MCH: 30.2 pg (ref 26.0–34.0)
MCHC: 32.5 g/dL (ref 30.0–36.0)
MCV: 93 fL (ref 78.0–100.0)
PLATELETS: 397 10*3/uL (ref 150–400)
RBC: 4.83 MIL/uL (ref 3.87–5.11)
RDW: 13.8 % (ref 11.5–15.5)
WBC: 9.3 10*3/uL (ref 4.0–10.5)

## 2017-03-20 LAB — COMPREHENSIVE METABOLIC PANEL
ALT: 15 U/L (ref 14–54)
AST: 18 U/L (ref 15–41)
Albumin: 5.1 g/dL — ABNORMAL HIGH (ref 3.5–5.0)
Alkaline Phosphatase: 98 U/L (ref 38–126)
Anion gap: 10 (ref 5–15)
BILIRUBIN TOTAL: 0.7 mg/dL (ref 0.3–1.2)
BUN: 9 mg/dL (ref 6–20)
CHLORIDE: 104 mmol/L (ref 101–111)
CO2: 25 mmol/L (ref 22–32)
CREATININE: 0.65 mg/dL (ref 0.44–1.00)
Calcium: 10.1 mg/dL (ref 8.9–10.3)
Glucose, Bld: 118 mg/dL — ABNORMAL HIGH (ref 65–99)
Potassium: 4 mmol/L (ref 3.5–5.1)
Sodium: 139 mmol/L (ref 135–145)
TOTAL PROTEIN: 8.6 g/dL — AB (ref 6.5–8.1)

## 2017-03-20 LAB — LIPASE, BLOOD: LIPASE: 40 U/L (ref 11–51)

## 2017-03-20 MED ORDER — FAMOTIDINE IN NACL 20-0.9 MG/50ML-% IV SOLN
20.0000 mg | Freq: Once | INTRAVENOUS | Status: AC
  Administered 2017-03-20: 20 mg via INTRAVENOUS
  Filled 2017-03-20: qty 50

## 2017-03-20 MED ORDER — SODIUM CHLORIDE 0.9 % IV BOLUS (SEPSIS)
1000.0000 mL | Freq: Once | INTRAVENOUS | Status: AC
  Administered 2017-03-20: 1000 mL via INTRAVENOUS

## 2017-03-20 MED ORDER — ONDANSETRON 8 MG PO TBDP: 8.0000 mg | ORAL_TABLET | Freq: Three times a day (TID) | ORAL | 0 refills | Status: DC | PRN

## 2017-03-20 MED ORDER — PANTOPRAZOLE SODIUM 40 MG PO TBEC: 40.0000 mg | DELAYED_RELEASE_TABLET | Freq: Every day | ORAL | 0 refills | Status: DC

## 2017-03-20 MED ORDER — ONDANSETRON HCL 4 MG/2ML IJ SOLN
4.0000 mg | Freq: Once | INTRAMUSCULAR | Status: AC
  Administered 2017-03-20: 4 mg via INTRAVENOUS
  Filled 2017-03-20: qty 2

## 2017-03-20 MED ORDER — HYDROMORPHONE HCL 1 MG/ML IJ SOLN
1.0000 mg | Freq: Once | INTRAMUSCULAR | Status: AC
  Administered 2017-03-20: 1 mg via INTRAVENOUS
  Filled 2017-03-20: qty 1

## 2017-03-20 MED ORDER — HYDROMORPHONE HCL 1 MG/ML IJ SOLN
0.5000 mg | Freq: Once | INTRAMUSCULAR | Status: AC
Start: 2017-03-20 — End: 2017-03-20
  Administered 2017-03-20: 0.5 mg via INTRAVENOUS
  Filled 2017-03-20: qty 1

## 2017-03-20 NOTE — ED Triage Notes (Signed)
Patient c/o upper abd pain that started Friday with nausea, vomiting, and diarrhea. Denies any fevers, blood in stool, or urinary symptoms. Patient has hx of pancreatitis in which patient states pain feels the same. Patient states pain does radiate to back.

## 2017-03-20 NOTE — Discharge Instructions (Addendum)
It was our pleasure to provide your ER care today - we hope that you feel better.  Take protonix (acid blocker medication).  You may take zofran as need for nausea.  Follow up with primary care doctor in the next 1-2 days for recheck if symptoms fail to improve/resolve.  Return to ER if worse, new symptoms, fevers, persistent vomiting, other concern.   You were given pain medication in the ER - no driving for the next 6 hours.
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
## 2017-03-20 NOTE — ED Notes (Signed)
Pt stated that she is not able to urinate at this time.  Pt is aware that urine sample needed.

## 2017-06-13 ENCOUNTER — Other Ambulatory Visit: Payer: Self-pay

## 2017-06-13 ENCOUNTER — Inpatient Hospital Stay (HOSPITAL_COMMUNITY)
Admission: EM | Admit: 2017-06-13 | Discharge: 2017-06-16 | DRG: 440 | Disposition: A | Discharge status: Home / Self Care | Payer: Medicaid Other | Attending: Internal Medicine | Admitting: Internal Medicine

## 2017-06-13 ENCOUNTER — Encounter (HOSPITAL_COMMUNITY): Payer: Self-pay | Admitting: Emergency Medicine

## 2017-06-13 DIAGNOSIS — K859 Acute pancreatitis without necrosis or infection, unspecified: Principal | ICD-10-CM | POA: Diagnosis present

## 2017-06-13 DIAGNOSIS — Z888 Allergy status to other drugs, medicaments and biological substances status: Secondary | ICD-10-CM

## 2017-06-13 DIAGNOSIS — Z720 Tobacco use: Secondary | ICD-10-CM | POA: Diagnosis present

## 2017-06-13 DIAGNOSIS — K861 Other chronic pancreatitis: Secondary | ICD-10-CM | POA: Diagnosis present

## 2017-06-13 DIAGNOSIS — Z886 Allergy status to analgesic agent status: Secondary | ICD-10-CM | POA: Diagnosis not present

## 2017-06-13 DIAGNOSIS — F17210 Nicotine dependence, cigarettes, uncomplicated: Secondary | ICD-10-CM | POA: Diagnosis present

## 2017-06-13 DIAGNOSIS — F418 Other specified anxiety disorders: Secondary | ICD-10-CM | POA: Diagnosis present

## 2017-06-13 DIAGNOSIS — G479 Sleep disorder, unspecified: Secondary | ICD-10-CM | POA: Diagnosis present

## 2017-06-13 DIAGNOSIS — Z91030 Bee allergy status: Secondary | ICD-10-CM | POA: Diagnosis not present

## 2017-06-13 DIAGNOSIS — R739 Hyperglycemia, unspecified: Secondary | ICD-10-CM | POA: Diagnosis present

## 2017-06-13 DIAGNOSIS — Z885 Allergy status to narcotic agent status: Secondary | ICD-10-CM

## 2017-06-13 DIAGNOSIS — Z79899 Other long term (current) drug therapy: Secondary | ICD-10-CM | POA: Diagnosis not present

## 2017-06-13 DIAGNOSIS — K219 Gastro-esophageal reflux disease without esophagitis: Secondary | ICD-10-CM | POA: Diagnosis present

## 2017-06-13 LAB — URINALYSIS, ROUTINE W REFLEX MICROSCOPIC
BACTERIA UA: NONE SEEN
Bilirubin Urine: NEGATIVE
Glucose, UA: 50 mg/dL — AB
HGB URINE DIPSTICK: NEGATIVE
Ketones, ur: 5 mg/dL — AB
Leukocytes, UA: NEGATIVE
NITRITE: NEGATIVE
Protein, ur: 30 mg/dL — AB
SPECIFIC GRAVITY, URINE: 1.031 — AB (ref 1.005–1.030)
pH: 5 (ref 5.0–8.0)

## 2017-06-13 LAB — COMPREHENSIVE METABOLIC PANEL
ALBUMIN: 4.9 g/dL (ref 3.5–5.0)
ALT: 19 U/L (ref 14–54)
AST: 20 U/L (ref 15–41)
Alkaline Phosphatase: 75 U/L (ref 38–126)
Anion gap: 9 (ref 5–15)
BUN: 10 mg/dL (ref 6–20)
CALCIUM: 9.7 mg/dL (ref 8.9–10.3)
CO2: 23 mmol/L (ref 22–32)
Chloride: 105 mmol/L (ref 101–111)
Creatinine, Ser: 0.62 mg/dL (ref 0.44–1.00)
GFR calc Af Amer: >60 mL/min (ref >60)
GFR calc non Af Amer: >60 mL/min (ref >60)
GLUCOSE: 142 mg/dL — AB (ref 65–99)
Potassium: 3.6 mmol/L (ref 3.5–5.1)
SODIUM: 137 mmol/L (ref 135–145)
Total Bilirubin: 0.3 mg/dL (ref 0.3–1.2)
Total Protein: 7.9 g/dL (ref 6.5–8.1)

## 2017-06-13 LAB — CBC
HEMATOCRIT: 41 % (ref 36.0–46.0)
HEMOGLOBIN: 13.7 g/dL (ref 12.0–15.0)
MCH: 29.9 pg (ref 26.0–34.0)
MCHC: 33.4 g/dL (ref 30.0–36.0)
MCV: 89.5 fL (ref 78.0–100.0)
Platelets: 359 10*3/uL (ref 150–400)
RBC: 4.58 MIL/uL (ref 3.87–5.11)
RDW: 13.5 % (ref 11.5–15.5)
WBC: 11 10*3/uL — ABNORMAL HIGH (ref 4.0–10.5)

## 2017-06-13 LAB — I-STAT BETA HCG BLOOD, ED (MC, WL, AP ONLY)

## 2017-06-13 LAB — PREGNANCY, URINE: Preg Test, Ur: NEGATIVE

## 2017-06-13 LAB — LIPASE, BLOOD: Lipase: 1023 U/L — ABNORMAL HIGH (ref 11–51)

## 2017-06-13 MED ORDER — ONDANSETRON HCL 4 MG/2ML IJ SOLN
4.0000 mg | Freq: Once | INTRAMUSCULAR | Status: AC
  Administered 2017-06-13: 4 mg via INTRAVENOUS
  Filled 2017-06-13: qty 2

## 2017-06-13 MED ORDER — PANTOPRAZOLE SODIUM 40 MG IV SOLR
40.0000 mg | INTRAVENOUS | Status: DC
  Administered 2017-06-13 – 2017-06-15 (×3): 40 mg via INTRAVENOUS
  Filled 2017-06-13 (×3): qty 40

## 2017-06-13 MED ORDER — POTASSIUM CHLORIDE IN NACL 20-0.9 MEQ/L-% IV SOLN: INTRAVENOUS | Status: AC

## 2017-06-13 MED ORDER — HYDROMORPHONE HCL 1 MG/ML IJ SOLN
1.0000 mg | INTRAMUSCULAR | Status: DC | PRN
  Administered 2017-06-13: 1 mg via INTRAVENOUS
  Filled 2017-06-13: qty 1

## 2017-06-13 MED ORDER — DIPHENHYDRAMINE HCL 50 MG/ML IJ SOLN
12.5000 mg | Freq: Once | INTRAMUSCULAR | Status: AC
  Administered 2017-06-13: 12.5 mg via INTRAVENOUS
  Filled 2017-06-13: qty 1

## 2017-06-13 MED ORDER — POTASSIUM CHLORIDE IN NACL 20-0.9 MEQ/L-% IV SOLN
INTRAVENOUS | Status: DC
  Administered 2017-06-13: 22:00:00 via INTRAVENOUS
  Filled 2017-06-13: qty 1000

## 2017-06-13 MED ORDER — SODIUM CHLORIDE 0.9 % IV SOLN
INTRAVENOUS | Status: DC
  Administered 2017-06-14 – 2017-06-16 (×5): via INTRAVENOUS

## 2017-06-13 MED ORDER — ONDANSETRON HCL 4 MG/2ML IJ SOLN
4.0000 mg | Freq: Four times a day (QID) | INTRAMUSCULAR | Status: DC | PRN
  Administered 2017-06-13 – 2017-06-16 (×10): 4 mg via INTRAVENOUS
  Filled 2017-06-13 (×10): qty 2

## 2017-06-13 MED ORDER — SODIUM CHLORIDE 0.9 % IV BOLUS (SEPSIS)
2000.0000 mL | Freq: Once | INTRAVENOUS | Status: AC
  Administered 2017-06-13: 2000 mL via INTRAVENOUS

## 2017-06-13 MED ORDER — ONDANSETRON HCL 4 MG PO TABS
4.0000 mg | ORAL_TABLET | Freq: Four times a day (QID) | ORAL | Status: DC | PRN
  Filled 2017-06-13: qty 1
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
## 2017-06-13 NOTE — ED Triage Notes (Signed)
Pt with a hx of recurrent pancreatitis c/o upper abdominal pain with n/v/d that began today.
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
## 2017-06-14 DIAGNOSIS — K8501 Idiopathic acute pancreatitis with uninfected necrosis: Secondary | ICD-10-CM

## 2017-06-14 LAB — CBC WITH DIFFERENTIAL/PLATELET
BASOS PCT: 0 %
Basophils Absolute: 0 10*3/uL (ref 0.0–0.1)
EOS ABS: 0.4 10*3/uL (ref 0.0–0.7)
EOS PCT: 5 %
HCT: 33 % — ABNORMAL LOW (ref 36.0–46.0)
HEMOGLOBIN: 10.8 g/dL — AB (ref 12.0–15.0)
Lymphocytes Relative: 54 %
Lymphs Abs: 4 10*3/uL (ref 0.7–4.0)
MCH: 29.9 pg (ref 26.0–34.0)
MCHC: 32.7 g/dL (ref 30.0–36.0)
MCV: 91.4 fL (ref 78.0–100.0)
MONO ABS: 0.6 10*3/uL (ref 0.1–1.0)
Monocytes Relative: 8 %
NEUTROS PCT: 33 %
Neutro Abs: 2.4 10*3/uL (ref 1.7–7.7)
PLATELETS: 256 10*3/uL (ref 150–400)
RBC: 3.61 MIL/uL — ABNORMAL LOW (ref 3.87–5.11)
RDW: 13.7 % (ref 11.5–15.5)
WBC: 7.4 10*3/uL (ref 4.0–10.5)

## 2017-06-14 LAB — COMPREHENSIVE METABOLIC PANEL
ALK PHOS: 53 U/L (ref 38–126)
ALT: 13 U/L — AB (ref 14–54)
AST: 14 U/L — AB (ref 15–41)
Albumin: 3.5 g/dL (ref 3.5–5.0)
Anion gap: 6 (ref 5–15)
BUN: 8 mg/dL (ref 6–20)
CALCIUM: 8.2 mg/dL — AB (ref 8.9–10.3)
CHLORIDE: 109 mmol/L (ref 101–111)
CO2: 22 mmol/L (ref 22–32)
CREATININE: 0.55 mg/dL (ref 0.44–1.00)
GFR calc Af Amer: >60 mL/min (ref >60)
GFR calc non Af Amer: >60 mL/min (ref >60)
GLUCOSE: 93 mg/dL (ref 65–99)
Potassium: 3.5 mmol/L (ref 3.5–5.1)
SODIUM: 137 mmol/L (ref 135–145)
Total Bilirubin: 0.6 mg/dL (ref 0.3–1.2)
Total Protein: 5.7 g/dL — ABNORMAL LOW (ref 6.5–8.1)

## 2017-06-14 LAB — LIPASE, BLOOD: Lipase: 59 U/L — ABNORMAL HIGH (ref 11–51)

## 2017-06-14 MED ORDER — HYDROMORPHONE HCL 1 MG/ML IJ SOLN
1.0000 mg | INTRAMUSCULAR | Status: DC | PRN
  Administered 2017-06-14 – 2017-06-16 (×14): 1 mg via INTRAVENOUS
  Filled 2017-06-14 (×14): qty 1

## 2017-06-14 MED ORDER — NICOTINE 14 MG/24HR TD PT24
14.0000 mg | MEDICATED_PATCH | Freq: Every day | TRANSDERMAL | Status: DC | PRN
  Administered 2017-06-14: 14 mg via TRANSDERMAL
  Filled 2017-06-14: qty 1

## 2017-06-14 NOTE — ED Notes (Signed)
Pt states she wants to see a patient advocate. Told her we have veryyyyyy critical patients here. She states it's been 45 mins before anybody comes in. Told her down here is not like upstairs. We have very critical patients and it takes us awhile to get to everybody. Her concerns are noted.

## 2017-06-14 NOTE — Progress Notes (Signed)
Patient seen and examined this morning.  Database reviewed, no family members at bedside.  She was admitted earlier this morning at around 4 AM due to abdominal pain, nausea and vomiting thought due to acute on chronic pancreatitis.  She has had recurrent episodes of this.  I agree with n.p.o. and Dilaudid for now.  Once we start advancing diet, would switch her over to oral pain medications to avoid narcotic seeking behavior.  We will continue to follow closely.  Of note, when I saw her this morning patient was very upset about delay in her moving upstairs.  Did explain that hospital is quite full and it may take a while for this to happen.  Peggye PittEstela Hernandez, MD [facility] Pager: [phone]

## 2017-06-15 DIAGNOSIS — Z720 Tobacco use: Secondary | ICD-10-CM

## 2017-06-15 DIAGNOSIS — K859 Acute pancreatitis without necrosis or infection, unspecified: Principal | ICD-10-CM

## 2017-06-15 LAB — CBC WITH DIFFERENTIAL/PLATELET
BASOS PCT: 1 %
Basophils Absolute: 0.1 10*3/uL (ref 0.0–0.1)
EOS ABS: 0.5 10*3/uL (ref 0.0–0.7)
Eosinophils Relative: 7 %
HCT: 37.1 % (ref 36.0–46.0)
Hemoglobin: 12.2 g/dL (ref 12.0–15.0)
Lymphocytes Relative: 48 %
Lymphs Abs: 3.6 10*3/uL (ref 0.7–4.0)
MCH: 30 pg (ref 26.0–34.0)
MCHC: 32.9 g/dL (ref 30.0–36.0)
MCV: 91.4 fL (ref 78.0–100.0)
MONO ABS: 0.8 10*3/uL (ref 0.1–1.0)
MONOS PCT: 10 %
Neutro Abs: 2.6 10*3/uL (ref 1.7–7.7)
Neutrophils Relative %: 34 %
PLATELETS: 294 10*3/uL (ref 150–400)
RBC: 4.06 MIL/uL (ref 3.87–5.11)
RDW: 13.6 % (ref 11.5–15.5)
WBC: 7.5 10*3/uL (ref 4.0–10.5)

## 2017-06-15 LAB — COMPREHENSIVE METABOLIC PANEL
ALT: 14 U/L (ref 14–54)
ANION GAP: 14 (ref 5–15)
AST: 14 U/L — ABNORMAL LOW (ref 15–41)
Albumin: 4.1 g/dL (ref 3.5–5.0)
Alkaline Phosphatase: 66 U/L (ref 38–126)
BUN: 5 mg/dL — ABNORMAL LOW (ref 6–20)
CHLORIDE: 104 mmol/L (ref 101–111)
CO2: 18 mmol/L — AB (ref 22–32)
Calcium: 8.9 mg/dL (ref 8.9–10.3)
Creatinine, Ser: 0.57 mg/dL (ref 0.44–1.00)
GFR calc non Af Amer: >60 mL/min (ref >60)
GLUCOSE: 63 mg/dL — AB (ref 65–99)
Potassium: 3.5 mmol/L (ref 3.5–5.1)
SODIUM: 136 mmol/L (ref 135–145)
Total Bilirubin: 1 mg/dL (ref 0.3–1.2)
Total Protein: 6.8 g/dL (ref 6.5–8.1)

## 2017-06-15 LAB — LIPASE, BLOOD: Lipase: 28 U/L (ref 11–51)

## 2017-06-15 NOTE — Progress Notes (Signed)
Denies nausea since starting clear liquids for lunch

## 2017-06-15 NOTE — Progress Notes (Signed)
PROGRESS NOTE                                                                                                                                                                                                             Patient Demographics:    [name], is a 32 y.o. female, DOB - 03/26/1986, WUJ:[id number]:[name]  Admit date - 06/13/2017   Admitting Physician Bobette Moavid Manuel Ortiz, MD  [facility]  LOS - 2  Outpatient Specialists: None  Chief Complaint  Patient presents with  . Abdominal Pain       Brief Narrative 32 year old female with recurrent pancreatitis, opiate dependent, anxiety and depression presented with severe epigastric pain typical of pancreatitis since 1 day prior to admission associated with 4 episodes of vomiting and 4 episodes of watery diarrhea.  She was following with a PCP until one month back who moved to [place].  Patient was taking Phenergan by herself which did not relieve her symptoms so came to the ED. In the ED vitals were stable with lipase > 1000.  Admitted for acute recurrent pancreatitis.   Subjective:   Denies some nausea but no vomiting.  Still has epigastric abdominal pain.  No further diarrhea.  Wants to try clear liquid.   Assessment  & Plan :    Principal Problem:   Acute on chronic pancreatitis Kept n.p.o. along with IV fluids and supportive care with antiemetics, PRN IV Dilaudid 1 mg every 4 hours.  (Will not escalate further and once tolerating p.o. we will introduce oxycodone as needed). Lipase normalized.  No further vomiting or diarrhea.  Will start clear liquid and slowly advance as tolerated.  Active Problems:   Tobacco abuse Reports she is cutting down and is down to 5 cigarettes a day.  Continue nicotine patch. Counseled on cessation.  Anxiety and depression Continue Celexa.    Hyperglycemia Check  A1c        Code Status : full code  Family Communication  : None at bedside  Disposition Plan  : Home once improved, possibly in 1-2 days  Barriers For Discharge : Active symptoms  Consults  : None  Procedures  : None  DVT Prophylaxis  :  Lovenox -   Lab Results  Component Value Date   PLT 294 06/15/2017  Antibiotics  :    Anti-infectives (From admission, onward)   None        Objective:   Vitals:   06/14/17 1804 06/14/17 1900 06/14/17 2018 06/15/17 0300  BP: 121/83 117/73 (!) 131/109 110/62  Pulse: 72 78 84 92  Resp: 15 15 15 15   Temp:   98.4 F (36.9 C) 98.6 F (37 C)  TempSrc:   Oral Oral  SpO2: 100% 100% 99% 100%  Weight:      Height:        Wt Readings from Last 3 Encounters:  06/13/17 51.3 kg (113 lb)  03/20/17 54.4 kg (120 lb)  03/03/16 59 kg (130 lb)     Intake/Output Summary (Last 24 hours) at 06/15/2017 1203 Last data filed at 06/15/2017 0654 Gross per 24 hour  Intake 1148 ml  Output -  Net 1148 ml     Physical Exam  Gen: not in distress HEENT: Dry mucosa mucosa, supple neck Chest: clear b/l, no added sounds CVS: N S1&S2, no murmurs, GI: soft, nondistended, bowel sounds present, epigastric tenderness to palpation Musculoskeletal: warm, no edema     Data Review:    CBC Recent Labs  Lab 06/13/17 1714 06/14/17 0700 06/15/17 0500  WBC 11.0* 7.4 7.5  HGB 13.7 10.8* 12.2  HCT 41.0 33.0* 37.1  PLT 359 256 294  MCV 89.5 91.4 91.4  MCH 29.9 29.9 30.0  MCHC 33.4 32.7 32.9  RDW 13.5 13.7 13.6  LYMPHSABS  --  4.0 3.6  MONOABS  --  0.6 0.8  EOSABS  --  0.4 0.5  BASOSABS  --  0.0 0.1    Chemistries  Recent Labs  Lab 06/13/17 1714 06/14/17 0700 06/15/17 0500  NA 137 137 136  K 3.6 3.5 3.5  CL 105 109 104  CO2 23 22 18*  GLUCOSE 142* 93 63*  BUN 10 8 5*  CREATININE 0.62 0.55 0.57  CALCIUM 9.7 8.2* 8.9  AST 20 14* 14*  ALT 19 13* 14  ALKPHOS 75 53 66  BILITOT 0.3 0.6 1.0    ------------------------------------------------------------------------------------------------------------------ No results for input(s): CHOL, HDL, LDLCALC, TRIG, CHOLHDL, LDLDIRECT in the last 72 hours.  No results found for: HGBA1C ------------------------------------------------------------------------------------------------------------------ No results for input(s): TSH, T4TOTAL, T3FREE, THYROIDAB in the last 72 hours.  Invalid input(s): FREET3 ------------------------------------------------------------------------------------------------------------------ No results for input(s): VITAMINB12, FOLATE, FERRITIN, TIBC, IRON, RETICCTPCT in the last 72 hours.  Coagulation profile No results for input(s): INR, PROTIME in the last 168 hours.  No results for input(s): DDIMER in the last 72 hours.  Cardiac Enzymes No results for input(s): CKMB, TROPONINI, MYOGLOBIN in the last 168 hours.  Invalid input(s): CK ------------------------------------------------------------------------------------------------------------------ No results found for: BNP  Inpatient Medications  Scheduled Meds: . pantoprazole (PROTONIX) IV  40 mg Intravenous Q24H   Continuous Infusions: . sodium chloride 100 mL/hr at 06/15/17 0529   PRN Meds:.HYDROmorphone (DILAUDID) injection, nicotine, ondansetron **OR** ondansetron (ZOFRAN) IV  Micro Results No results found for this or any previous visit (from the past 240 hour(s)).  Radiology Reports No results found.  Time Spent in minutes  25   Jniyah Dantuono M.D on 06/15/2017 at 12:03 PM  Between 7am to 7pm - Pager - [phone]  After 7pm go to [email or web address] - password [facility]  [facility] -  Office  [phone]

## 2017-06-16 DIAGNOSIS — K859 Acute pancreatitis without necrosis or infection, unspecified: Secondary | ICD-10-CM | POA: Diagnosis present

## 2017-06-16 DIAGNOSIS — F418 Other specified anxiety disorders: Secondary | ICD-10-CM

## 2017-06-16 MED ORDER — NICOTINE 14 MG/24HR TD PT24: 14.0000 mg | MEDICATED_PATCH | Freq: Every day | TRANSDERMAL | 0 refills | Status: DC

## 2017-06-16 MED ORDER — ONDANSETRON 8 MG PO TBDP: 8.0000 mg | ORAL_TABLET | Freq: Three times a day (TID) | ORAL | 0 refills | Status: DC | PRN

## 2017-06-16 MED ORDER — PROMETHAZINE HCL 25 MG PO TABS: 25.0000 mg | ORAL_TABLET | Freq: Four times a day (QID) | ORAL | 0 refills | Status: DC | PRN

## 2017-06-16 MED ORDER — OXYCODONE-ACETAMINOPHEN 5-325 MG PO TABS: 1.0000 | ORAL_TABLET | Freq: Four times a day (QID) | ORAL | 0 refills | Status: DC | PRN

## 2017-06-16 MED ORDER — OXYCODONE-ACETAMINOPHEN 5-325 MG PO TABS
2.0000 | ORAL_TABLET | ORAL | Status: DC | PRN
  Administered 2017-06-16: 2 via ORAL
  Filled 2017-06-16: qty 2

## 2017-06-16 MED ORDER — PANTOPRAZOLE SODIUM 40 MG PO TBEC
40.0000 mg | DELAYED_RELEASE_TABLET | Freq: Every day | ORAL | Status: DC
  Administered 2017-06-16: 40 mg via ORAL
  Filled 2017-06-16: qty 1

## 2017-06-16 NOTE — Discharge Instructions (Signed)
Acute Pancreatitis  Acute pancreatitis is a condition in which the pancreas suddenly becomes irritated and swollen (has inflammation). The pancreas is a gland that is located behind the stomach. It produces enzymes that help to digest food. The pancreas also releases the hormones glucagon and insulin, which help to regulate blood sugar. Damage to the pancreas occurs when the digestive enzymes from the pancreas are activated before they are released into the intestine.  Most acute attacks last a couple of days and can cause serious problems. Some people become dehydrated and develop low blood pressure. In severe cases, bleeding into the pancreas can lead to shock and can be life-threatening. The lungs, heart, and kidneys may fail.  What are the causes?  The most common causes of this condition are:  · Alcohol abuse.  · Gallstones.    Other causes include:  · Certain medicines.  · Exposure to certain chemicals.  · Infection.  · Damage caused by an accident (trauma).  · Abdominal surgery.    In some cases, the cause may not be known.  What are the signs or symptoms?  Symptoms of this condition include:  · Pain in the upper abdomen that may radiate to the back.  · Tenderness and swelling of the abdomen.  · Nausea and vomiting.    How is this diagnosed?  This condition may be diagnosed based on:  · A physical exam.  · Blood tests.  · Imaging tests, such as X-rays, CT scans, or an ultrasound of the abdomen.    How is this treated?  Treatment for this condition usually requires a stay in the hospital. Treatment may include:  · Pain medicine.  · Fluid replacement through an IV tube.  · Placing a tube in the stomach to remove stomach contents and to control vomiting (NG tube, or nasogastric tube).  · Not eating for 3-4 days. This gives the pancreas a rest, because enzymes are not being produced that can cause further damage.  · Antibiotic medicines, if your condition is caused by an infection.  · Surgery on the pancreas or  gallbladder.    Follow these instructions at home:  Eating and drinking  · Follow instructions from your health care provider about diet. This may involve avoiding alcohol and decreasing the amount of fat in your diet.  · Eat smaller, more frequent meals. This reduces the amount of digestive fluids that the pancreas produces.  · Drink enough fluid to keep your urine clear or pale yellow.  · Do not drink alcohol if it caused your condition.  General instructions  · Take over-the-counter and prescription medicines only as told by your health care provider.  · Do not use any tobacco products, such as cigarettes, chewing tobacco, and e-cigarettes. If you need help quitting, ask your health care provider.  · Get plenty of rest.  · If directed, check your blood sugar at home as told by your health care provider.  · Keep all follow-up visits as told by your health care provider. This is important.  Contact a health care provider if:  · You do not recover as quickly as expected.  · You develop new or worsening symptoms.  · You have persistent pain, weakness, or nausea.  · You recover and then have another episode of pain.  · You have a fever.  Get help right away if:  · You cannot eat or keep fluids down.  · Your pain becomes severe.  · Your skin or the   white part of your eyes turns yellow (jaundice).  · You vomit.  · You feel dizzy or you faint.  · Your blood sugar is high (over 300 mg/dL).  This information is not intended to replace advice given to you by your health care provider. Make sure you discuss any questions you have with your health care provider.  Document Released: 04/05/2005 Document Revised: 08/13/2015 Document Reviewed: 01/07/2015  Elsevier Interactive Patient Education © 2018 [employer]

## 2017-06-16 NOTE — Progress Notes (Signed)
Stated cannot take tylenol for extended periods.  Texted Dr. Gonzella Lexhungel several times and he talked with patient concerning this.

## 2017-06-16 NOTE — Progress Notes (Signed)
Denies nausea since eating fair amount of lunch soft diet.  Texted Dr Gonzella Lexhungel

## 2017-06-16 NOTE — Discharge Summary (Signed)
Physician Discharge Summary  [name] LKG:[id number] DOB: 03-16-1986 DOA: 06/13/2017  PCP: Roselie Awkward, MD (currently in [place]) Admit date: 06/13/2017 Discharge date: 06/16/2017  Admitted From: Home Disposition: Home  Recommendations for Outpatient Follow-up:  1. Follow up with PCP in 1-2 weeks (reports having appointment)   Home Health: None Equipment/Devices: None  Discharge Condition: Fair CODE STATUS: Full code Diet recommendation: Soft diet advance to regular    Discharge Diagnoses:  Principal Problem:   Acute recurrent pancreatitis  Active Problems:   Tobacco abuse   Depression with anxiety   Chronic recurrent pancreatitis (HCC)   Hyperglycemia  Brief narrative/HPI 32 year old female with recurrent pancreatitis, opiate dependent, anxiety and depression presented with severe epigastric pain typical of pancreatitis since 1 day prior to admission associated with 4 episodes of vomiting and 4 episodes of watery diarrhea.  She was following with a PCP until one month back who moved to [place].  Patient was taking Phenergan by herself which did not relieve her symptoms so came to the ED. In the ED vitals were stable with lipase > 1000.  Admitted for acute recurrent pancreatitis.  Principal Problem:   Acute recurrent pancreatitis No clear etiology to trigger her current symptoms. Provided supportive care with n.p.o., IV fluids, PRN low-dose IV Dilaudid and antiemetics.  Started on clears and advance to soft diet and tolerating well.  Lipase normalized.  No further nausea, vomiting or diarrhea.  Abdominal pain improved as well.  Tolerating p.o. Percocet. I will discharge her on a short course of oral Percocet (15 tablets) along with as needed Zofran and Phenergan for nausea and vomiting.   Patient reports that she missed her appointment with PCP this week (who moved to [facility] recently and also prescribes her pain medications).  She has rescheduled her  appointment in 2 weeks.  Active Problems:   Tobacco abuse Reports she is cutting down and is down to 5 cigarettes a day.    Prescribed nicotine patch. Counseled on cessation.  Anxiety and depression Continue Celexa.    Hyperglycemia CBG in 120s-140s.  Check A1c as outpatient.  Consults: None  Procedures: None Family communication: At bedside  Disposition: Home   Discharge Instructions   Allergies as of 06/16/2017      Reactions   Bee Venom Anaphylaxis   Other Anaphylaxis, Other (See Comments)   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     Fentanyl    Chest heaviness   Morphine Itching   Toradol [ketorolac Tromethamine] Other (See Comments)   Anxiety and burning in the stomach   Reglan [metoclopramide] Anxiety      Medication List    STOP taking these medications   oxyCODONE 15 MG immediate release tablet Commonly known as:  ROXICODONE   zolpidem 5 MG tablet Commonly known as:  AMBIEN     TAKE these medications   citalopram 20 MG tablet Commonly known as:  CELEXA Take 1 tablet (20 mg total) by mouth daily.   nicotine 14 mg/24hr patch Commonly known as:  NICODERM CQ - dosed in mg/24 hours Place 1 patch (14 mg total) onto the skin daily.   ondansetron 8 MG disintegrating tablet Commonly known as:  ZOFRAN ODT Take 1 tablet (8 mg total) by mouth every 8 (eight) hours as needed for nausea or vomiting. What changed:  Another medication with the same name was removed. Continue taking this medication, and follow the directions you see here.   oxyCODONE-acetaminophen 5-325 MG tablet Commonly known as:  PERCOCET/ROXICET Take 1 tablet by mouth every 6 (six) hours as needed for moderate pain.   pantoprazole 40 MG tablet Commonly known as:  PROTONIX Take 1 tablet (40 mg total) by mouth daily.   promethazine 25 MG tablet Commonly known as:  PHENERGAN Take 1 tablet (25 mg total) by mouth every 6 (six) hours as needed for nausea or vomiting. What changed:   See the new instructions.      Follow-up Information    Roselie AwkwardPlummer, Charles, MD. Schedule an appointment as soon as possible for a visit in 1 week(s).   Specialty:  Anesthesiology Contact information: [address] [place] [id number] [phone]          Allergies  Allergen Reactions  . Bee Venom Anaphylaxis  . 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    . Fentanyl     Chest heaviness  . Morphine Itching  . Toradol [Ketorolac Tromethamine] Other (See Comments)    Anxiety and burning in the stomach  . Reglan [Metoclopramide] Anxiety        Procedures/Studies:  No results found.    Subjective: Abdominal pain better.  No nausea or vomiting.  Tolerating soft diet.  Discharge Exam: Vitals:   06/16/17 0545 06/16/17 1449  BP: 126/86 (!) 135/98  Pulse: 94 84  Resp:  18  Temp: (!) 97.5 F (36.4 C) 98.7 F (37.1 C)  SpO2: 98% 100%   Vitals:   06/15/17 1614 06/15/17 2300 06/16/17 0545 06/16/17 1449  BP: 112/85 119/80 126/86 (!) 135/98  Pulse: 96 100 94 84  Resp: 16 20  18   Temp: 99 F (37.2 C) 98.3 F (36.8 C) (!) 97.5 F (36.4 C) 98.7 F (37.1 C)  TempSrc: Oral Oral Oral Oral  SpO2: 100% 100% 98% 100%  Weight:      Height:       General: Young thin built female not in distress HEENT: Moist mucosa, supple neck Chest: Clear to auscultation bilaterally CVS: Normal S1 and S2, no murmurs GI: Soft, nondistended, bowel sounds present, mild epigastric tenderness Musculoskeletal: Warm, no edema     The results of significant diagnostics from this hospitalization (including imaging, microbiology, ancillary and laboratory) are listed below for reference.     Microbiology: No results found for this or any previous visit (from the past 240 hour(s)).   Labs: BNP (last 3 results) No results for input(s): BNP in the last 8760 hours. Basic Metabolic Panel: Recent Labs  Lab 06/13/17 1714  06/14/17 0700 06/15/17 0500  NA 137 137 136  K 3.6 3.5 3.5  CL 105 109 104  CO2 23 22 18*  GLUCOSE 142* 93 63*  BUN 10 8 5*  CREATININE 0.62 0.55 0.57  CALCIUM 9.7 8.2* 8.9   Liver Function Tests: Recent Labs  Lab 06/13/17 1714 06/14/17 0700 06/15/17 0500  AST 20 14* 14*  ALT 19 13* 14  ALKPHOS 75 53 66  BILITOT 0.3 0.6 1.0  PROT 7.9 5.7* 6.8  ALBUMIN 4.9 3.5 4.1   Recent Labs  Lab 06/13/17 1714 06/14/17 0700 06/15/17 0500  LIPASE 1,023* 59* 28   No results for input(s): AMMONIA in the last 168 hours. CBC: Recent Labs  Lab 06/13/17 1714 06/14/17 0700 06/15/17 0500  WBC 11.0* 7.4 7.5  NEUTROABS  --  2.4 2.6  HGB 13.7 10.8* 12.2  HCT 41.0 33.0* 37.1  MCV 89.5 91.4 91.4  PLT 359 256 294   Cardiac  Enzymes: No results for input(s): CKTOTAL, CKMB, CKMBINDEX, TROPONINI in the last 168 hours. BNP: Invalid input(s): POCBNP CBG: No results for input(s): GLUCAP in the last 168 hours. D-Dimer No results for input(s): DDIMER in the last 72 hours. Hgb A1c No results for input(s): HGBA1C in the last 72 hours. Lipid Profile No results for input(s): CHOL, HDL, LDLCALC, TRIG, CHOLHDL, LDLDIRECT in the last 72 hours. Thyroid function studies No results for input(s): TSH, T4TOTAL, T3FREE, THYROIDAB in the last 72 hours.  Invalid input(s): FREET3 Anemia work up No results for input(s): VITAMINB12, FOLATE, FERRITIN, TIBC, IRON, RETICCTPCT in the last 72 hours. Urinalysis    Component Value Date/Time   COLORURINE YELLOW 06/13/2017 1717   APPEARANCEUR HAZY (A) 06/13/2017 1717   LABSPEC 1.031 (H) 06/13/2017 1717   PHURINE 5.0 06/13/2017 1717   GLUCOSEU 50 (A) 06/13/2017 1717   HGBUR NEGATIVE 06/13/2017 1717   BILIRUBINUR NEGATIVE 06/13/2017 1717   KETONESUR 5 (A) 06/13/2017 1717   PROTEINUR 30 (A) 06/13/2017 1717   UROBILINOGEN 0.2 02/09/2015 1109   NITRITE NEGATIVE 06/13/2017 1717   LEUKOCYTESUR NEGATIVE 06/13/2017 1717   Sepsis Labs Invalid input(s):  PROCALCITONIN,  WBC,  LACTICIDVEN Microbiology No results found for this or any previous visit (from the past 240 hour(s)).   Time coordinating discharge: < 30 minutes  SIGNED:   Eddie North, MD  [facility] 06/16/2017, 3:28 PM Pager   If 7PM-7AM, please contact night-coverage [email or web address] Password [email or web address]

## 2017-06-16 NOTE — Progress Notes (Signed)
IV removed, scripts given and to call primary doctor for follow up.  [facility]

## 2017-08-14 ENCOUNTER — Other Ambulatory Visit: Payer: Self-pay

## 2017-08-14 ENCOUNTER — Inpatient Hospital Stay (HOSPITAL_COMMUNITY)
Admission: EM | Admit: 2017-08-14 | Discharge: 2017-08-19 | DRG: 439 | Disposition: A | Discharge status: Home / Self Care | Payer: Medicaid Other | Attending: Internal Medicine | Admitting: Internal Medicine

## 2017-08-14 ENCOUNTER — Encounter (HOSPITAL_COMMUNITY): Payer: Self-pay | Admitting: *Deleted

## 2017-08-14 DIAGNOSIS — Z681 Body mass index (BMI) 19 or less, adult: Secondary | ICD-10-CM

## 2017-08-14 DIAGNOSIS — Z91018 Allergy to other foods: Secondary | ICD-10-CM

## 2017-08-14 DIAGNOSIS — K861 Other chronic pancreatitis: Secondary | ICD-10-CM | POA: Diagnosis present

## 2017-08-14 DIAGNOSIS — K8681 Exocrine pancreatic insufficiency: Secondary | ICD-10-CM | POA: Diagnosis present

## 2017-08-14 DIAGNOSIS — Z886 Allergy status to analgesic agent status: Secondary | ICD-10-CM

## 2017-08-14 DIAGNOSIS — Z79899 Other long term (current) drug therapy: Secondary | ICD-10-CM

## 2017-08-14 DIAGNOSIS — R748 Abnormal levels of other serum enzymes: Secondary | ICD-10-CM

## 2017-08-14 DIAGNOSIS — E440 Moderate protein-calorie malnutrition: Secondary | ICD-10-CM

## 2017-08-14 DIAGNOSIS — Z720 Tobacco use: Secondary | ICD-10-CM | POA: Diagnosis present

## 2017-08-14 DIAGNOSIS — Z91030 Bee allergy status: Secondary | ICD-10-CM

## 2017-08-14 DIAGNOSIS — F418 Other specified anxiety disorders: Secondary | ICD-10-CM | POA: Diagnosis present

## 2017-08-14 DIAGNOSIS — K859 Acute pancreatitis without necrosis or infection, unspecified: Principal | ICD-10-CM | POA: Diagnosis present

## 2017-08-14 DIAGNOSIS — Z885 Allergy status to narcotic agent status: Secondary | ICD-10-CM

## 2017-08-14 DIAGNOSIS — K828 Other specified diseases of gallbladder: Secondary | ICD-10-CM | POA: Diagnosis present

## 2017-08-14 DIAGNOSIS — Z8711 Personal history of peptic ulcer disease: Secondary | ICD-10-CM

## 2017-08-14 DIAGNOSIS — K219 Gastro-esophageal reflux disease without esophagitis: Secondary | ICD-10-CM | POA: Diagnosis present

## 2017-08-14 DIAGNOSIS — F1910 Other psychoactive substance abuse, uncomplicated: Secondary | ICD-10-CM | POA: Diagnosis present

## 2017-08-14 DIAGNOSIS — G894 Chronic pain syndrome: Secondary | ICD-10-CM

## 2017-08-14 DIAGNOSIS — Z888 Allergy status to other drugs, medicaments and biological substances status: Secondary | ICD-10-CM

## 2017-08-14 DIAGNOSIS — R109 Unspecified abdominal pain: Secondary | ICD-10-CM

## 2017-08-14 DIAGNOSIS — Q453 Other congenital malformations of pancreas and pancreatic duct: Secondary | ICD-10-CM

## 2017-08-14 DIAGNOSIS — I10 Essential (primary) hypertension: Secondary | ICD-10-CM | POA: Diagnosis present

## 2017-08-14 DIAGNOSIS — Z818 Family history of other mental and behavioral disorders: Secondary | ICD-10-CM

## 2017-08-14 DIAGNOSIS — F17210 Nicotine dependence, cigarettes, uncomplicated: Secondary | ICD-10-CM | POA: Diagnosis present

## 2017-08-14 DIAGNOSIS — Z8659 Personal history of other mental and behavioral disorders: Secondary | ICD-10-CM

## 2017-08-14 LAB — CBC WITH DIFFERENTIAL/PLATELET
BASOS ABS: 0 10*3/uL (ref 0.0–0.1)
Basophils Relative: 0 %
Eosinophils Absolute: 0.4 10*3/uL (ref 0.0–0.7)
Eosinophils Relative: 3 %
HCT: 37.9 % (ref 36.0–46.0)
HEMOGLOBIN: 12.4 g/dL (ref 12.0–15.0)
Lymphocytes Relative: 20 %
Lymphs Abs: 2.1 10*3/uL (ref 0.7–4.0)
MCH: 29.7 pg (ref 26.0–34.0)
MCHC: 32.7 g/dL (ref 30.0–36.0)
MCV: 90.9 fL (ref 78.0–100.0)
Monocytes Absolute: 1.2 10*3/uL — ABNORMAL HIGH (ref 0.1–1.0)
Monocytes Relative: 11 %
NEUTROS ABS: 7.2 10*3/uL (ref 1.7–7.7)
NEUTROS PCT: 66 %
Platelets: 240 10*3/uL (ref 150–400)
RBC: 4.17 MIL/uL (ref 3.87–5.11)
RDW: 13.7 % (ref 11.5–15.5)
WBC: 11 10*3/uL — ABNORMAL HIGH (ref 4.0–10.5)

## 2017-08-14 LAB — I-STAT CG4 LACTIC ACID, ED: Lactic Acid, Venous: 0.53 mmol/L (ref 0.5–1.9)

## 2017-08-14 MED ORDER — ONDANSETRON HCL 4 MG/2ML IJ SOLN
4.0000 mg | Freq: Once | INTRAMUSCULAR | Status: AC
  Administered 2017-08-14: 4 mg via INTRAVENOUS
  Filled 2017-08-14: qty 2

## 2017-08-14 MED ORDER — MORPHINE SULFATE (PF) 4 MG/ML IV SOLN
4.0000 mg | Freq: Once | INTRAVENOUS | Status: AC
  Administered 2017-08-14: 4 mg via INTRAVENOUS
  Filled 2017-08-14: qty 1

## 2017-08-14 MED ORDER — SODIUM CHLORIDE 0.9 % IV BOLUS
1000.0000 mL | Freq: Once | INTRAVENOUS | Status: AC
  Administered 2017-08-14: 1000 mL via INTRAVENOUS
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
## 2017-08-14 NOTE — ED Triage Notes (Signed)
Pt c/o abd pain with n/v that started today, has hx of pancreatitis and states that this feels the same,

## 2017-08-15 ENCOUNTER — Encounter (HOSPITAL_COMMUNITY): Payer: Self-pay | Admitting: Gastroenterology

## 2017-08-15 DIAGNOSIS — Z720 Tobacco use: Secondary | ICD-10-CM | POA: Diagnosis not present

## 2017-08-15 DIAGNOSIS — K859 Acute pancreatitis without necrosis or infection, unspecified: Principal | ICD-10-CM

## 2017-08-15 DIAGNOSIS — Z681 Body mass index (BMI) 19 or less, adult: Secondary | ICD-10-CM | POA: Diagnosis not present

## 2017-08-15 DIAGNOSIS — F418 Other specified anxiety disorders: Secondary | ICD-10-CM | POA: Diagnosis present

## 2017-08-15 DIAGNOSIS — Z91030 Bee allergy status: Secondary | ICD-10-CM | POA: Diagnosis not present

## 2017-08-15 DIAGNOSIS — Z8711 Personal history of peptic ulcer disease: Secondary | ICD-10-CM | POA: Diagnosis not present

## 2017-08-15 DIAGNOSIS — G894 Chronic pain syndrome: Secondary | ICD-10-CM

## 2017-08-15 DIAGNOSIS — F17210 Nicotine dependence, cigarettes, uncomplicated: Secondary | ICD-10-CM | POA: Diagnosis present

## 2017-08-15 DIAGNOSIS — K861 Other chronic pancreatitis: Secondary | ICD-10-CM

## 2017-08-15 DIAGNOSIS — K828 Other specified diseases of gallbladder: Secondary | ICD-10-CM | POA: Diagnosis present

## 2017-08-15 DIAGNOSIS — Z8659 Personal history of other mental and behavioral disorders: Secondary | ICD-10-CM | POA: Diagnosis not present

## 2017-08-15 DIAGNOSIS — Z79899 Other long term (current) drug therapy: Secondary | ICD-10-CM | POA: Diagnosis not present

## 2017-08-15 DIAGNOSIS — Z886 Allergy status to analgesic agent status: Secondary | ICD-10-CM | POA: Diagnosis not present

## 2017-08-15 DIAGNOSIS — E440 Moderate protein-calorie malnutrition: Secondary | ICD-10-CM | POA: Diagnosis present

## 2017-08-15 DIAGNOSIS — Q453 Other congenital malformations of pancreas and pancreatic duct: Secondary | ICD-10-CM | POA: Diagnosis not present

## 2017-08-15 DIAGNOSIS — I10 Essential (primary) hypertension: Secondary | ICD-10-CM | POA: Diagnosis present

## 2017-08-15 DIAGNOSIS — Z888 Allergy status to other drugs, medicaments and biological substances status: Secondary | ICD-10-CM | POA: Diagnosis not present

## 2017-08-15 DIAGNOSIS — Z91018 Allergy to other foods: Secondary | ICD-10-CM | POA: Diagnosis not present

## 2017-08-15 DIAGNOSIS — Z818 Family history of other mental and behavioral disorders: Secondary | ICD-10-CM | POA: Diagnosis not present

## 2017-08-15 DIAGNOSIS — K219 Gastro-esophageal reflux disease without esophagitis: Secondary | ICD-10-CM | POA: Diagnosis present

## 2017-08-15 DIAGNOSIS — K8681 Exocrine pancreatic insufficiency: Secondary | ICD-10-CM | POA: Diagnosis present

## 2017-08-15 DIAGNOSIS — R1013 Epigastric pain: Secondary | ICD-10-CM | POA: Diagnosis not present

## 2017-08-15 DIAGNOSIS — Z885 Allergy status to narcotic agent status: Secondary | ICD-10-CM | POA: Diagnosis not present

## 2017-08-15 DIAGNOSIS — F1910 Other psychoactive substance abuse, uncomplicated: Secondary | ICD-10-CM | POA: Diagnosis present

## 2017-08-15 DIAGNOSIS — R748 Abnormal levels of other serum enzymes: Secondary | ICD-10-CM | POA: Diagnosis not present

## 2017-08-15 LAB — CBC WITH DIFFERENTIAL/PLATELET
Basophils Absolute: 0 10*3/uL (ref 0.0–0.1)
Basophils Relative: 0 %
EOS ABS: 0.3 10*3/uL (ref 0.0–0.7)
Eosinophils Relative: 3 %
HCT: 35.9 % — ABNORMAL LOW (ref 36.0–46.0)
Hemoglobin: 11.5 g/dL — ABNORMAL LOW (ref 12.0–15.0)
LYMPHS ABS: 2.7 10*3/uL (ref 0.7–4.0)
Lymphocytes Relative: 28 %
MCH: 29.7 pg (ref 26.0–34.0)
MCHC: 32 g/dL (ref 30.0–36.0)
MCV: 92.8 fL (ref 78.0–100.0)
Monocytes Absolute: 1.2 10*3/uL — ABNORMAL HIGH (ref 0.1–1.0)
Monocytes Relative: 12 %
NEUTROS PCT: 57 %
Neutro Abs: 5.5 10*3/uL (ref 1.7–7.7)
Platelets: 244 10*3/uL (ref 150–400)
RBC: 3.87 MIL/uL (ref 3.87–5.11)
RDW: 13.9 % (ref 11.5–15.5)
WBC: 9.7 10*3/uL (ref 4.0–10.5)

## 2017-08-15 LAB — COMPREHENSIVE METABOLIC PANEL
ALBUMIN: 3.8 g/dL (ref 3.5–5.0)
ALBUMIN: 4 g/dL (ref 3.5–5.0)
ALK PHOS: 185 U/L — AB (ref 38–126)
ALK PHOS: 198 U/L — AB (ref 38–126)
ALT: 15 U/L (ref 14–54)
ALT: 15 U/L (ref 14–54)
AST: 18 U/L (ref 15–41)
AST: 14 U/L — AB (ref 15–41)
Anion gap: 10 (ref 5–15)
Anion gap: 9 (ref 5–15)
BILIRUBIN TOTAL: 0.7 mg/dL (ref 0.3–1.2)
BUN: 5 mg/dL — ABNORMAL LOW (ref 6–20)
BUN: 14 mg/dL (ref 6–20)
CALCIUM: 8.5 mg/dL — AB (ref 8.9–10.3)
CALCIUM: 9.5 mg/dL (ref 8.9–10.3)
CO2: 21 mmol/L — AB (ref 22–32)
CO2: 25 mmol/L (ref 22–32)
CREATININE: 0.55 mg/dL (ref 0.44–1.00)
Chloride: 105 mmol/L (ref 101–111)
Chloride: 110 mmol/L (ref 101–111)
Creatinine, Ser: 0.57 mg/dL (ref 0.44–1.00)
GFR calc Af Amer: >60 mL/min (ref >60)
GFR calc non Af Amer: >60 mL/min (ref >60)
GFR calc non Af Amer: >60 mL/min (ref >60)
GLUCOSE: 80 mg/dL (ref 65–99)
GLUCOSE: 109 mg/dL — AB (ref 65–99)
Potassium: 3.5 mmol/L (ref 3.5–5.1)
Potassium: 3.6 mmol/L (ref 3.5–5.1)
SODIUM: 140 mmol/L (ref 135–145)
SODIUM: 140 mmol/L (ref 135–145)
TOTAL PROTEIN: 7.2 g/dL (ref 6.5–8.1)
Total Bilirubin: 0.5 mg/dL (ref 0.3–1.2)
Total Protein: 6.7 g/dL (ref 6.5–8.1)

## 2017-08-15 LAB — LIPASE, BLOOD
Lipase: 1010 U/L — ABNORMAL HIGH (ref 11–51)
Lipase: 972 U/L — ABNORMAL HIGH (ref 11–51)

## 2017-08-15 LAB — URINALYSIS, ROUTINE W REFLEX MICROSCOPIC
Bilirubin Urine: NEGATIVE
GLUCOSE, UA: NEGATIVE mg/dL
Hgb urine dipstick: NEGATIVE
Ketones, ur: NEGATIVE mg/dL
LEUKOCYTES UA: NEGATIVE
NITRITE: NEGATIVE
PH: 6 (ref 5.0–8.0)
Protein, ur: NEGATIVE mg/dL
SPECIFIC GRAVITY, URINE: 1.013 (ref 1.005–1.030)

## 2017-08-15 MED ORDER — ONDANSETRON HCL 4 MG PO TABS: 4.0000 mg | ORAL_TABLET | Freq: Four times a day (QID) | ORAL | Status: DC | PRN

## 2017-08-15 MED ORDER — SODIUM CHLORIDE 0.9 % IV BOLUS
1000.0000 mL | Freq: Once | INTRAVENOUS | Status: AC
  Administered 2017-08-15: 1000 mL via INTRAVENOUS

## 2017-08-15 MED ORDER — HYDROMORPHONE HCL 1 MG/ML IJ SOLN
1.0000 mg | Freq: Once | INTRAMUSCULAR | Status: AC
Start: 2017-08-15 — End: 2017-08-15
  Administered 2017-08-15: 1 mg via INTRAVENOUS
  Filled 2017-08-15: qty 1

## 2017-08-15 MED ORDER — LACTATED RINGERS IV SOLN
INTRAVENOUS | Status: AC
  Administered 2017-08-15 – 2017-08-16 (×2): via INTRAVENOUS

## 2017-08-15 MED ORDER — ACETAMINOPHEN 325 MG PO TABS: 650.0000 mg | ORAL_TABLET | Freq: Four times a day (QID) | ORAL | Status: DC | PRN

## 2017-08-15 MED ORDER — HYDROMORPHONE HCL 1 MG/ML IJ SOLN
1.0000 mg | INTRAMUSCULAR | Status: DC | PRN
  Administered 2017-08-15 – 2017-08-19 (×46): 1 mg via INTRAVENOUS
  Filled 2017-08-15 (×47): qty 1

## 2017-08-15 MED ORDER — DIPHENHYDRAMINE HCL 50 MG/ML IJ SOLN
25.0000 mg | Freq: Once | INTRAMUSCULAR | Status: AC
  Administered 2017-08-15: 25 mg via INTRAVENOUS
  Filled 2017-08-15: qty 1

## 2017-08-15 MED ORDER — HYDROMORPHONE HCL 1 MG/ML IJ SOLN
1.0000 mg | Freq: Once | INTRAMUSCULAR | Status: AC
  Administered 2017-08-15: 1 mg via INTRAVENOUS
  Filled 2017-08-15: qty 1

## 2017-08-15 MED ORDER — METOCLOPRAMIDE HCL 5 MG/ML IJ SOLN: 10.0000 mg | Freq: Once | INTRAMUSCULAR | Status: DC

## 2017-08-15 MED ORDER — ACETAMINOPHEN 650 MG RE SUPP: 650.0000 mg | Freq: Four times a day (QID) | RECTAL | Status: DC | PRN

## 2017-08-15 MED ORDER — POTASSIUM CHLORIDE IN NACL 20-0.9 MEQ/L-% IV SOLN
INTRAVENOUS | Status: DC
  Administered 2017-08-15 (×2): via INTRAVENOUS

## 2017-08-15 MED ORDER — ENOXAPARIN SODIUM 40 MG/0.4ML ~~LOC~~ SOLN
40.0000 mg | SUBCUTANEOUS | Status: DC
Start: 2017-08-15 — End: 2017-08-19
  Filled 2017-08-15: qty 0.4

## 2017-08-15 MED ORDER — NICOTINE 7 MG/24HR TD PT24
7.0000 mg | MEDICATED_PATCH | Freq: Every day | TRANSDERMAL | Status: DC
  Filled 2017-08-15 (×6): qty 1

## 2017-08-15 MED ORDER — ONDANSETRON HCL 4 MG/2ML IJ SOLN
4.0000 mg | Freq: Four times a day (QID) | INTRAMUSCULAR | Status: DC
  Administered 2017-08-15 – 2017-08-19 (×16): 4 mg via INTRAVENOUS
  Filled 2017-08-15 (×17): qty 2

## 2017-08-15 MED ORDER — PROMETHAZINE HCL 25 MG/ML IJ SOLN
25.0000 mg | Freq: Once | INTRAMUSCULAR | Status: AC
  Administered 2017-08-15: 25 mg via INTRAVENOUS
  Filled 2017-08-15: qty 1

## 2017-08-15 MED ORDER — POTASSIUM CHLORIDE 2 MEQ/ML IV SOLN
INTRAVENOUS | Status: DC
  Filled 2017-08-15 (×3): qty 1000

## 2017-08-15 MED ORDER — LACTATED RINGERS IV SOLN
INTRAVENOUS | Status: DC
  Administered 2017-08-15: 05:00:00 via INTRAVENOUS

## 2017-08-15 MED ORDER — ONDANSETRON HCL 4 MG/2ML IJ SOLN
4.0000 mg | Freq: Four times a day (QID) | INTRAMUSCULAR | Status: DC | PRN
  Administered 2017-08-15 – 2017-08-19 (×8): 4 mg via INTRAVENOUS
  Filled 2017-08-15 (×9): qty 2
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
## 2017-08-15 NOTE — Progress Notes (Signed)
PROGRESS NOTE  [name] ZOX:[id number] DOB: Jan 30, 1986 DOA: 08/14/2017 PCP: No primary care provider on file.  Brief History:  32 year old female with a history of tobacco abuse, marijuana use, chronic/recurrent pancreatitis, chronic abdominal pain, depression, opioid dependence presenting with 1 week history of epigastric pain that significantly worsened on 08/14/2017 with associated nausea and vomiting.  The patient denies taking any new medications.  She was recently admitted to the hospital from 06/13/2017 through 06/16/2017 for acute pancreatitis.  The patient previously followed GI, Dr. Darrick Penna,, but she appears to have been lost to follow-up.  She last saw Dr. Chestine Spore at [facility] on 05/11/2013 at which time she had an endoscopic ultrasound.  The endoscopic ultrasound revealed an atrophic pancreas with scattered hypoechoic stranding and foci likely sequelae of prior pancreatitis.  MRCP on 03/06/2012 was negative for pancreatic divisum. The patient denies any alcohol use, but states that she has used marijuana.  She denies any illegal drug use.  She smokes about 4 to 5 cigarettes/day.  She denies any fevers, chills, chest pain, shortness breath, dysuria, hematuria, hematochezia, melena.  She has had some loose stools.  Upon admission, the patient was noted to have a serum lipase of 1010 with WBC 11.0 and normal AST, ALT.  GI was consulted to assist with management.  Assessment/Plan: Acute on chronic pancreatitis -Presented with serum lipase 1010 -Continue bowel rest, n.p.o. except for ice chips -Continue IV fluids -Judicious pain control -CT abdomen and pelvis -GI consult  Polysubstance abuse/tobacco abuse -I have discussed tobacco cessation with the patient.  I have counseled the patient regarding the negative impacts of continued tobacco use including but not limited to lung cancer, COPD, and cardiovascular disease.  I have discussed alternatives to tobacco and modalities that may  help facilitate tobacco cessation including but not limited to biofeedback, hypnosis, and medications.  Total time spent with tobacco counseling was 4 minutes. -UDS  Chronic pain syndrome -The [place] Controlled Substance Reporting System has been queried for this patient for the past 12 months -She received Percocet 5/25 on 06/16/2017, #15 -She received oxycodone 10 mg on 05/17/2017, #90 -Prior to the above prescriptions, she had not had any opioid filled since 12/14/2016  Depression -Continue Celexa   Disposition Plan:   Home in 2-3 days  Family Communication:   Family at bedside updated--Total time spent 35 minutes.  Greater than 50% spent face to face counseling and coordinating care.  Consultants:  GI  Code Status:  FULL   DVT Prophylaxis:  [place] Heparin    Procedures: As Listed in Progress Note Above  Antibiotics: None    Subjective: Patient complains of abdominal pain not much better than yesterday.  She has not had any emesis since admission, but she feels nauseous.  She denies any chest pain, shortness breath, cough, hemoptysis, medication, melena, dysuria, hematuria, headache, neck pain.  Objective: Vitals:   08/15/17 0200 08/15/17 0230 08/15/17 0323 08/15/17 0417  BP: 106/81 123/88 (!) 141/99 118/82  Pulse: (!) 59 71 71 83  Resp:   16   Temp:    98.3 F (36.8 C)  TempSrc:    Oral  SpO2: 99% 98% 99% 99%  Weight:    51.9 kg (114 lb 6.7 oz)  Height:     (1.626 m)    Intake/Output Summary (Last 24 hours) at 08/15/2017 0831 Last data filed at 08/15/2017 0445 Gross per 24 hour  Intake 2000 ml  Output -  Net 2000 ml   Weight change:  Exam:   General:  Pt is alert, follows commands appropriately, not in acute distress  HEENT: No icterus, No thrush, No neck mass, [place]/AT  Cardiovascular: RRR, S1/S2, no rubs, no gallops  Respiratory: CTA bilaterally, no wheezing, no crackles, no rhonchi  Abdomen: Soft/+BS, diffuse tender, non distended, no  guarding  Extremities: No edema, No lymphangitis, No petechiae, No rashes, no synovitis   Data Reviewed: I have personally reviewed following labs and imaging studies Basic Metabolic Panel: Recent Labs  Lab 08/14/17 2332  NA 140  K 3.6  CL 105  CO2 25  GLUCOSE 109*  BUN 14  CREATININE 0.57  CALCIUM 9.5   Liver Function Tests: Recent Labs  Lab 08/14/17 2332  AST 14*  ALT 15  ALKPHOS 198*  BILITOT 0.7  PROT 7.2  ALBUMIN 4.0   Recent Labs  Lab 08/14/17 2332  LIPASE 1,010*   No results for input(s): AMMONIA in the last 168 hours. Coagulation Profile: No results for input(s): INR, PROTIME in the last 168 hours. CBC: Recent Labs  Lab 08/14/17 2332  WBC 11.0*  NEUTROABS 7.2  HGB 12.4  HCT 37.9  MCV 90.9  PLT 240   Cardiac Enzymes: No results for input(s): CKTOTAL, CKMB, CKMBINDEX, TROPONINI in the last 168 hours. BNP: Invalid input(s): POCBNP CBG: No results for input(s): GLUCAP in the last 168 hours. HbA1C: No results for input(s): HGBA1C in the last 72 hours. Urine analysis:    Component Value Date/Time   COLORURINE YELLOW 08/15/2017 0225   APPEARANCEUR CLEAR 08/15/2017 0225   LABSPEC 1.013 08/15/2017 0225   PHURINE 6.0 08/15/2017 0225   GLUCOSEU NEGATIVE 08/15/2017 0225   HGBUR NEGATIVE 08/15/2017 0225   BILIRUBINUR NEGATIVE 08/15/2017 0225   KETONESUR NEGATIVE 08/15/2017 0225   PROTEINUR NEGATIVE 08/15/2017 0225   UROBILINOGEN 0.2 02/09/2015 1109   NITRITE NEGATIVE 08/15/2017 0225   LEUKOCYTESUR NEGATIVE 08/15/2017 0225   Sepsis Labs: (procalcitonin:4,lacticidven:4) )No results found for this or any previous visit (from the past 240 hour(s)).   Scheduled Meds: . enoxaparin (LOVENOX) injection  40 mg Subcutaneous Q24H  . nicotine  7 mg Transdermal Daily   Continuous Infusions: . lactated ringers 150 mL/hr at 08/15/17 0445    Procedures/Studies: No results found.  Catarina Hartshorn, DO  [facility] Pager [phone]  If  7PM-7AM, please contact night-coverage [email or web address] Password [email or web address] 08/15/2017, 8:31 AM   LOS: 0 days
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
## 2017-08-15 NOTE — Progress Notes (Signed)
Ac not able to mix LR with 20K (Dr Darrick Penna) message sent to mid level.

## 2017-08-15 NOTE — Progress Notes (Signed)
Pt stated that she wanted to wait to apply the nicotine patch.

## 2017-08-15 NOTE — Plan of Care (Signed)
progressing 

## 2017-08-16 ENCOUNTER — Inpatient Hospital Stay (HOSPITAL_COMMUNITY): Payer: Medicaid Other

## 2017-08-16 DIAGNOSIS — E440 Moderate protein-calorie malnutrition: Secondary | ICD-10-CM

## 2017-08-16 LAB — COMPREHENSIVE METABOLIC PANEL
ALK PHOS: 171 U/L — AB (ref 38–126)
ALT: 15 U/L (ref 14–54)
AST: 16 U/L (ref 15–41)
Albumin: 3.6 g/dL (ref 3.5–5.0)
Anion gap: 12 (ref 5–15)
CALCIUM: 8.9 mg/dL (ref 8.9–10.3)
CO2: 23 mmol/L (ref 22–32)
CREATININE: 0.54 mg/dL (ref 0.44–1.00)
Chloride: 103 mmol/L (ref 101–111)
GFR calc non Af Amer: >60 mL/min (ref >60)
GLUCOSE: 74 mg/dL (ref 65–99)
Potassium: 3.6 mmol/L (ref 3.5–5.1)
SODIUM: 138 mmol/L (ref 135–145)
Total Bilirubin: 0.8 mg/dL (ref 0.3–1.2)
Total Protein: 6.6 g/dL (ref 6.5–8.1)

## 2017-08-16 LAB — CBC
HCT: 34.6 % — ABNORMAL LOW (ref 36.0–46.0)
Hemoglobin: 11.4 g/dL — ABNORMAL LOW (ref 12.0–15.0)
MCH: 30.5 pg (ref 26.0–34.0)
MCHC: 32.9 g/dL (ref 30.0–36.0)
MCV: 92.5 fL (ref 78.0–100.0)
PLATELETS: 225 10*3/uL (ref 150–400)
RBC: 3.74 MIL/uL — ABNORMAL LOW (ref 3.87–5.11)
RDW: 13.7 % (ref 11.5–15.5)
WBC: 8 10*3/uL (ref 4.0–10.5)

## 2017-08-16 LAB — HCG, SERUM, QUALITATIVE: Preg, Serum: NEGATIVE

## 2017-08-16 LAB — LIPASE, BLOOD: LIPASE: 115 U/L — AB (ref 11–51)

## 2017-08-16 LAB — HIV ANTIBODY (ROUTINE TESTING W REFLEX): HIV Screen 4th Generation wRfx: NONREACTIVE

## 2017-08-16 MED ORDER — LACTATED RINGERS IV SOLN
INTRAVENOUS | Status: DC
  Administered 2017-08-16: 11:00:00 via INTRAVENOUS

## 2017-08-16 MED ORDER — PROMETHAZINE HCL 25 MG/ML IJ SOLN
25.0000 mg | Freq: Four times a day (QID) | INTRAMUSCULAR | Status: DC | PRN
  Administered 2017-08-16 – 2017-08-19 (×7): 25 mg via INTRAVENOUS
  Filled 2017-08-16 (×8): qty 1

## 2017-08-16 MED ORDER — POTASSIUM CHLORIDE 2 MEQ/ML IV SOLN
INTRAVENOUS | Status: DC
  Administered 2017-08-16 – 2017-08-19 (×5): via INTRAVENOUS
  Filled 2017-08-16 (×16): qty 1000

## 2017-08-16 MED ORDER — PANTOPRAZOLE SODIUM 40 MG PO TBEC
40.0000 mg | DELAYED_RELEASE_TABLET | Freq: Two times a day (BID) | ORAL | Status: DC
  Administered 2017-08-16 – 2017-08-19 (×7): 40 mg via ORAL
  Filled 2017-08-16 (×7): qty 1

## 2017-08-16 MED ORDER — LACTATED RINGERS IV SOLN
INTRAVENOUS | Status: AC
  Administered 2017-08-16: 23:00:00 via INTRAVENOUS

## 2017-08-16 MED ORDER — IOPAMIDOL (ISOVUE-300) INJECTION 61%
100.0000 mL | Freq: Once | INTRAVENOUS | Status: AC | PRN
  Administered 2017-08-16: 100 mL via INTRAVENOUS

## 2017-08-16 MED ORDER — IOPAMIDOL (ISOVUE-300) INJECTION 61%
30.0000 mL | Freq: Once | INTRAVENOUS | Status: AC | PRN
  Administered 2017-08-16: 30 mL via ORAL

## 2017-08-16 MED ORDER — BOOST / RESOURCE BREEZE PO LIQD CUSTOM
1.0000 | Freq: Two times a day (BID) | ORAL | Status: DC
  Administered 2017-08-17 – 2017-08-18 (×3): 1 via ORAL

## 2017-08-16 NOTE — Progress Notes (Addendum)
Spoke with Henrene Dodge, RD, who recently evaluated patient. Patient desires clear liquid diet. Nausea improved. Orders placed.   I have attempted to call the floor but no answer. If she continues to have pain or recurrent pain, needs CT scan. The last imaging was done at [facility] in Feb 2019 (see consult note with details regarding prior imaging).

## 2017-08-16 NOTE — Progress Notes (Addendum)
I was able to speak to Raynelle Fanning, RN taking care of patient today. Patient has had round the clock IV pain medicine and calling in more frequent intervals. Although I spoke with RD and it appeared patient was doing better, she is requiring more frequent narcotic dosing. Last CT scan in Feb 2019 at [facility]. At this point, need to proceed with CT abd/pelvis with contrast to ensure no complicating features. NPO.    Although patient has had a tubal ligation, obtaining pregnancy screen to have on file for this admission. CT aware. Plans for CT around 5:15 today.

## 2017-08-16 NOTE — Progress Notes (Addendum)
CT results noted. Chronic pancreatitis, with interval ductal dilatation. No biliary dilatation. PT NEEDS GI EVALUATION FOLLOWED BY PROBABLE EUS/ERCP AT [facility] AS AN OUTPATIENT IN 4-6 WEEKS.  2340: RESULTS OF CT CONVEYED TO PT VIA PRIMARY NURSE.

## 2017-08-16 NOTE — Progress Notes (Signed)
Pt informed RN that she was due in Court this am. Requesting Note faxed to [facility] informing them she is in hospital. Note sent.

## 2017-08-16 NOTE — Progress Notes (Signed)
PROGRESS NOTE  [name] ZOX:[id number] DOB: 04/11/1986 DOA: 08/14/2017 PCP: Patient, No Pcp Per  Brief History:  32 year old female with a history of tobacco abuse, marijuana use, chronic/recurrent pancreatitis, chronic abdominal pain, depression, opioid dependence presenting with 1 week history of epigastric pain that significantly worsened on 08/14/2017 with associated nausea and vomiting.  The patient denies taking any new medications.  She was recently admitted to the hospital from 06/13/2017 through 06/16/2017 for acute pancreatitis.  The patient previously followed GI, Dr. Darrick Penna,, but she appears to have been lost to follow-up.  She last saw Dr. Chestine Spore at [facility] on 05/11/2013 at which time she had an endoscopic ultrasound.  The endoscopic ultrasound revealed an atrophic pancreas with scattered hypoechoic stranding and foci likely sequelae of prior pancreatitis.  MRCP on 03/06/2012 was negative for pancreatic divisum. The patient denies any alcohol use, but states that she has used marijuana.  She denies any illegal drug use.  She smokes about 4 to 5 cigarettes/day.  She denies any fevers, chills, chest pain, shortness breath, dysuria, hematuria, hematochezia, melena.  She has had some loose stools.  Upon admission, the patient was noted to have a serum lipase of 1010 with WBC 11.0 and normal AST, ALT.  GI was consulted to assist with management.  Assessment/Plan: Acute on chronic pancreatitis -Presented with serum lipase 1010>>>115 -pain is out of proportion with objective exam and lab findings -please avoid giving additional opioids in absence of objective evidence causing increased pain -Continue bowel rest, n.p.o. except for ice chips -Continue IV fluids -Judicious pain control--receiving IV hydromorphone around the clock -4/30 abd US--gallbladder sludge.  No cholecystitis.  No cholelithiasis -CT abdomen and pelvis--results pending -GI consult--appreciated-->CT abd -case  discussed with Dr. Darrick Penna -am lab  Chronic pain syndrome -The [facility] Controlled Substance Reporting System has been queried for this patient for the past 12 months -She received Percocet 5/25 on 06/16/2017, #15 -She received oxycodone 10 mg on 05/17/2017, #90 -Prior to the above prescriptions, she had not had any opioid filled since 12/14/2016 -pt claims that she is "stretching out" her oxycodone  Polysubstance abuse/tobacco abuse -I have discussed tobacco cessation with the patient.  I have counseled the patient regarding the negative impacts of continued tobacco use including but not limited to lung cancer, COPD, and cardiovascular disease.  I have discussed alternatives to tobacco and modalities that may help facilitate tobacco cessation including but not limited to biofeedback, hypnosis, and medications.  Total time spent with tobacco counseling was 4 minutes. -UDS  Depression -Continue Celexa  Moderate malnutrition -continue supplements   Disposition Plan:   Home in 2-3 days  Family Communication:  No Family at bedside   Consultants:  GI  Code Status:  FULL   DVT Prophylaxis:  [place] Heparin    Procedures: As Listed in Progress Note Above  Antibiotics: None      Subjective: Patient states that her abdominal pain is overall better until she vomited with the oral contrast for the CT of the abdomen.  She denies any chest pain, shortness breath, diarrhea, hematochezia, melena.  There is no fevers, chills, headache, neck pain.  Objective: Vitals:   08/15/17 1452 08/15/17 2157 08/16/17 0513 08/16/17 1253  BP: (!) 147/108 (!) 126/93 110/87 120/81  Pulse: 84 93 81 78  Resp: Temp: 98.2 F (36.8 C) 98.6 F (37 C) 98 F (36.7 C) 98.4 F (36.9 C)  TempSrc: Oral  Oral Oral Oral  SpO2: 99% 98% 99% 100%  Weight:      Height:        Intake/Output Summary (Last 24 hours) at 08/16/2017 1655 Last data filed at 08/16/2017 0513 Gross per 24 hour    Intake 120 ml  Output 1200 ml  Net -1080 ml   Weight change:  Exam:   General:  Pt is alert, follows commands appropriately, not in acute distress  HEENT: No icterus, No thrush, No neck mass, [place]/AT  Cardiovascular: RRR, S1/S2, no rubs, no gallops  Respiratory: CTA bilaterally, no wheezing, no crackles, no rhonchi  Abdomen: Soft/+BS, epigastric tender, non distended, no guarding  Extremities: No edema, No lymphangitis, No petechiae, No rashes, no synovitis   Data Reviewed: I have personally reviewed following labs and imaging studies Basic Metabolic Panel: Recent Labs  Lab 08/14/17 2332 08/15/17 1351 08/16/17 0414  NA 140 140 138  K 3.6 3.5 3.6  CL 105 110 103  CO2 25 21* 23  GLUCOSE 109* 80 74  BUN 14 5* <5*  CREATININE 0.57 0.55 0.54  CALCIUM 9.5 8.5* 8.9   Liver Function Tests: Recent Labs  Lab 08/14/17 2332 08/15/17 1351 08/16/17 0414  AST 14* 18 16  ALT ALKPHOS 198* 185* 171*  BILITOT 0.7 0.5 0.8  PROT 7.2 6.7 6.6  ALBUMIN 4.0 3.8 3.6   Recent Labs  Lab 08/14/17 2332 08/15/17 1351 08/16/17 0414  LIPASE 1,010* 972* 115*   No results for input(s): AMMONIA in the last 168 hours. Coagulation Profile: No results for input(s): INR, PROTIME in the last 168 hours. CBC: Recent Labs  Lab 08/14/17 2332 08/15/17 1351 08/16/17 0414  WBC 11.0* 9.7 8.0  NEUTROABS 7.2 5.5  --   HGB 12.4 11.5* 11.4*  HCT 37.9 35.9* 34.6*  MCV 90.9 92.8 92.5  PLT 240 244 225   Cardiac Enzymes: No results for input(s): CKTOTAL, CKMB, CKMBINDEX, TROPONINI in the last 168 hours. BNP: Invalid input(s): POCBNP CBG: No results for input(s): GLUCAP in the last 168 hours. HbA1C: No results for input(s): HGBA1C in the last 72 hours. Urine analysis:    Component Value Date/Time   COLORURINE YELLOW 08/15/2017 0225   APPEARANCEUR CLEAR 08/15/2017 0225   LABSPEC 1.013 08/15/2017 0225   PHURINE 6.0 08/15/2017 0225   GLUCOSEU NEGATIVE 08/15/2017 0225   HGBUR  NEGATIVE 08/15/2017 0225   BILIRUBINUR NEGATIVE 08/15/2017 0225   KETONESUR NEGATIVE 08/15/2017 0225   PROTEINUR NEGATIVE 08/15/2017 0225   UROBILINOGEN 0.2 02/09/2015 1109   NITRITE NEGATIVE 08/15/2017 0225   LEUKOCYTESUR NEGATIVE 08/15/2017 0225   Sepsis Labs: (procalcitonin:4,lacticidven:4) )No results found for this or any previous visit (from the past 240 hour(s)).   Scheduled Meds: . enoxaparin (LOVENOX) injection  40 mg Subcutaneous Q24H  . feeding supplement  1 Container Oral BID BM  . nicotine  7 mg Transdermal Daily  . ondansetron (ZOFRAN) IV  4 mg Intravenous QID  . pantoprazole  40 mg Oral BID AC   Continuous Infusions: . lactated ringers with kcl 100 mL/hr at 08/16/17 1403    Procedures/Studies: US Abdomen Limited Ruq  Result Date: 08/16/2017 CLINICAL DATA:  One day history of abdominal pain and elevated liver function studies. History of gastroesophageal reflux, pancreatitis. EXAM: ULTRASOUND ABDOMEN LIMITED RIGHT UPPER QUADRANT COMPARISON:  Abdominal and pelvic CT scan of May 25, 2017 and abdominal ultrasound of April 05, 2017. FINDINGS: Gallbladder: The gallbladder is adequately distended. There is echogenic bile or sludge present. No stones  or polyps are observed. The gallbladder wall exhibits no abnormal thickening. There is no pericholecystic fluid. There is no positive sonographic Murphy's sign. Common bile duct: Diameter: 1.9 mm Liver: The hepatic echotexture is normal. The surface contour is smooth. There is no focal mass nor ductal dilation. Portal vein is patent on color Doppler imaging with normal direction of blood flow towards the liver. IMPRESSION: Gallbladder sludge. No stones or sonographic evidence of acute cholecystitis. Normal appearance of the liver and common bile duct. Electronically Signed   By: Takeesha Isley  [place] M.D.   On: 08/16/2017 10:10    Catarina Hartshorn, DO  [facility] Pager [phone]  If 7PM-7AM, please contact  night-coverage [email or web address] Password [email or web address] 08/16/2017, 4:55 PM   LOS: 1 day

## 2017-08-16 NOTE — Progress Notes (Signed)
[place] with gallbladder sludge. CBD normal. No gallstones. Lipase added on this morning markedly improved, although clinically she feels that abdominal pain is worsened. I discussed with nursing staff earlier to call if she continues to have pain (recently had received IV narcotics just prior to me seeing patient) and/or nausea uncontrolled. Phenergan added this morning.

## 2017-08-16 NOTE — Progress Notes (Addendum)
Initial Nutrition Assessment  DOCUMENTATION CODES:  Non-severe (moderate) malnutrition in context of chronic illness  INTERVENTION:  Will converse with GI regarding potential diet advancement to clear liquids  Will add appropriate supplements/diet requests upon advancement  Offered low fat diet education, declined -already reports being well versed in it  NUTRITION DIAGNOSIS:  Moderate Malnutrition related to chronic illness (Chronic pancreatitis), vomiting, poor appetite, social / environmental circumstances (suspect element of opioid dependence/abuse in etiology of poor intake/malnutrition)  as evidenced by moderate fat depletion, mild muscle depletion  GOAL:  Patient will meet greater than or equal to 90% of their needs  MONITOR:  Diet advancement, Weight trends, Labs, I & O's  REASON FOR ASSESSMENT:  Malnutrition Screening Tool    ASSESSMENT:  32 y/o female PMHx Tobacco, marijuana use, chronic pancreatitis, depression/Anxiety, opioid abuse, GERD. Presented to ED with sudden onset epigastric pain w/ 3 episodes of emesis. Reports recent increased fatty food intake. Notes symptoms are similar to past pancreatitis flares. ED Work up revealed elevated lipase/alk phos. Admitted for AoC pancreatitis.    Patient reports that she had been eating/drinking well up until this recent bout of pancreatitis. She believes that eating a piece of cake and a hotdog at her sons Wallie Renshaw party is what precipitated the flare. She is frustrated because she reports choosing a lower fat cake on purpose.   She says she tries to follow a low fat diet. RD asked if she needed any ed material, but she declined, saying she has a 7-8 page handout on a low fat diet from [facility] that she still uses. She takes a hair, skin and nails mvi at baseline. She also drinks protein drinks (Boost, ensure, equate carnation, instant breakfast) when she is unable to tolerate a full meal to make sure she is "getting my proteins". This is  notable as Ensure/Boost as very high fat items; would expect these to not be tolerated at all.   Weight wise, weight history is difficult to trend, as it has been skewed from recent pregnancies, weighing up to 160s at one point in 2017. She reports weighing only 97-104 lbs prior to having her children in 2016 and 2017. She says she has been trying to "get back" to this weight (she believes she weighs less than her current listed weight of 114 lbs).   Per records, she is approximately the same weight as she was in February when she was admitted to [facility] with pancreatitis.   At this time, she reports her nausea is improved. Denies any more diarrhea. She says she would be willing to try a clear liquid diet. She lists some meal/food requests for when diet advanced to clears.  Will discuss with GI.   RD spent approximately 10 minutes talking to her, without her visually showing any signs of significant pain/discomfort, but as RD was about to walk out she quickly notes "I suddenly felt a sharp pain in my back when I sat up, if you see my nurse or Doctor out there could you ask them to bring me pain medication?".   Physical Exam: Moderate upper body fat wasting of thorax, underarms. Mild upper body muscle wasting of temporalis, deltoids, clavicular musculature. Moderate muscle wasting quadriceps.   Labs: WBC: 11.4, Lipase/alk phos Meds: Zofran, IVF, PRN opioids/antiemetics  Recent Labs  Lab 08/14/17 2332 08/15/17 1351 08/16/17 0414  NA 140 140 138  K 3.6 3.5 3.6  CL 105 110 103  CO2 25 21* 23  BUN 14 5* <5*  CREATININE 0.57 0.55 0.54  CALCIUM 9.5 8.5* 8.9  GLUCOSE 109* 80 74   NUTRITION - FOCUSED PHYSICAL EXAM:   Most Recent Value  Orbital Region  No depletion  Upper Arm Region  Moderate depletion  Thoracic and Lumbar Region  Moderate depletion  Buccal Region  Mild depletion  Temple Region  Mild depletion  Clavicle Bone Region  Mild depletion  Clavicle and Acromion Bone Region  Mild  depletion  Scapular Bone Region  Mild depletion  Dorsal Hand  No depletion  Patellar Region  No depletion  Anterior Thigh Region  Moderate depletion  Posterior Calf Region  No depletion     Diet Order:   Diet Order           Diet NPO time specified  Diet effective now         EDUCATION NEEDS:  No education needs have been identified at this time  Skin:  Skin Assessment: Reviewed RN Assessment  Last BM:  4/28  Height:  Ht Readings from Last 1 Encounters:  08/15/17 _0  (1.626 m)   Weight:  Wt Readings from Last 1 Encounters:  08/15/17 114 lb 6.7 oz (51.9 kg)   Wt Readings from Last 10 Encounters:  08/15/17 114 lb 6.7 oz (51.9 kg)  06/13/17 113 lb (51.3 kg)  03/20/17 120 lb (54.4 kg)  03/30/16 133 lb 9.6 oz (60.6 kg)  03/03/16 130 lb (59 kg)  02/12/16 136 lb (61.7 kg)  02/05/16 138 lb 12.8 oz (63 kg)  02/04/16 140 lb (63.5 kg)  02/03/16 140 lb (63.5 kg)  01/30/16 140 lb (63.5 kg)   Ideal Body Weight:  54.54 kg  BMI:  Body mass index is 19.64 kg/m.  Estimated Nutritional Needs:  Kcal:  1550-1750 (30-34 kcal/kg bw) Protein:  73-83 g (1.4-1.6 g/kg bw) Fluid:  >1.8 L fluid (35 ml/kg bw)  Burtis Junes RD, LDN, CNSC Clinical Nutrition Available Tues-Sat via Pager: [phone] 08/16/2017 12:53 PM

## 2017-08-16 NOTE — Progress Notes (Signed)
Subjective: Requiring dilaudid approximately every 2 hours. Nausea worsened. Just received IV Dilaudid approximately 15 minutes prior to me coming into the room. Feels like the nausea and pain "play off each other". Zofran around the clock scheduled but not helping as much. No active vomiting. Still with ice chips.   Objective: Vital signs in last 24 hours: Temp:  [98 F (36.7 C)-98.6 F (37 C)] 98 F (36.7 C) (04/30 0513) Pulse Rate:  [81-93] 81 (04/30 0513) Resp:  [18-20] 18 (04/30 0513) BP: (110-147)/(87-108) 110/87 (04/30 0513) SpO2:  [98 %-99 %] 99 % (04/30 0513) Last BM Date: 08/14/17 General:   Alert and oriented, pleasant but appears uncomfortable.  Head:  Normocephalic and atraumatic. Abdomen:  Bowel sounds present, soft,TTP epigastric, moreso than yesterday. no RUQ pain. No rebound or guarding Extremities:  Without clubbing or edema. Neurologic:  Alert and  oriented x4  Intake/Output from previous day: 04/29 0701 - 04/30 0700 In: 882.5 [P.O.:120; I.V.:762.5] Out: 1200 [Urine:1200] Intake/Output this shift: No intake/output data recorded.  Lab Results: Recent Labs    08/14/17 2332 08/15/17 1351 08/16/17 0414  WBC 11.0* 9.7 8.0  HGB 12.4 11.5* 11.4*  HCT 37.9 35.9* 34.6*  PLT 240 244 225   BMET Recent Labs    08/14/17 2332 08/15/17 1351 08/16/17 0414  NA 140 140 138  K 3.6 3.5 3.6  CL 105 110 103  CO2 25 21* 23  GLUCOSE 109* 80 74  BUN 14 5* <5*  CREATININE 0.57 0.55 0.54  CALCIUM 9.5 8.5* 8.9   LFT Recent Labs    08/14/17 2332 08/15/17 1351 08/16/17 0414  PROT 7.2 6.7 6.6  ALBUMIN 4.0 3.8 3.6  AST 14* 18 16  ALT _0 ALKPHOS 198* 185* 171*  BILITOT 0.7 0.5 0.8    Assessment: 32 year old female last seen years ago and has been lost to follow-up, presenting with acute on chronic pancreatitis. Lipase greater than 1000 on admission.  Multiple images on file in the system here, [facility], and at [facility], with most recent imaging in Feb  2019 at [facility] with chronic pancreatitis. US abdomen Dec 2018 without evidence for gallstones. She has been evaluated at [facility] in the past and felt to have more of an abdominal wall pain component. EUS in Jan 2015 at [facility] with atrophic pancreas and sequela of prior pancreatitis episodes. At one point, she was felt to have pancreas divisum or a variant thereof, but subsequent evaluation did not find this. Denies alcohol, does endorse occasional marijuana, and continues to smoke but has decreased this recently. She is requiring more frequent IV dilaudid. Nausea worsened from yesterday but no vomiting. Feels nausea worsens abdominal pain.    Bump in alk phos to 198 noted on admission, slowly trending down but remaining elevated. Transaminases and bilirubin remaining normal. Proceeding with RUQ ultrasound. If she has any worsening pain, consider CT: however, she has had numerous on file in multiple health care systems.  Ultimately, she would benefit from further evaluation as an outpatient at tertiary facility. Also recommend pancreatic enzymes long term once diet is advanced; for unknown reasons, she was taken off of these in the past.   Finally, need to document H.pylori eradication via breath test (H.pylori on EGD in 2013 without known if eradicated) as outpatient but would need to hold PPI X 2 WEEKS prior.    Plan: Continue Zofran scheduled around the clock Add phenergan prn for breakthrough nausea: may need scheduled Continue IV Dilaudid RUQ ultrasound  today Continue NPO except ice chips Consider CT if any worsening of pain or other concerning clinical features Add lipase on to morning labs Follow HFP, lipase, CBC Add PPI daily IV Start pancreatic enzymes when diet is able to be advanced Appears IV fluids are not on [facility] but still receiving: I have ordered this to ensure she continues to receive   Annitta Needs, PhD, ANP-BC [facility]     LOS: 1 day     08/16/2017, 7:20 AM

## 2017-08-17 ENCOUNTER — Telehealth: Payer: Self-pay | Admitting: Gastroenterology

## 2017-08-17 ENCOUNTER — Other Ambulatory Visit: Payer: Self-pay

## 2017-08-17 DIAGNOSIS — K861 Other chronic pancreatitis: Secondary | ICD-10-CM

## 2017-08-17 DIAGNOSIS — R748 Abnormal levels of other serum enzymes: Secondary | ICD-10-CM

## 2017-08-17 LAB — COMPREHENSIVE METABOLIC PANEL
ALT: 15 U/L (ref 14–54)
ANION GAP: 16 — AB (ref 5–15)
AST: 13 U/L — ABNORMAL LOW (ref 15–41)
Albumin: 3.9 g/dL (ref 3.5–5.0)
Alkaline Phosphatase: 196 U/L — ABNORMAL HIGH (ref 38–126)
BUN: <5 mg/dL — ABNORMAL LOW (ref 6–20)
CHLORIDE: 101 mmol/L (ref 101–111)
CO2: 17 mmol/L — AB (ref 22–32)
Calcium: 9.3 mg/dL (ref 8.9–10.3)
Creatinine, Ser: 0.69 mg/dL (ref 0.44–1.00)
Glucose, Bld: 63 mg/dL — ABNORMAL LOW (ref 65–99)
POTASSIUM: 4.3 mmol/L (ref 3.5–5.1)
SODIUM: 134 mmol/L — AB (ref 135–145)
Total Bilirubin: 1.2 mg/dL (ref 0.3–1.2)
Total Protein: 7.1 g/dL (ref 6.5–8.1)

## 2017-08-17 MED ORDER — HYDROXYZINE HCL 25 MG PO TABS
25.0000 mg | ORAL_TABLET | Freq: Three times a day (TID) | ORAL | Status: DC | PRN
  Administered 2017-08-17 – 2017-08-18 (×3): 25 mg via ORAL
  Filled 2017-08-17 (×3): qty 1

## 2017-08-17 NOTE — Progress Notes (Signed)
LR with 20 of Potassium not available. MD paged. Received order to infuse LR . CT results and GI updates delivered to patient. Patient conveyed understanding

## 2017-08-17 NOTE — Telephone Encounter (Signed)
PT NEEDS GI EVALUATION FOLLOWED BY PROBABLE EUS/ERCP AT [facility] AS AN OUTPATIENT IN 4-6 WEEKS per [facility].   See CT scan done 08/16/17. Send copy of it, copy of consult note and progress notes from our providers during current admission.

## 2017-08-17 NOTE — Progress Notes (Signed)
Subjective:  Persistent nausea and abd pain but wants clear liquids.   Objective: Vital signs in last 24 hours: Temp:  [98.4 F (36.9 C)-99 F (37.2 C)] 99 F (37.2 C) (05/01 0449) Pulse Rate:  [78-118] 118 (05/01 0449) Resp:  [16-20] 20 (04/30 2016) BP: (120-145)/(81-109) 126/109 (05/01 0449) SpO2:  [96 %-100 %] 98 % (05/01 0449) Last BM Date: 08/15/17 General:   Alert,  Well-developed, well-nourished, pleasant and cooperative in NAD. Appears uncomfortable. Head:  Normocephalic and atraumatic. Eyes:  Sclera clear, no icterus. Abdomen:  Soft, moderate epig tenderness and nondistended. Normal bowel sounds, without guarding, and without rebound.   Extremities:  Without clubbing, deformity or edema. Neurologic:  Alert and  oriented x4;  grossly normal neurologically. Skin:  Intact without significant lesions or rashes. Psych:  Alert and cooperative. Normal mood and affect.  Intake/Output from previous day: 04/30 0701 - 05/01 0700 In: 1495 [I.V.:1495] Out: -  Intake/Output this shift: No intake/output data recorded.  Lab Results: CBC Recent Labs    08/14/17 2332 08/15/17 1351 08/16/17 0414  WBC 11.0* 9.7 8.0  HGB 12.4 11.5* 11.4*  HCT 37.9 35.9* 34.6*  MCV 90.9 92.8 92.5  PLT 240 244 225   BMET Recent Labs    08/15/17 1351 08/16/17 0414 08/17/17 0545  NA 140 138 134*  K 3.5 3.6 4.3  CL 110 103 101  CO2 21* 23 17*  GLUCOSE 80 74 63*  BUN 5* <5* <5*  CREATININE 0.55 0.54 0.69  CALCIUM 8.5* 8.9 9.3   LFTs Recent Labs    08/15/17 1351 08/16/17 0414 08/17/17 0545  BILITOT 0.5 0.8 1.2  ALKPHOS 185* 171* 196*  AST 18 16 13*  ALT 15 15 15   PROT 6.7 6.6 7.1  ALBUMIN 3.8 3.6 3.9   Recent Labs    08/14/17 2332 08/15/17 1351 08/16/17 0414  LIPASE 1,010* 972* 115*   PT/INR No results for input(s): LABPROT, INR in the last 72 hours.    Imaging Studies: Ct Abdomen Pelvis W Contrast  Result Date: 08/16/2017 CLINICAL DATA:  Acute epigastric abdominal  pain. EXAM: CT ABDOMEN AND PELVIS WITH CONTRAST TECHNIQUE: Multidetector CT imaging of the abdomen and pelvis was performed using the standard protocol following bolus administration of intravenous contrast. CONTRAST:  48m ISOVUE-300 IOPAMIDOL (ISOVUE-300) INJECTION 61% orally, 1077mISOVUE-300 IOPAMIDOL (ISOVUE-300) INJECTION 61% intravenously. COMPARISON:  CT scan of May 25, 2017. FINDINGS: Lower chest: No acute abnormality. Hepatobiliary: No focal liver abnormality is seen. No gallstones, gallbladder wall thickening, or biliary dilatation. Pancreas: Pancreatic calcifications are again noted suggesting chronic pancreatitis. There is interval development of ductal dilatation which may be due to 7 mm calculus. No surrounding inflammation is noted. Spleen: Normal in size without focal abnormality. Adrenals/Urinary Tract: Adrenal glands are unremarkable. Kidneys are normal, without renal calculi, focal lesion, or hydronephrosis. Bladder is unremarkable. Stomach/Bowel: The stomach appears normal. The appendix is not clearly visualized. There is no evidence of bowel obstruction or inflammation. Vascular/Lymphatic: No significant vascular findings are present. No enlarged abdominal or pelvic lymph nodes. Reproductive: Uterus and bilateral adnexa are unremarkable. Other: Mild amount of free fluid is noted in dependent portion of pelvis which most likely is physiologic. Musculoskeletal: No acute or significant osseous findings. IMPRESSION: Pancreatic calcifications are again noted suggesting chronic pancreatitis. There is interval development of pancreatic ductal dilatation which may be due to 7 mm calculus within the duct. MRCP may be performed for further evaluation. There is no evidence of acute pancreatic inflammation. Mild amount of free  fluid is noted in the pelvis which most likely is physiologic. Electronically Signed   By: Marijo Conception, M.D.   On: 08/16/2017 20:37   US Abdomen Limited Ruq  Result Date:  08/16/2017 CLINICAL DATA:  One day history of abdominal pain and elevated liver function studies. History of gastroesophageal reflux, pancreatitis. EXAM: ULTRASOUND ABDOMEN LIMITED RIGHT UPPER QUADRANT COMPARISON:  Abdominal and pelvic CT scan of May 25, 2017 and abdominal ultrasound of April 05, 2017. FINDINGS: Gallbladder: The gallbladder is adequately distended. There is echogenic bile or sludge present. No stones or polyps are observed. The gallbladder wall exhibits no abnormal thickening. There is no pericholecystic fluid. There is no positive sonographic Murphy's sign. Common bile duct: Diameter: 1.9 mm Liver: The hepatic echotexture is normal. The surface contour is smooth. There is no focal mass nor ductal dilation. Portal vein is patent on color Doppler imaging with normal direction of blood flow towards the liver. IMPRESSION: Gallbladder sludge. No stones or sonographic evidence of acute cholecystitis. Normal appearance of the liver and common bile duct. Electronically Signed   By: David  Martinique M.D.   On: 08/16/2017 10:10  [2 weeks]   Assessment: 32 year old female last seen years ago and has been lost to follow-up, presenting with acute on chronic pancreatitis. Lipase greater than 1000 on admission. Multiple images on file in the system here, [facility], and at [facility], with most recent imaging prior to admission being in Feb 2019 at [facility] with chronic pancreatitis. US abdomen Dec 2018 without evidence for gallstones. EUS in Jan 2015 at [facility] with atrophic pancreas and sequela of prior pancreatitis episodes. At one point, she was felt to have pancreas divisum or a variant thereof, but subsequent evaluation did not find this. Denies alcohol, does endorse occasional marijuana, and continues to smoke but has decreased this recently.   She is requiring IV Dilaudid basically every two hours. Nausea persistent but she wants to try liquids. Feels nausea worsens abdominal pain. And feels like  empty stomach makes everything worse.   Bump in alk phos to 198 noted on admission, new finding. No elevation during her 05/2017 admission. Transaminases and bilirubin remaining normal. RUQ ultrasound yesterday with gallbladder sludge. CT A/P with contrast yesterday with NEW pancreatic duct dilation possibly due to 3m pancreatic duct stone, findings suggestive of chronic pancreatitis with pancreatic calcifications noted, no acute inflammation seen.   Need to document H.pylori eradication via breath test (H.pylori on EGD in 2013 without known if eradicated) as outpatient but would need to hold PPI X 2 WEEKS prior.  Plan: 1. Continue supportive measures.  2. Consider clear liquids today. 3. [facility] Dr[facility] F[facility]ida [facility]. We will make arrangements. If patient does not improve, we may need to consider MRCP during admission.  4. Will continue to follow with you.   LLaureen Ochs LBernarda CaffeyRLifecare [facility]al [facility] Hospital[facility] [phone] AM     LOS: 2 days

## 2017-08-17 NOTE — Telephone Encounter (Signed)
Called [facility] for referral. Secretary will send referral to scheduling coordinator d/t pt having Medicaid. They will contact our office with appt.

## 2017-08-17 NOTE — Progress Notes (Signed)
PROGRESS NOTE  [name] NWG:[id number] DOB: [birth year] DOA: 08/14/2017 PCP: Patient, No Pcp Per  Brief History:  32 year old female with a history of tobacco abuse, marijuana use, chronic/recurrent pancreatitis, chronic abdominal pain, depression, opioid dependence presenting with 1 week history of epigastric pain that significantly worsened on 08/14/2017 with associated nausea and vomiting.  The patient denies taking any new medications.  She was recently admitted to the hospital from 06/13/2017 through 06/16/2017 for acute pancreatitis.  The patient previously followed GI, Dr. Darrick Penna,, but she appears to have been lost to follow-up.  She last saw Dr. Chestine Spore at [facility] on 05/11/2013 at which time she had an endoscopic ultrasound.  The endoscopic ultrasound revealed an atrophic pancreas with scattered hypoechoic stranding and foci likely sequelae of prior pancreatitis.  MRCP on 03/06/2012 was negative for pancreatic divisum. The patient denies any alcohol use, but states that she has used marijuana.  She denies any illegal drug use.  She smokes about 4 to 5 cigarettes/day.  She denies any fevers, chills, chest pain, shortness breath, dysuria, hematuria, hematochezia, melena.  She has had some loose stools.  Upon admission, the patient was noted to have a serum lipase of 1010 with WBC 11.0 and normal AST, ALT.  GI was consulted to assist with management.  Assessment/Plan: Acute on chronic pancreatitis -Presented with serum lipase 1010>>>115 -pain is out of proportion with objective exam and lab findings -please avoid giving additional opioids in absence of objective evidence causing increased pain -Continue bowel rest, n.p.o. except for ice chips -Continue IV fluids -Judicious pain control--receiving IV hydromorphone around the clock -4/30 abd US--gallbladder sludge.  No cholecystitis.  No cholelithiasis -CT abdomen pelvis shows chronic pancreatitis with interval ductal dilatation.  No  biliary dilatation.  Recommendations per GI order for outpatient follow-up at [facility] in the next 4 to 6 weeks for probable EUS/ERCP -Advance diet to clear liquids   Chronic pain syndrome -The [facility] Controlled Substance Reporting System has been queried for this patient for the past 12 months -She received Percocet 5/25 on 06/16/2017, #15 -She received oxycodone 10 mg on 05/17/2017, #90 -Prior to the above prescriptions, she had not had any opioid filled since 12/14/2016 -pt claims that she is "stretching out" her oxycodone   Depression -Continue Celexa  Moderate malnutrition -continue supplements   Disposition Plan:   Home in 2-3 days  Family Communication:  No Family at bedside   Consultants:  GI  Code Status:  FULL   DVT Prophylaxis:  [place] Heparin    Procedures: As Listed in Progress Note Above  Antibiotics: None      Subjective: Overall she is feeling better today.  Had some vomiting after having liquids, but feels that she may have drank liquids too quickly.  Objective: Vitals:   08/16/17 2016 08/16/17 2113 08/17/17 0449 08/17/17 1324  BP: (!) 145/101  (!) 126/109 (!) 137/112  Pulse: (!) 101  (!) 118 (!) 103  Resp: 20   17  Temp: 98.7 F (37.1 C)  99 F (37.2 C) 98.3 F (36.8 C)  TempSrc: Oral  Oral Oral  SpO2: 96% 99% 98% 99%  Weight:      Height:        Intake/Output Summary (Last 24 hours) at 08/17/2017 1910 Last data filed at 08/17/2017 1819 Gross per 24 hour  Intake 1910 ml  Output -  Net 1910 ml   Weight change:  Exam:  General exam: Alert,  awake, oriented x 3 Respiratory system: Clear to auscultation. Respiratory effort normal. Cardiovascular system:RRR. No murmurs, rubs, gallops. Gastrointestinal system: Abdomen is nondistended, soft and mild tenderness in epigastrium. No organomegaly or masses felt. Normal bowel sounds heard. Central nervous system: Alert and oriented. No focal neurological  deficits. Extremities: No C/C/E, +pedal pulses Skin: No rashes, lesions or ulcers  Psychiatry: Judgement and insight appear normal. Mood & affect appropriate.   Data Reviewed: I have personally reviewed following labs and imaging studies Basic Metabolic Panel: Recent Labs  Lab 08/14/17 2332 08/15/17 1351 08/16/17 0414 08/17/17 0545  NA 140 140 138 134*  K 3.6 3.5 3.6 4.3  CL 105 110 103 101  CO2 25 21* 23 17*  GLUCOSE 109* 80 74 63*  BUN 14 5* <5* <5*  CREATININE 0.57 0.55 0.54 0.69  CALCIUM 9.5 8.5* 8.9 9.3   Liver Function Tests: Recent Labs  Lab 08/14/17 2332 08/15/17 1351 08/16/17 0414 08/17/17 0545  AST 14* 18 16 13*  ALT ALKPHOS 198* 185* 171* 196*  BILITOT 0.7 0.5 0.8 1.2  PROT 7.2 6.7 6.6 7.1  ALBUMIN 4.0 3.8 3.6 3.9   Recent Labs  Lab 08/14/17 2332 08/15/17 1351 08/16/17 0414  LIPASE 1,010* 972* 115*   No results for input(s): AMMONIA in the last 168 hours. Coagulation Profile: No results for input(s): INR, PROTIME in the last 168 hours. CBC: Recent Labs  Lab 08/14/17 2332 08/15/17 1351 08/16/17 0414  WBC 11.0* 9.7 8.0  NEUTROABS 7.2 5.5  --   HGB 12.4 11.5* 11.4*  HCT 37.9 35.9* 34.6*  MCV 90.9 92.8 92.5  PLT 240 244 225   Cardiac Enzymes: No results for input(s): CKTOTAL, CKMB, CKMBINDEX, TROPONINI in the last 168 hours. BNP: Invalid input(s): POCBNP CBG: No results for input(s): GLUCAP in the last 168 hours. HbA1C: No results for input(s): HGBA1C in the last 72 hours. Urine analysis:    Component Value Date/Time   COLORURINE YELLOW 08/15/2017 0225   APPEARANCEUR CLEAR 08/15/2017 0225   LABSPEC 1.013 08/15/2017 0225   PHURINE 6.0 08/15/2017 0225   GLUCOSEU NEGATIVE 08/15/2017 0225   HGBUR NEGATIVE 08/15/2017 0225   BILIRUBINUR NEGATIVE 08/15/2017 0225   KETONESUR NEGATIVE 08/15/2017 0225   PROTEINUR NEGATIVE 08/15/2017 0225   UROBILINOGEN 0.2 02/09/2015 1109   NITRITE NEGATIVE 08/15/2017 0225   LEUKOCYTESUR  NEGATIVE 08/15/2017 0225   Sepsis Labs: (procalcitonin:4,lacticidven:4) )No results found for this or any previous visit (from the past 240 hour(s)).   Scheduled Meds: . enoxaparin (LOVENOX) injection  40 mg Subcutaneous Q24H  . feeding supplement  1 Container Oral BID BM  . nicotine  7 mg Transdermal Daily  . ondansetron (ZOFRAN) IV  4 mg Intravenous QID  . pantoprazole  40 mg Oral BID AC   Continuous Infusions: . lactated ringers with kcl 100 mL/hr at 08/17/17 6213    Procedures/Studies: Ct Abdomen Pelvis W Contrast  Result Date: 08/16/2017 CLINICAL DATA:  Acute epigastric abdominal pain. EXAM: CT ABDOMEN AND PELVIS WITH CONTRAST TECHNIQUE: Multidetector CT imaging of the abdomen and pelvis was performed using the standard protocol following bolus administration of intravenous contrast. CONTRAST:  30mL ISOVUE-300 IOPAMIDOL (ISOVUE-300) INJECTION 61% orally, ISOVUE-300 IOPAMIDOL (ISOVUE-300) INJECTION 61% intravenously. COMPARISON:  CT scan of May 25, 2017. FINDINGS: Lower chest: No acute abnormality. Hepatobiliary: No focal liver abnormality is seen. No gallstones, gallbladder wall thickening, or biliary dilatation. Pancreas: Pancreatic calcifications are again noted suggesting chronic pancreatitis. There is interval development of ductal  dilatation which may be due to 7 mm calculus. No surrounding inflammation is noted. Spleen: Normal in size without focal abnormality. Adrenals/Urinary Tract: Adrenal glands are unremarkable. Kidneys are normal, without renal calculi, focal lesion, or hydronephrosis. Bladder is unremarkable. Stomach/Bowel: The stomach appears normal. The appendix is not clearly visualized. There is no evidence of bowel obstruction or inflammation. Vascular/Lymphatic: No significant vascular findings are present. No enlarged abdominal or pelvic lymph nodes. Reproductive: Uterus and bilateral adnexa are unremarkable. Other: Mild amount of free fluid is noted  in dependent portion of pelvis which most likely is physiologic. Musculoskeletal: No acute or significant osseous findings. IMPRESSION: Pancreatic calcifications are again noted suggesting chronic pancreatitis. There is interval development of pancreatic ductal dilatation which may be due to 7 mm calculus within the duct. MRCP may be performed for further evaluation. There is no evidence of acute pancreatic inflammation. Mild amount of free fluid is noted in the pelvis which most likely is physiologic. Electronically Signed   By: Lupita Raider, M.D.   On: 08/16/2017 20:37   US Abdomen Limited Ruq  Result Date: 08/16/2017 CLINICAL DATA:  One day history of abdominal pain and elevated liver function studies. History of gastroesophageal reflux, pancreatitis. EXAM: ULTRASOUND ABDOMEN LIMITED RIGHT UPPER QUADRANT COMPARISON:  Abdominal and pelvic CT scan of May 25, 2017 and abdominal ultrasound of April 05, 2017. FINDINGS: Gallbladder: The gallbladder is adequately distended. There is echogenic bile or sludge present. No stones or polyps are observed. The gallbladder wall exhibits no abnormal thickening. There is no pericholecystic fluid. There is no positive sonographic Murphy's sign. Common bile duct: Diameter: 1.9 mm Liver: The hepatic echotexture is normal. The surface contour is smooth. There is no focal mass nor ductal dilation. Portal vein is patent on color Doppler imaging with normal direction of blood flow towards the liver. IMPRESSION: Gallbladder sludge. No stones or sonographic evidence of acute cholecystitis. Normal appearance of the liver and common bile duct. Electronically Signed   By: David  [place] M.D.   On: 08/16/2017 10:10    Erick Blinks, MD  [facility] Pager [phone]  If 7PM-7AM, please contact night-coverage [email or web address] Password [email or web address] 08/17/2017, 7:10 PM   LOS: 2 days

## 2017-08-18 DIAGNOSIS — E440 Moderate protein-calorie malnutrition: Secondary | ICD-10-CM

## 2017-08-18 DIAGNOSIS — F418 Other specified anxiety disorders: Secondary | ICD-10-CM

## 2017-08-18 DIAGNOSIS — R1013 Epigastric pain: Secondary | ICD-10-CM

## 2017-08-18 DIAGNOSIS — G894 Chronic pain syndrome: Secondary | ICD-10-CM

## 2017-08-18 LAB — BASIC METABOLIC PANEL
Anion gap: 15 (ref 5–15)
CHLORIDE: 101 mmol/L (ref 101–111)
CO2: 19 mmol/L — ABNORMAL LOW (ref 22–32)
Calcium: 9.6 mg/dL (ref 8.9–10.3)
Creatinine, Ser: 0.8 mg/dL (ref 0.44–1.00)
GFR calc Af Amer: >60 mL/min (ref >60)
GFR calc non Af Amer: >60 mL/min (ref >60)
Glucose, Bld: 101 mg/dL — ABNORMAL HIGH (ref 65–99)
POTASSIUM: 4.5 mmol/L (ref 3.5–5.1)
SODIUM: 135 mmol/L (ref 135–145)

## 2017-08-18 NOTE — Progress Notes (Addendum)
Subjective:  Tolerating clear liquids without increased pain. +flatus. Persistent epig pain.  Objective: Vital signs in last 24 hours: Temp:  [98.3 F (36.8 C)-98.9 F (37.2 C)] 98.9 F (37.2 C) (05/02 0631) Pulse Rate:  [103-128] 128 (05/02 0631) Resp:  [17-20] 20 (05/01 2139) BP: (117-149)/(80-112) 149/94 (05/02 0631) SpO2:  [96 %-99 %] 99 % (05/02 0855) Last BM Date: 08/15/17 General:   Alert,  Well-developed, well-nourished, pleasant and cooperative in NAD Head:  Normocephalic and atraumatic. Eyes:  Sclera clear, no icterus.  Abdomen:  Soft, moderate epigastric tenderness and nondistended. Normal bowel sounds, without guarding, and without rebound.   Extremities:  Without clubbing, deformity or edema. Neurologic:  Alert and  oriented x4;  grossly normal neurologically. Skin:  Intact without significant lesions or rashes. Psych:  Alert and cooperative. Normal mood and affect.  Intake/Output from previous day: 05/01 0701 - 05/02 0700 In: 910 [P.O.:910] Out: -  Intake/Output this shift: No intake/output data recorded.  Lab Results: CBC Recent Labs    08/15/17 1351 08/16/17 0414  WBC 9.7 8.0  HGB 11.5* 11.4*  HCT 35.9* 34.6*  MCV 92.8 92.5  PLT 244 225   BMET Recent Labs    08/16/17 0414 08/17/17 0545 08/18/17 0634  NA 138 134* 135  K 3.6 4.3 4.5  CL 103 101 101  CO2 23 17* 19*  GLUCOSE 74 63* 101*  BUN <5* <5* <5*  CREATININE 0.54 0.69 0.80  CALCIUM 8.9 9.3 9.6   LFTs Recent Labs    08/15/17 1351 08/16/17 0414 08/17/17 0545  BILITOT 0.5 0.8 1.2  ALKPHOS 185* 171* 196*  AST 18 16 13*  ALT 15 15 15   PROT 6.7 6.6 7.1  ALBUMIN 3.8 3.6 3.9   Recent Labs    08/15/17 1351 08/16/17 0414  LIPASE 972* 115*   PT/INR No results for input(s): LABPROT, INR in the last 72 hours.    Imaging Studies: Ct Abdomen Pelvis W Contrast  Result Date: 08/16/2017 CLINICAL DATA:  Acute epigastric abdominal pain. EXAM: CT ABDOMEN AND PELVIS WITH CONTRAST  TECHNIQUE: Multidetector CT imaging of the abdomen and pelvis was performed using the standard protocol following bolus administration of intravenous contrast. CONTRAST:  65m ISOVUE-300 IOPAMIDOL (ISOVUE-300) INJECTION 61% orally, 1068mISOVUE-300 IOPAMIDOL (ISOVUE-300) INJECTION 61% intravenously. COMPARISON:  CT scan of May 25, 2017. FINDINGS: Lower chest: No acute abnormality. Hepatobiliary: No focal liver abnormality is seen. No gallstones, gallbladder wall thickening, or biliary dilatation. Pancreas: Pancreatic calcifications are again noted suggesting chronic pancreatitis. There is interval development of ductal dilatation which may be due to 7 mm calculus. No surrounding inflammation is noted. Spleen: Normal in size without focal abnormality. Adrenals/Urinary Tract: Adrenal glands are unremarkable. Kidneys are normal, without renal calculi, focal lesion, or hydronephrosis. Bladder is unremarkable. Stomach/Bowel: The stomach appears normal. The appendix is not clearly visualized. There is no evidence of bowel obstruction or inflammation. Vascular/Lymphatic: No significant vascular findings are present. No enlarged abdominal or pelvic lymph nodes. Reproductive: Uterus and bilateral adnexa are unremarkable. Other: Mild amount of free fluid is noted in dependent portion of pelvis which most likely is physiologic. Musculoskeletal: No acute or significant osseous findings. IMPRESSION: Pancreatic calcifications are again noted suggesting chronic pancreatitis. There is interval development of pancreatic ductal dilatation which may be due to 7 mm calculus within the duct. MRCP may be performed for further evaluation. There is no evidence of acute pancreatic inflammation. Mild amount of free fluid is noted in the pelvis which most likely is physiologic.  Electronically Signed   By: Marijo Conception, M.D.   On: 08/16/2017 20:37   US Abdomen Limited Ruq  Result Date: 08/16/2017 CLINICAL DATA:  One day history of  abdominal pain and elevated liver function studies. History of gastroesophageal reflux, pancreatitis. EXAM: ULTRASOUND ABDOMEN LIMITED RIGHT UPPER QUADRANT COMPARISON:  Abdominal and pelvic CT scan of May 25, 2017 and abdominal ultrasound of April 05, 2017. FINDINGS: Gallbladder: The gallbladder is adequately distended. There is echogenic bile or sludge present. No stones or polyps are observed. The gallbladder wall exhibits no abnormal thickening. There is no pericholecystic fluid. There is no positive sonographic Murphy's sign. Common bile duct: Diameter: 1.9 mm Liver: The hepatic echotexture is normal. The surface contour is smooth. There is no focal mass nor ductal dilation. Portal vein is patent on color Doppler imaging with normal direction of blood flow towards the liver. IMPRESSION: Gallbladder sludge. No stones or sonographic evidence of acute cholecystitis. Normal appearance of the liver and common bile duct. Electronically Signed   By: David  Martinique M.D.   On: 08/16/2017 10:10  [2 weeks]   Assessment: 32 year old female last seen years ago and has been lost to follow-up, presenting with acute on chronic pancreatitis. Lipase greater than 1000 on admission. Multiple images on file in the system here, [place], and at [facility], with most recent imaging prior to admission being [facility] Feb 2019 [facility] with chronic pancreatitis. US abdomen Dec 2018 without evidence for gallstones. EUS in Jan 2015 at [facility] with atrophic pancreas and sequela of prior pancreatitis episodes. At one point, she was felt to have pancreas divisum or a variant thereof, but subsequent evaluation did not find this. Denies alcohol,doesendorse occasional marijuana, and continues to smoke but has decreased this recently.  She is requiring IV Dilaudid basically every two hours. tolerated liquids. Slowly improved. Still with slight tachycardia last 24 hours possible multifactorial. Her renal function is normal,  electrolytes normal. No significant inflammation seen on CT this admission and lipase improved.   Bump in alk phos to 198 noted on admission, new finding. No elevation during her 05/2017 admission. Transaminases and bilirubin remaining normal. RUQ ultrasound yesterday with gallbladder sludge. CT A/P with contrast yesterday with NEW pancreatic duct dilation possibly due to 38m pancreatic duct stone, findings suggestive of chronic pancreatitis with pancreatic calcifications noted, no acute inflammation seen.   Need to document H.pylori eradication via breath test (H.pylori on EGD in 2013 without known if eradicated) as outpatient but would need to hold PPI X 2WEEKSprior.    Plan: 1. EUS/ERCP in 4-6 weeks. Awaiting appointment information.  2. Continue clear liquids. 3. Continue supportive measures.  4. Recheck LFTs/lipase in am. 5. Will continue to follow with you.   LLaureen Ochs LBernarda CaffeyRProvidence Mount Carmel HospitalGastroenterology Associates [id number] AM     LOS: 3 days

## 2017-08-18 NOTE — Progress Notes (Signed)
PROGRESS NOTE  [name] UUV:[id number] DOB: 14-Apr-1986 DOA: 08/14/2017 PCP: Patient, No Pcp Per  Brief History:  32 year old female with a history of tobacco abuse, marijuana use, chronic/recurrent pancreatitis, chronic abdominal pain, depression, opioid dependence presenting with 1 week history of epigastric pain that significantly worsened on 08/14/2017 with associated nausea and vomiting.  The patient denies taking any new medications.  She was recently admitted to the hospital from 06/13/2017 through 06/16/2017 for acute pancreatitis.  The patient previously followed GI, Dr. Darrick Penna,, but she appears to have been lost to follow-up.  She last saw Dr. Chestine Spore at [facility] on 05/11/2013 at which time she had an endoscopic ultrasound.  The endoscopic ultrasound revealed an atrophic pancreas with scattered hypoechoic stranding and foci likely sequelae of prior pancreatitis.  MRCP on 03/06/2012 was negative for pancreatic divisum. The patient denies any alcohol use, but states that she has used marijuana.  She denies any illegal drug use.  She smokes about 4 to 5 cigarettes/day.  She denies any fevers, chills, chest pain, shortness breath, dysuria, hematuria, hematochezia, melena.  She has had some loose stools.  Upon admission, the patient was noted to have a serum lipase of 1010 with WBC 11.0 and normal AST, ALT.  GI was consulted to assist with management.  Assessment/Plan: Acute on chronic pancreatitis -Presented with serum lipase 1010>>>115 -pain is out of proportion with objective exam and lab findings -please avoid giving additional opioids in absence of objective evidence causing increased pain -Continue IV fluids -Judicious pain control--receiving IV hydromorphone around the clock -4/30 abd US--gallbladder sludge.  No cholecystitis.  No cholelithiasis -CT abdomen pelvis shows chronic pancreatitis with interval ductal dilatation.  No biliary dilatation.  Recommendations per GI order for  outpatient follow-up at [facility] in the next 4 to 6 weeks for probable EUS/ERCP -Advance diet to full liquids.  Repeat labs in a.m.   Chronic pain syndrome -The [place] Controlled Substance Reporting System has been queried for this patient for the past 12 months -She received Percocet 5/25 on 06/16/2017, #15 -She received oxycodone 10 mg on 05/17/2017, #90 -Prior to the above prescriptions, she had not had any opioid filled since 12/14/2016 -pt claims that she is "stretching out" her oxycodone   Depression -Continue Celexa  Moderate malnutrition -continue supplements   Disposition Plan:   Home in 2-3 days  Family Communication:  No Family at bedside   Consultants:  GI  Code Status:  FULL   DVT Prophylaxis:  [place] Heparin    Procedures: As Listed in Progress Note Above  Antibiotics: None      Subjective: Continues to feel better.  Tolerating clear liquids.  No significant vomiting.  Objective: Vitals:   08/17/17 2139 08/18/17 0631 08/18/17 0855 08/18/17 1424  BP: 117/80 (!) 149/94  132/82  Pulse: (!) 107 (!) 128  (!) 118  Resp: 20   20  Temp: 98.3 F (36.8 C) 98.9 F (37.2 C)  98.9 F (37.2 C)  TempSrc: Oral Oral  Oral  SpO2: 96% 97% 99% 97%  Weight:      Height:        Intake/Output Summary (Last 24 hours) at 08/18/2017 1741 Last data filed at 08/18/2017 1205 Gross per 24 hour  Intake 1390 ml  Output -  Net 1390 ml   Weight change:  Exam:  General exam: Alert, awake, oriented x 3 Respiratory system: Clear to auscultation. Respiratory effort normal. Cardiovascular system: Regular, tachycardic.  No murmurs, rubs, gallops. Gastrointestinal system: Abdomen is nondistended, soft and tender in epigastrium. No organomegaly or masses felt. Normal bowel sounds heard. Central nervous system: Alert and oriented. No focal neurological deficits. Extremities: No C/C/E, +pedal pulses Skin: No rashes, lesions or ulcers  Psychiatry: Judgement  and insight appear normal. Mood & affect appropriate.     Data Reviewed: I have personally reviewed following labs and imaging studies Basic Metabolic Panel: Recent Labs  Lab 08/14/17 2332 08/15/17 1351 08/16/17 0414 08/17/17 0545 08/18/17 0634  NA 140 140 138 134* 135  K 3.6 3.5 3.6 4.3 4.5  CL 105 110 103 101 101  CO2 25 21* 23 17* 19*  GLUCOSE 109* 80 74 63* 101*  BUN 14 5* <5* <5* <5*  CREATININE 0.57 0.55 0.54 0.69 0.80  CALCIUM 9.5 8.5* 8.9 9.3 9.6   Liver Function Tests: Recent Labs  Lab 08/14/17 2332 08/15/17 1351 08/16/17 0414 08/17/17 0545  AST 14* 18 16 13*  ALT ALKPHOS 198* 185* 171* 196*  BILITOT 0.7 0.5 0.8 1.2  PROT 7.2 6.7 6.6 7.1  ALBUMIN 4.0 3.8 3.6 3.9   Recent Labs  Lab 08/14/17 2332 08/15/17 1351 08/16/17 0414  LIPASE 1,010* 972* 115*   No results for input(s): AMMONIA in the last 168 hours. Coagulation Profile: No results for input(s): INR, PROTIME in the last 168 hours. CBC: Recent Labs  Lab 08/14/17 2332 08/15/17 1351 08/16/17 0414  WBC 11.0* 9.7 8.0  NEUTROABS 7.2 5.5  --   HGB 12.4 11.5* 11.4*  HCT 37.9 35.9* 34.6*  MCV 90.9 92.8 92.5  PLT 240 244 225   Cardiac Enzymes: No results for input(s): CKTOTAL, CKMB, CKMBINDEX, TROPONINI in the last 168 hours. BNP: Invalid input(s): POCBNP CBG: No results for input(s): GLUCAP in the last 168 hours. HbA1C: No results for input(s): HGBA1C in the last 72 hours. Urine analysis:    Component Value Date/Time   COLORURINE YELLOW 08/15/2017 0225   APPEARANCEUR CLEAR 08/15/2017 0225   LABSPEC 1.013 08/15/2017 0225   PHURINE 6.0 08/15/2017 0225   GLUCOSEU NEGATIVE 08/15/2017 0225   HGBUR NEGATIVE 08/15/2017 0225   BILIRUBINUR NEGATIVE 08/15/2017 0225   KETONESUR NEGATIVE 08/15/2017 0225   PROTEINUR NEGATIVE 08/15/2017 0225   UROBILINOGEN 0.2 02/09/2015 1109   NITRITE NEGATIVE 08/15/2017 0225   LEUKOCYTESUR NEGATIVE 08/15/2017 0225   Sepsis  Labs: (procalcitonin:4,lacticidven:4) )No results found for this or any previous visit (from the past 240 hour(s)).   Scheduled Meds: . enoxaparin (LOVENOX) injection  40 mg Subcutaneous Q24H  . feeding supplement  1 Container Oral BID BM  . nicotine  7 mg Transdermal Daily  . ondansetron (ZOFRAN) IV  4 mg Intravenous QID  . pantoprazole  40 mg Oral BID AC   Continuous Infusions: . lactated ringers with kcl 100 mL/hr at 08/17/17 4098    Procedures/Studies: Ct Abdomen Pelvis W Contrast  Result Date: 08/16/2017 CLINICAL DATA:  Acute epigastric abdominal pain. EXAM: CT ABDOMEN AND PELVIS WITH CONTRAST TECHNIQUE: Multidetector CT imaging of the abdomen and pelvis was performed using the standard protocol following bolus administration of intravenous contrast. CONTRAST:  30mL ISOVUE-300 IOPAMIDOL (ISOVUE-300) INJECTION 61% orally, ISOVUE-300 IOPAMIDOL (ISOVUE-300) INJECTION 61% intravenously. COMPARISON:  CT scan of May 25, 2017. FINDINGS: Lower chest: No acute abnormality. Hepatobiliary: No focal liver abnormality is seen. No gallstones, gallbladder wall thickening, or biliary dilatation. Pancreas: Pancreatic calcifications are again noted suggesting chronic pancreatitis. There is interval development of ductal dilatation which may  be due to 7 mm calculus. No surrounding inflammation is noted. Spleen: Normal in size without focal abnormality. Adrenals/Urinary Tract: Adrenal glands are unremarkable. Kidneys are normal, without renal calculi, focal lesion, or hydronephrosis. Bladder is unremarkable. Stomach/Bowel: The stomach appears normal. The appendix is not clearly visualized. There is no evidence of bowel obstruction or inflammation. Vascular/Lymphatic: No significant vascular findings are present. No enlarged abdominal or pelvic lymph nodes. Reproductive: Uterus and bilateral adnexa are unremarkable. Other: Mild amount of free fluid is noted in dependent portion of pelvis which  most likely is physiologic. Musculoskeletal: No acute or significant osseous findings. IMPRESSION: Pancreatic calcifications are again noted suggesting chronic pancreatitis. There is interval development of pancreatic ductal dilatation which may be due to 7 mm calculus within the duct. MRCP may be performed for further evaluation. There is no evidence of acute pancreatic inflammation. Mild amount of free fluid is noted in the pelvis which most likely is physiologic. Electronically Signed   By: Lupita Raider, M.D.   On: 08/16/2017 20:37   US Abdomen Limited Ruq  Result Date: 08/16/2017 CLINICAL DATA:  One day history of abdominal pain and elevated liver function studies. History of gastroesophageal reflux, pancreatitis. EXAM: ULTRASOUND ABDOMEN LIMITED RIGHT UPPER QUADRANT COMPARISON:  Abdominal and pelvic CT scan of May 25, 2017 and abdominal ultrasound of April 05, 2017. FINDINGS: Gallbladder: The gallbladder is adequately distended. There is echogenic bile or sludge present. No stones or polyps are observed. The gallbladder wall exhibits no abnormal thickening. There is no pericholecystic fluid. There is no positive sonographic Murphy's sign. Common bile duct: Diameter: 1.9 mm Liver: The hepatic echotexture is normal. The surface contour is smooth. There is no focal mass nor ductal dilation. Portal vein is patent on color Doppler imaging with normal direction of blood flow towards the liver. IMPRESSION: Gallbladder sludge. No stones or sonographic evidence of acute cholecystitis. Normal appearance of the liver and common bile duct. Electronically Signed   By: David  [place] M.D.   On: 08/16/2017 10:10    Erick Blinks, MD  [facility] Pager [phone]  If 7PM-7AM, please contact night-coverage [email or web address] Password [email or web address] 08/18/2017, 5:41 PM   LOS: 3 days

## 2017-08-19 LAB — LIPASE, BLOOD: Lipase: 26 U/L (ref 11–51)

## 2017-08-19 LAB — HEPATIC FUNCTION PANEL
ALK PHOS: 155 U/L — AB (ref 38–126)
ALT: 12 U/L — AB (ref 14–54)
AST: 11 U/L — AB (ref 15–41)
Albumin: 3.5 g/dL (ref 3.5–5.0)
BILIRUBIN DIRECT: 0.2 mg/dL (ref 0.1–0.5)
BILIRUBIN TOTAL: 0.8 mg/dL (ref 0.3–1.2)
Indirect Bilirubin: 0.6 mg/dL (ref 0.3–0.9)
Total Protein: 7.2 g/dL (ref 6.5–8.1)

## 2017-08-19 MED ORDER — OXYCODONE HCL 5 MG PO TABS
15.0000 mg | ORAL_TABLET | ORAL | Status: DC | PRN
  Administered 2017-08-19: 15 mg via ORAL
  Filled 2017-08-19: qty 3

## 2017-08-19 MED ORDER — PANTOPRAZOLE SODIUM 40 MG PO TBEC: 40.0000 mg | DELAYED_RELEASE_TABLET | Freq: Every day | ORAL | 0 refills | Status: DC

## 2017-08-19 MED ORDER — OXYCODONE HCL 15 MG PO TABS: 15.0000 mg | ORAL_TABLET | Freq: Four times a day (QID) | ORAL | 0 refills | Status: DC | PRN

## 2017-08-19 MED ORDER — PROMETHAZINE HCL 25 MG PO TABS: 25.0000 mg | ORAL_TABLET | Freq: Four times a day (QID) | ORAL | 0 refills | Status: DC | PRN

## 2017-08-19 NOTE — Discharge Summary (Signed)
Physician Discharge Summary  ANALYSE ANGST ZOX:[id number] DOB: 06-Nov-1985 DOA: 08/14/2017  PCP: Patient, No Pcp Per  Admit date: 08/14/2017 Discharge date: 08/19/2017  Admitted From: Home Disposition: Home  Recommendations for Outpatient Follow-up:  1. Follow up with PCP in 1-2 weeks 2. Please obtain BMP/CBC in one week 3. Patient will be referred to [facility] for outpatient EUS/ERCP in the next 4 to 6 weeks  Home Health: Equipment/Devices:  Discharge Condition: Stable CODE STATUS: Full code Diet recommendation: Heart Healthy  Brief/Interim Summary: 32 year old female with a history of tobacco abuse, marijuana use, chronic/recurrent pancreatitis, chronic abdominal pain, depression, opioid dependence presenting with 1 week history of epigastric pain that significantly worsened on 08/14/2017 with associated nausea and vomiting. The patient denies taking any new medications. She was recently admitted to the hospital from 06/13/2017 through 06/16/2017 for acute pancreatitis. The patient previously followed GI, Dr. Darrick Penna,, but she appears to have been lost to follow-up. She last saw Dr. Chestine Spore at [facility] 05/11/2013 at which time she had an endoscopic ultrasound. The endoscopic ultrasound revealed an atrophic pancreas with scattered hypoechoic stranding and foci likely sequelae of prior pancreatitis. MRCP on 03/06/2012 was negative for pancreatic divisum. The patient denies any alcohol use, but states that she has used marijuana. She denies any illegal drug use. She smokes about 4 to 5 cigarettes/day. She denies any fevers, chills, chest pain, shortness breath, dysuria, hematuria, hematochezia, melena. She has had some loose stools. Upon admission, the patient was noted to have a serum lipase of 1010 with WBC 11.0 and normal AST, ALT. GI was consulted to assist with management.    Discharge Diagnoses:  Principal Problem:   Acute recurrent pancreatitis Active Problems:   Pancreas  divisum   Tobacco abuse   Depression with anxiety   Pancreatitis   Acute on chronic pancreatitis (HCC)   Chronic pain syndrome   Malnutrition of moderate degree   Elevated serum alkaline phosphatase level  Acute on chronic pancreatitis -Presented with serum lipase 1010>>>26 -Patient was treated with IV fluids, pain management and bowel rest.  Diet was slowly advanced and she is now tolerating a solid diet. -4/30 abd US--gallbladder sludge.  No cholecystitis.  No cholelithiasis -CT abdomen pelvis shows chronic pancreatitis with interval ductal dilatation.  No biliary dilatation.  Recommendations per GI order for outpatient follow-up at [facility] in the next 4 to 6 weeks for probable EUS/ERCP -Diet was advanced and she is tolerating a solid diet.  Pain appears to be reasonably controlled   Chronic pain syndrome -The [facility] Controlled Substance Reporting System has been queried for this patient for the past 12 months -She received Percocet 5/25 on 06/16/2017, #15 -She received oxycodone 10 mg on 05/17/2017, #90 -Prior to the above prescriptions, she had not had any opioidfilledsince 12/14/2016 -pt claims that she is "stretching out" her oxycodone   Depression -Continue Celexa  Moderate malnutrition -continue supplements    Discharge Instructions  Discharge Instructions    Diet - low sodium heart healthy   Complete by:  As directed    Increase activity slowly   Complete by:  As directed      Allergies as of 08/19/2017      Reactions   Bee Venom Anaphylaxis   Other Anaphylaxis, Other (See Comments)   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     Tylenol [acetaminophen] Other (See Comments)   Stomach irritation   Fentanyl    Chest heaviness   Morphine Itching   Toradol [  ketorolac Tromethamine] Other (See Comments)   Anxiety and burning in the stomach   Reglan [metoclopramide] Anxiety      Medication List    TAKE these medications   citalopram  20 MG tablet Commonly known as:  CELEXA Take 1 tablet (20 mg total) by mouth daily.   oxyCODONE 15 MG immediate release tablet Commonly known as:  ROXICODONE Take 1 tablet (15 mg total) by mouth every 6 (six) hours as needed for pain. What changed:  when to take this   pantoprazole 40 MG tablet Commonly known as:  PROTONIX Take 1 tablet (40 mg total) by mouth daily.   promethazine 25 MG tablet Commonly known as:  PHENERGAN Take 1 tablet (25 mg total) by mouth every 6 (six) hours as needed for nausea or vomiting.   zolpidem 10 MG tablet Commonly known as:  AMBIEN Take 10 mg by mouth at bedtime as needed for sleep.       Allergies  Allergen Reactions  . Bee Venom Anaphylaxis  . 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    . Tylenol [Acetaminophen] Other (See Comments)    Stomach irritation  . Fentanyl     Chest heaviness  . Morphine Itching  . Toradol [Ketorolac Tromethamine] Other (See Comments)    Anxiety and burning in the stomach  . Reglan [Metoclopramide] Anxiety    Consultations:  Gastroenterology   Procedures/Studies: Ct Abdomen Pelvis W Contrast  Result Date: 08/16/2017 CLINICAL DATA:  Acute epigastric abdominal pain. EXAM: CT ABDOMEN AND PELVIS WITH CONTRAST TECHNIQUE: Multidetector CT imaging of the abdomen and pelvis was performed using the standard protocol following bolus administration of intravenous contrast. CONTRAST:  30mL ISOVUE-300 IOPAMIDOL (ISOVUE-300) INJECTION 61% orally, ISOVUE-300 IOPAMIDOL (ISOVUE-300) INJECTION 61% intravenously. COMPARISON:  CT scan of May 25, 2017. FINDINGS: Lower chest: No acute abnormality. Hepatobiliary: No focal liver abnormality is seen. No gallstones, gallbladder wall thickening, or biliary dilatation. Pancreas: Pancreatic calcifications are again noted suggesting chronic pancreatitis. There is interval development of ductal dilatation which may be due to 7 mm  calculus. No surrounding inflammation is noted. Spleen: Normal in size without focal abnormality. Adrenals/Urinary Tract: Adrenal glands are unremarkable. Kidneys are normal, without renal calculi, focal lesion, or hydronephrosis. Bladder is unremarkable. Stomach/Bowel: The stomach appears normal. The appendix is not clearly visualized. There is no evidence of bowel obstruction or inflammation. Vascular/Lymphatic: No significant vascular findings are present. No enlarged abdominal or pelvic lymph nodes. Reproductive: Uterus and bilateral adnexa are unremarkable. Other: Mild amount of free fluid is noted in dependent portion of pelvis which most likely is physiologic. Musculoskeletal: No acute or significant osseous findings. IMPRESSION: Pancreatic calcifications are again noted suggesting chronic pancreatitis. There is interval development of pancreatic ductal dilatation which may be due to 7 mm calculus within the duct. MRCP may be performed for further evaluation. There is no evidence of acute pancreatic inflammation. Mild amount of free fluid is noted in the pelvis which most likely is physiologic. Electronically Signed   By: Lupita Raider, M.D.   On: 08/16/2017 20:37   US Abdomen Limited Ruq  Result Date: 08/16/2017 CLINICAL DATA:  One day history of abdominal pain and elevated liver function studies. History of gastroesophageal reflux, pancreatitis. EXAM: ULTRASOUND ABDOMEN LIMITED RIGHT UPPER QUADRANT COMPARISON:  Abdominal and pelvic CT scan of May 25, 2017 and abdominal ultrasound of April 05, 2017. FINDINGS: Gallbladder: The gallbladder is adequately distended. There is echogenic bile  or sludge present. No stones or polyps are observed. The gallbladder wall exhibits no abnormal thickening. There is no pericholecystic fluid. There is no positive sonographic Murphy's sign. Common bile duct: Diameter: 1.9 mm Liver: The hepatic echotexture is normal. The surface contour is smooth. There is no focal  mass nor ductal dilation. Portal vein is patent on color Doppler imaging with normal direction of blood flow towards the liver. IMPRESSION: Gallbladder sludge. No stones or sonographic evidence of acute cholecystitis. Normal appearance of the liver and common bile duct. Electronically Signed   By: David  [place] M.D.   On: 08/16/2017 10:10       Subjective: Feeling better today.  Tolerating solid diet.  No vomiting.  Pain appears to be reasonably controlled with medications.  Discharge Exam: Vitals:   08/19/17 0436 08/19/17 1410  BP: 98/69 105/77  Pulse: 96 (!) 103  Resp: 14 17  Temp: 98.5 F (36.9 C) 97.9 F (36.6 C)  SpO2: 97% 100%   Vitals:   08/18/17 1424 08/18/17 2053 08/19/17 0436 08/19/17 1410  BP: 132/82 118/77 98/69 105/77  Pulse: (!) 118 (!) 109 96 (!) 103  Resp: Temp: 98.9 F (37.2 C) 98.6 F (37 C) 98.5 F (36.9 C) 97.9 F (36.6 C)  TempSrc: Oral Oral Oral   SpO2: 97% 95% 97% 100%  Weight:      Height:        General: Pt is alert, awake, not in acute distress Cardiovascular: RRR, S1/S2 +, no rubs, no gallops Respiratory: CTA bilaterally, no wheezing, no rhonchi Abdominal: Tenderness in epigastrium Extremities: no edema, no cyanosis    The results of significant diagnostics from this hospitalization (including imaging, microbiology, ancillary and laboratory) are listed below for reference.     Microbiology: No results found for this or any previous visit (from the past 240 hour(s)).   Labs: BNP (last 3 results) No results for input(s): BNP in the last 8760 hours. Basic Metabolic Panel: Recent Labs  Lab 08/14/17 2332 08/15/17 1351 08/16/17 0414 08/17/17 0545 08/18/17 0634  NA 140 140 138 134* 135  K 3.6 3.5 3.6 4.3 4.5  CL 105 110 103 101 101  CO2 25 21* 23 17* 19*  GLUCOSE 109* 80 74 63* 101*  BUN 14 5* <5* <5* <5*  CREATININE 0.57 0.55 0.54 0.69 0.80  CALCIUM 9.5 8.5* 8.9 9.3 9.6   Liver Function Tests: Recent Labs  Lab  08/14/17 2332 08/15/17 1351 08/16/17 0414 08/17/17 0545 08/19/17 0456  AST 14* 18 16 13* 11*  ALT 12*  ALKPHOS 198* 185* 171* 196* 155*  BILITOT 0.7 0.5 0.8 1.2 0.8  PROT 7.2 6.7 6.6 7.1 7.2  ALBUMIN 4.0 3.8 3.6 3.9 3.5   Recent Labs  Lab 08/14/17 2332 08/15/17 1351 08/16/17 0414 08/19/17 0456  LIPASE 1,010* 972* 115* 26   No results for input(s): AMMONIA in the last 168 hours. CBC: Recent Labs  Lab 08/14/17 2332 08/15/17 1351 08/16/17 0414  WBC 11.0* 9.7 8.0  NEUTROABS 7.2 5.5  --   HGB 12.4 11.5* 11.4*  HCT 37.9 35.9* 34.6*  MCV 90.9 92.8 92.5  PLT 240 244 225   Cardiac Enzymes: No results for input(s): CKTOTAL, CKMB, CKMBINDEX, TROPONINI in the last 168 hours. BNP: Invalid input(s): POCBNP CBG: No results for input(s): GLUCAP in the last 168 hours. D-Dimer No results for input(s): DDIMER in the last 72 hours. Hgb A1c No results for input(s): HGBA1C in the  last 72 hours. Lipid Profile No results for input(s): CHOL, HDL, LDLCALC, TRIG, CHOLHDL, LDLDIRECT in the last 72 hours. Thyroid function studies No results for input(s): TSH, T4TOTAL, T3FREE, THYROIDAB in the last 72 hours.  Invalid input(s): FREET3 Anemia work up No results for input(s): VITAMINB12, FOLATE, FERRITIN, TIBC, IRON, RETICCTPCT in the last 72 hours. Urinalysis    Component Value Date/Time   COLORURINE YELLOW 08/15/2017 0225   APPEARANCEUR CLEAR 08/15/2017 0225   LABSPEC 1.013 08/15/2017 0225   PHURINE 6.0 08/15/2017 0225   GLUCOSEU NEGATIVE 08/15/2017 0225   HGBUR NEGATIVE 08/15/2017 0225   BILIRUBINUR NEGATIVE 08/15/2017 0225   KETONESUR NEGATIVE 08/15/2017 0225   PROTEINUR NEGATIVE 08/15/2017 0225   UROBILINOGEN 0.2 02/09/2015 1109   NITRITE NEGATIVE 08/15/2017 0225   LEUKOCYTESUR NEGATIVE 08/15/2017 0225   Sepsis Labs Invalid input(s): PROCALCITONIN,  WBC,  LACTICIDVEN Microbiology No results found for this or any previous visit (from the past 240  hour(s)).   Time coordinating discharge: 35 minutes SIGNED:   Erick Blinks, MD  [facility] 08/19/2017, 7:38 PM Pager   If 7PM-7AM, please contact night-coverage [email or web address] Password [email or web address]

## 2017-08-19 NOTE — Progress Notes (Signed)
Discharge instructions gone over with patient, verbalized understanding. IV removed, patient tolerated procedure well. Printed prescriptions given to patient. 

## 2017-08-22 ENCOUNTER — Telehealth: Payer: Self-pay | Admitting: Internal Medicine

## 2017-08-22 NOTE — Telephone Encounter (Signed)
Pt is a RMR pt. 

## 2017-08-22 NOTE — Telephone Encounter (Signed)
Called spoke with [facility] and was advised this has to be scheduled through the coordinator d/t insurance is medicaid and they will contact patient to schedule. They are waiting for schedule template to open and currently do not have any appointments available. They are hoping the schedule will be available at the end of the month. Patient was also placed on a recall list so as soon as an appointment spot opens, she will be called.   Called spoke with pt and made aware  FYI to Dr. Darrick Penna

## 2017-08-22 NOTE — Telephone Encounter (Signed)
fowarding to Dr. Jena Gauss

## 2017-08-22 NOTE — Telephone Encounter (Signed)
Patient said that she talked with SF over the weekend and was told the call the office if she hadn't heard from [facility]. She hasn't heard and was calling to let us know. [phone]

## 2017-08-23 ENCOUNTER — Telehealth: Payer: Self-pay | Admitting: Internal Medicine

## 2017-08-23 NOTE — Telephone Encounter (Signed)
PATIENT CALLED ABOUT HER REFERRAL TO [facility] AND HAD A FEW QUESTIONS, PLEASE CALL

## 2017-08-23 NOTE — Telephone Encounter (Signed)
Spoke with pt, pt wanted to know if the same rules would apply for being seen with Medicaid at the Emergency Room Department at [facility] ? Pt is aware that I didn't know the policy for the Emergency Room vs [facility] rules. Pt can call the Emergency Room and as them their policy about being seen and discuss her insurance with them.

## 2017-08-23 NOTE — Telephone Encounter (Signed)
Please proceed next available appointment at [facility].

## 2017-08-23 NOTE — Telephone Encounter (Signed)
It would -  but she has already been seen over at [facility] and it might be in her best interest to stick with them for continuity of care. Could go either way.

## 2017-08-23 NOTE — Telephone Encounter (Signed)
Pt aware.

## 2017-08-23 NOTE — Telephone Encounter (Signed)
See other phone note

## 2017-08-23 NOTE — Telephone Encounter (Signed)
Patient called and wants to know if [name] would be suitable for her to go instead of [name]?

## 2017-08-23 NOTE — Telephone Encounter (Signed)
Patient had enough phenergan and oxycodone to get her thru the weekend. She is asking to get another prescription to get her thru until she can be seen at [facility]. Please advise and call her at [phone]

## 2017-08-24 NOTE — Telephone Encounter (Signed)
Spoke with pt see other phone note.

## 2017-08-29 NOTE — Telephone Encounter (Signed)
[facility] scheduled patient for 11/21/17 at 1:!5pm with Dr. Tora Duck. She will be sent a packet in the mail. Patient also has been added to the cancellation list. Patient aware

## 2018-02-03 ENCOUNTER — Emergency Department (HOSPITAL_COMMUNITY)
Admission: EM | Admit: 2018-02-03 | Discharge: 2018-02-03 | Disposition: A | Discharge status: Home / Self Care | Payer: Medicaid Other | Attending: Emergency Medicine | Admitting: Emergency Medicine

## 2018-02-03 ENCOUNTER — Encounter (HOSPITAL_COMMUNITY): Payer: Self-pay | Admitting: Emergency Medicine

## 2018-02-03 ENCOUNTER — Other Ambulatory Visit: Payer: Self-pay

## 2018-02-03 DIAGNOSIS — R112 Nausea with vomiting, unspecified: Secondary | ICD-10-CM | POA: Diagnosis not present

## 2018-02-03 DIAGNOSIS — I10 Essential (primary) hypertension: Secondary | ICD-10-CM | POA: Diagnosis not present

## 2018-02-03 DIAGNOSIS — Z79899 Other long term (current) drug therapy: Secondary | ICD-10-CM | POA: Insufficient documentation

## 2018-02-03 DIAGNOSIS — K861 Other chronic pancreatitis: Secondary | ICD-10-CM | POA: Insufficient documentation

## 2018-02-03 DIAGNOSIS — F17210 Nicotine dependence, cigarettes, uncomplicated: Secondary | ICD-10-CM | POA: Diagnosis not present

## 2018-02-03 DIAGNOSIS — K859 Acute pancreatitis without necrosis or infection, unspecified: Secondary | ICD-10-CM | POA: Diagnosis not present

## 2018-02-03 DIAGNOSIS — R1013 Epigastric pain: Secondary | ICD-10-CM | POA: Diagnosis present

## 2018-02-03 LAB — CBC
HCT: 42.9 % (ref 36.0–46.0)
Hemoglobin: 14.4 g/dL (ref 12.0–15.0)
MCH: 30.1 pg (ref 26.0–34.0)
MCHC: 33.6 g/dL (ref 30.0–36.0)
MCV: 89.6 fL (ref 80.0–100.0)
NRBC: 0 % (ref 0.0–0.2)
PLATELETS: 359 10*3/uL (ref 150–400)
RBC: 4.79 MIL/uL (ref 3.87–5.11)
RDW: 13.6 % (ref 11.5–15.5)
WBC: 14 10*3/uL — ABNORMAL HIGH (ref 4.0–10.5)

## 2018-02-03 LAB — COMPREHENSIVE METABOLIC PANEL
ALT: 16 U/L (ref 0–44)
AST: 16 U/L (ref 15–41)
Albumin: 4.5 g/dL (ref 3.5–5.0)
Alkaline Phosphatase: 67 U/L (ref 38–126)
Anion gap: 9 (ref 5–15)
BUN: 10 mg/dL (ref 6–20)
CHLORIDE: 107 mmol/L (ref 98–111)
CO2: 22 mmol/L (ref 22–32)
Calcium: 9.2 mg/dL (ref 8.9–10.3)
Creatinine, Ser: 0.63 mg/dL (ref 0.44–1.00)
GFR calc non Af Amer: >60 mL/min (ref >60)
Glucose, Bld: 121 mg/dL — ABNORMAL HIGH (ref 70–99)
Potassium: 3.8 mmol/L (ref 3.5–5.1)
SODIUM: 138 mmol/L (ref 135–145)
Total Bilirubin: 0.7 mg/dL (ref 0.3–1.2)
Total Protein: 7.7 g/dL (ref 6.5–8.1)

## 2018-02-03 LAB — HCG, QUANTITATIVE, PREGNANCY: hCG, Beta Chain, Quant, S: 1 m[IU]/mL (ref <5)

## 2018-02-03 LAB — LIPASE, BLOOD: LIPASE: 71 U/L — AB (ref 11–51)

## 2018-02-03 MED ORDER — ONDANSETRON 8 MG PO TBDP: ORAL_TABLET | ORAL | 0 refills | Status: DC

## 2018-02-03 MED ORDER — ONDANSETRON HCL 4 MG/2ML IJ SOLN
4.0000 mg | Freq: Once | INTRAMUSCULAR | Status: AC
  Administered 2018-02-03: 4 mg via INTRAVENOUS
  Filled 2018-02-03: qty 2

## 2018-02-03 MED ORDER — SODIUM CHLORIDE 0.9 % IV BOLUS
1000.0000 mL | Freq: Once | INTRAVENOUS | Status: AC
  Administered 2018-02-03: 1000 mL via INTRAVENOUS

## 2018-02-03 MED ORDER — ACETAMINOPHEN 325 MG PO TABS
650.0000 mg | ORAL_TABLET | Freq: Once | ORAL | Status: AC
  Administered 2018-02-03: 650 mg via ORAL
  Filled 2018-02-03: qty 2

## 2018-02-03 NOTE — Discharge Instructions (Signed)
Stay on clear liquids such as juice, Jell-O, clear broth, Gatorade for the next 24 hours.  Take the medication prescribed as needed for nausea.  It is okay to take Tylenol every 4 hours as needed for pain.  Return to the emergency department if you continue to vomit despite taking the medication prescribed for nausea or after taking her Phenergan previously prescribed to you.  Or if you develop fever with temperature higher than 100.4 or are concerned for any reason.  Otherwise keep scheduled appointment with your gastroenterologist
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
## 2018-02-03 NOTE — ED Triage Notes (Signed)
Upper abd pain that radiates through to back.  N/V.  Hx of pancreatitis

## 2018-02-03 NOTE — ED Notes (Signed)
Pt states her nausea is back. Will notify MD

## 2018-06-07 ENCOUNTER — Other Ambulatory Visit: Payer: Self-pay

## 2018-06-07 ENCOUNTER — Encounter (HOSPITAL_COMMUNITY): Payer: Self-pay | Admitting: *Deleted

## 2018-06-07 ENCOUNTER — Emergency Department (HOSPITAL_COMMUNITY)
Admission: EM | Admit: 2018-06-07 | Discharge: 2018-06-07 | Disposition: A | Discharge status: Home / Self Care | Payer: Medicaid Other | Attending: Emergency Medicine | Admitting: Emergency Medicine

## 2018-06-07 DIAGNOSIS — K859 Acute pancreatitis without necrosis or infection, unspecified: Secondary | ICD-10-CM

## 2018-06-07 DIAGNOSIS — F17210 Nicotine dependence, cigarettes, uncomplicated: Secondary | ICD-10-CM | POA: Diagnosis not present

## 2018-06-07 DIAGNOSIS — I10 Essential (primary) hypertension: Secondary | ICD-10-CM | POA: Diagnosis not present

## 2018-06-07 DIAGNOSIS — K861 Other chronic pancreatitis: Secondary | ICD-10-CM | POA: Insufficient documentation

## 2018-06-07 DIAGNOSIS — Z8719 Personal history of other diseases of the digestive system: Secondary | ICD-10-CM | POA: Diagnosis not present

## 2018-06-07 DIAGNOSIS — Z79899 Other long term (current) drug therapy: Secondary | ICD-10-CM | POA: Diagnosis not present

## 2018-06-07 DIAGNOSIS — R1013 Epigastric pain: Secondary | ICD-10-CM | POA: Diagnosis present

## 2018-06-07 LAB — COMPREHENSIVE METABOLIC PANEL
ALT: 18 U/L (ref 0–44)
AST: 16 U/L (ref 15–41)
Albumin: 4.4 g/dL (ref 3.5–5.0)
Alkaline Phosphatase: 63 U/L (ref 38–126)
Anion gap: 9 (ref 5–15)
BUN: 12 mg/dL (ref 6–20)
CO2: 22 mmol/L (ref 22–32)
Calcium: 9.2 mg/dL (ref 8.9–10.3)
Chloride: 106 mmol/L (ref 98–111)
Creatinine, Ser: 0.55 mg/dL (ref 0.44–1.00)
GFR calc Af Amer: >60 mL/min (ref >60)
GFR calc non Af Amer: >60 mL/min (ref >60)
Glucose, Bld: 108 mg/dL — ABNORMAL HIGH (ref 70–99)
Potassium: 3.8 mmol/L (ref 3.5–5.1)
Sodium: 137 mmol/L (ref 135–145)
Total Bilirubin: 0.6 mg/dL (ref 0.3–1.2)
Total Protein: 7.6 g/dL (ref 6.5–8.1)

## 2018-06-07 LAB — URINALYSIS, ROUTINE W REFLEX MICROSCOPIC
Bilirubin Urine: NEGATIVE
Glucose, UA: 50 mg/dL — AB
Hgb urine dipstick: NEGATIVE
Ketones, ur: NEGATIVE mg/dL
NITRITE: NEGATIVE
Protein, ur: NEGATIVE mg/dL
Specific Gravity, Urine: 1.004 — ABNORMAL LOW (ref 1.005–1.030)
pH: 6 (ref 5.0–8.0)

## 2018-06-07 LAB — LIPASE, BLOOD: Lipase: 66 U/L — ABNORMAL HIGH (ref 11–51)

## 2018-06-07 LAB — CBC WITH DIFFERENTIAL/PLATELET
Abs Immature Granulocytes: 0.01 10*3/uL (ref 0.00–0.07)
BASOS PCT: 1 %
Basophils Absolute: 0 10*3/uL (ref 0.0–0.1)
Eosinophils Absolute: 0.4 10*3/uL (ref 0.0–0.5)
Eosinophils Relative: 5 %
HCT: 42.6 % (ref 36.0–46.0)
Hemoglobin: 13.9 g/dL (ref 12.0–15.0)
Immature Granulocytes: 0 %
Lymphocytes Relative: 28 %
Lymphs Abs: 2.1 10*3/uL (ref 0.7–4.0)
MCH: 30.5 pg (ref 26.0–34.0)
MCHC: 32.6 g/dL (ref 30.0–36.0)
MCV: 93.4 fL (ref 80.0–100.0)
Monocytes Absolute: 0.6 10*3/uL (ref 0.1–1.0)
Monocytes Relative: 8 %
Neutro Abs: 4.3 10*3/uL (ref 1.7–7.7)
Neutrophils Relative %: 58 %
Platelets: 279 10*3/uL (ref 150–400)
RBC: 4.56 MIL/uL (ref 3.87–5.11)
RDW: 13.2 % (ref 11.5–15.5)
WBC: 7.4 10*3/uL (ref 4.0–10.5)
nRBC: 0 % (ref 0.0–0.2)

## 2018-06-07 LAB — I-STAT BETA HCG BLOOD, ED (MC, WL, AP ONLY): I-stat hCG, quantitative: <5 m[IU]/mL (ref <5)

## 2018-06-07 MED ORDER — SODIUM CHLORIDE 0.9 % IV BOLUS
1000.0000 mL | Freq: Once | INTRAVENOUS | Status: AC
  Administered 2018-06-07: 1000 mL via INTRAVENOUS

## 2018-06-07 MED ORDER — MORPHINE SULFATE (PF) 4 MG/ML IV SOLN
4.0000 mg | Freq: Once | INTRAVENOUS | Status: AC
  Administered 2018-06-07: 4 mg via INTRAVENOUS
  Filled 2018-06-07: qty 1

## 2018-06-07 MED ORDER — ONDANSETRON 4 MG PO TBDP: 4.0000 mg | ORAL_TABLET | Freq: Three times a day (TID) | ORAL | 0 refills | Status: DC | PRN

## 2018-06-07 MED ORDER — ACETAMINOPHEN 325 MG PO TABS
650.0000 mg | ORAL_TABLET | Freq: Once | ORAL | Status: AC
  Administered 2018-06-07: 650 mg via ORAL
  Filled 2018-06-07: qty 2

## 2018-06-07 MED ORDER — ONDANSETRON HCL 4 MG/2ML IJ SOLN
4.0000 mg | Freq: Once | INTRAMUSCULAR | Status: AC
  Administered 2018-06-07: 4 mg via INTRAVENOUS
  Filled 2018-06-07: qty 2

## 2018-06-07 MED ORDER — DIPHENHYDRAMINE HCL 12.5 MG/5ML PO ELIX
12.5000 mg | ORAL_SOLUTION | Freq: Once | ORAL | Status: AC
  Administered 2018-06-07: 12.5 mg via ORAL
  Filled 2018-06-07: qty 5

## 2018-06-07 NOTE — Discharge Instructions (Addendum)
You have been diagnosed today with Acute on Chronic Pancreatitis.  At this time there does not appear to be the presence of an emergent medical condition, however there is always the potential for conditions to change. Please read and follow the below instructions.  Please return to the Emergency Department immediately for any new or worsening symptoms. Please be sure to follow up with your Primary Care Provider within 5 days regarding your visit today; please call their office to schedule an appointment even if you are feeling better for a follow-up visit. Please follow the clear liquid diet as we discussed over the next 24 hours.  Slowly progress your diet as tolerated thereafter. You may use the nausea medication Zofran as prescribed. Your urine has been sent for culture.  You will be contacted by [facility] if it grows bacteria that require antibiotic treatment.  Get help right away if: You cannot eat or keep fluids down. Your pain becomes severe. Your skin or the white part of your eyes turns yellow (jaundice). You have sudden swelling in your abdomen. You vomit. You feel dizzy or you faint. Your blood sugar is high (over 300 mg/dL). Develop new or worsening symptoms. Have persistent pain, weakness, or nausea. Recover and then have another episode of pain. Have a fever.  Please read the additional information packets attached to your discharge summary.  Do not take your medicine if  develop an itchy rash, swelling in your mouth or lips, or difficulty breathing.

## 2018-06-07 NOTE — ED Triage Notes (Signed)
Abdominal pain with vomiting and diarrhea

## 2018-06-07 NOTE — ED Notes (Signed)
Pt requesting more nausea medication.  PA notified. 

## 2018-06-07 NOTE — ED Notes (Signed)
Pt requesting more pain medication. States Tylenol is not working and pain is worsening. PA notified.
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
## 2018-06-08 LAB — URINE CULTURE: Culture: NO GROWTH

## 2018-07-04 ENCOUNTER — Emergency Department (HOSPITAL_COMMUNITY)
Admission: EM | Admit: 2018-07-04 | Discharge: 2018-07-05 | Disposition: A | Discharge status: Home / Self Care | Payer: Medicaid Other | Attending: Emergency Medicine | Admitting: Emergency Medicine

## 2018-07-04 ENCOUNTER — Other Ambulatory Visit: Payer: Self-pay

## 2018-07-04 ENCOUNTER — Encounter (HOSPITAL_COMMUNITY): Payer: Self-pay | Admitting: Emergency Medicine

## 2018-07-04 DIAGNOSIS — Z79899 Other long term (current) drug therapy: Secondary | ICD-10-CM | POA: Diagnosis not present

## 2018-07-04 DIAGNOSIS — K859 Acute pancreatitis without necrosis or infection, unspecified: Secondary | ICD-10-CM

## 2018-07-04 DIAGNOSIS — F17210 Nicotine dependence, cigarettes, uncomplicated: Secondary | ICD-10-CM | POA: Insufficient documentation

## 2018-07-04 DIAGNOSIS — R1013 Epigastric pain: Secondary | ICD-10-CM | POA: Diagnosis not present

## 2018-07-04 DIAGNOSIS — I10 Essential (primary) hypertension: Secondary | ICD-10-CM | POA: Insufficient documentation

## 2018-07-04 DIAGNOSIS — K861 Other chronic pancreatitis: Secondary | ICD-10-CM | POA: Diagnosis not present

## 2018-07-04 LAB — CBC WITH DIFFERENTIAL/PLATELET
Abs Immature Granulocytes: 0.05 10*3/uL (ref 0.00–0.07)
Basophils Absolute: 0.1 10*3/uL (ref 0.0–0.1)
Basophils Relative: 1 %
EOS ABS: 0.7 10*3/uL — AB (ref 0.0–0.5)
EOS PCT: 5 %
HCT: 43.8 % (ref 36.0–46.0)
Hemoglobin: 14.1 g/dL (ref 12.0–15.0)
Immature Granulocytes: 0 %
Lymphocytes Relative: 17 %
Lymphs Abs: 2.6 10*3/uL (ref 0.7–4.0)
MCH: 30.7 pg (ref 26.0–34.0)
MCHC: 32.2 g/dL (ref 30.0–36.0)
MCV: 95.2 fL (ref 80.0–100.0)
Monocytes Absolute: 1.2 10*3/uL — ABNORMAL HIGH (ref 0.1–1.0)
Monocytes Relative: 8 %
Neutro Abs: 10.8 10*3/uL — ABNORMAL HIGH (ref 1.7–7.7)
Neutrophils Relative %: 69 %
Platelets: 334 10*3/uL (ref 150–400)
RBC: 4.6 MIL/uL (ref 3.87–5.11)
RDW: 13.2 % (ref 11.5–15.5)
WBC: 15.4 10*3/uL — AB (ref 4.0–10.5)
nRBC: 0 % (ref 0.0–0.2)

## 2018-07-04 LAB — BASIC METABOLIC PANEL
Anion gap: 9 (ref 5–15)
BUN: 12 mg/dL (ref 6–20)
CO2: 23 mmol/L (ref 22–32)
Calcium: 9 mg/dL (ref 8.9–10.3)
Chloride: 103 mmol/L (ref 98–111)
Creatinine, Ser: 0.64 mg/dL (ref 0.44–1.00)
GFR calc Af Amer: >60 mL/min (ref >60)
GFR calc non Af Amer: >60 mL/min (ref >60)
Glucose, Bld: 112 mg/dL — ABNORMAL HIGH (ref 70–99)
POTASSIUM: 3.9 mmol/L (ref 3.5–5.1)
Sodium: 135 mmol/L (ref 135–145)

## 2018-07-04 LAB — URINALYSIS, ROUTINE W REFLEX MICROSCOPIC
Bilirubin Urine: NEGATIVE
Glucose, UA: NEGATIVE mg/dL
Hgb urine dipstick: NEGATIVE
Ketones, ur: NEGATIVE mg/dL
Leukocytes,Ua: NEGATIVE
Nitrite: NEGATIVE
PROTEIN: NEGATIVE mg/dL
Specific Gravity, Urine: 1.027 (ref 1.005–1.030)
pH: 5 (ref 5.0–8.0)

## 2018-07-04 LAB — LIPASE, BLOOD: Lipase: 286 U/L — ABNORMAL HIGH (ref 11–51)

## 2018-07-04 MED ORDER — PROMETHAZINE HCL 25 MG/ML IJ SOLN
25.0000 mg | Freq: Once | INTRAMUSCULAR | Status: AC
  Administered 2018-07-04: 25 mg via INTRAMUSCULAR
  Filled 2018-07-04: qty 1

## 2018-07-04 MED ORDER — ONDANSETRON 8 MG PO TBDP
8.0000 mg | ORAL_TABLET | Freq: Once | ORAL | Status: AC
  Administered 2018-07-04: 8 mg via ORAL
  Filled 2018-07-04: qty 1

## 2018-07-04 MED ORDER — HYDROCODONE-ACETAMINOPHEN 5-325 MG PO TABS
2.0000 | ORAL_TABLET | Freq: Once | ORAL | Status: AC
  Administered 2018-07-04: 2 via ORAL
  Filled 2018-07-04: qty 2

## 2018-07-04 MED ORDER — DIPHENHYDRAMINE HCL 25 MG PO CAPS
25.0000 mg | ORAL_CAPSULE | Freq: Once | ORAL | Status: AC
  Administered 2018-07-04: 25 mg via ORAL
  Filled 2018-07-04: qty 1

## 2018-07-04 MED ORDER — SODIUM CHLORIDE 0.9 % IV BOLUS
1000.0000 mL | Freq: Once | INTRAVENOUS | Status: AC
  Administered 2018-07-04: 1000 mL via INTRAVENOUS

## 2018-07-04 MED ORDER — HYDROMORPHONE HCL 1 MG/ML IJ SOLN
1.0000 mg | Freq: Once | INTRAMUSCULAR | Status: AC
  Administered 2018-07-04: 1 mg via INTRAVENOUS
  Filled 2018-07-04: qty 1

## 2018-07-04 NOTE — ED Triage Notes (Signed)
Patient complaining of upper abdominal pain radiating into back starting this morning. Also states vomited x 2 today.
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
## 2018-07-04 NOTE — ED Provider Notes (Signed)
Medical screening examination/treatment/procedure(s) were conducted as a shared visit with non-physician practitioner(s) and myself.  I personally evaluated the patient during the encounter.  Clinical Impression:   Final diagnoses:  Acute on chronic pancreatitis [facility])    The patient is a 33 year old female with an unfortunate history of chronic pancreatitis secondary to trauma who comes in today with 1 day of abdominal pain that is starting in the epigastrium and goes to the back.  She did vomit once or twice today as well.  On my exam she has a very soft abdomen with mild to moderate tenderness in the epigastrium without guarding, normal vital signs without tachycardia hypotension or fever and no other complaints.  She has been tried to take Tylenol but states when she tried to take it earlier today she did vomit.  Patient will get a dose of Zofran followed by 2 Vicodin and a p.o. trial.  She is agreeable to this plan, I do not think she needs advanced imaging given her history chronic exam and very limited timeframe of symptoms.  She is to follow with the pain clinic but no longer sees them because she states she did not want to be on chronic opiates.  She has a follow-up planned with [facility] soon   Eber Hong, MD 07/06/18 1335

## 2018-07-05 LAB — HEPATIC FUNCTION PANEL
ALT: 16 U/L (ref 0–44)
AST: 16 U/L (ref 15–41)
Albumin: 4.5 g/dL (ref 3.5–5.0)
Alkaline Phosphatase: 81 U/L (ref 38–126)
Bilirubin, Direct: <0.1 mg/dL (ref 0.0–0.2)
Total Bilirubin: 0.3 mg/dL (ref 0.3–1.2)
Total Protein: 8.1 g/dL (ref 6.5–8.1)

## 2018-07-05 LAB — PREGNANCY, URINE: Preg Test, Ur: NEGATIVE

## 2018-07-05 MED ORDER — ONDANSETRON 4 MG PO TBDP: 4.0000 mg | ORAL_TABLET | Freq: Three times a day (TID) | ORAL | 0 refills | Status: DC | PRN

## 2018-07-05 MED ORDER — SODIUM CHLORIDE 0.9 % IV BOLUS
1000.0000 mL | Freq: Once | INTRAVENOUS | Status: AC
  Administered 2018-07-05: 1000 mL via INTRAVENOUS

## 2018-07-05 MED ORDER — HYDROMORPHONE HCL 1 MG/ML IJ SOLN
1.0000 mg | Freq: Once | INTRAMUSCULAR | Status: AC
  Administered 2018-07-05: 1 mg via INTRAVENOUS
  Filled 2018-07-05: qty 1

## 2018-07-05 NOTE — ED Notes (Signed)
Gave patient sprite to drink for fluid challenge.

## 2018-07-05 NOTE — Discharge Instructions (Signed)
Your liver function hasn't come back yet. You will be called if there is a problem with the numbers. Follow the clear liquid diet as outlined in the instructions.  Follow-up with your gastroenterologist.  Return to the ED with worsening pain, fever, vomiting or any other concerns.

## 2018-07-05 NOTE — ED Provider Notes (Signed)
Care assumed from Dr. Hyacinth Meeker and Lonell Grandchild. Patient with epigastric pain, nausea and vomiting similar to previous episodes of pancreatitis.  Denies fevers.  3 episodes of vomiting before coming to the ED and none since.  Labs show lipase elevation of 286.  WBC 15.  LFTs within normal limits.  Patient awaiting pain control and ability to tolerate p.o.  After second dose of pain medication, patient is tolerating p.o. and states her pain is improved and she would like to go home.  She believes she will be able to manage with clear liquids and follow-up with her gastroenterologist.  She understands which diet to follow. LFTs were not ordered initially but were added on. Patient was adamant she needed to leave before they resulted as to not lose her ride.  BP (!) 141/101 (BP Location: Left Arm)   Pulse 74   Temp 98 F (36.7 C) (Oral)   Resp 16   Ht 5\' 4"  (1.626 m)   Wt 52.2 kg   LMP 06/27/2018   SpO2 97%   BMI 19.74 kg/m    LFTs did subsequently return normal.      Glynn Octave, MD 07/05/18 [phone]

## 2018-07-05 NOTE — ED Notes (Signed)
Patient has drank approximately 1/2 sprite with no vomiting noted.

## 2018-07-05 NOTE — ED Notes (Signed)
Pt cell for labs if needed [phone]

## 2018-07-26 ENCOUNTER — Emergency Department (HOSPITAL_COMMUNITY)
Admission: EM | Admit: 2018-07-26 | Discharge: 2018-07-26 | Disposition: A | Discharge status: Home / Self Care | Payer: Medicaid Other | Attending: Emergency Medicine | Admitting: Emergency Medicine

## 2018-07-26 ENCOUNTER — Encounter (HOSPITAL_COMMUNITY): Payer: Self-pay

## 2018-07-26 ENCOUNTER — Other Ambulatory Visit: Payer: Self-pay

## 2018-07-26 ENCOUNTER — Emergency Department (HOSPITAL_COMMUNITY): Payer: Medicaid Other

## 2018-07-26 DIAGNOSIS — K861 Other chronic pancreatitis: Secondary | ICD-10-CM

## 2018-07-26 DIAGNOSIS — I10 Essential (primary) hypertension: Secondary | ICD-10-CM | POA: Insufficient documentation

## 2018-07-26 DIAGNOSIS — R1013 Epigastric pain: Secondary | ICD-10-CM | POA: Diagnosis not present

## 2018-07-26 DIAGNOSIS — F17210 Nicotine dependence, cigarettes, uncomplicated: Secondary | ICD-10-CM | POA: Insufficient documentation

## 2018-07-26 DIAGNOSIS — Z79899 Other long term (current) drug therapy: Secondary | ICD-10-CM | POA: Diagnosis not present

## 2018-07-26 DIAGNOSIS — R1010 Upper abdominal pain, unspecified: Secondary | ICD-10-CM | POA: Diagnosis not present

## 2018-07-26 DIAGNOSIS — R112 Nausea with vomiting, unspecified: Secondary | ICD-10-CM | POA: Diagnosis not present

## 2018-07-26 LAB — CBC WITH DIFFERENTIAL/PLATELET
Abs Immature Granulocytes: 0.02 10*3/uL (ref 0.00–0.07)
Basophils Absolute: 0.1 10*3/uL (ref 0.0–0.1)
Basophils Relative: 1 %
Eosinophils Absolute: 0.4 10*3/uL (ref 0.0–0.5)
Eosinophils Relative: 4 %
HCT: 39.5 % (ref 36.0–46.0)
Hemoglobin: 13.1 g/dL (ref 12.0–15.0)
Immature Granulocytes: 0 %
Lymphocytes Relative: 18 %
Lymphs Abs: 1.9 10*3/uL (ref 0.7–4.0)
MCH: 31.1 pg (ref 26.0–34.0)
MCHC: 33.2 g/dL (ref 30.0–36.0)
MCV: 93.8 fL (ref 80.0–100.0)
Monocytes Absolute: 0.7 10*3/uL (ref 0.1–1.0)
Monocytes Relative: 7 %
Neutro Abs: 7.5 10*3/uL (ref 1.7–7.7)
Neutrophils Relative %: 70 %
Platelets: 455 10*3/uL — ABNORMAL HIGH (ref 150–400)
RBC: 4.21 MIL/uL (ref 3.87–5.11)
RDW: 14 % (ref 11.5–15.5)
WBC: 10.6 10*3/uL — ABNORMAL HIGH (ref 4.0–10.5)
nRBC: 0 % (ref 0.0–0.2)

## 2018-07-26 LAB — COMPREHENSIVE METABOLIC PANEL
ALT: 38 U/L (ref 0–44)
AST: 27 U/L (ref 15–41)
Albumin: 4.4 g/dL (ref 3.5–5.0)
Alkaline Phosphatase: 89 U/L (ref 38–126)
Anion gap: 9 (ref 5–15)
BUN: 20 mg/dL (ref 6–20)
CO2: 19 mmol/L — ABNORMAL LOW (ref 22–32)
Calcium: 9.1 mg/dL (ref 8.9–10.3)
Chloride: 107 mmol/L (ref 98–111)
Creatinine, Ser: 0.52 mg/dL (ref 0.44–1.00)
GFR calc Af Amer: >60 mL/min (ref >60)
GFR calc non Af Amer: >60 mL/min (ref >60)
Glucose, Bld: 102 mg/dL — ABNORMAL HIGH (ref 70–99)
Potassium: 4.1 mmol/L (ref 3.5–5.1)
Sodium: 135 mmol/L (ref 135–145)
Total Bilirubin: 0.4 mg/dL (ref 0.3–1.2)
Total Protein: 7.6 g/dL (ref 6.5–8.1)

## 2018-07-26 LAB — URINALYSIS, ROUTINE W REFLEX MICROSCOPIC
Bilirubin Urine: NEGATIVE
Glucose, UA: NEGATIVE mg/dL
Hgb urine dipstick: NEGATIVE
Ketones, ur: 5 mg/dL — AB
Leukocytes,Ua: NEGATIVE
Nitrite: NEGATIVE
Protein, ur: NEGATIVE mg/dL
Specific Gravity, Urine: 1.03 (ref 1.005–1.030)
pH: 5 (ref 5.0–8.0)

## 2018-07-26 LAB — PREGNANCY, URINE: Preg Test, Ur: NEGATIVE

## 2018-07-26 LAB — LIPASE, BLOOD: Lipase: 97 U/L — ABNORMAL HIGH (ref 11–51)

## 2018-07-26 MED ORDER — SODIUM CHLORIDE 0.9 % IV SOLN: INTRAVENOUS | Status: DC

## 2018-07-26 MED ORDER — SODIUM CHLORIDE 0.9 % IV BOLUS
1000.0000 mL | Freq: Once | INTRAVENOUS | Status: AC
  Administered 2018-07-26: 1000 mL via INTRAVENOUS

## 2018-07-26 MED ORDER — ONDANSETRON 8 MG PO TBDP: 8.0000 mg | ORAL_TABLET | Freq: Three times a day (TID) | ORAL | 0 refills | Status: DC | PRN

## 2018-07-26 MED ORDER — HYDROMORPHONE HCL 1 MG/ML IJ SOLN
0.5000 mg | Freq: Once | INTRAMUSCULAR | Status: AC
  Administered 2018-07-26: 0.5 mg via INTRAVENOUS
  Filled 2018-07-26: qty 1

## 2018-07-26 MED ORDER — PROCHLORPERAZINE EDISYLATE 10 MG/2ML IJ SOLN
10.0000 mg | Freq: Once | INTRAMUSCULAR | Status: AC
  Administered 2018-07-26: 09:00:00 10 mg via INTRAVENOUS
  Filled 2018-07-26: qty 2

## 2018-07-26 NOTE — ED Triage Notes (Signed)
Pt c/o abd pain and vomiting x 2 days LBM was this morning per pt.  LMP started 4 days ago.

## 2018-07-26 NOTE — Discharge Instructions (Addendum)
The medications as prescribed, follow-up with a GI doctor as we discussed, return as needed for worsening symptoms
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
## 2018-07-26 NOTE — ED Notes (Signed)
Patient transported to X-ray 

## 2018-07-26 NOTE — ED Notes (Addendum)
Patient transported to x-ray. ?

## 2018-07-29 ENCOUNTER — Encounter (HOSPITAL_COMMUNITY): Payer: Self-pay | Admitting: Emergency Medicine

## 2018-07-29 ENCOUNTER — Emergency Department (HOSPITAL_COMMUNITY)
Admission: EM | Admit: 2018-07-29 | Discharge: 2018-07-29 | Disposition: A | Discharge status: Home / Self Care | Payer: Medicaid Other | Attending: Emergency Medicine | Admitting: Emergency Medicine

## 2018-07-29 ENCOUNTER — Other Ambulatory Visit: Payer: Self-pay

## 2018-07-29 DIAGNOSIS — K859 Acute pancreatitis without necrosis or infection, unspecified: Secondary | ICD-10-CM | POA: Insufficient documentation

## 2018-07-29 DIAGNOSIS — I10 Essential (primary) hypertension: Secondary | ICD-10-CM | POA: Insufficient documentation

## 2018-07-29 DIAGNOSIS — Z79899 Other long term (current) drug therapy: Secondary | ICD-10-CM | POA: Diagnosis not present

## 2018-07-29 DIAGNOSIS — F17210 Nicotine dependence, cigarettes, uncomplicated: Secondary | ICD-10-CM | POA: Insufficient documentation

## 2018-07-29 DIAGNOSIS — K861 Other chronic pancreatitis: Secondary | ICD-10-CM | POA: Diagnosis not present

## 2018-07-29 DIAGNOSIS — R109 Unspecified abdominal pain: Secondary | ICD-10-CM | POA: Diagnosis present

## 2018-07-29 LAB — COMPREHENSIVE METABOLIC PANEL
ALT: 16 U/L (ref 0–44)
AST: 13 U/L — ABNORMAL LOW (ref 15–41)
Albumin: 4.4 g/dL (ref 3.5–5.0)
Alkaline Phosphatase: 87 U/L (ref 38–126)
Anion gap: 10 (ref 5–15)
BUN: 18 mg/dL (ref 6–20)
CO2: 23 mmol/L (ref 22–32)
Calcium: 9.2 mg/dL (ref 8.9–10.3)
Chloride: 102 mmol/L (ref 98–111)
Creatinine, Ser: 0.57 mg/dL (ref 0.44–1.00)
GFR calc Af Amer: >60 mL/min (ref >60)
GFR calc non Af Amer: >60 mL/min (ref >60)
Glucose, Bld: 119 mg/dL — ABNORMAL HIGH (ref 70–99)
Potassium: 3.3 mmol/L — ABNORMAL LOW (ref 3.5–5.1)
Sodium: 135 mmol/L (ref 135–145)
Total Bilirubin: 0.2 mg/dL — ABNORMAL LOW (ref 0.3–1.2)
Total Protein: 8 g/dL (ref 6.5–8.1)

## 2018-07-29 LAB — CBC WITH DIFFERENTIAL/PLATELET
Abs Immature Granulocytes: 0.03 10*3/uL (ref 0.00–0.07)
Basophils Absolute: 0.1 10*3/uL (ref 0.0–0.1)
Basophils Relative: 1 %
Eosinophils Absolute: 0.6 10*3/uL — ABNORMAL HIGH (ref 0.0–0.5)
Eosinophils Relative: 6 %
HCT: 39.5 % (ref 36.0–46.0)
Hemoglobin: 13.3 g/dL (ref 12.0–15.0)
Immature Granulocytes: 0 %
Lymphocytes Relative: 25 %
Lymphs Abs: 2.6 10*3/uL (ref 0.7–4.0)
MCH: 31.4 pg (ref 26.0–34.0)
MCHC: 33.7 g/dL (ref 30.0–36.0)
MCV: 93.4 fL (ref 80.0–100.0)
Monocytes Absolute: 0.8 10*3/uL (ref 0.1–1.0)
Monocytes Relative: 7 %
Neutro Abs: 6.5 10*3/uL (ref 1.7–7.7)
Neutrophils Relative %: 61 %
Platelets: 455 10*3/uL — ABNORMAL HIGH (ref 150–400)
RBC: 4.23 MIL/uL (ref 3.87–5.11)
RDW: 13.9 % (ref 11.5–15.5)
WBC: 10.6 10*3/uL — ABNORMAL HIGH (ref 4.0–10.5)
nRBC: 0 % (ref 0.0–0.2)

## 2018-07-29 LAB — LIPASE, BLOOD: Lipase: 83 U/L — ABNORMAL HIGH (ref 11–51)

## 2018-07-29 MED ORDER — HYDROMORPHONE HCL 1 MG/ML IJ SOLN
INTRAMUSCULAR | Status: AC
  Filled 2018-07-29: qty 1

## 2018-07-29 MED ORDER — ONDANSETRON HCL 4 MG/2ML IJ SOLN
INTRAMUSCULAR | Status: AC
  Filled 2018-07-29: qty 2

## 2018-07-29 MED ORDER — ONDANSETRON HCL 4 MG/2ML IJ SOLN
4.0000 mg | Freq: Once | INTRAMUSCULAR | Status: AC
  Administered 2018-07-29: 4 mg via INTRAVENOUS

## 2018-07-29 MED ORDER — PANTOPRAZOLE SODIUM 40 MG IV SOLR
40.0000 mg | Freq: Once | INTRAVENOUS | Status: AC
  Administered 2018-07-29: 40 mg via INTRAVENOUS
  Filled 2018-07-29: qty 40

## 2018-07-29 MED ORDER — SODIUM CHLORIDE 0.9 % IV BOLUS
1000.0000 mL | Freq: Once | INTRAVENOUS | Status: AC
  Administered 2018-07-29: 1000 mL via INTRAVENOUS

## 2018-07-29 MED ORDER — ONDANSETRON HCL 8 MG PO TABS: 8.0000 mg | ORAL_TABLET | ORAL | 0 refills | Status: DC | PRN

## 2018-07-29 MED ORDER — OXYCODONE-ACETAMINOPHEN 5-325 MG PO TABS: 1.0000 | ORAL_TABLET | ORAL | 0 refills | Status: DC | PRN

## 2018-07-29 MED ORDER — HYDROMORPHONE HCL 1 MG/ML IJ SOLN
1.0000 mg | Freq: Once | INTRAMUSCULAR | Status: AC
  Administered 2018-07-29: 1 mg via INTRAVENOUS
  Filled 2018-07-29: qty 1

## 2018-07-29 MED ORDER — ONDANSETRON HCL 4 MG/2ML IJ SOLN
4.0000 mg | Freq: Once | INTRAMUSCULAR | Status: AC
  Administered 2018-07-29: 4 mg via INTRAVENOUS
  Filled 2018-07-29: qty 2

## 2018-07-29 MED ORDER — HYDROMORPHONE HCL 1 MG/ML IJ SOLN
1.0000 mg | Freq: Once | INTRAMUSCULAR | Status: AC
  Administered 2018-07-29: 1 mg via INTRAVENOUS

## 2018-07-29 NOTE — ED Triage Notes (Signed)
Pt reports was previous followed at a pain clinic   Is no longer going there  Was here 3 days ago for same

## 2018-07-29 NOTE — Discharge Instructions (Addendum)
Meds for pain and nausea.  Clear liquids for the next 12 hours.  Follow-up with gastroenterologist in [place]
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
## 2018-07-29 NOTE — ED Triage Notes (Signed)
abd pain states is pancreas pain   Seen here mid March has not followed up   Here for abd pain

## 2018-09-16 ENCOUNTER — Observation Stay (HOSPITAL_COMMUNITY)
Admission: EM | Admit: 2018-09-16 | Discharge: 2018-09-16 | Discharge status: Against Medical Advice | Payer: Medicaid Other | Attending: Family Medicine | Admitting: Family Medicine

## 2018-09-16 ENCOUNTER — Other Ambulatory Visit: Payer: Self-pay

## 2018-09-16 ENCOUNTER — Encounter (HOSPITAL_COMMUNITY): Payer: Self-pay | Admitting: Emergency Medicine

## 2018-09-16 DIAGNOSIS — Z1159 Encounter for screening for other viral diseases: Secondary | ICD-10-CM | POA: Insufficient documentation

## 2018-09-16 DIAGNOSIS — I10 Essential (primary) hypertension: Secondary | ICD-10-CM | POA: Diagnosis not present

## 2018-09-16 DIAGNOSIS — Z720 Tobacco use: Secondary | ICD-10-CM | POA: Diagnosis present

## 2018-09-16 DIAGNOSIS — Z03818 Encounter for observation for suspected exposure to other biological agents ruled out: Secondary | ICD-10-CM | POA: Diagnosis not present

## 2018-09-16 DIAGNOSIS — K219 Gastro-esophageal reflux disease without esophagitis: Secondary | ICD-10-CM | POA: Diagnosis not present

## 2018-09-16 DIAGNOSIS — Z888 Allergy status to other drugs, medicaments and biological substances status: Secondary | ICD-10-CM | POA: Diagnosis not present

## 2018-09-16 DIAGNOSIS — Z818 Family history of other mental and behavioral disorders: Secondary | ICD-10-CM | POA: Insufficient documentation

## 2018-09-16 DIAGNOSIS — Z8249 Family history of ischemic heart disease and other diseases of the circulatory system: Secondary | ICD-10-CM | POA: Insufficient documentation

## 2018-09-16 DIAGNOSIS — R112 Nausea with vomiting, unspecified: Secondary | ICD-10-CM | POA: Diagnosis not present

## 2018-09-16 DIAGNOSIS — Z885 Allergy status to narcotic agent status: Secondary | ICD-10-CM | POA: Diagnosis not present

## 2018-09-16 DIAGNOSIS — Z79899 Other long term (current) drug therapy: Secondary | ICD-10-CM | POA: Insufficient documentation

## 2018-09-16 DIAGNOSIS — F1290 Cannabis use, unspecified, uncomplicated: Secondary | ICD-10-CM | POA: Diagnosis not present

## 2018-09-16 DIAGNOSIS — G894 Chronic pain syndrome: Secondary | ICD-10-CM | POA: Diagnosis not present

## 2018-09-16 DIAGNOSIS — F329 Major depressive disorder, single episode, unspecified: Secondary | ICD-10-CM | POA: Diagnosis not present

## 2018-09-16 DIAGNOSIS — K861 Other chronic pancreatitis: Secondary | ICD-10-CM | POA: Diagnosis present

## 2018-09-16 DIAGNOSIS — K859 Acute pancreatitis without necrosis or infection, unspecified: Secondary | ICD-10-CM

## 2018-09-16 DIAGNOSIS — F411 Generalized anxiety disorder: Secondary | ICD-10-CM | POA: Diagnosis present

## 2018-09-16 DIAGNOSIS — F17210 Nicotine dependence, cigarettes, uncomplicated: Secondary | ICD-10-CM | POA: Insufficient documentation

## 2018-09-16 DIAGNOSIS — R1013 Epigastric pain: Secondary | ICD-10-CM | POA: Diagnosis not present

## 2018-09-16 LAB — COMPREHENSIVE METABOLIC PANEL
ALT: 20 U/L (ref 0–44)
AST: 14 U/L — ABNORMAL LOW (ref 15–41)
Albumin: 3.8 g/dL (ref 3.5–5.0)
Alkaline Phosphatase: 65 U/L (ref 38–126)
Anion gap: 10 (ref 5–15)
BUN: 12 mg/dL (ref 6–20)
CO2: 22 mmol/L (ref 22–32)
Calcium: 8.9 mg/dL (ref 8.9–10.3)
Chloride: 107 mmol/L (ref 98–111)
Creatinine, Ser: 0.63 mg/dL (ref 0.44–1.00)
GFR calc Af Amer: >60 mL/min (ref >60)
GFR calc non Af Amer: >60 mL/min (ref >60)
Glucose, Bld: 118 mg/dL — ABNORMAL HIGH (ref 70–99)
Potassium: 3.6 mmol/L (ref 3.5–5.1)
Sodium: 139 mmol/L (ref 135–145)
Total Bilirubin: 0.3 mg/dL (ref 0.3–1.2)
Total Protein: 6.5 g/dL (ref 6.5–8.1)

## 2018-09-16 LAB — CBC WITH DIFFERENTIAL/PLATELET
Abs Immature Granulocytes: 0.03 10*3/uL (ref 0.00–0.07)
Basophils Absolute: 0 10*3/uL (ref 0.0–0.1)
Basophils Relative: 0 %
Eosinophils Absolute: 0.7 10*3/uL — ABNORMAL HIGH (ref 0.0–0.5)
Eosinophils Relative: 7 %
HCT: 40.6 % (ref 36.0–46.0)
Hemoglobin: 13.1 g/dL (ref 12.0–15.0)
Immature Granulocytes: 0 %
Lymphocytes Relative: 24 %
Lymphs Abs: 2.4 10*3/uL (ref 0.7–4.0)
MCH: 31 pg (ref 26.0–34.0)
MCHC: 32.3 g/dL (ref 30.0–36.0)
MCV: 96.2 fL (ref 80.0–100.0)
Monocytes Absolute: 0.7 10*3/uL (ref 0.1–1.0)
Monocytes Relative: 7 %
Neutro Abs: 6.2 10*3/uL (ref 1.7–7.7)
Neutrophils Relative %: 62 %
Platelets: 279 10*3/uL (ref 150–400)
RBC: 4.22 MIL/uL (ref 3.87–5.11)
RDW: 14.1 % (ref 11.5–15.5)
WBC: 10 10*3/uL (ref 4.0–10.5)
nRBC: 0 % (ref 0.0–0.2)

## 2018-09-16 LAB — HEMOGLOBIN A1C
Hgb A1c MFr Bld: 5.8 % — ABNORMAL HIGH (ref 4.8–5.6)
Mean Plasma Glucose: 119.76 mg/dL

## 2018-09-16 LAB — HCG, QUANTITATIVE, PREGNANCY: hCG, Beta Chain, Quant, S: 1 m[IU]/mL (ref <5)

## 2018-09-16 LAB — SARS CORONAVIRUS 2 BY RT PCR (HOSPITAL ORDER, PERFORMED IN ~~LOC~~ HOSPITAL LAB): SARS Coronavirus 2: NEGATIVE

## 2018-09-16 LAB — LIPASE, BLOOD: Lipase: 157 U/L — ABNORMAL HIGH (ref 11–51)

## 2018-09-16 MED ORDER — FAMOTIDINE IN NACL 20-0.9 MG/50ML-% IV SOLN
20.0000 mg | Freq: Once | INTRAVENOUS | Status: AC
  Administered 2018-09-16: 20 mg via INTRAVENOUS
  Filled 2018-09-16: qty 50

## 2018-09-16 MED ORDER — SODIUM CHLORIDE 0.9 % IV BOLUS (SEPSIS)
1000.0000 mL | Freq: Once | INTRAVENOUS | Status: AC
  Administered 2018-09-16: 07:00:00 1000 mL via INTRAVENOUS

## 2018-09-16 MED ORDER — ONDANSETRON 8 MG PO TBDP
8.0000 mg | ORAL_TABLET | Freq: Once | ORAL | Status: AC
  Administered 2018-09-16: 8 mg via ORAL
  Filled 2018-09-16: qty 1

## 2018-09-16 MED ORDER — ONDANSETRON HCL 4 MG/2ML IJ SOLN
4.0000 mg | Freq: Four times a day (QID) | INTRAMUSCULAR | Status: DC | PRN
  Administered 2018-09-16: 4 mg via INTRAVENOUS

## 2018-09-16 MED ORDER — ACETAMINOPHEN 325 MG PO TABS
650.0000 mg | ORAL_TABLET | Freq: Once | ORAL | Status: AC
  Administered 2018-09-16: 650 mg via ORAL
  Filled 2018-09-16: qty 2

## 2018-09-16 MED ORDER — LIDOCAINE VISCOUS HCL 2 % MT SOLN
15.0000 mL | Freq: Once | OROMUCOSAL | Status: AC
  Administered 2018-09-16: 15 mL via ORAL
  Filled 2018-09-16: qty 15

## 2018-09-16 MED ORDER — ALUM & MAG HYDROXIDE-SIMETH 200-200-20 MG/5ML PO SUSP
30.0000 mL | Freq: Once | ORAL | Status: AC
  Administered 2018-09-16: 30 mL via ORAL
  Filled 2018-09-16: qty 30

## 2018-09-16 MED ORDER — LORAZEPAM 2 MG/ML IJ SOLN
1.0000 mg | Freq: Once | INTRAMUSCULAR | Status: AC
  Administered 2018-09-16: 1 mg via INTRAVENOUS
  Filled 2018-09-16: qty 1

## 2018-09-16 MED ORDER — ONDANSETRON HCL 4 MG/2ML IJ SOLN
INTRAMUSCULAR | Status: AC
  Filled 2018-09-16: qty 2

## 2018-09-16 MED ORDER — ONDANSETRON HCL 4 MG/2ML IJ SOLN
4.0000 mg | Freq: Once | INTRAMUSCULAR | Status: AC
  Administered 2018-09-16: 4 mg via INTRAVENOUS
  Filled 2018-09-16: qty 2

## 2018-09-16 MED ORDER — PROMETHAZINE HCL 25 MG/ML IJ SOLN
12.5000 mg | Freq: Once | INTRAMUSCULAR | Status: AC
  Administered 2018-09-16: 12.5 mg via INTRAVENOUS
  Filled 2018-09-16: qty 1

## 2018-09-16 MED ORDER — ONDANSETRON HCL 4 MG/2ML IJ SOLN
4.0000 mg | Freq: Once | INTRAMUSCULAR | Status: AC
  Administered 2018-09-16: 09:00:00 4 mg via INTRAVENOUS
  Filled 2018-09-16: qty 2

## 2018-09-16 MED ORDER — ONDANSETRON HCL 4 MG PO TABS: 4.0000 mg | ORAL_TABLET | Freq: Four times a day (QID) | ORAL | Status: DC | PRN
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
## 2018-09-16 NOTE — ED Provider Notes (Signed)
Pt received at sign out with PO challenge pending. See previous EDP note for full HPI/H&P/MDM. Pt is well known to ED for chronic abd pain, and was made aware previous shift that she would not be receiving narcotics. Pt now escalating since shift change (pt has new staff now): stating "no one" is paying attention to her, listening to her, treating her pain. Pt reminded regarding her care plan and previous shift's discussion with her regarding no narcotics. Pt then quickly stated that "oh that's not what I'm asking for." When asked what she then was asking for, she then said she wanted "pain medicine" and "something for pain." Pt reminded she received tylenol and ativan. Pt offered GI cocktail and was given this PO without any N/V. Pt now again calling myself and Charge RN in room, repeating above again. When she again was asked what she wanted; it "was something for pain." IV pepcid ordered. Pt then asked for more nausea meds; IV zofran and IV phenergan ordered.   1200:  Pt continues to call out for ED RN and I, stating she is "still vomiting" and "needs pain medicine." Pt given multiple doses of meds apparently without change. Do not feel pt needs imaging study at this time as, other than elevated lipase, labs are reassuring and VS remain stable. T/C returned from [facility]. Laural Benes, case discussed, including:  HPI, pertinent PM/SHx, VS/PE, dx testing, ED course and treatment:  Agreeable to come to ED for evaluation for admission.     Samuel Jester, DO 09/16/18 1224

## 2018-09-16 NOTE — ED Triage Notes (Signed)
Pt c/o abd pain that woke her up about midnight tonight, has vomited 4 times since. Pt states she has chronic pancreatitis

## 2018-09-16 NOTE — ED Notes (Signed)
Pt had removed her IV and states she is leaving.  Pt has been talking about leaving due to "not being taken care of " due to not getting any pain medication.  Pt has a care plan in place.

## 2019-03-06 ENCOUNTER — Other Ambulatory Visit: Payer: Self-pay

## 2019-03-06 ENCOUNTER — Ambulatory Visit (HOSPITAL_COMMUNITY): Payer: Medicaid Other | Admitting: Psychiatry

## 2019-03-06 ENCOUNTER — Telehealth (HOSPITAL_COMMUNITY): Payer: Self-pay | Admitting: Psychiatry

## 2019-03-06 NOTE — Telephone Encounter (Signed)
Therapist attempted to contact patient twice for scheduled appointment via text through [email or web address] platform, no response.  Therapist called patient and left message indicating attempt and requesting patient call office.

## 2019-03-15 ENCOUNTER — Encounter (HOSPITAL_COMMUNITY): Payer: Self-pay

## 2019-03-15 ENCOUNTER — Emergency Department (HOSPITAL_COMMUNITY)
Admission: EM | Admit: 2019-03-15 | Discharge: 2019-03-15 | Disposition: A | Discharge status: Home / Self Care | Payer: Medicaid Other | Attending: Emergency Medicine | Admitting: Emergency Medicine

## 2019-03-15 ENCOUNTER — Other Ambulatory Visit: Payer: Self-pay

## 2019-03-15 DIAGNOSIS — Z113 Encounter for screening for infections with a predominantly sexual mode of transmission: Secondary | ICD-10-CM | POA: Insufficient documentation

## 2019-03-15 DIAGNOSIS — F17210 Nicotine dependence, cigarettes, uncomplicated: Secondary | ICD-10-CM | POA: Diagnosis not present

## 2019-03-15 DIAGNOSIS — I10 Essential (primary) hypertension: Secondary | ICD-10-CM | POA: Diagnosis not present

## 2019-03-15 DIAGNOSIS — Z711 Person with feared health complaint in whom no diagnosis is made: Secondary | ICD-10-CM

## 2019-03-15 DIAGNOSIS — Z202 Contact with and (suspected) exposure to infections with a predominantly sexual mode of transmission: Secondary | ICD-10-CM | POA: Diagnosis not present

## 2019-03-15 LAB — URINALYSIS, ROUTINE W REFLEX MICROSCOPIC
Bilirubin Urine: NEGATIVE
Glucose, UA: NEGATIVE mg/dL
Hgb urine dipstick: NEGATIVE
Ketones, ur: NEGATIVE mg/dL
Leukocytes,Ua: NEGATIVE
Nitrite: NEGATIVE
Protein, ur: NEGATIVE mg/dL
Specific Gravity, Urine: 1.027 (ref 1.005–1.030)
pH: 5 (ref 5.0–8.0)

## 2019-03-15 LAB — PREGNANCY, URINE: Preg Test, Ur: NEGATIVE
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
## 2019-03-19 LAB — GC/CHLAMYDIA PROBE AMP (~~LOC~~) NOT AT ARMC
Chlamydia: NEGATIVE
Neisseria Gonorrhea: NEGATIVE

## 2019-06-19 ENCOUNTER — Emergency Department (HOSPITAL_COMMUNITY)
Admission: EM | Admit: 2019-06-19 | Discharge: 2019-06-19 | Discharge status: Against Medical Advice | Payer: Medicaid Other | Attending: Emergency Medicine | Admitting: Emergency Medicine

## 2019-06-19 ENCOUNTER — Other Ambulatory Visit: Payer: Self-pay

## 2019-06-19 ENCOUNTER — Encounter (HOSPITAL_COMMUNITY): Payer: Self-pay | Admitting: Emergency Medicine

## 2019-06-19 DIAGNOSIS — F17210 Nicotine dependence, cigarettes, uncomplicated: Secondary | ICD-10-CM | POA: Insufficient documentation

## 2019-06-19 DIAGNOSIS — R112 Nausea with vomiting, unspecified: Secondary | ICD-10-CM | POA: Diagnosis not present

## 2019-06-19 DIAGNOSIS — R1084 Generalized abdominal pain: Secondary | ICD-10-CM | POA: Diagnosis not present

## 2019-06-19 DIAGNOSIS — Z5320 Procedure and treatment not carried out because of patient's decision for unspecified reasons: Secondary | ICD-10-CM | POA: Diagnosis not present

## 2019-06-19 DIAGNOSIS — I10 Essential (primary) hypertension: Secondary | ICD-10-CM | POA: Insufficient documentation

## 2019-06-19 DIAGNOSIS — R109 Unspecified abdominal pain: Secondary | ICD-10-CM | POA: Diagnosis present

## 2019-06-19 DIAGNOSIS — F1210 Cannabis abuse, uncomplicated: Secondary | ICD-10-CM | POA: Diagnosis not present

## 2019-06-19 MED ORDER — ACETAMINOPHEN 325 MG PO TABS
650.0000 mg | ORAL_TABLET | Freq: Once | ORAL | Status: AC
  Administered 2019-06-19: 650 mg via ORAL
  Filled 2019-06-19: qty 2

## 2019-06-19 MED ORDER — ONDANSETRON 8 MG PO TBDP
8.0000 mg | ORAL_TABLET | Freq: Once | ORAL | Status: AC
  Administered 2019-06-19: 8 mg via ORAL
  Filled 2019-06-19: qty 1

## 2019-06-19 MED ORDER — SODIUM CHLORIDE 0.9 % IV BOLUS: 1000.0000 mL | Freq: Once | INTRAVENOUS | Status: DC

## 2019-06-19 NOTE — ED Triage Notes (Signed)
Pt c/o abd pain with n/v x 2 hours.

## 2019-06-19 NOTE — ED Notes (Signed)
Pt not in treatment room or bathroom at this time.
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
## 2019-09-05 ENCOUNTER — Encounter: Payer: Medicaid Other | Admitting: Adult Health

## 2019-10-08 ENCOUNTER — Ambulatory Visit: Payer: Medicaid Other | Admitting: Obstetrics & Gynecology

## 2019-10-30 DIAGNOSIS — S36220A Contusion of head of pancreas, initial encounter: Secondary | ICD-10-CM | POA: Diagnosis not present

## 2019-10-30 DIAGNOSIS — K2970 Gastritis, unspecified, without bleeding: Secondary | ICD-10-CM | POA: Diagnosis not present

## 2019-10-30 DIAGNOSIS — M25561 Pain in right knee: Secondary | ICD-10-CM | POA: Diagnosis not present

## 2019-10-30 DIAGNOSIS — Z681 Body mass index (BMI) 19 or less, adult: Secondary | ICD-10-CM | POA: Diagnosis not present

## 2019-11-05 ENCOUNTER — Encounter: Payer: Self-pay | Admitting: Adult Health

## 2019-11-05 ENCOUNTER — Ambulatory Visit (INDEPENDENT_AMBULATORY_CARE_PROVIDER_SITE_OTHER): Payer: Medicaid Other | Admitting: Adult Health

## 2019-11-05 VITALS — BP 106/63 | HR 102 | Ht 65.0 in | Wt 112.0 lb

## 2019-11-05 DIAGNOSIS — Z3202 Encounter for pregnancy test, result negative: Secondary | ICD-10-CM | POA: Diagnosis not present

## 2019-11-05 DIAGNOSIS — N926 Irregular menstruation, unspecified: Secondary | ICD-10-CM | POA: Diagnosis not present

## 2019-11-05 LAB — POCT URINE PREGNANCY: Preg Test, Ur: NEGATIVE

## 2019-11-05 NOTE — Progress Notes (Signed)
  Subjective:     Patient ID: [name], female   DOB: 08/28/1985, 34 y.o.   MRN: [id number]  HPI Carlyle is a 34 year old white female, divorced, in for UPT, she had tubal 2017, and has missed several periods and had +HPT, then spotted and UPT negative today. PCP is Dr Olena Leatherwood   Review of Systems Missed periods Has gained some weight  Reviewed past medical,surgical, social and family history. Reviewed medications and allergies.     Objective:   Physical Exam BP 106/63 (BP Location: Left Arm, Patient Position: Sitting, Cuff Size: Normal)   Pulse (!) 102   Ht 5\' 5"  (1.651 m)   Wt 112 lb (50.8 kg)   LMP 08/13/2019 (Within Weeks)   BMI 18.64 kg/m UPT is negative. Skin warm and dry. Neck: mid line trachea, normal thyroid, good ROM, no lymphadenopathy noted. Lungs: clear to ausculation bilaterally. Cardiovascular: regular rate and rhythm.   AA is 0  Fall risk is low ]PHQ 9 score is 12, no SI, has appt with PCP  [facility] - 11/05/19 1427      Pregnancy Intention Screening   Does the patient want to become pregnant in the next year? No    Does the patient's partner want to become pregnant in the next year? No    Would the patient like to discuss contraceptive options today? No      Contraception Wrap Up   Current Method Female Sterilization    End Method Female Sterilization    Contraception Counseling Provided No          Assessment:     1. Missed periods Check QHCG and TSH, will talk in am    Plan:     Follow up prn, or if she wants 11/07/19 to perform her pap.

## 2019-11-06 ENCOUNTER — Telehealth: Payer: Self-pay | Admitting: Adult Health

## 2019-11-06 LAB — BETA HCG QUANT (REF LAB): hCG Quant: <1 m[IU]/mL

## 2019-11-06 LAB — TSH: TSH: 1.06 u[IU]/mL (ref 0.450–4.500)

## 2019-11-06 NOTE — Telephone Encounter (Signed)
Pt is calling to get results of lab results

## 2019-11-06 NOTE — Telephone Encounter (Signed)
Spoke with patient. Advised that hcg is negative and thyroid is normal. Patient states that she plans to f/u with her pcp. Told her that if she needed anything else from [place] to let us know.

## 2019-11-24 ENCOUNTER — Other Ambulatory Visit: Payer: Self-pay

## 2019-11-24 ENCOUNTER — Encounter (HOSPITAL_COMMUNITY): Payer: Self-pay

## 2019-11-24 ENCOUNTER — Emergency Department (HOSPITAL_COMMUNITY): Payer: Medicaid Other

## 2019-11-24 ENCOUNTER — Emergency Department (HOSPITAL_COMMUNITY)
Admission: EM | Admit: 2019-11-24 | Discharge: 2019-11-24 | Disposition: A | Discharge status: Home / Self Care | Payer: Medicaid Other | Attending: Emergency Medicine | Admitting: Emergency Medicine

## 2019-11-24 DIAGNOSIS — R1010 Upper abdominal pain, unspecified: Secondary | ICD-10-CM

## 2019-11-24 DIAGNOSIS — I10 Essential (primary) hypertension: Secondary | ICD-10-CM | POA: Insufficient documentation

## 2019-11-24 DIAGNOSIS — F17210 Nicotine dependence, cigarettes, uncomplicated: Secondary | ICD-10-CM | POA: Insufficient documentation

## 2019-11-24 DIAGNOSIS — K861 Other chronic pancreatitis: Secondary | ICD-10-CM | POA: Diagnosis not present

## 2019-11-24 DIAGNOSIS — Z9851 Tubal ligation status: Secondary | ICD-10-CM | POA: Diagnosis not present

## 2019-11-24 DIAGNOSIS — K219 Gastro-esophageal reflux disease without esophagitis: Secondary | ICD-10-CM | POA: Diagnosis not present

## 2019-11-24 DIAGNOSIS — R197 Diarrhea, unspecified: Secondary | ICD-10-CM | POA: Diagnosis not present

## 2019-11-24 DIAGNOSIS — R1013 Epigastric pain: Secondary | ICD-10-CM | POA: Insufficient documentation

## 2019-11-24 DIAGNOSIS — R109 Unspecified abdominal pain: Secondary | ICD-10-CM | POA: Diagnosis present

## 2019-11-24 DIAGNOSIS — R1110 Vomiting, unspecified: Secondary | ICD-10-CM | POA: Diagnosis not present

## 2019-11-24 LAB — COMPREHENSIVE METABOLIC PANEL
ALT: 20 U/L (ref 0–44)
AST: 19 U/L (ref 15–41)
Albumin: 4.4 g/dL (ref 3.5–5.0)
Alkaline Phosphatase: 92 U/L (ref 38–126)
Anion gap: 9 (ref 5–15)
BUN: 9 mg/dL (ref 6–20)
CO2: 25 mmol/L (ref 22–32)
Calcium: 9.4 mg/dL (ref 8.9–10.3)
Chloride: 103 mmol/L (ref 98–111)
Creatinine, Ser: 0.6 mg/dL (ref 0.44–1.00)
GFR calc Af Amer: >60 mL/min (ref >60)
GFR calc non Af Amer: >60 mL/min (ref >60)
Glucose, Bld: 134 mg/dL — ABNORMAL HIGH (ref 70–99)
Potassium: 3.8 mmol/L (ref 3.5–5.1)
Sodium: 137 mmol/L (ref 135–145)
Total Bilirubin: 0.4 mg/dL (ref 0.3–1.2)
Total Protein: 7.5 g/dL (ref 6.5–8.1)

## 2019-11-24 LAB — CBC WITH DIFFERENTIAL/PLATELET
Abs Immature Granulocytes: 0.01 10*3/uL (ref 0.00–0.07)
Basophils Absolute: 0.1 10*3/uL (ref 0.0–0.1)
Basophils Relative: 1 %
Eosinophils Absolute: 0.4 10*3/uL (ref 0.0–0.5)
Eosinophils Relative: 4 %
HCT: 41 % (ref 36.0–46.0)
Hemoglobin: 13.3 g/dL (ref 12.0–15.0)
Immature Granulocytes: 0 %
Lymphocytes Relative: 32 %
Lymphs Abs: 2.6 10*3/uL (ref 0.7–4.0)
MCH: 29.6 pg (ref 26.0–34.0)
MCHC: 32.4 g/dL (ref 30.0–36.0)
MCV: 91.1 fL (ref 80.0–100.0)
Monocytes Absolute: 0.6 10*3/uL (ref 0.1–1.0)
Monocytes Relative: 8 %
Neutro Abs: 4.5 10*3/uL (ref 1.7–7.7)
Neutrophils Relative %: 55 %
Platelets: 413 10*3/uL — ABNORMAL HIGH (ref 150–400)
RBC: 4.5 MIL/uL (ref 3.87–5.11)
RDW: 16.8 % — ABNORMAL HIGH (ref 11.5–15.5)
WBC: 8.2 10*3/uL (ref 4.0–10.5)
nRBC: 0 % (ref 0.0–0.2)

## 2019-11-24 LAB — LIPASE, BLOOD: Lipase: 21 U/L (ref 11–51)

## 2019-11-24 LAB — HCG, QUANTITATIVE, PREGNANCY: hCG, Beta Chain, Quant, S: 1 m[IU]/mL (ref <5)

## 2019-11-24 MED ORDER — HYDROCODONE-ACETAMINOPHEN 5-325 MG PO TABS: 1.0000 | ORAL_TABLET | Freq: Four times a day (QID) | ORAL | 0 refills | Status: DC | PRN

## 2019-11-24 MED ORDER — HYDROMORPHONE HCL 1 MG/ML IJ SOLN
1.0000 mg | Freq: Once | INTRAMUSCULAR | Status: AC
  Administered 2019-11-24: 1 mg via INTRAVENOUS
  Filled 2019-11-24: qty 1

## 2019-11-24 MED ORDER — ONDANSETRON HCL 4 MG/2ML IJ SOLN
4.0000 mg | Freq: Once | INTRAMUSCULAR | Status: AC
  Administered 2019-11-24: 4 mg via INTRAVENOUS
  Filled 2019-11-24: qty 2

## 2019-11-24 MED ORDER — ONDANSETRON 4 MG PO TBDP: ORAL_TABLET | ORAL | 0 refills | Status: DC

## 2019-11-24 MED ORDER — SODIUM CHLORIDE 0.9 % IV BOLUS
1000.0000 mL | Freq: Once | INTRAVENOUS | Status: AC
  Administered 2019-11-24: 1000 mL via INTRAVENOUS

## 2019-11-24 MED ORDER — LORAZEPAM 2 MG/ML IJ SOLN
0.5000 mg | Freq: Once | INTRAMUSCULAR | Status: AC
  Administered 2019-11-24: 0.5 mg via INTRAVENOUS
  Filled 2019-11-24: qty 1

## 2019-11-24 MED ORDER — PANTOPRAZOLE SODIUM 40 MG IV SOLR
40.0000 mg | Freq: Once | INTRAVENOUS | Status: AC
  Administered 2019-11-24: 40 mg via INTRAVENOUS
  Filled 2019-11-24: qty 40

## 2019-11-24 MED ORDER — IOHEXOL 300 MG/ML  SOLN
100.0000 mL | Freq: Once | INTRAMUSCULAR | Status: AC | PRN
  Administered 2019-11-24: 100 mL via INTRAVENOUS

## 2019-11-24 NOTE — Discharge Instructions (Signed)
Increase your Protonix so you are taking it twice a day for 3 to 4 days.  Take liquids only for the next 24 to 48 hours and follow-up with your primary care doctor or the gastroenterologist
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
## 2019-11-24 NOTE — ED Triage Notes (Signed)
Pt presents to ED with complaints of chronic abdominal pain and vomiting. Pt states she has a history of pancreatitis.

## 2019-11-26 ENCOUNTER — Telehealth: Payer: Self-pay | Admitting: *Deleted

## 2019-11-26 NOTE — Telephone Encounter (Signed)
Medicaid Managed Care team Transition of Care Assessment outreach attempt #1 made today. Unable to reach patient. HIPPA compliant voice message left requesting a return call. The patient has also been enrolled in an automated discharge follow up call series and will receive two outreach attempts for transition of care assessment. Contact information has been left for the patient and the Medicaid Managed Care team is available to provide assistance to the patient at any time.  ° °Katrice Kayne Yuhas, RN, BSN, CCRN °Patient Engagement Center °[phone] ° °

## 2019-11-27 ENCOUNTER — Telehealth: Payer: Self-pay | Admitting: *Deleted

## 2019-11-27 NOTE — Telephone Encounter (Signed)
2nd attempt to contact pt to complete transition of care assessment:  Transition Care Management Follow-up Telephone Call  . Medicaid Managed Care Transition Call Status:MM [facility] Call Made  . Date of discharge and from where: [facility], 11/24/19 . How have you been since you were released from the hospital? "better, still tender near pancreas"  . Any questions or concerns? No  Items Reviewed: Marland Kitchen Did the pt receive and understand the discharge instructions provided? Yes  . Medications obtained and verified? Yes  . Any new allergies since your discharge? No  . Dietary orders reviewed? Yes; per pt. pancreatic diet per MD at [facility] . Do you have support at home?  Yes, family  Functional Questionnaire: (I = Independent and D = Dependent)  ADLs: Independent Bathing/Dressing:Independent Meal Prep: Independent Eating: Independent Maintaining continence: Independent Transferring/Ambulation: Independent Managing Meds: Independent   Follow up appointments reviewed:  PCP Hospital f/u appt confirmed? Yes pt states she is to see here PCP at the end of the month   Pt to call Dr Karilyn Cota on 11/27/19 to schedule follw up appt  Specialist Hospital f/u appt confirmed?  Are transportation arrangements needed? No   If their condition worsens, is the pt aware to call PCP or go to the EmergencyDept.?yes  Was the patient provided with contact information for the PCP's office or ED? yes  Was to pt encouraged to call back with questions or concerns? yes  Burnard Bunting, RN, BSN, CCRN Patient Engagement Center [phone]

## 2019-12-03 DIAGNOSIS — K861 Other chronic pancreatitis: Secondary | ICD-10-CM | POA: Diagnosis not present

## 2019-12-03 DIAGNOSIS — K219 Gastro-esophageal reflux disease without esophagitis: Secondary | ICD-10-CM | POA: Diagnosis not present

## 2019-12-03 DIAGNOSIS — Z681 Body mass index (BMI) 19 or less, adult: Secondary | ICD-10-CM | POA: Diagnosis not present

## 2020-03-04 DIAGNOSIS — Z681 Body mass index (BMI) 19 or less, adult: Secondary | ICD-10-CM | POA: Diagnosis not present

## 2020-03-04 DIAGNOSIS — N912 Amenorrhea, unspecified: Secondary | ICD-10-CM | POA: Diagnosis not present

## 2020-03-06 ENCOUNTER — Other Ambulatory Visit: Payer: Self-pay

## 2020-03-06 ENCOUNTER — Emergency Department (HOSPITAL_COMMUNITY)
Admission: EM | Admit: 2020-03-06 | Discharge: 2020-03-06 | Disposition: A | Discharge status: Home / Self Care | Payer: Medicaid Other | Attending: Emergency Medicine | Admitting: Emergency Medicine

## 2020-03-06 ENCOUNTER — Encounter (HOSPITAL_COMMUNITY): Payer: Self-pay | Admitting: *Deleted

## 2020-03-06 DIAGNOSIS — R109 Unspecified abdominal pain: Secondary | ICD-10-CM | POA: Insufficient documentation

## 2020-03-06 DIAGNOSIS — F17210 Nicotine dependence, cigarettes, uncomplicated: Secondary | ICD-10-CM | POA: Diagnosis not present

## 2020-03-06 DIAGNOSIS — Z79899 Other long term (current) drug therapy: Secondary | ICD-10-CM | POA: Insufficient documentation

## 2020-03-06 DIAGNOSIS — R110 Nausea: Secondary | ICD-10-CM | POA: Insufficient documentation

## 2020-03-06 DIAGNOSIS — I10 Essential (primary) hypertension: Secondary | ICD-10-CM | POA: Diagnosis not present

## 2020-03-06 DIAGNOSIS — R1013 Epigastric pain: Secondary | ICD-10-CM | POA: Diagnosis not present

## 2020-03-06 LAB — COMPREHENSIVE METABOLIC PANEL
ALT: 28 U/L (ref 0–44)
AST: 21 U/L (ref 15–41)
Albumin: 4.5 g/dL (ref 3.5–5.0)
Alkaline Phosphatase: 80 U/L (ref 38–126)
Anion gap: 8 (ref 5–15)
BUN: 11 mg/dL (ref 6–20)
CO2: 25 mmol/L (ref 22–32)
Calcium: 9.3 mg/dL (ref 8.9–10.3)
Chloride: 103 mmol/L (ref 98–111)
Creatinine, Ser: 0.63 mg/dL (ref 0.44–1.00)
GFR, Estimated: >60 mL/min (ref >60)
Glucose, Bld: 139 mg/dL — ABNORMAL HIGH (ref 70–99)
Potassium: 3.4 mmol/L — ABNORMAL LOW (ref 3.5–5.1)
Sodium: 136 mmol/L (ref 135–145)
Total Bilirubin: 0.4 mg/dL (ref 0.3–1.2)
Total Protein: 7.8 g/dL (ref 6.5–8.1)

## 2020-03-06 LAB — CBC WITH DIFFERENTIAL/PLATELET
Abs Immature Granulocytes: 0.02 10*3/uL (ref 0.00–0.07)
Basophils Absolute: 0.1 10*3/uL (ref 0.0–0.1)
Basophils Relative: 1 %
Eosinophils Absolute: 0.5 10*3/uL (ref 0.0–0.5)
Eosinophils Relative: 5 %
HCT: 39 % (ref 36.0–46.0)
Hemoglobin: 13 g/dL (ref 12.0–15.0)
Immature Granulocytes: 0 %
Lymphocytes Relative: 32 %
Lymphs Abs: 3.1 10*3/uL (ref 0.7–4.0)
MCH: 30.8 pg (ref 26.0–34.0)
MCHC: 33.3 g/dL (ref 30.0–36.0)
MCV: 92.4 fL (ref 80.0–100.0)
Monocytes Absolute: 0.7 10*3/uL (ref 0.1–1.0)
Monocytes Relative: 7 %
Neutro Abs: 5.3 10*3/uL (ref 1.7–7.7)
Neutrophils Relative %: 55 %
Platelets: 383 10*3/uL (ref 150–400)
RBC: 4.22 MIL/uL (ref 3.87–5.11)
RDW: 15.1 % (ref 11.5–15.5)
WBC: 9.6 10*3/uL (ref 4.0–10.5)
nRBC: 0 % (ref 0.0–0.2)

## 2020-03-06 LAB — LIPASE, BLOOD: Lipase: 26 U/L (ref 11–51)

## 2020-03-06 MED ORDER — HYDROMORPHONE HCL 1 MG/ML IJ SOLN
0.5000 mg | Freq: Once | INTRAMUSCULAR | Status: AC
  Administered 2020-03-06: 0.5 mg via INTRAVENOUS
  Filled 2020-03-06: qty 1

## 2020-03-06 MED ORDER — ONDANSETRON 4 MG PO TBDP: 4.0000 mg | ORAL_TABLET | Freq: Three times a day (TID) | ORAL | 0 refills | Status: DC | PRN

## 2020-03-06 MED ORDER — PROMETHAZINE HCL 25 MG/ML IJ SOLN
25.0000 mg | Freq: Once | INTRAMUSCULAR | Status: AC
  Administered 2020-03-06: 25 mg via INTRAVENOUS
  Filled 2020-03-06: qty 1

## 2020-03-06 MED ORDER — HYDROMORPHONE HCL 1 MG/ML IJ SOLN
1.0000 mg | Freq: Once | INTRAMUSCULAR | Status: AC
  Administered 2020-03-06: 1 mg via INTRAVENOUS
  Filled 2020-03-06: qty 1

## 2020-03-06 MED ORDER — LORAZEPAM 2 MG/ML IJ SOLN
0.5000 mg | Freq: Once | INTRAMUSCULAR | Status: AC
  Administered 2020-03-06: 0.5 mg via INTRAVENOUS
  Filled 2020-03-06: qty 1

## 2020-03-06 MED ORDER — SODIUM CHLORIDE 0.9 % IV BOLUS
1000.0000 mL | Freq: Once | INTRAVENOUS | Status: AC
  Administered 2020-03-06: 1000 mL via INTRAVENOUS

## 2020-03-06 MED ORDER — PANTOPRAZOLE SODIUM 40 MG IV SOLR
40.0000 mg | Freq: Once | INTRAVENOUS | Status: AC
  Administered 2020-03-06: 40 mg via INTRAVENOUS
  Filled 2020-03-06: qty 40

## 2020-03-06 MED ORDER — LIDOCAINE 5 % EX PTCH
1.0000 | MEDICATED_PATCH | Freq: Once | CUTANEOUS | Status: DC
  Administered 2020-03-06: 1 via TRANSDERMAL
  Filled 2020-03-06: qty 1

## 2020-03-06 MED ORDER — ONDANSETRON HCL 4 MG/2ML IJ SOLN
4.0000 mg | Freq: Once | INTRAMUSCULAR | Status: AC
  Administered 2020-03-06: 4 mg via INTRAVENOUS
  Filled 2020-03-06: qty 2

## 2020-03-06 NOTE — ED Triage Notes (Signed)
Pt with abd pain, pt with hx of pancreatitis and pain is the same as before per pt.  + Nausea, pain started few hours ago.

## 2020-03-06 NOTE — ED Notes (Signed)
Family and patient frequently reporting pain at this time. PA made aware and on rounding found patient to be in fetal position, rocking and uncomfortable. Pt reports a small improvement in pain and discomfort, but is still in pain. This RN verbalized understanding at this time.

## 2020-03-06 NOTE — ED Notes (Addendum)
PA to bedside for assessment.

## 2020-03-06 NOTE — ED Notes (Signed)
Pt provided with a hot pack per request at this time. Educated on current plan of care, and is in agreement. Will continue to monitor.

## 2020-03-06 NOTE — ED Notes (Signed)
This RN educated by staff that patient is reporting severe pain. PA made aware and new orders obtained at this time.
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
## 2020-03-06 NOTE — Discharge Instructions (Addendum)
Prescription sent to your pharmacy for Zofran.  This is to treat your nausea.  Take if needed.   Over the next few days you can help your pain by lying to only drink fluids.  Once your pain is controlled you can try to advance to a bland diet.  Ultimately you need to follow-up with GI as we discussed.  You should call [facility] tomorrow morning and try to schedule the next available appointment.  Return to the emergency department for any new or worsening symptoms.  Thank you for allowing [place] to care for you today.

## 2020-03-06 NOTE — ED Notes (Signed)
Pt provided with discharge education and all questions and concerns voiced addressed at this time. 

## 2020-03-06 NOTE — ED Notes (Signed)
While away from bedside, this RN was informed by staff that patient is reporting severe abdominal. PA made aware at this time. On rounding and assessment, patient reports that pain medication improved pain to a 5/10, but is now currently a 7/10. Pt appears uncomfortable in bed at this time.

## 2020-03-06 NOTE — ED Notes (Signed)
Entered room and introduced self to patient. Pt appears uncomfortable in bed, restless and tearful. Bed is locked in the lowest position, side rails left down at patient request. Call light within reach, educated on hourly rounding and call light use and is in agreement at this time. All questions and concerns voiced addressed at this time.

## 2020-03-07 ENCOUNTER — Telehealth: Payer: Self-pay

## 2020-03-07 NOTE — Telephone Encounter (Signed)
Transition Care Management Unsuccessful Follow-up Telephone Call  Date of discharge and from where:  03/06/2020 [name]  Attempts:  1st Attempt  Reason for unsuccessful TCM follow-up call:  Left voice message

## 2020-03-10 NOTE — Telephone Encounter (Signed)
Transition Care Management Follow-up Telephone Call  Date of discharge and from where: 03/06/[facility]  How have you been since you were released from the hospital? Feeling a lot better. Nausea and abdominal pain have resolved.   Any questions or concerns? No  Items Reviewed:  Did the pt receive and understand the discharge instructions provided? No   Medications obtained and verified? Yes   Other? No   Any new allergies since your discharge? No   Dietary orders reviewed? Yes  Do you have support at home? Yes   Home Care and Equipment/Supplies: Were home health services ordered? not applicable If so, what is the name of the agency? na  Has the agency set up a time to come to the patient's home? not applicable Were any new equipment or medical supplies ordered?  No What is the name of the medical supply agency? na Were you able to get the supplies/equipment? not applicable Do you have any questions related to the use of the equipment or supplies? No  Functional Questionnaire: (I = Independent and D = Dependent) ADLs: I  Bathing/Dressing- I  Meal Prep- I  Eating- I  Maintaining continence- I  Transferring/Ambulation- I  Managing Meds- I  Follow up appointments reviewed:   PCP Hospital f/u appt confirmed? No  Patient to set up appointment after Thanksgiving.   Specialist Hospital f/u appt confirmed? No    Are transportation arrangements needed? No   If their condition worsens, is the pt aware to call PCP or go to the Emergency Dept.? Yes  Was the patient provided with contact information for the PCP's office or ED? Yes  Was to pt encouraged to call back with questions or concerns? Yes

## 2020-04-25 ENCOUNTER — Other Ambulatory Visit: Payer: Self-pay

## 2020-04-25 ENCOUNTER — Encounter (HOSPITAL_COMMUNITY): Payer: Self-pay

## 2020-04-25 ENCOUNTER — Emergency Department (HOSPITAL_COMMUNITY)
Admission: EM | Admit: 2020-04-25 | Discharge: 2020-04-25 | Disposition: A | Discharge status: Home / Self Care | Payer: Medicaid Other | Attending: Emergency Medicine | Admitting: Emergency Medicine

## 2020-04-25 DIAGNOSIS — Z5321 Procedure and treatment not carried out due to patient leaving prior to being seen by health care provider: Secondary | ICD-10-CM | POA: Diagnosis not present

## 2020-04-25 DIAGNOSIS — R109 Unspecified abdominal pain: Secondary | ICD-10-CM | POA: Insufficient documentation

## 2020-04-25 DIAGNOSIS — R110 Nausea: Secondary | ICD-10-CM | POA: Insufficient documentation

## 2020-04-25 NOTE — ED Triage Notes (Signed)
Pt reports abd pain that started yesterday with nausea. Pt has not had fever. Pt it "feels like pancreatitis".

## 2020-06-04 DIAGNOSIS — Z0000 Encounter for general adult medical examination without abnormal findings: Secondary | ICD-10-CM | POA: Diagnosis not present

## 2020-06-04 DIAGNOSIS — Z6820 Body mass index (BMI) 20.0-20.9, adult: Secondary | ICD-10-CM | POA: Diagnosis not present

## 2020-07-04 ENCOUNTER — Other Ambulatory Visit: Payer: Self-pay

## 2020-07-04 ENCOUNTER — Emergency Department (HOSPITAL_COMMUNITY)
Admission: EM | Admit: 2020-07-04 | Discharge: 2020-07-04 | Disposition: A | Discharge status: Home / Self Care | Payer: Medicaid Other | Attending: Emergency Medicine | Admitting: Emergency Medicine

## 2020-07-04 ENCOUNTER — Encounter (HOSPITAL_COMMUNITY): Payer: Self-pay | Admitting: Emergency Medicine

## 2020-07-04 DIAGNOSIS — R1013 Epigastric pain: Secondary | ICD-10-CM | POA: Insufficient documentation

## 2020-07-04 DIAGNOSIS — R197 Diarrhea, unspecified: Secondary | ICD-10-CM | POA: Insufficient documentation

## 2020-07-04 DIAGNOSIS — K219 Gastro-esophageal reflux disease without esophagitis: Secondary | ICD-10-CM | POA: Insufficient documentation

## 2020-07-04 DIAGNOSIS — R112 Nausea with vomiting, unspecified: Secondary | ICD-10-CM | POA: Diagnosis not present

## 2020-07-04 DIAGNOSIS — R000 Tachycardia, unspecified: Secondary | ICD-10-CM | POA: Insufficient documentation

## 2020-07-04 DIAGNOSIS — I10 Essential (primary) hypertension: Secondary | ICD-10-CM | POA: Insufficient documentation

## 2020-07-04 DIAGNOSIS — E860 Dehydration: Secondary | ICD-10-CM | POA: Diagnosis not present

## 2020-07-04 DIAGNOSIS — R109 Unspecified abdominal pain: Secondary | ICD-10-CM | POA: Diagnosis present

## 2020-07-04 DIAGNOSIS — F17210 Nicotine dependence, cigarettes, uncomplicated: Secondary | ICD-10-CM | POA: Diagnosis not present

## 2020-07-04 LAB — CBC WITH DIFFERENTIAL/PLATELET
Abs Immature Granulocytes: 0.03 10*3/uL (ref 0.00–0.07)
Basophils Absolute: 0.1 10*3/uL (ref 0.0–0.1)
Basophils Relative: 1 %
Eosinophils Absolute: 0.5 10*3/uL (ref 0.0–0.5)
Eosinophils Relative: 5 %
HCT: 42.6 % (ref 36.0–46.0)
Hemoglobin: 14.1 g/dL (ref 12.0–15.0)
Immature Granulocytes: 0 %
Lymphocytes Relative: 35 %
Lymphs Abs: 3.6 10*3/uL (ref 0.7–4.0)
MCH: 30.3 pg (ref 26.0–34.0)
MCHC: 33.1 g/dL (ref 30.0–36.0)
MCV: 91.6 fL (ref 80.0–100.0)
Monocytes Absolute: 0.9 10*3/uL (ref 0.1–1.0)
Monocytes Relative: 9 %
Neutro Abs: 5.1 10*3/uL (ref 1.7–7.7)
Neutrophils Relative %: 50 %
Platelets: 368 10*3/uL (ref 150–400)
RBC: 4.65 MIL/uL (ref 3.87–5.11)
RDW: 14.4 % (ref 11.5–15.5)
WBC: 10.3 10*3/uL (ref 4.0–10.5)
nRBC: 0 % (ref 0.0–0.2)

## 2020-07-04 LAB — COMPREHENSIVE METABOLIC PANEL
ALT: 18 U/L (ref 0–44)
AST: 18 U/L (ref 15–41)
Albumin: 4.5 g/dL (ref 3.5–5.0)
Alkaline Phosphatase: 87 U/L (ref 38–126)
Anion gap: 10 (ref 5–15)
BUN: 12 mg/dL (ref 6–20)
CO2: 23 mmol/L (ref 22–32)
Calcium: 9.3 mg/dL (ref 8.9–10.3)
Chloride: 103 mmol/L (ref 98–111)
Creatinine, Ser: 0.66 mg/dL (ref 0.44–1.00)
GFR, Estimated: >60 mL/min (ref >60)
Glucose, Bld: 136 mg/dL — ABNORMAL HIGH (ref 70–99)
Potassium: 3.3 mmol/L — ABNORMAL LOW (ref 3.5–5.1)
Sodium: 136 mmol/L (ref 135–145)
Total Bilirubin: 0.5 mg/dL (ref 0.3–1.2)
Total Protein: 8 g/dL (ref 6.5–8.1)

## 2020-07-04 LAB — LIPASE, BLOOD: Lipase: 25 U/L (ref 11–51)

## 2020-07-04 LAB — PREGNANCY, URINE: Preg Test, Ur: NEGATIVE

## 2020-07-04 MED ORDER — SODIUM CHLORIDE 0.9 % IV BOLUS
1000.0000 mL | Freq: Once | INTRAVENOUS | Status: AC
  Administered 2020-07-04: 1000 mL via INTRAVENOUS

## 2020-07-04 MED ORDER — ONDANSETRON 4 MG PO TBDP: 4.0000 mg | ORAL_TABLET | Freq: Three times a day (TID) | ORAL | 0 refills | Status: DC | PRN

## 2020-07-04 MED ORDER — ONDANSETRON 4 MG PO TBDP
4.0000 mg | ORAL_TABLET | Freq: Once | ORAL | Status: AC
  Administered 2020-07-04: 4 mg via ORAL
  Filled 2020-07-04: qty 1

## 2020-07-04 MED ORDER — HYDROMORPHONE HCL 1 MG/ML IJ SOLN
1.0000 mg | Freq: Once | INTRAMUSCULAR | Status: AC
  Administered 2020-07-04: 1 mg via INTRAVENOUS
  Filled 2020-07-04: qty 1

## 2020-07-04 MED ORDER — ONDANSETRON HCL 4 MG/2ML IJ SOLN
4.0000 mg | INTRAMUSCULAR | Status: AC
  Administered 2020-07-04: 4 mg via INTRAVENOUS
  Filled 2020-07-04: qty 2
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
## 2020-07-04 NOTE — ED Triage Notes (Signed)
Pt c/o generalized abdominal pain with N/V/D

## 2020-07-04 NOTE — Discharge Instructions (Signed)
You may take ondansetron, 1 tablet every 6 hours as needed for nausea, drink plenty of clear liquids, please avoid eating or drinking heavy foods, fried foods or alcoholic beverages.  Emergency department for severe or worsening symptoms

## 2020-07-07 ENCOUNTER — Telehealth: Payer: Self-pay

## 2020-07-07 NOTE — Telephone Encounter (Signed)
Transition Care Management Unsuccessful Follow-up Telephone Call  Date of discharge and from where:  [facility]  Attempts:  1st Attempt  Reason for unsuccessful TCM follow-up call:  Left voice message

## 2020-07-08 NOTE — Telephone Encounter (Signed)
Transition Care Management Unsuccessful Follow-up Telephone Call  Date of discharge and from where:  07/04/2020 - [place] [facility]  Attempts:  2nd Attempt  Reason for unsuccessful TCM follow-up call:  Left voice message 

## 2020-07-09 NOTE — Telephone Encounter (Signed)
Transition Care Management Follow-up Telephone Call  Date of discharge and from where: 07/04/2020 from [facility]  How have you been since you were released from the hospital? Pt stated that she is feeling much better and has no questions or concerns.   Any questions or concerns? No  Items Reviewed:  Did the pt receive and understand the discharge instructions provided? Yes   Medications obtained and verified? Yes   Other? No   Any new allergies since your discharge? No   Dietary orders reviewed? n/a  Do you have support at home? Yes   Functional Questionnaire: (I = Independent and D = Dependent) ADLs: I  Bathing/Dressing- I  Meal Prep- I  Eating- I  Maintaining continence- I  Transferring/Ambulation- I  Managing Meds- I   Follow up appointments reviewed:   Are transportation arrangements needed? No   If their condition worsens, is the pt aware to call PCP or go to the Emergency Dept.? Yes  Was the patient provided with contact information for the PCP's office or ED? Yes  Was to pt encouraged to call back with questions or concerns? Yes

## 2020-09-03 DIAGNOSIS — Z681 Body mass index (BMI) 19 or less, adult: Secondary | ICD-10-CM | POA: Diagnosis not present

## 2020-09-03 DIAGNOSIS — K219 Gastro-esophageal reflux disease without esophagitis: Secondary | ICD-10-CM | POA: Diagnosis not present

## 2020-12-10 ENCOUNTER — Other Ambulatory Visit: Payer: Self-pay

## 2020-12-10 ENCOUNTER — Encounter (HOSPITAL_COMMUNITY): Payer: Self-pay | Admitting: Emergency Medicine

## 2020-12-10 ENCOUNTER — Emergency Department (HOSPITAL_COMMUNITY): Payer: Medicaid Other

## 2020-12-10 ENCOUNTER — Emergency Department (HOSPITAL_COMMUNITY)
Admission: EM | Admit: 2020-12-10 | Discharge: 2020-12-10 | Disposition: A | Discharge status: Home / Self Care | Payer: Medicaid Other | Source: Home / Self Care | Attending: Emergency Medicine | Admitting: Emergency Medicine

## 2020-12-10 ENCOUNTER — Emergency Department (HOSPITAL_COMMUNITY)
Admission: EM | Admit: 2020-12-10 | Discharge: 2020-12-10 | Discharge status: Against Medical Advice | Payer: Medicaid Other | Attending: Emergency Medicine | Admitting: Emergency Medicine

## 2020-12-10 DIAGNOSIS — R1010 Upper abdominal pain, unspecified: Secondary | ICD-10-CM

## 2020-12-10 DIAGNOSIS — R079 Chest pain, unspecified: Secondary | ICD-10-CM | POA: Insufficient documentation

## 2020-12-10 DIAGNOSIS — Z5321 Procedure and treatment not carried out due to patient leaving prior to being seen by health care provider: Secondary | ICD-10-CM | POA: Diagnosis not present

## 2020-12-10 DIAGNOSIS — R1013 Epigastric pain: Secondary | ICD-10-CM | POA: Insufficient documentation

## 2020-12-10 DIAGNOSIS — N9489 Other specified conditions associated with female genital organs and menstrual cycle: Secondary | ICD-10-CM | POA: Insufficient documentation

## 2020-12-10 DIAGNOSIS — R109 Unspecified abdominal pain: Secondary | ICD-10-CM | POA: Diagnosis not present

## 2020-12-10 DIAGNOSIS — R112 Nausea with vomiting, unspecified: Secondary | ICD-10-CM | POA: Insufficient documentation

## 2020-12-10 LAB — I-STAT BETA HCG BLOOD, ED (MC, WL, AP ONLY): I-stat hCG, quantitative: <5 m[IU]/mL (ref <5)

## 2020-12-10 LAB — COMPREHENSIVE METABOLIC PANEL
ALT: 17 U/L (ref 0–44)
AST: 18 U/L (ref 15–41)
Albumin: 3.9 g/dL (ref 3.5–5.0)
Alkaline Phosphatase: 70 U/L (ref 38–126)
Anion gap: 7 (ref 5–15)
BUN: 11 mg/dL (ref 6–20)
CO2: 24 mmol/L (ref 22–32)
Calcium: 9.1 mg/dL (ref 8.9–10.3)
Chloride: 108 mmol/L (ref 98–111)
Creatinine, Ser: 1.07 mg/dL — ABNORMAL HIGH (ref 0.44–1.00)
GFR, Estimated: >60 mL/min (ref >60)
Glucose, Bld: 115 mg/dL — ABNORMAL HIGH (ref 70–99)
Potassium: 3.8 mmol/L (ref 3.5–5.1)
Sodium: 139 mmol/L (ref 135–145)
Total Bilirubin: 0.4 mg/dL (ref 0.3–1.2)
Total Protein: 6.7 g/dL (ref 6.5–8.1)

## 2020-12-10 LAB — URINALYSIS, ROUTINE W REFLEX MICROSCOPIC
Bilirubin Urine: NEGATIVE
Glucose, UA: NEGATIVE mg/dL
Ketones, ur: NEGATIVE mg/dL
Leukocytes,Ua: NEGATIVE
Nitrite: NEGATIVE
Protein, ur: NEGATIVE mg/dL
Specific Gravity, Urine: 1.025 (ref 1.005–1.030)
pH: 5 (ref 5.0–8.0)

## 2020-12-10 LAB — CBC WITH DIFFERENTIAL/PLATELET
Abs Immature Granulocytes: 0.02 10*3/uL (ref 0.00–0.07)
Basophils Absolute: 0.1 10*3/uL (ref 0.0–0.1)
Basophils Relative: 1 %
Eosinophils Absolute: 0.9 10*3/uL — ABNORMAL HIGH (ref 0.0–0.5)
Eosinophils Relative: 9 %
HCT: 41.2 % (ref 36.0–46.0)
Hemoglobin: 14 g/dL (ref 12.0–15.0)
Immature Granulocytes: 0 %
Lymphocytes Relative: 34 %
Lymphs Abs: 3.5 10*3/uL (ref 0.7–4.0)
MCH: 31.2 pg (ref 26.0–34.0)
MCHC: 34 g/dL (ref 30.0–36.0)
MCV: 91.8 fL (ref 80.0–100.0)
Monocytes Absolute: 0.9 10*3/uL (ref 0.1–1.0)
Monocytes Relative: 9 %
Neutro Abs: 4.8 10*3/uL (ref 1.7–7.7)
Neutrophils Relative %: 47 %
Platelets: 342 10*3/uL (ref 150–400)
RBC: 4.49 MIL/uL (ref 3.87–5.11)
RDW: 13.9 % (ref 11.5–15.5)
WBC: 10.1 10*3/uL (ref 4.0–10.5)
nRBC: 0 % (ref 0.0–0.2)

## 2020-12-10 LAB — TROPONIN I (HIGH SENSITIVITY)
Troponin I (High Sensitivity): <2 ng/L (ref <18)
Troponin I (High Sensitivity): <2 ng/L (ref <18)

## 2020-12-10 LAB — LIPASE, BLOOD: Lipase: 30 U/L (ref 11–51)

## 2020-12-10 MED ORDER — HYDROMORPHONE HCL 1 MG/ML IJ SOLN
1.0000 mg | Freq: Once | INTRAMUSCULAR | Status: AC
  Administered 2020-12-10: 1 mg via INTRAVENOUS
  Filled 2020-12-10: qty 1

## 2020-12-10 MED ORDER — PANTOPRAZOLE SODIUM 40 MG IV SOLR
40.0000 mg | Freq: Once | INTRAVENOUS | Status: AC
  Administered 2020-12-10: 40 mg via INTRAVENOUS
  Filled 2020-12-10: qty 40

## 2020-12-10 MED ORDER — IOHEXOL 350 MG/ML SOLN
75.0000 mL | Freq: Once | INTRAVENOUS | Status: AC | PRN
  Administered 2020-12-10: 75 mL via INTRAVENOUS

## 2020-12-10 MED ORDER — LORAZEPAM 2 MG/ML IJ SOLN
0.5000 mg | Freq: Once | INTRAMUSCULAR | Status: AC
  Administered 2020-12-10: 0.5 mg via INTRAVENOUS
  Filled 2020-12-10: qty 1

## 2020-12-10 MED ORDER — ONDANSETRON HCL 4 MG/2ML IJ SOLN
4.0000 mg | Freq: Once | INTRAMUSCULAR | Status: AC
  Administered 2020-12-10: 4 mg via INTRAVENOUS
  Filled 2020-12-10: qty 2

## 2020-12-10 MED ORDER — ONDANSETRON 4 MG PO TBDP
4.0000 mg | ORAL_TABLET | Freq: Once | ORAL | Status: AC
  Administered 2020-12-10: 4 mg via ORAL
  Filled 2020-12-10: qty 1

## 2020-12-10 NOTE — ED Triage Notes (Signed)
Pt c/o chest pain and epigastric pain that radiates to her back.
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
## 2020-12-10 NOTE — ED Triage Notes (Signed)
Pt c/o of chest pain/ epigastric pain that radiates through to her back.

## 2020-12-11 ENCOUNTER — Telehealth: Payer: Self-pay

## 2020-12-11 NOTE — Telephone Encounter (Signed)
Transition Care Management Follow-up Telephone Call Date of discharge and from where: 8/24/202[facility] How have you been since you were released from the hospital? Patient [facility] but stated she is doing fine.  Any questions or concerns? No  Items Reviewed: Did the pt receive and understand the discharge instructions provided?  Patient [facility]  Medications obtained and verified? No  Other? No  Any new allergies since your discharge? No  Dietary orders reviewed? N/A Do you have support at home? Yes   Home Care and Equipment/Supplies: Were home health services ordered? not applicable If so, what is the name of the agency? N/A  Has the agency set up a time to come to the patient's home? not applicable Were any new equipment or medical supplies ordered?  No What is the name of the medical supply agency? N/A Were you able to get the supplies/equipment? not applicable Do you have any questions related to the use of the equipment or supplies? No  Functional Questionnaire: (I = Independent and D = Dependent) ADLs: I  Bathing/Dressing- I  Meal Prep- I  Eating- I  Maintaining continence- I  Transferring/Ambulation- I  Managing Meds- I  Follow up appointments reviewed:  PCP Hospital f/u appt confirmed? No   Specialist Hospital f/u appt confirmed? No   Are transportation arrangements needed? No  If their condition worsens, is the pt aware to call PCP or go to the Emergency Dept.? Yes Was the patient provided with contact information for the PCP's office or ED? Yes Was to pt encouraged to call back with questions or concerns? Yes

## 2020-12-16 ENCOUNTER — Emergency Department (HOSPITAL_COMMUNITY): Payer: Medicaid Other

## 2020-12-16 ENCOUNTER — Emergency Department (HOSPITAL_COMMUNITY)
Admission: EM | Admit: 2020-12-16 | Discharge: 2020-12-17 | Disposition: A | Discharge status: Home / Self Care | Payer: Medicaid Other | Attending: Physician Assistant | Admitting: Physician Assistant

## 2020-12-16 DIAGNOSIS — F17210 Nicotine dependence, cigarettes, uncomplicated: Secondary | ICD-10-CM | POA: Diagnosis not present

## 2020-12-16 DIAGNOSIS — I10 Essential (primary) hypertension: Secondary | ICD-10-CM | POA: Insufficient documentation

## 2020-12-16 DIAGNOSIS — U071 COVID-19: Secondary | ICD-10-CM | POA: Diagnosis not present

## 2020-12-16 DIAGNOSIS — Z28310 Unvaccinated for covid-19: Secondary | ICD-10-CM | POA: Diagnosis not present

## 2020-12-16 DIAGNOSIS — R0602 Shortness of breath: Secondary | ICD-10-CM | POA: Diagnosis not present

## 2020-12-16 DIAGNOSIS — R509 Fever, unspecified: Secondary | ICD-10-CM | POA: Diagnosis not present

## 2020-12-16 LAB — CBC WITH DIFFERENTIAL/PLATELET
Abs Immature Granulocytes: 0.02 10*3/uL (ref 0.00–0.07)
Basophils Absolute: 0 10*3/uL (ref 0.0–0.1)
Basophils Relative: 1 %
Eosinophils Absolute: 0.1 10*3/uL (ref 0.0–0.5)
Eosinophils Relative: 3 %
HCT: 39.7 % (ref 36.0–46.0)
Hemoglobin: 13.3 g/dL (ref 12.0–15.0)
Immature Granulocytes: 0 %
Lymphocytes Relative: 6 %
Lymphs Abs: 0.3 10*3/uL — ABNORMAL LOW (ref 0.7–4.0)
MCH: 30.5 pg (ref 26.0–34.0)
MCHC: 33.5 g/dL (ref 30.0–36.0)
MCV: 91.1 fL (ref 80.0–100.0)
Monocytes Absolute: 0.5 10*3/uL (ref 0.1–1.0)
Monocytes Relative: 11 %
Neutro Abs: 3.9 10*3/uL (ref 1.7–7.7)
Neutrophils Relative %: 79 %
Platelets: 246 10*3/uL (ref 150–400)
RBC: 4.36 MIL/uL (ref 3.87–5.11)
RDW: 13.9 % (ref 11.5–15.5)
WBC: 4.9 10*3/uL (ref 4.0–10.5)
nRBC: 0 % (ref 0.0–0.2)

## 2020-12-16 LAB — URINALYSIS, ROUTINE W REFLEX MICROSCOPIC
Bilirubin Urine: NEGATIVE
Glucose, UA: NEGATIVE mg/dL
Hgb urine dipstick: NEGATIVE
Ketones, ur: 5 mg/dL — AB
Leukocytes,Ua: NEGATIVE
Nitrite: NEGATIVE
Protein, ur: 30 mg/dL — AB
Specific Gravity, Urine: 1.034 — ABNORMAL HIGH (ref 1.005–1.030)
pH: 5 (ref 5.0–8.0)

## 2020-12-16 LAB — PREGNANCY, URINE: Preg Test, Ur: NEGATIVE

## 2020-12-16 NOTE — ED Triage Notes (Signed)
Pt c/o fever, generalized body aches today. Thought she got too much sun until HA & fever- states highest fever 101.8, took tylenol. States when her HA gets bad, she experiences nausea. Unvaccinated for covid, has had once, states "it feels different"

## 2020-12-16 NOTE — ED Provider Notes (Addendum)
Emergency Medicine Provider Triage Evaluation Note  [name] , a 35 y.o. female  was evaluated in triage.  Pt complains of general body aches, shortness of breath, fever, generally feeling weak onset this morning.  Patient reports fever of 101.8 at home and took Tylenol with moderate relief.  No known sick contacts.  Is not vaccinated for COVID.  Did have COVID in 2020.  Review of Systems  Positive: Fever, chills, nausea, shortness of breath Negative: Syncope, urinary symptoms  Physical Exam  BP (!) 146/106 (BP Location: Left Arm)   Pulse (!) 112   Temp (!) 100.4 F (38 C) (Oral)   Resp (!) 28   LMP 12/09/2020 Comment: neg upreg  SpO2 100%  Gen:   Awake, no distress, anxious and crying Resp:  Normal effort, clear and equal breath sounds, mild tachypnea MSK:   Moves extremities without difficulty, no peripheral edema Other:  Abdomen soft and nontender  Medical Decision Making  Medically screening exam initiated at 11:07 PM.  Appropriate orders placed.  LILYANNA LUNT was informed that the remainder of the evaluation will be completed by another provider, this initial triage assessment does not replace that evaluation, and the importance of remaining in the ED until their evaluation is complete.  General labs, chest x-ray and COVID test.   Evren Shankland, Boyd Kerbs 12/16/20 2308    Ethelwyn Gilbertson, Dahlia Client, PA-C 12/17/20 0007    Kaylene Dawn, Dahlia Client, PA-C 12/17/20 0010    Dione Booze, MD 12/17/20 [phone]

## 2020-12-17 ENCOUNTER — Emergency Department (HOSPITAL_COMMUNITY): Payer: Medicaid Other

## 2020-12-17 DIAGNOSIS — R0602 Shortness of breath: Secondary | ICD-10-CM | POA: Diagnosis not present

## 2020-12-17 DIAGNOSIS — R509 Fever, unspecified: Secondary | ICD-10-CM | POA: Diagnosis not present

## 2020-12-17 LAB — COMPREHENSIVE METABOLIC PANEL
ALT: 22 U/L (ref 0–44)
AST: 31 U/L (ref 15–41)
Albumin: 3.9 g/dL (ref 3.5–5.0)
Alkaline Phosphatase: 69 U/L (ref 38–126)
Anion gap: 9 (ref 5–15)
BUN: 9 mg/dL (ref 6–20)
CO2: 22 mmol/L (ref 22–32)
Calcium: 9 mg/dL (ref 8.9–10.3)
Chloride: 105 mmol/L (ref 98–111)
Creatinine, Ser: 0.84 mg/dL (ref 0.44–1.00)
GFR, Estimated: >60 mL/min (ref >60)
Glucose, Bld: 106 mg/dL — ABNORMAL HIGH (ref 70–99)
Potassium: 3.3 mmol/L — ABNORMAL LOW (ref 3.5–5.1)
Sodium: 136 mmol/L (ref 135–145)
Total Bilirubin: 0.7 mg/dL (ref 0.3–1.2)
Total Protein: 6.9 g/dL (ref 6.5–8.1)

## 2020-12-17 LAB — RESP PANEL BY RT-PCR (FLU A&B, COVID) ARPGX2
Influenza A by PCR: NEGATIVE
Influenza B by PCR: NEGATIVE
SARS Coronavirus 2 by RT PCR: POSITIVE — AB

## 2020-12-17 LAB — LIPASE, BLOOD: Lipase: 26 U/L (ref 11–51)

## 2020-12-17 MED ORDER — BENZONATATE 100 MG PO CAPS: 100.0000 mg | ORAL_CAPSULE | Freq: Three times a day (TID) | ORAL | 0 refills | Status: DC | PRN

## 2020-12-17 MED ORDER — ALBUTEROL SULFATE HFA 108 (90 BASE) MCG/ACT IN AERS: 2.0000 | INHALATION_SPRAY | RESPIRATORY_TRACT | 0 refills | Status: DC | PRN

## 2020-12-17 MED ORDER — NIRMATRELVIR/RITONAVIR (PAXLOVID)TABLET: ORAL_TABLET | ORAL | 0 refills | Status: DC

## 2020-12-17 MED ORDER — ACETAMINOPHEN 500 MG PO TABS
1000.0000 mg | ORAL_TABLET | Freq: Once | ORAL | Status: AC
  Administered 2020-12-17: 1000 mg via ORAL
  Filled 2020-12-17: qty 2

## 2020-12-17 MED ORDER — AEROCHAMBER PLUS FLO-VU MEDIUM MISC: 1.0000 | Freq: Once | 0 refills | Status: AC

## 2020-12-17 MED ORDER — ONDANSETRON HCL 4 MG PO TABS: 4.0000 mg | ORAL_TABLET | Freq: Three times a day (TID) | ORAL | 0 refills | Status: DC | PRN

## 2020-12-17 NOTE — ED Notes (Addendum)
Patient outside on bench. This Clinical research associate tried to get patient inside in the appropriate seating area. Patient states she is passing out but refuses to come inside unless her boyfriend can sit with her. She states if she passes out again she will have him briing her in. Triage rn notified

## 2020-12-17 NOTE — ED Notes (Signed)
Patient sitting in appropriate seating area 
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
## 2020-12-17 NOTE — Discharge Instructions (Addendum)
1. Medications: Alternate tylenol and ibuprofen for fever control, Paxlovid, zofran for nausea, tessalon for cough, albuterol for chest tightness; continue usual home medications 2. Treatment: rest, drink plenty of fluids, isolate for the next 10 days 3. Follow Up: Please followup with your primary doctor if your symptoms are not improving after 10-14 days; Please return to the ER for high fevers, persistent vomiting, shortness of breath or other concerns.

## 2020-12-18 ENCOUNTER — Telehealth: Payer: Self-pay | Admitting: *Deleted

## 2020-12-18 NOTE — Telephone Encounter (Signed)
Transition Care Management Unsuccessful Follow-up Telephone Call  Date of discharge and from where:  12/17/2020 [facility]  Attempts:  1st Attempt  Reason for unsuccessful TCM follow-up call:  Unable to reach patient

## 2020-12-19 NOTE — Telephone Encounter (Signed)
Transition Care Management Unsuccessful Follow-up Telephone Call  Date of discharge and from where:  12/17/2020 [facility]  Attempts:  2nd Attempt  Reason for unsuccessful TCM follow-up call:  Unable to reach patient

## 2020-12-23 NOTE — Telephone Encounter (Signed)
Transition Care Management Unsuccessful Follow-up Telephone Call  Date of discharge and from where:  12/17/2020-   Attempts:  3rd Attempt  Reason for unsuccessful TCM follow-up call:  Unable to reach patient

## 2021-03-30 ENCOUNTER — Emergency Department (HOSPITAL_COMMUNITY): Payer: Medicaid Other

## 2021-03-30 ENCOUNTER — Encounter (HOSPITAL_COMMUNITY): Payer: Self-pay | Admitting: Emergency Medicine

## 2021-03-30 ENCOUNTER — Emergency Department (HOSPITAL_COMMUNITY)
Admission: EM | Admit: 2021-03-30 | Discharge: 2021-03-30 | Disposition: A | Discharge status: Home / Self Care | Payer: Medicaid Other | Attending: Emergency Medicine | Admitting: Emergency Medicine

## 2021-03-30 ENCOUNTER — Other Ambulatory Visit: Payer: Self-pay

## 2021-03-30 DIAGNOSIS — S3992XA Unspecified injury of lower back, initial encounter: Secondary | ICD-10-CM | POA: Diagnosis not present

## 2021-03-30 DIAGNOSIS — I10 Essential (primary) hypertension: Secondary | ICD-10-CM | POA: Diagnosis not present

## 2021-03-30 DIAGNOSIS — T07XXXA Unspecified multiple injuries, initial encounter: Secondary | ICD-10-CM

## 2021-03-30 DIAGNOSIS — W01198A Fall on same level from slipping, tripping and stumbling with subsequent striking against other object, initial encounter: Secondary | ICD-10-CM | POA: Insufficient documentation

## 2021-03-30 DIAGNOSIS — S299XXA Unspecified injury of thorax, initial encounter: Secondary | ICD-10-CM | POA: Diagnosis present

## 2021-03-30 DIAGNOSIS — F17210 Nicotine dependence, cigarettes, uncomplicated: Secondary | ICD-10-CM | POA: Diagnosis not present

## 2021-03-30 MED ORDER — OXYCODONE-ACETAMINOPHEN 5-325 MG PO TABS
1.0000 | ORAL_TABLET | Freq: Once | ORAL | Status: AC
  Administered 2021-03-30: 1 via ORAL
  Filled 2021-03-30: qty 1

## 2021-03-30 MED ORDER — OXYCODONE-ACETAMINOPHEN 5-325 MG PO TABS: 1.0000 | ORAL_TABLET | Freq: Four times a day (QID) | ORAL | 0 refills | Status: DC | PRN

## 2021-03-30 MED ORDER — OXYCODONE-ACETAMINOPHEN 5-325 MG PO TABS
1.0000 | ORAL_TABLET | Freq: Once | ORAL | Status: AC
Start: 2021-03-30 — End: 2021-03-30
  Administered 2021-03-30: 1 via ORAL
  Filled 2021-03-30: qty 1

## 2021-03-30 MED ORDER — DIAZEPAM 10 MG PO TABS: 10.0000 mg | ORAL_TABLET | Freq: Four times a day (QID) | ORAL | 0 refills | Status: DC | PRN

## 2021-03-30 NOTE — Discharge Instructions (Signed)
There were no serious problems found on the imaging.  Use ice on sore spots for 2 days after that use heat.  See your doctor as needed for problems.  Do not drive or drink alcohol when taking the sedating narcotic and muscle relaxer medications.

## 2021-03-30 NOTE — ED Notes (Signed)
Requested heating pad.   Hot pack given.
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
## 2021-04-20 ENCOUNTER — Encounter (HOSPITAL_COMMUNITY): Payer: Self-pay

## 2021-04-20 ENCOUNTER — Other Ambulatory Visit: Payer: Self-pay

## 2021-04-20 ENCOUNTER — Emergency Department (HOSPITAL_COMMUNITY)
Admission: EM | Admit: 2021-04-20 | Discharge: 2021-04-21 | Disposition: A | Discharge status: Home / Self Care | Payer: Medicaid Other | Attending: Emergency Medicine | Admitting: Emergency Medicine

## 2021-04-20 DIAGNOSIS — R109 Unspecified abdominal pain: Secondary | ICD-10-CM

## 2021-04-20 DIAGNOSIS — R1110 Vomiting, unspecified: Secondary | ICD-10-CM | POA: Insufficient documentation

## 2021-04-20 DIAGNOSIS — R1013 Epigastric pain: Secondary | ICD-10-CM | POA: Insufficient documentation

## 2021-04-20 LAB — COMPREHENSIVE METABOLIC PANEL
ALT: 24 U/L (ref 0–44)
AST: 17 U/L (ref 15–41)
Albumin: 4.1 g/dL (ref 3.5–5.0)
Alkaline Phosphatase: 84 U/L (ref 38–126)
Anion gap: 8 (ref 5–15)
BUN: 12 mg/dL (ref 6–20)
CO2: 23 mmol/L (ref 22–32)
Calcium: 8.9 mg/dL (ref 8.9–10.3)
Chloride: 106 mmol/L (ref 98–111)
Creatinine, Ser: 0.64 mg/dL (ref 0.44–1.00)
GFR, Estimated: >60 mL/min (ref >60)
Glucose, Bld: 112 mg/dL — ABNORMAL HIGH (ref 70–99)
Potassium: 3.8 mmol/L (ref 3.5–5.1)
Sodium: 137 mmol/L (ref 135–145)
Total Bilirubin: 0.3 mg/dL (ref 0.3–1.2)
Total Protein: 7 g/dL (ref 6.5–8.1)

## 2021-04-20 LAB — CBC WITH DIFFERENTIAL/PLATELET
Abs Immature Granulocytes: 0.03 10*3/uL (ref 0.00–0.07)
Basophils Absolute: 0.1 10*3/uL (ref 0.0–0.1)
Basophils Relative: 1 %
Eosinophils Absolute: 0.8 10*3/uL — ABNORMAL HIGH (ref 0.0–0.5)
Eosinophils Relative: 8 %
HCT: 41.1 % (ref 36.0–46.0)
Hemoglobin: 13.5 g/dL (ref 12.0–15.0)
Immature Granulocytes: 0 %
Lymphocytes Relative: 35 %
Lymphs Abs: 3.5 10*3/uL (ref 0.7–4.0)
MCH: 30.8 pg (ref 26.0–34.0)
MCHC: 32.8 g/dL (ref 30.0–36.0)
MCV: 93.6 fL (ref 80.0–100.0)
Monocytes Absolute: 0.9 10*3/uL (ref 0.1–1.0)
Monocytes Relative: 9 %
Neutro Abs: 4.7 10*3/uL (ref 1.7–7.7)
Neutrophils Relative %: 47 %
Platelets: 318 10*3/uL (ref 150–400)
RBC: 4.39 MIL/uL (ref 3.87–5.11)
RDW: 13.5 % (ref 11.5–15.5)
WBC: 10 10*3/uL (ref 4.0–10.5)
nRBC: 0 % (ref 0.0–0.2)

## 2021-04-20 LAB — LIPASE, BLOOD: Lipase: 27 U/L (ref 11–51)

## 2021-04-20 MED ORDER — DIPHENHYDRAMINE HCL 50 MG/ML IJ SOLN
25.0000 mg | Freq: Once | INTRAMUSCULAR | Status: AC
  Administered 2021-04-20: 25 mg via INTRAVENOUS
  Filled 2021-04-20: qty 1

## 2021-04-20 MED ORDER — DROPERIDOL 2.5 MG/ML IJ SOLN
1.2500 mg | Freq: Once | INTRAMUSCULAR | Status: AC
  Administered 2021-04-20: 1.25 mg via INTRAVENOUS
  Filled 2021-04-20: qty 2

## 2021-04-20 MED ORDER — LACTATED RINGERS IV BOLUS
1000.0000 mL | Freq: Once | INTRAVENOUS | Status: AC
  Administered 2021-04-20: 1000 mL via INTRAVENOUS

## 2021-04-20 NOTE — ED Provider Notes (Signed)
[facility] EMERGENCY DEPARTMENT Provider Note  CSN: [id number] Arrival date & time: 04/20/21 1807  History Chief Complaint  Patient presents with   Abdominal Pain    [name] is a 36 y.o. female with history of chronic abdominal pain/pancreatitis reports increasing pain for the last 2-3 days associated with vomiting, no fever. Similar to previous, radiates into her back. No recent alcohol use. Smokes marijuana occasionally, but not as much as before.    Home Medications Prior to Admission medications   Medication Sig Start Date End Date Taking? Authorizing Provider  albuterol (VENTOLIN HFA) 108 (90 Base) MCG/ACT inhaler Inhale 2 puffs into the lungs every 2 (two) hours as needed for wheezing or shortness of breath (cough). Patient not taking: Reported on 03/30/2021 12/17/20   Muthersbaugh, Dahlia Client, PA-C  benzonatate (TESSALON PERLES) 100 MG capsule Take 1 capsule (100 mg total) by mouth 3 (three) times daily as needed for cough (cough). Patient not taking: Reported on 03/30/2021 12/17/20   Muthersbaugh, Dahlia Client, PA-C  diazepam (VALIUM) 10 MG tablet Take 1 tablet (10 mg total) by mouth every 6 (six) hours as needed (Muscle spasm). 03/30/21   Mancel Bale, MD  diclofenac Sodium (VOLTAREN) 1 % GEL Apply 2 g topically 4 (four) times daily as needed (knee pain).    [provider]  nirmatrelvir/ritonavir EUA (PAXLOVID) 20 x 150 MG & 10 x 100MG  TABS Take nirmatrelvir (150 mg) two tablets twice daily for 5 days and ritonavir (100 mg) one tablet twice daily for 5 days. Patient not taking: Reported on 03/30/2021 12/17/20   Muthersbaugh, 12/19/20, PA-C  ondansetron (ZOFRAN) 4 MG tablet Take 1 tablet (4 mg total) by mouth every 8 (eight) hours as needed for nausea or vomiting. Patient not taking: Reported on 03/30/2021 12/17/20   Muthersbaugh, 12/19/20, PA-C  oxyCODONE-acetaminophen (PERCOCET/ROXICET) 5-325 MG tablet Take 1 tablet by mouth every 6 (six) hours as needed for severe pain.  03/30/21   14/12/22, MD  pantoprazole (PROTONIX) 40 MG tablet Take 40 mg by mouth 2 (two) times daily as needed (acid reflux).    [provider]  promethazine (PHENERGAN) 25 MG tablet Take 25 mg by mouth every 4 (four) hours as needed for nausea or vomiting. 11/13/19   [provider]  tiZANidine (ZANAFLEX) 4 MG tablet Take 4 mg by mouth at bedtime. Patient not taking: Reported on 03/30/2021 11/18/20   [provider]  traZODone (DESYREL) 50 MG tablet Take 50 mg by mouth at bedtime as needed for sleep. 03/23/21   [provider]     Allergies    Bee venom, Other, Fentanyl, Morphine, Toradol [ketorolac tromethamine], and Reglan [metoclopramide]   Review of Systems   Review of Systems Please see HPI for pertinent positives and negatives  Physical Exam BP (!) 125/92 (BP Location: Right Arm)    Pulse 100    Temp 98.2 F (36.8 C) (Oral)    Resp 15    Ht 5\' 4"  (1.626 m)    Wt 52.2 kg    LMP 03/30/2021 (Approximate)    SpO2 97%    BMI 19.74 kg/m   Physical Exam Vitals and nursing note reviewed.  Constitutional:      Appearance: Normal appearance.  HENT:     Head: Normocephalic and atraumatic.     Nose: Nose normal.     Mouth/Throat:     Mouth: Mucous membranes are moist.  Eyes:     Extraocular Movements: Extraocular movements intact.     Conjunctiva/sclera:  Conjunctivae normal.  Cardiovascular:     Rate and Rhythm: Normal rate.  Pulmonary:     Effort: Pulmonary effort is normal.     Breath sounds: Normal breath sounds.  Abdominal:     General: Abdomen is flat.     Palpations: Abdomen is soft.     Tenderness: There is abdominal tenderness in the epigastric area. There is no guarding. Negative signs include Murphy's sign and McBurney's sign.  Musculoskeletal:        General: No swelling. Normal range of motion.     Cervical back: Neck supple.  Skin:    General: Skin is warm and dry.  Neurological:     General: No focal deficit present.      Mental Status: She is alert.  Psychiatric:        Mood and Affect: Mood normal.    ED Results / Procedures / Treatments   EKG None  Procedures Procedures  Medications Ordered in the ED Medications  lactated ringers bolus 1,000 mL (has no administration in time range)    Initial Impression and Plan  Patient with exacerbation of chronic abdominal pain. No longer comes to the ED frequently for same. Denies THC use. Will check labs, give IVF. Check EKG, if normal QT, plan droperidol which she states she does not think she's had before.   ED Course   Clinical Course as of 04/21/21 2216  Mon Apr 20, 2021  2202 CBC is normal. EKG has normal QT, will give a dose of droperidol and reassess.  [CS]  2216 CMP and lipase are normal.  [CS]  2232 Per RN, patient having some restlessness after droperidol, will give Benadryl as well.  [CS]  2310 Care of the patient signed out to Dr. Pilar Plate at the change of shift pending UA and symptom control.  [CS]    Clinical Course User Index [CS] Pollyann Savoy, MD     MDM Rules/Calculators/A&P Medical Decision Making   Final Clinical Impression(s) / ED Diagnoses Final diagnoses:  None    Rx / DC Orders ED Discharge Orders     None        Pollyann Savoy, MD 04/21/21 2216

## 2021-04-20 NOTE — ED Provider Notes (Signed)
°  Provider Note MRN:  [id number]  Arrival date & time: 04/21/21    ED Course and Medical Decision Making  Assumed care from Dr. Karle Starch at shift change.  Acute on chronic abdominal pain, was given droperidol, plan is to reassess.  Currently without indication for imaging.  On reassessment patient is feeling better and requesting discharge.  Return precautions provided.  Procedures  Final Clinical Impressions(s) / ED Diagnoses     ICD-10-CM   1. Abdominal pain, unspecified abdominal location  R10.9       ED Discharge Orders     None         Discharge Instructions      You were evaluated in the Emergency Department and after careful evaluation, we did not find any emergent condition requiring admission or further testing in the hospital.  Your exam/testing today is overall reassuring.  Recommend follow-up with your regular doctors to discuss your symptoms.  Please return to the Emergency Department if you experience any worsening of your condition.   Thank you for allowing [place] to be a part of your care.      Barth Kirks. Sedonia Small, [place] [email or web address]    Maudie Flakes, MD 04/21/21 [phone]

## 2021-04-20 NOTE — ED Triage Notes (Signed)
Abdominal pain x 1 day accompanied by n/v/d. Denies fever.

## 2021-04-21 LAB — URINALYSIS, ROUTINE W REFLEX MICROSCOPIC
Bilirubin Urine: NEGATIVE
Glucose, UA: NEGATIVE mg/dL
Hgb urine dipstick: NEGATIVE
Ketones, ur: NEGATIVE mg/dL
Leukocytes,Ua: NEGATIVE
Nitrite: NEGATIVE
Protein, ur: NEGATIVE mg/dL
Specific Gravity, Urine: 1.012 (ref 1.005–1.030)
pH: 7 (ref 5.0–8.0)

## 2021-04-21 LAB — PREGNANCY, URINE: Preg Test, Ur: NEGATIVE

## 2021-04-21 NOTE — Discharge Instructions (Signed)
You were evaluated in the Emergency Department and after careful evaluation, we did not find any emergent condition requiring admission or further testing in the hospital.  Your exam/testing today is overall reassuring.  Recommend follow-up with your regular doctors to discuss your symptoms.  Please return to the Emergency Department if you experience any worsening of your condition.   Thank you for allowing [place] to be a part of your care.

## 2021-04-22 ENCOUNTER — Telehealth: Payer: Self-pay

## 2021-04-22 NOTE — Telephone Encounter (Signed)
Transition Care Management Unsuccessful Follow-up Telephone Call  Date of discharge and from where:  04/21/2021-[facility]   Attempts:  1st Attempt  Reason for unsuccessful TCM follow-up call:  Unable to leave message

## 2021-04-23 NOTE — Telephone Encounter (Signed)
Transition Care Management Follow-up Telephone Call Date of discharge and from where: 04/21/2021 from [facility] How have you been since you were released from the hospital? Pt stated that she is feeling much better and did not have any questions or concerns at this time.  Any questions or concerns? No  Items Reviewed: Did the pt receive and understand the discharge instructions provided? Yes  Medications obtained and verified? Yes  Other? No  Any new allergies since your discharge? No  Dietary orders reviewed? No Do you have support at home? Yes   Functional Questionnaire: (I = Independent and D = Dependent) ADLs: I  Bathing/Dressing- I  Meal Prep- I  Eating- I  Maintaining continence- I  Transferring/Ambulation- I  Managing Meds- I .  Follow up appointments reviewed:  PCP Hospital f/u appt confirmed? No   Specialist Hospital f/u appt confirmed? No   Are transportation arrangements needed? No  If their condition worsens, is the pt aware to call PCP or go to the Emergency Dept.? Yes Was the patient provided with contact information for the PCP's office or ED? Yes Was to pt encouraged to call back with questions or concerns? Yes

## 2021-06-17 ENCOUNTER — Emergency Department (HOSPITAL_COMMUNITY)
Admission: EM | Admit: 2021-06-17 | Discharge: 2021-06-17 | Disposition: A | Discharge status: Home / Self Care | Payer: Medicaid Other | Attending: Emergency Medicine | Admitting: Emergency Medicine

## 2021-06-17 ENCOUNTER — Other Ambulatory Visit: Payer: Self-pay

## 2021-06-17 DIAGNOSIS — R945 Abnormal results of liver function studies: Secondary | ICD-10-CM | POA: Insufficient documentation

## 2021-06-17 DIAGNOSIS — R7989 Other specified abnormal findings of blood chemistry: Secondary | ICD-10-CM

## 2021-06-17 DIAGNOSIS — N9489 Other specified conditions associated with female genital organs and menstrual cycle: Secondary | ICD-10-CM | POA: Diagnosis not present

## 2021-06-17 DIAGNOSIS — R000 Tachycardia, unspecified: Secondary | ICD-10-CM | POA: Insufficient documentation

## 2021-06-17 DIAGNOSIS — R1013 Epigastric pain: Secondary | ICD-10-CM | POA: Diagnosis not present

## 2021-06-17 LAB — COMPREHENSIVE METABOLIC PANEL
ALT: 95 U/L — ABNORMAL HIGH (ref 0–44)
AST: 49 U/L — ABNORMAL HIGH (ref 15–41)
Albumin: 4.4 g/dL (ref 3.5–5.0)
Alkaline Phosphatase: 112 U/L (ref 38–126)
Anion gap: 10 (ref 5–15)
BUN: 12 mg/dL (ref 6–20)
CO2: 24 mmol/L (ref 22–32)
Calcium: 9.1 mg/dL (ref 8.9–10.3)
Chloride: 102 mmol/L (ref 98–111)
Creatinine, Ser: 0.77 mg/dL (ref 0.44–1.00)
GFR, Estimated: >60 mL/min (ref >60)
Glucose, Bld: 78 mg/dL (ref 70–99)
Potassium: 3.8 mmol/L (ref 3.5–5.1)
Sodium: 136 mmol/L (ref 135–145)
Total Bilirubin: 0.1 mg/dL — ABNORMAL LOW (ref 0.3–1.2)
Total Protein: 8.2 g/dL — ABNORMAL HIGH (ref 6.5–8.1)

## 2021-06-17 LAB — I-STAT CHEM 8, ED
BUN: 11 mg/dL (ref 6–20)
Calcium, Ion: 1.12 mmol/L — ABNORMAL LOW (ref 1.15–1.40)
Chloride: 103 mmol/L (ref 98–111)
Creatinine, Ser: 0.7 mg/dL (ref 0.44–1.00)
Glucose, Bld: 81 mg/dL (ref 70–99)
HCT: 45 % (ref 36.0–46.0)
Hemoglobin: 15.3 g/dL — ABNORMAL HIGH (ref 12.0–15.0)
Potassium: 3.9 mmol/L (ref 3.5–5.1)
Sodium: 138 mmol/L (ref 135–145)
TCO2: 26 mmol/L (ref 22–32)

## 2021-06-17 LAB — CBC WITH DIFFERENTIAL/PLATELET
Abs Immature Granulocytes: 0.04 10*3/uL (ref 0.00–0.07)
Basophils Absolute: 0.1 10*3/uL (ref 0.0–0.1)
Basophils Relative: 1 %
Eosinophils Absolute: 0.5 10*3/uL (ref 0.0–0.5)
Eosinophils Relative: 5 %
HCT: 44.5 % (ref 36.0–46.0)
Hemoglobin: 14 g/dL (ref 12.0–15.0)
Immature Granulocytes: 0 %
Lymphocytes Relative: 26 %
Lymphs Abs: 2.6 10*3/uL (ref 0.7–4.0)
MCH: 30.4 pg (ref 26.0–34.0)
MCHC: 31.5 g/dL (ref 30.0–36.0)
MCV: 96.5 fL (ref 80.0–100.0)
Monocytes Absolute: 0.8 10*3/uL (ref 0.1–1.0)
Monocytes Relative: 8 %
Neutro Abs: 6 10*3/uL (ref 1.7–7.7)
Neutrophils Relative %: 60 %
Platelets: 405 10*3/uL — ABNORMAL HIGH (ref 150–400)
RBC: 4.61 MIL/uL (ref 3.87–5.11)
RDW: 14.4 % (ref 11.5–15.5)
WBC: 10 10*3/uL (ref 4.0–10.5)
nRBC: 0 % (ref 0.0–0.2)

## 2021-06-17 LAB — I-STAT BETA HCG BLOOD, ED (MC, WL, AP ONLY): I-stat hCG, quantitative: <5 m[IU]/mL (ref <5)

## 2021-06-17 LAB — LIPASE, BLOOD: Lipase: 36 U/L (ref 11–51)

## 2021-06-17 MED ORDER — DIPHENHYDRAMINE HCL 50 MG/ML IJ SOLN
12.5000 mg | Freq: Once | INTRAMUSCULAR | Status: AC
  Administered 2021-06-17: 12.5 mg via INTRAVENOUS
  Filled 2021-06-17: qty 1

## 2021-06-17 MED ORDER — PROCHLORPERAZINE EDISYLATE 10 MG/2ML IJ SOLN
10.0000 mg | Freq: Once | INTRAMUSCULAR | Status: AC
  Administered 2021-06-17: 10 mg via INTRAVENOUS
  Filled 2021-06-17: qty 2

## 2021-06-17 MED ORDER — SODIUM CHLORIDE 0.9 % IV BOLUS
1000.0000 mL | Freq: Once | INTRAVENOUS | Status: AC
  Administered 2021-06-17: 1000 mL via INTRAVENOUS

## 2021-06-17 MED ORDER — OXYCODONE-ACETAMINOPHEN 5-325 MG PO TABS: 1.0000 | ORAL_TABLET | Freq: Four times a day (QID) | ORAL | 0 refills | Status: DC | PRN

## 2021-06-17 MED ORDER — HYDROMORPHONE HCL 1 MG/ML IJ SOLN
1.0000 mg | Freq: Once | INTRAMUSCULAR | Status: AC
  Administered 2021-06-17: 1 mg via INTRAVENOUS
  Filled 2021-06-17: qty 1

## 2021-06-17 NOTE — ED Provider Notes (Signed)
[facility] EMERGENCY DEPARTMENT Provider Note   CSN: [id number] Arrival date & time: 06/17/21  1551     History  Chief Complaint  Patient presents with   Abdominal Pain    [name] is a 36 y.o. female.  She has a history of chronic abdominal pain chronic pancreatitis and has a care plan in place.  She said she started with upper abdominal pain yesterday that worsened today associated with 1 episode of vomiting.  Radiates through to her back.  She tried her home medications without improvement.  She used to be on narcotics but has not taken any in 3 years.  She denies any recent alcohol use.  No fevers or chills.  The history is provided by the patient.  Abdominal Pain Pain location:  Epigastric Pain quality: stabbing   Pain radiates to:  Back Pain severity:  Severe Onset quality:  Gradual Duration:  2 days Timing:  Constant Progression:  Worsening Chronicity:  Recurrent Context: not trauma   Relieved by:  Nothing Worsened by:  Nothing Ineffective treatments:  OTC medications Associated symptoms: diarrhea, nausea and vomiting   Associated symptoms: no chest pain, no constipation, no cough, no dysuria, no fever, no hematemesis, no hematochezia, no hematuria, no shortness of breath and no sore throat       Home Medications Prior to Admission medications   Medication Sig Start Date End Date Taking? Authorizing Provider  albuterol (VENTOLIN HFA) 108 (90 Base) MCG/ACT inhaler Inhale 2 puffs into the lungs every 2 (two) hours as needed for wheezing or shortness of breath (cough). Patient not taking: Reported on 03/30/2021 12/17/20   Muthersbaugh, Jarrett Soho, PA-C  benzonatate (TESSALON PERLES) 100 MG capsule Take 1 capsule (100 mg total) by mouth 3 (three) times daily as needed for cough (cough). Patient not taking: Reported on 03/30/2021 12/17/20   Muthersbaugh, Jarrett Soho, PA-C  diazepam (VALIUM) 10 MG tablet Take 1 tablet (10 mg total) by mouth every 6 (six) hours as needed  (Muscle spasm). 03/30/21   Daleen Bo, MD  diclofenac Sodium (VOLTAREN) 1 % GEL Apply 2 g topically 4 (four) times daily as needed (knee pain).    [provider]  nirmatrelvir/ritonavir EUA (PAXLOVID) 20 x 150 MG & 10 x 100MG  TABS Take nirmatrelvir (150 mg) two tablets twice daily for 5 days and ritonavir (100 mg) one tablet twice daily for 5 days. Patient not taking: Reported on 03/30/2021 12/17/20   Muthersbaugh, Jarrett Soho, PA-C  ondansetron (ZOFRAN) 4 MG tablet Take 1 tablet (4 mg total) by mouth every 8 (eight) hours as needed for nausea or vomiting. Patient not taking: Reported on 03/30/2021 12/17/20   Muthersbaugh, Jarrett Soho, PA-C  oxyCODONE-acetaminophen (PERCOCET/ROXICET) 5-325 MG tablet Take 1 tablet by mouth every 6 (six) hours as needed for severe pain. 03/30/21   Daleen Bo, MD  pantoprazole (PROTONIX) 40 MG tablet Take 40 mg by mouth 2 (two) times daily as needed (acid reflux).    [provider]  promethazine (PHENERGAN) 25 MG tablet Take 25 mg by mouth every 4 (four) hours as needed for nausea or vomiting. 11/13/19   [provider]  tiZANidine (ZANAFLEX) 4 MG tablet Take 4 mg by mouth at bedtime. Patient not taking: Reported on 03/30/2021 11/18/20   [provider]  traZODone (DESYREL) 50 MG tablet Take 50 mg by mouth at bedtime as needed for sleep. 03/23/21   [provider]      Allergies    Bee venom, Other, Fentanyl, Morphine, Toradol [ketorolac tromethamine],  and Reglan [metoclopramide]    Review of Systems   Review of Systems  Constitutional:  Negative for fever.  HENT:  Negative for sore throat.   Respiratory:  Negative for cough and shortness of breath.   Cardiovascular:  Negative for chest pain.  Gastrointestinal:  Positive for abdominal pain, diarrhea, nausea and vomiting. Negative for constipation, hematemesis and hematochezia.  Genitourinary:  Negative for dysuria and hematuria.  Musculoskeletal:  Positive for back pain.   Skin:  Negative for rash.  Neurological:  Negative for headaches.   Physical Exam Updated Vital Signs BP (!) 125/110    Pulse (!) 102    Temp 98.2 F (36.8 C) (Oral)    Resp 20    Ht 5\' 4"  (1.626 m)    Wt 53.5 kg    SpO2 100%    BMI 20.25 kg/m  Physical Exam Vitals and nursing note reviewed.  Constitutional:      General: She is not in acute distress.    Appearance: Normal appearance. She is well-developed.  HENT:     Head: Normocephalic and atraumatic.  Eyes:     Conjunctiva/sclera: Conjunctivae normal.  Cardiovascular:     Rate and Rhythm: Regular rhythm. Tachycardia present.     Heart sounds: No murmur heard. Pulmonary:     Effort: Pulmonary effort is normal. No respiratory distress.     Breath sounds: Normal breath sounds.  Abdominal:     Palpations: Abdomen is soft.     Tenderness: There is abdominal tenderness in the epigastric area. There is no guarding or rebound.  Musculoskeletal:        General: No swelling. Normal range of motion.     Cervical back: Neck supple.  Skin:    General: Skin is warm and dry.     Capillary Refill: Capillary refill takes less than 2 seconds.  Neurological:     General: No focal deficit present.     Mental Status: She is alert.    ED Results / Procedures / Treatments   Labs (all labs ordered are listed, but only abnormal results are displayed) Labs Reviewed  COMPREHENSIVE METABOLIC PANEL - Abnormal; Notable for the following components:      Result Value   Total Protein 8.2 (*)    AST 49 (*)    ALT 95 (*)    Total Bilirubin 0.1 (*)    All other components within normal limits  CBC WITH DIFFERENTIAL/PLATELET - Abnormal; Notable for the following components:   Platelets 405 (*)    All other components within normal limits  I-STAT CHEM 8, ED - Abnormal; Notable for the following components:   Calcium, Ion 1.12 (*)    Hemoglobin 15.3 (*)    All other components within normal limits  LIPASE, BLOOD  I-STAT BETA HCG BLOOD, ED ([facility],  [facility], [facility] ONLY)    EKG None  Radiology No results found.  Procedures Procedures    Medications Ordered in ED Medications  HYDROmorphone (DILAUDID) injection 1 mg (1 mg Intravenous Given 06/17/21 1726)  prochlorperazine (COMPAZINE) injection 10 mg (10 mg Intravenous Given 06/17/21 1726)  diphenhydrAMINE (BENADRYL) injection 12.5 mg (12.5 mg Intravenous Given 06/17/21 1726)  sodium chloride 0.9 % bolus 1,000 mL (0 mLs Intravenous Stopped 06/17/21 1846)    ED Course/ Medical Decision Making/ A&P Clinical Course as of 06/18/21 0856  Wed Jun 17, 2021  1850 Patient states her pain is improved.  She does have mild bump of her AST and ALT.  Normal lipase.  We will give her contact information for GI. [MB]    Clinical Course User Index [MB] Hayden Rasmussen, MD                           Medical Decision Making Amount and/or Complexity of Data Reviewed Labs: ordered.  Risk Prescription drug management.  This patient complains of upper abdominal pain through to the back consistent with her prior pancreatitis; this involves an extensive number of treatment Options and is a complaint that carries with it a high risk of complications and morbidity. The differential includes pancreatitis, gastritis, chronic abdominal pain, perforation  I ordered, reviewed and interpreted labs, which included CBC with normal white count normal hemoglobin, chemistries normal, LFTs mildly elevated, lipase normal, pregnancy test negative I ordered medication IV fluids nausea and pain medication with improvement in her symptoms and reviewed PMP when indicated. Additional history obtained from patient significant other Previous records obtained and reviewed in epic, patient has less frequent visits than in past.  Also has not seen GI in a long time   Social determinants considered, ongoing tobacco use Critical Interventions: None  After the interventions stated above, I reevaluated the patient and found patient to  be symptomatically improved Admission and further testing considered, no indications for admission at this time.  Also will forego imaging as presentation similar to prior.  Given contact information for outpatient GI follow-up due to her chronic condition plus newly elevated LFTs.          Final Clinical Impression(s) / ED Diagnoses Final diagnoses:  Epigastric pain  LFT elevation    Rx / DC Orders ED Discharge Orders          Ordered    oxyCODONE-acetaminophen (PERCOCET/ROXICET) 5-325 MG tablet  Every 6 hours PRN        06/17/21 1921              Hayden Rasmussen, MD 06/18/21 [phone]

## 2021-06-17 NOTE — Discharge Instructions (Signed)
You were seen in the emergency department for recurrent abdominal pain.  Your lab work did not show any evidence of pancreatitis but your liver numbers were mildly elevated.  Please contact GI for outpatient follow-up, Dr. Karilyn Cota.  Continue your regular medications.  Return if any worsening or concerning symptoms ?

## 2021-06-17 NOTE — ED Triage Notes (Signed)
Abdominal pain x 1 day with nausea and vomiting. Pt reports hx of pancreatitis. ?

## 2021-08-23 ENCOUNTER — Encounter (HOSPITAL_COMMUNITY): Payer: Self-pay | Admitting: Emergency Medicine

## 2021-08-23 ENCOUNTER — Emergency Department (HOSPITAL_COMMUNITY)
Admission: EM | Admit: 2021-08-23 | Discharge: 2021-08-23 | Disposition: A | Discharge status: Home / Self Care | Payer: Medicaid Other | Attending: Emergency Medicine | Admitting: Emergency Medicine

## 2021-08-23 ENCOUNTER — Other Ambulatory Visit: Payer: Self-pay

## 2021-08-23 DIAGNOSIS — I10 Essential (primary) hypertension: Secondary | ICD-10-CM | POA: Insufficient documentation

## 2021-08-23 DIAGNOSIS — R112 Nausea with vomiting, unspecified: Secondary | ICD-10-CM | POA: Insufficient documentation

## 2021-08-23 DIAGNOSIS — R410 Disorientation, unspecified: Secondary | ICD-10-CM | POA: Insufficient documentation

## 2021-08-23 DIAGNOSIS — R1013 Epigastric pain: Secondary | ICD-10-CM | POA: Diagnosis not present

## 2021-08-23 LAB — CBC WITH DIFFERENTIAL/PLATELET
Abs Immature Granulocytes: 0.03 10*3/uL (ref 0.00–0.07)
Basophils Absolute: 0.1 10*3/uL (ref 0.0–0.1)
Basophils Relative: 1 %
Eosinophils Absolute: 0.2 10*3/uL (ref 0.0–0.5)
Eosinophils Relative: 2 %
HCT: 41.6 % (ref 36.0–46.0)
Hemoglobin: 13.7 g/dL (ref 12.0–15.0)
Immature Granulocytes: 0 %
Lymphocytes Relative: 29 %
Lymphs Abs: 2.8 10*3/uL (ref 0.7–4.0)
MCH: 30 pg (ref 26.0–34.0)
MCHC: 32.9 g/dL (ref 30.0–36.0)
MCV: 91 fL (ref 80.0–100.0)
Monocytes Absolute: 0.7 10*3/uL (ref 0.1–1.0)
Monocytes Relative: 8 %
Neutro Abs: 5.7 10*3/uL (ref 1.7–7.7)
Neutrophils Relative %: 60 %
Platelets: 325 10*3/uL (ref 150–400)
RBC: 4.57 MIL/uL (ref 3.87–5.11)
RDW: 13 % (ref 11.5–15.5)
WBC: 9.4 10*3/uL (ref 4.0–10.5)
nRBC: 0 % (ref 0.0–0.2)

## 2021-08-23 LAB — URINALYSIS, ROUTINE W REFLEX MICROSCOPIC
Bacteria, UA: NONE SEEN
Bilirubin Urine: NEGATIVE
Glucose, UA: NEGATIVE mg/dL
Ketones, ur: 5 mg/dL — AB
Leukocytes,Ua: NEGATIVE
Nitrite: NEGATIVE
Protein, ur: NEGATIVE mg/dL
Specific Gravity, Urine: 1.011 (ref 1.005–1.030)
pH: 5 (ref 5.0–8.0)

## 2021-08-23 LAB — I-STAT CHEM 8, ED
BUN: 9 mg/dL (ref 6–20)
Calcium, Ion: 1.17 mmol/L (ref 1.15–1.40)
Chloride: 104 mmol/L (ref 98–111)
Creatinine, Ser: 0.6 mg/dL (ref 0.44–1.00)
Glucose, Bld: 96 mg/dL (ref 70–99)
HCT: 43 % (ref 36.0–46.0)
Hemoglobin: 14.6 g/dL (ref 12.0–15.0)
Potassium: 3.8 mmol/L (ref 3.5–5.1)
Sodium: 137 mmol/L (ref 135–145)
TCO2: 23 mmol/L (ref 22–32)

## 2021-08-23 LAB — I-STAT BETA HCG BLOOD, ED (MC, WL, AP ONLY): I-stat hCG, quantitative: <5 m[IU]/mL (ref <5)

## 2021-08-23 LAB — LIPASE, BLOOD: Lipase: 22 U/L (ref 11–51)

## 2021-08-23 MED ORDER — HYDROMORPHONE HCL 1 MG/ML IJ SOLN
0.5000 mg | Freq: Once | INTRAMUSCULAR | Status: AC
  Administered 2021-08-23: 0.5 mg via INTRAVENOUS
  Filled 2021-08-23: qty 0.5

## 2021-08-23 MED ORDER — ONDANSETRON HCL 4 MG/2ML IJ SOLN
4.0000 mg | Freq: Once | INTRAMUSCULAR | Status: AC
  Administered 2021-08-23: 4 mg via INTRAVENOUS
  Filled 2021-08-23: qty 2

## 2021-08-23 MED ORDER — SODIUM CHLORIDE 0.9 % IV BOLUS
1000.0000 mL | Freq: Once | INTRAVENOUS | Status: AC
  Administered 2021-08-23: 1000 mL via INTRAVENOUS

## 2021-08-23 MED ORDER — HYDROMORPHONE HCL 1 MG/ML IJ SOLN
1.0000 mg | Freq: Once | INTRAMUSCULAR | Status: DC
Start: 2021-08-23 — End: 2021-08-23
  Filled 2021-08-23: qty 1

## 2021-08-23 NOTE — Discharge Instructions (Addendum)
Continue clear liquid diet for pancreatic symptoms.  You can take Tylenol as needed for pain.  I will prescribe Zofran for at home use for nausea.  Please follow-up with your primary care physician for further management of symptoms.  We will attach information packet regarding chronic pancreatitis.  Please do not hesitate to return to the emergency department if the worrisome signs and symptoms we discussed become apparent. ?

## 2021-08-23 NOTE — ED Provider Notes (Signed)
?[facility] EMERGENCY DEPARTMENT ?Provider Note ? ? ?CSN: [id number] ?Arrival date & time: 08/23/21  1313 ? ?  ? ?History ? ?Chief Complaint  ?Patient presents with  ? Abdominal Pain  ? ? ?[name] is a 36 y.o. female. ? ? ?Abdominal Pain ?Associated symptoms: no chest pain, no chills, no cough, no dysuria and no shortness of breath   ? ?36 year old female presents emergency department with less than 1 day history of abdominal pain, nausea vomiting.  Patient states she woke up around 2 to 3 AM this morning with severe abdominal pain and started vomiting not long thereafter.  She states that she vomits immediately after trying to consume any liquids or foods.  She has had 3-4 episodes of vomiting since symptom onset this morning.  She has had minimal to no relief.  Abdominal pain is in the epigastric area and is described as sharp and radiating to the back.  Symptoms are worsened with consumption of any food or liquid.  She has a past medical history significant for chronic pancreatitis with multiple acute events.  She has had a ED care plan of with most recent update on 09/16/2018.  Her most recent hospital visit was from 06/17/2021 with similar symptoms.  She denies chest pain, shortness of breath, fever, chills, night sweats, urinary symptoms, vaginal symptoms, change in bowel habits.  She denies recent alcohol use says she has abstained since her first diagnosis of pancreatitis. ? ?Patient has past medical history significant for opiate dependence, anemia, chronic abdominal pain, gastritis, GERD, hypertension, chronic pancreatitis, tobacco use ? ?Home Medications ?Prior to Admission medications   ?Medication Sig Start Date End Date Taking? Authorizing Provider  ?acetaminophen (TYLENOL) 500 MG tablet Take 1,000 mg by mouth every 6 (six) hours as needed.    [provider]  ?albuterol (VENTOLIN HFA) 108 (90 Base) MCG/ACT inhaler Inhale 2 puffs into the lungs every 2 (two) hours as needed for wheezing or  shortness of breath (cough). ?Patient not taking: Reported on 03/30/2021 12/17/20   Muthersbaugh, Dahlia ClientHannah, PA-C  ?benzonatate (TESSALON PERLES) 100 MG capsule Take 1 capsule (100 mg total) by mouth 3 (three) times daily as needed for cough (cough). ?Patient not taking: Reported on 03/30/2021 12/17/20   Muthersbaugh, Dahlia ClientHannah, PA-C  ?diazepam (VALIUM) 10 MG tablet Take 1 tablet (10 mg total) by mouth every 6 (six) hours as needed (Muscle spasm). ?Patient not taking: Reported on 06/17/2021 03/30/21   Mancel BaleWentz, Elliott, MD  ?diclofenac Sodium (VOLTAREN) 1 % GEL Apply 2 g topically 4 (four) times daily as needed (knee pain).    [provider]  ?nirmatrelvir/ritonavir EUA (PAXLOVID) 20 x 150 MG & 10 x 100MG  TABS Take nirmatrelvir (150 mg) two tablets twice daily for 5 days and ritonavir (100 mg) one tablet twice daily for 5 days. ?Patient not taking: Reported on 03/30/2021 12/17/20   Muthersbaugh, Dahlia ClientHannah, PA-C  ?ondansetron (ZOFRAN) 4 MG tablet Take 1 tablet (4 mg total) by mouth every 8 (eight) hours as needed for nausea or vomiting. ?Patient not taking: Reported on 03/30/2021 12/17/20   Muthersbaugh, Dahlia ClientHannah, PA-C  ?oxyCODONE-acetaminophen (PERCOCET/ROXICET) 5-325 MG tablet Take 1 tablet by mouth every 6 (six) hours as needed for severe pain. 06/17/21   Terrilee FilesButler, Michael C, MD  ?pantoprazole (PROTONIX) 40 MG tablet Take 40 mg by mouth daily.    [provider]  ?promethazine (PHENERGAN) 25 MG tablet Take 25 mg by mouth every 4 (four) hours as needed for nausea or vomiting. 11/13/19   [provider]  ?   ? ?Allergies    ?Bee venom, Other, Fentanyl, Morphine, Toradol [ketorolac tromethamine], and Reglan [metoclopramide]   ? ?Review of Systems   ?Review of Systems  ?Constitutional:  Negative for chills.  ?HENT:  Negative for congestion.   ?Respiratory:  Negative for cough and shortness of breath.   ?Cardiovascular:  Negative for chest pain and palpitations.  ?Gastrointestinal:  Positive for abdominal pain.   ?Genitourinary:  Negative for dysuria.  ?Skin:  Negative for color change and wound.  ?Neurological:  Negative for dizziness, syncope and headaches.  ? ?Physical Exam ?Updated Vital Signs ?BP (!) 174/96 (BP Location: Right Arm)   Pulse 93   Temp 98.1 ?F (36.7 ?C) (Oral)   Resp 18   Ht 5\' 4"  (1.626 m)   Wt 49.9 kg   LMP 08/02/2021   SpO2 100%   BMI 18.88 kg/m?  ?Physical Exam ?Vitals and nursing note reviewed.  ?Constitutional:   ?   Appearance: She is well-developed and normal weight.  ?   Comments: Patient had no observable pain.  She states bent over and states that leaning back exacerbates her pain.  ?HENT:  ?   Head: Normocephalic and atraumatic.  ?   Mouth/Throat:  ?   Mouth: Mucous membranes are moist.  ?Eyes:  ?   Extraocular Movements: Extraocular movements intact.  ?   Pupils: Pupils are equal, round, and reactive to light.  ?Cardiovascular:  ?   Rate and Rhythm: Normal rate and regular rhythm.  ?   Heart sounds: Normal heart sounds.  ?Pulmonary:  ?   Effort: Pulmonary effort is normal.  ?   Breath sounds: Normal breath sounds.  ?Abdominal:  ?   General: Abdomen is flat. Bowel sounds are normal.  ?   Palpations: Abdomen is soft.  ?   Tenderness: There is abdominal tenderness in the epigastric area. There is no right CVA tenderness, left CVA tenderness, guarding or rebound. Negative signs include Murphy's sign, Rovsing's sign and McBurney's sign.  ?   Comments: No overlying skin abnormalities including 08/04/2021 or Cullen sign.   ?Skin: ?   General: Skin is warm and dry.  ?   Capillary Refill: Capillary refill takes less than 2 seconds.  ?Neurological:  ?   Mental Status: She is alert. She is disoriented.  ? ? ?ED Results / Procedures / Treatments   ?Labs ?(all labs ordered are listed, but only abnormal results are displayed) ?Labs Reviewed  ?CBC WITH DIFFERENTIAL/PLATELET  ?LIPASE, BLOOD  ?URINALYSIS, ROUTINE W REFLEX MICROSCOPIC  ?I-STAT CHEM 8, ED  ?I-STAT BETA HCG BLOOD, ED ([facility], [facility], [facility] ONLY)   ? ? ?EKG ?None ? ?Radiology ?No results found. ? ?Procedures ?Procedures  ? ? ?Medications Ordered in ED ?Medications  ?ondansetron (ZOFRAN) injection 4 mg (has no administration in time range)  ?HYDROmorphone (DILAUDID) injection 1 mg (has no administration in time range)  ?sodium chloride 0.9 % bolus 1,000 mL (has no administration in time range)  ? ?Vitals:  ? 08/23/21 1434 08/23/21 1630 08/23/21 1730  ?BP: (!) 174/96 (!) 154/114 116/90  ?Pulse: 93 71 75  ?Resp: 18 20 16   ?Temp: 98.1 ?F (36.7 ?C)    ?TempSrc: Oral    ?SpO2: 100% 100% 100%  ?Weight: 49.9 kg    ?Height: 5\' 4"  (1.626 m)    ? ? ?ED Course/ Medical Decision Making/ A&P ?  ?                        ?  Medical Decision Making ?Amount and/or Complexity of Data Reviewed ?Labs: ordered. ?Radiology: ordered. ? ?Risk ?Prescription drug management. ? ? ?This patient presents to the ED for concern of epigastric pain, this involves an extensive number of treatment options, and is a complaint that carries with it a high risk of complications and morbidity.  The differential diagnosis includes pancreatitis, myocardial infarction/ACS, gastritis, small bowel obstruction, cholecystitis, hepatitis, esophageal stricture ? ? ?Co morbidities that complicate the patient evaluation ? ?opiate dependence, anemia, chronic abdominal pain, gastritis, GERD, hypertension, chronic pancreatitis, tobacco use ? ? ?Additional history obtained: ? ?Additional history obtained from CT abdomen pelvis from 12/10/2020 ?External records from outside source obtained and reviewed including chronic calcific pancreatitis with no advanced pancreatic atrophy.  No acute inflammatory process flat in the abdomen or pelvis. ? ? ?Lab Tests: ? ?I Ordered, and personally interpreted labs.  The pertinent results include: No acute abnormalities with laboratory studies. ? ? ?Imaging Studies ordered: ? ?N/a ? ? ?Cardiac Monitoring: / EKG: ? ?The patient was maintained on a cardiac monitor.  I personally viewed  and interpreted the cardiac monitored which showed an underlying rhythm of: Normal sinus rhythm ? ? ?Consultations Obtained: ? ?N/a ? ? ?Problem List / ED Course / Critical interventions / Medication management ?

## 2021-08-23 NOTE — ED Triage Notes (Signed)
Patient c/o abd pain with nausea, vomiting, and diarrhea that started this morning and is progressively getting worse. Denies any urinary symptoms or fevers. Denies any blood in emesis or stools. Per patient hx of pancreatic issues in which this feels similar. Patient tried to take tylenol but unable to keep medication down. ?

## 2021-08-24 ENCOUNTER — Telehealth (HOSPITAL_COMMUNITY): Payer: Self-pay | Admitting: Emergency Medicine

## 2021-08-24 MED ORDER — ONDANSETRON HCL 8 MG PO TABS: 8.0000 mg | ORAL_TABLET | Freq: Three times a day (TID) | ORAL | 0 refills | Status: DC | PRN

## 2021-11-26 ENCOUNTER — Encounter (HOSPITAL_COMMUNITY): Payer: Self-pay | Admitting: Emergency Medicine

## 2021-11-26 ENCOUNTER — Other Ambulatory Visit: Payer: Self-pay

## 2021-11-26 ENCOUNTER — Emergency Department (HOSPITAL_COMMUNITY)
Admission: EM | Admit: 2021-11-26 | Discharge: 2021-11-27 | Discharge status: Against Medical Advice | Payer: Medicaid Other | Attending: Emergency Medicine | Admitting: Emergency Medicine

## 2021-11-26 DIAGNOSIS — R1084 Generalized abdominal pain: Secondary | ICD-10-CM | POA: Diagnosis not present

## 2021-11-26 DIAGNOSIS — Z5321 Procedure and treatment not carried out due to patient leaving prior to being seen by health care provider: Secondary | ICD-10-CM | POA: Insufficient documentation

## 2021-11-26 DIAGNOSIS — R109 Unspecified abdominal pain: Secondary | ICD-10-CM | POA: Insufficient documentation

## 2021-11-26 DIAGNOSIS — R112 Nausea with vomiting, unspecified: Secondary | ICD-10-CM | POA: Insufficient documentation

## 2021-11-26 LAB — COMPREHENSIVE METABOLIC PANEL
ALT: 22 U/L (ref 0–44)
AST: 17 U/L (ref 15–41)
Albumin: 4.7 g/dL (ref 3.5–5.0)
Alkaline Phosphatase: 105 U/L (ref 38–126)
Anion gap: 7 (ref 5–15)
BUN: 12 mg/dL (ref 6–20)
CO2: 24 mmol/L (ref 22–32)
Calcium: 9.5 mg/dL (ref 8.9–10.3)
Chloride: 104 mmol/L (ref 98–111)
Creatinine, Ser: 0.75 mg/dL (ref 0.44–1.00)
GFR, Estimated: >60 mL/min (ref >60)
Glucose, Bld: 123 mg/dL — ABNORMAL HIGH (ref 70–99)
Potassium: 3.6 mmol/L (ref 3.5–5.1)
Sodium: 135 mmol/L (ref 135–145)
Total Bilirubin: 0.7 mg/dL (ref 0.3–1.2)
Total Protein: 8.3 g/dL — ABNORMAL HIGH (ref 6.5–8.1)

## 2021-11-26 LAB — CBC
HCT: 43.5 % (ref 36.0–46.0)
Hemoglobin: 14.7 g/dL (ref 12.0–15.0)
MCH: 30.4 pg (ref 26.0–34.0)
MCHC: 33.8 g/dL (ref 30.0–36.0)
MCV: 90.1 fL (ref 80.0–100.0)
Platelets: 373 10*3/uL (ref 150–400)
RBC: 4.83 MIL/uL (ref 3.87–5.11)
RDW: 13.8 % (ref 11.5–15.5)
WBC: 11.4 10*3/uL — ABNORMAL HIGH (ref 4.0–10.5)
nRBC: 0 % (ref 0.0–0.2)

## 2021-11-26 LAB — LIPASE, BLOOD: Lipase: 25 U/L (ref 11–51)

## 2021-11-26 NOTE — ED Triage Notes (Signed)
Pt c/o abd pain n/v since yesterday.

## 2021-11-27 ENCOUNTER — Encounter (HOSPITAL_BASED_OUTPATIENT_CLINIC_OR_DEPARTMENT_OTHER): Payer: Self-pay | Admitting: Emergency Medicine

## 2021-11-27 ENCOUNTER — Emergency Department (HOSPITAL_BASED_OUTPATIENT_CLINIC_OR_DEPARTMENT_OTHER)
Admission: EM | Admit: 2021-11-27 | Discharge: 2021-11-27 | Disposition: A | Discharge status: Home / Self Care | Payer: Medicaid Other | Source: Home / Self Care | Attending: Emergency Medicine | Admitting: Emergency Medicine

## 2021-11-27 DIAGNOSIS — R112 Nausea with vomiting, unspecified: Secondary | ICD-10-CM | POA: Insufficient documentation

## 2021-11-27 DIAGNOSIS — R110 Nausea: Secondary | ICD-10-CM

## 2021-11-27 DIAGNOSIS — R1084 Generalized abdominal pain: Secondary | ICD-10-CM | POA: Insufficient documentation

## 2021-11-27 MED ORDER — ONDANSETRON 4 MG PO TBDP
4.0000 mg | ORAL_TABLET | Freq: Once | ORAL | Status: AC
  Administered 2021-11-27: 4 mg via ORAL
  Filled 2021-11-27: qty 1

## 2021-11-27 MED ORDER — HYDROMORPHONE HCL 1 MG/ML IJ SOLN
2.0000 mg | Freq: Once | INTRAMUSCULAR | Status: AC
  Administered 2021-11-27: 2 mg via INTRAMUSCULAR
  Filled 2021-11-27: qty 2

## 2021-11-27 MED ORDER — ONDANSETRON 4 MG PO TBDP: 4.0000 mg | ORAL_TABLET | Freq: Three times a day (TID) | ORAL | 1 refills | Status: DC | PRN

## 2021-11-27 NOTE — ED Triage Notes (Signed)
Pt here from home with c/ abd pain and some nausea was at [facility]  last night got bloos work but was unable to wait

## 2021-11-27 NOTE — ED Provider Notes (Signed)
[facility] EMERGENCY DEPT Provider Note   CSN: [id number] Arrival date & time: 11/27/21  1500     History  Chief Complaint  Patient presents with   Nausea    [name] is a 36 y.o. female.  Care plan noted.  Patient seen at [facility] last night left.  Patient with a complaint of abdominal pain and and vomiting.  States not keeping anything down.  Labs were done last night very reassuring white blood cell count 11.4 otherwise normal.  Lipase was normal liver function tests were normal electrolytes were normal.  Patient states she has diffuse abdominal pain.  Patient seen at [facility], ED prior to that May 7.  Was given hydromorphone while in the ED with improvement.  Care plan recommends avoiding CT scans because patient has had several.  This seems to fit into her normal pattern.  Past medical history seems to be significant for chronic abdominal pain.  Thought to be secondary to pancreatitis used to be followed at [facility].  History of the nausea and vomiting with it as well.  Past surgical history significant for laparoscopic tubal ligation.  Patient is an everyday smoker.       Home Medications Prior to Admission medications   Medication Sig Start Date End Date Taking? Authorizing Provider  acetaminophen (TYLENOL) 500 MG tablet Take 1,000 mg by mouth every 6 (six) hours as needed.    [provider]  ondansetron (ZOFRAN) 8 MG tablet Take 1 tablet (8 mg total) by mouth every 8 (eight) hours as needed for nausea or vomiting. 08/24/21   Mancel Bale, MD  pantoprazole (PROTONIX) 40 MG tablet Take 1 tablet by mouth daily. 08/19/17   [provider]  tiZANidine (ZANAFLEX) 4 MG capsule Take 4 mg by mouth 3 (three) times daily as needed for muscle spasms.    [provider]      Allergies    Bee venom, Other, Fentanyl, Morphine, Toradol [ketorolac tromethamine], and Reglan [metoclopramide]    Review of Systems   Review of Systems   Constitutional:  Negative for chills and fever.  HENT:  Negative for ear pain and sore throat.   Eyes:  Negative for pain and visual disturbance.  Respiratory:  Negative for cough and shortness of breath.   Cardiovascular:  Negative for chest pain and palpitations.  Gastrointestinal:  Positive for abdominal pain, nausea and vomiting.  Genitourinary:  Negative for dysuria and hematuria.  Musculoskeletal:  Negative for arthralgias and back pain.  Skin:  Negative for color change and rash.  Neurological:  Negative for seizures and syncope.  All other systems reviewed and are negative.   Physical Exam Updated Vital Signs BP (!) 121/97   Pulse 77   Temp 98.9 F (37.2 C)   Resp 20   LMP 11/16/2021   SpO2 99%  Physical Exam Vitals and nursing note reviewed.  Constitutional:      General: She is not in acute distress.    Appearance: Normal appearance. She is well-developed.  HENT:     Head: Normocephalic and atraumatic.  Eyes:     Extraocular Movements: Extraocular movements intact.     Conjunctiva/sclera: Conjunctivae normal.     Pupils: Pupils are equal, round, and reactive to light.  Cardiovascular:     Rate and Rhythm: Normal rate and regular rhythm.     Heart sounds: No murmur heard. Pulmonary:     Effort: Pulmonary effort is normal. No respiratory distress.     Breath  sounds: Normal breath sounds.  Abdominal:     General: There is no distension.     Palpations: Abdomen is soft.     Tenderness: There is no abdominal tenderness. There is no guarding.  Musculoskeletal:        General: No swelling.     Cervical back: Neck supple.  Skin:    General: Skin is warm and dry.     Capillary Refill: Capillary refill takes less than 2 seconds.  Neurological:     General: No focal deficit present.     Mental Status: She is alert and oriented to person, place, and time.     Cranial Nerves: No cranial nerve deficit.     Sensory: No sensory deficit.  Psychiatric:        Mood and  Affect: Mood normal.     ED Results / Procedures / Treatments   Labs (all labs ordered are listed, but only abnormal results are displayed) Labs Reviewed - No data to display  EKG None  Radiology No results found.  Procedures Procedures    Medications Ordered in ED Medications  ondansetron (ZOFRAN-ODT) disintegrating tablet 4 mg (has no administration in time range)  HYDROmorphone (DILAUDID) injection 2 mg (has no administration in time range)  ondansetron (ZOFRAN-ODT) disintegrating tablet 4 mg (4 mg Oral Given 11/27/21 1609)    ED Course/ Medical Decision Making/ A&P                           Medical Decision Making Risk Prescription drug management.   Clinically abdomen is flat.  Does not really concerned about obstruction there has not been any multiple episodes of vomiting here.  I will go ahead and treat with some IM pain medicine Zofran.  Labs reassuring other than white count being a little higher than her baseline of 10 at 11.4.  Patient's vital signs reassuring no fever.  Heart rate low 100s blood pressure is good respirations not up.  We will reassess after patient receives the pain medicine.  I think this may be a component of her chronic pain.  Patient will be discharged home with Zofran prescription.   Final Clinical Impression(s) / ED Diagnoses Final diagnoses:  Generalized abdominal pain    Rx / DC Orders ED Discharge Orders     None         Vanetta Mulders, MD 11/27/21 [phone]

## 2021-11-27 NOTE — ED Notes (Signed)
Reviewed AVS/discharge instruction with patient. Time allotted for and all questions answered. Patient is agreeable for d/c and escorted to ed exit by staff.  

## 2021-11-27 NOTE — ED Notes (Signed)
Patient expressed increased pain. Heat pack given to patient for comfort and provider notified

## 2021-11-27 NOTE — Discharge Instructions (Signed)
Labs today without significant changes.  Recommend taking the Zofran for the nausea vomiting.  Referral information provided for GI medicine for follow-up for the persistent abdominal pain.

## 2021-12-22 ENCOUNTER — Emergency Department (HOSPITAL_COMMUNITY)
Admission: EM | Admit: 2021-12-22 | Discharge: 2021-12-22 | Disposition: A | Discharge status: Home / Self Care | Payer: Medicaid Other | Attending: Emergency Medicine | Admitting: Emergency Medicine

## 2021-12-22 ENCOUNTER — Other Ambulatory Visit: Payer: Self-pay

## 2021-12-22 ENCOUNTER — Encounter (HOSPITAL_COMMUNITY): Payer: Self-pay

## 2021-12-22 DIAGNOSIS — I10 Essential (primary) hypertension: Secondary | ICD-10-CM | POA: Insufficient documentation

## 2021-12-22 DIAGNOSIS — R1013 Epigastric pain: Secondary | ICD-10-CM | POA: Insufficient documentation

## 2021-12-22 DIAGNOSIS — R109 Unspecified abdominal pain: Secondary | ICD-10-CM | POA: Diagnosis present

## 2021-12-22 LAB — COMPREHENSIVE METABOLIC PANEL
ALT: 25 U/L (ref 0–44)
AST: 20 U/L (ref 15–41)
Albumin: 3.8 g/dL (ref 3.5–5.0)
Alkaline Phosphatase: 95 U/L (ref 38–126)
Anion gap: 5 (ref 5–15)
BUN: 6 mg/dL (ref 6–20)
CO2: 25 mmol/L (ref 22–32)
Calcium: 9.3 mg/dL (ref 8.9–10.3)
Chloride: 109 mmol/L (ref 98–111)
Creatinine, Ser: 0.57 mg/dL (ref 0.44–1.00)
GFR, Estimated: >60 mL/min (ref >60)
Glucose, Bld: 106 mg/dL — ABNORMAL HIGH (ref 70–99)
Potassium: 4.7 mmol/L (ref 3.5–5.1)
Sodium: 139 mmol/L (ref 135–145)
Total Bilirubin: 0.4 mg/dL (ref 0.3–1.2)
Total Protein: 7 g/dL (ref 6.5–8.1)

## 2021-12-22 LAB — PREGNANCY, URINE: Preg Test, Ur: NEGATIVE

## 2021-12-22 LAB — URINALYSIS, ROUTINE W REFLEX MICROSCOPIC
Bilirubin Urine: NEGATIVE
Glucose, UA: NEGATIVE mg/dL
Hgb urine dipstick: NEGATIVE
Ketones, ur: NEGATIVE mg/dL
Leukocytes,Ua: NEGATIVE
Nitrite: NEGATIVE
Protein, ur: NEGATIVE mg/dL
Specific Gravity, Urine: 1.009 (ref 1.005–1.030)
pH: 7 (ref 5.0–8.0)

## 2021-12-22 LAB — CBC
HCT: 41.2 % (ref 36.0–46.0)
Hemoglobin: 13.6 g/dL (ref 12.0–15.0)
MCH: 30.2 pg (ref 26.0–34.0)
MCHC: 33 g/dL (ref 30.0–36.0)
MCV: 91.4 fL (ref 80.0–100.0)
Platelets: 290 10*3/uL (ref 150–400)
RBC: 4.51 MIL/uL (ref 3.87–5.11)
RDW: 14.1 % (ref 11.5–15.5)
WBC: 7.7 10*3/uL (ref 4.0–10.5)
nRBC: 0 % (ref 0.0–0.2)

## 2021-12-22 LAB — LIPASE, BLOOD: Lipase: 29 U/L (ref 11–51)

## 2021-12-22 MED ORDER — HYDROMORPHONE HCL 1 MG/ML IJ SOLN
1.0000 mg | Freq: Once | INTRAMUSCULAR | Status: AC
  Administered 2021-12-22: 1 mg via INTRAVENOUS
  Filled 2021-12-22: qty 1

## 2021-12-22 MED ORDER — SODIUM CHLORIDE 0.9 % IV BOLUS
1000.0000 mL | Freq: Once | INTRAVENOUS | Status: AC
Start: 2021-12-22 — End: 2021-12-22
  Administered 2021-12-22: 1000 mL via INTRAVENOUS

## 2021-12-22 MED ORDER — ONDANSETRON HCL 4 MG/2ML IJ SOLN
4.0000 mg | Freq: Once | INTRAMUSCULAR | Status: AC
  Administered 2021-12-22: 4 mg via INTRAVENOUS
  Filled 2021-12-22: qty 2

## 2021-12-22 MED ORDER — OXYCODONE-ACETAMINOPHEN 5-325 MG PO TABS: 1.0000 | ORAL_TABLET | Freq: Four times a day (QID) | ORAL | 0 refills | Status: DC | PRN

## 2021-12-22 MED ORDER — HYDROMORPHONE HCL 1 MG/ML IJ SOLN
1.0000 mg | Freq: Once | INTRAMUSCULAR | Status: AC
  Administered 2021-12-22: 1 mg via INTRAMUSCULAR
  Filled 2021-12-22: qty 1

## 2021-12-22 MED ORDER — ONDANSETRON 4 MG PO TBDP: 4.0000 mg | ORAL_TABLET | Freq: Three times a day (TID) | ORAL | 0 refills | Status: DC | PRN

## 2021-12-22 MED ORDER — DIPHENHYDRAMINE HCL 50 MG/ML IJ SOLN
25.0000 mg | Freq: Once | INTRAMUSCULAR | Status: AC
Start: 2021-12-22 — End: 2021-12-22
  Administered 2021-12-22: 25 mg via INTRAVENOUS
  Filled 2021-12-22: qty 1
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
## 2021-12-22 NOTE — ED Triage Notes (Signed)
Pt presents to ED with complaints of abdominal pain radiating into back, diarrhea and nausea started yesterday. Pt with history of pancreatitis.

## 2022-01-20 ENCOUNTER — Other Ambulatory Visit: Payer: Self-pay

## 2022-01-20 ENCOUNTER — Encounter (HOSPITAL_COMMUNITY): Payer: Self-pay | Admitting: Emergency Medicine

## 2022-01-20 ENCOUNTER — Emergency Department (HOSPITAL_COMMUNITY)
Admission: EM | Admit: 2022-01-20 | Discharge: 2022-01-21 | Disposition: A | Discharge status: Home / Self Care | Payer: Medicaid Other | Attending: Emergency Medicine | Admitting: Emergency Medicine

## 2022-01-20 DIAGNOSIS — R1013 Epigastric pain: Secondary | ICD-10-CM

## 2022-01-20 DIAGNOSIS — K859 Acute pancreatitis without necrosis or infection, unspecified: Secondary | ICD-10-CM | POA: Insufficient documentation

## 2022-01-20 LAB — URINALYSIS, ROUTINE W REFLEX MICROSCOPIC
Bilirubin Urine: NEGATIVE
Glucose, UA: NEGATIVE mg/dL
Hgb urine dipstick: NEGATIVE
Ketones, ur: NEGATIVE mg/dL
Leukocytes,Ua: NEGATIVE
Nitrite: NEGATIVE
Protein, ur: NEGATIVE mg/dL
Specific Gravity, Urine: 1.014 (ref 1.005–1.030)
pH: 5 (ref 5.0–8.0)

## 2022-01-20 LAB — CBC WITH DIFFERENTIAL/PLATELET
Abs Immature Granulocytes: 0.02 10*3/uL (ref 0.00–0.07)
Basophils Absolute: 0.1 10*3/uL (ref 0.0–0.1)
Basophils Relative: 1 %
Eosinophils Absolute: 0.9 10*3/uL — ABNORMAL HIGH (ref 0.0–0.5)
Eosinophils Relative: 9 %
HCT: 40 % (ref 36.0–46.0)
Hemoglobin: 13.5 g/dL (ref 12.0–15.0)
Immature Granulocytes: 0 %
Lymphocytes Relative: 32 %
Lymphs Abs: 3.3 10*3/uL (ref 0.7–4.0)
MCH: 30.8 pg (ref 26.0–34.0)
MCHC: 33.8 g/dL (ref 30.0–36.0)
MCV: 91.1 fL (ref 80.0–100.0)
Monocytes Absolute: 0.9 10*3/uL (ref 0.1–1.0)
Monocytes Relative: 8 %
Neutro Abs: 5.3 10*3/uL (ref 1.7–7.7)
Neutrophils Relative %: 50 %
Platelets: 326 10*3/uL (ref 150–400)
RBC: 4.39 MIL/uL (ref 3.87–5.11)
RDW: 14.4 % (ref 11.5–15.5)
WBC: 10.4 10*3/uL (ref 4.0–10.5)
nRBC: 0 % (ref 0.0–0.2)

## 2022-01-20 LAB — COMPREHENSIVE METABOLIC PANEL
ALT: 18 U/L (ref 0–44)
AST: 17 U/L (ref 15–41)
Albumin: 4 g/dL (ref 3.5–5.0)
Alkaline Phosphatase: 84 U/L (ref 38–126)
Anion gap: 7 (ref 5–15)
BUN: 10 mg/dL (ref 6–20)
CO2: 23 mmol/L (ref 22–32)
Calcium: 9 mg/dL (ref 8.9–10.3)
Chloride: 108 mmol/L (ref 98–111)
Creatinine, Ser: 0.72 mg/dL (ref 0.44–1.00)
GFR, Estimated: >60 mL/min (ref >60)
Glucose, Bld: 120 mg/dL — ABNORMAL HIGH (ref 70–99)
Potassium: 3.8 mmol/L (ref 3.5–5.1)
Sodium: 138 mmol/L (ref 135–145)
Total Bilirubin: 0.6 mg/dL (ref 0.3–1.2)
Total Protein: 7.1 g/dL (ref 6.5–8.1)

## 2022-01-20 LAB — PREGNANCY, URINE: Preg Test, Ur: NEGATIVE

## 2022-01-20 LAB — LIPASE, BLOOD: Lipase: 29 U/L (ref 11–51)

## 2022-01-20 MED ORDER — ONDANSETRON HCL 4 MG/2ML IJ SOLN
4.0000 mg | Freq: Once | INTRAMUSCULAR | Status: AC
  Administered 2022-01-20: 4 mg via INTRAVENOUS
  Filled 2022-01-20: qty 2

## 2022-01-20 MED ORDER — SODIUM CHLORIDE 0.9 % IV BOLUS
1000.0000 mL | Freq: Once | INTRAVENOUS | Status: AC
  Administered 2022-01-20: 1000 mL via INTRAVENOUS

## 2022-01-20 MED ORDER — HYDROMORPHONE HCL 1 MG/ML IJ SOLN
1.0000 mg | Freq: Once | INTRAMUSCULAR | Status: AC
  Administered 2022-01-20: 1 mg via INTRAVENOUS
  Filled 2022-01-20: qty 1
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
## 2022-01-21 MED ORDER — OXYCODONE-ACETAMINOPHEN 5-325 MG PO TABS
2.0000 | ORAL_TABLET | Freq: Once | ORAL | Status: AC
  Administered 2022-01-21: 2 via ORAL
  Filled 2022-01-21: qty 2

## 2022-01-21 NOTE — ED Notes (Signed)
Patient verbalizes understanding of discharge instructions. Opportunity for questioning and answers were provided. Armband removed by staff, pt discharged from ED. Ambulated out to lobby with spouse  

## 2022-02-19 ENCOUNTER — Encounter (HOSPITAL_COMMUNITY): Payer: Self-pay | Admitting: Emergency Medicine

## 2022-02-19 ENCOUNTER — Emergency Department (HOSPITAL_COMMUNITY)
Admission: EM | Admit: 2022-02-19 | Discharge: 2022-02-20 | Disposition: A | Discharge status: Home / Self Care | Payer: Medicaid Other | Attending: Emergency Medicine | Admitting: Emergency Medicine

## 2022-02-19 DIAGNOSIS — R197 Diarrhea, unspecified: Secondary | ICD-10-CM | POA: Diagnosis not present

## 2022-02-19 DIAGNOSIS — F17210 Nicotine dependence, cigarettes, uncomplicated: Secondary | ICD-10-CM | POA: Diagnosis not present

## 2022-02-19 DIAGNOSIS — R112 Nausea with vomiting, unspecified: Secondary | ICD-10-CM | POA: Diagnosis not present

## 2022-02-19 DIAGNOSIS — R1013 Epigastric pain: Secondary | ICD-10-CM | POA: Diagnosis present

## 2022-02-19 DIAGNOSIS — I10 Essential (primary) hypertension: Secondary | ICD-10-CM | POA: Insufficient documentation

## 2022-02-19 LAB — URINALYSIS, ROUTINE W REFLEX MICROSCOPIC
Bilirubin Urine: NEGATIVE
Glucose, UA: NEGATIVE mg/dL
Hgb urine dipstick: NEGATIVE
Ketones, ur: 5 mg/dL — AB
Leukocytes,Ua: NEGATIVE
Nitrite: NEGATIVE
Protein, ur: NEGATIVE mg/dL
Specific Gravity, Urine: 1.017 (ref 1.005–1.030)
pH: 5 (ref 5.0–8.0)

## 2022-02-19 LAB — CBC
HCT: 41.8 % (ref 36.0–46.0)
Hemoglobin: 14.1 g/dL (ref 12.0–15.0)
MCH: 30.7 pg (ref 26.0–34.0)
MCHC: 33.7 g/dL (ref 30.0–36.0)
MCV: 91.1 fL (ref 80.0–100.0)
Platelets: 334 10*3/uL (ref 150–400)
RBC: 4.59 MIL/uL (ref 3.87–5.11)
RDW: 14.6 % (ref 11.5–15.5)
WBC: 9.7 10*3/uL (ref 4.0–10.5)
nRBC: 0 % (ref 0.0–0.2)

## 2022-02-19 LAB — COMPREHENSIVE METABOLIC PANEL
ALT: 13 U/L (ref 0–44)
AST: 15 U/L (ref 15–41)
Albumin: 4.3 g/dL (ref 3.5–5.0)
Alkaline Phosphatase: 85 U/L (ref 38–126)
Anion gap: 6 (ref 5–15)
BUN: 9 mg/dL (ref 6–20)
CO2: 26 mmol/L (ref 22–32)
Calcium: 9 mg/dL (ref 8.9–10.3)
Chloride: 105 mmol/L (ref 98–111)
Creatinine, Ser: 0.85 mg/dL (ref 0.44–1.00)
GFR, Estimated: >60 mL/min (ref >60)
Glucose, Bld: 123 mg/dL — ABNORMAL HIGH (ref 70–99)
Potassium: 3.7 mmol/L (ref 3.5–5.1)
Sodium: 137 mmol/L (ref 135–145)
Total Bilirubin: 0.6 mg/dL (ref 0.3–1.2)
Total Protein: 7.4 g/dL (ref 6.5–8.1)

## 2022-02-19 LAB — LIPASE, BLOOD: Lipase: 32 U/L (ref 11–51)

## 2022-02-19 LAB — POC URINE PREG, ED: Preg Test, Ur: NEGATIVE

## 2022-02-19 MED ORDER — FAMOTIDINE IN NACL 20-0.9 MG/50ML-% IV SOLN
20.0000 mg | Freq: Once | INTRAVENOUS | Status: AC
  Administered 2022-02-19: 20 mg via INTRAVENOUS
  Filled 2022-02-19: qty 50

## 2022-02-19 MED ORDER — HYDROMORPHONE HCL 1 MG/ML IJ SOLN
1.0000 mg | Freq: Once | INTRAMUSCULAR | Status: AC
  Administered 2022-02-19: 1 mg via INTRAVENOUS
  Filled 2022-02-19: qty 1

## 2022-02-19 MED ORDER — HALOPERIDOL LACTATE 5 MG/ML IJ SOLN
2.0000 mg | Freq: Once | INTRAMUSCULAR | Status: AC
  Administered 2022-02-19: 2 mg via INTRAVENOUS
  Filled 2022-02-19: qty 1

## 2022-02-19 MED ORDER — SODIUM CHLORIDE 0.9 % IV BOLUS
1000.0000 mL | Freq: Once | INTRAVENOUS | Status: AC
  Administered 2022-02-19: 1000 mL via INTRAVENOUS
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
## 2022-02-20 NOTE — Discharge Instructions (Signed)
You were evaluated in the Emergency Department and after careful evaluation, we did not find any emergent condition requiring admission or further testing in the hospital.  Your exam/testing today was overall reassuring.  Recommend for follow-up with your regular doctors to further manage your symptoms.  Please return to the Emergency Department if you experience any worsening of your condition.  Thank you for allowing [place] to be a part of your care.

## 2022-03-06 ENCOUNTER — Encounter (HOSPITAL_COMMUNITY): Payer: Self-pay

## 2022-03-06 ENCOUNTER — Emergency Department (HOSPITAL_COMMUNITY)
Admission: EM | Admit: 2022-03-06 | Discharge: 2022-03-06 | Discharge status: Against Medical Advice | Payer: Medicaid Other | Attending: Emergency Medicine | Admitting: Emergency Medicine

## 2022-03-06 ENCOUNTER — Other Ambulatory Visit: Payer: Self-pay

## 2022-03-06 DIAGNOSIS — R000 Tachycardia, unspecified: Secondary | ICD-10-CM | POA: Diagnosis not present

## 2022-03-06 DIAGNOSIS — R1013 Epigastric pain: Secondary | ICD-10-CM | POA: Insufficient documentation

## 2022-03-06 DIAGNOSIS — I10 Essential (primary) hypertension: Secondary | ICD-10-CM | POA: Diagnosis not present

## 2022-03-06 DIAGNOSIS — Z5329 Procedure and treatment not carried out because of patient's decision for other reasons: Secondary | ICD-10-CM | POA: Insufficient documentation

## 2022-03-06 DIAGNOSIS — D72829 Elevated white blood cell count, unspecified: Secondary | ICD-10-CM | POA: Diagnosis not present

## 2022-03-06 DIAGNOSIS — R1012 Left upper quadrant pain: Secondary | ICD-10-CM | POA: Insufficient documentation

## 2022-03-06 DIAGNOSIS — R112 Nausea with vomiting, unspecified: Secondary | ICD-10-CM | POA: Diagnosis not present

## 2022-03-06 LAB — URINALYSIS, ROUTINE W REFLEX MICROSCOPIC
Bilirubin Urine: NEGATIVE
Glucose, UA: NEGATIVE mg/dL
Hgb urine dipstick: NEGATIVE
Ketones, ur: NEGATIVE mg/dL
Leukocytes,Ua: NEGATIVE
Nitrite: NEGATIVE
Protein, ur: NEGATIVE mg/dL
Specific Gravity, Urine: 1.01 (ref 1.005–1.030)
pH: 5 (ref 5.0–8.0)

## 2022-03-06 LAB — CBC WITH DIFFERENTIAL/PLATELET
Abs Immature Granulocytes: 0.02 10*3/uL (ref 0.00–0.07)
Basophils Absolute: 0.1 10*3/uL (ref 0.0–0.1)
Basophils Relative: 1 %
Eosinophils Absolute: 0.5 10*3/uL (ref 0.0–0.5)
Eosinophils Relative: 4 %
HCT: 43.2 % (ref 36.0–46.0)
Hemoglobin: 14.7 g/dL (ref 12.0–15.0)
Immature Granulocytes: 0 %
Lymphocytes Relative: 23 %
Lymphs Abs: 2.8 10*3/uL (ref 0.7–4.0)
MCH: 30.3 pg (ref 26.0–34.0)
MCHC: 34 g/dL (ref 30.0–36.0)
MCV: 89.1 fL (ref 80.0–100.0)
Monocytes Absolute: 0.9 10*3/uL (ref 0.1–1.0)
Monocytes Relative: 8 %
Neutro Abs: 7.9 10*3/uL — ABNORMAL HIGH (ref 1.7–7.7)
Neutrophils Relative %: 64 %
Platelets: 311 10*3/uL (ref 150–400)
RBC: 4.85 MIL/uL (ref 3.87–5.11)
RDW: 14.3 % (ref 11.5–15.5)
WBC: 12.2 10*3/uL — ABNORMAL HIGH (ref 4.0–10.5)
nRBC: 0 % (ref 0.0–0.2)

## 2022-03-06 LAB — COMPREHENSIVE METABOLIC PANEL
ALT: 25 U/L (ref 0–44)
AST: 19 U/L (ref 15–41)
Albumin: 4.6 g/dL (ref 3.5–5.0)
Alkaline Phosphatase: 98 U/L (ref 38–126)
Anion gap: 10 (ref 5–15)
BUN: 11 mg/dL (ref 6–20)
CO2: 21 mmol/L — ABNORMAL LOW (ref 22–32)
Calcium: 9.1 mg/dL (ref 8.9–10.3)
Chloride: 102 mmol/L (ref 98–111)
Creatinine, Ser: 0.63 mg/dL (ref 0.44–1.00)
GFR, Estimated: >60 mL/min (ref >60)
Glucose, Bld: 109 mg/dL — ABNORMAL HIGH (ref 70–99)
Potassium: 3.9 mmol/L (ref 3.5–5.1)
Sodium: 133 mmol/L — ABNORMAL LOW (ref 135–145)
Total Bilirubin: 0.5 mg/dL (ref 0.3–1.2)
Total Protein: 8 g/dL (ref 6.5–8.1)

## 2022-03-06 LAB — RAPID URINE DRUG SCREEN, HOSP PERFORMED
Amphetamines: NOT DETECTED
Barbiturates: NOT DETECTED
Benzodiazepines: NOT DETECTED
Cocaine: NOT DETECTED
Opiates: NOT DETECTED
Tetrahydrocannabinol: POSITIVE — AB

## 2022-03-06 LAB — PREGNANCY, URINE: Preg Test, Ur: NEGATIVE

## 2022-03-06 LAB — LIPASE, BLOOD: Lipase: 25 U/L (ref 11–51)

## 2022-03-06 MED ORDER — ONDANSETRON HCL 4 MG/2ML IJ SOLN
4.0000 mg | Freq: Once | INTRAMUSCULAR | Status: AC
  Administered 2022-03-06: 4 mg via INTRAVENOUS
  Filled 2022-03-06: qty 2

## 2022-03-06 MED ORDER — CAPSAICIN 0.025 % EX CREA
TOPICAL_CREAM | Freq: Two times a day (BID) | CUTANEOUS | Status: DC
  Filled 2022-03-06: qty 60

## 2022-03-06 MED ORDER — HYDROMORPHONE HCL 1 MG/ML IJ SOLN
1.0000 mg | Freq: Once | INTRAMUSCULAR | Status: AC
  Administered 2022-03-06: 1 mg via INTRAVENOUS
  Filled 2022-03-06: qty 1

## 2022-03-06 MED ORDER — SODIUM CHLORIDE 0.9 % IV BOLUS
1000.0000 mL | Freq: Once | INTRAVENOUS | Status: AC
  Administered 2022-03-06: 1000 mL via INTRAVENOUS

## 2022-03-06 MED ORDER — HALOPERIDOL LACTATE 5 MG/ML IJ SOLN
5.0000 mg | Freq: Once | INTRAMUSCULAR | Status: DC
  Filled 2022-03-06: qty 1

## 2022-03-06 NOTE — ED Triage Notes (Signed)
Patient arrives from home POV c/o abdominal pain that started last night. Pt reports diarrhea, n/v, dry heaving. Pt states she has hx of chronic pancreatitis. Pt states abd pain is 10/10.

## 2022-03-06 NOTE — ED Notes (Addendum)
Pt requested to leave AMA because she could not have  dilaudid.
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
## 2022-04-09 ENCOUNTER — Emergency Department (HOSPITAL_COMMUNITY)
Admission: EM | Admit: 2022-04-09 | Discharge: 2022-04-09 | Disposition: A | Discharge status: Home / Self Care | Payer: Medicaid Other | Attending: Emergency Medicine | Admitting: Emergency Medicine

## 2022-04-09 ENCOUNTER — Encounter (HOSPITAL_COMMUNITY): Payer: Self-pay

## 2022-04-09 ENCOUNTER — Emergency Department (HOSPITAL_COMMUNITY): Payer: Medicaid Other

## 2022-04-09 DIAGNOSIS — Z20822 Contact with and (suspected) exposure to covid-19: Secondary | ICD-10-CM | POA: Insufficient documentation

## 2022-04-09 DIAGNOSIS — J101 Influenza due to other identified influenza virus with other respiratory manifestations: Secondary | ICD-10-CM | POA: Diagnosis not present

## 2022-04-09 DIAGNOSIS — R059 Cough, unspecified: Secondary | ICD-10-CM | POA: Diagnosis present

## 2022-04-09 DIAGNOSIS — R112 Nausea with vomiting, unspecified: Secondary | ICD-10-CM

## 2022-04-09 LAB — RESP PANEL BY RT-PCR (RSV, FLU A&B, COVID)  RVPGX2
Influenza A by PCR: NEGATIVE
Influenza B by PCR: POSITIVE — AB
Resp Syncytial Virus by PCR: NEGATIVE
SARS Coronavirus 2 by RT PCR: NEGATIVE

## 2022-04-09 LAB — POC URINE PREG, ED: Preg Test, Ur: NEGATIVE

## 2022-04-09 MED ORDER — ONDANSETRON HCL 4 MG PO TABS: 4.0000 mg | ORAL_TABLET | Freq: Four times a day (QID) | ORAL | 0 refills | Status: DC

## 2022-04-09 MED ORDER — ONDANSETRON 4 MG PO TBDP
4.0000 mg | ORAL_TABLET | Freq: Once | ORAL | Status: AC
  Administered 2022-04-09: 4 mg via ORAL
  Filled 2022-04-09: qty 1

## 2022-04-09 MED ORDER — SODIUM CHLORIDE 0.9 % IV BOLUS
1000.0000 mL | Freq: Once | INTRAVENOUS | Status: AC
  Administered 2022-04-09: 1000 mL via INTRAVENOUS

## 2022-04-09 MED ORDER — ALBUTEROL SULFATE HFA 108 (90 BASE) MCG/ACT IN AERS: 2.0000 | INHALATION_SPRAY | RESPIRATORY_TRACT | 3 refills | Status: DC | PRN

## 2022-04-09 MED ORDER — ACETAMINOPHEN 325 MG PO TABS
650.0000 mg | ORAL_TABLET | Freq: Once | ORAL | Status: AC
  Administered 2022-04-09: 650 mg via ORAL
  Filled 2022-04-09: qty 2

## 2022-04-09 NOTE — ED Provider Notes (Signed)
[facility] EMERGENCY DEPARTMENT Provider Note   CSN: [id number] Arrival date & time: 04/09/22  1124     History  Chief Complaint  Patient presents with   Generalized Body Aches    [name] is a 36 y.o. female.  Is a 36 year old female with bodyaches, cough, runny nose and nausea with vomiting for the past 2 to 3 days.  She states she has an aunt who recently was diagnosed with RSV and influenza.  She denies diarrhea, abdominal pain, urinary symptoms.  No chest pain.  She does have shortness of breath when she coughs.        Home Medications Prior to Admission medications   Medication Sig Start Date End Date Taking? Authorizing Provider  acetaminophen (TYLENOL) 500 MG tablet Take 1,000 mg by mouth every 6 (six) hours as needed for mild pain or moderate pain.    [provider]  diclofenac Sodium (VOLTAREN) 1 % GEL Apply 2 g topically daily as needed (pain).    [provider]  ondansetron (ZOFRAN-ODT) 4 MG disintegrating tablet Take 1 tablet (4 mg total) by mouth every 8 (eight) hours as needed. 12/22/21   Jacalyn Lefevre, MD  oxyCODONE-acetaminophen (PERCOCET/ROXICET) 5-325 MG tablet Take 1 tablet by mouth every 6 (six) hours as needed for severe pain. 12/22/21   Jacalyn Lefevre, MD  pantoprazole (PROTONIX) 40 MG tablet Take 1 tablet by mouth daily. 08/19/17   [provider]  promethazine (PHENERGAN) 25 MG tablet Take 25 mg by mouth every 6 (six) hours as needed for vomiting or nausea. 12/15/21   [provider]  tiZANidine (ZANAFLEX) 4 MG tablet Take 4 mg by mouth daily as needed for muscle spasms. 09/09/21   [provider]      Allergies    Bee venom, Other, Fentanyl, Morphine, Toradol [ketorolac tromethamine], and Reglan [metoclopramide]    Review of Systems   Review of Systems  Constitutional:  Positive for chills.  Respiratory:  Positive for cough.     Physical Exam Updated Vital Signs BP 121/79 (BP Location: Left Arm)    Pulse 80   Temp 100 F (37.8 C) (Oral)   Resp (!) 21   Ht 5' 4.5" (1.638 m)   Wt 54.4 kg   LMP 03/10/2022   SpO2 96%   BMI 20.28 kg/m  Physical Exam Vitals and nursing note reviewed.  Constitutional:      General: She is not in acute distress.    Appearance: She is well-developed.  HENT:     Head: Normocephalic and atraumatic.  Eyes:     Conjunctiva/sclera: Conjunctivae normal.  Cardiovascular:     Rate and Rhythm: Normal rate and regular rhythm.     Heart sounds: No murmur heard. Pulmonary:     Effort: Pulmonary effort is normal. No respiratory distress.     Breath sounds: Normal breath sounds.  Abdominal:     Palpations: Abdomen is soft.     Tenderness: There is no abdominal tenderness.  Musculoskeletal:        General: No swelling. Normal range of motion.     Cervical back: Neck supple.  Skin:    General: Skin is warm and dry.     Capillary Refill: Capillary refill takes less than 2 seconds.  Neurological:     Mental Status: She is alert.  Psychiatric:        Mood and Affect: Mood normal.        Behavior: Behavior normal.     ED  Results / Procedures / Treatments   Labs (all labs ordered are listed, but only abnormal results are displayed) Labs Reviewed  RESP PANEL BY RT-PCR (RSV, FLU A&B, COVID)  RVPGX2 - Abnormal; Notable for the following components:      Result Value   Influenza B by PCR POSITIVE (*)    All other components within normal limits  POC URINE PREG, ED    EKG None  Radiology DG Chest Port 1 View  Result Date: 04/09/2022 CLINICAL DATA:  Cough. EXAM: PORTABLE CHEST 1 VIEW COMPARISON:  Chest radiograph dated March 30, 2021 FINDINGS: The heart size and mediastinal contours are within normal limits. Both lungs are clear. The visualized skeletal structures are unremarkable. IMPRESSION: No active disease. Electronically Signed   By: Larose Hires D.O.   On: 04/09/2022 12:58    Procedures Procedures    Medications Ordered in  ED Medications  ondansetron (ZOFRAN-ODT) disintegrating tablet 4 mg (4 mg Oral Given 04/09/22 1225)  acetaminophen (TYLENOL) tablet 650 mg (650 mg Oral Given 04/09/22 1225)  sodium chloride 0.9 % bolus 1,000 mL (1,000 mLs Intravenous New Bag/Given 04/09/22 1311)    ED Course/ Medical Decision Making/ A&P                           Medical Decision Making This patient presents to the ED for concern of cough, nausea and vomiting, this involves an extensive number of treatment options, and is a complaint that carries with it a high risk of complications and morbidity.  The differential diagnosis includes influenza, COVID-19, pneumonia, dehydration, other      Additional history obtained:  Additional history obtained from [facility] External records from outside source obtained and reviewed including visit for gastritis   Lab Tests:  I Ordered, and personally interpreted labs.  The pertinent results include: COVID and RSV swab, positive for flu B   Imaging Studies ordered:  I ordered imaging studies including chest x-ray I independently visualized and interpreted imaging which showed no infiltrate or pulmonary edema I agree with the radiologist interpretation      Problem List / ED Course / Critical interventions / Medication management  Influenza B-patient is having cough and bodyaches but also having nausea and vomiting, was initially only tolerating ice chips so given IV fluids for rehydration, chest x-ray ordered due to complaint of significant cough with some shortness of breath with this.  Chest x-ray shows no acute abnormalities, patient's feeling somewhat better after fluids, Zofran and Tylenol.  She is not able to take NSAIDs due to her gastritis.  Advised on supportive care at home, follow-up and return precautions. I ordered medication including Zofran for nausea Reevaluation of the patient after these medicines showed that the patient feels better I have reviewed the  patients home medicines and have made adjustments as needed     Test / Admission - Considered:  .  BMP to check electrolytes but patient has no muscle cramping, vitals are reassuring, she is now taking oral fluids, discussed with patient we can hold off on labs now but would want to check them if her symptoms return or persist.  Discussed she can hydrate with electrolyte containing beverages such as Gatorade or Pedialyte    Amount and/or Complexity of Data Reviewed Radiology: ordered.  Risk OTC drugs. Prescription drug management.           Final Clinical Impression(s) / ED Diagnoses Final diagnoses:  Influenza B  Nausea and  vomiting, unspecified vomiting type    Rx / DC Orders ED Discharge Orders     None         Josem Kaufmann 04/09/22 1314    Eber Hong, MD 04/12/22 [id number]

## 2022-04-09 NOTE — ED Triage Notes (Signed)
Pt states x 2-3 days,she has been having chills, body aches. Pt's aunt recently dx with RSV+ and flu

## 2022-04-09 NOTE — ED Notes (Signed)
Pt vomited after taking tylenol. Called out for nurse stating felt like medication was stuck and was having difficulty breathing. 02 sats 97% on RA. Pt having some anxiety, feels more comfortable lying down.

## 2022-04-09 NOTE — Discharge Instructions (Addendum)
Have influenza B.  You need to keep yourself hydrated, use the Zofran as needed for nausea and vomiting, drink plenty of fluids and rest.  Follow-up close with your primary care doctor, come back to the ER if you have trouble breathing, cannot keep fluids down even with the Zofran or other worrisome changes

## 2022-05-14 ENCOUNTER — Emergency Department (HOSPITAL_COMMUNITY)
Admission: EM | Admit: 2022-05-14 | Discharge: 2022-05-14 | Disposition: A | Discharge status: Home / Self Care | Payer: Medicaid Other | Attending: Emergency Medicine | Admitting: Emergency Medicine

## 2022-05-14 DIAGNOSIS — N7689 Other specified inflammation of vagina and vulva: Secondary | ICD-10-CM | POA: Diagnosis present

## 2022-05-14 DIAGNOSIS — N9489 Other specified conditions associated with female genital organs and menstrual cycle: Secondary | ICD-10-CM

## 2022-05-14 MED ORDER — BACITRACIN ZINC 500 UNIT/GM EX OINT
TOPICAL_OINTMENT | Freq: Once | CUTANEOUS | Status: AC
  Administered 2022-05-14: 1 via TOPICAL
  Filled 2022-05-14: qty 0.9

## 2022-05-14 MED ORDER — BACITRACIN ZINC 500 UNIT/GM EX OINT: 1.0000 | TOPICAL_OINTMENT | Freq: Two times a day (BID) | CUTANEOUS | 0 refills | Status: DC

## 2022-05-14 NOTE — Discharge Instructions (Signed)
Be sure to apply warm compresses and you can take baths with soapy water to help with the enlarged area on your labia.  Apply the antibiotic ointment twice per day.  You can apply topical numbing medicine as well (such as lidocaine over the counter).   If the area gets larger, you develop more pain, get a fever, or have any other new/concerning symptoms then return to the ER for evaluation.

## 2022-05-14 NOTE — ED Provider Notes (Signed)
[facility] Provider Note   CSN: [id number] Arrival date & time: 05/14/22  2028     History  Chief Complaint  Patient presents with   Abscess    [name] is a 37 y.o. female.  HPI 37 year old female presents with swelling to her labia.  She reports this has been ongoing for a couple days but dramatically got worse today.  Started off as a small firm swollen area on the right side.  Today she feels like it has quadrupled in size and is painful.  Seems to be getting bigger.  No drainage, fever, vaginal discharge or vaginal pain  Home Medications Prior to Admission medications   Medication Sig Start Date End Date Taking? Authorizing Provider  bacitracin ointment Apply 1 Application topically 2 (two) times daily. 05/14/22  Yes Sherwood Gambler, MD  acetaminophen (TYLENOL) 500 MG tablet Take 1,000 mg by mouth every 6 (six) hours as needed for mild pain or moderate pain.    [provider]  albuterol (VENTOLIN HFA) 108 (90 Base) MCG/ACT inhaler Inhale 2 puffs into the lungs every 4 (four) hours as needed for wheezing or shortness of breath. 04/09/22   Noemi Chapel, MD  diclofenac Sodium (VOLTAREN) 1 % GEL Apply 2 g topically daily as needed (pain).    [provider]  ondansetron (ZOFRAN) 4 MG tablet Take 1 tablet (4 mg total) by mouth every 6 (six) hours. 04/09/22   Sherrye Payor A, PA-C  ondansetron (ZOFRAN-ODT) 4 MG disintegrating tablet Take 1 tablet (4 mg total) by mouth every 8 (eight) hours as needed. 12/22/21   Isla Pence, MD  oxyCODONE-acetaminophen (PERCOCET/ROXICET) 5-325 MG tablet Take 1 tablet by mouth every 6 (six) hours as needed for severe pain. 12/22/21   Isla Pence, MD  pantoprazole (PROTONIX) 40 MG tablet Take 1 tablet by mouth daily. 08/19/17   [provider]  promethazine (PHENERGAN) 25 MG tablet Take 25 mg by mouth every 6 (six) hours as needed for vomiting or nausea. 12/15/21   [provider]  tiZANidine (ZANAFLEX) 4 MG tablet Take 4 mg by mouth daily as needed for muscle spasms. 09/09/21   [provider]      Allergies    Bee venom, Other, Fentanyl, Morphine, Toradol [ketorolac tromethamine], and Reglan [metoclopramide]    Review of Systems   Review of Systems  Constitutional:  Negative for fever.  Genitourinary:  Negative for vaginal discharge.    Physical Exam Updated Vital Signs BP 120/81 (BP Location: Right Arm)   Pulse 97   Temp 98.2 F (36.8 C) (Oral)   Resp 16   Ht 5\' 4"  (1.626 m)   Wt 54.4 kg   LMP 05/09/2022   SpO2 99%   BMI 20.60 kg/m  Physical Exam Vitals and nursing note reviewed. Exam conducted with a chaperone present.  Constitutional:      Appearance: She is well-developed.  HENT:     Head: Normocephalic and atraumatic.  Pulmonary:     Effort: Pulmonary effort is normal.  Abdominal:     General: There is no distension.  Genitourinary:   Skin:    General: Skin is warm and dry.  Neurological:     Mental Status: She is alert.     ED Results / Procedures / Treatments   Labs (all labs ordered are listed, but only abnormal results are displayed) Labs Reviewed - No data to display  EKG None  Radiology No results  found.  Procedures Procedures    Medications Ordered in ED Medications  bacitracin ointment (has no administration in time range)    ED Course/ Medical Decision Making/ A&P                             Medical Decision Making Risk OTC drugs.   Patient has a small but firm circular area.  Unclear if this is truly infectious.  It is quite firm.  Offered to try and incise and drain though she does not want to do this and we decided to do topical antibiotics, warm soaks, and follow-up with GYN.  Can use over-the-counter lidocaine ointment as well.  Otherwise, exam is not consistent with Bartholin's abscess.  Discharged home with return precautions.        Final Clinical Impression(s) /  ED Diagnoses Final diagnoses:  Swelling of labia    Rx / DC Orders ED Discharge Orders          Ordered    bacitracin ointment  2 times daily        05/14/22 2122              Sherwood Gambler, MD 05/14/22 2126

## 2022-05-14 NOTE — ED Triage Notes (Signed)
Pt to ED c/o vaginal abscess x 3 days, progressively getting worse.

## 2022-08-08 ENCOUNTER — Emergency Department (HOSPITAL_COMMUNITY)
Admission: EM | Admit: 2022-08-08 | Discharge: 2022-08-08 | Disposition: A | Discharge status: Home / Self Care | Payer: Medicaid Other | Attending: Student | Admitting: Student

## 2022-08-08 ENCOUNTER — Encounter (HOSPITAL_COMMUNITY): Payer: Self-pay

## 2022-08-08 ENCOUNTER — Other Ambulatory Visit: Payer: Self-pay

## 2022-08-08 DIAGNOSIS — I10 Essential (primary) hypertension: Secondary | ICD-10-CM | POA: Insufficient documentation

## 2022-08-08 DIAGNOSIS — F17210 Nicotine dependence, cigarettes, uncomplicated: Secondary | ICD-10-CM | POA: Diagnosis not present

## 2022-08-08 DIAGNOSIS — R112 Nausea with vomiting, unspecified: Secondary | ICD-10-CM | POA: Diagnosis not present

## 2022-08-08 DIAGNOSIS — R1013 Epigastric pain: Secondary | ICD-10-CM | POA: Insufficient documentation

## 2022-08-08 LAB — COMPREHENSIVE METABOLIC PANEL
ALT: 48 U/L — ABNORMAL HIGH (ref 0–44)
AST: 29 U/L (ref 15–41)
Albumin: 3.9 g/dL (ref 3.5–5.0)
Alkaline Phosphatase: 78 U/L (ref 38–126)
Anion gap: 7 (ref 5–15)
BUN: 15 mg/dL (ref 6–20)
CO2: 25 mmol/L (ref 22–32)
Calcium: 9 mg/dL (ref 8.9–10.3)
Chloride: 105 mmol/L (ref 98–111)
Creatinine, Ser: 0.78 mg/dL (ref 0.44–1.00)
GFR, Estimated: >60 mL/min (ref >60)
Glucose, Bld: 151 mg/dL — ABNORMAL HIGH (ref 70–99)
Potassium: 3.9 mmol/L (ref 3.5–5.1)
Sodium: 137 mmol/L (ref 135–145)
Total Bilirubin: 0.3 mg/dL (ref 0.3–1.2)
Total Protein: 7.1 g/dL (ref 6.5–8.1)

## 2022-08-08 LAB — URINALYSIS, ROUTINE W REFLEX MICROSCOPIC
Bacteria, UA: NONE SEEN
Bilirubin Urine: NEGATIVE
Glucose, UA: NEGATIVE mg/dL
Hgb urine dipstick: NEGATIVE
Ketones, ur: NEGATIVE mg/dL
Nitrite: NEGATIVE
Protein, ur: NEGATIVE mg/dL
Specific Gravity, Urine: 1.023 (ref 1.005–1.030)
pH: 5 (ref 5.0–8.0)

## 2022-08-08 LAB — CBC
HCT: 42.4 % (ref 36.0–46.0)
Hemoglobin: 14 g/dL (ref 12.0–15.0)
MCH: 31.2 pg (ref 26.0–34.0)
MCHC: 33 g/dL (ref 30.0–36.0)
MCV: 94.4 fL (ref 80.0–100.0)
Platelets: 237 10*3/uL (ref 150–400)
RBC: 4.49 MIL/uL (ref 3.87–5.11)
RDW: 13.6 % (ref 11.5–15.5)
WBC: 10.5 10*3/uL (ref 4.0–10.5)
nRBC: 0 % (ref 0.0–0.2)

## 2022-08-08 LAB — POC URINE PREG, ED: Preg Test, Ur: NEGATIVE

## 2022-08-08 LAB — LIPASE, BLOOD: Lipase: 42 U/L (ref 11–51)

## 2022-08-08 MED ORDER — LORAZEPAM 2 MG/ML IJ SOLN
0.5000 mg | Freq: Once | INTRAMUSCULAR | Status: AC
  Administered 2022-08-08: 0.5 mg via INTRAVENOUS
  Filled 2022-08-08: qty 1

## 2022-08-08 MED ORDER — LACTATED RINGERS IV BOLUS
1000.0000 mL | Freq: Once | INTRAVENOUS | Status: AC
  Administered 2022-08-08: 1000 mL via INTRAVENOUS

## 2022-08-08 MED ORDER — DICYCLOMINE HCL 20 MG PO TABS: 20.0000 mg | ORAL_TABLET | Freq: Two times a day (BID) | ORAL | 0 refills | Status: DC | PRN

## 2022-08-08 MED ORDER — DICYCLOMINE HCL 10 MG PO CAPS
20.0000 mg | ORAL_CAPSULE | Freq: Once | ORAL | Status: AC
  Administered 2022-08-08: 20 mg via ORAL
  Filled 2022-08-08: qty 2

## 2022-08-08 MED ORDER — DROPERIDOL 2.5 MG/ML IJ SOLN
1.2500 mg | Freq: Once | INTRAMUSCULAR | Status: AC
  Administered 2022-08-08: 1.25 mg via INTRAVENOUS
  Filled 2022-08-08: qty 2

## 2022-08-08 NOTE — ED Triage Notes (Signed)
Pt presents with upper abd pain with N/V. Pt reports a hx of chronic pancreatitis and pt feels like symptoms are similar to previous episodes.
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
## 2022-08-08 NOTE — ED Notes (Signed)
Pt placed on 5 lead cardiac BP cuff and O2 sensor
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
## 2022-08-16 ENCOUNTER — Encounter: Payer: Self-pay | Admitting: Gastroenterology

## 2022-08-16 ENCOUNTER — Other Ambulatory Visit: Payer: Self-pay | Admitting: *Deleted

## 2022-08-16 ENCOUNTER — Ambulatory Visit: Payer: Medicaid Other | Admitting: Gastroenterology

## 2022-08-16 ENCOUNTER — Encounter: Payer: Self-pay | Admitting: *Deleted

## 2022-08-16 VITALS — BP 117/69 | HR 101 | Temp 98.2°F | Ht 64.0 in | Wt 122.2 lb

## 2022-08-16 DIAGNOSIS — Z8619 Personal history of other infectious and parasitic diseases: Secondary | ICD-10-CM

## 2022-08-16 DIAGNOSIS — R195 Other fecal abnormalities: Secondary | ICD-10-CM | POA: Diagnosis not present

## 2022-08-16 DIAGNOSIS — R7401 Elevation of levels of liver transaminase levels: Secondary | ICD-10-CM

## 2022-08-16 DIAGNOSIS — R1013 Epigastric pain: Secondary | ICD-10-CM

## 2022-08-16 DIAGNOSIS — K861 Other chronic pancreatitis: Secondary | ICD-10-CM | POA: Diagnosis not present

## 2022-08-16 MED ORDER — PANCRELIPASE (LIP-PROT-AMYL) 36000-114000 UNITS PO CPEP
ORAL_CAPSULE | ORAL | 5 refills | Status: DC
Start: 2022-08-16 — End: 2023-10-27

## 2022-08-16 NOTE — Patient Instructions (Signed)
We will arrange you to have an upper endoscopy in the near future with Dr. Jena Gauss.  We will arrange for you to have an ultrasound of your liver/gallbladder at [facility].  Start Creon 2 capsules with meals 3 times daily and 1 capsule with snacks up to 2 snacks daily.  Continue pantoprazole 40 mg daily.  Continue to use Phenergan or Zofran as needed.  Continue to advance your diet slowly.  Avoid fried, fatty, greasy foods.  We will follow-up with you in the office after your procedure.  Do not hesitate to call sooner if you have questions or concerns.  Ermalinda Memos, PA-C [facility]

## 2022-08-17 ENCOUNTER — Encounter: Payer: Self-pay | Admitting: *Deleted

## 2022-08-17 ENCOUNTER — Telehealth: Payer: Self-pay | Admitting: *Deleted

## 2022-08-17 NOTE — Telephone Encounter (Signed)
Spoke to pt about scheduling conflict. She was ok to have procedure time moved to 1 pm with arrival time of 11:30 am.

## 2022-08-18 ENCOUNTER — Encounter: Payer: Self-pay | Admitting: Gastroenterology

## 2022-08-23 ENCOUNTER — Encounter (HOSPITAL_COMMUNITY)
Admission: RE | Admit: 2022-08-23 | Discharge: 2022-08-23 | Disposition: A | Discharge status: Home / Self Care | Payer: Medicaid Other | Source: Ambulatory Visit | Attending: Internal Medicine | Admitting: Internal Medicine

## 2022-08-23 DIAGNOSIS — Z01818 Encounter for other preprocedural examination: Secondary | ICD-10-CM

## 2022-08-25 ENCOUNTER — Ambulatory Visit (HOSPITAL_BASED_OUTPATIENT_CLINIC_OR_DEPARTMENT_OTHER): Payer: Medicaid Other | Admitting: Certified Registered"

## 2022-08-25 ENCOUNTER — Ambulatory Visit (HOSPITAL_COMMUNITY)
Admission: RE | Admit: 2022-08-25 | Discharge: 2022-08-25 | Disposition: A | Discharge status: Home / Self Care | Payer: Medicaid Other | Attending: Internal Medicine | Admitting: Internal Medicine

## 2022-08-25 ENCOUNTER — Encounter (HOSPITAL_COMMUNITY)
Admission: RE | Disposition: A | Discharge status: Home / Self Care | Payer: Self-pay | Source: Home / Self Care | Attending: Internal Medicine

## 2022-08-25 ENCOUNTER — Ambulatory Visit (HOSPITAL_COMMUNITY): Payer: Medicaid Other | Admitting: Certified Registered"

## 2022-08-25 ENCOUNTER — Encounter (HOSPITAL_COMMUNITY): Payer: Self-pay | Admitting: Internal Medicine

## 2022-08-25 DIAGNOSIS — I10 Essential (primary) hypertension: Secondary | ICD-10-CM | POA: Insufficient documentation

## 2022-08-25 DIAGNOSIS — R7401 Elevation of levels of liver transaminase levels: Secondary | ICD-10-CM | POA: Diagnosis not present

## 2022-08-25 DIAGNOSIS — R112 Nausea with vomiting, unspecified: Secondary | ICD-10-CM | POA: Diagnosis not present

## 2022-08-25 DIAGNOSIS — R197 Diarrhea, unspecified: Secondary | ICD-10-CM | POA: Insufficient documentation

## 2022-08-25 DIAGNOSIS — K861 Other chronic pancreatitis: Secondary | ICD-10-CM | POA: Diagnosis not present

## 2022-08-25 DIAGNOSIS — K219 Gastro-esophageal reflux disease without esophagitis: Secondary | ICD-10-CM | POA: Insufficient documentation

## 2022-08-25 DIAGNOSIS — F17210 Nicotine dependence, cigarettes, uncomplicated: Secondary | ICD-10-CM | POA: Diagnosis not present

## 2022-08-25 DIAGNOSIS — J189 Pneumonia, unspecified organism: Secondary | ICD-10-CM | POA: Diagnosis not present

## 2022-08-25 DIAGNOSIS — Z79899 Other long term (current) drug therapy: Secondary | ICD-10-CM | POA: Diagnosis not present

## 2022-08-25 DIAGNOSIS — K2950 Unspecified chronic gastritis without bleeding: Secondary | ICD-10-CM | POA: Diagnosis not present

## 2022-08-25 DIAGNOSIS — Z01818 Encounter for other preprocedural examination: Secondary | ICD-10-CM

## 2022-08-25 DIAGNOSIS — B9681 Helicobacter pylori [H. pylori] as the cause of diseases classified elsewhere: Secondary | ICD-10-CM

## 2022-08-25 DIAGNOSIS — R1013 Epigastric pain: Secondary | ICD-10-CM | POA: Diagnosis present

## 2022-08-25 DIAGNOSIS — K3189 Other diseases of stomach and duodenum: Secondary | ICD-10-CM

## 2022-08-25 HISTORY — PX: BIOPSY: SHX5522

## 2022-08-25 HISTORY — PX: ESOPHAGOGASTRODUODENOSCOPY (EGD) WITH PROPOFOL: SHX5813

## 2022-08-25 LAB — POCT PREGNANCY, URINE: Preg Test, Ur: NEGATIVE

## 2022-08-25 SURGERY — ESOPHAGOGASTRODUODENOSCOPY (EGD) WITH PROPOFOL
Anesthesia: General

## 2022-08-25 MED ORDER — PROPOFOL 500 MG/50ML IV EMUL
INTRAVENOUS | Status: DC | PRN
  Administered 2022-08-25: 200 ug/kg/min via INTRAVENOUS

## 2022-08-25 MED ORDER — LACTATED RINGERS IV SOLN: INTRAVENOUS | Status: DC

## 2022-08-25 MED ORDER — MIDAZOLAM HCL 2 MG/2ML IJ SOLN
INTRAMUSCULAR | Status: AC
  Filled 2022-08-25: qty 2

## 2022-08-25 MED ORDER — MIDAZOLAM HCL 2 MG/2ML IJ SOLN
INTRAMUSCULAR | Status: AC
  Administered 2022-08-25: 2 mg via INTRAVENOUS
  Filled 2022-08-25: qty 2

## 2022-08-25 MED ORDER — MIDAZOLAM HCL 2 MG/2ML IJ SOLN: 2.0000 mg | Freq: Once | INTRAMUSCULAR | Status: AC

## 2022-08-25 MED ORDER — LIDOCAINE HCL (CARDIAC) PF 100 MG/5ML IV SOSY
PREFILLED_SYRINGE | INTRAVENOUS | Status: DC | PRN
  Administered 2022-08-25: 50 mg via INTRAVENOUS

## 2022-08-25 MED ORDER — PROPOFOL 10 MG/ML IV BOLUS
INTRAVENOUS | Status: DC | PRN
  Administered 2022-08-25: 150 mg via INTRAVENOUS

## 2022-08-25 MED ORDER — STERILE WATER FOR IRRIGATION IR SOLN
Status: DC | PRN
  Administered 2022-08-25: .6 mL

## 2022-08-25 MED ORDER — LACTATED RINGERS IV SOLN
INTRAVENOUS | Status: DC | PRN
  Administered 2022-08-25: 1000 mL via INTRAVENOUS

## 2022-08-25 MED ORDER — DEXMEDETOMIDINE HCL IN NACL 80 MCG/20ML IV SOLN
INTRAVENOUS | Status: DC | PRN
  Administered 2022-08-25: 8 ug via INTRAVENOUS

## 2022-08-25 NOTE — Interval H&P Note (Signed)
History and Physical Interval Note:  08/25/2022 12:45 PM  [name]  has presented today for surgery, with the diagnosis of history of H.pylori, epigastric pain,.  The various methods of treatment have been discussed with the patient and family. After consideration of risks, benefits and other options for treatment, the patient has consented to  Procedure(s) with comments: ESOPHAGOGASTRODUODENOSCOPY (EGD) WITH PROPOFOL (N/A) - 1:45 pm, asa 2 as a surgical intervention.  The patient's history has been reviewed, patient examined, no change in status, stable for surgery.  I have reviewed the patient's chart and labs.  Questions were answered to the patient's satisfaction.     [name]    No change no dysphagia.  Diagnostic EGD per plan.  The risks, benefits, limitations, alternatives and imponderables have been reviewed with the patient. Potential for esophageal dilation, biopsy, etc. have also been reviewed.  Questions have been answered. All parties agreeable.

## 2022-08-25 NOTE — Op Note (Signed)
[facility] Patient Name: [name] Procedure Date: 08/25/2022 11:53 AM MRN: [id number] Date of Birth: [birth year] Attending MD: Gennette Pac , MD, [id number] CSN: [id number] Age: 37 Admit Type: Outpatient Procedure:                Upper GI endoscopy Indications:              Epigastric abdominal pain Providers:                Gennette Pac, MD, Jannett Celestine, RN, Dyann Ruddle Referring MD:              Medicines:                Propofol per Anesthesia Complications:            No immediate complications. Estimated Blood Loss:     Estimated blood loss was minimal. Estimated blood                            loss was minimal. Procedure:                Pre-Anesthesia Assessment:                           - Prior to the procedure, a History and Physical                            was performed, and patient medications and                            allergies were reviewed. The patient's tolerance of                            previous anesthesia was also reviewed. The risks                            and benefits of the procedure and the sedation                            options and risks were discussed with the patient.                            All questions were answered, and informed consent                            was obtained. Prior Anticoagulants: The patient has                            taken no anticoagulant or antiplatelet agents. ASA                            Grade Assessment: III - A patient with severe  systemic disease. After reviewing the risks and                            benefits, the patient was deemed in satisfactory                            condition to undergo the procedure.                           After obtaining informed consent, the endoscope was                            passed under direct vision. Throughout the                            procedure, the patient's blood  pressure, pulse, and                            oxygen saturations were monitored continuously. The                            [id number]-[id number] ([id number]) scope was introduced through the                            mouth, and advanced to the second part of duodenum.                            The upper GI endoscopy was accomplished without                            difficulty. The patient tolerated the procedure                            well. The upper GI endoscopy was accomplished                            without difficulty. The patient tolerated the                            procedure well. Scope In: 12:51:02 PM Scope Out: 12:55:41 PM Total Procedure Duration: 0 hours 4 minutes 39 seconds  Findings:      The examined esophagus was normal.      Diffuse mild snake skinning or fish scale appearance of the gastric       mucosa. No ulcer or infiltrating process observed. Pylorus patent.      The duodenal bulb and second portion of the duodenum were normal.       Gastric mucosa biopsied. Impression:               - Normal esophagus. Abnormal gastric mucosa of                            uncertain significance. Status post gastric biopsy.                           -  Normal duodenal bulb and second portion of the                            duodenum.                           - No specimens collected. Moderate Sedation:      Moderate (conscious) sedation was personally administered by an       anesthesia professional. The following parameters were monitored: oxygen       saturation, heart rate, blood pressure, respiratory rate, EKG, adequacy       of pulmonary ventilation, and response to care. Recommendation:           - Patient has a contact number available for                            emergencies. The signs and symptoms of potential                            delayed complications were discussed with the                            patient. Return to normal activities tomorrow.                             Written discharge instructions were provided to the                            patient.                           - Advance diet as tolerated.                           - Continue present medications. Follow-up on                            pathology. Begin pancreatic enzyme supplements and                            continue Protonix as soon soon as possible along                            with continuing Protonix.                           - Return to my office in 3 weeks. Procedure Code(s):        --- Professional ---                           [phone], Esophagogastroduodenoscopy, flexible,                            transoral; diagnostic, including collection of  specimen(s) by brushing or washing, when performed                            (separate procedure) Diagnosis Code(s):        --- Professional ---                           R10.13, Epigastric pain CPT copyright 2022 American Medical Association. All rights reserved. The codes documented in this report are preliminary and upon coder review may  be revised to meet current compliance requirements. Gerrit Friends. Sammi Stolarz, MD Gennette Pac, MD 08/25/2022 2:03:51 PM This report has been signed electronically. Number of Addenda: 0

## 2022-08-25 NOTE — Anesthesia Preprocedure Evaluation (Signed)
Anesthesia Evaluation  Patient identified by MRN, date of birth, ID band Patient awake    Reviewed: Allergy & Precautions, H&P , NPO status , Patient's Chart, lab work & pertinent test results, reviewed documented beta blocker date and time   Airway Mallampati: II  TM Distance: >3 FB Neck ROM: full    Dental no notable dental hx.    Pulmonary neg pulmonary ROS, pneumonia, Current Smoker   Pulmonary exam normal breath sounds clear to auscultation       Cardiovascular Exercise Tolerance: Good hypertension, negative cardio ROS  Rhythm:regular Rate:Normal     Neuro/Psych  Headaches PSYCHIATRIC DISORDERS Anxiety Depression    negative neurological ROS  negative psych ROS   GI/Hepatic negative GI ROS, Neg liver ROS,GERD  ,,  Endo/Other  negative endocrine ROS    Renal/GU negative Renal ROS  negative genitourinary   Musculoskeletal   Abdominal   Peds  Hematology negative hematology ROS (+) Blood dyscrasia, anemia   Anesthesia Other Findings   Reproductive/Obstetrics negative OB ROS                             Anesthesia Physical Anesthesia Plan  ASA: 2  Anesthesia Plan: General   Post-op Pain Management:    Induction:   PONV Risk Score and Plan: Propofol infusion  Airway Management Planned:   Additional Equipment:   Intra-op Plan:   Post-operative Plan:   Informed Consent: I have reviewed the patients History and Physical, chart, labs and discussed the procedure including the risks, benefits and alternatives for the proposed anesthesia with the patient or authorized representative who has indicated his/her understanding and acceptance.     Dental Advisory Given  Plan Discussed with: CRNA  Anesthesia Plan Comments:        Anesthesia Quick Evaluation

## 2022-08-25 NOTE — Discharge Instructions (Signed)
EGD Discharge instructions Please read the instructions outlined below and refer to this sheet in the next few weeks. These discharge instructions provide you with general information on caring for yourself after you leave the hospital. Your doctor may also give you specific instructions. While your treatment has been planned according to the most current medical practices available, unavoidable complications occasionally occur. If you have any problems or questions after discharge, please call your doctor. ACTIVITY You may resume your regular activity but move at a slower pace for the next 24 hours.  Take frequent rest periods for the next 24 hours.  Walking will help expel (get rid of) the air and reduce the bloated feeling in your abdomen.  No driving for 24 hours (because of the anesthesia (medicine) used during the test).  You may shower.  Do not sign any important legal documents or operate any machinery for 24 hours (because of the anesthesia used during the test).  NUTRITION Drink plenty of fluids.  You may resume your normal diet.  Begin with a light meal and progress to your normal diet.  Avoid alcoholic beverages for 24 hours or as instructed by your caregiver.  MEDICATIONS You may resume your normal medications unless your caregiver tells you otherwise.  WHAT YOU CAN EXPECT TODAY You may experience abdominal discomfort such as a feeling of fullness or "gas" pains.  FOLLOW-UP Your doctor will discuss the results of your test with you.  SEEK IMMEDIATE MEDICAL ATTENTION IF ANY OF THE FOLLOWING OCCUR: Excessive nausea (feeling sick to your stomach) and/or vomiting.  Severe abdominal pain and distention (swelling).  Trouble swallowing.  Temperature over 101 F (37.8 C).  Rectal bleeding or vomiting of blood.      Your esophagus appeared normal.  Your stomach looked a little inflamed; otherwise normal.  Biopsies of your stomach taken per plan.      Continue pantoprazole 40 mg  daily best taken 30 minutes before breakfast.  Begin your pancreatic enzymes as soon as possible as recommended   further recommendations to follow pending review of pathology report  At patient request, I called Christiane Ha at [phone].   Rolled to voicemail.  Left a message.

## 2022-08-25 NOTE — Transfer of Care (Addendum)
Immediate Anesthesia Transfer of Care Note  Patient: [name]  Procedure(s) Performed: ESOPHAGOGASTRODUODENOSCOPY (EGD) WITH PROPOFOL BIOPSY  Patient Location: Short Stay  Anesthesia Type:General  Level of Consciousness: drowsy  Airway & Oxygen Therapy: Patient Spontanous Breathing and Patient connected to nasal cannula oxygen  Post-op Assessment: Report given to RN and Post -op Vital signs reviewed and stable  Post vital signs: Reviewed and stable  Last Vitals:  Vitals Value Taken Time  BP    Temp    Pulse    Resp    SpO2      Last Pain:  Vitals:   08/25/22 1106  TempSrc: Oral  PainSc: 4       Patients Stated Pain Goal: 4 (08/25/22 1106)  Complications: No notable events documented.

## 2022-08-25 NOTE — Interval H&P Note (Signed)
History and Physical Interval Note:  08/25/2022 12:44 PM  [name]  has presented today for surgery, with the diagnosis of history of H.pylori, epigastric pain,.  The various methods of treatment have been discussed with the patient and family. After consideration of risks, benefits and other options for treatment, the patient has consented to  Procedure(s) with comments: ESOPHAGOGASTRODUODENOSCOPY (EGD) WITH PROPOFOL (N/A) - 1:45 pm, asa 2 as a surgical intervention.  The patient's history has been reviewed, patient examined, no change in status, stable for surgery.  I have reviewed the patient's chart and labs.  Questions were answered to the patient's satisfaction.     [name]    No change.  Denies dysphagia.  Just got her prescription for Creon and will be starting it this afternoon.  Diagnostic EGD today The risks, benefits, limitations, alternatives and imponderables have been reviewed with the patient. Potential for esophageal dilation, biopsy, etc. have also been reviewed.  Questions have been answered. All parties agreeable.

## 2022-08-25 NOTE — Anesthesia Procedure Notes (Signed)
Date/Time: 08/25/2022 12:51 PM  Performed by: Julian Reil, CRNAPre-anesthesia Checklist: Patient identified, Emergency Drugs available, Suction available and Patient being monitored Patient Re-evaluated:Patient Re-evaluated prior to induction Oxygen Delivery Method: Nasal cannula Induction Type: IV induction Placement Confirmation: positive ETCO2

## 2022-08-27 LAB — SURGICAL PATHOLOGY

## 2022-08-27 NOTE — Anesthesia Postprocedure Evaluation (Signed)
Anesthesia Post Note  Patient: [name]  Procedure(s) Performed: ESOPHAGOGASTRODUODENOSCOPY (EGD) WITH PROPOFOL BIOPSY  Patient location during evaluation: Phase II Anesthesia Type: General Level of consciousness: awake Pain management: pain level controlled Vital Signs Assessment: post-procedure vital signs reviewed and stable Respiratory status: spontaneous breathing and respiratory function stable Cardiovascular status: blood pressure returned to baseline and stable Postop Assessment: no headache and no apparent nausea or vomiting Anesthetic complications: no Comments: Late entry   No notable events documented.   Last Vitals:  Vitals:   08/25/22 1303 08/25/22 1308  BP: (!) 88/40 106/72  Pulse: 85   Resp: 20   Temp: 36.6 C   SpO2: 98%     Last Pain:  Vitals:   08/25/22 1303  TempSrc: Axillary  PainSc: Asleep                 Windell Norfolk

## 2022-08-30 ENCOUNTER — Encounter: Payer: Self-pay | Admitting: Internal Medicine

## 2022-08-31 ENCOUNTER — Encounter (HOSPITAL_COMMUNITY): Payer: Self-pay | Admitting: Internal Medicine

## 2022-09-01 ENCOUNTER — Ambulatory Visit (HOSPITAL_COMMUNITY): Payer: Medicaid Other | Attending: Gastroenterology

## 2022-10-05 ENCOUNTER — Ambulatory Visit: Payer: Medicaid Other | Attending: Internal Medicine

## 2022-10-05 ENCOUNTER — Telehealth: Payer: Self-pay | Admitting: Internal Medicine

## 2022-10-05 ENCOUNTER — Ambulatory Visit: Payer: Medicaid Other | Attending: Internal Medicine | Admitting: Internal Medicine

## 2022-10-05 ENCOUNTER — Encounter: Payer: Self-pay | Admitting: Internal Medicine

## 2022-10-05 ENCOUNTER — Other Ambulatory Visit: Payer: Self-pay | Admitting: Internal Medicine

## 2022-10-05 VITALS — BP 126/80 | HR 99 | Ht 65.0 in | Wt 130.8 lb

## 2022-10-05 DIAGNOSIS — G90A Postural orthostatic tachycardia syndrome (POTS): Secondary | ICD-10-CM | POA: Insufficient documentation

## 2022-10-05 DIAGNOSIS — I10 Essential (primary) hypertension: Secondary | ICD-10-CM | POA: Diagnosis not present

## 2022-10-05 DIAGNOSIS — R002 Palpitations: Secondary | ICD-10-CM

## 2022-10-05 DIAGNOSIS — R000 Tachycardia, unspecified: Secondary | ICD-10-CM | POA: Diagnosis not present

## 2022-10-05 MED ORDER — METOPROLOL TARTRATE 25 MG PO TABS: 25.0000 mg | ORAL_TABLET | Freq: Two times a day (BID) | ORAL | 1 refills | Status: DC

## 2022-10-05 NOTE — Telephone Encounter (Signed)
[facility]io XT 7 days Dx: Palpitaitons   PERCERT

## 2022-10-05 NOTE — Addendum Note (Signed)
Addended by: Herbert Deaner on: 10/05/2022 10:47 AM   Modules accepted: Level of Service
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
## 2022-10-05 NOTE — Patient Instructions (Addendum)
Medication Instructions:  Your physician has recommended you make the following change in your medication:  Start taking Metoprolol tartrate 25 mg twice daily   Labwork: None  Testing/Procedures: Your physician has recommended that you wear a Zio monitor.   This monitor is a medical device that records the heart's electrical activity. Doctors most often use these monitors to diagnose arrhythmias. Arrhythmias are problems with the speed or rhythm of the heartbeat. The monitor is a small device applied to your chest. You can wear one while you do your normal daily activities. While wearing this monitor if you have any symptoms to push the button and record what you felt. Once you have worn this monitor for the period of time provider prescribed (for 7 days), you will return the monitor device in the postage paid box. Once it is returned they will download the data collected and provide [place] with a report which the provider will then review and we will call you with those results. Important tips:  Avoid showering during the first 48 hours of wearing the monitor. Avoid excessive sweating to help maximize wear time. Do not submerge the device, no hot tubs, and no swimming pools. Keep any lotions or oils away from the patch. After 48 hours you may shower with the patch on. Take brief showers with your back facing the shower head.  Do not remove patch once it has been placed because that will interrupt data and decrease adhesive wear time. Push the button when you have any symptoms and write down what you were feeling. Once you have completed wearing your monitor, remove and place into box which has postage paid and place in your outgoing mailbox.  If for some reason you have misplaced your box then call our office and we can provide another box and/or mail it off for you.   Follow-Up: Your physician recommends that you schedule a follow-up appointment in:   Any Other Special Instructions Will Be  Listed Below (If Applicable).  If you need a refill on your cardiac medications before your next appointment, please call your pharmacy.

## 2022-10-17 NOTE — Progress Notes (Deleted)
   [name], female    DOB: [birth year]    MRN: [id number]   Brief patient profile:  37  yo*** *** referred to pulmonary clinic in [name]  10/18/2022 by *** for ***      History of Present Illness  10/18/2022  Pulmonary/ 1st office eval/ [name] / [name]  No chief complaint on file.    Dyspnea:  *** Cough: *** Sleep: *** SABA use: *** 02: *** Lung cancer screen: ***   Outpatient Medications Prior to Visit  Medication Sig Dispense Refill   acetaminophen (TYLENOL) 500 MG tablet Take 1,000 mg by mouth every 6 (six) hours as needed for mild pain or moderate pain.     albuterol (VENTOLIN HFA) 108 (90 Base) MCG/ACT inhaler Inhale 2 puffs into the lungs every 4 (four) hours as needed for wheezing or shortness of breath. (Patient taking differently: Inhale 2 puffs into the lungs every 6 (six) hours as needed for wheezing or shortness of breath.) 1 each 3   amoxicillin (AMOXIL) 500 MG capsule Take 500 mg by mouth 3 (three) times daily.     diclofenac Sodium (VOLTAREN) 1 % GEL Apply 2 g topically daily as needed (pain).     lipase/protease/amylase (CREON) 36000 UNITS CPEP capsule Take 2 capsules (72,000 Units total) by mouth 3 (three) times daily with meals AND 1 capsule (36,000 Units total) with snacks. 240 capsule 5   metoprolol tartrate (LOPRESSOR) 25 MG tablet Take 1 tablet (25 mg total) by mouth 2 (two) times daily. 180 tablet 1   oxyCODONE-acetaminophen (PERCOCET/ROXICET) 5-325 MG tablet Take 1 tablet by mouth every 6 (six) hours as needed for severe pain. 10 tablet 0   pantoprazole (PROTONIX) 40 MG tablet Take 40 mg by mouth every evening.     promethazine (PHENERGAN) 25 MG tablet Take 25 mg by mouth every 4 (four) hours as needed for vomiting or nausea.     No facility-administered medications prior to visit.    Past Medical History:  Diagnosis Date   Abdominal wall pain    chronic; per [facility] records 07/2012   Anemia    Anxiety    Chronic abdominal pain     Chronic pancreatitis (HCC)    Depression    Gastritis    GERD (gastroesophageal reflux disease)    Headache    migraines   Hemorrhage in pregnancy    History of preterm delivery, currently pregnant in first trimester 05/05/2015   HPV in female    Hypertension    mainly during pregnancy   Nausea & vomiting 05/05/2015   Opiate dependence (HCC) 02/27/2012   Osteomyelitis of leg (HCC)    right tibia, 2009   Pancreatitis    Pancreatitis    Pneumonia    Tobacco abuse    Vaginal Pap smear, abnormal       Objective:     There were no vitals taken for this visit.         Assessment   No problem-specific Assessment & Plan notes found for this encounter.     Amber Hughs, MD 10/17/2022

## 2022-10-18 ENCOUNTER — Institutional Professional Consult (permissible substitution): Payer: Medicaid Other | Admitting: Internal Medicine

## 2022-10-18 ENCOUNTER — Encounter: Payer: Self-pay | Admitting: Internal Medicine

## 2022-11-19 ENCOUNTER — Telehealth: Payer: Self-pay

## 2022-11-19 MED ORDER — PROPRANOLOL HCL 10 MG PO TABS: 10.0000 mg | ORAL_TABLET | Freq: Three times a day (TID) | ORAL | 3 refills | Status: DC

## 2022-11-19 NOTE — Telephone Encounter (Signed)
Patient notified and verbalized understanding. Patient had no questions or concerns at this time. PCP copied 

## 2022-11-19 NOTE — Telephone Encounter (Signed)
-----   Message from [name] sent at 11/19/2022  8:10 AM EDT ----- Normal event monitor. Patient's symptoms correlated with normal sinus rhythm and sinus tachycardia.  Can start propranolol 10 mg TID for symptomatic relief.

## 2022-12-31 ENCOUNTER — Encounter: Payer: Self-pay | Admitting: Internal Medicine

## 2023-01-11 ENCOUNTER — Other Ambulatory Visit: Payer: Self-pay | Admitting: Internal Medicine

## 2023-01-11 NOTE — Telephone Encounter (Signed)
This prescription request was sent to me to refill phenergan.  I do not see where this pt has been seen here and I do not recognize the PCP listed for this pt.  Please notify pharmacy.

## 2023-01-12 ENCOUNTER — Emergency Department (HOSPITAL_COMMUNITY)
Admission: EM | Admit: 2023-01-12 | Discharge: 2023-01-12 | Discharge status: Against Medical Advice | Payer: Medicaid Other

## 2023-02-14 ENCOUNTER — Emergency Department (HOSPITAL_COMMUNITY)
Admission: EM | Admit: 2023-02-14 | Discharge: 2023-02-15 | Disposition: A | Discharge status: Home / Self Care | Payer: Medicaid Other | Attending: Emergency Medicine | Admitting: Emergency Medicine

## 2023-02-14 ENCOUNTER — Encounter (HOSPITAL_COMMUNITY): Payer: Self-pay

## 2023-02-14 ENCOUNTER — Other Ambulatory Visit: Payer: Self-pay

## 2023-02-14 DIAGNOSIS — K625 Hemorrhage of anus and rectum: Secondary | ICD-10-CM | POA: Insufficient documentation

## 2023-02-14 DIAGNOSIS — F17210 Nicotine dependence, cigarettes, uncomplicated: Secondary | ICD-10-CM | POA: Insufficient documentation

## 2023-02-14 DIAGNOSIS — R079 Chest pain, unspecified: Secondary | ICD-10-CM | POA: Insufficient documentation

## 2023-02-14 DIAGNOSIS — G8929 Other chronic pain: Secondary | ICD-10-CM | POA: Insufficient documentation

## 2023-02-14 DIAGNOSIS — I10 Essential (primary) hypertension: Secondary | ICD-10-CM | POA: Diagnosis not present

## 2023-02-14 DIAGNOSIS — R1013 Epigastric pain: Secondary | ICD-10-CM | POA: Diagnosis present

## 2023-02-14 LAB — CBC
HCT: 39 % (ref 36.0–46.0)
Hemoglobin: 13.1 g/dL (ref 12.0–15.0)
MCH: 30.4 pg (ref 26.0–34.0)
MCHC: 33.6 g/dL (ref 30.0–36.0)
MCV: 90.5 fL (ref 80.0–100.0)
Platelets: 367 10*3/uL (ref 150–400)
RBC: 4.31 MIL/uL (ref 3.87–5.11)
RDW: 14.2 % (ref 11.5–15.5)
WBC: 9.6 10*3/uL (ref 4.0–10.5)
nRBC: 0 % (ref 0.0–0.2)

## 2023-02-14 LAB — COMPREHENSIVE METABOLIC PANEL
ALT: 21 U/L (ref 0–44)
AST: 20 U/L (ref 15–41)
Albumin: 4.3 g/dL (ref 3.5–5.0)
Alkaline Phosphatase: 86 U/L (ref 38–126)
Anion gap: 9 (ref 5–15)
BUN: 6 mg/dL (ref 6–20)
CO2: 25 mmol/L (ref 22–32)
Calcium: 9.4 mg/dL (ref 8.9–10.3)
Chloride: 103 mmol/L (ref 98–111)
Creatinine, Ser: 0.75 mg/dL (ref 0.44–1.00)
GFR, Estimated: >60 mL/min (ref >60)
Glucose, Bld: 153 mg/dL — ABNORMAL HIGH (ref 70–99)
Potassium: 3.8 mmol/L (ref 3.5–5.1)
Sodium: 137 mmol/L (ref 135–145)
Total Bilirubin: 0.3 mg/dL (ref 0.3–1.2)
Total Protein: 7.7 g/dL (ref 6.5–8.1)

## 2023-02-14 LAB — LIPASE, BLOOD: Lipase: 22 U/L (ref 11–51)

## 2023-02-14 LAB — URINALYSIS, ROUTINE W REFLEX MICROSCOPIC
Bilirubin Urine: NEGATIVE
Glucose, UA: 50 mg/dL — AB
Hgb urine dipstick: NEGATIVE
Ketones, ur: NEGATIVE mg/dL
Leukocytes,Ua: NEGATIVE
Nitrite: NEGATIVE
Protein, ur: NEGATIVE mg/dL
Specific Gravity, Urine: 1.004 — ABNORMAL LOW (ref 1.005–1.030)
pH: 6 (ref 5.0–8.0)

## 2023-02-14 NOTE — ED Triage Notes (Addendum)
C/o upper abd pain radiating through to back with n/v x5 days.  Also c/o bright red rectal bleeding x3 episodes.  Pt does have hemorrhoids.  Hx pancreatitis.  Denies blood thinner usage.

## 2023-02-15 ENCOUNTER — Emergency Department (HOSPITAL_COMMUNITY): Payer: Medicaid Other

## 2023-02-15 MED ORDER — SUCRALFATE 1 G PO TABS: 1.0000 g | ORAL_TABLET | Freq: Four times a day (QID) | ORAL | 0 refills | Status: DC | PRN

## 2023-02-15 MED ORDER — HYDROMORPHONE HCL 1 MG/ML IJ SOLN
2.0000 mg | Freq: Once | INTRAMUSCULAR | Status: AC
  Administered 2023-02-15: 2 mg via INTRAMUSCULAR
  Filled 2023-02-15: qty 2

## 2023-02-15 MED ORDER — ALUM & MAG HYDROXIDE-SIMETH 200-200-20 MG/5ML PO SUSP
30.0000 mL | Freq: Once | ORAL | Status: AC
  Administered 2023-02-15: 30 mL via ORAL
  Filled 2023-02-15: qty 30

## 2023-02-15 MED ORDER — ONDANSETRON 4 MG PO TBDP
4.0000 mg | ORAL_TABLET | Freq: Once | ORAL | Status: AC
  Administered 2023-02-15: 4 mg via ORAL
  Filled 2023-02-15: qty 1

## 2023-02-15 NOTE — ED Notes (Signed)
Reviewed D/C information with the patient, pt verbalized understanding. No additional concerns at this time.
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
## 2023-02-15 NOTE — Discharge Instructions (Signed)
You were evaluated in the Emergency Department and after careful evaluation, we did not find any emergent condition requiring admission or further testing in the hospital.  Your exam/testing today is overall reassuring.  Recommend using the Carafate up to 4 times daily to see if this helps with your abdomen/chest pain.  Continue your Protonix, follow-up with your regular doctors.  Please return to the Emergency Department if you experience any worsening of your condition.   Thank you for allowing [place] to be a part of your care.

## 2023-03-02 ENCOUNTER — Other Ambulatory Visit: Payer: Self-pay

## 2023-03-02 ENCOUNTER — Emergency Department (HOSPITAL_COMMUNITY): Payer: Medicaid Other

## 2023-03-02 ENCOUNTER — Encounter (HOSPITAL_COMMUNITY): Payer: Self-pay | Admitting: *Deleted

## 2023-03-02 ENCOUNTER — Emergency Department (HOSPITAL_COMMUNITY)
Admission: EM | Admit: 2023-03-02 | Discharge: 2023-03-02 | Disposition: A | Discharge status: Home / Self Care | Payer: Medicaid Other | Attending: Emergency Medicine | Admitting: Emergency Medicine

## 2023-03-02 DIAGNOSIS — R0600 Dyspnea, unspecified: Secondary | ICD-10-CM | POA: Insufficient documentation

## 2023-03-02 DIAGNOSIS — F17210 Nicotine dependence, cigarettes, uncomplicated: Secondary | ICD-10-CM | POA: Diagnosis not present

## 2023-03-02 DIAGNOSIS — I10 Essential (primary) hypertension: Secondary | ICD-10-CM | POA: Insufficient documentation

## 2023-03-02 DIAGNOSIS — R062 Wheezing: Secondary | ICD-10-CM | POA: Insufficient documentation

## 2023-03-02 DIAGNOSIS — Z1152 Encounter for screening for COVID-19: Secondary | ICD-10-CM | POA: Diagnosis not present

## 2023-03-02 DIAGNOSIS — J449 Chronic obstructive pulmonary disease, unspecified: Secondary | ICD-10-CM | POA: Insufficient documentation

## 2023-03-02 DIAGNOSIS — Z7951 Long term (current) use of inhaled steroids: Secondary | ICD-10-CM | POA: Insufficient documentation

## 2023-03-02 DIAGNOSIS — R0602 Shortness of breath: Secondary | ICD-10-CM | POA: Diagnosis present

## 2023-03-02 DIAGNOSIS — Z79899 Other long term (current) drug therapy: Secondary | ICD-10-CM | POA: Insufficient documentation

## 2023-03-02 LAB — CBC WITH DIFFERENTIAL/PLATELET
Abs Immature Granulocytes: 0.02 10*3/uL (ref 0.00–0.07)
Basophils Absolute: 0.1 10*3/uL (ref 0.0–0.1)
Basophils Relative: 1 %
Eosinophils Absolute: 1.3 10*3/uL — ABNORMAL HIGH (ref 0.0–0.5)
Eosinophils Relative: 14 %
HCT: 44.3 % (ref 36.0–46.0)
Hemoglobin: 14.5 g/dL (ref 12.0–15.0)
Immature Granulocytes: 0 %
Lymphocytes Relative: 31 %
Lymphs Abs: 2.8 10*3/uL (ref 0.7–4.0)
MCH: 30.3 pg (ref 26.0–34.0)
MCHC: 32.7 g/dL (ref 30.0–36.0)
MCV: 92.7 fL (ref 80.0–100.0)
Monocytes Absolute: 0.7 10*3/uL (ref 0.1–1.0)
Monocytes Relative: 8 %
Neutro Abs: 4 10*3/uL (ref 1.7–7.7)
Neutrophils Relative %: 46 %
Platelets: 343 10*3/uL (ref 150–400)
RBC: 4.78 MIL/uL (ref 3.87–5.11)
RDW: 14.8 % (ref 11.5–15.5)
WBC: 8.9 10*3/uL (ref 4.0–10.5)
nRBC: 0 % (ref 0.0–0.2)

## 2023-03-02 LAB — RESP PANEL BY RT-PCR (RSV, FLU A&B, COVID)  RVPGX2
Influenza A by PCR: NEGATIVE
Influenza B by PCR: NEGATIVE
Resp Syncytial Virus by PCR: NEGATIVE
SARS Coronavirus 2 by RT PCR: NEGATIVE

## 2023-03-02 LAB — BASIC METABOLIC PANEL
Anion gap: 9 (ref 5–15)
BUN: 6 mg/dL (ref 6–20)
CO2: 26 mmol/L (ref 22–32)
Calcium: 9.2 mg/dL (ref 8.9–10.3)
Chloride: 105 mmol/L (ref 98–111)
Creatinine, Ser: 0.79 mg/dL (ref 0.44–1.00)
GFR, Estimated: >60 mL/min (ref >60)
Glucose, Bld: 87 mg/dL (ref 70–99)
Potassium: 3.7 mmol/L (ref 3.5–5.1)
Sodium: 140 mmol/L (ref 135–145)

## 2023-03-02 LAB — HCG, SERUM, QUALITATIVE: Preg, Serum: NEGATIVE

## 2023-03-02 MED ORDER — SODIUM CHLORIDE 0.9 % IV BOLUS
1000.0000 mL | Freq: Once | INTRAVENOUS | Status: AC
  Administered 2023-03-02: 1000 mL via INTRAVENOUS

## 2023-03-02 MED ORDER — VENTOLIN HFA 108 (90 BASE) MCG/ACT IN AERS: 1.0000 | INHALATION_SPRAY | RESPIRATORY_TRACT | 1 refills | Status: DC | PRN

## 2023-03-02 MED ORDER — AZITHROMYCIN 250 MG PO TABS: 250.0000 mg | ORAL_TABLET | Freq: Every day | ORAL | 0 refills | Status: DC

## 2023-03-02 MED ORDER — IPRATROPIUM-ALBUTEROL 0.5-2.5 (3) MG/3ML IN SOLN
3.0000 mL | Freq: Once | RESPIRATORY_TRACT | Status: AC
  Administered 2023-03-02: 3 mL via RESPIRATORY_TRACT
  Filled 2023-03-02: qty 3

## 2023-03-02 MED ORDER — ALBUTEROL SULFATE (2.5 MG/3ML) 0.083% IN NEBU
2.5000 mg | INHALATION_SOLUTION | Freq: Four times a day (QID) | RESPIRATORY_TRACT | 12 refills | Status: DC | PRN
Start: 2023-03-02 — End: 2023-10-29

## 2023-03-02 MED ORDER — GUAIFENESIN-DM 100-10 MG/5ML PO SYRP: 5.0000 mL | ORAL_SOLUTION | ORAL | 0 refills | Status: DC | PRN

## 2023-03-02 MED ORDER — METHYLPREDNISOLONE SODIUM SUCC 125 MG IJ SOLR
125.0000 mg | Freq: Once | INTRAMUSCULAR | Status: AC
  Administered 2023-03-02: 125 mg via INTRAVENOUS
  Filled 2023-03-02: qty 2

## 2023-03-02 MED ORDER — PREDNISONE 20 MG PO TABS
40.0000 mg | ORAL_TABLET | Freq: Every day | ORAL | 0 refills | Status: AC
Start: 2023-03-02 — End: 2023-03-07

## 2023-03-02 MED ORDER — MAGNESIUM SULFATE 2 GM/50ML IV SOLN
2.0000 g | Freq: Once | INTRAVENOUS | Status: AC
  Administered 2023-03-02: 2 g via INTRAVENOUS
  Filled 2023-03-02: qty 50
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
## 2023-03-02 NOTE — Discharge Instructions (Addendum)
It was a pleasure caring for you today in the emergency department.  Please follow-up Dr. [name] (lung doctor) for further evaluation of your breathing abnormalities  Please return to the emergency department for any worsening or worrisome symptoms.

## 2023-03-02 NOTE — ED Triage Notes (Signed)
Pt c/o SOB that started this morning when she woke up. She couldn't find her rescue inhaler at home to use. Pt has inspiratory and expiratory wheezing at time of triage, O2 sat 95% RA, RR 20.

## 2023-04-04 ENCOUNTER — Encounter: Payer: Medicaid Other | Admitting: Internal Medicine

## 2023-04-04 NOTE — Progress Notes (Signed)
Erroneous encounter - please disregard.

## 2023-05-01 ENCOUNTER — Emergency Department (HOSPITAL_COMMUNITY)
Admission: EM | Admit: 2023-05-01 | Discharge: 2023-05-01 | Disposition: A | Discharge status: Home / Self Care | Payer: Medicaid Other | Attending: Emergency Medicine | Admitting: Emergency Medicine

## 2023-05-01 ENCOUNTER — Other Ambulatory Visit: Payer: Self-pay

## 2023-05-01 DIAGNOSIS — D72829 Elevated white blood cell count, unspecified: Secondary | ICD-10-CM | POA: Diagnosis not present

## 2023-05-01 DIAGNOSIS — F17200 Nicotine dependence, unspecified, uncomplicated: Secondary | ICD-10-CM | POA: Insufficient documentation

## 2023-05-01 DIAGNOSIS — F1290 Cannabis use, unspecified, uncomplicated: Secondary | ICD-10-CM | POA: Insufficient documentation

## 2023-05-01 DIAGNOSIS — R112 Nausea with vomiting, unspecified: Secondary | ICD-10-CM

## 2023-05-01 DIAGNOSIS — R109 Unspecified abdominal pain: Secondary | ICD-10-CM | POA: Diagnosis present

## 2023-05-01 LAB — URINALYSIS, ROUTINE W REFLEX MICROSCOPIC
Bilirubin Urine: NEGATIVE
Glucose, UA: NEGATIVE mg/dL
Hgb urine dipstick: NEGATIVE
Ketones, ur: NEGATIVE mg/dL
Leukocytes,Ua: NEGATIVE
Nitrite: NEGATIVE
Protein, ur: NEGATIVE mg/dL
Specific Gravity, Urine: 1.003 — ABNORMAL LOW (ref 1.005–1.030)
pH: 6 (ref 5.0–8.0)

## 2023-05-01 LAB — COMPREHENSIVE METABOLIC PANEL
ALT: 48 U/L — ABNORMAL HIGH (ref 0–44)
AST: 30 U/L (ref 15–41)
Albumin: 4.5 g/dL (ref 3.5–5.0)
Alkaline Phosphatase: 87 U/L (ref 38–126)
Anion gap: 7 (ref 5–15)
BUN: 7 mg/dL (ref 6–20)
CO2: 23 mmol/L (ref 22–32)
Calcium: 9.7 mg/dL (ref 8.9–10.3)
Chloride: 108 mmol/L (ref 98–111)
Creatinine, Ser: 0.54 mg/dL (ref 0.44–1.00)
GFR, Estimated: >60 mL/min (ref >60)
Glucose, Bld: 100 mg/dL — ABNORMAL HIGH (ref 70–99)
Potassium: 3.6 mmol/L (ref 3.5–5.1)
Sodium: 138 mmol/L (ref 135–145)
Total Bilirubin: 0.5 mg/dL (ref 0.0–1.2)
Total Protein: 7.8 g/dL (ref 6.5–8.1)

## 2023-05-01 LAB — LIPASE, BLOOD: Lipase: 25 U/L (ref 11–51)

## 2023-05-01 LAB — POC URINE PREG, ED: Preg Test, Ur: NEGATIVE

## 2023-05-01 LAB — CBC
HCT: 41.3 % (ref 36.0–46.0)
Hemoglobin: 13.8 g/dL (ref 12.0–15.0)
MCH: 31.3 pg (ref 26.0–34.0)
MCHC: 33.4 g/dL (ref 30.0–36.0)
MCV: 93.7 fL (ref 80.0–100.0)
Platelets: 380 10*3/uL (ref 150–400)
RBC: 4.41 MIL/uL (ref 3.87–5.11)
RDW: 14.2 % (ref 11.5–15.5)
WBC: 11.3 10*3/uL — ABNORMAL HIGH (ref 4.0–10.5)
nRBC: 0 % (ref 0.0–0.2)

## 2023-05-01 LAB — RAPID URINE DRUG SCREEN, HOSP PERFORMED
Amphetamines: NOT DETECTED
Barbiturates: NOT DETECTED
Benzodiazepines: NOT DETECTED
Cocaine: NOT DETECTED
Opiates: NOT DETECTED
Tetrahydrocannabinol: POSITIVE — AB

## 2023-05-01 MED ORDER — DIAZEPAM 5 MG/ML IJ SOLN
5.0000 mg | Freq: Once | INTRAMUSCULAR | Status: AC
  Administered 2023-05-01: 5 mg via INTRAVENOUS
  Filled 2023-05-01: qty 2

## 2023-05-01 MED ORDER — KETOROLAC TROMETHAMINE 30 MG/ML IJ SOLN
30.0000 mg | Freq: Once | INTRAMUSCULAR | Status: AC
  Administered 2023-05-01: 30 mg via INTRAVENOUS
  Filled 2023-05-01: qty 1

## 2023-05-01 MED ORDER — DROPERIDOL 2.5 MG/ML IJ SOLN
1.2500 mg | Freq: Once | INTRAMUSCULAR | Status: AC
  Administered 2023-05-01: 1.25 mg via INTRAVENOUS
  Filled 2023-05-01: qty 2
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
## 2023-05-01 NOTE — Discharge Instructions (Signed)
 Return to the emergency department if you cannot hold down any fluid or run a high fever.  Please discontinue using marijuana and read the attached information about cannabinoid hyperemesis syndrome and recurrent abdominal pain and vomiting.  Follow closely with your primary care physician.

## 2023-05-01 NOTE — ED Triage Notes (Signed)
 Pt c/o abdominal pain, N/V/D. History of pancreatitis.

## 2023-05-02 ENCOUNTER — Encounter (HOSPITAL_COMMUNITY): Payer: Self-pay | Admitting: Emergency Medicine

## 2023-05-02 ENCOUNTER — Emergency Department (HOSPITAL_COMMUNITY): Payer: Medicaid Other

## 2023-05-02 ENCOUNTER — Emergency Department (HOSPITAL_COMMUNITY)
Admission: EM | Admit: 2023-05-02 | Discharge: 2023-05-02 | Disposition: A | Discharge status: Home / Self Care | Payer: Medicaid Other | Attending: Emergency Medicine | Admitting: Emergency Medicine

## 2023-05-02 ENCOUNTER — Other Ambulatory Visit: Payer: Self-pay

## 2023-05-02 DIAGNOSIS — K529 Noninfective gastroenteritis and colitis, unspecified: Secondary | ICD-10-CM | POA: Diagnosis not present

## 2023-05-02 DIAGNOSIS — R109 Unspecified abdominal pain: Secondary | ICD-10-CM | POA: Diagnosis present

## 2023-05-02 DIAGNOSIS — K861 Other chronic pancreatitis: Secondary | ICD-10-CM | POA: Diagnosis not present

## 2023-05-02 LAB — CBC WITH DIFFERENTIAL/PLATELET
Abs Immature Granulocytes: 0.04 10*3/uL (ref 0.00–0.07)
Basophils Absolute: 0.1 10*3/uL (ref 0.0–0.1)
Basophils Relative: 1 %
Eosinophils Absolute: 0.9 10*3/uL — ABNORMAL HIGH (ref 0.0–0.5)
Eosinophils Relative: 7 %
HCT: 45.3 % (ref 36.0–46.0)
Hemoglobin: 14.8 g/dL (ref 12.0–15.0)
Immature Granulocytes: 0 %
Lymphocytes Relative: 22 %
Lymphs Abs: 2.8 10*3/uL (ref 0.7–4.0)
MCH: 30.9 pg (ref 26.0–34.0)
MCHC: 32.7 g/dL (ref 30.0–36.0)
MCV: 94.6 fL (ref 80.0–100.0)
Monocytes Absolute: 1.3 10*3/uL — ABNORMAL HIGH (ref 0.1–1.0)
Monocytes Relative: 10 %
Neutro Abs: 7.6 10*3/uL (ref 1.7–7.7)
Neutrophils Relative %: 60 %
Platelets: 434 10*3/uL — ABNORMAL HIGH (ref 150–400)
RBC: 4.79 MIL/uL (ref 3.87–5.11)
RDW: 14.3 % (ref 11.5–15.5)
WBC: 12.7 10*3/uL — ABNORMAL HIGH (ref 4.0–10.5)
nRBC: 0 % (ref 0.0–0.2)

## 2023-05-02 LAB — BASIC METABOLIC PANEL
Anion gap: 11 (ref 5–15)
BUN: 9 mg/dL (ref 6–20)
CO2: 24 mmol/L (ref 22–32)
Calcium: 9.7 mg/dL (ref 8.9–10.3)
Chloride: 102 mmol/L (ref 98–111)
Creatinine, Ser: 0.79 mg/dL (ref 0.44–1.00)
GFR, Estimated: >60 mL/min (ref >60)
Glucose, Bld: 126 mg/dL — ABNORMAL HIGH (ref 70–99)
Potassium: 3.6 mmol/L (ref 3.5–5.1)
Sodium: 137 mmol/L (ref 135–145)

## 2023-05-02 LAB — URINALYSIS, ROUTINE W REFLEX MICROSCOPIC
Bacteria, UA: NONE SEEN
Bilirubin Urine: NEGATIVE
Glucose, UA: 50 mg/dL — AB
Hgb urine dipstick: NEGATIVE
Ketones, ur: NEGATIVE mg/dL
Leukocytes,Ua: NEGATIVE
Nitrite: NEGATIVE
Protein, ur: 30 mg/dL — AB
Specific Gravity, Urine: 1.008 (ref 1.005–1.030)
pH: 5 (ref 5.0–8.0)

## 2023-05-02 LAB — LIPASE, BLOOD: Lipase: 24 U/L (ref 11–51)

## 2023-05-02 MED ORDER — OXYCODONE-ACETAMINOPHEN 5-325 MG PO TABS
2.0000 | ORAL_TABLET | Freq: Once | ORAL | Status: AC
  Administered 2023-05-02: 2 via ORAL
  Filled 2023-05-02: qty 2

## 2023-05-02 MED ORDER — ONDANSETRON HCL 4 MG PO TABS: 4.0000 mg | ORAL_TABLET | Freq: Four times a day (QID) | ORAL | 0 refills | Status: DC

## 2023-05-02 MED ORDER — ONDANSETRON 4 MG PO TBDP
4.0000 mg | ORAL_TABLET | Freq: Once | ORAL | Status: AC
  Administered 2023-05-02: 4 mg via ORAL
  Filled 2023-05-02: qty 1

## 2023-05-02 MED ORDER — DROPERIDOL 2.5 MG/ML IJ SOLN
1.2500 mg | Freq: Once | INTRAMUSCULAR | Status: AC
  Administered 2023-05-02: 1.25 mg via INTRAVENOUS
  Filled 2023-05-02: qty 2

## 2023-05-02 MED ORDER — HYDROMORPHONE HCL 1 MG/ML IJ SOLN
0.5000 mg | Freq: Once | INTRAMUSCULAR | Status: AC
  Administered 2023-05-02: 0.5 mg via INTRAVENOUS
  Filled 2023-05-02: qty 0.5

## 2023-05-02 MED ORDER — ACETAMINOPHEN 325 MG PO TABS: 650.0000 mg | ORAL_TABLET | Freq: Once | ORAL | Status: DC

## 2023-05-02 MED ORDER — DIPHENHYDRAMINE HCL 50 MG/ML IJ SOLN
25.0000 mg | Freq: Once | INTRAMUSCULAR | Status: AC
  Administered 2023-05-02: 25 mg via INTRAVENOUS
  Filled 2023-05-02: qty 1

## 2023-05-02 MED ORDER — IOHEXOL 300 MG/ML  SOLN
100.0000 mL | Freq: Once | INTRAMUSCULAR | Status: AC | PRN
  Administered 2023-05-02: 100 mL via INTRAVENOUS

## 2023-05-02 NOTE — ED Provider Triage Note (Signed)
 Emergency Medicine Provider Triage Evaluation Note  [name] , a 38 y.o. female  was evaluated in triage.  Pt complains of back pain with abd pain.  Review of Systems  Positive:  Negative:   Physical Exam  BP (!) 149/122   Pulse (!) 111   Temp 98.6 F (37 C) (Oral)   Resp 18   Ht 5' 4.5 (1.638 m)   Wt 61 kg   LMP 04/30/2023 (Exact Date)   SpO2 99%   BMI 22.73 kg/m  Gen:   Awake, no distress   Resp:  Normal effort  MSK:   Moves extremities without difficulty  Other:    Medical Decision Making  Medically screening exam initiated at 6:47 PM.  Appropriate orders placed.  [name] was informed that the remainder of the evaluation will be completed by another provider, this initial triage assessment does not replace that evaluation, and the importance of remaining in the ED until their evaluation is complete.  Symptoms started yesterday with abdominal pain - periumbilical to back. Today has bilateral flank pain, less abdominal pain. +N, V. No urinary symptoms.    Odell Balls, PA-C 05/02/23 1849

## 2023-05-02 NOTE — ED Triage Notes (Signed)
 Pt reports abd pains yesterday with n/v/d. Today back pain has worsened and left arm has been numb and tingling.

## 2023-05-02 NOTE — ED Notes (Signed)
 Patient walked out without discharge instructions. Patient removed IV and left.
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
## 2023-05-02 NOTE — Discharge Instructions (Signed)
 You were seen for your abdominal pain and vomiting in the emergency department.   At home, please take the Zofran  represcribed you for your nausea and vomiting.  Take Tylenol  for your pain.    Check your MyChart online for the results of any tests that had not resulted by the time you left the emergency department.   Follow-up with your primary doctor in 2-3 days regarding your visit.    Return immediately to the emergency department if you experience any concerning symptoms.    Thank you for visiting our Emergency Department. It was a pleasure taking care of you today.

## 2023-06-04 ENCOUNTER — Emergency Department (HOSPITAL_COMMUNITY)
Admission: EM | Admit: 2023-06-04 | Discharge: 2023-06-04 | Disposition: A | Discharge status: Home / Self Care | Payer: Medicaid Other

## 2023-06-04 ENCOUNTER — Encounter (HOSPITAL_COMMUNITY): Payer: Self-pay

## 2023-06-04 ENCOUNTER — Other Ambulatory Visit: Payer: Self-pay

## 2023-06-04 DIAGNOSIS — K0381 Cracked tooth: Secondary | ICD-10-CM | POA: Diagnosis not present

## 2023-06-04 DIAGNOSIS — K0889 Other specified disorders of teeth and supporting structures: Secondary | ICD-10-CM | POA: Diagnosis present

## 2023-06-04 MED ORDER — AMOXICILLIN-POT CLAVULANATE 875-125 MG PO TABS
1.0000 | ORAL_TABLET | Freq: Once | ORAL | Status: AC
  Administered 2023-06-04: 1 via ORAL
  Filled 2023-06-04: qty 1

## 2023-06-04 MED ORDER — BUPIVACAINE-EPINEPHRINE (PF) 0.5% -1:200000 IJ SOLN
1.8000 mL | Freq: Once | INTRAMUSCULAR | Status: AC
  Administered 2023-06-04: 1.8 mL
  Filled 2023-06-04: qty 1.8

## 2023-06-04 MED ORDER — AMOXICILLIN-POT CLAVULANATE 875-125 MG PO TABS: 1.0000 | ORAL_TABLET | Freq: Two times a day (BID) | ORAL | 0 refills | Status: DC

## 2023-06-04 NOTE — ED Triage Notes (Signed)
 Pt here due to infection from broken tooth a few weeks ago. Pt stated that she was put on amoxicillin for the infection in order to schedule a root canal in the future. Pt stated that she finished her Amox but the infection has gotten worse and she was told to come her for IV antibiotics

## 2023-06-04 NOTE — ED Provider Notes (Signed)
 [place] EMERGENCY DEPARTMENT AT [facility] ([place] Provider Note   CSN: [id number] Arrival date & time: 06/04/23  1804     History  Chief Complaint  Patient presents with   Dental Injury    [name] is a 38 y.o. female.  She has PMH of POTS, chronic pancreatitis, presents the ER for left upper dental pain ongoing for over a week, she saw her dentist and was prescribed amoxicillin with plan to have surgery to have the tooth removed and then have a dental implant in this area.  She states initially the amoxicillin seem to help but she stopped it several days ago after finishing the prescription it is come back and gotten much worse.  She states the area is draining infection and she can taste it.  She denies any fever or chills.   Dental Injury       Home Medications Prior to Admission medications   Medication Sig Start Date End Date Taking? Authorizing Provider  acetaminophen (TYLENOL) 500 MG tablet Take 1,000 mg by mouth every 6 (six) hours as needed for mild pain or moderate pain.    [provider]  albuterol (PROVENTIL) (2.5 MG/3ML) 0.083% nebulizer solution Take 3 mLs (2.5 mg total) by nebulization every 6 (six) hours as needed for wheezing or shortness of breath. 03/02/23   Sloan Leiter, DO  albuterol (VENTOLIN HFA) 108 (90 Base) MCG/ACT inhaler Inhale 1-2 puffs into the lungs every 4 (four) hours as needed for wheezing or shortness of breath. 03/02/23   Sloan Leiter, DO  amoxicillin (AMOXIL) 500 MG capsule Take 500 mg by mouth 3 (three) times daily. Patient not taking: Reported on 03/02/2023    [provider]  azithromycin (ZITHROMAX) 250 MG tablet Take 1 tablet (250 mg total) by mouth daily. Take first 2 tablets together, then 1 every day until finished. 03/02/23   Sloan Leiter, DO  diclofenac Sodium (VOLTAREN) 1 % GEL Apply 2 g topically daily as needed (pain).    [provider]  guaiFENesin-dextromethorphan (ROBITUSSIN DM)  100-10 MG/5ML syrup Take 5 mLs by mouth every 4 (four) hours as needed for cough. 03/02/23   Sloan Leiter, DO  lipase/protease/amylase (CREON) 36000 UNITS CPEP capsule Take 2 capsules (72,000 Units total) by mouth 3 (three) times daily with meals AND 1 capsule (36,000 Units total) with snacks. Patient not taking: Reported on 03/02/2023 08/16/22   Letta Median, PA-C  metoprolol tartrate (LOPRESSOR) 25 MG tablet Take 1 tablet (25 mg total) by mouth 2 (two) times daily. 10/05/22 01/03/23  Mallipeddi, Vishnu P, MD  ondansetron (ZOFRAN) 4 MG tablet Take 1 tablet (4 mg total) by mouth every 6 (six) hours. 05/02/23   Rondel Baton, MD  Oxycodone HCl 10 MG TABS Take 10 mg by mouth every 8 (eight) hours. 02/23/23   [provider]  oxyCODONE-acetaminophen (PERCOCET/ROXICET) 5-325 MG tablet Take 1 tablet by mouth every 6 (six) hours as needed for severe pain. Patient not taking: Reported on 03/02/2023 12/22/21   Jacalyn Lefevre, MD  pantoprazole (PROTONIX) 40 MG tablet Take 40 mg by mouth every evening. 08/19/17   [provider]  promethazine (PHENERGAN) 25 MG tablet Take 25 mg by mouth every 4 (four) hours as needed for vomiting or nausea. 12/15/21   [provider]  propranolol (INDERAL) 10 MG tablet Take 1 tablet (10 mg total) by mouth 3 (three) times daily. 11/19/22   Mallipeddi, Vishnu P, MD  sucralfate (CARAFATE) 1 g  tablet Take 1 tablet (1 g total) by mouth 4 (four) times daily as needed. Patient not taking: Reported on 03/02/2023 02/15/23   Sabas Sous, MD      Allergies    Bee venom, Other, Fentanyl, Morphine, Toradol [ketorolac tromethamine], and Reglan [metoclopramide]    Review of Systems   Review of Systems  Physical Exam Updated Vital Signs BP (!) 123/98 (BP Location: Right Arm)   Pulse 90   Temp 98.8 F (37.1 C) (Oral)   Resp 18   Ht 5\' 5"  (1.651 m)   Wt 60.8 kg   SpO2 99%   BMI 22.30 kg/m  Physical Exam Vitals and nursing note reviewed.   Constitutional:      General: She is not in acute distress.    Appearance: She is well-developed.  HENT:     Head: Normocephalic and atraumatic.     Mouth/Throat:     Mouth: Mucous membranes are moist.     Pharynx: Oropharynx is clear. Uvula midline.      Comments: #5 tooth is broken, no gingival abscess Eyes:     Extraocular Movements: Extraocular movements intact.     Conjunctiva/sclera: Conjunctivae normal.     Pupils: Pupils are equal, round, and reactive to light.  Cardiovascular:     Rate and Rhythm: Normal rate and regular rhythm.     Heart sounds: No murmur heard. Pulmonary:     Effort: Pulmonary effort is normal. No respiratory distress.     Breath sounds: Normal breath sounds.  Abdominal:     Palpations: Abdomen is soft.     Tenderness: There is no abdominal tenderness.  Musculoskeletal:        General: No swelling.     Cervical back: Neck supple.  Skin:    General: Skin is warm and dry.     Capillary Refill: Capillary refill takes less than 2 seconds.  Neurological:     General: No focal deficit present.     Mental Status: She is alert and oriented to person, place, and time.  Psychiatric:        Mood and Affect: Mood normal.     ED Results / Procedures / Treatments   Labs (all labs ordered are listed, but only abnormal results are displayed) Labs Reviewed - No data to display  EKG None  Radiology No results found.  Procedures Dental Block  Date/Time: 06/04/2023 7:03 PM  Performed by: Ma Rings, PA-C Authorized by: Ma Rings, PA-C   Consent:    Consent obtained:  Verbal   Consent given by:  Patient   Risks discussed:  Allergic reaction, infection, nerve damage, swelling, hematoma, unsuccessful block, intravascular injection and pain   Alternatives discussed:  No treatment Universal protocol:    Patient identity confirmed:  Verbally with patient Indications:    Indications: dental pain   Location:    Block type:   Supraperiosteal Procedure details:    Syringe type:  Luer lock syringe   Needle gauge:  25 G   Anesthetic injected:  Bupivacaine 0.25% WITH epi Post-procedure details:    Outcome:  Pain relieved   Procedure completion:  Tolerated well, no immediate complications     Medications Ordered in ED Medications  amoxicillin-clavulanate (AUGMENTIN) 875-125 MG per tablet 1 tablet (1 tablet Oral Given 06/04/23 1834)  bupivacaine-epinephrine (PF) (MARCAINE W/ EPI) 0.5% -1:200000 injection 1.8 mL (1.8 mLs Infiltration Given 06/04/23 1855)    ED Course/ Medical Decision Making/ A&P  Medical Decision Making Ddx: dental abscess, dental caries, dental fracture, alveolar osteitis, ludwigs angina, other ED course: Patient present with pain in the 5 tooth. They speak in full and clear sentences,  handle their oral secretions well. There is no sign of deep space abscess. There is no drainable collection.  Pain management: Pt given bupivacaine dental block for pain control Antibiotics provided for dental infection, patient was provided with outpatient dental resources and advised to come back to the ED for any new or worsening symptoms.  Will switch to Augmentin since she just had amoxicillin which improved but symptoms came back.   Risk Prescription drug management.           Final Clinical Impression(s) / ED Diagnoses Final diagnoses:  None    Rx / DC Orders ED Discharge Orders     None         Josem Kaufmann 06/04/23 1909    Durwin Glaze, MD 06/04/23 2311

## 2023-06-04 NOTE — Discharge Instructions (Addendum)
 Follow-up with your dentist.  Continue daily ibuprofen and take the Augmentin.  Come back to the ER for new or worsening symptoms.

## 2023-06-05 ENCOUNTER — Other Ambulatory Visit: Payer: Self-pay

## 2023-06-05 ENCOUNTER — Emergency Department (HOSPITAL_COMMUNITY)
Admission: EM | Admit: 2023-06-05 | Discharge: 2023-06-05 | Disposition: A | Discharge status: Home / Self Care | Payer: Medicaid Other | Attending: Emergency Medicine | Admitting: Emergency Medicine

## 2023-06-05 ENCOUNTER — Encounter (HOSPITAL_COMMUNITY): Payer: Self-pay | Admitting: *Deleted

## 2023-06-05 DIAGNOSIS — K0889 Other specified disorders of teeth and supporting structures: Secondary | ICD-10-CM | POA: Insufficient documentation

## 2023-06-05 MED ORDER — KETOROLAC TROMETHAMINE 15 MG/ML IJ SOLN
15.0000 mg | Freq: Once | INTRAMUSCULAR | Status: DC
  Filled 2023-06-05: qty 1

## 2023-06-05 MED ORDER — CHLORHEXIDINE GLUCONATE 0.12 % MT SOLN: 15.0000 mL | Freq: Two times a day (BID) | OROMUCOSAL | 0 refills | Status: DC

## 2023-06-05 MED ORDER — BUPIVACAINE-EPINEPHRINE (PF) 0.5% -1:200000 IJ SOLN
1.8000 mL | Freq: Once | INTRAMUSCULAR | Status: AC
  Administered 2023-06-05: 1.8 mL
  Filled 2023-06-05: qty 1.8

## 2023-06-05 NOTE — ED Provider Notes (Signed)
 [place] EMERGENCY DEPARTMENT AT [facility] Provider Note   CSN: [id number] Arrival date & time: 06/05/23  1454     History  Chief Complaint  Patient presents with   Dental Pain    [name] is a 38 y.o. female. Here for left upper dental pain, has been ongoing, seen last night and given dental block and Augmentin, she states dental block was partially effective but is worn off, still having pain.  She picked up her Augmentin.  Plans to go to her dentist tomorrow morning.  No fevers or chills, no trouble swallowing or breathing.  She feels like a jagged piece of her tooth is cutting into the gums.  Dental Pain      Home Medications Prior to Admission medications   Medication Sig Start Date End Date Taking? Authorizing Provider  chlorhexidine (PERIDEX) 0.12 % solution Use as directed 15 mLs in the mouth or throat 2 (two) times daily. 06/05/23  Yes Salem Lembke A, PA-C  acetaminophen (TYLENOL) 500 MG tablet Take 1,000 mg by mouth every 6 (six) hours as needed for mild pain or moderate pain.    [provider]  albuterol (PROVENTIL) (2.5 MG/3ML) 0.083% nebulizer solution Take 3 mLs (2.5 mg total) by nebulization every 6 (six) hours as needed for wheezing or shortness of breath. 03/02/23   Sloan Leiter, DO  albuterol (VENTOLIN HFA) 108 (90 Base) MCG/ACT inhaler Inhale 1-2 puffs into the lungs every 4 (four) hours as needed for wheezing or shortness of breath. 03/02/23   Sloan Leiter, DO  amoxicillin (AMOXIL) 500 MG capsule Take 500 mg by mouth 3 (three) times daily. Patient not taking: Reported on 03/02/2023    [provider]  amoxicillin-clavulanate (AUGMENTIN) 875-125 MG tablet Take 1 tablet by mouth every 12 (twelve) hours. 06/04/23   Carmel Sacramento A, PA-C  azithromycin (ZITHROMAX) 250 MG tablet Take 1 tablet (250 mg total) by mouth daily. Take first 2 tablets together, then 1 every day until finished. 03/02/23   Sloan Leiter, DO   diclofenac Sodium (VOLTAREN) 1 % GEL Apply 2 g topically daily as needed (pain).    [provider]  guaiFENesin-dextromethorphan (ROBITUSSIN DM) 100-10 MG/5ML syrup Take 5 mLs by mouth every 4 (four) hours as needed for cough. 03/02/23   Sloan Leiter, DO  lipase/protease/amylase (CREON) 36000 UNITS CPEP capsule Take 2 capsules (72,000 Units total) by mouth 3 (three) times daily with meals AND 1 capsule (36,000 Units total) with snacks. Patient not taking: Reported on 03/02/2023 08/16/22   Letta Median, PA-C  metoprolol tartrate (LOPRESSOR) 25 MG tablet Take 1 tablet (25 mg total) by mouth 2 (two) times daily. 10/05/22 01/03/23  Mallipeddi, Vishnu P, MD  ondansetron (ZOFRAN) 4 MG tablet Take 1 tablet (4 mg total) by mouth every 6 (six) hours. 05/02/23   Rondel Baton, MD  Oxycodone HCl 10 MG TABS Take 10 mg by mouth every 8 (eight) hours. 02/23/23   [provider]  oxyCODONE-acetaminophen (PERCOCET/ROXICET) 5-325 MG tablet Take 1 tablet by mouth every 6 (six) hours as needed for severe pain. Patient not taking: Reported on 03/02/2023 12/22/21   Jacalyn Lefevre, MD  pantoprazole (PROTONIX) 40 MG tablet Take 40 mg by mouth every evening. 08/19/17   [provider]  promethazine (PHENERGAN) 25 MG tablet Take 25 mg by mouth every 4 (four) hours as needed for vomiting or nausea. 12/15/21   [provider]  propranolol (INDERAL) 10 MG tablet Take  1 tablet (10 mg total) by mouth 3 (three) times daily. 11/19/22   Mallipeddi, Vishnu P, MD  sucralfate (CARAFATE) 1 g tablet Take 1 tablet (1 g total) by mouth 4 (four) times daily as needed. Patient not taking: Reported on 03/02/2023 02/15/23   Sabas Sous, MD      Allergies    Bee venom, Other, Fentanyl, Morphine, Toradol [ketorolac tromethamine], and Reglan [metoclopramide]    Review of Systems   Review of Systems  Physical Exam Updated Vital Signs BP (!) 126/109 (BP Location: Right Arm)   Pulse 82   Temp  98.9 F (37.2 C) (Temporal)   Resp 18   LMP 05/27/2023 (Approximate)   SpO2 98%  Physical Exam Vitals and nursing note reviewed.  Constitutional:      General: She is not in acute distress.    Appearance: She is well-developed.  HENT:     Head: Normocephalic and atraumatic.     Mouth/Throat:     Mouth: Mucous membranes are moist.  Eyes:     Conjunctiva/sclera: Conjunctivae normal.  Cardiovascular:     Rate and Rhythm: Normal rate and regular rhythm.     Heart sounds: No murmur heard. Pulmonary:     Effort: Pulmonary effort is normal. No respiratory distress.     Breath sounds: Normal breath sounds.  Abdominal:     Palpations: Abdomen is soft.     Tenderness: There is no abdominal tenderness.  Musculoskeletal:        General: No swelling.     Cervical back: Neck supple.  Skin:    General: Skin is warm and dry.     Capillary Refill: Capillary refill takes less than 2 seconds.  Neurological:     General: No focal deficit present.     Mental Status: She is alert and oriented to person, place, and time.  Psychiatric:        Mood and Affect: Mood normal.     ED Results / Procedures / Treatments   Labs (all labs ordered are listed, but only abnormal results are displayed) Labs Reviewed - No data to display  EKG None  Radiology No results found.  Procedures Dental Block  Date/Time: 06/05/2023 8:32 PM  Performed by: Ma Rings, PA-C Authorized by: Ma Rings, PA-C   Consent:    Consent obtained:  Verbal   Consent given by:  Patient   Risks discussed:  Allergic reaction, infection, nerve damage, swelling, pain and unsuccessful block   Alternatives discussed:  No treatment Universal protocol:    Patient identity confirmed:  Verbally with patient Indications:    Indications: dental pain   Location:    Block type:  Supraperiosteal Procedure details:    Needle gauge:  30 G   Anesthetic injected:  Bupivacaine 0.25% WITH epi Post-procedure details:     Outcome:  Pain improved   Procedure completion:  Tolerated well, no immediate complications     Medications Ordered in ED Medications  bupivacaine-epinephrine (PF) (MARCAINE W/ EPI) 0.5% -1:200000 injection 1.8 mL (has no administration in time range)  ketorolac (TORADOL) 15 MG/ML injection 15 mg (has no administration in time range)    ED Course/ Medical Decision Making/ A&P                                 Medical Decision Making Ddx: dental abscess, dental caries, dental fracture, alveolar osteitis, ludwigs angina, other ED course: Patient  present with pain in the #5 tooth. They speak in full and clear sentences,  handle their oral secretions well. There is no sign of deep space abscess. There is no drainable collection.  Pain management: Pt given dental block for pain control and Toradol.  Patient has reported reaction of anxiety and burning the stomach with Toradol but willing to try this. Antibiotics provided for dental infection yesterday, patient is going to continue these., patient was provided with outpatient dental resources and advised to come back to the ED for any new or worsening symptoms.   Risk Prescription drug management.   Refused Toradol, asking for Dilaudid, she per PDMP it is picked up 120 tablets of oxycodone 10 mg on May 20, 2023 so will not provide further narcotics.  Advised on strict follow-up and return precautions.        Final Clinical Impression(s) / ED Diagnoses Final diagnoses:  None    Rx / DC Orders ED Discharge Orders          Ordered    chlorhexidine (PERIDEX) 0.12 % solution  2 times daily        06/05/23 [address] 06/05/23 2033    Loetta Rough, MD 06/05/23 2251

## 2023-06-05 NOTE — Discharge Instructions (Signed)
 You are seen today for dental pain.  As discussed you can use dental wax over-the-counter to cover the sharp area that is irritating your gums.  I prescribed antiseptic mouthwash.  Go see your dentist tomorrow.  Come back to the ER if you have facial swelling, trouble swallowing or breathing, fevers or other worrisome changes.

## 2023-06-05 NOTE — ED Triage Notes (Signed)
 Pt with a broken tooth to left upper.  Pain to left face.

## 2023-06-06 ENCOUNTER — Encounter: Payer: Self-pay | Admitting: Internal Medicine

## 2023-06-06 ENCOUNTER — Telehealth: Payer: Self-pay | Admitting: Internal Medicine

## 2023-06-06 NOTE — Telephone Encounter (Signed)
 Pt states that she has been speaking with CMA via MyChart in regard to Clearance for Oral Surgery. Pt says that office reached out to here stating that provider needs to complete part regarding anesthesia and fax back. Pt would like a c/b once this has been done. Pease advise

## 2023-06-07 NOTE — Telephone Encounter (Signed)
 Patient was notified via MyChart by V. Marcelle Overlie, CMA that the medical clearance needed for oral surgery was faxed

## 2023-06-13 NOTE — Telephone Encounter (Signed)
 Shonia from [facility] is requesting a callback at [phone] regarding the clearance that was faxed back to them not including the EKG or H and P. They'd like a callback today before 10:00am since pt lives and hour away and they wanted to do procedure today. Please advise

## 2023-06-13 NOTE — Telephone Encounter (Signed)
 Spoke with [name] - informed her the last OV we have is from 10/05/2022 & last EKG from 03/02/2023.  Will fax to:  [phone] at her request.

## 2023-07-20 ENCOUNTER — Emergency Department (HOSPITAL_COMMUNITY)
Admission: EM | Admit: 2023-07-20 | Discharge: 2023-07-20 | Disposition: A | Discharge status: Home / Self Care | Attending: Emergency Medicine | Admitting: Emergency Medicine

## 2023-07-20 ENCOUNTER — Encounter (HOSPITAL_COMMUNITY): Payer: Self-pay | Admitting: *Deleted

## 2023-07-20 ENCOUNTER — Other Ambulatory Visit: Payer: Self-pay

## 2023-07-20 DIAGNOSIS — K047 Periapical abscess without sinus: Secondary | ICD-10-CM | POA: Insufficient documentation

## 2023-07-20 DIAGNOSIS — K0889 Other specified disorders of teeth and supporting structures: Secondary | ICD-10-CM | POA: Diagnosis present

## 2023-07-20 MED ORDER — PENICILLIN V POTASSIUM 500 MG PO TABS: 500.0000 mg | ORAL_TABLET | Freq: Four times a day (QID) | ORAL | 0 refills | Status: AC

## 2023-07-20 MED ORDER — OXYCODONE-ACETAMINOPHEN 5-325 MG PO TABS: ORAL_TABLET | ORAL | 0 refills | Status: DC

## 2023-07-20 MED ORDER — OXYCODONE-ACETAMINOPHEN 5-325 MG PO TABS
1.0000 | ORAL_TABLET | Freq: Once | ORAL | Status: AC
  Administered 2023-07-20: 1 via ORAL
  Filled 2023-07-20: qty 1

## 2023-07-20 NOTE — Discharge Instructions (Signed)
Follow up with a dentist next week. °

## 2023-07-20 NOTE — ED Provider Notes (Signed)
 [place] EMERGENCY DEPARTMENT AT [facility] Provider Note   CSN: [id number] Arrival date & time: 07/20/23  2130     History  Chief Complaint  Patient presents with   Facial Pain   Facial Swelling    [name] is a 38 y.o. female.  Patient with pain in her cheek area next to her left upper teeth.  The history is provided by the patient and medical records. No language interpreter was used.  Dental Pain Location:  Upper Quality:  Aching Severity:  Moderate Onset quality:  Sudden Timing:  Constant Progression:  Worsening Chronicity:  New Context: abscess   Relieved by:  Nothing Associated symptoms: no congestion and no headaches        Home Medications Prior to Admission medications   Medication Sig Start Date End Date Taking? Authorizing Provider  oxyCODONE-acetaminophen (PERCOCET/ROXICET) 5-325 MG tablet Take one pill every 6 hours for pain not relieved by tylenol or motrin alone 07/20/23  Yes Bethann Berkshire, MD  penicillin v potassium (VEETID) 500 MG tablet Take 1 tablet (500 mg total) by mouth 4 (four) times daily for 7 days. 07/20/23 07/27/23 Yes Bethann Berkshire, MD  acetaminophen (TYLENOL) 500 MG tablet Take 1,000 mg by mouth every 6 (six) hours as needed for mild pain or moderate pain.    [provider]  albuterol (PROVENTIL) (2.5 MG/3ML) 0.083% nebulizer solution Take 3 mLs (2.5 mg total) by nebulization every 6 (six) hours as needed for wheezing or shortness of breath. 03/02/23   Sloan Leiter, DO  albuterol (VENTOLIN HFA) 108 (90 Base) MCG/ACT inhaler Inhale 1-2 puffs into the lungs every 4 (four) hours as needed for wheezing or shortness of breath. 03/02/23   Sloan Leiter, DO  amoxicillin (AMOXIL) 500 MG capsule Take 500 mg by mouth 3 (three) times daily. Patient not taking: Reported on 03/02/2023    [provider]  amoxicillin-clavulanate (AUGMENTIN) 875-125 MG tablet Take 1 tablet by mouth every 12 (twelve) hours. 06/04/23    Carmel Sacramento A, PA-C  azithromycin (ZITHROMAX) 250 MG tablet Take 1 tablet (250 mg total) by mouth daily. Take first 2 tablets together, then 1 every day until finished. 03/02/23   Sloan Leiter, DO  chlorhexidine (PERIDEX) 0.12 % solution Use as directed 15 mLs in the mouth or throat 2 (two) times daily. 06/05/23   Carmel Sacramento A, PA-C  diclofenac Sodium (VOLTAREN) 1 % GEL Apply 2 g topically daily as needed (pain).    [provider]  guaiFENesin-dextromethorphan (ROBITUSSIN DM) 100-10 MG/5ML syrup Take 5 mLs by mouth every 4 (four) hours as needed for cough. 03/02/23   Sloan Leiter, DO  lipase/protease/amylase (CREON) 36000 UNITS CPEP capsule Take 2 capsules (72,000 Units total) by mouth 3 (three) times daily with meals AND 1 capsule (36,000 Units total) with snacks. Patient not taking: Reported on 03/02/2023 08/16/22   Letta Median, PA-C  metoprolol tartrate (LOPRESSOR) 25 MG tablet Take 1 tablet (25 mg total) by mouth 2 (two) times daily. 10/05/22 01/03/23  Mallipeddi, Vishnu P, MD  ondansetron (ZOFRAN) 4 MG tablet Take 1 tablet (4 mg total) by mouth every 6 (six) hours. 05/02/23   Rondel Baton, MD  Oxycodone HCl 10 MG TABS Take 10 mg by mouth every 8 (eight) hours. 02/23/23   [provider]  pantoprazole (PROTONIX) 40 MG tablet Take 40 mg by mouth every evening. 08/19/17   [provider]  promethazine (PHENERGAN) 25 MG tablet Take 25 mg  by mouth every 4 (four) hours as needed for vomiting or nausea. 12/15/21   [provider]  propranolol (INDERAL) 10 MG tablet Take 1 tablet (10 mg total) by mouth 3 (three) times daily. 11/19/22   Mallipeddi, Vishnu P, MD  sucralfate (CARAFATE) 1 g tablet Take 1 tablet (1 g total) by mouth 4 (four) times daily as needed. Patient not taking: Reported on 03/02/2023 02/15/23   Sabas Sous, MD      Allergies    Bee venom, Other, Fentanyl, Morphine, Toradol [ketorolac tromethamine], and Reglan [metoclopramide]     Review of Systems   Review of Systems  Constitutional:  Negative for appetite change and fatigue.  HENT:  Negative for congestion, ear discharge and sinus pressure.        Pain in left gum  Eyes:  Negative for discharge.  Respiratory:  Negative for cough.   Cardiovascular:  Negative for chest pain.  Gastrointestinal:  Negative for abdominal pain and diarrhea.  Genitourinary:  Negative for frequency and hematuria.  Musculoskeletal:  Negative for back pain.  Skin:  Negative for rash.  Neurological:  Negative for seizures and headaches.  Psychiatric/Behavioral:  Negative for hallucinations.     Physical Exam Updated Vital Signs BP (!) 137/104 (BP Location: Left Arm)   Pulse 83   Temp 97.6 F (36.4 C) (Oral)   Resp 18   Ht 5\' 4"  (1.626 m)   Wt 60.8 kg   SpO2 99%   BMI 23.00 kg/m  Physical Exam Vitals and nursing note reviewed.  Constitutional:      Appearance: She is well-developed.  HENT:     Head: Normocephalic.     Comments: Tenderness to left gum on upper side    Nose: Nose normal.  Eyes:     General: No scleral icterus.    Conjunctiva/sclera: Conjunctivae normal.  Neck:     Thyroid: No thyromegaly.  Cardiovascular:     Rate and Rhythm: Normal rate and regular rhythm.     Heart sounds: No murmur heard.    No friction rub. No gallop.  Pulmonary:     Breath sounds: No stridor. No wheezing or rales.  Chest:     Chest wall: No tenderness.  Abdominal:     General: There is no distension.     Tenderness: There is no abdominal tenderness. There is no rebound.  Musculoskeletal:        General: Normal range of motion.     Cervical back: Neck supple.  Lymphadenopathy:     Cervical: No cervical adenopathy.  Skin:    Findings: No erythema or rash.  Neurological:     Mental Status: She is alert and oriented to person, place, and time.     Motor: No abnormal muscle tone.     Coordination: Coordination normal.  Psychiatric:        Behavior: Behavior normal.      ED Results / Procedures / Treatments   Labs (all labs ordered are listed, but only abnormal results are displayed) Labs Reviewed - No data to display  EKG None  Radiology No results found.  Procedures Procedures    Medications Ordered in ED Medications  oxyCODONE-acetaminophen (PERCOCET/ROXICET) 5-325 MG per tablet 1 tablet (has no administration in time range)    ED Course/ Medical Decision Making/ A&P  Medical Decision Making Risk Prescription drug management.   Patient with probable tooth abscess.  She is given penicillin and Percocet and will follow-up with a dentist        Final Clinical Impression(s) / ED Diagnoses Final diagnoses:  Dental abscess    Rx / DC Orders ED Discharge Orders          Ordered    penicillin v potassium (VEETID) 500 MG tablet  4 times daily        07/20/23 0914    oxyCODONE-acetaminophen (PERCOCET/ROXICET) 5-325 MG tablet        07/20/23 8657              Bethann Berkshire, MD 07/24/23 1015

## 2023-07-20 NOTE — ED Triage Notes (Signed)
 Pt c/o pain and swelling to left side of face when she woke up this am; pt c/o "bad taste" in her mouth and c/o nausea

## 2023-10-16 ENCOUNTER — Other Ambulatory Visit: Payer: Self-pay

## 2023-10-16 ENCOUNTER — Emergency Department (HOSPITAL_COMMUNITY)
Admission: EM | Admit: 2023-10-16 | Discharge: 2023-10-16 | Disposition: A | Discharge status: Home / Self Care | Attending: Emergency Medicine | Admitting: Emergency Medicine

## 2023-10-16 ENCOUNTER — Encounter (HOSPITAL_COMMUNITY): Payer: Self-pay | Admitting: Emergency Medicine

## 2023-10-16 DIAGNOSIS — F1210 Cannabis abuse, uncomplicated: Secondary | ICD-10-CM | POA: Diagnosis not present

## 2023-10-16 DIAGNOSIS — R1084 Generalized abdominal pain: Secondary | ICD-10-CM | POA: Diagnosis not present

## 2023-10-16 DIAGNOSIS — R7401 Elevation of levels of liver transaminase levels: Secondary | ICD-10-CM | POA: Insufficient documentation

## 2023-10-16 DIAGNOSIS — R112 Nausea with vomiting, unspecified: Secondary | ICD-10-CM | POA: Insufficient documentation

## 2023-10-16 DIAGNOSIS — R197 Diarrhea, unspecified: Secondary | ICD-10-CM | POA: Diagnosis not present

## 2023-10-16 LAB — COMPREHENSIVE METABOLIC PANEL WITH GFR
ALT: 62 U/L — ABNORMAL HIGH (ref 0–44)
AST: 44 U/L — ABNORMAL HIGH (ref 15–41)
Albumin: 3.7 g/dL (ref 3.5–5.0)
Alkaline Phosphatase: 100 U/L (ref 38–126)
Anion gap: 11 (ref 5–15)
BUN: 8 mg/dL (ref 6–20)
CO2: 22 mmol/L (ref 22–32)
Calcium: 9.3 mg/dL (ref 8.9–10.3)
Chloride: 105 mmol/L (ref 98–111)
Creatinine, Ser: 0.64 mg/dL (ref 0.44–1.00)
GFR, Estimated: >60 mL/min (ref >60)
Glucose, Bld: 121 mg/dL — ABNORMAL HIGH (ref 70–99)
Potassium: 3.9 mmol/L (ref 3.5–5.1)
Sodium: 138 mmol/L (ref 135–145)
Total Bilirubin: 0.5 mg/dL (ref 0.0–1.2)
Total Protein: 6.9 g/dL (ref 6.5–8.1)

## 2023-10-16 LAB — RAPID URINE DRUG SCREEN, HOSP PERFORMED
Amphetamines: NOT DETECTED
Barbiturates: NOT DETECTED
Benzodiazepines: NOT DETECTED
Cocaine: NOT DETECTED
Opiates: NOT DETECTED
Tetrahydrocannabinol: POSITIVE — AB

## 2023-10-16 LAB — LIPASE, BLOOD: Lipase: 26 U/L (ref 11–51)

## 2023-10-16 LAB — CBC
HCT: 39.7 % (ref 36.0–46.0)
Hemoglobin: 13.4 g/dL (ref 12.0–15.0)
MCH: 30.9 pg (ref 26.0–34.0)
MCHC: 33.8 g/dL (ref 30.0–36.0)
MCV: 91.7 fL (ref 80.0–100.0)
Platelets: 350 10*3/uL (ref 150–400)
RBC: 4.33 MIL/uL (ref 3.87–5.11)
RDW: 13.8 % (ref 11.5–15.5)
WBC: 8.9 10*3/uL (ref 4.0–10.5)
nRBC: 0 % (ref 0.0–0.2)

## 2023-10-16 LAB — URINALYSIS, ROUTINE W REFLEX MICROSCOPIC
Bilirubin Urine: NEGATIVE
Glucose, UA: 50 mg/dL — AB
Hgb urine dipstick: NEGATIVE
Ketones, ur: NEGATIVE mg/dL
Leukocytes,Ua: NEGATIVE
Nitrite: NEGATIVE
Protein, ur: NEGATIVE mg/dL
Specific Gravity, Urine: 1.017 (ref 1.005–1.030)
pH: 5 (ref 5.0–8.0)

## 2023-10-16 LAB — POC URINE PREG, ED: Preg Test, Ur: NEGATIVE

## 2023-10-16 MED ORDER — DICYCLOMINE HCL 10 MG/ML IM SOLN
20.0000 mg | Freq: Once | INTRAMUSCULAR | Status: AC
  Administered 2023-10-16: 20 mg via INTRAMUSCULAR
  Filled 2023-10-16: qty 2

## 2023-10-16 MED ORDER — DICYCLOMINE HCL 20 MG PO TABS: 20.0000 mg | ORAL_TABLET | Freq: Two times a day (BID) | ORAL | 0 refills | Status: DC

## 2023-10-16 MED ORDER — DROPERIDOL 2.5 MG/ML IJ SOLN
1.2500 mg | Freq: Once | INTRAMUSCULAR | Status: AC
  Administered 2023-10-16: 1.25 mg via INTRAVENOUS
  Filled 2023-10-16: qty 2

## 2023-10-16 NOTE — Discharge Instructions (Addendum)
 Today you were seen for abdominal pain with nausea, vomiting, and diarrhea.  Please return to the ED if you have uncontrollable vomiting, or severe abdominal pain.  Please pick up your Bentyl  and take as prescribed.  Thank you for letting us  treat you today. After reviewing your labs, I feel you are safe to go home. Please follow up with your PCP in the next several days and provide them with your records from this visit. Return to the Emergency Room if pain becomes severe or symptoms worsen.

## 2023-10-16 NOTE — ED Provider Notes (Signed)
 [place] EMERGENCY DEPARTMENT AT [facility] Provider Note   CSN: [id number] Arrival date & time: 10/16/23  2012     Patient presents with: Abdominal Pain   [name] is a 38 y.o. female patient with a past medical history significant for chronic pancreatitis, gastritis, chronic abdominal pain, and GERD presents today for abdominal pain rating from the left upper quadrant/epigastric area to her back.  Patient also reports diarrhea.    Abdominal Pain      Prior to Admission medications   Medication Sig Start Date End Date Taking? Authorizing Provider  dicyclomine  (BENTYL ) 20 MG tablet Take 1 tablet (20 mg total) by mouth 2 (two) times daily. 10/16/23  Yes Akeia Perot N, PA-C  acetaminophen  (TYLENOL ) 500 MG tablet Take 1,000 mg by mouth every 6 (six) hours as needed for mild pain or moderate pain.    [provider]  albuterol  (PROVENTIL ) (2.5 MG/3ML) 0.083% nebulizer solution Take 3 mLs (2.5 mg total) by nebulization every 6 (six) hours as needed for wheezing or shortness of breath. 03/02/23   Elnor Jayson LABOR, DO  albuterol  (VENTOLIN  HFA) 108 (90 Base) MCG/ACT inhaler Inhale 1-2 puffs into the lungs every 4 (four) hours as needed for wheezing or shortness of breath. 03/02/23   Elnor Jayson LABOR, DO  amoxicillin  (AMOXIL ) 500 MG capsule Take 500 mg by mouth 3 (three) times daily. Patient not taking: Reported on 03/02/2023    [provider]  amoxicillin -clavulanate (AUGMENTIN ) 875-125 MG tablet Take 1 tablet by mouth every 12 (twelve) hours. 06/04/23   Suellen Cantor A, PA-C  azithromycin  (ZITHROMAX ) 250 MG tablet Take 1 tablet (250 mg total) by mouth daily. Take first 2 tablets together, then 1 every day until finished. 03/02/23   Elnor Jayson LABOR, DO  chlorhexidine  (PERIDEX ) 0.12 % solution Use as directed 15 mLs in the mouth or throat 2 (two) times daily. 06/05/23   Suellen Cantor A, PA-C  diclofenac Sodium (VOLTAREN) 1 % GEL Apply 2 g topically daily as  needed (pain).    [provider]  guaiFENesin -dextromethorphan  (ROBITUSSIN DM) 100-10 MG/5ML syrup Take 5 mLs by mouth every 4 (four) hours as needed for cough. 03/02/23   Elnor Jayson LABOR, DO  lipase/protease/amylase (CREON ) 36000 UNITS CPEP capsule Take 2 capsules (72,000 Units total) by mouth 3 (three) times daily with meals AND 1 capsule (36,000 Units total) with snacks. Patient not taking: Reported on 03/02/2023 08/16/22   Rudy Josette RAMAN, PA-C  metoprolol  tartrate (LOPRESSOR ) 25 MG tablet Take 1 tablet (25 mg total) by mouth 2 (two) times daily. 10/05/22 01/03/23  Mallipeddi, Vishnu P, MD  ondansetron  (ZOFRAN ) 4 MG tablet Take 1 tablet (4 mg total) by mouth every 6 (six) hours. 05/02/23   Yolande Lamar JAYSON, MD  Oxycodone  HCl 10 MG TABS Take 10 mg by mouth every 8 (eight) hours. 02/23/23   [provider]  oxyCODONE -acetaminophen  (PERCOCET/ROXICET) 5-325 MG tablet Take one pill every 6 hours for pain not relieved by tylenol  or motrin  alone 07/20/23   Zammit, Joseph, MD  pantoprazole  (PROTONIX ) 40 MG tablet Take 40 mg by mouth every evening. 08/19/17   [provider]  promethazine  (PHENERGAN ) 25 MG tablet Take 25 mg by mouth every 4 (four) hours as needed for vomiting or nausea. 12/15/21   [provider]  propranolol  (INDERAL ) 10 MG tablet Take 1 tablet (10 mg total) by mouth 3 (three) times daily. 11/19/22   Mallipeddi, Vishnu P, MD  sucralfate  (CARAFATE ) 1 g tablet  Take 1 tablet (1 g total) by mouth 4 (four) times daily as needed. Patient not taking: Reported on 03/02/2023 02/15/23   Theadore Ozell HERO, MD    Allergies: Bee venom, Other, Fentanyl , Morphine , Toradol  [ketorolac  tromethamine ], and Reglan  [metoclopramide ]    Review of Systems  Gastrointestinal:  Positive for abdominal pain.    Updated Vital Signs BP 110/78   Pulse 73   Temp 98 F (36.7 C) (Oral)   Resp 18   Ht 5' 4 (1.626 m)   Wt 56.7 kg   LMP 10/02/2023 (Approximate)   SpO2 96%   BMI 21.46  kg/m   Physical Exam Vitals and nursing note reviewed.  Constitutional:      General: She is not in acute distress.    Appearance: She is well-developed. She is not ill-appearing or diaphoretic.  HENT:     Head: Normocephalic and atraumatic.     Mouth/Throat:     Mouth: Mucous membranes are moist.     Pharynx: Oropharynx is clear.   Eyes:     Extraocular Movements: Extraocular movements intact.     Conjunctiva/sclera: Conjunctivae normal.     Pupils: Pupils are equal, round, and reactive to light.    Cardiovascular:     Rate and Rhythm: Normal rate and regular rhythm.     Heart sounds: Normal heart sounds. No murmur heard. Pulmonary:     Effort: Pulmonary effort is normal. No respiratory distress.     Breath sounds: Normal breath sounds.  Abdominal:     General: Abdomen is flat. There is no distension.     Palpations: Abdomen is soft.     Tenderness: There is abdominal tenderness in the epigastric area.   Musculoskeletal:        General: No swelling.     Cervical back: Neck supple.   Skin:    General: Skin is warm and dry.     Capillary Refill: Capillary refill takes less than 2 seconds.   Neurological:     Mental Status: She is alert.   Psychiatric:        Mood and Affect: Mood normal.     (all labs ordered are listed, but only abnormal results are displayed) Labs Reviewed  COMPREHENSIVE METABOLIC PANEL WITH GFR - Abnormal; Notable for the following components:      Result Value   Glucose, Bld 121 (*)    AST 44 (*)    ALT 62 (*)    All other components within normal limits  URINALYSIS, ROUTINE W REFLEX MICROSCOPIC - Abnormal; Notable for the following components:   Glucose, UA 50 (*)    All other components within normal limits  RAPID URINE DRUG SCREEN, HOSP PERFORMED - Abnormal; Notable for the following components:   Tetrahydrocannabinol POSITIVE (*)    All other components within normal limits  LIPASE, BLOOD  CBC  POC URINE PREG, ED     EKG: None  Radiology: No results found.   Procedures   Medications Ordered in the ED  droperidol  (INAPSINE ) 2.5 MG/ML injection 1.25 mg (1.25 mg Intravenous Given 10/16/23 2219)  dicyclomine  (BENTYL ) injection 20 mg (20 mg Intramuscular Given 10/16/23 2247)    Clinical Course as of 10/16/23 2309  Sun Oct 16, 2023  2203 He is here with a complaint of abdominal pain through to her back similar to her prior pancreatitis flares.  Associated with vomiting and diarrhea.  Follows with [facility].  States her pain is severe.  Has a soft abdomen mildly tender.  Will likely need some pain medicine PPI antiemetic. [MB]    Clinical Course User Index [MB] Towana Ozell BROCKS, MD                                 Medical Decision Making Amount and/or Complexity of Data Reviewed Labs: ordered.   This patient presents to the ED for concern of abdominal pain differential diagnosis includes pancreatitis, choledocholithiasis, appendicitis, acute cholecystitis, viral GI illness, diverticulitis, ulcerative colitis  Additional history obtained   Additional history obtained from Electronic Medical Record External records from outside source obtained and reviewed including Care Everywhere   Lab Tests:  I Ordered, and personally interpreted labs.  The pertinent results include: Negative pregnancy, UA with 50 glucose, CBC WNL, mildly elevated AST of 44 and ALT at 62, UDS positive for THC   Medicines ordered and prescription drug management:  I ordered medication including droperidol  and Bentyl  I have reviewed the patients home medicines and have made adjustments as needed   Problem List / ED Course:  Patient requesting discharge at this time, states she is feeling better after droperidol  and Bentyl . Considered for admission further workup however patient's vital signs, physical exam, and labs are reassuring.  Patient's symptoms likely due to chronic abdominal pain.  Patient given  outpatient prescription for Bentyl  for abdominal pain as needed.  Patient given return precautions.  I feel patient safe for discharge at this time.      Final diagnoses:  Generalized abdominal pain  Nausea vomiting and diarrhea    ED Discharge Orders          Ordered    dicyclomine  (BENTYL ) 20 MG tablet  2 times daily        10/16/23 2307               Francis Ileana SAILOR, PA-C 10/16/23 2309    Towana Ozell BROCKS, MD 10/17/23 [phone]

## 2023-10-16 NOTE — ED Notes (Signed)
 Pt c/o 10/10 pain. PA informed. Kellogg RN

## 2023-10-16 NOTE — ED Notes (Signed)
 Family has been at desk 3 times requesting pain meds for patient. Kellogg RN

## 2023-10-16 NOTE — ED Triage Notes (Signed)
 Pt c/o severe abdominal pain radiating from LUQ/epigastric area to similar position in her back; also reports multiple episodes of diarrhea. Hx chronic pancreatitis and believes this is the same. Current symptoms started this morning. Pain rated 9/10 intermittent.

## 2023-10-27 ENCOUNTER — Emergency Department (HOSPITAL_COMMUNITY)

## 2023-10-27 ENCOUNTER — Encounter (HOSPITAL_COMMUNITY): Payer: Self-pay

## 2023-10-27 ENCOUNTER — Other Ambulatory Visit: Payer: Self-pay

## 2023-10-27 ENCOUNTER — Inpatient Hospital Stay (HOSPITAL_COMMUNITY)
Admission: EM | Admit: 2023-10-27 | Discharge: 2023-10-29 | DRG: 205 | Disposition: A | Discharge status: Home / Self Care | Attending: Internal Medicine | Admitting: Internal Medicine

## 2023-10-27 DIAGNOSIS — E861 Hypovolemia: Secondary | ICD-10-CM | POA: Diagnosis present

## 2023-10-27 DIAGNOSIS — R000 Tachycardia, unspecified: Secondary | ICD-10-CM | POA: Diagnosis present

## 2023-10-27 DIAGNOSIS — J4521 Mild intermittent asthma with (acute) exacerbation: Secondary | ICD-10-CM | POA: Diagnosis present

## 2023-10-27 DIAGNOSIS — Z720 Tobacco use: Secondary | ICD-10-CM | POA: Diagnosis present

## 2023-10-27 DIAGNOSIS — Z885 Allergy status to narcotic agent status: Secondary | ICD-10-CM

## 2023-10-27 DIAGNOSIS — E871 Hypo-osmolality and hyponatremia: Secondary | ICD-10-CM | POA: Diagnosis not present

## 2023-10-27 DIAGNOSIS — G894 Chronic pain syndrome: Secondary | ICD-10-CM | POA: Diagnosis present

## 2023-10-27 DIAGNOSIS — K861 Other chronic pancreatitis: Secondary | ICD-10-CM | POA: Diagnosis present

## 2023-10-27 DIAGNOSIS — Z818 Family history of other mental and behavioral disorders: Secondary | ICD-10-CM

## 2023-10-27 DIAGNOSIS — J45901 Unspecified asthma with (acute) exacerbation: Principal | ICD-10-CM | POA: Diagnosis present

## 2023-10-27 DIAGNOSIS — Z1152 Encounter for screening for COVID-19: Secondary | ICD-10-CM

## 2023-10-27 DIAGNOSIS — D72829 Elevated white blood cell count, unspecified: Secondary | ICD-10-CM | POA: Diagnosis present

## 2023-10-27 DIAGNOSIS — J9601 Acute respiratory failure with hypoxia: Secondary | ICD-10-CM | POA: Diagnosis present

## 2023-10-27 DIAGNOSIS — Z888 Allergy status to other drugs, medicaments and biological substances status: Secondary | ICD-10-CM

## 2023-10-27 DIAGNOSIS — Z91030 Bee allergy status: Secondary | ICD-10-CM

## 2023-10-27 DIAGNOSIS — Z79899 Other long term (current) drug therapy: Secondary | ICD-10-CM

## 2023-10-27 DIAGNOSIS — Z8249 Family history of ischemic heart disease and other diseases of the circulatory system: Secondary | ICD-10-CM

## 2023-10-27 DIAGNOSIS — Z716 Tobacco abuse counseling: Secondary | ICD-10-CM

## 2023-10-27 DIAGNOSIS — Z91148 Patient's other noncompliance with medication regimen for other reason: Secondary | ICD-10-CM

## 2023-10-27 DIAGNOSIS — I10 Essential (primary) hypertension: Secondary | ICD-10-CM | POA: Diagnosis present

## 2023-10-27 DIAGNOSIS — Z825 Family history of asthma and other chronic lower respiratory diseases: Secondary | ICD-10-CM

## 2023-10-27 DIAGNOSIS — Y92009 Unspecified place in unspecified non-institutional (private) residence as the place of occurrence of the external cause: Secondary | ICD-10-CM

## 2023-10-27 DIAGNOSIS — K219 Gastro-esophageal reflux disease without esophagitis: Secondary | ICD-10-CM | POA: Diagnosis present

## 2023-10-27 DIAGNOSIS — Z833 Family history of diabetes mellitus: Secondary | ICD-10-CM

## 2023-10-27 DIAGNOSIS — J95859 Other complication of respirator [ventilator]: Principal | ICD-10-CM | POA: Diagnosis present

## 2023-10-27 DIAGNOSIS — F17210 Nicotine dependence, cigarettes, uncomplicated: Secondary | ICD-10-CM | POA: Diagnosis present

## 2023-10-27 LAB — CBC WITH DIFFERENTIAL/PLATELET
Abs Immature Granulocytes: 0.04 K/uL (ref 0.00–0.07)
Basophils Absolute: 0.1 K/uL (ref 0.0–0.1)
Basophils Relative: 1 %
Eosinophils Absolute: 1.5 K/uL — ABNORMAL HIGH (ref 0.0–0.5)
Eosinophils Relative: 14 %
HCT: 39.2 % (ref 36.0–46.0)
Hemoglobin: 13.5 g/dL (ref 12.0–15.0)
Immature Granulocytes: 0 %
Lymphocytes Relative: 14 %
Lymphs Abs: 1.6 K/uL (ref 0.7–4.0)
MCH: 31.3 pg (ref 26.0–34.0)
MCHC: 34.4 g/dL (ref 30.0–36.0)
MCV: 91 fL (ref 80.0–100.0)
Monocytes Absolute: 0.7 K/uL (ref 0.1–1.0)
Monocytes Relative: 6 %
Neutro Abs: 7.1 K/uL (ref 1.7–7.7)
Neutrophils Relative %: 65 %
Platelets: 316 K/uL (ref 150–400)
RBC: 4.31 MIL/uL (ref 3.87–5.11)
RDW: 13.5 % (ref 11.5–15.5)
WBC: 11 K/uL — ABNORMAL HIGH (ref 4.0–10.5)
nRBC: 0 % (ref 0.0–0.2)

## 2023-10-27 LAB — TROPONIN I (HIGH SENSITIVITY): Troponin I (High Sensitivity): <2 ng/L (ref <18)

## 2023-10-27 LAB — RESP PANEL BY RT-PCR (RSV, FLU A&B, COVID)  RVPGX2
Influenza A by PCR: NEGATIVE
Influenza B by PCR: NEGATIVE
Resp Syncytial Virus by PCR: NEGATIVE
SARS Coronavirus 2 by RT PCR: NEGATIVE

## 2023-10-27 LAB — BASIC METABOLIC PANEL WITH GFR
Anion gap: 7 (ref 5–15)
BUN: 9 mg/dL (ref 6–20)
CO2: 20 mmol/L — ABNORMAL LOW (ref 22–32)
Calcium: 9.1 mg/dL (ref 8.9–10.3)
Chloride: 106 mmol/L (ref 98–111)
Creatinine, Ser: 0.79 mg/dL (ref 0.44–1.00)
GFR, Estimated: >60 mL/min (ref >60)
Glucose, Bld: 157 mg/dL — ABNORMAL HIGH (ref 70–99)
Potassium: 4.8 mmol/L (ref 3.5–5.1)
Sodium: 133 mmol/L — ABNORMAL LOW (ref 135–145)

## 2023-10-27 LAB — HCG, SERUM, QUALITATIVE: Preg, Serum: NEGATIVE

## 2023-10-27 LAB — HIV ANTIBODY (ROUTINE TESTING W REFLEX): HIV Screen 4th Generation wRfx: NONREACTIVE

## 2023-10-27 LAB — PROCALCITONIN: Procalcitonin: <0.1 ng/mL

## 2023-10-27 MED ORDER — ENOXAPARIN SODIUM 40 MG/0.4ML IJ SOSY
40.0000 mg | PREFILLED_SYRINGE | INTRAMUSCULAR | Status: DC
  Filled 2023-10-27: qty 0.4

## 2023-10-27 MED ORDER — METHYLPREDNISOLONE SODIUM SUCC 125 MG IJ SOLR
125.0000 mg | Freq: Once | INTRAMUSCULAR | Status: AC
Start: 2023-10-27 — End: 2023-10-27
  Administered 2023-10-27: 125 mg via INTRAVENOUS
  Filled 2023-10-27: qty 2

## 2023-10-27 MED ORDER — MAGNESIUM SULFATE 2 GM/50ML IV SOLN
2.0000 g | Freq: Once | INTRAVENOUS | Status: AC
Start: 2023-10-27 — End: 2023-10-27
  Administered 2023-10-27: 2 g via INTRAVENOUS
  Filled 2023-10-27: qty 50

## 2023-10-27 MED ORDER — ACETAMINOPHEN 325 MG PO TABS: 650.0000 mg | ORAL_TABLET | Freq: Four times a day (QID) | ORAL | Status: DC | PRN

## 2023-10-27 MED ORDER — PREDNISONE 20 MG PO TABS
40.0000 mg | ORAL_TABLET | Freq: Every day | ORAL | Status: DC
  Administered 2023-10-28: 40 mg via ORAL
  Filled 2023-10-27: qty 2

## 2023-10-27 MED ORDER — PANTOPRAZOLE SODIUM 40 MG PO TBEC
40.0000 mg | DELAYED_RELEASE_TABLET | Freq: Every evening | ORAL | Status: DC
  Administered 2023-10-27 – 2023-10-28 (×2): 40 mg via ORAL
  Filled 2023-10-27 (×2): qty 1

## 2023-10-27 MED ORDER — ALBUTEROL SULFATE (2.5 MG/3ML) 0.083% IN NEBU
2.5000 mg | INHALATION_SOLUTION | Freq: Four times a day (QID) | RESPIRATORY_TRACT | Status: DC
  Administered 2023-10-27 – 2023-10-28 (×5): 2.5 mg via RESPIRATORY_TRACT
  Filled 2023-10-27 (×6): qty 3

## 2023-10-27 MED ORDER — ACETAMINOPHEN 650 MG RE SUPP: 650.0000 mg | Freq: Four times a day (QID) | RECTAL | Status: DC | PRN

## 2023-10-27 MED ORDER — PROMETHAZINE HCL 12.5 MG PO TABS
25.0000 mg | ORAL_TABLET | ORAL | Status: DC | PRN
  Administered 2023-10-27 – 2023-10-29 (×8): 25 mg via ORAL
  Filled 2023-10-27 (×8): qty 2

## 2023-10-27 MED ORDER — OXYCODONE HCL 5 MG PO TABS
10.0000 mg | ORAL_TABLET | Freq: Four times a day (QID) | ORAL | Status: DC | PRN
  Administered 2023-10-27 – 2023-10-29 (×8): 10 mg via ORAL
  Filled 2023-10-27 (×8): qty 2

## 2023-10-27 MED ORDER — IPRATROPIUM-ALBUTEROL 0.5-2.5 (3) MG/3ML IN SOLN
3.0000 mL | RESPIRATORY_TRACT | Status: DC
  Filled 2023-10-27: qty 3

## 2023-10-27 MED ORDER — ALBUTEROL SULFATE (2.5 MG/3ML) 0.083% IN NEBU
2.5000 mg | INHALATION_SOLUTION | RESPIRATORY_TRACT | Status: DC | PRN
  Administered 2023-10-28 (×2): 2.5 mg via RESPIRATORY_TRACT
  Filled 2023-10-27 (×2): qty 3

## 2023-10-27 MED ORDER — GUAIFENESIN ER 600 MG PO TB12
600.0000 mg | ORAL_TABLET | Freq: Two times a day (BID) | ORAL | Status: DC
  Administered 2023-10-27 – 2023-10-29 (×4): 600 mg via ORAL
  Filled 2023-10-27 (×6): qty 1

## 2023-10-27 MED ORDER — NICOTINE 21 MG/24HR TD PT24
21.0000 mg | MEDICATED_PATCH | Freq: Every day | TRANSDERMAL | Status: DC
  Administered 2023-10-27 – 2023-10-29 (×3): 21 mg via TRANSDERMAL
  Filled 2023-10-27 (×3): qty 1

## 2023-10-27 MED ORDER — SODIUM CHLORIDE 0.9% FLUSH
3.0000 mL | Freq: Two times a day (BID) | INTRAVENOUS | Status: DC
  Administered 2023-10-27 – 2023-10-29 (×5): 3 mL via INTRAVENOUS

## 2023-10-27 MED ORDER — ALBUTEROL SULFATE (2.5 MG/3ML) 0.083% IN NEBU
10.0000 mg/h | INHALATION_SOLUTION | RESPIRATORY_TRACT | Status: AC
  Administered 2023-10-27: 10 mg/h via RESPIRATORY_TRACT
  Filled 2023-10-27: qty 3

## 2023-10-27 NOTE — ED Provider Notes (Addendum)
 [place] EMERGENCY DEPARTMENT AT [facility] Provider Note   CSN: [id number] Arrival date & time: 10/27/23  1155     Patient presents with: Shortness of Breath   [name] is a 38 y.o. female w/ hx of asthma presenting to ED with a few days of worsening cough, chest tightness, SOB.  Went to PCP today concerned for hypoxia in office, 90% on room air, given duoneb and sent to ED. Pt reports using nebulizers at home with no relief.     HPI     Prior to Admission medications   Medication Sig Start Date End Date Taking? Authorizing Provider  acetaminophen  (TYLENOL ) 500 MG tablet Take 1,000 mg by mouth every 6 (six) hours as needed for mild pain or moderate pain.    [provider]  albuterol  (PROVENTIL ) (2.5 MG/3ML) 0.083% nebulizer solution Take 3 mLs (2.5 mg total) by nebulization every 6 (six) hours as needed for wheezing or shortness of breath. 03/02/23   Elnor Jayson LABOR, DO  albuterol  (VENTOLIN  HFA) 108 (90 Base) MCG/ACT inhaler Inhale 1-2 puffs into the lungs every 4 (four) hours as needed for wheezing or shortness of breath. 03/02/23   Elnor Jayson LABOR, DO  amoxicillin  (AMOXIL ) 500 MG capsule Take 500 mg by mouth 3 (three) times daily. Patient not taking: Reported on 03/02/2023    [provider]  amoxicillin -clavulanate (AUGMENTIN ) 875-125 MG tablet Take 1 tablet by mouth every 12 (twelve) hours. 06/04/23   Suellen Cantor A, PA-C  azithromycin  (ZITHROMAX ) 250 MG tablet Take 1 tablet (250 mg total) by mouth daily. Take first 2 tablets together, then 1 every day until finished. 03/02/23   Elnor Jayson LABOR, DO  chlorhexidine  (PERIDEX ) 0.12 % solution Use as directed 15 mLs in the mouth or throat 2 (two) times daily. 06/05/23   Suellen Cantor A, PA-C  diclofenac Sodium (VOLTAREN) 1 % GEL Apply 2 g topically daily as needed (pain).    [provider]  dicyclomine  (BENTYL ) 20 MG tablet Take 1 tablet (20 mg total) by mouth 2 (two) times daily. 10/16/23    Keith, Kayla N, PA-C  guaiFENesin -dextromethorphan  (ROBITUSSIN DM) 100-10 MG/5ML syrup Take 5 mLs by mouth every 4 (four) hours as needed for cough. 03/02/23   Elnor Jayson LABOR, DO  lipase/protease/amylase (CREON ) 36000 UNITS CPEP capsule Take 2 capsules (72,000 Units total) by mouth 3 (three) times daily with meals AND 1 capsule (36,000 Units total) with snacks. Patient not taking: Reported on 03/02/2023 08/16/22   Rudy Josette RAMAN, PA-C  metoprolol  tartrate (LOPRESSOR ) 25 MG tablet Take 1 tablet (25 mg total) by mouth 2 (two) times daily. 10/05/22 01/03/23  Mallipeddi, Vishnu P, MD  ondansetron  (ZOFRAN ) 4 MG tablet Take 1 tablet (4 mg total) by mouth every 6 (six) hours. 05/02/23   Yolande Lamar JAYSON, MD  Oxycodone  HCl 10 MG TABS Take 10 mg by mouth every 8 (eight) hours. 02/23/23   [provider]  oxyCODONE -acetaminophen  (PERCOCET/ROXICET) 5-325 MG tablet Take one pill every 6 hours for pain not relieved by tylenol  or motrin  alone 07/20/23   Zammit, Joseph, MD  pantoprazole  (PROTONIX ) 40 MG tablet Take 40 mg by mouth every evening. 08/19/17   [provider]  promethazine  (PHENERGAN ) 25 MG tablet Take 25 mg by mouth every 4 (four) hours as needed for vomiting or nausea. 12/15/21   [provider]  propranolol  (INDERAL ) 10 MG tablet Take 1 tablet (10 mg total) by mouth 3 (three) times daily. 11/19/22  Mallipeddi, Vishnu P, MD  sucralfate  (CARAFATE ) 1 g tablet Take 1 tablet (1 g total) by mouth 4 (four) times daily as needed. Patient not taking: Reported on 03/02/2023 02/15/23   Theadore Ozell HERO, MD    Allergies: Bee venom, Other, Fentanyl , Morphine , Toradol  [ketorolac  tromethamine ], and Reglan  [metoclopramide ]    Review of Systems  Updated Vital Signs BP 101/80 (BP Location: Right Arm)   Pulse 61   Temp 98 F (36.7 C) (Oral)   Resp 20   Ht 5' 4 (1.626 m)   Wt 53.1 kg   LMP 10/02/2023 (Approximate)   SpO2 100%   BMI 20.08 kg/m   Physical Exam Constitutional:       General: She is not in acute distress. HENT:     Head: Normocephalic and atraumatic.  Eyes:     Conjunctiva/sclera: Conjunctivae normal.     Pupils: Pupils are equal, round, and reactive to light.  Cardiovascular:     Rate and Rhythm: Regular rhythm. Tachycardia present.  Pulmonary:     Effort: Pulmonary effort is normal. No respiratory distress.     Comments: Persistent coughing, expiratory wheezing Abdominal:     General: There is no distension.     Tenderness: There is no abdominal tenderness.  Skin:    General: Skin is warm and dry.  Neurological:     General: No focal deficit present.     Mental Status: She is alert. Mental status is at baseline.  Psychiatric:        Mood and Affect: Mood normal.        Behavior: Behavior normal.     (all labs ordered are listed, but only abnormal results are displayed) Labs Reviewed  BASIC METABOLIC PANEL WITH GFR - Abnormal; Notable for the following components:      Result Value   Sodium 133 (*)    CO2 20 (*)    Glucose, Bld 157 (*)    All other components within normal limits  CBC WITH DIFFERENTIAL/PLATELET - Abnormal; Notable for the following components:   WBC 11.0 (*)    Eosinophils Absolute 1.5 (*)    All other components within normal limits  RESP PANEL BY RT-PCR (RSV, FLU A&B, COVID)  RVPGX2  HCG, SERUM, QUALITATIVE  TROPONIN I (HIGH SENSITIVITY)    EKG: EKG Interpretation Date/Time:  Thursday October 27 2023 12:13:28 EDT Ventricular Rate:  104 PR Interval:  142 QRS Duration:  81 QT Interval:  330 QTC Calculation: 434 R Axis:   90  Text Interpretation: Sinus tachycardia Biatrial enlargement Borderline right axis deviation Confirmed by Cottie Cough [phone]) on 10/27/2023 12:17:22 PM  Radiology: [facility] Chest Port 1 View Result Date: 10/27/2023 CLINICAL DATA:  Shortness of breath. Productive cough. Former smoker. EXAM: PORTABLE CHEST - 1 VIEW COMPARISON:  03/02/2023 FINDINGS: Cardiomediastinal silhouette and pulmonary  vasculature are within normal limits. Lungs are clear. IMPRESSION: No acute cardiopulmonary process. Electronically Signed   By: Aliene Lloyd M.D.   On: 10/27/2023 12:41     .Critical Care  Performed by: Cottie Cough PARAS, MD Authorized by: Cottie Cough PARAS, MD   Critical care provider statement:    Critical care time (minutes):  30   Critical care time was exclusive of:  Separately billable procedures and treating other patients   Critical care was necessary to treat or prevent imminent or life-threatening deterioration of the following conditions:  Respiratory failure   Critical care was time spent personally by me on the following activities:  Ordering and performing  treatments and interventions, ordering and review of laboratory studies, ordering and review of radiographic studies, pulse oximetry, review of old charts, examination of patient and evaluation of patient's response to treatment   Care discussed with: admitting provider      Medications Ordered in the ED  albuterol  (PROVENTIL ) (2.5 MG/3ML) 0.083% nebulizer solution (0 mg/hr Nebulization Stopped 10/27/23 1314)  ipratropium-albuterol  (DUONEB) 0.5-2.5 (3) MG/3ML nebulizer solution 3 mL (has no administration in time range)  methylPREDNISolone  sodium succinate (SOLU-MEDROL ) 125 mg/2 mL injection 125 mg (125 mg Intravenous Given 10/27/23 1226)  magnesium  sulfate IVPB 2 g 50 mL (2 g Intravenous New Bag/Given 10/27/23 1253)    Clinical Course as of 10/27/23 1442  Thu Oct 27, 2023  1431 Still wheezing and coughing on reassessment.  No hypoxia.  Plan for admission for suspected asthma exacerbation. [MT]  1442 Admitted to Dr Claudene hospitalist [MT]    Clinical Course User Index [MT] Cottie Donnice PARAS, MD                                 Medical Decision Making Amount and/or Complexity of Data Reviewed Labs: ordered. Radiology: ordered. ECG/medicine tests: ordered.  Risk Prescription drug management. Decision regarding  hospitalization.   This patient presents to the ED with concern for SOB, hypoxia. This involves an extensive number of treatment options, and is a complaint that carries with it a high risk of complications and morbidity.  The differential diagnosis includes asthma exacerbation vs PNA vs viral URI vs pleural effusion vs other  Co-morbidities that complicate the patient evaluation: hx of asthma  Additional history obtained from EMS  External records from outside source obtained and reviewed including PCP office record today  I ordered and personally interpreted labs.  The pertinent results include:  no emergent findings.  Trop < 2.  Covid flu negative.  WBC 11.0 but pt received steroids IM in PCP office.  I ordered imaging studies including dg chest I independently visualized and interpreted imaging which showed no evident infiltrates I agree with the radiologist interpretation  The patient was maintained on a cardiac monitor.  I personally viewed and interpreted the cardiac monitored which showed an underlying rhythm of: NSR  Per my interpretation the patient's ECG shows NSR with no acute ischemic findings  I ordered medication including IV steroids, IV magnesium  and continuous nebulizer for asthma/wheezing  I have reviewed the patients home medicines and have made adjustments as needed  Test Considered: lower suspicion for acute PE, ACS  After the interventions noted above, I reevaluated the patient and found that they have: improved - subjectively improved, but still wheezing and coughing, not stable for discharge  Disposition:  After consideration of the diagnostic results and the patient's response to treatment, I feel that the patent would benefit from medical admission      Final diagnoses:  Exacerbation of asthma, unspecified asthma severity, unspecified whether persistent    ED Discharge Orders     None          Spencer Peterkin, Donnice PARAS, MD 10/27/23 1432     Cottie Donnice PARAS, MD 10/27/23 1442
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
## 2023-10-27 NOTE — ED Triage Notes (Signed)
 Pt BIB [facility] from doctor's appointment for [facility]. Pt has hx asthma. Pt reports SHOB x3 days, has been doing home albuterol  nebs. Per EMS rhonci and wheezes bilaterally. Doctor's office gave 10mg  Dexamethasone  and duoneb. Pt O2 sat 90% on EMS arrive, post-treatments pt O2 WNL. Per EMS pt tachy in 100s and all other VSS.

## 2023-10-28 DIAGNOSIS — K861 Other chronic pancreatitis: Secondary | ICD-10-CM | POA: Diagnosis present

## 2023-10-28 DIAGNOSIS — J45909 Unspecified asthma, uncomplicated: Secondary | ICD-10-CM | POA: Diagnosis present

## 2023-10-28 DIAGNOSIS — Z79899 Other long term (current) drug therapy: Secondary | ICD-10-CM | POA: Diagnosis not present

## 2023-10-28 DIAGNOSIS — J9601 Acute respiratory failure with hypoxia: Secondary | ICD-10-CM | POA: Diagnosis present

## 2023-10-28 DIAGNOSIS — R000 Tachycardia, unspecified: Secondary | ICD-10-CM | POA: Diagnosis present

## 2023-10-28 DIAGNOSIS — K219 Gastro-esophageal reflux disease without esophagitis: Secondary | ICD-10-CM | POA: Diagnosis present

## 2023-10-28 DIAGNOSIS — F17210 Nicotine dependence, cigarettes, uncomplicated: Secondary | ICD-10-CM | POA: Diagnosis present

## 2023-10-28 DIAGNOSIS — I10 Essential (primary) hypertension: Secondary | ICD-10-CM | POA: Diagnosis present

## 2023-10-28 DIAGNOSIS — J4521 Mild intermittent asthma with (acute) exacerbation: Secondary | ICD-10-CM | POA: Diagnosis present

## 2023-10-28 DIAGNOSIS — E861 Hypovolemia: Secondary | ICD-10-CM | POA: Diagnosis present

## 2023-10-28 DIAGNOSIS — J45901 Unspecified asthma with (acute) exacerbation: Secondary | ICD-10-CM | POA: Diagnosis not present

## 2023-10-28 DIAGNOSIS — Z716 Tobacco abuse counseling: Secondary | ICD-10-CM | POA: Diagnosis not present

## 2023-10-28 DIAGNOSIS — Z1152 Encounter for screening for COVID-19: Secondary | ICD-10-CM | POA: Diagnosis not present

## 2023-10-28 DIAGNOSIS — J95859 Other complication of respirator [ventilator]: Secondary | ICD-10-CM | POA: Diagnosis present

## 2023-10-28 DIAGNOSIS — Z885 Allergy status to narcotic agent status: Secondary | ICD-10-CM | POA: Diagnosis not present

## 2023-10-28 DIAGNOSIS — G894 Chronic pain syndrome: Secondary | ICD-10-CM | POA: Diagnosis present

## 2023-10-28 DIAGNOSIS — E871 Hypo-osmolality and hyponatremia: Secondary | ICD-10-CM | POA: Diagnosis present

## 2023-10-28 DIAGNOSIS — D72829 Elevated white blood cell count, unspecified: Secondary | ICD-10-CM | POA: Diagnosis present

## 2023-10-28 DIAGNOSIS — Z91148 Patient's other noncompliance with medication regimen for other reason: Secondary | ICD-10-CM | POA: Diagnosis not present

## 2023-10-28 DIAGNOSIS — Z818 Family history of other mental and behavioral disorders: Secondary | ICD-10-CM | POA: Diagnosis not present

## 2023-10-28 DIAGNOSIS — Z91030 Bee allergy status: Secondary | ICD-10-CM | POA: Diagnosis not present

## 2023-10-28 DIAGNOSIS — Z833 Family history of diabetes mellitus: Secondary | ICD-10-CM | POA: Diagnosis not present

## 2023-10-28 DIAGNOSIS — Y92009 Unspecified place in unspecified non-institutional (private) residence as the place of occurrence of the external cause: Secondary | ICD-10-CM | POA: Diagnosis not present

## 2023-10-28 DIAGNOSIS — Z825 Family history of asthma and other chronic lower respiratory diseases: Secondary | ICD-10-CM | POA: Diagnosis not present

## 2023-10-28 DIAGNOSIS — Z888 Allergy status to other drugs, medicaments and biological substances status: Secondary | ICD-10-CM | POA: Diagnosis not present

## 2023-10-28 DIAGNOSIS — Z8249 Family history of ischemic heart disease and other diseases of the circulatory system: Secondary | ICD-10-CM | POA: Diagnosis not present

## 2023-10-28 LAB — CBC
HCT: 42.3 % (ref 36.0–46.0)
Hemoglobin: 14.3 g/dL (ref 12.0–15.0)
MCH: 31.2 pg (ref 26.0–34.0)
MCHC: 33.8 g/dL (ref 30.0–36.0)
MCV: 92.4 fL (ref 80.0–100.0)
Platelets: 277 K/uL (ref 150–400)
RBC: 4.58 MIL/uL (ref 3.87–5.11)
RDW: 13.6 % (ref 11.5–15.5)
WBC: 14.9 K/uL — ABNORMAL HIGH (ref 4.0–10.5)
nRBC: 0 % (ref 0.0–0.2)

## 2023-10-28 LAB — BASIC METABOLIC PANEL WITH GFR
Anion gap: 12 (ref 5–15)
BUN: 10 mg/dL (ref 6–20)
CO2: 21 mmol/L — ABNORMAL LOW (ref 22–32)
Calcium: 9.6 mg/dL (ref 8.9–10.3)
Chloride: 103 mmol/L (ref 98–111)
Creatinine, Ser: 0.81 mg/dL (ref 0.44–1.00)
GFR, Estimated: >60 mL/min (ref >60)
Glucose, Bld: 161 mg/dL — ABNORMAL HIGH (ref 70–99)
Potassium: 4.6 mmol/L (ref 3.5–5.1)
Sodium: 136 mmol/L (ref 135–145)

## 2023-10-28 MED ORDER — MELATONIN 3 MG PO TABS
3.0000 mg | ORAL_TABLET | Freq: Every day | ORAL | Status: DC
  Administered 2023-10-28: 3 mg via ORAL
  Filled 2023-10-28: qty 1

## 2023-10-28 MED ORDER — BISACODYL 5 MG PO TBEC
5.0000 mg | DELAYED_RELEASE_TABLET | Freq: Every day | ORAL | Status: DC | PRN
  Administered 2023-10-28: 5 mg via ORAL
  Filled 2023-10-28: qty 1

## 2023-10-28 MED ORDER — GUAIFENESIN-DM 100-10 MG/5ML PO SYRP
5.0000 mL | ORAL_SOLUTION | ORAL | Status: DC | PRN
  Administered 2023-10-28 – 2023-10-29 (×2): 5 mL via ORAL
  Filled 2023-10-28 (×2): qty 10

## 2023-10-28 MED ORDER — BUDESONIDE 0.5 MG/2ML IN SUSP
0.5000 mg | Freq: Two times a day (BID) | RESPIRATORY_TRACT | Status: DC
  Administered 2023-10-28 – 2023-10-29 (×3): 0.5 mg via RESPIRATORY_TRACT
  Filled 2023-10-28 (×3): qty 2

## 2023-10-28 NOTE — Progress Notes (Signed)
 Mobility Specialist: Progress Note   10/28/23 1522  Mobility  Activity Ambulated independently in hallway  Level of Assistance Independent  Assistive Device None  Distance Ambulated (ft) 900 ft  Activity Response Tolerated well  Mobility Referral Yes  Mobility visit 1 Mobility  Mobility Specialist Start Time (ACUTE ONLY) 1445  Mobility Specialist Stop Time (ACUTE ONLY) 1453  Mobility Specialist Time Calculation (min) (ACUTE ONLY) 8 min    Pt received in bed, eager for mobility session. Ind throughout. Took 1x seated break d/t SOB after a coughing spell during ambulation. SpO2 87-88% on RA during coughing spell, but pt recovered to SpO2 89-92%. Also coughed up some sputum during ambulation. Returned to room without fault. Left in bed with all needs met, call bell in reach. Friend in room.   Ileana Lute Mobility Specialist Please contact via SecureChat or Rehab office at [phone]

## 2023-10-28 NOTE — Progress Notes (Signed)
 PROGRESS NOTE    [name]  FMW:[id number] DOB: April 28, 1985 DOA: 10/27/2023 PCP: [facility]   Brief Narrative:  [name] is a 38 y.o. female with medical history significant of pre-eclampsia, intermittent asthma, recurrent pancreatitis, and tobacco abuse presents with dyspnea and hypoxia.  Patient sent in from PCP office with sats in the low 80s at rest.  Patient indicates her nebulizer recently malfunctioned likely the cause of her current exacerbation, hospitalist called for admission.  Assessment & Plan:   Principal Problem:   Asthma, chronic, unspecified asthma severity, with acute exacerbation Active Problems:   Leukocytosis   Hyponatremia   Chronic pain syndrome   Tobacco abuse   GERD (gastroesophageal reflux disease)  Acute hypoxic respiratory failure  Asthma exacerbation, improving  - Secondary to medication nonadherence due to nebulizer malfunction -Currently on as needed albuterol  nebulizer and as needed inhaler -Continue IV/p.o. steroids, schedule inhaled steroids twice daily -Continue to screen for hypoxia, once resolved will likely discharge home on scheduled inhalers with close outpatient follow-up with pulmonology  Leukocytosis, likely reactive -In the setting of steroids and acute asthma exacerbation  -Chest x-ray without clear etiology for infection -Respiratory panel negative   Mild hypovolemic hyponatremia Resolved with p.o. intake   Chronic pain In the setting of chronic pancreatitis and right lower extremity injury  - Continue home oxycodone  as needed   Tobacco use/abuse -Patient interested and smoking cessation education, continue nicotine  patch - Recommend outpatient follow-up with PCP for discussion in regards to further decline/Wellbutrin.   GERD - Continue Protonix   DVT prophylaxis: enoxaparin  (LOVENOX ) injection 40 mg Start: 10/27/23 1500   Code Status:   Code Status: Full Code  Family Communication:  Significant other updated over the phone  Status is: Inpatient  Dispo: The patient is from: Home              Anticipated d/c is to: Home              Anticipated d/c date is: 24 to 48 hours              Patient currently not medically stable for discharge given need for IV steroids in the setting of profound asthma exacerbation  Consultants:  None  Procedures:  None  Antimicrobials:  None  Subjective: No acute issues or events overnight, respiratory status improving but not yet back to baseline.  Otherwise denies nausea vomiting diarrhea constipation headache fevers chills or chest pain  Objective: Vitals:   10/27/23 2036 10/28/23 0022 10/28/23 0445 10/28/23 0536  BP: 120/88 117/84 109/80   Pulse: (!) 103 (!) 110 (!) 106   Resp: 19 18 20    Temp: 97.6 F (36.4 C) 97.9 F (36.6 C) 97.9 F (36.6 C)   TempSrc:   Oral   SpO2: 93% 97% 94% 95%  Weight:      Height:        Intake/Output Summary (Last 24 hours) at 10/28/2023 0711 Last data filed at 10/28/2023 0500 Gross per 24 hour  Intake 450 ml  Output --  Net 450 ml   Filed Weights   10/27/23 1206  Weight: 53.1 kg    Examination:  General:  Pleasantly resting in bed, No acute distress. HEENT:  Normocephalic atraumatic.  Sclerae nonicteric, noninjected.  Extraocular movements intact bilaterally. Neck:  Without mass or deformity.  Trachea is midline. Lungs: Scant end expiratory wheeze without accessory muscle use. Heart:  Regular rate and rhythm.  Without murmurs, rubs, or gallops. Abdomen:  Soft, nontender, nondistended.  Without guarding or rebound. Extremities: Without cyanosis, clubbing, edema, or obvious deformity. Skin:  Warm and dry, no erythema.   Data Reviewed: I have personally reviewed following labs and imaging studies  CBC: Recent Labs  Lab 10/27/23 1224 10/28/23 0235  WBC 11.0* 14.9*  NEUTROABS 7.1  --   HGB 13.5 14.3  HCT 39.2 42.3  MCV 91.0 92.4  PLT 316 277   Basic Metabolic  Panel: Recent Labs  Lab 10/27/23 1224 10/28/23 0235  NA 133* 136  K 4.8 4.6  CL 106 103  CO2 20* 21*  GLUCOSE 157* 161*  BUN 9 10  CREATININE 0.79 0.81  CALCIUM  9.1 9.6   GFR: Estimated Creatinine Clearance: 80.5 mL/min (by C-G formula based on SCr of 0.81 mg/dL). Liver Function Tests: No results for input(s): AST, ALT, ALKPHOS, BILITOT, PROT, ALBUMIN in the last 168 hours. No results for input(s): LIPASE, AMYLASE in the last 168 hours. No results for input(s): AMMONIA in the last 168 hours. Coagulation Profile: No results for input(s): INR, PROTIME in the last 168 hours. Cardiac Enzymes: No results for input(s): CKTOTAL, CKMB, CKMBINDEX, TROPONINI in the last 168 hours. BNP (last 3 results) No results for input(s): PROBNP in the last 8760 hours. HbA1C: No results for input(s): HGBA1C in the last 72 hours. CBG: No results for input(s): GLUCAP in the last 168 hours. Lipid Profile: No results for input(s): CHOL, HDL, LDLCALC, TRIG, CHOLHDL, LDLDIRECT in the last 72 hours. Thyroid  Function Tests: No results for input(s): TSH, T4TOTAL, FREET4, T3FREE, THYROIDAB in the last 72 hours. Anemia Panel: No results for input(s): VITAMINB12, FOLATE, FERRITIN, TIBC, IRON, RETICCTPCT in the last 72 hours. Sepsis Labs: Recent Labs  Lab 10/27/23 1622  PROCALCITON <0.10    Recent Results (from the past 240 hours)  Resp panel by RT-PCR (RSV, Flu A&B, Covid) Anterior Nasal Swab     Status: None   Collection Time: 10/27/23  1:10 PM   Specimen: Anterior Nasal Swab  Result Value Ref Range Status   SARS Coronavirus 2 by RT PCR NEGATIVE NEGATIVE Final   Influenza A by PCR NEGATIVE NEGATIVE Final   Influenza B by PCR NEGATIVE NEGATIVE Final    Comment: (NOTE) The Xpert Xpress SARS-CoV-2/FLU/RSV plus assay is intended as an aid in the diagnosis of influenza from Nasopharyngeal swab specimens and should not be used as a  sole basis for treatment. Nasal washings and aspirates are unacceptable for Xpert Xpress SARS-CoV-2/FLU/RSV testing.  Fact Sheet for Patients: BloggerCourse.com  Fact Sheet for Healthcare Providers: SeriousBroker.it  This test is not yet approved or cleared by the [place]  FDA and has been authorized for detection and/or diagnosis of SARS-CoV-2 by FDA under an Emergency Use Authorization (EUA). This EUA will remain in effect (meaning this test can be used) for the duration of the COVID-19 declaration under Section 564(b)(1) of the Act, 21 U.S.C. section 360bbb-3(b)(1), unless the authorization is terminated or revoked.     Resp Syncytial Virus by PCR NEGATIVE NEGATIVE Final    Comment: (NOTE) Fact Sheet for Patients: BloggerCourse.com  Fact Sheet for Healthcare Providers: SeriousBroker.it  This test is not yet approved or cleared by the [place]  FDA and has been authorized for detection and/or diagnosis of SARS-CoV-2 by FDA under an Emergency Use Authorization (EUA). This EUA will remain in effect (meaning this test can be used) for the duration of the COVID-19 declaration under Section 564(b)(1) of the Act, 21 U.S.C. section 360bbb-3(b)(1), unless the authorization is  terminated or revoked.  Performed at [facility], [address], [place], [place] [address]      Radiology Studies: DG Chest Port 1 View Result Date: 10/27/2023 CLINICAL DATA:  Shortness of breath. Productive cough. Former smoker. EXAM: PORTABLE CHEST - 1 VIEW COMPARISON:  03/02/2023 FINDINGS: Cardiomediastinal silhouette and pulmonary vasculature are within normal limits. Lungs are clear. IMPRESSION: No acute cardiopulmonary process. Electronically Signed   By: Aliene Lloyd M.D.   On: 10/27/2023 12:41   Scheduled Meds:  albuterol   2.5 mg Nebulization QID   enoxaparin  (LOVENOX ) injection  40  mg Subcutaneous Q24H   guaiFENesin   600 mg Oral BID   nicotine   21 mg Transdermal Daily   pantoprazole   40 mg Oral QPM   predniSONE   40 mg Oral Q breakfast   sodium chloride  flush  3 mL Intravenous Q12H   Continuous Infusions:   LOS: 0 days   Time spent:  Elsie JAYSON Montclair, DO [facility]  If 7PM-7AM, please contact night-coverage [email or web address]  10/28/2023, 7:11 AM

## 2023-10-28 NOTE — Plan of Care (Signed)

## 2023-10-28 NOTE — Progress Notes (Signed)
 Nurse requested Mobility Specialist to perform oxygen  saturation test with pt which includes removing pt from oxygen  both at rest and while ambulating.  Below are the results from that testing.     Patient Saturations on Room Air at Rest = spO2 94%  Patient Saturations on Room Air while Ambulating = sp02 88-93% .    At end of testing pt left in room on RA.   Reported results to nurse.

## 2023-10-28 NOTE — Progress Notes (Signed)
 Mobility Specialist: Progress Note   10/28/23 1300  Mobility  Activity Ambulated independently in hallway  Level of Assistance Independent  Assistive Device None  Distance Ambulated (ft) 700 ft  Activity Response Tolerated well  Mobility Referral Yes  Mobility visit 1 Mobility  Mobility Specialist Start Time (ACUTE ONLY) 1243  Mobility Specialist Stop Time (ACUTE ONLY) 1252  Mobility Specialist Time Calculation (min) (ACUTE ONLY) 9 min    Pre Mobility: SpO2 93% RA During Mobility: SpO2 87-91% RA Post Mobility: SpO2 93% RA  Pt received in bed, eager for mobility session. SpO2 87-91% on RA throughout ambulation. Feeling SOB mid-way through ambulation. Ambulated through the hallway and returned without fault. Pt also mentioned to MS that she has been using PEP device and incentive spirometer as instructed by respiratory therapy. Left in bed with all needs met, call bell in reach.   Ileana Lute Mobility Specialist Please contact via SecureChat or Rehab office at [phone]

## 2023-10-28 NOTE — Progress Notes (Signed)
 Transition of Care [facility]) - Inpatient Brief Assessment   Patient Details  Name: [name] MRN: [id number] Date of Birth: 03-Sep-1985  [facility] CM/SW Contact:    Rosaline JONELLE Joe, RN Phone Number: 10/28/2023, 3:14 PM   Clinical Narrative: Patient admitted to the hospital for asthma flare up and low oxygen  saturation.  CM will continue to follow the patient for [facility] needs as patient progresses.  Smoking cessation resource placed in the AVs since patient is a current smoker.   Transition of Care Asessment: Insurance and Status: (P) Insurance coverage has been reviewed Patient has primary care physician: (P) Yes Home environment has been reviewed: (P) from home Prior level of function:: (P) Independent Prior/Current Home Services: (P) No current home services Social Drivers of Health Review: (P) SDOH reviewed no interventions necessary Readmission risk has been reviewed: (P) Yes Transition of care needs: (P) no transition of care needs at this time

## 2023-10-28 NOTE — Progress Notes (Signed)
 Mobility Specialist: Progress Note   10/28/23 0941  Mobility  Activity Ambulated independently in hallway  Level of Assistance Independent  Assistive Device None  Distance Ambulated (ft) 700 ft  Activity Response Tolerated well  Mobility Referral Yes  Mobility visit 1 Mobility  Mobility Specialist Start Time (ACUTE ONLY) 0925  Mobility Specialist Stop Time (ACUTE ONLY) [id number]  Mobility Specialist Time Calculation (min) (ACUTE ONLY) 13 min    Pt received ambulating in room, agreeable to mobility session. Completed ambulatory sat test (see following note). Feeling a little SOB towards EOS but otherwise no complaints. Breathing doing much better after breathing treatment and pt coughing up much more sputum. Returned to room without fault. Left in bed with all needs met, call bell in reach.   [name] Mobility Specialist Please contact via SecureChat or Rehab office at [phone]

## 2023-10-29 DIAGNOSIS — J45901 Unspecified asthma with (acute) exacerbation: Secondary | ICD-10-CM | POA: Diagnosis not present

## 2023-10-29 LAB — MAGNESIUM: Magnesium: 2.1 mg/dL (ref 1.7–2.4)

## 2023-10-29 MED ORDER — PREDNISONE 20 MG PO TABS
40.0000 mg | ORAL_TABLET | Freq: Every day | ORAL | Status: DC
  Administered 2023-10-29: 40 mg via ORAL
  Filled 2023-10-29: qty 2

## 2023-10-29 MED ORDER — METHYLPREDNISOLONE SODIUM SUCC 125 MG IJ SOLR
80.0000 mg | Freq: Two times a day (BID) | INTRAMUSCULAR | Status: DC
  Administered 2023-10-29: 80 mg via INTRAVENOUS
  Filled 2023-10-29: qty 2

## 2023-10-29 MED ORDER — OXYCODONE HCL 10 MG PO TABS: 10.0000 mg | ORAL_TABLET | Freq: Three times a day (TID) | ORAL | 0 refills | Status: AC

## 2023-10-29 MED ORDER — LEVALBUTEROL HCL 1.25 MG/0.5ML IN NEBU
1.2500 mg | INHALATION_SOLUTION | Freq: Four times a day (QID) | RESPIRATORY_TRACT | Status: DC
  Administered 2023-10-29 (×2): 1.25 mg via RESPIRATORY_TRACT
  Filled 2023-10-29 (×4): qty 0.5

## 2023-10-29 MED ORDER — PROMETHAZINE HCL 25 MG PO TABS
25.0000 mg | ORAL_TABLET | Freq: Four times a day (QID) | ORAL | 0 refills | Status: AC | PRN
Start: 2023-10-29 — End: ?

## 2023-10-29 MED ORDER — PREDNISONE 10 MG PO TABS: ORAL_TABLET | ORAL | 0 refills | Status: AC

## 2023-10-29 MED ORDER — LEVALBUTEROL HCL 1.25 MG/0.5ML IN NEBU
1.2500 mg | INHALATION_SOLUTION | RESPIRATORY_TRACT | Status: DC | PRN
  Filled 2023-10-29: qty 0.5

## 2023-10-29 MED ORDER — BUDESONIDE 0.5 MG/2ML IN SUSP: 0.5000 mg | Freq: Two times a day (BID) | RESPIRATORY_TRACT | 0 refills | Status: DC

## 2023-10-29 MED ORDER — IPRATROPIUM BROMIDE 0.02 % IN SOLN
0.5000 mg | Freq: Four times a day (QID) | RESPIRATORY_TRACT | Status: DC
  Administered 2023-10-29: 0.5 mg via RESPIRATORY_TRACT
  Filled 2023-10-29: qty 2.5

## 2023-10-29 MED ORDER — NICOTINE 21 MG/24HR TD PT24: 21.0000 mg | MEDICATED_PATCH | Freq: Every day | TRANSDERMAL | 0 refills | Status: AC

## 2023-10-29 MED ORDER — LEVALBUTEROL HCL 1.25 MG/0.5ML IN NEBU: 1.2500 mg | INHALATION_SOLUTION | Freq: Four times a day (QID) | RESPIRATORY_TRACT | 0 refills | Status: AC

## 2023-10-29 MED ORDER — IPRATROPIUM BROMIDE 0.02 % IN SOLN: 0.5000 mg | Freq: Four times a day (QID) | RESPIRATORY_TRACT | 0 refills | Status: AC

## 2023-10-29 NOTE — Plan of Care (Signed)

## 2023-10-29 NOTE — Discharge Summary (Signed)
 Physician Discharge Summary  [name] FMW:[id number] DOB: 09-Jun-1985 DOA: 10/27/2023  PCP: [facility]  Admit date: 10/27/2023 Discharge date: 10/29/2023  Admitted From: Home Disposition: Home  Recommendations for Outpatient Follow-up:  Follow up with PCP in 1-2 weeks Follow-up with pulmonology next week as scheduled  Home Health: None Equipment/Devices: None  Discharge Condition: Stable CODE STATUS: Full Diet recommendation: Low-salt low-fat diet  Brief/Interim Summary: [name] is a 38 y.o. female with medical history significant of pre-eclampsia, intermittent asthma, recurrent pancreatitis, and tobacco abuse presents with dyspnea and hypoxia.  Patient sent in from PCP office with sats in the low 80s at rest.  Patient indicates her nebulizer recently malfunctioned likely the cause of her current exacerbation, hospitalist called for admission.  Patient admitted as above with acute hypoxic respiratory failure in the setting of asthma exacerbation.  Patient improved quite drastically over the past 48 hours.  It appears that her nebulizer at home has been malfunctioning which is likely the etiology of her exacerbation.  Will discharge patient with new nebulizer as well as will transition her off of DuoNeb onto levalbuterol  to avoid any further tachycardia given patient's symptoms.  Patient did have 2 episodes of what appeared to be nocturnal asthma versus possible underlying sleep apnea.  She woke up both times with air hunger but was not hypoxic.  Today she is ambulating without further hypoxia or symptoms, otherwise stable and agreeable for discharge home.  Will discharge on twice daily inhaled steroid as well as levalbuterol  and ipratropium.  Steroid taper to continue until pulmonology follow-up.  Discharge Diagnoses:  Principal Problem:   Asthma, chronic, unspecified asthma severity, with acute exacerbation Active Problems:   Leukocytosis    Hyponatremia   Chronic pain syndrome   Tobacco abuse   GERD (gastroesophageal reflux disease)   Acute asthma exacerbation    Discharge Instructions  Discharge Instructions     Call MD for:   Complete by: As directed    Worsening shortness of breath or wheeze   Call MD for:  difficulty breathing, headache or visual disturbances   Complete by: As directed    Call MD for:  extreme fatigue   Complete by: As directed    Call MD for:  persistant dizziness or light-headedness   Complete by: As directed    Diet - low sodium heart healthy   Complete by: As directed    For home use only DME Nebulizer machine   Complete by: As directed    Patient needs a nebulizer to treat with the following condition: Acute asthma exacerbation   Length of Need: Lifetime   Additional equipment included:  Administration kit Filter     Increase activity slowly   Complete by: As directed       Allergies as of 10/29/2023       Reactions   Bee Venom Anaphylaxis   Other Anaphylaxis, Other (See Comments)   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     Fentanyl     Chest heaviness   Morphine  Itching   Toradol  [ketorolac  Tromethamine ] Other (See Comments)   Anxiety and burning in the stomach   Reglan  [metoclopramide ] Anxiety        Medication List     TAKE these medications    acetaminophen  500 MG tablet Commonly known as: TYLENOL  Take 1,000 mg by mouth every 6 (six) hours as needed for mild pain or moderate pain.   budesonide  0.5 MG/2ML nebulizer solution Commonly known  as: PULMICORT  Take 2 mLs (0.5 mg total) by nebulization 2 (two) times daily.   dicyclomine  20 MG tablet Commonly known as: BENTYL  Take 1 tablet (20 mg total) by mouth 2 (two) times daily.   ipratropium 0.02 % nebulizer solution Commonly known as: ATROVENT  Take 2.5 mLs (0.5 mg total) by nebulization every 6 (six) hours.   levalbuterol  1.25 MG/0.5ML nebulizer solution Commonly known as: XOPENEX  Take 1.25 mg by  nebulization every 6 (six) hours.   nicotine  21 mg/24hr patch Commonly known as: NICODERM CQ  - dosed in mg/24 hours Place 1 patch (21 mg total) onto the skin daily. Start taking on: October 30, 2023   Oxycodone  HCl 10 MG Tabs Take 1 tablet (10 mg total) by mouth every 8 (eight) hours for 3 days.   pantoprazole  40 MG tablet Commonly known as: PROTONIX  Take 40 mg by mouth every evening.   predniSONE  10 MG tablet Commonly known as: DELTASONE  Take 4 tablets (40 mg total) by mouth daily for 3 days, THEN 3 tablets (30 mg total) daily for 3 days, THEN 2 tablets (20 mg total) daily for 3 days, THEN 1 tablet (10 mg total) daily for 3 days. Start taking on: October 29, 2023   promethazine  25 MG tablet Commonly known as: PHENERGAN  Take 1 tablet (25 mg total) by mouth every 6 (six) hours as needed for vomiting or nausea.   Ventolin  HFA 108 (90 Base) MCG/ACT inhaler Generic drug: albuterol  Inhale 1-2 puffs into the lungs every 4 (four) hours as needed for wheezing or shortness of breath. What changed: Another medication with the same name was removed. Continue taking this medication, and follow the directions you see here.   Voltaren 1 % Gel Generic drug: diclofenac Sodium Apply 2 g topically daily as needed (pain).               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     Fentanyl      Chest heaviness   Morphine  Itching   Toradol  [Ketorolac  Tromethamine ] Other  (See Comments)    Anxiety and burning in the stomach   Reglan  [Metoclopramide ] Anxiety    Consultations: None  Procedures/Studies: DG Chest Port 1 View Result Date: 10/27/2023 CLINICAL DATA:  Shortness of breath. Productive cough. Former smoker. EXAM: PORTABLE CHEST - 1 VIEW COMPARISON:  03/02/2023 FINDINGS: Cardiomediastinal silhouette and pulmonary vasculature are within normal limits. Lungs are clear. IMPRESSION: No acute cardiopulmonary process. Electronically Signed   By: Aliene Lloyd M.D.   On: 10/27/2023 12:41     Subjective: No acute issues or events overnight   Discharge Exam: Vitals:   10/29/23 0847 10/29/23 1116  BP:  (!) 122/90  Pulse:  (!) 107  Resp:  18  Temp:  98.1 F (36.7 C)  SpO2: 97% 91%   Vitals:   10/29/23  0451 10/29/23 0729 10/29/23 0847 10/29/23 1116  BP: (!) 120/91 128/82  (!) 122/90  Pulse: 96 96  (!) 107  Resp: 16 18  18   Temp: 98.1 F (36.7 C) 98.1 F (36.7 C)  98.1 F (36.7 C)  TempSrc:      SpO2: 97% (!) 89% 97% 91%  Weight:      Height:        General: Pt is alert, awake, not in acute distress Cardiovascular: RRR, S1/S2 +, no rubs, no gallops Respiratory: Scant end expiratory wheeze right greater than left Abdominal: Soft, NT, ND, bowel sounds + Extremities: no edema, no cyanosis    The results of significant diagnostics from this hospitalization (including imaging, microbiology, ancillary and laboratory) are listed below for reference.     Microbiology: Recent Results (from the past 240 hours)  Resp panel by RT-PCR (RSV, Flu A&B, Covid) Anterior Nasal Swab     Status: None   Collection Time: 10/27/23  1:10 PM   Specimen: Anterior Nasal Swab  Result Value Ref Range Status   SARS Coronavirus 2 by RT PCR NEGATIVE NEGATIVE Final   Influenza A by PCR NEGATIVE NEGATIVE Final   Influenza B by PCR NEGATIVE NEGATIVE Final    Comment: (NOTE) The Xpert Xpress SARS-CoV-2/FLU/RSV plus assay is intended as an aid in the diagnosis of  influenza from Nasopharyngeal swab specimens and should not be used as a sole basis for treatment. Nasal washings and aspirates are unacceptable for Xpert Xpress SARS-CoV-2/FLU/RSV testing.  Fact Sheet for Patients: BloggerCourse.com  Fact Sheet for Healthcare Providers: SeriousBroker.it  This test is not yet approved or cleared by the [place]  FDA and has been authorized for detection and/or diagnosis of SARS-CoV-2 by FDA under an Emergency Use Authorization (EUA). This EUA will remain in effect (meaning this test can be used) for the duration of the COVID-19 declaration under Section 564(b)(1) of the Act, 21 U.S.C. section 360bbb-3(b)(1), unless the authorization is terminated or revoked.     Resp Syncytial Virus by PCR NEGATIVE NEGATIVE Final    Comment: (NOTE) Fact Sheet for Patients: BloggerCourse.com  Fact Sheet for Healthcare Providers: SeriousBroker.it  This test is not yet approved or cleared by the [place]  FDA and has been authorized for detection and/or diagnosis of SARS-CoV-2 by FDA under an Emergency Use Authorization (EUA). This EUA will remain in effect (meaning this test can be used) for the duration of the COVID-19 declaration under Section 564(b)(1) of the Act, 21 U.S.C. section 360bbb-3(b)(1), unless the authorization is terminated or revoked.  Performed at [facility], [address], [place], [place] [address]      Labs: BNP (last 3 results) No results for input(s): BNP in the last 8760 hours. Basic Metabolic Panel: Recent Labs  Lab 10/27/23 1224 10/28/23 0235 10/29/23 0346  NA 133* 136  --   K 4.8 4.6  --   CL 106 103  --   CO2 20* 21*  --   GLUCOSE 157* 161*  --   BUN 9 10  --   CREATININE 0.79 0.81  --   CALCIUM  9.1 9.6  --   MG  --   --  2.1   Liver Function Tests: No results for input(s): AST, ALT, ALKPHOS,  BILITOT, PROT, ALBUMIN in the last 168 hours. No results for input(s): LIPASE, AMYLASE in the last 168 hours. No results for input(s): AMMONIA in the last 168 hours. CBC: Recent Labs  Lab 10/27/23 1224 10/28/23 0235  WBC 11.0* 14.9*  NEUTROABS 7.1  --   HGB 13.5 14.3  HCT 39.2 42.3  MCV 91.0 92.4  PLT 316 277   Cardiac Enzymes: No results for input(s): CKTOTAL, CKMB, CKMBINDEX, TROPONINI in the last 168 hours. BNP: Invalid input(s): POCBNP CBG: No results for input(s): GLUCAP in the last 168 hours. D-Dimer No results for input(s): DDIMER in the last 72 hours. Hgb A1c No results for input(s): HGBA1C in the last 72 hours. Lipid Profile No results for input(s): CHOL, HDL, LDLCALC, TRIG, CHOLHDL, LDLDIRECT in the last 72 hours. Thyroid  function studies No results for input(s): TSH, T4TOTAL, T3FREE, THYROIDAB in the last 72 hours.  Invalid input(s): FREET3 Anemia work up No results for input(s): VITAMINB12, FOLATE, FERRITIN, TIBC, IRON, RETICCTPCT in the last 72 hours. Urinalysis    Component Value Date/Time   COLORURINE YELLOW 10/16/2023 2028   APPEARANCEUR CLEAR 10/16/2023 2028   LABSPEC 1.017 10/16/2023 2028   PHURINE 5.0 10/16/2023 2028   GLUCOSEU 50 (A) 10/16/2023 2028   HGBUR NEGATIVE 10/16/2023 2028   BILIRUBINUR NEGATIVE 10/16/2023 2028   KETONESUR NEGATIVE 10/16/2023 2028   PROTEINUR NEGATIVE 10/16/2023 2028   UROBILINOGEN 0.2 02/09/2015 1109   NITRITE NEGATIVE 10/16/2023 2028   LEUKOCYTESUR NEGATIVE 10/16/2023 2028   Sepsis Labs Recent Labs  Lab 10/27/23 1224 10/28/23 0235  WBC 11.0* 14.9*   Microbiology Recent Results (from the past 240 hours)  Resp panel by RT-PCR (RSV, Flu A&B, Covid) Anterior Nasal Swab     Status: None   Collection Time: 10/27/23  1:10 PM   Specimen: Anterior Nasal Swab  Result Value Ref Range Status   SARS Coronavirus 2 by RT PCR NEGATIVE NEGATIVE Final   Influenza  A by PCR NEGATIVE NEGATIVE Final   Influenza B by PCR NEGATIVE NEGATIVE Final    Comment: (NOTE) The Xpert Xpress SARS-CoV-2/FLU/RSV plus assay is intended as an aid in the diagnosis of influenza from Nasopharyngeal swab specimens and should not be used as a sole basis for treatment. Nasal washings and aspirates are unacceptable for Xpert Xpress SARS-CoV-2/FLU/RSV testing.  Fact Sheet for Patients: BloggerCourse.com  Fact Sheet for Healthcare Providers: SeriousBroker.it  This test is not yet approved or cleared by the [place]  FDA and has been authorized for detection and/or diagnosis of SARS-CoV-2 by FDA under an Emergency Use Authorization (EUA). This EUA will remain in effect (meaning this test can be used) for the duration of the COVID-19 declaration under Section 564(b)(1) of the Act, 21 U.S.C. section 360bbb-3(b)(1), unless the authorization is terminated or revoked.     Resp Syncytial Virus by PCR NEGATIVE NEGATIVE Final    Comment: (NOTE) Fact Sheet for Patients: BloggerCourse.com  Fact Sheet for Healthcare Providers: SeriousBroker.it  This test is not yet approved or cleared by the [place]  FDA and has been authorized for detection and/or diagnosis of SARS-CoV-2 by FDA under an Emergency Use Authorization (EUA). This EUA will remain in effect (meaning this test can be used) for the duration of the COVID-19 declaration under Section 564(b)(1) of the Act, 21 U.S.C. section 360bbb-3(b)(1), unless the authorization is terminated or revoked.  Performed at [facility], [address] [place], [place] [address]      Time coordinating discharge: Over 30 minutes  SIGNED:   Elsie JAYSON Montclair, DO [facility] 10/29/2023, 1:27 PM Pager   If 7PM-7AM, please contact night-coverage [email or web address]

## 2023-10-29 NOTE — Progress Notes (Signed)
 [facility] night cross cover note:   Regarding this patient who is hospitalized with acute asthma exacerbation, I was notified by the patient's RN that the patient has woke up feeling more short of breath, coughing, with diffuse expiratory wheezing noted bilaterally, along with increased wob.  Her oxygen  saturations were in the low 90s at this time, prompting placement 2 L nasal cannula, with ensuing increase in oxygen  saturations into the mid 90s.  Corresponding heart rates are elevated into the 120s, associated with sinus tachycardia.  Remains afebrile, and normotensive with systolic blood pressures in the 120s mmHg. she is currently on scheduled budesonide , prn albuterol  nebulizer, as well as prednisone  40 mg p.o. daily.   In the setting of the patient feeling more short of breath, with diffuse expiratory wheezing and increased work of breathing as well as tachycardia, I have converted her prednisone  to solumedrol, first dose now, added scheduled Xopenex /ipratropium nebulizer treatments, converted her existing prn albuterol  nebulizer to prn Xopenex  nebulizer.  I have also ordered a stat magnesium  level.    Eva Pore, DO Hospitalist

## 2023-11-08 ENCOUNTER — Encounter: Payer: Self-pay | Admitting: Internal Medicine

## 2023-11-08 ENCOUNTER — Ambulatory Visit: Admitting: Internal Medicine

## 2023-11-08 VITALS — BP 119/92 | HR 96 | Ht 64.0 in | Wt 119.0 lb

## 2023-11-08 DIAGNOSIS — J4489 Other specified chronic obstructive pulmonary disease: Secondary | ICD-10-CM

## 2023-11-08 MED ORDER — ALBUTEROL SULFATE HFA 108 (90 BASE) MCG/ACT IN AERS: 1.0000 | INHALATION_SPRAY | RESPIRATORY_TRACT | 1 refills | Status: DC | PRN

## 2023-11-08 MED ORDER — FAMOTIDINE 20 MG PO TABS: ORAL_TABLET | ORAL | 11 refills | Status: AC

## 2023-11-08 MED ORDER — BUDESONIDE-FORMOTEROL FUMARATE 160-4.5 MCG/ACT IN AERO: INHALATION_SPRAY | RESPIRATORY_TRACT | 12 refills | Status: AC

## 2023-11-08 NOTE — Patient Instructions (Addendum)
 Plan A = Automatic = Always=  Symbicort  160 Take 2 puffs first thing in am and then another 2 puffs about 12 hours later.    Plan B = Backup (to supplement plan A, not to replace it) Use your albuterol  inhaler as a rescue medication to be used if you can't catch your breath by resting or slowing your pace  or doing a relaxed purse lip breathing pattern.  - The less you use it, the better it will work when you need it. - Ok to use the inhaler up to 2 puffs  every 4 hours if you must but call for appointment if use goes up over your usual need - Don't leave home without it !!  (think of it like the spare tire or starter fluid for your car)   Plan C = Crisis (instead of Plan B but only if Plan B stops working) - only use your levoalbuterol-ipatropim nebulizer if you first try Plan B and it fails to help > ok to use the nebulizer up to every 4 hours but if start needing it regularly call for immediate appointment  Protonix  Take 30-60 min before first meal of the day and pepcid  20 mg one in evening   Please remember to go to the lab department   for your tests - we will call you with the results when they are available.   Please schedule a follow up office visit in 6 weeks, call sooner if needed- bring your inhalers / neb solution

## 2023-11-08 NOTE — Progress Notes (Signed)
 [name], female    DOB: 1985/04/24    MRN: [id number]   Brief patient profile:  38   yowf quit smoking 1st week July 2015  referred to pulmonary clinic in [facility]  11/08/2023 by Eleanor Lamer PA [facility]  for recurrent AB   Onset problems 2019 dx as pna/ bronchitis rx albuterol  hfa prn and back to baseline s daily saba but then neb dpe p admit in [facility] May 2024 x 9 days but d/c off 02 and fine until  09/10/23 more sob and change in mucus thick and green > admit  Admit date: 10/27/2023 Discharge date: 10/29/2023   Admitted From: Home Disposition: Home   Recommendations for Outpatient Follow-up:  Follow up with PCP in 1-2 weeks Follow-up with pulmonology next week as scheduled   Home Health: None Equipment/Devices: None      Brief/Interim Summary: [name] is a 38 y.o. female with medical history significant of pre-eclampsia, intermittent asthma, recurrent pancreatitis, and tobacco abuse presents with dyspnea and hypoxia.  Patient sent in from PCP office with sats in the low 80s at rest.  Patient indicates her nebulizer recently malfunctioned likely the cause of her current exacerbation, hospitalist called for admission.   Patient admitted as above with acute hypoxic respiratory failure in the setting of asthma exacerbation.  Patient improved quite dramatically  over the past 48 hours   Will discharge patient with new nebulizer as well as will transition her off of DuoNeb onto levalbuterol  to avoid any further tachycardia given patient's symptoms.  Patient did have 2 episodes of what appeared to be nocturnal asthma versus possible underlying sleep apnea.  She woke up both times with air hunger but was not hypoxic.  Today she is ambulating without further hypoxia or symptoms, otherwise stable and agreeable for discharge home.  Will discharge on twice daily inhaled steroid as well as levalbuterol  and ipratropium.  Steroid taper to continue until pulmonology  follow-up.   Discharge Diagnoses:  Principal Problem:   Asthma, chronic, unspecified asthma severity, with acute exacerbation   Leukocytosis   Hyponatremia   Chronic pain syndrome   Tobacco abuse   GERD (gastroesophageal reflux disease)   Acute asthma exacerbation     History of Present Illness  11/08/2023  Pulmonary/ 1st office eval/ [name] / [facility] last few days of pred and now maint on pulmocort 0.5 nebs/duoneb Chief Complaint  Patient presents with   Establish Care  Dyspnea:  housework ok  Cough: minimal clear mucus now Sleep: bed is flat but gets head up 45 degrees up /last episode noct choking on phlegm 2 nights prior to OV  but insists much betternow and no problem with swallowing SABA use: neb q 6-8 hours  02: none     No obvious day to day or daytime pattern/variability or assoc  ongoing excess/ purulent sputum or mucus plugs or hemoptysis or cp or  subjective wheeze or overt sinus or hb symptoms.    Also denies any obvious fluctuation of symptoms with weather or environmental changes or other aggravating or alleviating factors except as outlined above   No unusual exposure hx or h/o childhood pna/ asthma or knowledge of premature birth.  Current Allergies, Complete Past Medical History, Past Surgical History, Family History, and Social History were reviewed in [employer] record.  ROS  The following are not active complaints unless bolded Hoarseness, sore throat, dysphagia, dental problems, itching, sneezing,  nasal congestion or discharge of  excess mucus or purulent secretions, ear ache,   fever, chills, sweats, unintended wt loss or wt gain, classically pleuritic or exertional cp,  orthopnea pnd or arm/hand swelling  or leg swelling, presyncope, palpitations, abdominal pain, anorexia, nausea, vomiting, diarrhea  or change in bowel habits or change in bladder habits, change in stools or change in urine, dysuria, hematuria,  rash,  arthralgias, visual complaints, headache, numbness, weakness or ataxia or problems with walking or coordination,  change in mood or  memory.            Outpatient Medications Prior to Visit  Medication Sig Dispense Refill   acetaminophen  (TYLENOL ) 500 MG tablet Take 1,000 mg by mouth every 6 (six) hours as needed for mild pain or moderate pain.     albuterol  (VENTOLIN  HFA) 108 (90 Base) MCG/ACT inhaler Inhale 1-2 puffs into the lungs every 4 (four) hours as needed for wheezing or shortness of breath. 18 g 1   budesonide  (PULMICORT ) 0.5 MG/2ML nebulizer solution Take 2 mLs (0.5 mg total) by nebulization 2 (two) times daily. 56 mL 0   ipratropium (ATROVENT ) 0.02 % nebulizer solution Take 2.5 mLs (0.5 mg total) by nebulization every 6 (six) hours. 300 mL 0   levalbuterol  (XOPENEX ) 1.25 MG/0.5ML nebulizer solution Take 1.25 mg by nebulization every 6 (six) hours. 60 mL 0   nicotine  (NICODERM CQ  - DOSED IN MG/24 HOURS) 21 mg/24hr patch Place 1 patch (21 mg total) onto the skin daily. 28 patch 0   oxyCODONE  (OXY IR/ROXICODONE ) 5 MG immediate release tablet Take 10 mg by mouth every 6 (six) hours as needed for severe pain (pain score 7-10).     pantoprazole  (PROTONIX ) 40 MG tablet Take 40 mg by mouth every evening.     predniSONE  (DELTASONE ) 10 MG tablet Take 4 tablets (40 mg total) by mouth daily for 3 days, THEN 3 tablets (30 mg total) daily for 3 days, THEN 2 tablets (20 mg total) daily for 3 days, THEN 1 tablet (10 mg total) daily for 3 days. 30 tablet 0   promethazine  (PHENERGAN ) 25 MG tablet Take 1 tablet (25 mg total) by mouth every 6 (six) hours as needed for vomiting or nausea. 30 tablet 0   diclofenac Sodium (VOLTAREN) 1 % GEL Apply 2 g topically daily as needed (pain). (Patient not taking: Reported on 11/08/2023)     dicyclomine  (BENTYL ) 20 MG tablet Take 1 tablet (20 mg total) by mouth 2 (two) times daily. (Patient not taking: Reported on 11/08/2023) 20 tablet 0   No facility-administered  medications prior to visit.    Past Medical History:  Diagnosis Date   Abdominal wall pain    chronic; per [facility] records 07/2012   Anemia    Anxiety    Chronic abdominal pain    Chronic pancreatitis (HCC)    Depression    Gastritis    GERD (gastroesophageal reflux disease)    Headache    migraines   Hemorrhage in pregnancy    History of preterm delivery, currently pregnant in first trimester 05/05/2015   HPV in female    Hypertension    mainly during pregnancy   Nausea & vomiting 05/05/2015   Opiate dependence (HCC) 02/27/2012   Osteomyelitis of leg (HCC)    right tibia, 2009   Pancreatitis    Pancreatitis    Pneumonia    Tobacco abuse    Vaginal Pap smear, abnormal       Objective:     BP (!) 119/92  Pulse 96   Ht 5' 4 (1.626 m)   Wt 119 lb (54 kg)   LMP 10/02/2023 (Approximate)   SpO2 99% Comment: RA  BMI 20.43 kg/m   SpO2: 99 % (RA)  Pleasant but very anxious fast talking wf nad    HEENT : Oropharynx  clear     Nasal turbinates nl    NECK :  without  apparent JVD/ palpable Nodes/TM    LUNGS: no acc muscle use,  Nl contour chest which is clear to A and P bilaterally without cough on insp or exp maneuvers   CV:  RRR  no s3 or murmur or increase in P2, and no edema   ABD:  soft and nontender   MS:  Gait nl   ext warm without deformities Or obvious joint restrictions  calf tenderness, cyanosis or clubbing    SKIN: warm and dry without lesions    NEURO:  alert, approp, nl sensorium with  no motor or cerebellar deficits apparent.    I personally reviewed images and agree with radiology impression as follows:  CXR:   portable 10/27/23  Wnl  My review: slt hyperinflation    Assessment   Asthmatic bronchitis , chronic (HCC) Onset 2019  - Quit smoking early July 2025  - 11/08/2023  After extensive coaching inhaler device,  effectiveness = 75% with hfa so rec >>>   symbicort  160 2bid and more approp saba  - Allergy screen 11/08/2023 >  Eos 0. /   IgE  and alpha one AT phenotype  DDX of  difficult airways management almost all start with A and  include Adherence, Ace Inhibitors, Acid Reflux, Active Sinus Disease, Alpha 1 Antitripsin deficiency, Anxiety masquerading as Airways dz,  ABPA,  Allergy(esp in young), Aspiration (esp in elderly), Adverse effects of meds,  Active smoking or vaping, A bunch of PE's (a small clot burden can't cause this syndrome unless there is already severe underlying pulm or vascular dz with poor reserve) plus two Bs  = Bronchiectasis and Beta blocker use..and one C= CHF  Adherence is always the initial prime suspect and is a multilayered concern that requires a trust but verify approach in every patient - starting with knowing how to use medications, especially inhalers, correctly, keeping up with refills and understanding the fundamental difference between maintenance and prns vs those medications only taken for a very short course and then stopped and not refilled.  - see hfa teaching - return  with all meds in hand using a trust but verify approach to confirm accurate Medication  Reconciliation The principal here is that until we are certain that the  patients are doing what we've asked, it makes no sense to ask them to do more.   ? Active smoking > denies bu husband is a smoker so high risk resuming smoking discussed with pt and husband  ? Allergy/ABPA> note high Eos pre admit > high dose ICS  and more apprp saba: Re SABA :  I spent extra time with pt today reviewing appropriate use of albuterol  for prn use on exertion with the following points: 1) saba is for relief of sob that does not improve by walking a slower pace or resting but rather if the pt does not improve after trying this first. 2) If the pt is convinced, as many are, that saba helps recover from activity faster then it's easy to tell if this is the case by re-challenging : ie stop, take the inhaler, then p  5 minutes try the exact same activity  (intensity of workload) that just caused the symptoms and see if they are substantially diminished or not after saba 3) if there is an activity that reproducibly causes the symptoms, try the saba 15 min before the activity on alternate days   If in fact the saba really does help, then fine to continue to use it prn but advised may need to look closer at the maintenance regimen (symbicort  160 for now)  being used to achieve better control of airways disease with exertion.   ? Anxiety /hyperventilation/VCD > usually at the bottom of this list of usual suspects but   may interfere with adherence and also interpretation of response or lack thereof to symptom management which can be quite subjective.   ? Acid (or non-acid) GERD > always difficult to exclude as up to 75% of pts in some series report no assoc GI/ Heartburn symptoms> rec max (24h)  acid suppression and diet restrictions/ reviewed and instructions given in writing.   ? Alpha one def > check phenotype   F/u in 6 weeks as above - call sooner if needed         Each maintenance medication was reviewed in detail including emphasizing most importantly the difference between maintenance and prns and under what circumstances the prns are to be triggered using an action plan format where appropriate.  Total time for H and P, chart review, counseling, reviewing hfa/ neb  device(s) and generating customized AVS unique to this office visit / same day charting = 65 min new high risk pt with refractory resp symptoms.          Ozell America, MD 11/08/2023

## 2023-11-08 NOTE — Assessment & Plan Note (Addendum)
 Onset 2019  - Quit smoking early July 2025  - 11/08/2023  After extensive coaching inhaler device,  effectiveness = 75% with hfa so rec >>>   symbicort  160 2bid and more approp saba  - Allergy screen 11/08/2023 >  Eos 0. /  IgE  and alpha one AT phenotype  DDX of  difficult airways management almost all start with A and  include Adherence, Ace Inhibitors, Acid Reflux, Active Sinus Disease, Alpha 1 Antitripsin deficiency, Anxiety masquerading as Airways dz,  ABPA,  Allergy(esp in young), Aspiration (esp in elderly), Adverse effects of meds,  Active smoking or vaping, A bunch of PE's (a small clot burden can't cause this syndrome unless there is already severe underlying pulm or vascular dz with poor reserve) plus two Bs  = Bronchiectasis and Beta blocker use..and one C= CHF  Adherence is always the initial prime suspect and is a multilayered concern that requires a trust but verify approach in every patient - starting with knowing how to use medications, especially inhalers, correctly, keeping up with refills and understanding the fundamental difference between maintenance and prns vs those medications only taken for a very short course and then stopped and not refilled.  - see hfa teaching - return  with all meds in hand using a trust but verify approach to confirm accurate Medication  Reconciliation The principal here is that until we are certain that the  patients are doing what we've asked, it makes no sense to ask them to do more.   ? Active smoking > denies bu husband is a smoker so high risk resuming smoking discussed with pt and husband  ? Allergy/ABPA> note high Eos pre admit > high dose ICS  and more apprp saba: Re SABA :  I spent extra time with pt today reviewing appropriate use of albuterol  for prn use on exertion with the following points: 1) saba is for relief of sob that does not improve by walking a slower pace or resting but rather if the pt does not improve after trying this  first. 2) If the pt is convinced, as many are, that saba helps recover from activity faster then it's easy to tell if this is the case by re-challenging : ie stop, take the inhaler, then p 5 minutes try the exact same activity (intensity of workload) that just caused the symptoms and see if they are substantially diminished or not after saba 3) if there is an activity that reproducibly causes the symptoms, try the saba 15 min before the activity on alternate days   If in fact the saba really does help, then fine to continue to use it prn but advised may need to look closer at the maintenance regimen (symbicort  160 for now)  being used to achieve better control of airways disease with exertion.   ? Anxiety /hyperventilation/VCD > usually at the bottom of this list of usual suspects but   may interfere with adherence and also interpretation of response or lack thereof to symptom management which can be quite subjective.   ? Acid (or non-acid) GERD > always difficult to exclude as up to 75% of pts in some series report no assoc GI/ Heartburn symptoms> rec max (24h)  acid suppression and diet restrictions/ reviewed and instructions given in writing.   ? Alpha one def > check phenotype   F/u in 6 weeks as above - call sooner if needed         Each maintenance medication was reviewed in  detail including emphasizing most importantly the difference between maintenance and prns and under what circumstances the prns are to be triggered using an action plan format where appropriate.  Total time for H and P, chart review, counseling, reviewing hfa/ neb  device(s) and generating customized AVS unique to this office visit / same day charting = 65 min new high risk pt with refractory resp symptoms.

## 2023-11-11 ENCOUNTER — Ambulatory Visit: Payer: Self-pay | Admitting: Internal Medicine

## 2023-11-11 LAB — CBC WITH DIFFERENTIAL/PLATELET
Basophils Absolute: 0 x10E3/uL (ref 0.0–0.2)
Basos: 0 %
EOS (ABSOLUTE): 0 x10E3/uL (ref 0.0–0.4)
Eos: 0 %
Hematocrit: 43.3 % (ref 34.0–46.6)
Hemoglobin: 14.3 g/dL (ref 11.1–15.9)
Immature Grans (Abs): 0.1 x10E3/uL (ref 0.0–0.1)
Immature Granulocytes: 1 %
Lymphocytes Absolute: 1.9 x10E3/uL (ref 0.7–3.1)
Lymphs: 14 %
MCH: 31.4 pg (ref 26.6–33.0)
MCHC: 33 g/dL (ref 31.5–35.7)
MCV: 95 fL (ref 79–97)
Monocytes Absolute: 0.7 x10E3/uL (ref 0.1–0.9)
Monocytes: 5 %
Neutrophils Absolute: 11.3 x10E3/uL — ABNORMAL HIGH (ref 1.4–7.0)
Neutrophils: 80 %
Platelets: 396 x10E3/uL (ref 150–450)
RBC: 4.55 x10E6/uL (ref 3.77–5.28)
RDW: 13.2 % (ref 11.7–15.4)
WBC: 14.1 x10E3/uL — ABNORMAL HIGH (ref 3.4–10.8)

## 2023-11-11 LAB — IGE: IgE (Immunoglobulin E), Serum: 232 [IU]/mL (ref 6–495)

## 2023-11-11 LAB — ALPHA-1-ANTITRYPSIN PHENOTYP: A-1 Antitrypsin: 167 mg/dL (ref 100–188)

## 2023-11-11 NOTE — Progress Notes (Signed)
 Spoke with pt regarding her labs, she confirmed her understanding, NFn

## 2023-12-07 ENCOUNTER — Other Ambulatory Visit: Payer: Self-pay | Admitting: Internal Medicine

## 2023-12-27 NOTE — Progress Notes (Unsigned)
 [name], female    DOB: Nov 30, 1985    MRN: [id number]   Brief patient profile:  38   yowf/ MM quit smoking 1st week July 2015  referred to pulmonary clinic in [facility]  11/08/2023 by Eleanor Lamer PA [facility]  for recurrent AB   Onset problems 2019 dx as pna/ bronchitis rx albuterol  hfa prn and back to baseline s daily saba but then neb dpe p admit in [facility]  May 2024 x 9 days but d/c off 02 and fine until  09/10/23 more sob and change in mucus thick and green > admit  Admit date: 10/27/2023 Discharge date: 10/29/2023   Admitted From: Home Disposition: Home   Recommendations for Outpatient Follow-up:  Follow up with PCP in 1-2 weeks Follow-up with pulmonology next week as scheduled   Home Health: None Equipment/Devices: None      Brief/Interim Summary: [name] is a 38 y.o. female with medical history significant of pre-eclampsia, intermittent asthma, recurrent pancreatitis, and tobacco abuse presents with dyspnea and hypoxia.  Patient sent in from PCP office with sats in the low 80s at rest.  Patient indicates her nebulizer recently malfunctioned likely the cause of her current exacerbation, hospitalist called for admission.   Patient admitted as above with acute hypoxic respiratory failure in the setting of asthma exacerbation.  Patient improved quite dramatically  over the past 48 hours   Will discharge patient with new nebulizer as well as will transition her off of DuoNeb onto levalbuterol  to avoid any further tachycardia given patient's symptoms.  Patient did have 2 episodes of what appeared to be nocturnal asthma versus possible underlying sleep apnea.  She woke up both times with air hunger but was not hypoxic.  Today she is ambulating without further hypoxia or symptoms, otherwise stable and agreeable for discharge home.  Will discharge on twice daily inhaled steroid as well as levalbuterol  and ipratropium.  Steroid taper to continue until pulmonology  follow-up.   Discharge Diagnoses:  Principal Problem:   Asthma, chronic, unspecified asthma severity, with acute exacerbation   Leukocytosis   Hyponatremia   Chronic pain syndrome   Tobacco abuse   GERD (gastroesophageal reflux disease)   Acute asthma exacerbation     History of Present Illness  11/08/2023  Pulmonary/ 1st office eval/ [name] / [facility] last few days of pred and now maint on pulmocort 0.5 nebs/duoneb Chief Complaint  Patient presents with   Establish Care  Dyspnea:  housework ok  Cough: minimal clear mucus now Sleep: bed is flat but gets head up 45 degrees up /last episode noct choking on phlegm 2 nights prior to OV  but insists much betternow and no problem with swallowing SABA use: neb q 6-8 hours  02: none  Rec Plan A = Automatic = Always=  Symbicort  160 Take 2 puffs first thing in am and then another 2 puffs about 12 hours later.   Plan B = Backup (to supplement plan A, not to replace it) Use your albuterol  inhaler as a rescue medication  Plan C = Crisis (instead of Plan B but only if Plan B stops working) - only use your levoalbuterol-ipatropim nebulizer if you first try Plan B  Protonix  Take 30-60 min before first meal of the day and pepcid  20 mg one in evening  Please schedule a follow up office visit in 6 weeks, call sooner if needed- bring your inhalers / neb solution   - Allergy screen 11/08/2023 >  Eos 0.0/  IgE 232 on last days of pred taper  - Alpha one AT phenotype MM  level 167   11/08/23      12/28/2023  f/u ov/[place] office/[name] re: AB maint on symbicort  160  / still smoking some  Chief Complaint  Patient presents with   Shortness of Breath  Dyspnea:  house clearing  Cough: minimal mucoid  Sleeping: flat bed 2 pillows no   resp cc  SABA use: 1-3 x per day  02: none    No obvious day to day or daytime variability or assoc excess/ purulent sputum or mucus plugs or hemoptysis or cp or chest tightness, subjective wheeze or overt  sinus or hb symptoms.    Also denies any obvious fluctuation of symptoms with weather or environmental changes or other aggravating or alleviating factors except as outlined above   No unusual exposure hx or h/o childhood pna/ asthma or knowledge of premature birth.  Current Allergies, Complete Past Medical History, Past Surgical History, Family History, and Social History were reviewed in [employer] record.  ROS  The following are not active complaints unless bolded Hoarseness, sore throat, dysphagia, dental problems, itching, sneezing,  nasal congestion or discharge of excess mucus or purulent secretions, ear ache,   fever, chills, sweats, unintended wt loss or wt gain, classically pleuritic or exertional cp,  orthopnea pnd or arm/hand swelling  or leg swelling, presyncope, palpitations, abdominal pain, anorexia, nausea, vomiting, diarrhea  or change in bowel habits or change in bladder habits, change in stools or change in urine, dysuria, hematuria,  rash, arthralgias, visual complaints, headache, numbness, weakness or ataxia or problems with walking or coordination,  change in mood/anxious  or  memory.        Current Meds  Medication Sig   acetaminophen  (TYLENOL ) 500 MG tablet Take 1,000 mg by mouth every 6 (six) hours as needed for mild pain or moderate pain.   ALPRAZolam  (XANAX ) 1 MG tablet Take 1 mg by mouth at bedtime as needed for anxiety. (Patient taking differently: Take 1 mg by mouth 2 (two) times daily as needed for anxiety.)   budesonide -formoterol  (SYMBICORT ) 160-4.5 MCG/ACT inhaler Take 2 puffs first thing in am and then another 2 puffs about 12 hours later.   famotidine  (PEPCID ) 20 MG tablet One after supper   ipratropium (ATROVENT ) 0.02 % nebulizer solution Take 2.5 mLs (0.5 mg total) by nebulization every 6 (six) hours.   levalbuterol  (XOPENEX ) 1.25 MG/0.5ML nebulizer solution Take 1.25 mg by nebulization every 6 (six) hours.   nicotine  (NICODERM CQ  -  DOSED IN MG/24 HOURS) 21 mg/24hr patch Place 1 patch (21 mg total) onto the skin daily.   oxyCODONE  (OXY IR/ROXICODONE ) 5 MG immediate release tablet Take 10 mg by mouth every 6 (six) hours as needed for severe pain (pain score 7-10).   pantoprazole  (PROTONIX ) 40 MG tablet Take 40 mg by mouth every evening.   promethazine  (PHENERGAN ) 25 MG tablet Take 1 tablet (25 mg total) by mouth every 6 (six) hours as needed for vomiting or nausea.   VENTOLIN  HFA 108 (90 Base) MCG/ACT inhaler Inhale 1-2 puffs into the lungs every 4 (four) hours as needed for wheezing or shortness of breath.            Past Medical History:  Diagnosis Date   Abdominal wall pain    chronic; per [facility] records 07/2012   Anemia    Anxiety    Chronic abdominal pain    Chronic  pancreatitis (HCC)    Depression    Gastritis    GERD (gastroesophageal reflux disease)    Headache    migraines   Hemorrhage in pregnancy    History of preterm delivery, currently pregnant in first trimester 05/05/2015   HPV in female    Hypertension    mainly during pregnancy   Nausea & vomiting 05/05/2015   Opiate dependence (HCC) 02/27/2012   Osteomyelitis of leg (HCC)    right tibia, 2009   Pancreatitis    Pancreatitis    Pneumonia    Tobacco abuse    Vaginal Pap smear, abnormal       Objective:     Wt Readings from Last 3 Encounters:  12/28/23 125 lb (56.7 kg)  11/08/23 119 lb (54 kg)  10/27/23 117 lb (53.1 kg)      Vital signs reviewed  12/28/2023  - Note at rest 02 sats  97% on RA   General appearance:    animated pleasant amb wf nad   HEENT : Oropharynx  clear       Nasal turbinates nl    NECK :  without  apparent JVD/ palpable Nodes/TM    LUNGS: no acc muscle use,  Nl contour chest which is clear to A and P bilaterally without cough on insp or exp maneuvers   CV:  RRR  no s3 or murmur or increase in P2, and no edema   ABD:  soft and nontender   MS:  Gait nl   ext warm without deformities Or obvious joint  restrictions  calf tenderness, cyanosis or clubbing    SKIN: warm and dry without lesions    NEURO:  alert, approp, nl sensorium with  no motor or cerebellar deficits apparent.          Assessment   Assessment & Plan Asthmatic bronchitis , chronic (HCC) Onset 2019  - Quit smoking early July 2025 /MM - 11/08/2023   rx  symbicort  160 2bid and more approp saba  - Allergy screen 11/08/2023 >  Eos 0.0/  IgE 232 on last days of pred taper  - Alpha one AT phenotype MM  level 167   11/08/23   - 12/28/2023   Walked on RA  x  3  lap(s) =  approx 450  ft  @ mod fast pace, stopped due to end of study  with lowest 02 sats 94% and mild sob  - 12/28/2023  After extensive coaching inhaler device,  effectiveness =    75% hfa (short Ti)  >>> symbicort  160 and more approp saba use: Re SABA :  I spent extra time with pt today reviewing appropriate use of albuterol  for prn use on exertion with the following points: 1) saba is for relief of sob that does not improve by walking a slower pace or resting but rather if the pt does not improve after trying this first. 2) If the pt is convinced, as many are, that saba helps recover from activity faster then it's easy to tell if this is the case by re-challenging : ie stop, take the inhaler, then p 5 minutes try the exact same activity (intensity of workload) that just caused the symptoms and see if they are substantially diminished or not after saba 3) if there is an activity that reproducibly causes the symptoms, try the saba 15 min before the activity on alternate days   If in fact the saba really does help, then fine to continue to use it  prn but advised may need to look closer at the maintenance regimen being used to achieve better control of airways disease with exertion.    >>> pfts/ f/u q 3 m sooner prn with option for allergy w/u next/ advise   Each maintenance medication was reviewed in detail including emphasizing most importantly the difference between  maintenance and prns and under what circumstances the prns are to be triggered using an action plan format where appropriate.  Total time for H and P, chart review, counseling, reviewing hfa device(s) , directly observing portions of ambulatory 02 saturation study/ and generating customized AVS unique to this office visit / same day charting = 40 min summary f/u ov                    AVS  Patient Instructions  Also  Ok to try albuterol  15 min before an activity (on alternating days)  that you know would usually make you short of breath and see if it makes any difference - if makes none then don't take albuterol  after activity unless you try resting first.   PFTs next available and ok to use symbicort  that morning       Please schedule a follow up visit in 3 months but call sooner if needed        Ozell America, MD 12/28/2023

## 2023-12-28 ENCOUNTER — Encounter: Payer: Self-pay | Admitting: Internal Medicine

## 2023-12-28 ENCOUNTER — Ambulatory Visit: Admitting: Internal Medicine

## 2023-12-28 VITALS — BP 141/95 | HR 110 | Ht 64.0 in | Wt 125.0 lb

## 2023-12-28 DIAGNOSIS — J4489 Other specified chronic obstructive pulmonary disease: Secondary | ICD-10-CM

## 2023-12-28 NOTE — Assessment & Plan Note (Addendum)
 Onset 2019  - Quit smoking early July 2025 /MM - 11/08/2023   rx  symbicort  160 2bid and more approp saba  - Allergy screen 11/08/2023 >  Eos 0.0/  IgE 232 on last days of pred taper  - Alpha one AT phenotype MM  level 167   11/08/23   - 12/28/2023   Walked on RA  x  3  lap(s) =  approx 450  ft  @ mod fast pace, stopped due to end of study  with lowest 02 sats 94% and mild sob  - 12/28/2023  After extensive coaching inhaler device,  effectiveness =    75% hfa (short Ti)  >>> symbicort  160 and more approp saba use: Re SABA :  I spent extra time with pt today reviewing appropriate use of albuterol  for prn use on exertion with the following points: 1) saba is for relief of sob that does not improve by walking a slower pace or resting but rather if the pt does not improve after trying this first. 2) If the pt is convinced, as many are, that saba helps recover from activity faster then it's easy to tell if this is the case by re-challenging : ie stop, take the inhaler, then p 5 minutes try the exact same activity (intensity of workload) that just caused the symptoms and see if they are substantially diminished or not after saba 3) if there is an activity that reproducibly causes the symptoms, try the saba 15 min before the activity on alternate days   If in fact the saba really does help, then fine to continue to use it prn but advised may need to look closer at the maintenance regimen being used to achieve better control of airways disease with exertion.    >>> pfts/ f/u q 3 m sooner prn with option for allergy w/u next/ advise   Each maintenance medication was reviewed in detail including emphasizing most importantly the difference between maintenance and prns and under what circumstances the prns are to be triggered using an action plan format where appropriate.  Total time for H and P, chart review, counseling, reviewing hfa device(s) , directly observing portions of ambulatory 02 saturation study/  and generating customized AVS unique to this office visit / same day charting = 40 min summary f/u ov

## 2023-12-28 NOTE — Patient Instructions (Addendum)
 Also  Ok to try albuterol  15 min before an activity (on alternating days)  that you know would usually make you short of breath and see if it makes any difference - if makes none then don't take albuterol  after activity unless you try resting first.   PFTs next available and ok to use symbicort  that morning       Please schedule a follow up visit in 3 months but call sooner if needed

## 2024-01-21 ENCOUNTER — Other Ambulatory Visit: Payer: Self-pay

## 2024-01-21 ENCOUNTER — Other Ambulatory Visit: Payer: Self-pay | Admitting: Internal Medicine

## 2024-01-21 ENCOUNTER — Inpatient Hospital Stay (HOSPITAL_COMMUNITY)
Admission: EM | Admit: 2024-01-21 | Discharge: 2024-01-23 | DRG: 193 | Disposition: A | Discharge status: Home / Self Care | Attending: Internal Medicine | Admitting: Internal Medicine

## 2024-01-21 ENCOUNTER — Encounter (HOSPITAL_COMMUNITY): Payer: Self-pay | Admitting: Emergency Medicine

## 2024-01-21 ENCOUNTER — Emergency Department (HOSPITAL_COMMUNITY)

## 2024-01-21 DIAGNOSIS — Z1152 Encounter for screening for COVID-19: Secondary | ICD-10-CM

## 2024-01-21 DIAGNOSIS — Z888 Allergy status to other drugs, medicaments and biological substances status: Secondary | ICD-10-CM

## 2024-01-21 DIAGNOSIS — I10 Essential (primary) hypertension: Secondary | ICD-10-CM | POA: Diagnosis present

## 2024-01-21 DIAGNOSIS — D72829 Elevated white blood cell count, unspecified: Secondary | ICD-10-CM

## 2024-01-21 DIAGNOSIS — Z833 Family history of diabetes mellitus: Secondary | ICD-10-CM | POA: Diagnosis not present

## 2024-01-21 DIAGNOSIS — F419 Anxiety disorder, unspecified: Secondary | ICD-10-CM | POA: Diagnosis present

## 2024-01-21 DIAGNOSIS — J4541 Moderate persistent asthma with (acute) exacerbation: Secondary | ICD-10-CM | POA: Diagnosis not present

## 2024-01-21 DIAGNOSIS — J189 Pneumonia, unspecified organism: Secondary | ICD-10-CM | POA: Diagnosis present

## 2024-01-21 DIAGNOSIS — R0902 Hypoxemia: Secondary | ICD-10-CM

## 2024-01-21 DIAGNOSIS — J9601 Acute respiratory failure with hypoxia: Secondary | ICD-10-CM | POA: Diagnosis present

## 2024-01-21 DIAGNOSIS — J45901 Unspecified asthma with (acute) exacerbation: Secondary | ICD-10-CM | POA: Diagnosis not present

## 2024-01-21 DIAGNOSIS — Z7951 Long term (current) use of inhaled steroids: Secondary | ICD-10-CM

## 2024-01-21 DIAGNOSIS — Z825 Family history of asthma and other chronic lower respiratory diseases: Secondary | ICD-10-CM | POA: Diagnosis not present

## 2024-01-21 DIAGNOSIS — Z79891 Long term (current) use of opiate analgesic: Secondary | ICD-10-CM

## 2024-01-21 DIAGNOSIS — J209 Acute bronchitis, unspecified: Secondary | ICD-10-CM | POA: Diagnosis present

## 2024-01-21 DIAGNOSIS — Z91030 Bee allergy status: Secondary | ICD-10-CM | POA: Diagnosis not present

## 2024-01-21 DIAGNOSIS — Z720 Tobacco use: Secondary | ICD-10-CM | POA: Diagnosis not present

## 2024-01-21 DIAGNOSIS — J4521 Mild intermittent asthma with (acute) exacerbation: Secondary | ICD-10-CM | POA: Diagnosis present

## 2024-01-21 DIAGNOSIS — K219 Gastro-esophageal reflux disease without esophagitis: Secondary | ICD-10-CM | POA: Diagnosis present

## 2024-01-21 DIAGNOSIS — J40 Bronchitis, not specified as acute or chronic: Secondary | ICD-10-CM

## 2024-01-21 DIAGNOSIS — F17210 Nicotine dependence, cigarettes, uncomplicated: Secondary | ICD-10-CM | POA: Diagnosis present

## 2024-01-21 DIAGNOSIS — Z818 Family history of other mental and behavioral disorders: Secondary | ICD-10-CM

## 2024-01-21 DIAGNOSIS — Z885 Allergy status to narcotic agent status: Secondary | ICD-10-CM

## 2024-01-21 DIAGNOSIS — Z79899 Other long term (current) drug therapy: Secondary | ICD-10-CM | POA: Diagnosis not present

## 2024-01-21 DIAGNOSIS — Z8249 Family history of ischemic heart disease and other diseases of the circulatory system: Secondary | ICD-10-CM

## 2024-01-21 DIAGNOSIS — J208 Acute bronchitis due to other specified organisms: Secondary | ICD-10-CM | POA: Diagnosis not present

## 2024-01-21 LAB — BASIC METABOLIC PANEL WITH GFR
Anion gap: 15 (ref 5–15)
BUN: <5 mg/dL — ABNORMAL LOW (ref 6–20)
CO2: 22 mmol/L (ref 22–32)
Calcium: 9.5 mg/dL (ref 8.9–10.3)
Chloride: 100 mmol/L (ref 98–111)
Creatinine, Ser: 0.8 mg/dL (ref 0.44–1.00)
GFR, Estimated: >60 mL/min (ref >60)
Glucose, Bld: 127 mg/dL — ABNORMAL HIGH (ref 70–99)
Potassium: 4.1 mmol/L (ref 3.5–5.1)
Sodium: 137 mmol/L (ref 135–145)

## 2024-01-21 LAB — CBC
HCT: 40 % (ref 36.0–46.0)
Hemoglobin: 13.6 g/dL (ref 12.0–15.0)
MCH: 31.9 pg (ref 26.0–34.0)
MCHC: 34 g/dL (ref 30.0–36.0)
MCV: 93.9 fL (ref 80.0–100.0)
Platelets: 346 K/uL (ref 150–400)
RBC: 4.26 MIL/uL (ref 3.87–5.11)
RDW: 13.6 % (ref 11.5–15.5)
WBC: 16.6 K/uL — ABNORMAL HIGH (ref 4.0–10.5)
nRBC: 0 % (ref 0.0–0.2)

## 2024-01-21 LAB — PHOSPHORUS: Phosphorus: 2.5 mg/dL (ref 2.5–4.6)

## 2024-01-21 LAB — RESP PANEL BY RT-PCR (RSV, FLU A&B, COVID)  RVPGX2
Influenza A by PCR: NEGATIVE
Influenza B by PCR: NEGATIVE
Resp Syncytial Virus by PCR: NEGATIVE
SARS Coronavirus 2 by RT PCR: NEGATIVE

## 2024-01-21 LAB — TROPONIN T, HIGH SENSITIVITY
Troponin T High Sensitivity: <15 ng/L (ref 0–19)
Troponin T High Sensitivity: <15 ng/L (ref 0–19)

## 2024-01-21 LAB — MAGNESIUM: Magnesium: 2.9 mg/dL — ABNORMAL HIGH (ref 1.7–2.4)

## 2024-01-21 MED ORDER — METHYLPREDNISOLONE SODIUM SUCC 125 MG IJ SOLR
125.0000 mg | Freq: Once | INTRAMUSCULAR | Status: AC
  Administered 2024-01-21: 125 mg via INTRAVENOUS
  Filled 2024-01-21: qty 2

## 2024-01-21 MED ORDER — MONTELUKAST SODIUM 10 MG PO TABS
10.0000 mg | ORAL_TABLET | Freq: Every day | ORAL | Status: DC
  Administered 2024-01-21 – 2024-01-22 (×2): 10 mg via ORAL
  Filled 2024-01-21 (×2): qty 1

## 2024-01-21 MED ORDER — SODIUM CHLORIDE 0.9 % IV SOLN
500.0000 mg | INTRAVENOUS | Status: DC
  Administered 2024-01-21: 500 mg via INTRAVENOUS
  Filled 2024-01-21: qty 5

## 2024-01-21 MED ORDER — IPRATROPIUM BROMIDE 0.02 % IN SOLN
0.5000 mg | Freq: Four times a day (QID) | RESPIRATORY_TRACT | Status: DC
Start: 2024-01-21 — End: 2024-01-23
  Administered 2024-01-21 – 2024-01-23 (×9): 0.5 mg via RESPIRATORY_TRACT
  Filled 2024-01-21 (×10): qty 2.5

## 2024-01-21 MED ORDER — ENOXAPARIN SODIUM 40 MG/0.4ML IJ SOSY
40.0000 mg | PREFILLED_SYRINGE | INTRAMUSCULAR | Status: DC
  Filled 2024-01-21 (×2): qty 0.4

## 2024-01-21 MED ORDER — METHYLPREDNISOLONE SODIUM SUCC 40 MG IJ SOLR
40.0000 mg | Freq: Two times a day (BID) | INTRAMUSCULAR | Status: DC
  Administered 2024-01-21: 40 mg via INTRAVENOUS
  Filled 2024-01-21: qty 1

## 2024-01-21 MED ORDER — ACETAMINOPHEN 325 MG PO TABS: 650.0000 mg | ORAL_TABLET | Freq: Four times a day (QID) | ORAL | Status: DC | PRN

## 2024-01-21 MED ORDER — BUDESONIDE 0.25 MG/2ML IN SUSP
0.2500 mg | Freq: Two times a day (BID) | RESPIRATORY_TRACT | Status: DC
  Administered 2024-01-21 – 2024-01-23 (×5): 0.25 mg via RESPIRATORY_TRACT
  Filled 2024-01-21 (×5): qty 2

## 2024-01-21 MED ORDER — ONDANSETRON HCL 4 MG PO TABS
4.0000 mg | ORAL_TABLET | Freq: Four times a day (QID) | ORAL | Status: DC | PRN
  Administered 2024-01-22 (×3): 4 mg via ORAL
  Filled 2024-01-21 (×3): qty 1

## 2024-01-21 MED ORDER — ONDANSETRON HCL 4 MG/2ML IJ SOLN
4.0000 mg | Freq: Four times a day (QID) | INTRAMUSCULAR | Status: DC | PRN
  Administered 2024-01-21 – 2024-01-22 (×2): 4 mg via INTRAVENOUS
  Filled 2024-01-21 (×2): qty 2

## 2024-01-21 MED ORDER — ALPRAZOLAM 1 MG PO TABS
1.0000 mg | ORAL_TABLET | Freq: Every evening | ORAL | Status: DC | PRN
  Administered 2024-01-21 – 2024-01-22 (×3): 1 mg via ORAL
  Filled 2024-01-21: qty 2
  Filled 2024-01-21 (×2): qty 1

## 2024-01-21 MED ORDER — METHYLPREDNISOLONE SODIUM SUCC 40 MG IJ SOLR
40.0000 mg | Freq: Two times a day (BID) | INTRAMUSCULAR | Status: DC
  Administered 2024-01-21 – 2024-01-23 (×5): 40 mg via INTRAVENOUS
  Filled 2024-01-21 (×5): qty 1

## 2024-01-21 MED ORDER — IPRATROPIUM-ALBUTEROL 0.5-2.5 (3) MG/3ML IN SOLN
3.0000 mL | Freq: Once | RESPIRATORY_TRACT | Status: AC
  Administered 2024-01-21: 3 mL via RESPIRATORY_TRACT
  Filled 2024-01-21: qty 3

## 2024-01-21 MED ORDER — IPRATROPIUM BROMIDE 0.02 % IN SOLN: 0.5000 mg | RESPIRATORY_TRACT | Status: DC | PRN

## 2024-01-21 MED ORDER — PANTOPRAZOLE SODIUM 40 MG PO TBEC
40.0000 mg | DELAYED_RELEASE_TABLET | Freq: Every day | ORAL | Status: DC
  Administered 2024-01-21 – 2024-01-23 (×3): 40 mg via ORAL
  Filled 2024-01-21 (×3): qty 1

## 2024-01-21 MED ORDER — SODIUM CHLORIDE 0.9 % IV BOLUS
1000.0000 mL | Freq: Once | INTRAVENOUS | Status: AC
Start: 2024-01-21 — End: 2024-01-21
  Administered 2024-01-21: 1000 mL via INTRAVENOUS

## 2024-01-21 MED ORDER — ACETAMINOPHEN 650 MG RE SUPP: 650.0000 mg | Freq: Four times a day (QID) | RECTAL | Status: DC | PRN

## 2024-01-21 MED ORDER — AZITHROMYCIN 250 MG PO TABS: 250.0000 mg | ORAL_TABLET | Freq: Every day | ORAL | Status: DC

## 2024-01-21 MED ORDER — OXYCODONE HCL 5 MG PO TABS
10.0000 mg | ORAL_TABLET | Freq: Four times a day (QID) | ORAL | Status: DC | PRN
  Administered 2024-01-21 – 2024-01-23 (×10): 10 mg via ORAL
  Filled 2024-01-21 (×10): qty 2

## 2024-01-21 MED ORDER — GUAIFENESIN-DM 100-10 MG/5ML PO SYRP
10.0000 mL | ORAL_SOLUTION | Freq: Three times a day (TID) | ORAL | Status: DC
  Administered 2024-01-21 – 2024-01-23 (×6): 10 mL via ORAL
  Filled 2024-01-21 (×7): qty 10

## 2024-01-21 MED ORDER — DM-GUAIFENESIN ER 30-600 MG PO TB12: 1.0000 | ORAL_TABLET | Freq: Two times a day (BID) | ORAL | Status: DC

## 2024-01-21 MED ORDER — SODIUM CHLORIDE 0.9 % IV SOLN
2.0000 g | Freq: Once | INTRAVENOUS | Status: AC
  Administered 2024-01-21: 2 g via INTRAVENOUS
  Filled 2024-01-21: qty 20

## 2024-01-21 MED ORDER — LEVALBUTEROL HCL 1.25 MG/0.5ML IN NEBU
1.2500 mg | INHALATION_SOLUTION | Freq: Four times a day (QID) | RESPIRATORY_TRACT | Status: DC
  Administered 2024-01-21 – 2024-01-23 (×9): 1.25 mg via RESPIRATORY_TRACT
  Filled 2024-01-21 (×9): qty 0.5

## 2024-01-21 MED ORDER — DM-GUAIFENESIN ER 30-600 MG PO TB12
2.0000 | ORAL_TABLET | Freq: Two times a day (BID) | ORAL | Status: DC
  Administered 2024-01-21: 2 via ORAL
  Filled 2024-01-21: qty 2

## 2024-01-21 MED ORDER — NICOTINE 21 MG/24HR TD PT24
21.0000 mg | MEDICATED_PATCH | Freq: Every day | TRANSDERMAL | Status: DC
Start: 2024-01-22 — End: 2024-01-23
  Administered 2024-01-22 – 2024-01-23 (×2): 21 mg via TRANSDERMAL
  Filled 2024-01-21 (×2): qty 1

## 2024-01-21 MED ORDER — AZITHROMYCIN 250 MG PO TABS
500.0000 mg | ORAL_TABLET | Freq: Every day | ORAL | Status: DC
  Administered 2024-01-21 – 2024-01-22 (×2): 500 mg via ORAL
  Filled 2024-01-21 (×2): qty 2

## 2024-01-21 MED ORDER — MAGNESIUM SULFATE 2 GM/50ML IV SOLN
2.0000 g | Freq: Once | INTRAVENOUS | Status: AC
  Administered 2024-01-21: 2 g via INTRAVENOUS
  Filled 2024-01-21: qty 50

## 2024-01-21 NOTE — Plan of Care (Signed)

## 2024-01-21 NOTE — Hospital Course (Addendum)
 [name] is a 38 y.o. female with medical history significant of intermittent asthma, recurrent bronchitis, tobacco abuse, history of preeclampsia who presents to the emergency department due to shortness of breath which started yesterday, she complained of productive cough with clear mucus.  Patient complained of worsening shortness of breath on mild exertion such as walking a few steps within the house.  She was at [place]  when symptoms started, she returned home today and went straight to the ED for further evaluation.  She denies fever, nausea and vomiting.  She complained of chest tightness and congestion.   ED Course:  she was tachycardic, tachypneic, temperature 98.8 F, BP 115/86, O2 sat 92% on supplemental oxygen  via [place] at 2 LPM.  Workup in the ED showed normal CBC except for WBC of 16.6.  BMP was normal except for blood glucose of 127.  Troponin x 2 was negative.  Influenza A, B, SARS, RSV, RSV was negative. Chest x-ray showed no active disease She was treated with IV ceftriaxone  and Zithromax  due to presumed CAP.  Breathing treatment was provided, IV Solu-Medrol  125 mg x 1 was given, IV NS 1 L was provided.      Assessment & Plan:   Principal Problem:   Acute bronchitis Active Problems:   Tobacco abuse   Acute asthma exacerbation   Acute respiratory failure with hypoxia (HCC)     Acute bronchitis with superimposed asthma exacerbation Acute respiratory failure with hypoxia -  - Continue Atrovent , Mucinex , Solu-Medrol , azithromycin . - Continue Protonix  to prevent steroid-induced ulcer - Continue incentive spirometry and flutter valve - Continue supplemental oxygen  to maintain O2 sat > 92% with -  - plan to wean patient off oxygen  as tolerated (patient does not use - oxygen  at baseline)   - Leukocytosis (chronic) WBC 16.6, this was 14.1 about 2 months ago Continue to monitor WBC with morning labs   - Tobacco abuse Patient states that she is trying to abstain  from tobacco use.  Last smoking was 2 days ago Patient states that she has a nicotine  patch on Continue nicotine  patch

## 2024-01-21 NOTE — ED Notes (Signed)
 Respiratory at bedside.
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
## 2024-01-21 NOTE — ED Notes (Signed)
 Melissa, RN reports ready to receive pt from ED

## 2024-01-21 NOTE — TOC Initial Note (Signed)
 Transition of Care [facility]) - Initial/Assessment Note    Patient Details  Name: [name] MRN: [id number] Date of Birth: 06-02-1985  [facility] CM/SW Contact:    Lucie Lunger, LCSWA Phone Number: 01/21/2024, 11:20 AM  Clinical Narrative:                 Pt is high risk for readmission. CSW spoke with pt at bedside to complete assessment. Pt lives with her spouse and children. Pt is independent in completing his ADLs and able to drive when needed. Pt states that she has a neb machine at home. Pt states that she does not wear O2 at baseline. TOC  to follow.    Barriers to Discharge: Continued Medical Work up   Patient Goals and CMS Choice Patient states their goals for this hospitalization and ongoing recovery are:: from home CMS Medicare.gov Compare Post Acute Care list provided to:: Patient Choice offered to / list presented to : Patient      Expected Discharge Plan and Services In-house Referral: Clinical Social Work Discharge Planning Services: CM Consult   Living arrangements for the past 2 months: Single Family Home                                      Prior Living Arrangements/Services Living arrangements for the past 2 months: Single Family Home Lives with:: Spouse, Minor Children Patient language and need for interpreter reviewed:: Yes Do you feel safe going back to the place where you live?: Yes      Need for Family Participation in Patient Care: Yes (Comment) Care giver support system in place?: Yes (comment)   Criminal Activity/Legal Involvement Pertinent to Current Situation/Hospitalization: No - Comment as needed  Activities of Daily Living   ADL Screening (condition at time of admission) Independently performs ADLs?: Yes (appropriate for developmental age) Is the patient deaf or have difficulty hearing?: No Does the patient have difficulty seeing, even when wearing glasses/contacts?: No Does the patient have difficulty  concentrating, remembering, or making decisions?: No  Permission Sought/Granted                  Emotional Assessment Appearance:: Appears stated age Attitude/Demeanor/Rapport: Engaged Affect (typically observed): Accepting Orientation: : Oriented to Self, Oriented to Place, Oriented to  Time, Oriented to Situation Alcohol / Substance Use: Not Applicable Psych Involvement: No (comment)  Admission diagnosis:  Acute bronchitis [J20.9] Bronchitis [J40] Hypoxia [R09.02] Exacerbation of asthma, unspecified asthma severity, unspecified whether persistent [J45.901] Patient Active Problem List   Diagnosis Date Noted   Acute bronchitis 01/21/2024   Acute respiratory failure with hypoxia (HCC) 01/21/2024   Asthmatic bronchitis , chronic (HCC) 11/08/2023   Acute asthma exacerbation 10/28/2023   Asthma, chronic, unspecified asthma severity, with acute exacerbation 10/27/2023   Leukocytosis 10/27/2023   Hyponatremia 10/27/2023   GERD (gastroesophageal reflux disease) 10/27/2023   ERRONEOUS ENCOUNTER--DISREGARD 04/04/2023   Sinus tachycardia 10/05/2022   POTS (postural orthostatic tachycardia syndrome) 10/05/2022   Loose stools 08/16/2022   Abdominal pain, epigastric 08/16/2022   Intractable nausea and vomiting 09/16/2018   Elevated serum alkaline phosphatase level    Malnutrition of moderate degree 08/16/2017   Essential hypertension 08/15/2017   Pancreatitis 08/15/2017   Acute on chronic pancreatitis (HCC) 08/15/2017   Chronic pain syndrome 08/15/2017   Acute recurrent pancreatitis    Hyperglycemia 06/13/2017   Dysmenorrhea 02/05/2016   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   Placental abruption in third trimester 12/09/2015   Fetal demise 12/02/2015   Chronic recurrent pancreatitis (HCC) 11/12/2015   Substance abuse affecting pregnancy, antepartum (HCC) 11/05/2015   Hyperemesis gravidarum 06/01/2015   ASCUS with positive high risk HPV cervical 05/24/2015   Preterm delivery  10/14/2014   Depression with anxiety 10/14/2014   History of placenta abruption 08/04/2014   Chronic pancreatitis (HCC) 07/04/2014   Prior pregnancy with fetal demise, antepartum 06/25/2014   Pap smear abnormality of cervix with ASCUS favoring dysplasia 04/08/2014   Rh negative state in antepartum period 02/13/2014   Marijuana use 02/13/2014   Hematemesis 11/04/2012   Sleep disturbance 08/01/2012   Generalized anxiety disorder 08/01/2012   Opiate dependence (HCC) 02/27/2012   Tobacco abuse 04/13/2011   Pancreas divisum 07/23/2010   Anemia 07/23/2010   PCP:  [facility] Pharmacy:   [facility] - [place], [place] - [address] [place] [address] Phone: [phone] Fax: [phone]  [facility] [phone] - [place], [place] - [address] AT [facility] [address] [place] [address] Phone: [phone] Fax: [phone]     Social Drivers of Health (SDOH) Social History: SDOH Screenings   Food Insecurity: No Food Insecurity (01/21/2024)  Housing: Low Risk  (01/21/2024)  Transportation Needs: No Transportation Needs (01/21/2024)  Utilities: Not At Risk (01/21/2024)  Alcohol Screen: Low Risk  (11/05/2019)  Depression (PHQ2-9): Medium Risk (11/05/2019)  Financial Resource Strain: Low Risk  (11/05/2019)  Physical Activity: Insufficiently Active (11/05/2019)  Social Connections: Unknown (09/12/2022)   Received from [facility]  Stress: Stress Concern Present (09/14/2022)   Received from [facility]  Tobacco Use: High Risk (01/21/2024)   SDOH Interventions:     Readmission Risk Interventions    01/21/2024   11:18 AM  Readmission Risk Prevention Plan  Transportation Screening Complete  Home Care Screening Complete  Medication Review (RN CM) Complete

## 2024-01-21 NOTE — Progress Notes (Signed)
 PROGRESS NOTE    Patient: [name]                            PCP: [facility]                    DOB: 12-08-1985            DOA: 01/21/2024 FMW:[id number]             DOS: 01/21/2024, 7:21 AM   LOS: 0 days   Date of Service: The patient was seen and examined on 01/21/2024  Subjective:   The patient was seen and examined this morning. Still requiring 2 L of oxygen , satting 98%, not O2 dependent at baseline Audible wheezing, coughing-shortness of breath with exertion   Brief Narrative:   [name] is a 38 y.o. female with medical history significant of intermittent asthma, recurrent bronchitis, tobacco abuse, history of preeclampsia who presents to the emergency department due to shortness of breath which started yesterday, she complained of productive cough with clear mucus.  Patient complained of worsening shortness of breath on mild exertion such as walking a few steps within the house.  She was at [place]  when symptoms started, she returned home today and went straight to the ED for further evaluation.  She denies fever, nausea and vomiting.  She complained of chest tightness and congestion.   ED Course:  she was tachycardic, tachypneic, temperature 98.8 F, BP 115/86, O2 sat 92% on supplemental oxygen  via [place] at 2 LPM.  Workup in the ED showed normal CBC except for WBC of 16.6.  BMP was normal except for blood glucose of 127.  Troponin x 2 was negative.  Influenza A, B, SARS, RSV, RSV was negative. Chest x-ray showed no active disease She was treated with IV ceftriaxone  and Zithromax  due to presumed CAP.  Breathing treatment was provided, IV Solu-Medrol  125 mg x 1 was given, IV NS 1 L was provided.      Assessment & Plan:   Principal Problem:   Acute bronchitis Active Problems:   Tobacco abuse   Acute asthma exacerbation   Acute respiratory failure with hypoxia (HCC)     Acute bronchitis with superimposed asthma exacerbation Acute  respiratory failure with hypoxia - Continue require 2 L of oxygen , satting 98% tapering off - Still wheezing, significant, with cough congestion shortness of with minimal exertion  - Continue nebs (Atrovent /Xopenex ) every 6 hours, Pulmicort  every 12 hours -IV Solu-Medrol , azithromycin  . - Continue Protonix  to prevent steroid-induced ulcer - Continue incentive spirometry and flutter valve - Continue supplemental oxygen  to maintain O2 sat > 92% with -  - plan to wean patient off oxygen  as tolerated (patient does not use - oxygen  at baseline)   Leukocytosis (chronic)/exacerbated by asthma/bronchitis WBC 16.6, this was 14.1 about 2 months ago Continue to monitor WBC with morning labs Continue p.o. azithromycin    Tobacco abuse Patient states that she is trying to abstain from tobacco use.  Last smoking was 2 days ago Patient states that she has a nicotine  patch on Continue nicotine  patch  ---------------------------------------------------------------------------------------------------------------------------------------- Nutritional status:  The patient's BMI is: Body mass index is 20.94 kg/m. I agree with the assessment and plan as outlined -----------------------------------------------------------------------------------------------------------------------------------------  DVT prophylaxis:  enoxaparin  (LOVENOX ) injection 40 mg Start: 01/21/24 1000 SCDs Start: 01/21/24 0415   Code Status:   Code Status: Full Code  Family Communication: No family member  present at bedside-  -Advance care planning has been discussed.   Admission status:   Status is: Inpatient Remains inpatient appropriate because: Needing respiratory support, and acute respiratory failure   Disposition: From  - home             Planning for discharge in 1-2 days   Procedures:   No admission procedures for hospital encounter.   Antimicrobials:  Anti-infectives (From admission, onward)    Start      Dose/Rate Route Frequency Ordered Stop   01/21/24 0502  azithromycin  (ZITHROMAX ) tablet 250 mg        250 mg Oral Daily 01/21/24 0504 01/25/24 0959   01/21/24 0145  cefTRIAXone  (ROCEPHIN ) 2 g in sodium chloride  0.9 % 100 mL IVPB        2 g 200 mL/hr over 30 Minutes Intravenous  Once 01/21/24 0136 01/21/24 0248   01/21/24 0045  azithromycin  (ZITHROMAX ) 500 mg in sodium chloride  0.9 % 250 mL IVPB        500 mg 250 mL/hr over 60 Minutes Intravenous Every 24 hours 01/21/24 0040          Medication:   azithromycin   250 mg Oral Daily   dextromethorphan -guaiFENesin   1 tablet Oral BID   enoxaparin  (LOVENOX ) injection  40 mg Subcutaneous Q24H   ipratropium  0.5 mg Nebulization Q6H   levalbuterol   1.25 mg Nebulization Q6H   methylPREDNISolone  (SOLU-MEDROL ) injection  40 mg Intravenous Q12H   [START ON 01/22/2024] nicotine   21 mg Transdermal Daily   pantoprazole   40 mg Oral Daily    acetaminophen  **OR** acetaminophen , ALPRAZolam , ipratropium, ondansetron  **OR** ondansetron  (ZOFRAN ) IV, oxyCODONE    Objective:   Vitals:   01/21/24 0145 01/21/24 0305 01/21/24 0415 01/21/24 0517  BP:  (!) 133/94  112/72  Pulse: (!) 112 (!) 122  97  Resp: 20 17  12   Temp:  98.4 F (36.9 C)  98.3 F (36.8 C)  TempSrc:  Oral  Oral  SpO2: 95% 94% 97% 97%  Weight:      Height:        Intake/Output Summary (Last 24 hours) at 01/21/2024 0721 Last data filed at 01/21/2024 0540 Gross per 24 hour  Intake 400 ml  Output --  Net 400 ml   Filed Weights   01/21/24 0019  Weight: 55.3 kg     Physical examination:   General:  AAO x 3,  cooperative, no distress;   HEENT:  Normocephalic, PERRL, otherwise with in Normal limits   Neuro:  CNII-XII intact. , normal motor and sensation, reflexes intact   Lungs:   Diffuse wheezing, rhonchi, no crackles Reduced breath sounds mid to lower lobe bilaterally  Cardio:    S1/S2, RRR, No murmure, No Rubs or Gallops   Abdomen:  Soft, non-tender, bowel sounds active all  four quadrants, no guarding or peritoneal signs.  Muscular  skeletal:  Limited exam -global generalized weaknesses - in bed, able to move all 4 extremities,   2+ pulses,  symmetric, No pitting edema  Skin:  Dry, warm to touch, negative for any Rashes,  Wounds: Please see nursing documentation       ------------------------------------------------------------------------------------------------------------------------------------------    LABs:     Latest Ref Rng & Units 01/21/2024   12:32 AM 11/08/2023    3:04 PM 10/28/2023    2:35 AM  CBC  WBC 4.0 - 10.5 K/uL 16.6  14.1  14.9   Hemoglobin 12.0 - 15.0 g/dL 86.3  85.6  85.6   Hematocrit  36.0 - 46.0 % 40.0  43.3  42.3   Platelets 150 - 400 K/uL 346  396  277       Latest Ref Rng & Units 01/21/2024   12:32 AM 10/28/2023    2:35 AM 10/27/2023   12:24 PM  CMP  Glucose 70 - 99 mg/dL 872  838  842   BUN 6 - 20 mg/dL 5  10  9    Creatinine 0.44 - 1.00 mg/dL 9.19  9.18  9.20   Sodium 135 - 145 mmol/L 137  136  133   Potassium 3.5 - 5.1 mmol/L 4.1  4.6  4.8   Chloride 98 - 111 mmol/L 100  103  106   CO2 22 - 32 mmol/L 22  21  20    Calcium  8.9 - 10.3 mg/dL 9.5  9.6  9.1        Micro Results Recent Results (from the past 240 hours)  Resp panel by RT-PCR (RSV, Flu A&B, Covid) Anterior Nasal Swab     Status: None   Collection Time: 01/21/24 12:59 AM   Specimen: Anterior Nasal Swab  Result Value Ref Range Status   SARS Coronavirus 2 by RT PCR NEGATIVE NEGATIVE Final    Comment: (NOTE) SARS-CoV-2 target nucleic acids are NOT DETECTED.  The SARS-CoV-2 RNA is generally detectable in upper respiratory specimens during the acute phase of infection. The lowest concentration of SARS-CoV-2 viral copies this assay can detect is 138 copies/mL. A negative result does not preclude SARS-Cov-2 infection and should not be used as the sole basis for treatment or other patient management decisions. A negative result may occur with  improper  specimen collection/handling, submission of specimen other than nasopharyngeal swab, presence of viral mutation(s) within the areas targeted by this assay, and inadequate number of viral copies(<138 copies/mL). A negative result must be combined with clinical observations, patient history, and epidemiological information. The expected result is Negative.  Fact Sheet for Patients:  BloggerCourse.com  Fact Sheet for Healthcare Providers:  SeriousBroker.it  This test is no t yet approved or cleared by the [place]  FDA and  has been authorized for detection and/or diagnosis of SARS-CoV-2 by FDA under an Emergency Use Authorization (EUA). This EUA will remain  in effect (meaning this test can be used) for the duration of the COVID-19 declaration under Section 564(b)(1) of the Act, 21 U.S.C.section 360bbb-3(b)(1), unless the authorization is terminated  or revoked sooner.       Influenza A by PCR NEGATIVE NEGATIVE Final   Influenza B by PCR NEGATIVE NEGATIVE Final    Comment: (NOTE) The Xpert Xpress SARS-CoV-2/FLU/RSV plus assay is intended as an aid in the diagnosis of influenza from Nasopharyngeal swab specimens and should not be used as a sole basis for treatment. Nasal washings and aspirates are unacceptable for Xpert Xpress SARS-CoV-2/FLU/RSV testing.  Fact Sheet for Patients: BloggerCourse.com  Fact Sheet for Healthcare Providers: SeriousBroker.it  This test is not yet approved or cleared by the [place]  FDA and has been authorized for detection and/or diagnosis of SARS-CoV-2 by FDA under an Emergency Use Authorization (EUA). This EUA will remain in effect (meaning this test can be used) for the duration of the COVID-19 declaration under Section 564(b)(1) of the Act, 21 U.S.C. section 360bbb-3(b)(1), unless the authorization is terminated or revoked.     Resp  Syncytial Virus by PCR NEGATIVE NEGATIVE Final    Comment: (NOTE) Fact Sheet for Patients: BloggerCourse.com  Fact Sheet for Healthcare Providers: SeriousBroker.it  This  test is not yet approved or cleared by the [place]  FDA and has been authorized for detection and/or diagnosis of SARS-CoV-2 by FDA under an Emergency Use Authorization (EUA). This EUA will remain in effect (meaning this test can be used) for the duration of the COVID-19 declaration under Section 564(b)(1) of the Act, 21 U.S.C. section 360bbb-3(b)(1), unless the authorization is terminated or revoked.  Performed at [facility], [address] [place], [place] [address]     Radiology Reports DG Chest Portable 1 View Result Date: 01/21/2024 CLINICAL DATA:  Shortness of breath and cough. EXAM: PORTABLE CHEST 1 VIEW COMPARISON:  October 27, 2023 FINDINGS: The heart size and mediastinal contours are within normal limits. Both lungs are clear. The visualized skeletal structures are unremarkable. IMPRESSION: No active disease. Electronically Signed   By: Suzen Dials M.D.   On: 01/21/2024 00:48    SIGNED: Adriana DELENA Grams, MD, FHM. FAAFP. [place] [facility] Time spent - 55 min.  In seeing, evaluating and examining the patient. Reviewing medical records, labs, drawn plan of care. [facility],  Pager (please use [email or web address] to page/ text) Please use Epic Secure Chat for non-urgent communication (7AM-7PM)  If 7PM-7AM, please contact night-coverage [email or web address], 01/21/2024, 7:21 AM

## 2024-01-21 NOTE — Plan of Care (Signed)
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
## 2024-01-21 NOTE — ED Triage Notes (Signed)
 Pt with c/o SOB that has gotten worse. Pt states she has been monitoring her O2 and can't keep it above 80%. Pt also c/o productive cough with yellow/brown sputum that started this am. Pt has been using her Albuterol  inhaler without relief of symptoms.

## 2024-01-22 LAB — CBC
HCT: 36.6 % (ref 36.0–46.0)
Hemoglobin: 12.2 g/dL (ref 12.0–15.0)
MCH: 31.6 pg (ref 26.0–34.0)
MCHC: 33.3 g/dL (ref 30.0–36.0)
MCV: 94.8 fL (ref 80.0–100.0)
Platelets: 324 K/uL (ref 150–400)
RBC: 3.86 MIL/uL — ABNORMAL LOW (ref 3.87–5.11)
RDW: 13.6 % (ref 11.5–15.5)
WBC: 13.7 K/uL — ABNORMAL HIGH (ref 4.0–10.5)
nRBC: 0 % (ref 0.0–0.2)

## 2024-01-22 LAB — COMPREHENSIVE METABOLIC PANEL WITH GFR
ALT: 19 U/L (ref 0–44)
AST: 13 U/L — ABNORMAL LOW (ref 15–41)
Albumin: 3.9 g/dL (ref 3.5–5.0)
Alkaline Phosphatase: 84 U/L (ref 38–126)
Anion gap: 12 (ref 5–15)
BUN: 9 mg/dL (ref 6–20)
CO2: 24 mmol/L (ref 22–32)
Calcium: 9.6 mg/dL (ref 8.9–10.3)
Chloride: 102 mmol/L (ref 98–111)
Creatinine, Ser: 0.76 mg/dL (ref 0.44–1.00)
GFR, Estimated: >60 mL/min (ref >60)
Glucose, Bld: 182 mg/dL — ABNORMAL HIGH (ref 70–99)
Potassium: 4.6 mmol/L (ref 3.5–5.1)
Sodium: 137 mmol/L (ref 135–145)
Total Bilirubin: 0.3 mg/dL (ref 0.0–1.2)
Total Protein: 6.7 g/dL (ref 6.5–8.1)

## 2024-01-22 NOTE — Plan of Care (Signed)

## 2024-01-22 NOTE — Progress Notes (Signed)
 Progress Note   Patient: [name] FMW:[id number] DOB: 02/17/1986 DOA: 01/21/2024     1 DOS: the patient was seen and examined on 01/22/2024   Brief hospital course: [name] is a 38 y.o. female with medical history significant of intermittent asthma, recurrent bronchitis, tobacco abuse, history of preeclampsia who presents to the emergency department due to shortness of breath which started yesterday, she complained of productive cough with clear mucus.  Patient complained of worsening shortness of breath on mild exertion such as walking a few steps within the house.  She was at [place]  when symptoms started, she returned home today and went straight to the ED for further evaluation.  She denies fever, nausea and vomiting.  She complained of chest tightness and congestion.   ED Course:  she was tachycardic, tachypneic, temperature 98.8 F, BP 115/86, O2 sat 92% on supplemental oxygen  via [place] at 2 LPM.  Workup in the ED showed normal CBC except for WBC of 16.6.  BMP was normal except for blood glucose of 127.  Troponin x 2 was negative.  Influenza A, B, SARS, RSV, RSV was negative. Chest x-ray showed no active disease She was treated with IV ceftriaxone  and Zithromax  due to presumed CAP.  Breathing treatment was provided, IV Solu-Medrol  125 mg x 1 was given, IV NS 1 L was provided.      Assessment & Plan:   Principal Problem:   Acute bronchitis Active Problems:   Tobacco abuse   Acute asthma exacerbation   Acute respiratory failure with hypoxia (HCC)  Acute bronchitis with superimposed asthma exacerbation Acute respiratory failure with hypoxia Patient still is wheezing, has dyspnea with exertion, requiring 2 L supplemental oxygen . Continue IV Solu-Medrol  40 mg every 12 hourly. Continue Atrovent , Mucinex ,  azithromycin  therapy Continue Protonix  to prevent steroid-induced ulcer Continue incentive spirometry and flutter valve Continue to wean supplemental oxygen  to  maintain O2 sat > 92%.   Leukocytosis (chronic) Possibly in the setting of IV steroid use. Continue to trend WBC   Tobacco abuse Advised to abstain from tobacco use.  Last smoking was 2 days ago Continue nicotine  patch.      Out of bed to chair. Incentive spirometry. Nursing supportive care. Fall, aspiration precautions. Diet:  Diet Orders (From admission, onward)     Start     Ordered   01/21/24 0416  Diet Heart Room service appropriate? Yes; Fluid consistency: Thin  Diet effective now       Question Answer Comment  Room service appropriate? Yes   Fluid consistency: Thin      01/21/24 0416           DVT prophylaxis: enoxaparin  (LOVENOX ) injection 40 mg Start: 01/21/24 1000 SCDs Start: 01/21/24 0415  Level of care: Med-Surg   Code Status: Full Code  Subjective: Patient is seen and examined today morning.  She is lying in bed, has mild respiratory distress, on 2 L supplemental oxygen .  Able to get out of bed.  Physical Exam: Vitals:   01/22/24 0248 01/22/24 0615 01/22/24 0817 01/22/24 0821  BP:  123/88    Pulse:  99    Resp:  18    Temp:  98 F (36.7 C)    TempSrc:  Oral    SpO2: 95% 96% 98% 98%  Weight:      Height:        General -  Young Caucasian female, in mild respiratory distress HEENT - PERRLA, EOMI, atraumatic head, non tender sinuses. Lung -  Clear, diffuse wheezes.  Using accessory muscles Heart - S1, S2 heard, no murmurs, rubs, no pedal edema. Abdomen - Soft, non tender, bowel sounds good Neuro - Alert, awake and oriented x 3, non focal exam. Skin - Warm and dry.  Data Reviewed:      Latest Ref Rng & Units 01/22/2024    4:49 AM 01/21/2024   12:32 AM 11/08/2023    3:04 PM  CBC  WBC 4.0 - 10.5 K/uL 13.7  16.6  14.1   Hemoglobin 12.0 - 15.0 g/dL 87.7  86.3  85.6   Hematocrit 36.0 - 46.0 % 36.6  40.0  43.3   Platelets 150 - 400 K/uL 324  346  396       Latest Ref Rng & Units 01/22/2024    4:49 AM 01/21/2024   12:32 AM 10/28/2023    2:35  AM  BMP  Glucose 70 - 99 mg/dL 817  872  838   BUN 6 - 20 mg/dL 9  <5  10   Creatinine 0.44 - 1.00 mg/dL 9.23  9.19  9.18   Sodium 135 - 145 mmol/L 137  137  136   Potassium 3.5 - 5.1 mmol/L 4.6  4.1  4.6   Chloride 98 - 111 mmol/L 102  100  103   CO2 22 - 32 mmol/L 24  22  21    Calcium  8.9 - 10.3 mg/dL 9.6  9.5  9.6    DG Chest Portable 1 View Result Date: 01/21/2024 CLINICAL DATA:  Shortness of breath and cough. EXAM: PORTABLE CHEST 1 VIEW COMPARISON:  October 27, 2023 FINDINGS: The heart size and mediastinal contours are within normal limits. Both lungs are clear. The visualized skeletal structures are unremarkable. IMPRESSION: No active disease. Electronically Signed   By: Suzen Dials M.D.   On: 01/21/2024 00:48    Family Communication: Discussed with patient, understand and agree. All questions answered.  Disposition: Status is: Inpatient Remains inpatient appropriate because: IV steroids, wean oxygen   Planned Discharge Destination: Home     Time spent: 42 minutes  Author: Concepcion Riser, MD 01/22/2024 2:04 PM Secure chat 7am to 7pm For on call review [email or web address].

## 2024-01-23 DIAGNOSIS — J4541 Moderate persistent asthma with (acute) exacerbation: Secondary | ICD-10-CM

## 2024-01-23 DIAGNOSIS — Z720 Tobacco use: Secondary | ICD-10-CM | POA: Diagnosis not present

## 2024-01-23 DIAGNOSIS — J208 Acute bronchitis due to other specified organisms: Secondary | ICD-10-CM

## 2024-01-23 DIAGNOSIS — J9601 Acute respiratory failure with hypoxia: Secondary | ICD-10-CM | POA: Diagnosis not present

## 2024-01-23 MED ORDER — PREDNISONE 20 MG PO TABS: ORAL_TABLET | ORAL | 0 refills | Status: AC

## 2024-01-23 MED ORDER — LEVALBUTEROL HCL 1.25 MG/0.5ML IN NEBU
1.2500 mg | INHALATION_SOLUTION | Freq: Three times a day (TID) | RESPIRATORY_TRACT | Status: DC
Start: 2024-01-23 — End: 2024-01-23

## 2024-01-23 MED ORDER — IPRATROPIUM BROMIDE 0.02 % IN SOLN: 0.5000 mg | Freq: Three times a day (TID) | RESPIRATORY_TRACT | Status: DC

## 2024-01-23 NOTE — Progress Notes (Signed)
 Mobility Specialist Progress Note:    01/23/24 1010  Mobility  Activity Ambulated with assistance  Level of Assistance Independent  Assistive Device None  Distance Ambulated (ft) 240 ft  Range of Motion/Exercises Active;All extremities  Activity Response Tolerated well  Mobility Referral Yes  Mobility visit 1 Mobility  Mobility Specialist Start Time (ACUTE ONLY) 1010  Mobility Specialist Stop Time (ACUTE ONLY) 1028  Mobility Specialist Time Calculation (min) (ACUTE ONLY) 18 min   Pt received in bed, RN requesting O2 test. Independently able to stand and ambulate with no AD. Tolerated well, SpO2 92% on RA at rest. SpO2 89-93% on RA during ambulation. Returned supine, all needs met.   [name] Mobility Specialist Please contact via Special educational needs teacher or  Rehab office at [phone]

## 2024-01-23 NOTE — Plan of Care (Signed)

## 2024-01-23 NOTE — Progress Notes (Signed)
 SATURATION QUALIFICATIONS: (This note is used to comply with regulatory documentation for home oxygen )  Patient Saturations on Room Air at Rest = 93%  Patient Saturations on Room Air while Ambulating = 89-93%  Patient Saturations on  Liters of oxygen  while Ambulating = 89-93%  Please briefly explain why patient needs home oxygen : patient does not qualify for home o2 at this time since o2 sat remained above 90% while ambulating

## 2024-01-23 NOTE — Discharge Summary (Signed)
 Physician Discharge Summary   Patient: [name] MRN: [id number] DOB: [birth year]  Admit date:     01/21/2024  Discharge date: {dischdate:26783}  Discharge Physician: Concepcion Riser   PCP: [facility]   Recommendations at discharge:  {Tip this will not be part of the note when signed- Example include specific recommendations for outpatient follow-up, pending tests to follow-up on. (Optional):26781}  ***  Discharge Diagnoses: Principal Problem:   Acute bronchitis Active Problems:   Tobacco abuse   Acute asthma exacerbation   Acute respiratory failure with hypoxia (HCC)  Resolved Problems:   * No resolved hospital problems. *  Hospital Course: [name] is a 38 y.o. female with medical history significant of intermittent asthma, recurrent bronchitis, tobacco abuse, history of preeclampsia who presents to the emergency department due to shortness of breath which started yesterday, she complained of productive cough with clear mucus.  Patient complained of worsening shortness of breath on mild exertion such as walking a few steps within the house.  She was at [place]  when symptoms started, she returned home today and went straight to the ED for further evaluation.  She denies fever, nausea and vomiting.  She complained of chest tightness and congestion.   ED Course:  she was tachycardic, tachypneic, temperature 98.8 F, BP 115/86, O2 sat 92% on supplemental oxygen  via [place] at 2 LPM.  Workup in the ED showed normal CBC except for WBC of 16.6.  BMP was normal except for blood glucose of 127.  Troponin x 2 was negative.  Influenza A, B, SARS, RSV, RSV was negative. Chest x-ray showed no active disease She was treated with IV ceftriaxone  and Zithromax  due to presumed CAP.  Breathing treatment was provided, IV Solu-Medrol  125 mg x 1 was given, IV NS 1 L was provided.      Assessment & Plan:   Principal Problem:   Acute bronchitis Active  Problems:   Tobacco abuse   Acute asthma exacerbation   Acute respiratory failure with hypoxia (HCC)     Acute bronchitis with superimposed asthma exacerbation Acute respiratory failure with hypoxia -  - Continue Atrovent , Mucinex , Solu-Medrol , azithromycin . - Continue Protonix  to prevent steroid-induced ulcer - Continue incentive spirometry and flutter valve - Continue supplemental oxygen  to maintain O2 sat > 92% with -  - plan to wean patient off oxygen  as tolerated (patient does not use - oxygen  at baseline)   - Leukocytosis (chronic) WBC 16.6, this was 14.1 about 2 months ago Continue to monitor WBC with morning labs   - Tobacco abuse Patient states that she is trying to abstain from tobacco use.  Last smoking was 2 days ago Patient states that she has a nicotine  patch on Continue nicotine  patch  Assessment and Plan: No notes have been filed under this hospital service. Service: Hospitalist     {Tip this will not be part of the note when signed Body mass index is 20.94 kg/m. 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     Fentanyl     Chest heaviness   Morphine  Itching   Toradol  [ketorolac  Tromethamine ] Other (See Comments)   Anxiety and burning in the stomach   Reglan  [metoclopramide ] Anxiety        Medication List     STOP taking these medications    pantoprazole  40 MG tablet Commonly known as: PROTONIX        TAKE these medications    acetaminophen  500 MG  tablet Commonly known as: TYLENOL  Take 1,000 mg by mouth every 6 (six) hours as needed for mild pain or moderate pain.   ALPRAZolam  1 MG tablet Commonly known as: XANAX  Take 1 mg by mouth at bedtime as needed for anxiety. What changed:  when to take this additional instructions   budesonide -formoterol  160-4.5 MCG/ACT inhaler Commonly known as: Symbicort  Take 2 puffs first thing in am and then another 2 puffs about 12 hours later.   famotidine  20 MG tablet Commonly known as: Pepcid  One after supper What changed:  how much to take how to take this when to take this   ipratropium 0.02 % nebulizer solution Commonly known as: ATROVENT  Take 2.5 mLs (0.5 mg total) by nebulization every 6 (six) hours.   levalbuterol  1.25 MG/0.5ML nebulizer solution Commonly known as: XOPENEX  Take 1.25 mg by nebulization every 6 (six) hours.   nicotine  21 mg/24hr patch Commonly known as: NICODERM CQ  - dosed in mg/24 hours Place 1 patch (21 mg total) onto the skin daily.   Oxycodone  HCl 10 MG Tabs Take 10 mg by mouth in the morning, at noon, in the evening, and at bedtime.   predniSONE  20 MG tablet Commonly known as: DELTASONE  Take 3 tablets (60 mg total) by mouth daily with breakfast for 3 days, THEN 2 tablets (40 mg total) daily with breakfast for 3 days, THEN 1 tablet (20 mg total) daily with breakfast for 3 days. Start taking on: January 23, 2024   promethazine  25 MG tablet Commonly known as: PHENERGAN  Take 1 tablet (25 mg total) by mouth every 6 (six) hours as needed for vomiting or nausea.   Ventolin  HFA 108 (90 Base) MCG/ACT inhaler Generic drug: albuterol  Inhale 1-2 puffs into the lungs every 4 (four) hours as needed for wheezing or shortness of breath.        Discharge Exam: Filed Weights   01/21/24 0019  Weight: 55.3 kg   ***  Condition at discharge: {DC Condition:26389}  The results of significant diagnostics from this hospitalization (including imaging, microbiology,  ancillary and laboratory) are listed below for reference.   Imaging Studies: DG Chest Portable 1 View Result Date: 01/21/2024 CLINICAL DATA:  Shortness of breath and cough. EXAM: PORTABLE CHEST 1 VIEW COMPARISON:  October 27, 2023 FINDINGS: The heart size and mediastinal contours are within normal limits. Both lungs are clear. The visualized skeletal structures are unremarkable. IMPRESSION: No active disease. Electronically Signed   By: Suzen Dials M.D.   On: 01/21/2024 00:48    Microbiology: Results for orders placed or performed during the hospital encounter of 01/21/24  Resp panel by RT-PCR (RSV, Flu A&B, Covid) Anterior Nasal Swab     Status: None   Collection Time: 01/21/24 12:59 AM   Specimen: Anterior Nasal Swab  Result Value Ref Range Status   SARS Coronavirus 2 by RT PCR NEGATIVE NEGATIVE Final    Comment: (NOTE) SARS-CoV-2 target nucleic acids are NOT DETECTED.  The SARS-CoV-2 RNA  is generally detectable in upper respiratory specimens during the acute phase of infection. The lowest concentration of SARS-CoV-2 viral copies this assay can detect is 138 copies/mL. A negative result does not preclude SARS-Cov-2 infection and should not be used as the sole basis for treatment or other patient management decisions. A negative result may occur with  improper specimen collection/handling, submission of specimen other than nasopharyngeal swab, presence of viral mutation(s) within the areas targeted by this assay, and inadequate number of viral copies(<138 copies/mL). A negative result must be combined with clinical observations, patient history, and epidemiological information. The expected result is Negative.  Fact Sheet for Patients:  BloggerCourse.com  Fact Sheet for Healthcare Providers:  SeriousBroker.it  This test is no t yet approved or cleared by the [place]  FDA and  has been authorized for detection and/or  diagnosis of SARS-CoV-2 by FDA under an Emergency Use Authorization (EUA). This EUA will remain  in effect (meaning this test can be used) for the duration of the COVID-19 declaration under Section 564(b)(1) of the Act, 21 U.S.C.section 360bbb-3(b)(1), unless the authorization is terminated  or revoked sooner.       Influenza A by PCR NEGATIVE NEGATIVE Final   Influenza B by PCR NEGATIVE NEGATIVE Final    Comment: (NOTE) The Xpert Xpress SARS-CoV-2/FLU/RSV plus assay is intended as an aid in the diagnosis of influenza from Nasopharyngeal swab specimens and should not be used as a sole basis for treatment. Nasal washings and aspirates are unacceptable for Xpert Xpress SARS-CoV-2/FLU/RSV testing.  Fact Sheet for Patients: BloggerCourse.com  Fact Sheet for Healthcare Providers: SeriousBroker.it  This test is not yet approved or cleared by the [place]  FDA and has been authorized for detection and/or diagnosis of SARS-CoV-2 by FDA under an Emergency Use Authorization (EUA). This EUA will remain in effect (meaning this test can be used) for the duration of the COVID-19 declaration under Section 564(b)(1) of the Act, 21 U.S.C. section 360bbb-3(b)(1), unless the authorization is terminated or revoked.     Resp Syncytial Virus by PCR NEGATIVE NEGATIVE Final    Comment: (NOTE) Fact Sheet for Patients: BloggerCourse.com  Fact Sheet for Healthcare Providers: SeriousBroker.it  This test is not yet approved or cleared by the [place]  FDA and has been authorized for detection and/or diagnosis of SARS-CoV-2 by FDA under an Emergency Use Authorization (EUA). This EUA will remain in effect (meaning this test can be used) for the duration of the COVID-19 declaration under Section 564(b)(1) of the Act, 21 U.S.C. section 360bbb-3(b)(1), unless the authorization is terminated  or revoked.  Performed at [facility], [address] [place], [place] [address]     Labs: CBC: Recent Labs  Lab 01/21/24 0032 01/22/24 0449  WBC 16.6* 13.7*  HGB 13.6 12.2  HCT 40.0 36.6  MCV 93.9 94.8  PLT 346 324   Basic Metabolic Panel: Recent Labs  Lab 01/21/24 0032 01/21/24 0318 01/22/24 0449  NA 137  --  137  K 4.1  --  4.6  CL 100  --  102  CO2 22  --  24  GLUCOSE 127*  --  182*  BUN <5*  --  9  CREATININE 0.80  --  0.76  CALCIUM  9.5  --  9.6  MG  --  2.9*  --   PHOS  --  2.5  --    Liver Function Tests: Recent Labs  Lab 01/22/24 0449  AST 13*  ALT 19  ALKPHOS 84  BILITOT 0.3  PROT 6.7  ALBUMIN 3.9   CBG: No results for input(s): GLUCAP in the last 168 hours.  Discharge time spent: {LESS THAN/GREATER UYJW:73611} 30 minutes.  Signed: Concepcion Riser, MD [facility] 01/23/2024

## 2024-02-16 ENCOUNTER — Ambulatory Visit (HOSPITAL_COMMUNITY): Admission: RE | Admit: 2024-02-16 | Source: Ambulatory Visit

## 2024-03-07 ENCOUNTER — Telehealth: Payer: Self-pay

## 2024-03-23 NOTE — Telephone Encounter (Signed)
 Pt has been scheduled for appt

## 2024-03-28 ENCOUNTER — Ambulatory Visit: Admitting: Internal Medicine

## 2024-04-03 ENCOUNTER — Telehealth: Payer: Self-pay

## 2024-04-03 NOTE — Telephone Encounter (Signed)
 Atc x1 to get pt rescheduled for her appt tomorrow - pt no showed her PFT and was calling to make sure pt is not having any acute symptoms otherwise needs resdling after pft

## 2024-04-04 ENCOUNTER — Ambulatory Visit: Admitting: Internal Medicine

## 2024-04-04 DIAGNOSIS — J4489 Other specified chronic obstructive pulmonary disease: Secondary | ICD-10-CM

## 2024-04-04 NOTE — Progress Notes (Deleted)
 [name], female    DOB: Jun 09, 1985    MRN: [id number]   Brief patient profile:  38   yowf/ MM quit smoking 1st week July 2015  referred to pulmonary clinic in [facility]  11/08/2023 by Amber Lamer PA [facility]  for recurrent AB   Onset problems 2019 dx as pna/ bronchitis rx albuterol  hfa prn and back to baseline s daily saba but then neb dpe p admit in [facility] May 2024 x 9 days but d/c off 02 and fine until  09/10/23 more sob and change in mucus thick and green > admit  Admit date: 10/27/2023 Discharge date: 10/29/2023   Admitted From: Home Disposition: Home   Recommendations for Outpatient Follow-up:  Follow up with PCP in 1-2 weeks Follow-up with pulmonology next week as scheduled   Home Health: None Equipment/Devices: None      Brief/Interim Summary: [name] is a 38 y.o. female with medical history significant of pre-eclampsia, intermittent asthma, recurrent pancreatitis, and tobacco abuse presents with dyspnea and hypoxia.  Patient sent in from PCP office with sats in the low 80s at rest.  Patient indicates her nebulizer recently malfunctioned likely the cause of her current exacerbation, hospitalist called for admission.   Patient admitted as above with acute hypoxic respiratory failure in the setting of asthma exacerbation.  Patient improved quite dramatically  over the past 48 hours   Will discharge patient with new nebulizer as well as will transition her off of DuoNeb onto levalbuterol  to avoid any further tachycardia given patient's symptoms.  Patient did have 2 episodes of what appeared to be nocturnal asthma versus possible underlying sleep apnea.  She woke up both times with air hunger but was not hypoxic.  Today she is ambulating without further hypoxia or symptoms, otherwise stable and agreeable for discharge home.  Will discharge on twice daily inhaled steroid as well as levalbuterol  and ipratropium.  Steroid taper to continue until pulmonology  follow-up.   Discharge Diagnoses:  Principal Problem:   Asthma, chronic, unspecified asthma severity, with acute exacerbation   Leukocytosis   Hyponatremia   Chronic pain syndrome   Tobacco abuse   GERD (gastroesophageal reflux disease)   Acute asthma exacerbation     History of Present Illness  11/08/2023  Pulmonary/ 1st office eval/ [name] / [facility] last few days of pred and now maint on pulmocort 0.5 nebs/duoneb Chief Complaint  Patient presents with   Establish Care  Dyspnea:  housework ok  Cough: minimal clear mucus now Sleep: bed is flat but gets head up 45 degrees up /last episode noct choking on phlegm 2 nights prior to OV  but insists much betternow and no problem with swallowing SABA use: neb q 6-8 hours  02: none  Rec Plan A = Automatic = Always=  Symbicort  160 Take 2 puffs first thing in am and then another 2 puffs about 12 hours later.   Plan B = Backup (to supplement plan A, not to replace it) Use your albuterol  inhaler as a rescue medication  Plan C = Crisis (instead of Plan B but only if Plan B stops working) - only use your levoalbuterol-ipatropim nebulizer if you first try Plan B  Protonix  Take 30-60 min before first meal of the day and pepcid  20 mg one in evening  Please schedule a follow up office visit in 6 weeks, call sooner if needed- bring your inhalers / neb solution   - Allergy screen 11/08/2023 >  Eos 0.0/  IgE 232 on last days of pred taper  - Alpha one AT phenotype MM  level 167   11/08/23      12/28/2023  f/u ov/[place] office/[name] re: AB maint on symbicort  160  / still smoking some  Chief Complaint  Patient presents with   Shortness of Breath  Dyspnea:  house clearing  Cough: minimal mucoid  Sleeping: flat bed 2 pillows no   resp cc  SABA use: 1-3 x per day  02: none  Patient Instructions  Also  Ok to try albuterol  15 min before an activity (on alternating days)  that you know would usually make you short of breath PFTs next  available and ok to use symbicort  that morning         04/04/2024  f/u ov/[place] [facility]/[name] re: *** maint on ***  No chief complaint on file.   Dyspnea:  *** Cough: *** Sleeping: ***   resp cc  SABA use: *** 02: ***  Lung cancer screening: ***   No obvious day to day or daytime variability or assoc excess/ purulent sputum or mucus plugs or hemoptysis or cp or chest tightness, subjective wheeze or overt sinus or hb symptoms.    Also denies any obvious fluctuation of symptoms with weather or environmental changes or other aggravating or alleviating factors except as outlined above   No unusual exposure hx or h/o childhood pna/ asthma or knowledge of premature birth.  Current Allergies, Complete Past Medical History, Past Surgical History, Family History, and Social History were reviewed in [employer] record.  ROS  The following are not active complaints unless bolded Hoarseness, sore throat, dysphagia, dental problems, itching, sneezing,  nasal congestion or discharge of excess mucus or purulent secretions, ear ache,   fever, chills, sweats, unintended wt loss or wt gain, classically pleuritic or exertional cp,  orthopnea pnd or arm/hand swelling  or leg swelling, presyncope, palpitations, abdominal pain, anorexia, nausea, vomiting, diarrhea  or change in bowel habits or change in bladder habits, change in stools or change in urine, dysuria, hematuria,  rash, arthralgias, visual complaints, headache, numbness, weakness or ataxia or problems with walking or coordination,  change in mood or  memory.         Outpatient Medications Prior to Visit  Medication Sig Dispense Refill   acetaminophen  (TYLENOL ) 500 MG tablet Take 1,000 mg by mouth every 6 (six) hours as needed for mild pain or moderate pain.     ALPRAZolam  (XANAX ) 1 MG tablet Take 1 mg by mouth at bedtime as needed for anxiety. (Patient taking differently: Take 1 mg by mouth See admin instructions.  Patient takes 1mg  by mouth daily at bedtime and can take an additional 1mg  dose up to twice daily as needed during the day.)     budesonide -formoterol  (SYMBICORT ) 160-4.5 MCG/ACT inhaler Take 2 puffs first thing in am and then another 2 puffs about 12 hours later. 1 each 12   famotidine  (PEPCID ) 20 MG tablet One after supper (Patient taking differently: Take 20 mg by mouth daily after supper. One after supper) 30 tablet 11   ipratropium (ATROVENT ) 0.02 % nebulizer solution Take 2.5 mLs (0.5 mg total) by nebulization every 6 (six) hours. 300 mL 0   levalbuterol  (XOPENEX ) 1.25 MG/0.5ML nebulizer solution Take 1.25 mg by nebulization every 6 (six) hours. 60 mL 0   nicotine  (NICODERM CQ  - DOSED IN MG/24 HOURS) 21 mg/24hr patch Place 1 patch (21 mg total) onto the skin daily. (  Patient not taking: Reported on 01/21/2024) 28 patch 0   Oxycodone  HCl 10 MG TABS Take 10 mg by mouth in the morning, at noon, in the evening, and at bedtime.     promethazine  (PHENERGAN ) 25 MG tablet Take 1 tablet (25 mg total) by mouth every 6 (six) hours as needed for vomiting or nausea. 30 tablet 0   VENTOLIN  HFA 108 (90 Base) MCG/ACT inhaler Inhale 1-2 puffs into the lungs every 4 (four) hours as needed for wheezing or shortness of breath. 18 g 1   No facility-administered medications prior to visit.       Past Medical History:  Diagnosis Date   Abdominal wall pain    chronic; per [facility] records 07/2012   Anemia    Anxiety    Chronic abdominal pain    Chronic pancreatitis (HCC)    Depression    Gastritis    GERD (gastroesophageal reflux disease)    Headache    migraines   Hemorrhage in pregnancy    History of preterm delivery, currently pregnant in first trimester 05/05/2015   HPV in female    Hypertension    mainly during pregnancy   Nausea & vomiting 05/05/2015   Opiate dependence (HCC) 02/27/2012   Osteomyelitis of leg (HCC)    right tibia, 2009   Pancreatitis    Pancreatitis    Pneumonia    Tobacco  abuse    Vaginal Pap smear, abnormal       Objective:    Wts   04/04/2024       ***   12/28/23 125 lb (56.7 kg)  11/08/23 119 lb (54 kg)  10/27/23 117 lb (53.1 kg)    Vital signs reviewed  04/04/2024  - Note at rest 02 sats  ***% on ***   General appearance:    ***            Assessment             Ozell America, MD 12/28/2023

## 2024-04-04 NOTE — Telephone Encounter (Signed)
 Called to speak with patient regarding the No Show PFT appointment at [facility] and the 04/04/24 missed appointment with Dr. Darlean.  No answer and voice mail is full---will send My Chart message and continue to try and reach patient via phone

## 2024-04-06 NOTE — Telephone Encounter (Signed)
 LVM for patient to call and discuss rescheduling the 12/28/23 missed PFT appointment at [facility] and 04/04/24 missed appointment with Dr. Darlean

## 2024-04-09 ENCOUNTER — Encounter: Payer: Self-pay | Admitting: Internal Medicine

## 2024-04-09 NOTE — Telephone Encounter (Signed)
 Called to discuss scheduling the missed PFT appointment order by dr. Wert----no answer and voice mail is full.  Will mail letter requesting patient call the office

## 2024-05-14 ENCOUNTER — Ambulatory Visit: Admitting: Internal Medicine
